# Patient Record
Sex: Male | Born: 1945 | Race: White | Hispanic: No | Marital: Married | Smoking: Never smoker
Health system: Southern US, Community
[De-identification: ages and names within clinical notes are randomized; demographics above are authoritative.]

## PROBLEM LIST (undated history)

## (undated) DIAGNOSIS — G43909 Migraine, unspecified, not intractable, without status migrainosus: Secondary | ICD-10-CM

## (undated) DIAGNOSIS — M7752 Other enthesopathy of left foot: Secondary | ICD-10-CM

## (undated) DIAGNOSIS — C801 Malignant (primary) neoplasm, unspecified: Secondary | ICD-10-CM

## (undated) DIAGNOSIS — Z87442 Personal history of urinary calculi: Secondary | ICD-10-CM

## (undated) DIAGNOSIS — T753XXA Motion sickness, initial encounter: Secondary | ICD-10-CM

## (undated) DIAGNOSIS — B009 Herpesviral infection, unspecified: Secondary | ICD-10-CM

## (undated) DIAGNOSIS — I10 Essential (primary) hypertension: Secondary | ICD-10-CM

## (undated) DIAGNOSIS — N400 Enlarged prostate without lower urinary tract symptoms: Secondary | ICD-10-CM

## (undated) DIAGNOSIS — Z8614 Personal history of Methicillin resistant Staphylococcus aureus infection: Secondary | ICD-10-CM

## (undated) DIAGNOSIS — N200 Calculus of kidney: Secondary | ICD-10-CM

## (undated) DIAGNOSIS — M1712 Unilateral primary osteoarthritis, left knee: Secondary | ICD-10-CM

## (undated) DIAGNOSIS — T4145XA Adverse effect of unspecified anesthetic, initial encounter: Secondary | ICD-10-CM

## (undated) DIAGNOSIS — F32A Depression, unspecified: Secondary | ICD-10-CM

## (undated) DIAGNOSIS — L570 Actinic keratosis: Secondary | ICD-10-CM

## (undated) DIAGNOSIS — M1990 Unspecified osteoarthritis, unspecified site: Secondary | ICD-10-CM

## (undated) DIAGNOSIS — T8859XA Other complications of anesthesia, initial encounter: Secondary | ICD-10-CM

## (undated) DIAGNOSIS — Z9889 Other specified postprocedural states: Secondary | ICD-10-CM

## (undated) DIAGNOSIS — M48061 Spinal stenosis, lumbar region without neurogenic claudication: Secondary | ICD-10-CM

## (undated) DIAGNOSIS — R112 Nausea with vomiting, unspecified: Secondary | ICD-10-CM

## (undated) DIAGNOSIS — F329 Major depressive disorder, single episode, unspecified: Secondary | ICD-10-CM

## (undated) HISTORY — DX: Malignant (primary) neoplasm, unspecified: C80.1

## (undated) HISTORY — DX: Other specified postprocedural states: Z98.890

## (undated) HISTORY — PX: ELBOW SURGERY: SHX618

## (undated) HISTORY — DX: Herpesviral infection, unspecified: B00.9

## (undated) HISTORY — PX: TONSILLECTOMY: SUR1361

## (undated) HISTORY — DX: Actinic keratosis: L57.0

## (undated) HISTORY — PX: BACK SURGERY: SHX140

## (undated) HISTORY — DX: Essential (primary) hypertension: I10

## (undated) HISTORY — DX: Personal history of Methicillin resistant Staphylococcus aureus infection: Z86.14

## (undated) HISTORY — DX: Calculus of kidney: N20.0

## (undated) HISTORY — DX: Other enthesopathy of left foot and ankle: M77.52

## (undated) HISTORY — PX: NECK SURGERY: SHX720

## (undated) HISTORY — PX: APPENDECTOMY: SHX54

---

## 1999-11-09 DIAGNOSIS — I10 Essential (primary) hypertension: Secondary | ICD-10-CM | POA: Insufficient documentation

## 2002-11-08 DIAGNOSIS — Z8614 Personal history of Methicillin resistant Staphylococcus aureus infection: Secondary | ICD-10-CM

## 2002-11-08 HISTORY — DX: Personal history of Methicillin resistant Staphylococcus aureus infection: Z86.14

## 2007-09-15 ENCOUNTER — Ambulatory Visit: Payer: Self-pay | Admitting: Urology

## 2008-01-12 DIAGNOSIS — L0202 Furuncle of face: Secondary | ICD-10-CM | POA: Insufficient documentation

## 2008-11-08 HISTORY — PX: OTHER SURGICAL HISTORY: SHX169

## 2008-12-03 DIAGNOSIS — M545 Low back pain, unspecified: Secondary | ICD-10-CM | POA: Insufficient documentation

## 2008-12-03 DIAGNOSIS — M5417 Radiculopathy, lumbosacral region: Secondary | ICD-10-CM | POA: Insufficient documentation

## 2008-12-03 DIAGNOSIS — M5416 Radiculopathy, lumbar region: Secondary | ICD-10-CM | POA: Insufficient documentation

## 2009-01-01 ENCOUNTER — Ambulatory Visit: Payer: Self-pay | Admitting: Specialist

## 2009-02-17 ENCOUNTER — Ambulatory Visit: Payer: Self-pay | Admitting: Anesthesiology

## 2009-02-28 ENCOUNTER — Ambulatory Visit: Payer: Self-pay | Admitting: Anesthesiology

## 2009-02-28 ENCOUNTER — Ambulatory Visit: Payer: Self-pay | Admitting: Family Medicine

## 2009-03-28 ENCOUNTER — Ambulatory Visit: Payer: Self-pay | Admitting: Anesthesiology

## 2009-05-08 DIAGNOSIS — Z8619 Personal history of other infectious and parasitic diseases: Secondary | ICD-10-CM | POA: Insufficient documentation

## 2009-05-08 DIAGNOSIS — B009 Herpesviral infection, unspecified: Secondary | ICD-10-CM | POA: Insufficient documentation

## 2009-06-09 ENCOUNTER — Ambulatory Visit: Payer: Self-pay | Admitting: Anesthesiology

## 2009-08-11 ENCOUNTER — Ambulatory Visit: Payer: Self-pay | Admitting: Anesthesiology

## 2009-09-12 ENCOUNTER — Ambulatory Visit: Payer: Self-pay | Admitting: Anesthesiology

## 2009-11-10 ENCOUNTER — Ambulatory Visit: Payer: Self-pay | Admitting: Anesthesiology

## 2009-12-09 ENCOUNTER — Ambulatory Visit: Payer: Self-pay | Admitting: Anesthesiology

## 2010-01-12 ENCOUNTER — Ambulatory Visit: Payer: Self-pay | Admitting: Anesthesiology

## 2010-02-06 ENCOUNTER — Ambulatory Visit: Payer: Self-pay | Admitting: Anesthesiology

## 2010-03-19 ENCOUNTER — Ambulatory Visit: Payer: Self-pay | Admitting: Anesthesiology

## 2010-05-05 ENCOUNTER — Inpatient Hospital Stay (HOSPITAL_COMMUNITY): Admission: RE | Admit: 2010-05-05 | Discharge: 2010-05-08 | Payer: Self-pay | Admitting: Neurosurgery

## 2011-01-24 LAB — TYPE AND SCREEN: ABO/RH(D): O POS

## 2011-01-24 LAB — URINALYSIS, ROUTINE W REFLEX MICROSCOPIC
Hgb urine dipstick: NEGATIVE
Nitrite: NEGATIVE
Specific Gravity, Urine: 1.023 (ref 1.005–1.030)

## 2011-01-24 LAB — CBC
MCV: 96.7 fL (ref 78.0–100.0)
WBC: 6.6 10*3/uL (ref 4.0–10.5)

## 2011-01-24 LAB — COMPREHENSIVE METABOLIC PANEL
AST: 20 U/L (ref 0–37)
Alkaline Phosphatase: 65 U/L (ref 39–117)
Chloride: 102 mEq/L (ref 96–112)
Glucose, Bld: 106 mg/dL — ABNORMAL HIGH (ref 70–99)
Sodium: 139 mEq/L (ref 135–145)
Total Bilirubin: 0.8 mg/dL (ref 0.3–1.2)

## 2011-01-24 LAB — SURGICAL PCR SCREEN
MRSA, PCR: NEGATIVE
Staphylococcus aureus: NEGATIVE

## 2011-01-24 LAB — APTT: aPTT: 29 seconds (ref 24–37)

## 2011-01-24 LAB — ABO/RH: ABO/RH(D): O POS

## 2011-01-24 LAB — DIFFERENTIAL
Basophils Relative: 1 % (ref 0–1)
Lymphs Abs: 2.2 10*3/uL (ref 0.7–4.0)
Monocytes Absolute: 0.6 10*3/uL (ref 0.1–1.0)

## 2011-01-24 LAB — PROTIME-INR: Prothrombin Time: 12.4 seconds (ref 11.6–15.2)

## 2011-06-03 ENCOUNTER — Ambulatory Visit: Payer: Self-pay | Admitting: Specialist

## 2011-09-11 DIAGNOSIS — R519 Headache, unspecified: Secondary | ICD-10-CM | POA: Insufficient documentation

## 2011-11-03 ENCOUNTER — Ambulatory Visit: Payer: Self-pay | Admitting: Specialist

## 2011-11-10 ENCOUNTER — Ambulatory Visit: Payer: Self-pay | Admitting: Specialist

## 2011-11-10 DIAGNOSIS — N400 Enlarged prostate without lower urinary tract symptoms: Secondary | ICD-10-CM | POA: Diagnosis not present

## 2011-11-10 DIAGNOSIS — M25819 Other specified joint disorders, unspecified shoulder: Secondary | ICD-10-CM | POA: Diagnosis not present

## 2011-11-10 DIAGNOSIS — Z5331 Laparoscopic surgical procedure converted to open procedure: Secondary | ICD-10-CM | POA: Diagnosis not present

## 2011-11-10 DIAGNOSIS — Z8614 Personal history of Methicillin resistant Staphylococcus aureus infection: Secondary | ICD-10-CM | POA: Diagnosis not present

## 2011-11-10 DIAGNOSIS — R42 Dizziness and giddiness: Secondary | ICD-10-CM | POA: Diagnosis not present

## 2011-11-10 DIAGNOSIS — M19019 Primary osteoarthritis, unspecified shoulder: Secondary | ICD-10-CM | POA: Diagnosis not present

## 2011-11-10 DIAGNOSIS — I10 Essential (primary) hypertension: Secondary | ICD-10-CM | POA: Diagnosis not present

## 2011-11-10 DIAGNOSIS — M75100 Unspecified rotator cuff tear or rupture of unspecified shoulder, not specified as traumatic: Secondary | ICD-10-CM | POA: Diagnosis not present

## 2011-11-10 DIAGNOSIS — Z79899 Other long term (current) drug therapy: Secondary | ICD-10-CM | POA: Diagnosis not present

## 2011-11-10 DIAGNOSIS — Z7982 Long term (current) use of aspirin: Secondary | ICD-10-CM | POA: Diagnosis not present

## 2011-11-10 HISTORY — PX: SHOULDER SURGERY: SHX246

## 2011-11-17 DIAGNOSIS — L570 Actinic keratosis: Secondary | ICD-10-CM | POA: Diagnosis not present

## 2011-11-23 DIAGNOSIS — M75100 Unspecified rotator cuff tear or rupture of unspecified shoulder, not specified as traumatic: Secondary | ICD-10-CM | POA: Diagnosis not present

## 2011-11-23 DIAGNOSIS — M19019 Primary osteoarthritis, unspecified shoulder: Secondary | ICD-10-CM | POA: Diagnosis not present

## 2011-11-26 DIAGNOSIS — M19019 Primary osteoarthritis, unspecified shoulder: Secondary | ICD-10-CM | POA: Diagnosis not present

## 2011-11-26 DIAGNOSIS — M75100 Unspecified rotator cuff tear or rupture of unspecified shoulder, not specified as traumatic: Secondary | ICD-10-CM | POA: Diagnosis not present

## 2011-11-30 DIAGNOSIS — M75100 Unspecified rotator cuff tear or rupture of unspecified shoulder, not specified as traumatic: Secondary | ICD-10-CM | POA: Diagnosis not present

## 2011-11-30 DIAGNOSIS — M19019 Primary osteoarthritis, unspecified shoulder: Secondary | ICD-10-CM | POA: Diagnosis not present

## 2011-12-03 DIAGNOSIS — M75100 Unspecified rotator cuff tear or rupture of unspecified shoulder, not specified as traumatic: Secondary | ICD-10-CM | POA: Diagnosis not present

## 2011-12-03 DIAGNOSIS — M19019 Primary osteoarthritis, unspecified shoulder: Secondary | ICD-10-CM | POA: Diagnosis not present

## 2011-12-06 DIAGNOSIS — M75100 Unspecified rotator cuff tear or rupture of unspecified shoulder, not specified as traumatic: Secondary | ICD-10-CM | POA: Diagnosis not present

## 2011-12-06 DIAGNOSIS — M19019 Primary osteoarthritis, unspecified shoulder: Secondary | ICD-10-CM | POA: Diagnosis not present

## 2011-12-10 DIAGNOSIS — M19019 Primary osteoarthritis, unspecified shoulder: Secondary | ICD-10-CM | POA: Diagnosis not present

## 2011-12-10 DIAGNOSIS — M75100 Unspecified rotator cuff tear or rupture of unspecified shoulder, not specified as traumatic: Secondary | ICD-10-CM | POA: Diagnosis not present

## 2011-12-13 DIAGNOSIS — M75100 Unspecified rotator cuff tear or rupture of unspecified shoulder, not specified as traumatic: Secondary | ICD-10-CM | POA: Diagnosis not present

## 2011-12-13 DIAGNOSIS — M19019 Primary osteoarthritis, unspecified shoulder: Secondary | ICD-10-CM | POA: Diagnosis not present

## 2011-12-17 DIAGNOSIS — M75100 Unspecified rotator cuff tear or rupture of unspecified shoulder, not specified as traumatic: Secondary | ICD-10-CM | POA: Diagnosis not present

## 2011-12-17 DIAGNOSIS — M19019 Primary osteoarthritis, unspecified shoulder: Secondary | ICD-10-CM | POA: Diagnosis not present

## 2011-12-20 DIAGNOSIS — L570 Actinic keratosis: Secondary | ICD-10-CM | POA: Diagnosis not present

## 2011-12-21 DIAGNOSIS — M19019 Primary osteoarthritis, unspecified shoulder: Secondary | ICD-10-CM | POA: Diagnosis not present

## 2011-12-21 DIAGNOSIS — M75100 Unspecified rotator cuff tear or rupture of unspecified shoulder, not specified as traumatic: Secondary | ICD-10-CM | POA: Diagnosis not present

## 2011-12-22 DIAGNOSIS — S32009A Unspecified fracture of unspecified lumbar vertebra, initial encounter for closed fracture: Secondary | ICD-10-CM | POA: Diagnosis not present

## 2011-12-22 DIAGNOSIS — M545 Low back pain, unspecified: Secondary | ICD-10-CM | POA: Diagnosis not present

## 2011-12-22 DIAGNOSIS — I10 Essential (primary) hypertension: Secondary | ICD-10-CM | POA: Diagnosis not present

## 2011-12-22 DIAGNOSIS — Z23 Encounter for immunization: Secondary | ICD-10-CM | POA: Diagnosis not present

## 2011-12-22 DIAGNOSIS — G4700 Insomnia, unspecified: Secondary | ICD-10-CM | POA: Diagnosis not present

## 2011-12-24 DIAGNOSIS — M19019 Primary osteoarthritis, unspecified shoulder: Secondary | ICD-10-CM | POA: Diagnosis not present

## 2011-12-24 DIAGNOSIS — M75100 Unspecified rotator cuff tear or rupture of unspecified shoulder, not specified as traumatic: Secondary | ICD-10-CM | POA: Diagnosis not present

## 2011-12-27 DIAGNOSIS — M19019 Primary osteoarthritis, unspecified shoulder: Secondary | ICD-10-CM | POA: Diagnosis not present

## 2011-12-27 DIAGNOSIS — M75100 Unspecified rotator cuff tear or rupture of unspecified shoulder, not specified as traumatic: Secondary | ICD-10-CM | POA: Diagnosis not present

## 2011-12-30 DIAGNOSIS — M19019 Primary osteoarthritis, unspecified shoulder: Secondary | ICD-10-CM | POA: Diagnosis not present

## 2011-12-30 DIAGNOSIS — M75100 Unspecified rotator cuff tear or rupture of unspecified shoulder, not specified as traumatic: Secondary | ICD-10-CM | POA: Diagnosis not present

## 2012-01-12 DIAGNOSIS — M19019 Primary osteoarthritis, unspecified shoulder: Secondary | ICD-10-CM | POA: Diagnosis not present

## 2012-01-12 DIAGNOSIS — M75100 Unspecified rotator cuff tear or rupture of unspecified shoulder, not specified as traumatic: Secondary | ICD-10-CM | POA: Diagnosis not present

## 2012-01-31 DIAGNOSIS — L820 Inflamed seborrheic keratosis: Secondary | ICD-10-CM | POA: Diagnosis not present

## 2012-01-31 DIAGNOSIS — L570 Actinic keratosis: Secondary | ICD-10-CM | POA: Diagnosis not present

## 2012-02-14 DIAGNOSIS — N390 Urinary tract infection, site not specified: Secondary | ICD-10-CM | POA: Diagnosis not present

## 2012-02-14 DIAGNOSIS — R300 Dysuria: Secondary | ICD-10-CM | POA: Diagnosis not present

## 2012-02-14 DIAGNOSIS — R110 Nausea: Secondary | ICD-10-CM | POA: Diagnosis not present

## 2012-02-14 DIAGNOSIS — Z23 Encounter for immunization: Secondary | ICD-10-CM | POA: Diagnosis not present

## 2012-02-23 DIAGNOSIS — F329 Major depressive disorder, single episode, unspecified: Secondary | ICD-10-CM | POA: Diagnosis not present

## 2012-02-23 DIAGNOSIS — L570 Actinic keratosis: Secondary | ICD-10-CM | POA: Diagnosis not present

## 2012-02-23 DIAGNOSIS — Z23 Encounter for immunization: Secondary | ICD-10-CM | POA: Diagnosis not present

## 2012-02-23 DIAGNOSIS — G4700 Insomnia, unspecified: Secondary | ICD-10-CM | POA: Diagnosis not present

## 2012-02-27 DIAGNOSIS — F3289 Other specified depressive episodes: Secondary | ICD-10-CM | POA: Diagnosis not present

## 2012-02-27 DIAGNOSIS — F489 Nonpsychotic mental disorder, unspecified: Secondary | ICD-10-CM | POA: Diagnosis not present

## 2012-02-27 LAB — URINALYSIS, COMPLETE
Bacteria: NONE SEEN
Bilirubin,UR: NEGATIVE
Glucose,UR: NEGATIVE mg/dL (ref 0–75)
Ketone: NEGATIVE
RBC,UR: NONE SEEN /HPF (ref 0–5)
Specific Gravity: 1.008 (ref 1.003–1.030)
Squamous Epithelial: NONE SEEN
WBC UR: <1 /HPF (ref 0–5)

## 2012-02-27 LAB — CBC
HCT: 44.9 % (ref 40.0–52.0)
MCH: 31.7 pg (ref 26.0–34.0)
MCHC: 33.6 g/dL (ref 32.0–36.0)
MCV: 94 fL (ref 80–100)
Platelet: 274 10*3/uL (ref 150–440)
RDW: 13 % (ref 11.5–14.5)
WBC: 7.4 10*3/uL (ref 3.8–10.6)

## 2012-02-27 LAB — ETHANOL
Ethanol %: <0.003 % (ref 0.000–0.080)
Ethanol: <3 mg/dL

## 2012-02-27 LAB — COMPREHENSIVE METABOLIC PANEL
Alkaline Phosphatase: 83 U/L (ref 50–136)
Anion Gap: 7 (ref 7–16)
Calcium, Total: 9.1 mg/dL (ref 8.5–10.1)
Co2: 28 mmol/L (ref 21–32)
EGFR (African American): >60
EGFR (Non-African Amer.): >60
Glucose: 100 mg/dL — ABNORMAL HIGH (ref 65–99)
Osmolality: 275 (ref 275–301)
Sodium: 137 mmol/L (ref 136–145)
Total Protein: 7.1 g/dL (ref 6.4–8.2)

## 2012-02-27 LAB — SALICYLATE LEVEL: Salicylates, Serum: <1.7 mg/dL

## 2012-02-27 LAB — DRUG SCREEN, URINE
Barbiturates, Ur Screen: NEGATIVE (ref ?–200)
Cannabinoid 50 Ng, Ur ~~LOC~~: NEGATIVE (ref ?–50)
Cocaine Metabolite,Ur ~~LOC~~: NEGATIVE (ref ?–300)
Methadone, Ur Screen: NEGATIVE (ref ?–300)

## 2012-02-27 LAB — TSH: Thyroid Stimulating Horm: 1.02 u[IU]/mL

## 2012-02-28 ENCOUNTER — Inpatient Hospital Stay: Payer: Self-pay | Admitting: Psychiatry

## 2012-02-28 DIAGNOSIS — F411 Generalized anxiety disorder: Secondary | ICD-10-CM | POA: Diagnosis not present

## 2012-02-28 DIAGNOSIS — F489 Nonpsychotic mental disorder, unspecified: Secondary | ICD-10-CM | POA: Diagnosis not present

## 2012-02-28 DIAGNOSIS — R45851 Suicidal ideations: Secondary | ICD-10-CM | POA: Diagnosis not present

## 2012-02-28 DIAGNOSIS — R339 Retention of urine, unspecified: Secondary | ICD-10-CM | POA: Diagnosis present

## 2012-02-28 DIAGNOSIS — G8929 Other chronic pain: Secondary | ICD-10-CM | POA: Diagnosis present

## 2012-02-28 DIAGNOSIS — Z880 Allergy status to penicillin: Secondary | ICD-10-CM | POA: Diagnosis not present

## 2012-02-28 DIAGNOSIS — F329 Major depressive disorder, single episode, unspecified: Secondary | ICD-10-CM | POA: Diagnosis not present

## 2012-02-28 DIAGNOSIS — I10 Essential (primary) hypertension: Secondary | ICD-10-CM | POA: Diagnosis not present

## 2012-02-28 DIAGNOSIS — Z9889 Other specified postprocedural states: Secondary | ICD-10-CM | POA: Diagnosis not present

## 2012-02-28 DIAGNOSIS — G4700 Insomnia, unspecified: Secondary | ICD-10-CM | POA: Diagnosis present

## 2012-02-28 DIAGNOSIS — F064 Anxiety disorder due to known physiological condition: Secondary | ICD-10-CM | POA: Diagnosis not present

## 2012-02-28 DIAGNOSIS — Z79899 Other long term (current) drug therapy: Secondary | ICD-10-CM | POA: Diagnosis not present

## 2012-02-28 DIAGNOSIS — B009 Herpesviral infection, unspecified: Secondary | ICD-10-CM | POA: Diagnosis not present

## 2012-02-28 DIAGNOSIS — N400 Enlarged prostate without lower urinary tract symptoms: Secondary | ICD-10-CM | POA: Diagnosis not present

## 2012-02-28 DIAGNOSIS — Z8781 Personal history of (healed) traumatic fracture: Secondary | ICD-10-CM | POA: Diagnosis not present

## 2012-02-28 DIAGNOSIS — F3289 Other specified depressive episodes: Secondary | ICD-10-CM | POA: Diagnosis not present

## 2012-03-07 DIAGNOSIS — N3010 Interstitial cystitis (chronic) without hematuria: Secondary | ICD-10-CM | POA: Diagnosis not present

## 2012-03-07 DIAGNOSIS — E291 Testicular hypofunction: Secondary | ICD-10-CM | POA: Diagnosis not present

## 2012-03-07 DIAGNOSIS — N410 Acute prostatitis: Secondary | ICD-10-CM | POA: Diagnosis not present

## 2012-03-07 DIAGNOSIS — L821 Other seborrheic keratosis: Secondary | ICD-10-CM | POA: Diagnosis not present

## 2012-03-07 DIAGNOSIS — L570 Actinic keratosis: Secondary | ICD-10-CM | POA: Diagnosis not present

## 2012-03-07 DIAGNOSIS — R338 Other retention of urine: Secondary | ICD-10-CM | POA: Diagnosis not present

## 2012-03-07 DIAGNOSIS — Z79899 Other long term (current) drug therapy: Secondary | ICD-10-CM | POA: Diagnosis not present

## 2012-03-07 DIAGNOSIS — N411 Chronic prostatitis: Secondary | ICD-10-CM | POA: Diagnosis not present

## 2012-03-07 DIAGNOSIS — Z125 Encounter for screening for malignant neoplasm of prostate: Secondary | ICD-10-CM | POA: Diagnosis not present

## 2012-03-07 DIAGNOSIS — Z85828 Personal history of other malignant neoplasm of skin: Secondary | ICD-10-CM | POA: Diagnosis not present

## 2012-03-08 DIAGNOSIS — F329 Major depressive disorder, single episode, unspecified: Secondary | ICD-10-CM | POA: Diagnosis not present

## 2012-03-23 DIAGNOSIS — N321 Vesicointestinal fistula: Secondary | ICD-10-CM | POA: Diagnosis not present

## 2012-03-23 DIAGNOSIS — N400 Enlarged prostate without lower urinary tract symptoms: Secondary | ICD-10-CM | POA: Diagnosis not present

## 2012-03-27 DIAGNOSIS — F329 Major depressive disorder, single episode, unspecified: Secondary | ICD-10-CM | POA: Diagnosis not present

## 2012-04-25 DIAGNOSIS — F329 Major depressive disorder, single episode, unspecified: Secondary | ICD-10-CM | POA: Diagnosis not present

## 2012-05-19 DIAGNOSIS — N411 Chronic prostatitis: Secondary | ICD-10-CM | POA: Diagnosis not present

## 2012-05-19 DIAGNOSIS — R351 Nocturia: Secondary | ICD-10-CM | POA: Diagnosis not present

## 2012-05-19 DIAGNOSIS — N401 Enlarged prostate with lower urinary tract symptoms: Secondary | ICD-10-CM | POA: Diagnosis not present

## 2012-05-19 DIAGNOSIS — R350 Frequency of micturition: Secondary | ICD-10-CM | POA: Diagnosis not present

## 2012-06-20 DIAGNOSIS — F329 Major depressive disorder, single episode, unspecified: Secondary | ICD-10-CM | POA: Diagnosis not present

## 2012-06-26 DIAGNOSIS — L820 Inflamed seborrheic keratosis: Secondary | ICD-10-CM | POA: Diagnosis not present

## 2012-06-26 DIAGNOSIS — Z85828 Personal history of other malignant neoplasm of skin: Secondary | ICD-10-CM | POA: Diagnosis not present

## 2012-06-26 DIAGNOSIS — L570 Actinic keratosis: Secondary | ICD-10-CM | POA: Diagnosis not present

## 2012-06-26 DIAGNOSIS — L821 Other seborrheic keratosis: Secondary | ICD-10-CM | POA: Diagnosis not present

## 2012-06-26 DIAGNOSIS — L578 Other skin changes due to chronic exposure to nonionizing radiation: Secondary | ICD-10-CM | POA: Diagnosis not present

## 2012-08-09 DIAGNOSIS — M549 Dorsalgia, unspecified: Secondary | ICD-10-CM | POA: Diagnosis not present

## 2012-08-09 DIAGNOSIS — M542 Cervicalgia: Secondary | ICD-10-CM | POA: Diagnosis not present

## 2012-08-09 DIAGNOSIS — I10 Essential (primary) hypertension: Secondary | ICD-10-CM | POA: Diagnosis not present

## 2012-08-09 DIAGNOSIS — Z23 Encounter for immunization: Secondary | ICD-10-CM | POA: Diagnosis not present

## 2012-09-26 DIAGNOSIS — L578 Other skin changes due to chronic exposure to nonionizing radiation: Secondary | ICD-10-CM | POA: Diagnosis not present

## 2012-09-26 DIAGNOSIS — L819 Disorder of pigmentation, unspecified: Secondary | ICD-10-CM | POA: Diagnosis not present

## 2012-09-26 DIAGNOSIS — L821 Other seborrheic keratosis: Secondary | ICD-10-CM | POA: Diagnosis not present

## 2012-09-26 DIAGNOSIS — Z85828 Personal history of other malignant neoplasm of skin: Secondary | ICD-10-CM | POA: Diagnosis not present

## 2012-09-26 DIAGNOSIS — L570 Actinic keratosis: Secondary | ICD-10-CM | POA: Diagnosis not present

## 2012-09-26 DIAGNOSIS — L820 Inflamed seborrheic keratosis: Secondary | ICD-10-CM | POA: Diagnosis not present

## 2012-10-16 DIAGNOSIS — L570 Actinic keratosis: Secondary | ICD-10-CM | POA: Diagnosis not present

## 2012-11-13 ENCOUNTER — Ambulatory Visit: Payer: Self-pay | Admitting: Gastroenterology

## 2012-11-13 DIAGNOSIS — Z1211 Encounter for screening for malignant neoplasm of colon: Secondary | ICD-10-CM | POA: Diagnosis not present

## 2012-11-13 DIAGNOSIS — N400 Enlarged prostate without lower urinary tract symptoms: Secondary | ICD-10-CM | POA: Diagnosis not present

## 2012-11-13 DIAGNOSIS — M1990 Unspecified osteoarthritis, unspecified site: Secondary | ICD-10-CM | POA: Diagnosis not present

## 2012-11-13 DIAGNOSIS — I10 Essential (primary) hypertension: Secondary | ICD-10-CM | POA: Diagnosis not present

## 2012-11-13 DIAGNOSIS — Z79899 Other long term (current) drug therapy: Secondary | ICD-10-CM | POA: Diagnosis not present

## 2012-11-13 LAB — HM COLONOSCOPY: HM COLON: NORMAL

## 2012-11-15 DIAGNOSIS — Z23 Encounter for immunization: Secondary | ICD-10-CM | POA: Diagnosis not present

## 2012-11-15 DIAGNOSIS — Z0000 Encounter for general adult medical examination without abnormal findings: Secondary | ICD-10-CM | POA: Diagnosis not present

## 2012-11-15 DIAGNOSIS — M549 Dorsalgia, unspecified: Secondary | ICD-10-CM | POA: Diagnosis not present

## 2012-11-16 DIAGNOSIS — E785 Hyperlipidemia, unspecified: Secondary | ICD-10-CM | POA: Diagnosis not present

## 2012-11-16 DIAGNOSIS — Z125 Encounter for screening for malignant neoplasm of prostate: Secondary | ICD-10-CM | POA: Diagnosis not present

## 2012-11-16 DIAGNOSIS — I10 Essential (primary) hypertension: Secondary | ICD-10-CM | POA: Diagnosis not present

## 2012-11-17 DIAGNOSIS — L570 Actinic keratosis: Secondary | ICD-10-CM | POA: Diagnosis not present

## 2012-11-23 DIAGNOSIS — M75100 Unspecified rotator cuff tear or rupture of unspecified shoulder, not specified as traumatic: Secondary | ICD-10-CM | POA: Diagnosis not present

## 2012-12-14 DIAGNOSIS — N411 Chronic prostatitis: Secondary | ICD-10-CM | POA: Diagnosis not present

## 2012-12-14 DIAGNOSIS — R351 Nocturia: Secondary | ICD-10-CM | POA: Diagnosis not present

## 2012-12-14 DIAGNOSIS — N400 Enlarged prostate without lower urinary tract symptoms: Secondary | ICD-10-CM | POA: Insufficient documentation

## 2012-12-14 DIAGNOSIS — R3915 Urgency of urination: Secondary | ICD-10-CM | POA: Diagnosis not present

## 2012-12-14 DIAGNOSIS — N401 Enlarged prostate with lower urinary tract symptoms: Secondary | ICD-10-CM | POA: Diagnosis not present

## 2013-01-29 DIAGNOSIS — L820 Inflamed seborrheic keratosis: Secondary | ICD-10-CM | POA: Diagnosis not present

## 2013-01-29 DIAGNOSIS — L909 Atrophic disorder of skin, unspecified: Secondary | ICD-10-CM | POA: Diagnosis not present

## 2013-01-29 DIAGNOSIS — L919 Hypertrophic disorder of the skin, unspecified: Secondary | ICD-10-CM | POA: Diagnosis not present

## 2013-01-29 DIAGNOSIS — Z85828 Personal history of other malignant neoplasm of skin: Secondary | ICD-10-CM | POA: Diagnosis not present

## 2013-01-29 DIAGNOSIS — L821 Other seborrheic keratosis: Secondary | ICD-10-CM | POA: Diagnosis not present

## 2013-01-29 DIAGNOSIS — L570 Actinic keratosis: Secondary | ICD-10-CM | POA: Diagnosis not present

## 2013-01-29 DIAGNOSIS — L578 Other skin changes due to chronic exposure to nonionizing radiation: Secondary | ICD-10-CM | POA: Diagnosis not present

## 2013-05-18 DIAGNOSIS — M719 Bursopathy, unspecified: Secondary | ICD-10-CM | POA: Diagnosis not present

## 2013-05-18 DIAGNOSIS — M75100 Unspecified rotator cuff tear or rupture of unspecified shoulder, not specified as traumatic: Secondary | ICD-10-CM | POA: Diagnosis not present

## 2013-05-18 DIAGNOSIS — M67919 Unspecified disorder of synovium and tendon, unspecified shoulder: Secondary | ICD-10-CM | POA: Diagnosis not present

## 2013-07-17 DIAGNOSIS — L821 Other seborrheic keratosis: Secondary | ICD-10-CM | POA: Diagnosis not present

## 2013-07-17 DIAGNOSIS — L578 Other skin changes due to chronic exposure to nonionizing radiation: Secondary | ICD-10-CM | POA: Diagnosis not present

## 2013-07-17 DIAGNOSIS — Z85828 Personal history of other malignant neoplasm of skin: Secondary | ICD-10-CM | POA: Diagnosis not present

## 2013-07-17 DIAGNOSIS — L819 Disorder of pigmentation, unspecified: Secondary | ICD-10-CM | POA: Diagnosis not present

## 2013-07-17 DIAGNOSIS — L570 Actinic keratosis: Secondary | ICD-10-CM | POA: Diagnosis not present

## 2013-07-17 DIAGNOSIS — L280 Lichen simplex chronicus: Secondary | ICD-10-CM | POA: Diagnosis not present

## 2013-07-26 DIAGNOSIS — H2510 Age-related nuclear cataract, unspecified eye: Secondary | ICD-10-CM | POA: Diagnosis not present

## 2013-07-26 DIAGNOSIS — H35039 Hypertensive retinopathy, unspecified eye: Secondary | ICD-10-CM | POA: Diagnosis not present

## 2013-07-31 DIAGNOSIS — I10 Essential (primary) hypertension: Secondary | ICD-10-CM | POA: Diagnosis not present

## 2013-07-31 DIAGNOSIS — Z23 Encounter for immunization: Secondary | ICD-10-CM | POA: Diagnosis not present

## 2013-07-31 DIAGNOSIS — M549 Dorsalgia, unspecified: Secondary | ICD-10-CM | POA: Diagnosis not present

## 2013-08-28 DIAGNOSIS — L570 Actinic keratosis: Secondary | ICD-10-CM | POA: Diagnosis not present

## 2013-09-25 DIAGNOSIS — L570 Actinic keratosis: Secondary | ICD-10-CM | POA: Diagnosis not present

## 2013-10-22 LAB — HM HEPATITIS C SCREENING LAB: HM HEPATITIS C SCREENING: NEGATIVE

## 2013-11-09 DIAGNOSIS — Z85828 Personal history of other malignant neoplasm of skin: Secondary | ICD-10-CM | POA: Diagnosis not present

## 2013-11-09 DIAGNOSIS — L578 Other skin changes due to chronic exposure to nonionizing radiation: Secondary | ICD-10-CM | POA: Diagnosis not present

## 2013-11-09 DIAGNOSIS — L819 Disorder of pigmentation, unspecified: Secondary | ICD-10-CM | POA: Diagnosis not present

## 2013-11-09 DIAGNOSIS — C4491 Basal cell carcinoma of skin, unspecified: Secondary | ICD-10-CM

## 2013-11-09 DIAGNOSIS — D485 Neoplasm of uncertain behavior of skin: Secondary | ICD-10-CM | POA: Diagnosis not present

## 2013-11-09 DIAGNOSIS — L570 Actinic keratosis: Secondary | ICD-10-CM | POA: Diagnosis not present

## 2013-11-09 DIAGNOSIS — C44611 Basal cell carcinoma of skin of unspecified upper limb, including shoulder: Secondary | ICD-10-CM | POA: Diagnosis not present

## 2013-11-09 HISTORY — DX: Basal cell carcinoma of skin, unspecified: C44.91

## 2013-11-15 DIAGNOSIS — L578 Other skin changes due to chronic exposure to nonionizing radiation: Secondary | ICD-10-CM | POA: Diagnosis not present

## 2013-11-15 DIAGNOSIS — L570 Actinic keratosis: Secondary | ICD-10-CM | POA: Diagnosis not present

## 2013-11-21 DIAGNOSIS — Z0000 Encounter for general adult medical examination without abnormal findings: Secondary | ICD-10-CM | POA: Diagnosis not present

## 2013-11-21 DIAGNOSIS — I10 Essential (primary) hypertension: Secondary | ICD-10-CM | POA: Diagnosis not present

## 2013-11-21 DIAGNOSIS — R51 Headache: Secondary | ICD-10-CM | POA: Diagnosis not present

## 2013-11-21 DIAGNOSIS — G4700 Insomnia, unspecified: Secondary | ICD-10-CM | POA: Diagnosis not present

## 2013-11-22 DIAGNOSIS — I10 Essential (primary) hypertension: Secondary | ICD-10-CM | POA: Diagnosis not present

## 2013-11-22 DIAGNOSIS — Z125 Encounter for screening for malignant neoplasm of prostate: Secondary | ICD-10-CM | POA: Diagnosis not present

## 2013-12-14 DIAGNOSIS — L570 Actinic keratosis: Secondary | ICD-10-CM | POA: Diagnosis not present

## 2013-12-14 DIAGNOSIS — C44519 Basal cell carcinoma of skin of other part of trunk: Secondary | ICD-10-CM | POA: Diagnosis not present

## 2013-12-17 DIAGNOSIS — R351 Nocturia: Secondary | ICD-10-CM | POA: Diagnosis not present

## 2013-12-17 DIAGNOSIS — N138 Other obstructive and reflux uropathy: Secondary | ICD-10-CM | POA: Diagnosis not present

## 2013-12-17 DIAGNOSIS — R350 Frequency of micturition: Secondary | ICD-10-CM | POA: Diagnosis not present

## 2013-12-17 DIAGNOSIS — N401 Enlarged prostate with lower urinary tract symptoms: Secondary | ICD-10-CM | POA: Diagnosis not present

## 2014-01-18 DIAGNOSIS — Z85828 Personal history of other malignant neoplasm of skin: Secondary | ICD-10-CM | POA: Diagnosis not present

## 2014-01-18 DIAGNOSIS — L819 Disorder of pigmentation, unspecified: Secondary | ICD-10-CM | POA: Diagnosis not present

## 2014-01-18 DIAGNOSIS — L570 Actinic keratosis: Secondary | ICD-10-CM | POA: Diagnosis not present

## 2014-01-25 DIAGNOSIS — L570 Actinic keratosis: Secondary | ICD-10-CM | POA: Diagnosis not present

## 2014-03-01 DIAGNOSIS — L570 Actinic keratosis: Secondary | ICD-10-CM | POA: Diagnosis not present

## 2014-05-17 DIAGNOSIS — L819 Disorder of pigmentation, unspecified: Secondary | ICD-10-CM | POA: Diagnosis not present

## 2014-05-17 DIAGNOSIS — L570 Actinic keratosis: Secondary | ICD-10-CM | POA: Diagnosis not present

## 2014-05-17 DIAGNOSIS — L578 Other skin changes due to chronic exposure to nonionizing radiation: Secondary | ICD-10-CM | POA: Diagnosis not present

## 2014-05-17 DIAGNOSIS — Z85828 Personal history of other malignant neoplasm of skin: Secondary | ICD-10-CM | POA: Diagnosis not present

## 2014-07-18 DIAGNOSIS — L578 Other skin changes due to chronic exposure to nonionizing radiation: Secondary | ICD-10-CM | POA: Diagnosis not present

## 2014-07-18 DIAGNOSIS — L570 Actinic keratosis: Secondary | ICD-10-CM | POA: Diagnosis not present

## 2014-07-23 DIAGNOSIS — H43819 Vitreous degeneration, unspecified eye: Secondary | ICD-10-CM | POA: Diagnosis not present

## 2014-08-10 DIAGNOSIS — Z23 Encounter for immunization: Secondary | ICD-10-CM | POA: Diagnosis not present

## 2014-10-23 DIAGNOSIS — L578 Other skin changes due to chronic exposure to nonionizing radiation: Secondary | ICD-10-CM | POA: Diagnosis not present

## 2014-10-23 DIAGNOSIS — L570 Actinic keratosis: Secondary | ICD-10-CM | POA: Diagnosis not present

## 2014-11-12 DIAGNOSIS — L570 Actinic keratosis: Secondary | ICD-10-CM | POA: Diagnosis not present

## 2014-11-27 DIAGNOSIS — I10 Essential (primary) hypertension: Secondary | ICD-10-CM | POA: Diagnosis not present

## 2014-11-27 DIAGNOSIS — Z23 Encounter for immunization: Secondary | ICD-10-CM | POA: Diagnosis not present

## 2014-11-27 DIAGNOSIS — G4700 Insomnia, unspecified: Secondary | ICD-10-CM | POA: Diagnosis not present

## 2014-11-27 DIAGNOSIS — Z0000 Encounter for general adult medical examination without abnormal findings: Secondary | ICD-10-CM | POA: Diagnosis not present

## 2014-11-27 DIAGNOSIS — R51 Headache: Secondary | ICD-10-CM | POA: Diagnosis not present

## 2014-11-28 DIAGNOSIS — Z125 Encounter for screening for malignant neoplasm of prostate: Secondary | ICD-10-CM | POA: Diagnosis not present

## 2014-11-28 DIAGNOSIS — I10 Essential (primary) hypertension: Secondary | ICD-10-CM | POA: Diagnosis not present

## 2014-11-28 LAB — PSA: PSA: 0.1

## 2014-11-28 LAB — BASIC METABOLIC PANEL
BUN: 19 mg/dL (ref 4–21)
CREATININE: 1.2 mg/dL (ref 0.6–1.3)
GLUCOSE: 99 mg/dL
Potassium: 4.8 mmol/L (ref 3.4–5.3)
Sodium: 138 mmol/L (ref 137–147)

## 2014-11-28 LAB — LIPID PANEL
Cholesterol: 195 mg/dL (ref 0–200)
HDL: 47 mg/dL (ref 35–70)
LDL CALC: 115 mg/dL
TRIGLYCERIDES: 166 mg/dL — AB (ref 40–160)

## 2014-12-17 DIAGNOSIS — L570 Actinic keratosis: Secondary | ICD-10-CM | POA: Diagnosis not present

## 2015-01-14 DIAGNOSIS — L570 Actinic keratosis: Secondary | ICD-10-CM | POA: Diagnosis not present

## 2015-01-16 DIAGNOSIS — N401 Enlarged prostate with lower urinary tract symptoms: Secondary | ICD-10-CM | POA: Diagnosis not present

## 2015-03-02 NOTE — H&P (Signed)
PATIENT NAME:  Brett Bender, Brett Bender MR#:  [id number] DATE OF BIRTH:  12/30/1945  DATE OF ADMISSION:  02/28/2012  REFERRING PHYSICIAN:   Lenise Arena, MD   ATTENDING PHYSICIAN: Jolanta B. Bary Leriche, MD   IDENTIFYING DATA: [name] is a 69 year old male with no past psychiatric history.   CHIEF COMPLAINT: "I am out of my mind."   HISTORY OF PRESENT ILLNESS: [name] reports that for the past two weeks he has been increasingly depressed and anxious with frequent panic attacks. He also developed suicidal ideation especially in the past 2 to 3 days. He has been preoccupied with thoughts of cutting his wrist or carbon monoxide poisoning in the garage. He feels that he is losing his mind, panicky all the time, unable to sleep, rest, angry at his family, completely unlike himself. He has not been sleeping for an extended period of time in part due to prostate hypertrophy and the necessity to get up 6 or 7 times a night. He believes that Zoloft that was started by his primary physician a few days ago made suicidal ideation as well as anxiety much worse. He, therefore, came to the hospital feeling unsafe.   PAST PSYCHIATRIC HISTORY: He has never been treated for depression or anxiety. No suicidal thoughts, suicidal attempts, no hospitalizations. 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   FAMILY PSYCHIATRIC HISTORY: None reported.   PAST MEDICAL HISTORY:  1. Status post neck fracture.  2. Hypertension.  3. Benign prostatic hypertrophy.  4. Skin condition on his face and chest, possibly herpes.   ALLERGIES: Penicillin.   MEDICATIONS ON ADMISSION:  1. Atenolol/chlorthalidone 50/25, 1/2 tablet daily.  2. Finasteride 5 mg daily.  3. Elmiron 100 mg t.i.d.  4. Rapaflo 8 mg daily. 5. Hydrocodone 5 mg every 4 hours as needed for pain.  6. Etodolac 500 mg b.i.d.   7. Valacyclovir 500 mg every 12  hours as needed.  8. Zyclara cream 3.75% topically to chest. 9. Zoloft 500 mg daily. 10. Doxycycline 100 mg b.i.d.     SOCIAL HISTORY: He is retired. He lives with his wife. 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   LEGAL HISTORY: There is no legal history.    REVIEW OF SYSTEMS: CONSTITUTIONAL: No fevers or chills. No weight changes. EYES: No double or blurred vision. ENT: No hearing loss. RESPIRATORY: No shortness of breath or cough. CARDIOVASCULAR: No chest pain or orthopnea. GASTROINTESTINAL: No abdominal pain, nausea, vomiting, or diarrhea. GU: Positive for prostate hypertrophy with urgency and frequency. ENDOCRINE: No heat or cold intolerance. LYMPHATIC: No anemia or easy bruising. INTEGUMENTARY: Positive for history of rash on the forehead and chest now healed with multiple scars and discolorations. MUSCULOSKELETAL: Positive for neck pain from old injury. NEUROLOGICAL: No tingling or weakness. PSYCHIATRIC: See history of present illness for details.   PHYSICAL EXAMINATION:  VITAL SIGNS: Blood pressure 167/98, pulse 58, respirations 16, temperature 98.1.   GENERAL: This is a well-developed male, anxious-appearing.   HEENT: The pupils are equal, round, and reactive to light. Sclerae are anicteric.   NECK: Supple. No thyromegaly.   LUNGS: Clear to auscultation. No dullness to percussion.   HEART: Regular rhythm and rate. No murmurs, rubs, or gallops.  ABDOMEN: Soft, nontender, nondistended. Positive bowel sounds.   MUSCULOSKELETAL: Normal muscle strength in all extremities.   SKIN: As above, positive for  scars and discoloration.   LYMPHATIC: No cervical adenopathy.   NEUROLOGICAL: Cranial nerves II through XII are intact. Normal gait.   GENITOURINARY: Of note, the patient had a Foley catheter inserted in the Emergency Room and will not give it up.   LABORATORY, DIAGNOSTIC AND RADIOLOGICAL DATA:  Chemistries within normal limits.  Blood  alcohol level zero.  LFTs within normal limits. TSH 1.02.  Urine tox screen negative for substances.  CBC within normal limits.  Urinalysis is not suggestive of urinary tract infection.  Serum acetaminophen and salicylates are low.   MENTAL STATUS EXAMINATION ON ADMISSION: The patient is examined in the Emergency Room. He is alert and oriented to person, place, time, and situation. He is very anxious, restless, panicky but pleasant and cooperative. He is wearing hospital scrubs. He maintains good eye contact. His speech is loud at times. Mood is depressed with anxious affect. Thought processing is logical with its own logic. Thought content: He denies suicidal or homicidal ideation and is able to contract for safety in the hospital but has been preoccupied with thoughts of suicide for the past several days. He is slightly paranoid. There are no auditory or visual hallucinations. His cognition is grossly intact. He registers three out of three and recalls two out of three objects after five minutes. He can spell world forwards and backwards. He can name the current president. His insight and judgment are fair.   SUICIDE RISK ASSESSMENT ON ADMISSION: This is a patient with new onset depression, anxiety and suicidal thoughts.   ASSESSMENT:  AXIS I: Mood disorder, not otherwise specified.   AXIS II: Deferred.   AXIS III:  1. Prostate hypertrophy.  2. Hypertension.  3. Chronic pain.   AXIS V: Mental illness, physical illness.   AXIS V: Global Assessment of Functioning score on admission 25.   PLAN: The patient was admitted to [facility] for safety, stabilization and medication management. He was initially placed on suicide precautions and was closely monitored for any unsafe behaviors. He received pharmacotherapy, individual and group psychotherapy, substance abuse counseling, and support from therapeutic milieu.   1. Suicidal ideation: This has  resolved. The patient is able to contract for safety.  2. Mood: We discontinued Zoloft. We will start Remeron for mood and anxiety with low dose Risperdal and will prescribe Ambien for sleep.  3. Frequent urination: The patient does not want to give up his Foley. We will contact the Urology Service to help [place] make decisions.  4. Disposition: He will be discharged to home.  5. Follow up at the [facility].    ____________________________ Wardell Honour. Bary Leriche, MD jbp:cbb D: 02/29/2012 15:31:46 ET T: 02/29/2012 16:10:22 ET JOB#: [id number]  cc: Jolanta B. Bary Leriche, MD, <Dictator> Clovis Fredrickson MD ELECTRONICALLY SIGNED 03/05/2012 18:48

## 2015-03-02 NOTE — Op Note (Signed)
PATIENT NAMEESTUARDO, Brett Bender MR#:  [id number] DATE OF BIRTH:  10-22-1946  DATE OF PROCEDURE:  11/10/2011  PREOPERATIVE DIAGNOSES:  1. Impingement syndrome, right shoulder.  2. Severe degenerative arthritis acromioclavicular joint, right shoulder.   PROCEDURES PERFORMED:  1. Arthroscopic acromioplasty, right shoulder.  2. Open excision distal right clavicle.   SURGEON: Lucas Mallow, MD   ANESTHESIA: General.   COMPLICATIONS: None.   ESTIMATED BLOOD LOSS: 100 mL.   PROCEDURE: One gram of vancomycin was given intravenously prior to the procedure. General anesthesia was induced. The patient was placed in the left lateral decubitus position in the usual manner for right shoulder surgery. The right shoulder and arm were thoroughly prepped with alcohol and ChloraPrep and draped in standard sterile fashion. First, the area of the distal clavicle is infiltrated with 0.5% Marcaine with epinephrine. Longitudinal incision is made over the distal clavicle and the dissection carried down and the fascia is split exposing the distal 1.5 cm of the clavicle. This was excised piecemeal using the rongeur. Careful palpation at the end of this demonstrates no residual bone. Wound is thoroughly irrigated multiple times. Fascia is closed with 2-0 Vicryl. Skin is closed with 4-0 nylon. Twelve pounds of longitudinal traction is then applied to the right shoulder. Diagnostic arthroscopy is then performed through a posterior portal. The rotator cuff is seen to be intact. The area of the distal clavicle resection is seen to be clear and without complication. There is seen to be a moderate sized anterior acromial spur. This is debrided with the full radial resector and the ArthroWand and then anterior acromionectomy is performed using the 5.5 acromionizer. Saunders Revel is thoroughly irrigated multiple times. Portals are closed with 4-0 nylon. The joint is infiltrated with 20 mL of Marcaine with epinephrine with 4 mg of  morphine. Soft bulky dressing is applied along with a sling.   The patient is returned to the recovery room in satisfactory condition having tolerated the procedure quite well.   ____________________________ Lucas Mallow, MD ces:drc D: 11/10/2011 15:59:50 ET T: 11/10/2011 16:31:48 ET JOB#: [id number]  cc: Lucas Mallow, MD, <Dictator> Lucas Mallow MD ELECTRONICALLY SIGNED 11/12/2011 9:32

## 2015-03-05 DIAGNOSIS — L570 Actinic keratosis: Secondary | ICD-10-CM | POA: Diagnosis not present

## 2015-03-05 DIAGNOSIS — L578 Other skin changes due to chronic exposure to nonionizing radiation: Secondary | ICD-10-CM | POA: Diagnosis not present

## 2015-03-05 DIAGNOSIS — L820 Inflamed seborrheic keratosis: Secondary | ICD-10-CM | POA: Diagnosis not present

## 2015-03-05 DIAGNOSIS — L720 Epidermal cyst: Secondary | ICD-10-CM | POA: Diagnosis not present

## 2015-03-05 DIAGNOSIS — L821 Other seborrheic keratosis: Secondary | ICD-10-CM | POA: Diagnosis not present

## 2015-05-01 ENCOUNTER — Other Ambulatory Visit: Payer: Self-pay | Admitting: Family Medicine

## 2015-05-01 DIAGNOSIS — G4700 Insomnia, unspecified: Secondary | ICD-10-CM | POA: Insufficient documentation

## 2015-05-20 ENCOUNTER — Other Ambulatory Visit: Payer: Self-pay | Admitting: Family Medicine

## 2015-06-17 ENCOUNTER — Other Ambulatory Visit: Payer: Self-pay | Admitting: Family Medicine

## 2015-07-02 DIAGNOSIS — L570 Actinic keratosis: Secondary | ICD-10-CM | POA: Diagnosis not present

## 2015-07-02 DIAGNOSIS — L814 Other melanin hyperpigmentation: Secondary | ICD-10-CM | POA: Diagnosis not present

## 2015-07-02 DIAGNOSIS — L578 Other skin changes due to chronic exposure to nonionizing radiation: Secondary | ICD-10-CM | POA: Diagnosis not present

## 2015-07-02 DIAGNOSIS — L821 Other seborrheic keratosis: Secondary | ICD-10-CM | POA: Diagnosis not present

## 2015-07-02 DIAGNOSIS — Z85828 Personal history of other malignant neoplasm of skin: Secondary | ICD-10-CM | POA: Diagnosis not present

## 2015-07-02 DIAGNOSIS — Z1283 Encounter for screening for malignant neoplasm of skin: Secondary | ICD-10-CM | POA: Diagnosis not present

## 2015-07-02 DIAGNOSIS — L820 Inflamed seborrheic keratosis: Secondary | ICD-10-CM | POA: Diagnosis not present

## 2015-07-24 DIAGNOSIS — H524 Presbyopia: Secondary | ICD-10-CM | POA: Diagnosis not present

## 2015-07-24 DIAGNOSIS — H35033 Hypertensive retinopathy, bilateral: Secondary | ICD-10-CM | POA: Diagnosis not present

## 2015-07-31 ENCOUNTER — Other Ambulatory Visit: Payer: Self-pay | Admitting: Family Medicine

## 2015-07-31 NOTE — Telephone Encounter (Signed)
Please call in butalbital.  

## 2015-08-01 NOTE — Telephone Encounter (Signed)
Rx called in to pharmacy. 

## 2015-08-16 ENCOUNTER — Ambulatory Visit (INDEPENDENT_AMBULATORY_CARE_PROVIDER_SITE_OTHER): Payer: Medicare Other

## 2015-08-16 DIAGNOSIS — Z23 Encounter for immunization: Secondary | ICD-10-CM

## 2015-08-18 DIAGNOSIS — Z23 Encounter for immunization: Secondary | ICD-10-CM

## 2015-09-18 DIAGNOSIS — M7552 Bursitis of left shoulder: Secondary | ICD-10-CM | POA: Diagnosis not present

## 2015-09-22 ENCOUNTER — Other Ambulatory Visit: Payer: Self-pay | Admitting: Family Medicine

## 2015-09-22 DIAGNOSIS — G4700 Insomnia, unspecified: Secondary | ICD-10-CM

## 2015-09-22 MED ORDER — ZOLPIDEM TARTRATE 5 MG PO TABS: 5.0000 mg | ORAL_TABLET | Freq: Every evening | ORAL | Status: DC | PRN

## 2015-10-29 ENCOUNTER — Other Ambulatory Visit: Payer: Self-pay | Admitting: Family Medicine

## 2015-10-29 NOTE — Telephone Encounter (Signed)
OK to call in zolpidem, but patient is due for yearly physical in January which he needs to schedule.

## 2015-10-30 NOTE — Telephone Encounter (Signed)
Rx called into pharmacy. Left message informing pt he is due for cpe appt in January.

## 2015-11-11 ENCOUNTER — Other Ambulatory Visit: Payer: Self-pay | Admitting: Family Medicine

## 2015-11-11 MED ORDER — NORTRIPTYLINE HCL 25 MG PO CAPS: 25.0000 mg | ORAL_CAPSULE | Freq: Two times a day (BID) | ORAL | Status: DC

## 2015-11-11 NOTE — Telephone Encounter (Signed)
Pt contacted office for refill request on the following medications:  Nortriptyline HCL 25mg .  [place]

## 2015-11-11 NOTE — Telephone Encounter (Signed)
Patient requesting refill. Patient last OV was 11/27/2014. Patient has appt scheduled on 11/19/2015.

## 2015-11-12 ENCOUNTER — Other Ambulatory Visit: Payer: Self-pay | Admitting: *Deleted

## 2015-11-12 DIAGNOSIS — L570 Actinic keratosis: Secondary | ICD-10-CM | POA: Diagnosis not present

## 2015-11-12 MED ORDER — NORTRIPTYLINE HCL 25 MG PO CAPS: 25.0000 mg | ORAL_CAPSULE | Freq: Two times a day (BID) | ORAL | Status: DC

## 2015-11-12 NOTE — Telephone Encounter (Signed)
Resent rx for nortriptyline to [facility] on [address] rx that was sent to [address]

## 2015-11-18 DIAGNOSIS — M542 Cervicalgia: Secondary | ICD-10-CM | POA: Insufficient documentation

## 2015-11-18 DIAGNOSIS — L0390 Cellulitis, unspecified: Secondary | ICD-10-CM | POA: Insufficient documentation

## 2015-11-18 DIAGNOSIS — B351 Tinea unguium: Secondary | ICD-10-CM | POA: Insufficient documentation

## 2015-11-18 DIAGNOSIS — B9562 Methicillin resistant Staphylococcus aureus infection as the cause of diseases classified elsewhere: Secondary | ICD-10-CM | POA: Insufficient documentation

## 2015-11-18 DIAGNOSIS — C4491 Basal cell carcinoma of skin, unspecified: Secondary | ICD-10-CM | POA: Insufficient documentation

## 2015-11-19 ENCOUNTER — Encounter: Payer: Self-pay | Admitting: Family Medicine

## 2015-11-19 ENCOUNTER — Ambulatory Visit (INDEPENDENT_AMBULATORY_CARE_PROVIDER_SITE_OTHER): Payer: Medicare Other | Admitting: Family Medicine

## 2015-11-19 VITALS — BP 148/82 | HR 56 | Temp 98.1°F | Resp 18

## 2015-11-19 DIAGNOSIS — G4700 Insomnia, unspecified: Secondary | ICD-10-CM | POA: Diagnosis not present

## 2015-11-19 DIAGNOSIS — Z0000 Encounter for general adult medical examination without abnormal findings: Secondary | ICD-10-CM

## 2015-11-19 DIAGNOSIS — N400 Enlarged prostate without lower urinary tract symptoms: Secondary | ICD-10-CM | POA: Diagnosis not present

## 2015-11-19 DIAGNOSIS — I10 Essential (primary) hypertension: Secondary | ICD-10-CM | POA: Diagnosis not present

## 2015-11-19 MED ORDER — ZOLPIDEM TARTRATE 5 MG PO TABS: 5.0000 mg | ORAL_TABLET | Freq: Every evening | ORAL | Status: DC | PRN

## 2015-11-19 MED ORDER — NORTRIPTYLINE HCL 25 MG PO CAPS: 25.0000 mg | ORAL_CAPSULE | Freq: Every day | ORAL | Status: DC

## 2015-11-19 NOTE — Progress Notes (Signed)
Patient: Brett Bender, Male    DOB: [birth year], 70 y.o.   MRN: [id number] Visit Date: 11/19/2015  Today's Provider: Lelon Huh, MD   Chief Complaint  Patient presents with  . Medicare Wellness  . Hypertension    follow up  . Insomnia    follow up   Subjective:    Annual wellness visit Brett Bender is a 70 y.o. male. He feels well. He reports exercising daily. He reports he is sleeping fairly well.  -----------------------------------------------------------  Hypertension, follow-up:  BP Readings from Last 3 Encounters:  11/27/14 126/84    He was last seen for hypertension 1 years ago.  BP at that visit was 126/84. Management since that visit includes no changes. He reports good compliance with treatment. He is not having side effects.  He is exercising. He is not adherent to low salt diet.   Outside blood pressures are not being checked. He is experiencing none.  Patient denies chest pain, chest pressure/discomfort, claudication, dyspnea, exertional chest pressure/discomfort, fatigue, irregular heart beat, lower extremity edema, near-syncope, orthopnea, palpitations, paroxysmal nocturnal dyspnea, syncope and tachypnea.   Cardiovascular risk factors include advanced age (older than 36 for men, 61 for women), hypertension and male gender.  Use of agents associated with hypertension: none.     Weight trend: stable Wt Readings from Last 3 Encounters:  11/27/14 229 lb (103.874 kg)    Current diet: in general, an "unhealthy" diet  ------------------------------------------------------------------------   Insomnia  He presents today for evaluation of insomnia. Last visit was 1 years ago and no changes were made. Current treatment includes Ambien 5mg . Patient reports good compliance with treatment, good tolerance and good symptom control. Patient sleeps an average of 7 hours per night. Insomnia is getting  unchanged.   --------------------------------------------------------------------    Review of Systems  Constitutional: Negative for fever, chills, appetite change and fatigue.  HENT: Negative for congestion, ear pain, hearing loss, nosebleeds and trouble swallowing.   Eyes: Negative for pain and visual disturbance.  Respiratory: Negative for cough, chest tightness and shortness of breath.   Cardiovascular: Negative for chest pain, palpitations and leg swelling.  Gastrointestinal: Negative for nausea, vomiting, abdominal pain, diarrhea, constipation and blood in stool.  Endocrine: Negative for polydipsia, polyphagia and polyuria.  Genitourinary: Negative for dysuria and flank pain.  Musculoskeletal: Positive for neck pain. Negative for myalgias, back pain, joint swelling, arthralgias and neck stiffness.  Skin: Positive for color change (big toe on left foot). Negative for rash and wound.       Itching of big toe on left foot  Neurological: Negative for dizziness, tremors, seizures, speech difficulty, weakness, light-headedness and headaches.  Psychiatric/Behavioral: Negative for behavioral problems, confusion, sleep disturbance, dysphoric mood and decreased concentration. The patient is not nervous/anxious.   All other systems reviewed and are negative.   Social History   Social History  . Marital Status: Married    Spouse Name: N/A  . Number of Children: N/A  . Years of Education: N/A   Occupational History  . Not on file.   Social History Main Topics  . Smoking status: Never Smoker   . Smokeless tobacco: Not on file  . Alcohol Use: No  . Drug Use: No  . Sexual Activity: Not on file   Other Topics Concern  . Not on file   Social History Narrative  . No narrative on file    Past Medical History  Diagnosis Date  . Cancer ([place])  Basal cell carcinoma  . History of MRSA infection   . Herpes simplex   . Hypertension   . Back pain   . Headache      Patient  Active Problem List   Diagnosis Date Noted  . Basal cell carcinoma of skin 11/18/2015  . Onychomycosis of toenail 11/18/2015  . Neck pain 11/18/2015  . Cellulitis due to MRSA 11/18/2015  . Insomnia 05/01/2015  . Headache 09/11/2011  . Uncomplicated herpes simplex 05/08/2009  . LBP (low back pain) 12/03/2008  . Lumbosacral neuritis 12/03/2008  . Carbuncle and furuncle of face 01/12/2008  . Essential (primary) hypertension 11/09/1999    Past Surgical History  Procedure Laterality Date  . Shoulder surgery Right 11/10/2011    arthroscopic surgery . Dr. Tamala Julian, [facility]  . Tonsillectomy    . Lower back surgery    . Neck surgery      Upper neck surgery    His family history is negative for Diabetes, Cancer, and Heart attack.    Previous Medications   ATENOLOL-CHLORTHALIDONE (TENORETIC) 50-25 MG TABLET    Take 0.5 tablets by mouth daily.   BUTALBITAL-ACETAMINOPHEN-CAFFEINE (FIORICET, ESGIC) 50-325-40 MG PER TABLET    TAKE 1-2 TABLET BY MOUTH EVERY SIX HOURS AS NEEDED FOR HEADACHE   CYCLOBENZAPRINE (FLEXERIL) 5 MG TABLET    Take 1 tablet by mouth 2 (two) times daily as needed.   DICLOFENAC (CATAFLAM) 50 MG TABLET    TAKE 1 TABLET BY MOUTH TWICE A DAY   FINASTERIDE (PROSCAR) 5 MG TABLET    Take 1 tablet by mouth daily.   NORTRIPTYLINE (PAMELOR) 25 MG CAPSULE    Take 1 capsule (25 mg total) by mouth 2 (two) times daily.   TERBINAFINE (LAMISIL) 250 MG TABLET    Take 2 tablets by mouth daily. for 7 days of each month   VALACYCLOVIR (VALTREX) 500 MG TABLET    TAKE 1 TABLET EVERY 12 HOURS AS NEEDED   ZOLPIDEM (AMBIEN) 5 MG TABLET    TAKE 1 TO 2 TABLETS BY MOUTH AT BEDTIME AS NEEDED    Patient Care Team: Birdie Sons, MD as PCP - General (Family Medicine) Christophe Louis, MD as Referring Physician (Physical Therapy) Abbie Sons, MD (Urology)     Objective:   Vitals: BP 148/82 mmHg  Pulse 56  Temp(Src) 98.1 F (36.7 C) (Oral)  Resp 18  SpO2 96%  Physical Exam  Activities  of Daily Living In your present state of health, do you have any difficulty performing the following activities: 11/19/2015  Hearing? N  Vision? N  Difficulty concentrating or making decisions? N  Walking or climbing stairs? N  Dressing or bathing? N  Doing errands, shopping? N    Fall Risk Assessment Fall Risk  11/19/2015  Falls in the past year? No     Depression Screen PHQ 2/9 Scores 11/19/2015  PHQ - 2 Score 0     Cognitive Testing - 6-CIT  Correct? Score   What year is it? yes 0 0 or 4  What month is it? yes 0 0 or 3  Memorize:    Pia Mau,  42,  [place],      What time is it? (within 1 hour) yes 0 0 or 3  Count backwards from 20 yes 0 0, 2, or 4  Name the months of the year yes 0 0, 2, or 4  Repeat name & address above no 8 0, 2, 4, 6, 8, or 10  TOTAL SCORE  8/28   Interpretation:  Mild Cognitive Impairment  Normal (0-7) Abnormal (8-28)   Audit-C Alcohol Use Screening  Question Answer Points  How often do you have alcoholic drink? never 0  On days you do drink alcohol, how many drinks do you typically consume? 0 0  How oftey will you drink 6 or more in a total? never 0  Total Score:  0   A score of 3 or more in women, and 4 or more in men indicates increased risk for alcohol abuse, EXCEPT if all of the points are from question 1.     Assessment & Plan:     Annual Wellness Visit  Reviewed patient's Family Medical History Reviewed and updated list of patient's medical providers Assessment of cognitive impairment was done Assessed patient's functional ability Established a written schedule for health screening [place] Completed and Reviewed  Exercise Activities and Dietary recommendations Goals    None      Immunization History  Administered Date(s) Administered  . Influenza, High Dose Seasonal PF 08/18/2015  . Pneumococcal Conjugate-13 11/27/2014  . Pneumococcal Polysaccharide-23 12/22/2011  . Tdap  12/22/2011    Health Maintenance  Topic Date Due  . Hepatitis C Screening  1946-03-05  . ZOSTAVAX  10/31/2006  . INFLUENZA VACCINE  06/08/2016  . TETANUS/TDAP  12/21/2021  . COLONOSCOPY  11/13/2022  . PNA vac Low Risk Adult  Completed      Discussed health benefits of physical activity, and encouraged him to engage in regular exercise appropriate for his age and condition.    ------------------------------------------------------------------------------------------------------------

## 2015-11-20 DIAGNOSIS — I10 Essential (primary) hypertension: Secondary | ICD-10-CM | POA: Diagnosis not present

## 2015-11-21 ENCOUNTER — Telehealth: Payer: Self-pay

## 2015-11-21 LAB — RENAL FUNCTION PANEL
ALBUMIN: 4.4 g/dL (ref 3.6–4.8)
BUN/Creatinine Ratio: 17 (ref 10–22)
BUN: 21 mg/dL (ref 8–27)
CALCIUM: 9.7 mg/dL (ref 8.6–10.2)
CHLORIDE: 98 mmol/L (ref 96–106)
CO2: 27 mmol/L (ref 18–29)
Creatinine, Ser: 1.22 mg/dL (ref 0.76–1.27)
GFR calc Af Amer: 69 mL/min/{1.73_m2} (ref >59)
GFR calc non Af Amer: 60 mL/min/{1.73_m2} (ref >59)
Glucose: 91 mg/dL (ref 65–99)
PHOSPHORUS: 3.1 mg/dL (ref 2.5–4.5)
Potassium: 4.6 mmol/L (ref 3.5–5.2)
Sodium: 138 mmol/L (ref 134–144)

## 2015-11-21 LAB — LIPID PANEL
CHOLESTEROL TOTAL: 202 mg/dL — AB (ref 100–199)
Chol/HDL Ratio: 4 ratio units (ref 0.0–5.0)
HDL: 50 mg/dL (ref >39)
LDL Calculated: 127 mg/dL — ABNORMAL HIGH (ref 0–99)
Triglycerides: 126 mg/dL (ref 0–149)
VLDL Cholesterol Cal: 25 mg/dL (ref 5–40)

## 2015-11-21 NOTE — Telephone Encounter (Signed)
Advised patient as below.  

## 2015-11-21 NOTE — Telephone Encounter (Signed)
[id number] 

## 2015-11-21 NOTE — Telephone Encounter (Signed)
-----   Message from Birdie Sons, MD sent at 11/21/2015  7:43 AM EST ----- Blood sugar, kidney functions, electrolytes and cholesterol are all normal. Check labs yearly.

## 2015-11-24 ENCOUNTER — Ambulatory Visit (INDEPENDENT_AMBULATORY_CARE_PROVIDER_SITE_OTHER): Payer: Medicare Other | Admitting: Urology

## 2015-11-24 ENCOUNTER — Encounter: Payer: Self-pay | Admitting: Urology

## 2015-11-24 VITALS — BP 166/95 | HR 57 | Ht 75.5 in | Wt 227.2 lb

## 2015-11-24 DIAGNOSIS — N401 Enlarged prostate with lower urinary tract symptoms: Secondary | ICD-10-CM

## 2015-11-24 DIAGNOSIS — Z87442 Personal history of urinary calculi: Secondary | ICD-10-CM | POA: Insufficient documentation

## 2015-11-24 DIAGNOSIS — Z87438 Personal history of other diseases of male genital organs: Secondary | ICD-10-CM

## 2015-11-24 DIAGNOSIS — N138 Other obstructive and reflux uropathy: Secondary | ICD-10-CM

## 2015-11-24 DIAGNOSIS — N400 Enlarged prostate without lower urinary tract symptoms: Secondary | ICD-10-CM | POA: Diagnosis not present

## 2015-11-24 LAB — URINALYSIS, COMPLETE
Bilirubin, UA: NEGATIVE
GLUCOSE, UA: NEGATIVE
Ketones, UA: NEGATIVE
LEUKOCYTES UA: NEGATIVE
Nitrite, UA: NEGATIVE
PROTEIN UA: NEGATIVE
SPEC GRAV UA: 1.01 (ref 1.005–1.030)
Urobilinogen, Ur: 0.2 mg/dL (ref 0.2–1.0)
pH, UA: 7 (ref 5.0–7.5)

## 2015-11-24 LAB — MICROSCOPIC EXAMINATION: Bacteria, UA: NONE SEEN

## 2015-11-24 LAB — BLADDER SCAN AMB NON-IMAGING: Scan Result: 0

## 2015-11-24 MED ORDER — TAMSULOSIN HCL 0.4 MG PO CAPS: 0.4000 mg | ORAL_CAPSULE | Freq: Every day | ORAL | Status: DC

## 2015-11-24 MED ORDER — FINASTERIDE 5 MG PO TABS: 5.0000 mg | ORAL_TABLET | Freq: Every day | ORAL | Status: DC

## 2015-11-24 NOTE — Progress Notes (Signed)
11/24/2015 11:28 AM   Brett Bender 1946/04/12 [id number]  Referring provider: Birdie Sons, MD [address] [place], [place] [address]  Chief Complaint  Patient presents with  . Benign Prostatic Hypertrophy    referred by Dr. Caryn Section    HPI: Patient is a 70 year old Caucasian male with a history of nephrolithiasis, chronic prostatitis and BPH with LUTS who presents today as a referral from his PCP, Dr. Caryn Section, to get established with a local urologist.    History of nephrolithiases Patient stated that he had passed a stone about 8 years ago.  He has not had hematuria or flank pain since that time.  His stone composition is unknown.  His UA today is unremarkable.  History of chronic prostatitis Patient is a former patient of [facility] and was being treated for his chronic prostatitis with Elmiron, finasteride and tamsulosin.  He is no longer taking the Elmiron, but he has not had an episode of prostatitis.  He feels that the tamsulosin and finasteride are helping his symptoms.  BPH WITH LUTS His IPSS score today is 16, which is moderate lower urinary tract symptomatology. He is mostly dissatisfied with his quality life due to his urinary symptoms. His PVR is 0 mL.   His major complaint today frequency, but he realizes this is due to the fact that he drinks 8 cups of coffee in the morning and sweet tea with dinner.  He has had these symptoms for several years.  He denies any dysuria, hematuria or suprapubic pain.  He currently taking tamsulosin 0.4 mg daily and finasteride 5 mg daily.   His has had TUMT several years ago.  He also denies any recent fevers, chills, nausea or vomiting.  He does not have a family history of PCa.      IPSS      11/24/15 1000       International Prostate Symptom Score   How often have you had the sensation of not emptying your bladder? More than half the time     How often have you had to urinate less than every two hours?  More than half the time     How often have you found you stopped and started again several times when you urinated? About half the time     How often have you found it difficult to postpone urination? Less than 1 in 5 times     How often have you had a weak urinary stream? Less than half the time     How often have you had to strain to start urination? Less than 1 in 5 times     How many times did you typically get up at night to urinate? 1 Time     Total IPSS Score 16     Quality of Life due to urinary symptoms   If you were to spend the rest of your life with your urinary condition just the way it is now how would you feel about that? Mostly Disatisfied        Score:  1-7 Mild 8-19 Moderate 20-35 Severe     PMH: Past Medical History  Diagnosis Date  . Cancer (HCC)     Basal cell carcinoma  . History of MRSA infection   . Herpes simplex     Surgical History: Past Surgical History  Procedure Laterality Date  . Shoulder surgery Right 11/10/2011    arthroscopic surgery . Dr. Tamala Julian, [facility]  .  Tonsillectomy    . Lower back surgery    . Neck surgery      Upper neck surgery    Home Medications:    Medication List       This list is accurate as of: 11/24/15 11:28 AM.  Always use your most recent med list.               atenolol-chlorthalidone 50-25 MG tablet  Commonly known as:  TENORETIC  Take 0.5 tablets by mouth daily.     butalbital-acetaminophen-caffeine 50-325-40 MG tablet  Commonly known as:  FIORICET, ESGIC  TAKE 1-2 TABLET BY MOUTH EVERY SIX HOURS AS NEEDED FOR HEADACHE     cyclobenzaprine 5 MG tablet  Commonly known as:  FLEXERIL  Take 1 tablet by mouth 2 (two) times daily as needed.     diclofenac 50 MG tablet  Commonly known as:  CATAFLAM  TAKE 1 TABLET BY MOUTH TWICE A DAY     finasteride 5 MG tablet  Commonly known as:  PROSCAR  Take 1 tablet (5 mg total) by mouth daily.     nortriptyline 25 MG capsule  Commonly known as:  PAMELOR  Take 1  capsule (25 mg total) by mouth at bedtime.     tamsulosin 0.4 MG Caps capsule  Commonly known as:  FLOMAX  Take 1 capsule (0.4 mg total) by mouth daily.     terbinafine 250 MG tablet  Commonly known as:  LAMISIL  Take 2 tablets by mouth daily. Reported on 11/24/2015     valACYclovir 500 MG tablet  Commonly known as:  VALTREX  TAKE 1 TABLET EVERY 12 HOURS AS NEEDED     zolpidem 5 MG tablet  Commonly known as:  AMBIEN  Take 1 tablet (5 mg total) by mouth at bedtime as needed.        Allergies:  Allergies  Allergen Reactions  . Penicillins     Other reaction(s): Stomach Ache    Family History: Family History  Problem Relation Age of Onset  . Diabetes Neg Hx   . Cancer Neg Hx   . Heart attack Neg Hx   . Kidney disease Neg Hx   . Prostate cancer Neg Hx     Social History:  reports that he has never smoked. He does not have any smokeless tobacco history on file. He reports that he does not drink alcohol or use illicit drugs.  ROS: UROLOGY Frequent Urination?: Yes Hard to postpone urination?: No Burning/pain with urination?: No Get up at night to urinate?: Yes Leakage of urine?: No Urine stream starts and stops?: No Trouble starting stream?: No Do you have to strain to urinate?: No Blood in urine?: No Urinary tract infection?: No Sexually transmitted disease?: No Injury to kidneys or bladder?: No Painful intercourse?: No Weak stream?: No Erection problems?: No Penile pain?: No  Gastrointestinal Nausea?: No Vomiting?: No Indigestion/heartburn?: No Diarrhea?: No Constipation?: No  Constitutional Fever: No Night sweats?: No Weight loss?: No Fatigue?: No  Skin Skin rash/lesions?: No Itching?: No  Eyes Blurred vision?: No Double vision?: No  Ears/Nose/Throat Sore throat?: No Sinus problems?: No  Hematologic/Lymphatic Swollen glands?: No Easy bruising?: No  Cardiovascular Leg swelling?: No Chest pain?: No  Respiratory Cough?:  No Shortness of breath?: No  Endocrine Excessive thirst?: No  Musculoskeletal Back pain?: No Joint pain?: No  Neurological Headaches?: Yes Dizziness?: No  Psychologic Depression?: No Anxiety?: No  Physical Exam: BP 166/95 mmHg  Pulse 57  Ht 6' 3.5" (  1.918 m)  Wt 227 lb 3.2 oz (103.057 kg)  BMI 28.01 kg/m2  Constitutional: Well nourished. Alert and oriented, No acute distress. HEENT: [place] AT, moist mucus membranes. Trachea midline, no masses. Cardiovascular: No clubbing, cyanosis, or edema. Respiratory: Normal respiratory effort, no increased work of breathing. GI: Abdomen is soft, non tender, non distended, no abdominal masses. Liver and spleen not palpable.  No hernias appreciated.  Stool sample for occult testing is not indicated.   GU: No CVA tenderness.  No bladder fullness or masses.  Patient with circumcised phallus.  Foreskin easily retracted.  Urethral meatus is patent.  No penile discharge. No penile lesions or rashes. Scrotum without lesions, cysts, rashes and/or edema.  Testicles are located scrotally bilaterally. No masses are appreciated in the testicles. Left and right epididymis are normal. Rectal: Patient with  normal sphincter tone. Anus and perineum without scarring or rashes. No rectal masses are appreciated. Prostate is approximately 45 grams, no nodules are appreciated. Seminal vesicles are normal. Skin: No rashes, bruises or suspicious lesions. Lymph: No cervical or inguinal adenopathy. Neurologic: Grossly intact, no focal deficits, moving all 4 extremities. Psychiatric: Normal mood and affect.  Laboratory Data: Lab Results  Component Value Date   WBC 7.4 02/27/2012   HGB 15.1 02/27/2012   HCT 44.9 02/27/2012   MCV 94 02/27/2012   PLT 274 02/27/2012    Lab Results  Component Value Date   CREATININE 1.22 11/20/2015    Lab Results  Component Value Date   PSA 0.1 11/28/2014    Lab Results  Component Value Date   TSH 1.02 02/27/2012     Lab Results  Component Value Date   AST 24 02/27/2012   Lab Results  Component Value Date   ALT 31 02/27/2012   Urinalysis Results for orders placed or performed in visit on 11/24/15  Microscopic Examination  Result Value Ref Range   WBC, UA 0-5 0 -  5 /hpf   RBC, UA 0-2 0 -  2 /hpf   Epithelial Cells (non renal) 0-10 0 - 10 /hpf   Mucus, UA Present (A) Not Estab.   Bacteria, UA None seen None seen/Few  Urinalysis, Complete  Result Value Ref Range   Specific Gravity, UA 1.010 1.005 - 1.030   pH, UA 7.0 5.0 - 7.5   Color, UA Yellow Yellow   Appearance Ur Clear Clear   Leukocytes, UA Negative Negative   Protein, UA Negative Negative/Trace   Glucose, UA Negative Negative   Ketones, UA Negative Negative   RBC, UA 2+ (A) Negative   Bilirubin, UA Negative Negative   Urobilinogen, Ur 0.2 0.2 - 1.0 mg/dL   Nitrite, UA Negative Negative   Microscopic Examination See below:   BLADDER SCAN AMB NON-IMAGING  Result Value Ref Range   Scan Result 0     Pertinent Imaging: Results for Brett Bender, Brett Bender (MRN [id number]) as of 11/24/2015 12:48  Ref. Range 11/24/2015 10:53  Scan Result Unknown 0    Assessment & Plan:    1. History of nephrolithiasis:   Patient passed a stone spontaneously approximately 8 years ago.  He has had no signs of renal colic or hematuria.  His UA today is unremarkable.  He will report any signs of renal colic or hematuria to our office.  We will continue to monitor.  He will RTC in one year for an UA.   - Urinalysis, Complete  2. History of chronic prostatitis:   Patient's symptoms are well controlled with tamsulosin  and finasteride.  He will continue these medications.  Refills are sent to his pharmacy.  We will continue to monitor.  He will RTC in one year for symptom recheck and exam.  3. BPH (benign prostatic hyperplasia) with LUTS:   Patient's IPSS score is 16/4.  He is consuming large amounts of caffeine and he recognizes that this contributes to his  LUTS.  He will continue the tamsulosin and finasteride.  He will RTC in one year for an IPSS score, exam and PSA.    - PSA - BLADDER SCAN AMB NON-IMAGING   Return in about 1 year (around 11/23/2016) for IPSS score and exam.  These notes generated with voice recognition software. I apologize for typographical errors.  Brett Bender, [facility] [address] [place], [place] [address] [phone]

## 2015-11-25 ENCOUNTER — Ambulatory Visit: Payer: Medicare Other | Admitting: Urology

## 2015-11-25 ENCOUNTER — Ambulatory Visit (INDEPENDENT_AMBULATORY_CARE_PROVIDER_SITE_OTHER): Payer: Medicare Other

## 2015-11-25 DIAGNOSIS — N400 Enlarged prostate without lower urinary tract symptoms: Secondary | ICD-10-CM | POA: Diagnosis not present

## 2015-11-25 LAB — URINALYSIS, COMPLETE
Bilirubin, UA: NEGATIVE
GLUCOSE, UA: NEGATIVE
NITRITE UA: NEGATIVE
Specific Gravity, UA: 1.02 (ref 1.005–1.030)
UUROB: 0.2 mg/dL (ref 0.2–1.0)
pH, UA: 7.5 (ref 5.0–7.5)

## 2015-11-25 LAB — PSA: Prostate Specific Ag, Serum: <0.1 ng/mL (ref 0.0–4.0)

## 2015-11-25 LAB — MICROSCOPIC EXAMINATION
Epithelial Cells (non renal): NONE SEEN /hpf (ref 0–10)
RBC, UA: >30 /hpf — ABNORMAL HIGH (ref 0–?)

## 2015-11-25 LAB — BLADDER SCAN AMB NON-IMAGING: SCAN RESULT: 26

## 2015-11-25 NOTE — Progress Notes (Signed)
Bladder Scan Patient can void:  Performed By: Toniann Fail, LPN   Pt called this morning c/o urinary frequency and burning. Pt gave a urine sample for u/a and cx.

## 2015-11-26 ENCOUNTER — Telehealth: Payer: Self-pay

## 2015-11-26 NOTE — Telephone Encounter (Signed)
-----   Message from Nori Riis, PA-C sent at 11/25/2015  8:13 AM EST ----- PSA is stable. We will see him in one year.

## 2015-11-26 NOTE — Telephone Encounter (Signed)
-----   Message from Nori Riis, PA-C sent at 11/25/2015  2:06 PM EST ----- You sent this for culture right?

## 2015-11-26 NOTE — Telephone Encounter (Signed)
Spoke with pt and made aware of lab results. Pt voiced understanding.  

## 2015-11-26 NOTE — Telephone Encounter (Signed)
Culture sent 

## 2015-11-27 ENCOUNTER — Other Ambulatory Visit: Payer: Self-pay | Admitting: Urology

## 2015-11-27 DIAGNOSIS — R3129 Other microscopic hematuria: Secondary | ICD-10-CM

## 2015-11-27 LAB — CULTURE, URINE COMPREHENSIVE

## 2015-11-28 ENCOUNTER — Telehealth: Payer: Self-pay

## 2015-11-28 NOTE — Telephone Encounter (Signed)
I would have him come in for a PVR.  Also, see my other note about the CT.

## 2015-11-28 NOTE — Telephone Encounter (Signed)
Spoke with pt and made aware of negative urine cx. Pt c/o having increased frequency and only dribbling a little bit at a time. Please advise.

## 2015-11-28 NOTE — Telephone Encounter (Signed)
-----   Message from Nori Riis, PA-C sent at 11/27/2015  3:45 PM EST ----- Patient's urine culture is negative.  I would have the patient obtain have a CT Urogram and cystoscopy.

## 2015-11-28 NOTE — Telephone Encounter (Signed)
Spoke with pt in reference to dysuria and CT. Pt denied a PVR due to having one previously this week. Pt voiced understanding of CT.

## 2015-12-03 ENCOUNTER — Ambulatory Visit
Admission: RE | Admit: 2015-12-03 | Discharge: 2015-12-03 | Disposition: A | Discharge status: Home / Self Care | Payer: Medicare Other | Source: Ambulatory Visit | Attending: Urology | Admitting: Urology

## 2015-12-03 DIAGNOSIS — N200 Calculus of kidney: Secondary | ICD-10-CM | POA: Diagnosis not present

## 2015-12-03 DIAGNOSIS — N201 Calculus of ureter: Secondary | ICD-10-CM | POA: Insufficient documentation

## 2015-12-03 DIAGNOSIS — R3129 Other microscopic hematuria: Secondary | ICD-10-CM | POA: Insufficient documentation

## 2015-12-03 DIAGNOSIS — N132 Hydronephrosis with renal and ureteral calculous obstruction: Secondary | ICD-10-CM | POA: Diagnosis not present

## 2015-12-03 MED ORDER — IOHEXOL 300 MG/ML  SOLN
150.0000 mL | Freq: Once | INTRAMUSCULAR | Status: AC | PRN
  Administered 2015-12-03: 125 mL via INTRAVENOUS

## 2015-12-04 ENCOUNTER — Telehealth: Payer: Self-pay

## 2015-12-04 DIAGNOSIS — N200 Calculus of kidney: Secondary | ICD-10-CM

## 2015-12-04 NOTE — Telephone Encounter (Signed)
LMOM

## 2015-12-04 NOTE — Telephone Encounter (Signed)
Spoke with pt and made aware of stone. Pt stated he could come in Monday. Pt was transferred to the front to make f/u appt. KUB orders placed.

## 2015-12-04 NOTE — Telephone Encounter (Signed)
-----   Message from Nori Riis, PA-C sent at 12/04/2015  8:17 AM EST ----- Patient has a stone.  I would like him to come in Monday or Tuesday with a KUB before hand to discuss treatment for the stone.

## 2015-12-08 ENCOUNTER — Ambulatory Visit
Admission: RE | Admit: 2015-12-08 | Discharge: 2015-12-08 | Disposition: A | Discharge status: Home / Self Care | Payer: Medicare Other | Source: Ambulatory Visit | Attending: Urology | Admitting: Urology

## 2015-12-08 ENCOUNTER — Telehealth: Payer: Self-pay | Admitting: Radiology

## 2015-12-08 ENCOUNTER — Encounter: Payer: Self-pay | Admitting: Urology

## 2015-12-08 ENCOUNTER — Ambulatory Visit: Payer: Self-pay

## 2015-12-08 ENCOUNTER — Ambulatory Visit (INDEPENDENT_AMBULATORY_CARE_PROVIDER_SITE_OTHER): Payer: Medicare Other | Admitting: Urology

## 2015-12-08 VITALS — BP 168/102 | HR 57 | Ht 75.5 in | Wt 225.9 lb

## 2015-12-08 DIAGNOSIS — R3129 Other microscopic hematuria: Secondary | ICD-10-CM | POA: Insufficient documentation

## 2015-12-08 DIAGNOSIS — N201 Calculus of ureter: Secondary | ICD-10-CM | POA: Insufficient documentation

## 2015-12-08 DIAGNOSIS — N200 Calculus of kidney: Secondary | ICD-10-CM

## 2015-12-08 DIAGNOSIS — N1330 Unspecified hydronephrosis: Secondary | ICD-10-CM | POA: Diagnosis not present

## 2015-12-08 LAB — URINALYSIS, COMPLETE
Bilirubin, UA: NEGATIVE
Glucose, UA: NEGATIVE
Ketones, UA: NEGATIVE
LEUKOCYTES UA: NEGATIVE
Nitrite, UA: NEGATIVE
PH UA: 7 (ref 5.0–7.5)
PROTEIN UA: NEGATIVE
Specific Gravity, UA: 1.01 (ref 1.005–1.030)
Urobilinogen, Ur: 0.2 mg/dL (ref 0.2–1.0)

## 2015-12-08 LAB — MICROSCOPIC EXAMINATION
Epithelial Cells (non renal): NONE SEEN /hpf (ref 0–10)
RBC, UA: NONE SEEN /hpf (ref 0–?)

## 2015-12-08 NOTE — Telephone Encounter (Signed)
Notified pt of surgery scheduled 01/05/16, pre-admit testing appt on 2/16 @9 :45 and to call Friday prior to surgery for arrival time to SDS. Pt voices understanding.

## 2015-12-08 NOTE — Progress Notes (Signed)
12/08/2015 4:50 PM   [name] 1946-09-05 [id number]  Referring provider: Birdie Sons, MD [address] [place], [place] [address]  Chief Complaint  Patient presents with  . Nephrolithiasis    KUB prior    HPI: Patient is 70 year old Caucasian male who was recently seen for a routine visit on 11/24/2015 and the next day he contacted the office complaining of urinary frequency and burning.  He was instructed to present to the office for urinalysis and urine culture.  His UA had greater than 30 RBC's per high-power field and the urine culture was negative.   He then underwent a CT urogram for further evaluation for the hematuria.  CT urogram demonstrated a left-sided hydronephrosis and hydroureter secondary to a 7 mm distal left ureteral calculi. He also was found to have bilateral nephrolithiasis.  He presents now to discuss definitive treatment for the ureteral stone.  Today, he is still having the frequency and urgency of urination.  His UA is unremarkable.  KUB taken today demonstrates the left UVJ stone.  He is not experiencing dysuria, hematuria or suprapubic pain. He is not experiencing flank pain at this time.  He is currently on tamsulosin 0.4 mg for his BPH with LUTS.  He has not had fevers, chills, nausea or vomiting.  He would like that procedure that would give him the highest probability of ridding himself of the stone.  I have reviewed the CT urogram and KUB with the patient.    PMH: Past Medical History  Diagnosis Date  . History of MRSA infection   . Herpes simplex   . Hypertension   . Cancer (HCC)     Basal cell carcinoma  . Kidney stone     Surgical History: Past Surgical History  Procedure Laterality Date  . Shoulder surgery Right 11/10/2011    arthroscopic surgery . Dr. Tamala Julian, [facility]  . Tonsillectomy    . Lower back surgery    . Neck surgery      Upper neck surgery    Home Medications:    Medication List       This list is  accurate as of: 12/08/15  4:50 PM.  Always use your most recent med list.               atenolol-chlorthalidone 50-25 MG tablet  Commonly known as:  TENORETIC  Take 0.5 tablets by mouth daily.     butalbital-acetaminophen-caffeine 50-325-40 MG tablet  Commonly known as:  FIORICET, ESGIC  TAKE 1-2 TABLET BY MOUTH EVERY SIX HOURS AS NEEDED FOR HEADACHE     cyclobenzaprine 5 MG tablet  Commonly known as:  FLEXERIL  Take 1 tablet by mouth 2 (two) times daily as needed.     diclofenac 50 MG tablet  Commonly known as:  CATAFLAM  TAKE 1 TABLET BY MOUTH TWICE A DAY     finasteride 5 MG tablet  Commonly known as:  PROSCAR  Take 1 tablet (5 mg total) by mouth daily.     nortriptyline 25 MG capsule  Commonly known as:  PAMELOR  Take 1 capsule (25 mg total) by mouth at bedtime.     tamsulosin 0.4 MG Caps capsule  Commonly known as:  FLOMAX  Take 1 capsule (0.4 mg total) by mouth daily.     terbinafine 250 MG tablet  Commonly known as:  LAMISIL  Take 2 tablets by mouth daily. Reported on 12/08/2015     valACYclovir 500 MG tablet  Commonly known as:  VALTREX  TAKE 1 TABLET EVERY 12 HOURS AS NEEDED     zolpidem 5 MG tablet  Commonly known as:  AMBIEN  Take 1 tablet (5 mg total) by mouth at bedtime as needed.        Allergies:  Allergies  Allergen Reactions  . Penicillins     Other reaction(s): Stomach Ache    Family History: Family History  Problem Relation Age of Onset  . Diabetes Neg Hx   . Cancer Neg Hx   . Heart attack Neg Hx   . Kidney disease Neg Hx   . Prostate cancer Neg Hx     Social History:  reports that he has never smoked. He does not have any smokeless tobacco history on file. He reports that he does not drink alcohol or use illicit drugs.  ROS: UROLOGY Frequent Urination?: No Hard to postpone urination?: No Burning/pain with urination?: No Get up at night to urinate?: No Leakage of urine?: No Urine stream starts and stops?: No Trouble starting  stream?: No Do you have to strain to urinate?: No Blood in urine?: No Urinary tract infection?: No Sexually transmitted disease?: No Injury to kidneys or bladder?: No Painful intercourse?: No Weak stream?: No Erection problems?: No Penile pain?: No  Gastrointestinal Nausea?: No Vomiting?: No Indigestion/heartburn?: No Diarrhea?: No Constipation?: No  Constitutional Fever: No Night sweats?: No Weight loss?: No Fatigue?: No  Skin Skin rash/lesions?: No Itching?: No  Eyes Blurred vision?: No Double vision?: No  Ears/Nose/Throat Sore throat?: No Sinus problems?: No  Hematologic/Lymphatic Swollen glands?: No Easy bruising?: No  Cardiovascular Leg swelling?: No Chest pain?: No  Respiratory Cough?: No Shortness of breath?: No  Endocrine Excessive thirst?: No  Musculoskeletal Back pain?: No Joint pain?: No  Neurological Headaches?: Yes Dizziness?: No  Psychologic Depression?: No Anxiety?: No  Physical Exam: BP 168/102 mmHg  Pulse 57  Ht 6' 3.5" (1.918 m)  Wt 225 lb 14.4 oz (102.468 kg)  BMI 27.85 kg/m2  Constitutional: Well nourished. Alert and oriented, No acute distress. HEENT: [place] AT, moist mucus membranes. Trachea midline, no masses. Cardiovascular: No clubbing, cyanosis, or edema. Respiratory: Normal respiratory effort, no increased work of breathing. GI: Abdomen is soft, non tender, non distended, no abdominal masses. Liver and spleen not palpable.  No hernias appreciated.  Stool sample for occult testing is not indicated.   GU: No CVA tenderness.  No bladder fullness or masses.   Skin: No rashes, bruises or suspicious lesions. Lymph: No cervical or inguinal adenopathy. Neurologic: Grossly intact, no focal deficits, moving all 4 extremities. Psychiatric: Normal mood and affect.  Laboratory Data: Lab Results  Component Value Date   WBC 7.4 02/27/2012   HGB 15.1 02/27/2012   HCT 44.9 02/27/2012   MCV 94 02/27/2012   PLT 274  02/27/2012    Lab Results  Component Value Date   CREATININE 1.22 11/20/2015    Lab Results  Component Value Date   PSA 0.1 11/28/2014    No results found for: TESTOSTERONE  No results found for: HGBA1C  Lab Results  Component Value Date   TSH 1.02 02/27/2012    Lab Results  Component Value Date   AST 24 02/27/2012   Lab Results  Component Value Date   ALT 31 02/27/2012    Urinalysis Results for orders placed or performed in visit on 12/08/15  Microscopic Examination  Result Value Ref Range   WBC, UA 0-5 0 -  5 /hpf   RBC,  UA None seen 0 -  2 /hpf   Epithelial Cells (non renal) None seen 0 - 10 /hpf   Mucus, UA Present (A) Not Estab.   Bacteria, UA Few (A) None seen/Few  Urinalysis, Complete  Result Value Ref Range   Specific Gravity, UA 1.010 1.005 - 1.030   pH, UA 7.0 5.0 - 7.5   Color, UA Yellow Yellow   Appearance Ur Clear Clear   Leukocytes, UA Negative Negative   Protein, UA Negative Negative/Trace   Glucose, UA Negative Negative   Ketones, UA Negative Negative   RBC, UA 2+ (A) Negative   Bilirubin, UA Negative Negative   Urobilinogen, Ur 0.2 0.2 - 1.0 mg/dL   Nitrite, UA Negative Negative   Microscopic Examination See below:    Pertinent Imaging: CLINICAL DATA: History of kidney stones. Frequent urination.  EXAM: CT ABDOMEN AND PELVIS WITHOUT AND WITH CONTRAST  TECHNIQUE: Multidetector CT imaging of the abdomen and pelvis was performed following the standard protocol before and following the bolus administration of intravenous contrast.  CONTRAST: 149mL OMNIPAQUE IOHEXOL 300 MG/ML SOLN  COMPARISON: 05/15/2010  FINDINGS: Lower chest: No pleural fluid identified. The lung bases appear clear.  Hepatobiliary: Diffuse hepatic steatosis noted. The gallbladder appears normal. No biliary dilatation.  Pancreas: Normal appearance of the pancreas.  Spleen: The spleen appears normal.  Adrenals/Urinary Tract: Stable left  adrenal nodule measuring 1.6 cm. Bilateral renal calculi are identified. The largest right renal calculus is in the inferior pole of measures 5 mm. The largest left renal calculi is in the midpole measuring 4 mm. There is asymmetric left-sided hydronephrosis and hydroureter. Stone within the distal left ureter measures 7 mm, image 87 of series 2.  Stomach/Bowel: The stomach is within normal limits. The small bowel loops have a normal course and caliber. No obstruction. Normal appearance of the colon.  Vascular/Lymphatic: Calcified atherosclerotic disease involves the abdominal aorta. No aneurysm. No enlarged retroperitoneal or mesenteric adenopathy. No enlarged pelvic or inguinal lymph nodes.  Reproductive: Prostate gland and seminal vesicles are unremarkable.  Other: There is no ascites or focal fluid collections within the abdomen or pelvis. Periumbilical hernia contains fat only.  Musculoskeletal: Spondylosis noted within the lumbar spine. There is an interbody spacer device within the L4-5 disc space. Degenerative disc disease noted at L5-S1.  IMPRESSION: 1. Bilateral nephrolithiasis. 2. Left-sided hydronephrosis and hydroureter secondary to 7 mm distal left ureteral calculus.   Electronically Signed  By: Kerby Moors M.D.  On: 12/03/2015 10:16  CLINICAL DATA: Kidney stones  EXAM: ABDOMEN - 1 VIEW  COMPARISON: CT abdomen pelvis 12/03/2015  FINDINGS: Small bilateral renal calculi are partially obscured by overlying stool. 7 mm stone distal left ureter unchanged from the CT.  Constipation with stool throughout the colon. Negative for bowel obstruction.  IMPRESSION: Small bilateral renal calculi. 7 mm stone distal left ureter unchanged from the CT.   Electronically Signed  By: Franchot Gallo M.D.  On: 12/08/2015 11:56  Assessment & Plan:    1. Left ureteral stone:   Patient has a left 7 mm distal ureteral stone and a 4 mm left midpole  stone he would like addressed.  Patient has already been on medical expulsive therapy inadvertently with his tamsulosin for his BPH.  The stones have not passed.   I discussed ESWL versus URS/LL/stent placement with the patient.   He would like to undergo the URS/LL/stent placement as it has the higher probability of ridding him of his stone burden.    I explained  to the patient how the procedure is performed and the risks involved.   I informed patient that he may have a stent placed during the procedure and will remain in place after the procedure for a short time.  It will be removed in the office with a cystoscope, unless a string in left in place.  I explained to the patient that the stent  can  "rub" on the inside of the bladder, causing a feeling of needing to urinate/overactive bladder, but also the stent allows urine to pass up from the bladder to the kidney during urination - causing symptoms from a warm, tingling sensation to intense pain in the affected flank.  There may be residual stones within the ureter after the procedure that will need to be addressed at a later time.  Injury to the ureter is the most common intra-operative complication during ureteroscopy. The reported risk of perforation ranges greatly, depending on whether it is defined as a complete perforation (0.1-0.7% - think of this as a hole through the entire ureter), a partial perforation (1.6% - a hole nearly through the entire ureter), or mucosal tear/scrape (5% - these are similar to a sore on the inside of the mouth). Almost 100% of these will heal with prolonged stenting (anywhere between 2 - 4 weeks). Should a large perforation occur, your urologist may chose to stop the procedure and return on another day when the ureter has had time to heal.  I also explained the risks of general anesthesia, such as: MI, CVA, paralysis, coma and/or death.  Patient voiced his understanding and wishes to be scheduled for a cystoscopy with a left  ureteroscopy with laser lithotripsy with possible left ureteral stent placement for a left distal ureteral and left midpole stone.    - Urinalysis, Complete - CULTURE, URINE COMPREHENSIVE  2. Microscopic hematuria:    Patient was found to have microscopic hematuria on 11/25/2015. He has completed a CT urogram which demonstrated bilateral nephrolithiasis, left hydronephrosis and a 7 mm left distal ureteral stone.   He will have a cystoscopy during his stone removal procedure to complete the hematuria workup.   3. Left hydronephrosis:   Once patient has received definitive treatment for his 7 mm left UVJ stone he will undergo a renal ultrasound to confirm the hydronephrosis has resolved.    4. Bilateral renal stones:  Patient has one stone in each of his kidneys.  Left renal stone to be addressed at the time of URS.    Return for schedule Left URS/LL/left stent placement.  These notes generated with voice recognition software. I apologize for typographical errors.  Zara Council, [facility] [address] [place], [place] [address] [phone]

## 2015-12-10 LAB — CULTURE, URINE COMPREHENSIVE

## 2015-12-15 ENCOUNTER — Other Ambulatory Visit: Payer: Medicare Other

## 2015-12-17 ENCOUNTER — Other Ambulatory Visit: Payer: Self-pay | Admitting: Family Medicine

## 2015-12-17 ENCOUNTER — Encounter
Admission: RE | Admit: 2015-12-17 | Discharge: 2015-12-17 | Disposition: A | Discharge status: Home / Self Care | Payer: Medicare Other | Source: Ambulatory Visit | Attending: Urology | Admitting: Urology

## 2015-12-17 ENCOUNTER — Telehealth: Payer: Self-pay | Admitting: Radiology

## 2015-12-17 ENCOUNTER — Ambulatory Visit (INDEPENDENT_AMBULATORY_CARE_PROVIDER_SITE_OTHER): Payer: Medicare Other

## 2015-12-17 DIAGNOSIS — R3129 Other microscopic hematuria: Secondary | ICD-10-CM | POA: Diagnosis not present

## 2015-12-17 DIAGNOSIS — N201 Calculus of ureter: Secondary | ICD-10-CM | POA: Diagnosis present

## 2015-12-17 DIAGNOSIS — N200 Calculus of kidney: Secondary | ICD-10-CM | POA: Diagnosis not present

## 2015-12-17 DIAGNOSIS — N132 Hydronephrosis with renal and ureteral calculous obstruction: Secondary | ICD-10-CM | POA: Diagnosis not present

## 2015-12-17 DIAGNOSIS — N134 Hydroureter: Secondary | ICD-10-CM | POA: Diagnosis not present

## 2015-12-17 DIAGNOSIS — Z9889 Other specified postprocedural states: Secondary | ICD-10-CM | POA: Diagnosis not present

## 2015-12-17 DIAGNOSIS — Z79899 Other long term (current) drug therapy: Secondary | ICD-10-CM | POA: Diagnosis not present

## 2015-12-17 DIAGNOSIS — Z880 Allergy status to penicillin: Secondary | ICD-10-CM | POA: Diagnosis not present

## 2015-12-17 DIAGNOSIS — B009 Herpesviral infection, unspecified: Secondary | ICD-10-CM | POA: Diagnosis not present

## 2015-12-17 DIAGNOSIS — R3915 Urgency of urination: Secondary | ICD-10-CM | POA: Diagnosis not present

## 2015-12-17 DIAGNOSIS — Z791 Long term (current) use of non-steroidal anti-inflammatories (NSAID): Secondary | ICD-10-CM | POA: Diagnosis not present

## 2015-12-17 DIAGNOSIS — Z85828 Personal history of other malignant neoplasm of skin: Secondary | ICD-10-CM | POA: Diagnosis not present

## 2015-12-17 DIAGNOSIS — R350 Frequency of micturition: Secondary | ICD-10-CM | POA: Diagnosis not present

## 2015-12-17 DIAGNOSIS — R319 Hematuria, unspecified: Secondary | ICD-10-CM | POA: Diagnosis present

## 2015-12-17 DIAGNOSIS — Z87442 Personal history of urinary calculi: Secondary | ICD-10-CM | POA: Diagnosis not present

## 2015-12-17 DIAGNOSIS — I10 Essential (primary) hypertension: Secondary | ICD-10-CM | POA: Diagnosis not present

## 2015-12-17 DIAGNOSIS — N401 Enlarged prostate with lower urinary tract symptoms: Secondary | ICD-10-CM | POA: Diagnosis not present

## 2015-12-17 DIAGNOSIS — Z8614 Personal history of Methicillin resistant Staphylococcus aureus infection: Secondary | ICD-10-CM | POA: Diagnosis not present

## 2015-12-17 DIAGNOSIS — R001 Bradycardia, unspecified: Secondary | ICD-10-CM | POA: Diagnosis not present

## 2015-12-17 HISTORY — DX: Other complications of anesthesia, initial encounter: T88.59XA

## 2015-12-17 HISTORY — DX: Benign prostatic hyperplasia without lower urinary tract symptoms: N40.0

## 2015-12-17 HISTORY — DX: Unspecified osteoarthritis, unspecified site: M19.90

## 2015-12-17 HISTORY — DX: Adverse effect of unspecified anesthetic, initial encounter: T41.45XA

## 2015-12-17 LAB — CBC
HCT: 45.1 % (ref 40.0–52.0)
Hemoglobin: 15 g/dL (ref 13.0–18.0)
MCH: 31.6 pg (ref 26.0–34.0)
MCHC: 33.4 g/dL (ref 32.0–36.0)
MCV: 94.6 fL (ref 80.0–100.0)
PLATELETS: 213 10*3/uL (ref 150–440)
RBC: 4.76 MIL/uL (ref 4.40–5.90)
RDW: 13.7 % (ref 11.5–14.5)
WBC: 8.1 10*3/uL (ref 3.8–10.6)

## 2015-12-17 LAB — BASIC METABOLIC PANEL
Anion gap: 8 (ref 5–15)
BUN: 20 mg/dL (ref 6–20)
CALCIUM: 9.4 mg/dL (ref 8.9–10.3)
CO2: 29 mmol/L (ref 22–32)
CREATININE: 1.03 mg/dL (ref 0.61–1.24)
Chloride: 101 mmol/L (ref 101–111)
GFR calc Af Amer: >60 mL/min (ref >60)
Glucose, Bld: 105 mg/dL — ABNORMAL HIGH (ref 65–99)
Potassium: 3.8 mmol/L (ref 3.5–5.1)
SODIUM: 138 mmol/L (ref 135–145)

## 2015-12-17 LAB — SURGICAL PCR SCREEN
MRSA, PCR: NEGATIVE
STAPHYLOCOCCUS AUREUS: POSITIVE — AB

## 2015-12-17 LAB — BLADDER SCAN AMB NON-IMAGING: SCAN RESULT: 116

## 2015-12-17 NOTE — Telephone Encounter (Signed)
Pt called stating he feels that the stone is blocking his bladder. He is unable to empty his bladder completely and is it is causing him to have to go to the bathroom often. He is scheduled for URS, LL & stent placement on 2/27. Please advise.

## 2015-12-17 NOTE — Progress Notes (Signed)
Bladder Scan-116 Patient can void: Performed By: Toniann Fail, LPN   Per Dr. Erlene Quan a u/a and cx were done.

## 2015-12-17 NOTE — Patient Instructions (Signed)
  Your procedure is scheduled on: 12/18/15 7:45 am Report to Day Surgery.2nd floor medical mall .  Remember: Instructions that are not followed completely may result in serious medical risk, up to and including death, or upon the discretion of your surgeon and anesthesiologist your surgery may need to be rescheduled.    _x___ 1. Do not eat food or drink liquids after midnight. No gum chewing or hard candies.     ____ 2. No Alcohol for 24 hours before or after surgery.   ____ 3. Bring all medications with you on the day of surgery if instructed.    _x___ 4. Notify your doctor if there is any change in your medical condition     (cold, fever, infections).     Do not wear jewelry, make-up, hairpins, clips or nail polish.  Do not wear lotions, powders, or perfumes. You may wear deodorant.  Do not shave 48 hours prior to surgery. Men may shave face and neck.  Do not bring valuables to the hospital.    [facility] is not responsible for any belongings or valuables.               Contacts, dentures or bridgework may not be worn into surgery.  Leave your suitcase in the car. After surgery it may be brought to your room.  For patients admitted to the hospital, discharge time is determined by your                treatment team.   Patients discharged the day of surgery will not be allowed to drive home.   Please read over the following fact sheets that you were given:   MRSA Information   _x___ Take these medicines the morning of surgery with A SIP OF WATER:    1. atenolol-chlorthalidone (TENORETIC) 50-25 MG tablet  2.   3.   4.  5.  6.  ____ Fleet Enema (as directed)   _x___ Use CHG Soap as directed  ____ Use inhalers on the day of surgery  ____ Stop metformin 2 days prior to surgery    ____ Take 1/2 of usual insulin dose the night before surgery and none on the morning of surgery.   ____ Stop Coumadin/Plavix/aspirin on   _x___ Stop Anti-inflammatories on stop diclofenac  today   ____ Stop supplements until after surgery.    ____ Bring C-Pap to the hospital.

## 2015-12-18 ENCOUNTER — Ambulatory Visit
Admission: RE | Admit: 2015-12-18 | Discharge: 2015-12-18 | Disposition: A | Discharge status: Home / Self Care | Payer: Medicare Other | Source: Ambulatory Visit | Attending: Urology | Admitting: Urology

## 2015-12-18 ENCOUNTER — Ambulatory Visit: Payer: Medicare Other | Admitting: Anesthesiology

## 2015-12-18 ENCOUNTER — Encounter: Payer: Self-pay | Admitting: *Deleted

## 2015-12-18 ENCOUNTER — Encounter
Admission: RE | Disposition: A | Discharge status: Home / Self Care | Payer: Self-pay | Source: Ambulatory Visit | Attending: Urology

## 2015-12-18 DIAGNOSIS — N132 Hydronephrosis with renal and ureteral calculous obstruction: Secondary | ICD-10-CM | POA: Insufficient documentation

## 2015-12-18 DIAGNOSIS — Z8614 Personal history of Methicillin resistant Staphylococcus aureus infection: Secondary | ICD-10-CM | POA: Insufficient documentation

## 2015-12-18 DIAGNOSIS — N134 Hydroureter: Secondary | ICD-10-CM | POA: Insufficient documentation

## 2015-12-18 DIAGNOSIS — R3129 Other microscopic hematuria: Secondary | ICD-10-CM | POA: Insufficient documentation

## 2015-12-18 DIAGNOSIS — R001 Bradycardia, unspecified: Secondary | ICD-10-CM | POA: Insufficient documentation

## 2015-12-18 DIAGNOSIS — I10 Essential (primary) hypertension: Secondary | ICD-10-CM | POA: Insufficient documentation

## 2015-12-18 DIAGNOSIS — R350 Frequency of micturition: Secondary | ICD-10-CM | POA: Diagnosis not present

## 2015-12-18 DIAGNOSIS — N401 Enlarged prostate with lower urinary tract symptoms: Secondary | ICD-10-CM | POA: Insufficient documentation

## 2015-12-18 DIAGNOSIS — Z85828 Personal history of other malignant neoplasm of skin: Secondary | ICD-10-CM | POA: Insufficient documentation

## 2015-12-18 DIAGNOSIS — B009 Herpesviral infection, unspecified: Secondary | ICD-10-CM | POA: Insufficient documentation

## 2015-12-18 DIAGNOSIS — R3915 Urgency of urination: Secondary | ICD-10-CM | POA: Insufficient documentation

## 2015-12-18 DIAGNOSIS — N200 Calculus of kidney: Secondary | ICD-10-CM

## 2015-12-18 DIAGNOSIS — N202 Calculus of kidney with calculus of ureter: Secondary | ICD-10-CM | POA: Diagnosis not present

## 2015-12-18 DIAGNOSIS — R319 Hematuria, unspecified: Secondary | ICD-10-CM | POA: Diagnosis not present

## 2015-12-18 DIAGNOSIS — Z880 Allergy status to penicillin: Secondary | ICD-10-CM | POA: Insufficient documentation

## 2015-12-18 DIAGNOSIS — Z791 Long term (current) use of non-steroidal anti-inflammatories (NSAID): Secondary | ICD-10-CM | POA: Insufficient documentation

## 2015-12-18 DIAGNOSIS — Z9889 Other specified postprocedural states: Secondary | ICD-10-CM | POA: Insufficient documentation

## 2015-12-18 DIAGNOSIS — Z87442 Personal history of urinary calculi: Secondary | ICD-10-CM | POA: Insufficient documentation

## 2015-12-18 DIAGNOSIS — Z79899 Other long term (current) drug therapy: Secondary | ICD-10-CM | POA: Insufficient documentation

## 2015-12-18 HISTORY — PX: CYSTOSCOPY/URETEROSCOPY/HOLMIUM LASER/STENT PLACEMENT: SHX6546

## 2015-12-18 LAB — URINALYSIS, COMPLETE
BILIRUBIN UA: NEGATIVE
Glucose, UA: NEGATIVE
KETONES UA: NEGATIVE
Leukocytes, UA: NEGATIVE
Nitrite, UA: NEGATIVE
PH UA: 7 (ref 5.0–7.5)
RBC UA: NEGATIVE
Specific Gravity, UA: 1.02 (ref 1.005–1.030)
UUROB: 0.2 mg/dL (ref 0.2–1.0)

## 2015-12-18 LAB — MICROSCOPIC EXAMINATION: RBC MICROSCOPIC, UA: NONE SEEN /HPF (ref 0–?)

## 2015-12-18 SURGERY — CYSTOSCOPY/URETEROSCOPY/HOLMIUM LASER/STENT PLACEMENT
Anesthesia: General | Laterality: Left | Wound class: Clean Contaminated

## 2015-12-18 MED ORDER — LACTATED RINGERS IV SOLN
INTRAVENOUS | Status: DC
  Administered 2015-12-18: 08:00:00 via INTRAVENOUS

## 2015-12-18 MED ORDER — LIDOCAINE HCL (CARDIAC) 20 MG/ML IV SOLN
INTRAVENOUS | Status: DC | PRN
  Administered 2015-12-18: 100 mg via INTRAVENOUS

## 2015-12-18 MED ORDER — FENTANYL CITRATE (PF) 100 MCG/2ML IJ SOLN
INTRAMUSCULAR | Status: DC | PRN
  Administered 2015-12-18: 50 ug via INTRAVENOUS

## 2015-12-18 MED ORDER — FAMOTIDINE 20 MG PO TABS
20.0000 mg | ORAL_TABLET | Freq: Once | ORAL | Status: AC
  Administered 2015-12-18: 20 mg via ORAL

## 2015-12-18 MED ORDER — SUCCINYLCHOLINE CHLORIDE 20 MG/ML IJ SOLN
INTRAMUSCULAR | Status: DC | PRN
  Administered 2015-12-18: 100 mg via INTRAVENOUS

## 2015-12-18 MED ORDER — ONDANSETRON HCL 4 MG/2ML IJ SOLN
INTRAMUSCULAR | Status: DC | PRN
  Administered 2015-12-18: 4 mg via INTRAVENOUS

## 2015-12-18 MED ORDER — EPHEDRINE SULFATE 50 MG/ML IJ SOLN
INTRAMUSCULAR | Status: DC | PRN
  Administered 2015-12-18 (×2): 10 mg via INTRAVENOUS

## 2015-12-18 MED ORDER — ROCURONIUM BROMIDE 100 MG/10ML IV SOLN
INTRAVENOUS | Status: DC | PRN
  Administered 2015-12-18: 10 mg via INTRAVENOUS
  Administered 2015-12-18: 30 mg via INTRAVENOUS

## 2015-12-18 MED ORDER — FAMOTIDINE 20 MG PO TABS
ORAL_TABLET | ORAL | Status: AC
  Administered 2015-12-18: 20 mg via ORAL
  Filled 2015-12-18: qty 1

## 2015-12-18 MED ORDER — CIPROFLOXACIN IN D5W 400 MG/200ML IV SOLN
INTRAVENOUS | Status: AC
  Administered 2015-12-18: 400 mg via INTRAVENOUS
  Filled 2015-12-18: qty 200

## 2015-12-18 MED ORDER — OXYCODONE HCL 5 MG PO TABS: 5.0000 mg | ORAL_TABLET | Freq: Once | ORAL | Status: DC | PRN

## 2015-12-18 MED ORDER — CIPROFLOXACIN IN D5W 400 MG/200ML IV SOLN
400.0000 mg | Freq: Two times a day (BID) | INTRAVENOUS | Status: DC
  Administered 2015-12-18: 400 mg via INTRAVENOUS

## 2015-12-18 MED ORDER — FENTANYL CITRATE (PF) 100 MCG/2ML IJ SOLN
25.0000 ug | INTRAMUSCULAR | Status: DC | PRN
  Administered 2015-12-18 (×3): 25 ug via INTRAVENOUS

## 2015-12-18 MED ORDER — FENTANYL CITRATE (PF) 100 MCG/2ML IJ SOLN
INTRAMUSCULAR | Status: AC
  Administered 2015-12-18: 25 ug via INTRAVENOUS
  Filled 2015-12-18: qty 2

## 2015-12-18 MED ORDER — BACITRACIN ZINC 500 UNIT/GM EX OINT
TOPICAL_OINTMENT | CUTANEOUS | Status: AC
  Filled 2015-12-18: qty 28.35

## 2015-12-18 MED ORDER — MIDAZOLAM HCL 5 MG/5ML IJ SOLN
INTRAMUSCULAR | Status: DC | PRN
  Administered 2015-12-18: 2 mg via INTRAVENOUS

## 2015-12-18 MED ORDER — SUGAMMADEX SODIUM 200 MG/2ML IV SOLN
INTRAVENOUS | Status: DC | PRN
  Administered 2015-12-18: 199.6 mg via INTRAVENOUS

## 2015-12-18 MED ORDER — OXYCODONE HCL 5 MG/5ML PO SOLN: 5.0000 mg | Freq: Once | ORAL | Status: DC | PRN

## 2015-12-18 MED ORDER — VASOPRESSIN 20 UNIT/ML IV SOLN
INTRAVENOUS | Status: DC | PRN
  Administered 2015-12-18: 2 [IU] via INTRAVENOUS
  Administered 2015-12-18: 1 [IU] via INTRAVENOUS
  Administered 2015-12-18: 2 [IU] via INTRAVENOUS

## 2015-12-18 MED ORDER — PROPOFOL 10 MG/ML IV BOLUS
INTRAVENOUS | Status: DC | PRN
  Administered 2015-12-18: 150 mg via INTRAVENOUS

## 2015-12-18 MED ORDER — HYDROCODONE-ACETAMINOPHEN 5-325 MG PO TABS: 1.0000 | ORAL_TABLET | Freq: Four times a day (QID) | ORAL | Status: DC | PRN

## 2015-12-18 SURGICAL SUPPLY — 28 items
ADAPTER SCOPE UROLOK II (MISCELLANEOUS) IMPLANT
BAG DRAIN CYSTO-URO LG1000N (MISCELLANEOUS) ×2 IMPLANT
BASKET ZERO TIP 1.9FR (BASKET) IMPLANT
CATH URETL 5X70 OPEN END (CATHETERS) ×2 IMPLANT
CNTNR SPEC 2.5X3XGRAD LEK (MISCELLANEOUS) ×1
CONRAY 43 FOR UROLOGY 50M (MISCELLANEOUS) ×2 IMPLANT
CONT SPEC 4OZ STER OR WHT (MISCELLANEOUS) ×1
CONTAINER SPEC 2.5X3XGRAD LEK (MISCELLANEOUS) ×1 IMPLANT
GLOVE BIO SURGEON STRL SZ 6.5 (GLOVE) ×2 IMPLANT
GLOVE BIO SURGEON STRL SZ7 (GLOVE) ×4 IMPLANT
GOWN STRL REUS W/ TWL LRG LVL3 (GOWN DISPOSABLE) ×2 IMPLANT
GOWN STRL REUS W/TWL LRG LVL3 (GOWN DISPOSABLE) ×2
INTRODUCER DILATOR DOUBLE (INTRODUCER) IMPLANT
KIT RM TURNOVER CYSTO AR (KITS) ×2 IMPLANT
LASER FIBER 200M SMARTSCOPE (Laser) ×2 IMPLANT
PACK CYSTO AR (MISCELLANEOUS) ×2 IMPLANT
PREP PVP WINGED SPONGE (MISCELLANEOUS) ×2 IMPLANT
PUMP SINGLE ACTION SAP (PUMP) IMPLANT
SENSORWIRE 0.038 NOT ANGLED (WIRE) ×2
SET CYSTO W/LG BORE CLAMP LF (SET/KITS/TRAYS/PACK) ×2 IMPLANT
SHEATH URETERAL 12FRX35CM (MISCELLANEOUS) ×2 IMPLANT
SOL .9 NS 3000ML IRR  AL (IV SOLUTION) ×1
SOL .9 NS 3000ML IRR UROMATIC (IV SOLUTION) ×1 IMPLANT
STENT URET 6FRX24 CONTOUR (STENTS) IMPLANT
STENT URET 6FRX26 CONTOUR (STENTS) IMPLANT
SURGILUBE 2OZ TUBE FLIPTOP (MISCELLANEOUS) ×2 IMPLANT
WATER STERILE IRR 1000ML POUR (IV SOLUTION) ×2 IMPLANT
WIRE SENSOR 0.038 NOT ANGLED (WIRE) ×1 IMPLANT

## 2015-12-18 NOTE — Transfer of Care (Signed)
Immediate Anesthesia Transfer of Care Note  Patient: Brett Bender  Procedure(s) Performed: Procedure(s): CYSTOSCOPY/URETEROSCOPY/HOLMIUM LASER LITHOTRIPSY (Left)  Patient Location: PACU  Anesthesia Type:General  Level of Consciousness: awake, alert  and oriented  Airway & Oxygen Therapy: Patient Spontanous Breathing and Patient connected to face mask oxygen  Post-op Assessment: Report given to RN and Post -op Vital signs reviewed and stable  Post vital signs: Reviewed and stable  Last Vitals: 09:49 - 55 hr 97% 16R 129/60 bp Filed Vitals:   12/18/15 0724 12/18/15 0946  BP: 161/108 129/60  Pulse: 56 46  Temp: 35.5 C 36.4 C  Resp: 16 14    Complications: No apparent anesthesia complications

## 2015-12-18 NOTE — Op Note (Signed)
Date of procedure: 12/18/2015  Preoperative diagnosis:  1. Left UVJ stone 2. Left nonobstructing nephrolithiasis   Postoperative diagnosis:  1. Urethral stone 2. Left nonobstructing nephrolithiasis   Procedure: 1. Cystoscopy 2. Extraction of ureteral stone 3. Left ureteroscopy 4. Left retrograde pyelogram 5. Left laser lithotripsy  Surgeon: Hollice Espy, MD  Anesthesia: General  Complications: None  Intraoperative findings: A 7 mm previous left UVJ stone now larger than prostatic urethra, extracted easily. Left upper mid and lower pole stones treated.  EBL: Minimal  Specimens: Stone fragment  Drains: None  Indication: Brett Bender is a 70 y.o. patient with 7 mm left distal UVJ stone along with nonobstructing bilateral nephrolithiasis. He presented to our office yesterday with increasing urinary symptoms and difficulty emptying his bladder. His surgery was expedited any presents for ureteroscopy today.  After reviewing the management options for treatment, he elected to proceed with the above surgical procedure(s). We have discussed the potential benefits and risks of the procedure, side effects of the proposed treatment, the likelihood of the patient achieving the goals of the procedure, and any potential problems that might occur during the procedure or recuperation. Informed consent has been obtained.  Description of procedure:  The patient was taken to the operating room and general anesthesia was induced.  The patient was placed in the dorsal lithotomy position, prepped and draped in the usual sterile fashion, and preoperative antibiotics were administered. A preoperative time-out was performed.   At this point time, a rigid 21 French cystoscope was advanced per urethra towards the bladder. Within the prostatic urethra, the 7 mm stone was identified and somewhat enlarged in place. This was easily pushed back up into the bladder and stent graspers were used to extract the  stone in one piece. This was passed off the table for stone analysis.  The scope was returned back to the bladder and attention was turned to the left ureteral orifice. He was cannulated using a 5 [place] open-ended ureteral catheter. A gentle retrograde pyelogram was performed which revealed a delicate appearing ureter without hydronephrosis or filling defects. A sensor wire was then placed up to level of the kidney without difficulty. A second Super Stiff wire was also advanced up to level of the kidney. A flexible dual lumen ureteroscope was then advanced quite easily over the Super Stiff wire up to level of the kidney leaving the sensor wire in place as a safety wire. There was no resistance and the scope advanced without difficulty. Formal pyeloscopy was performed at each and every calyx was strictly visualized using the previous retropyelogram is a roadmap. 2 stones were encountered one in the midpole calyx and one in the lower pole calyx. A 200  laser fiber was then brought in and using settings of 0.2 J and 53 Hz, the stones were fragmented into sand like particles no larger than the tip of the laser fiber. Once adequately obliterated, the renal pelvic distention was reduced by aspirating the irrigant fluid out of the collecting system thereby decompressing it. The scope was then backed down the length of the ureter directly visualizing along the way. There were no ureteral injuries noted, no obstructing fragments, and no significant edema therefore no stent was deemed necessary. The safety wire was then removed. The bladder was then drained. The scope was removed and the patient was cleaned and dried, repositioned the supine position, reversed anesthesia, taken to the PACU in supine condition.  Plan: Patient will follow-up in 4 weeks with a renal  ultrasound prior.  Hollice Espy, M.D.

## 2015-12-18 NOTE — Anesthesia Procedure Notes (Signed)
Procedure Name: Intubation Date/Time: 12/18/2015 9:53 AM Performed by: Delaney Meigs Pre-anesthesia Checklist: Patient identified, Emergency Drugs available, Suction available, Patient being monitored and Timeout performed Patient Re-evaluated:Patient Re-evaluated prior to inductionOxygen Delivery Method: Circle system utilized Preoxygenation: Pre-oxygenation with 100% oxygen Intubation Type: IV induction Ventilation: Mask ventilation without difficulty Laryngoscope Size: Mac and 3 Grade View: Grade I Tube type: Oral Tube size: 7.5 mm Number of attempts: 1 Airway Equipment and Method: Stylet Placement Confirmation: ETT inserted through vocal cords under direct vision,  positive ETCO2 and breath sounds checked- equal and bilateral Secured at: 22 cm Tube secured with: Tape Dental Injury: Teeth and Oropharynx as per pre-operative assessment

## 2015-12-18 NOTE — Interval H&P Note (Signed)
History and Physical Interval Note:  12/18/2015 8:32 AM  Brett Bender  has presented today for surgery, with the diagnosis of LEFT STONE,HEMATURIA  The various methods of treatment have been discussed with the patient and family. After consideration of risks, benefits and other options for treatment, the patient has consented to  Procedure(s): CYSTOSCOPY/URETEROSCOPY/HOLMIUM LASER/STENT PLACEMENT (Left) as a surgical intervention .  The patient's history has been reviewed, patient examined, no change in status, stable for surgery.  I have reviewed the patient's chart and labs.  Questions were answered to the patient's satisfaction.    RRR CTAB   Hollice Espy

## 2015-12-18 NOTE — H&P (View-Only) (Signed)
12/08/2015 4:50 PM   Brett Bender May 13, 1946 [id number]  Referring provider: Birdie Sons, MD [address] [place],  [address]  Chief Complaint  Patient presents with  . Nephrolithiasis    KUB prior    HPI: Patient is 70 year old Caucasian male who was recently seen for a routine visit on 11/24/2015 and the next day he contacted the office complaining of urinary frequency and burning.  He was instructed to present to the office for urinalysis and urine culture.  His UA had greater than 30 RBC's per high-power field and the urine culture was negative.   He then underwent a CT urogram for further evaluation for the hematuria.  CT urogram demonstrated a left-sided hydronephrosis and hydroureter secondary to a 7 mm distal left ureteral calculi. He also was found to have bilateral nephrolithiasis.  He presents now to discuss definitive treatment for the ureteral stone.  Today, he is still having the frequency and urgency of urination.  His UA is unremarkable.  KUB taken today demonstrates the left UVJ stone.  He is not experiencing dysuria, hematuria or suprapubic pain. He is not experiencing flank pain at this time.  He is currently on tamsulosin 0.4 mg for his BPH with LUTS.  He has not had fevers, chills, nausea or vomiting.  He would like that procedure that would give him the highest probability of ridding himself of the stone.  I have reviewed the CT urogram and KUB with the patient.    PMH: Past Medical History  Diagnosis Date  . History of MRSA infection   . Herpes simplex   . Hypertension   . Cancer (HCC)     Basal cell carcinoma  . Kidney stone     Surgical History: Past Surgical History  Procedure Laterality Date  . Shoulder surgery Right 11/10/2011    arthroscopic surgery . Dr. Tamala Julian, [facility]  . Tonsillectomy    . Lower back surgery    . Neck surgery      Upper neck surgery    Home Medications:    Medication List       This list is  accurate as of: 12/08/15  4:50 PM.  Always use your most recent med list.               atenolol-chlorthalidone 50-25 MG tablet  Commonly known as:  TENORETIC  Take 0.5 tablets by mouth daily.     butalbital-acetaminophen-caffeine 50-325-40 MG tablet  Commonly known as:  FIORICET, ESGIC  TAKE 1-2 TABLET BY MOUTH EVERY SIX HOURS AS NEEDED FOR HEADACHE     cyclobenzaprine 5 MG tablet  Commonly known as:  FLEXERIL  Take 1 tablet by mouth 2 (two) times daily as needed.     diclofenac 50 MG tablet  Commonly known as:  CATAFLAM  TAKE 1 TABLET BY MOUTH TWICE A DAY     finasteride 5 MG tablet  Commonly known as:  PROSCAR  Take 1 tablet (5 mg total) by mouth daily.     nortriptyline 25 MG capsule  Commonly known as:  PAMELOR  Take 1 capsule (25 mg total) by mouth at bedtime.     tamsulosin 0.4 MG Caps capsule  Commonly known as:  FLOMAX  Take 1 capsule (0.4 mg total) by mouth daily.     terbinafine 250 MG tablet  Commonly known as:  LAMISIL  Take 2 tablets by mouth daily. Reported on 12/08/2015     valACYclovir 500 MG tablet  Commonly known as:  VALTREX  TAKE 1 TABLET EVERY 12 HOURS AS NEEDED     zolpidem 5 MG tablet  Commonly known as:  AMBIEN  Take 1 tablet (5 mg total) by mouth at bedtime as needed.        Allergies:  Allergies  Allergen Reactions  . Penicillins     Other reaction(s): Stomach Ache    Family History: Family History  Problem Relation Age of Onset  . Diabetes Neg Hx   . Cancer Neg Hx   . Heart attack Neg Hx   . Kidney disease Neg Hx   . Prostate cancer Neg Hx     Social History:  reports that he has never smoked. He does not have any smokeless tobacco history on file. He reports that he does not drink alcohol or use illicit drugs.  ROS: UROLOGY Frequent Urination?: No Hard to postpone urination?: No Burning/pain with urination?: No Get up at night to urinate?: No Leakage of urine?: No Urine stream starts and stops?: No Trouble starting  stream?: No Do you have to strain to urinate?: No Blood in urine?: No Urinary tract infection?: No Sexually transmitted disease?: No Injury to kidneys or bladder?: No Painful intercourse?: No Weak stream?: No Erection problems?: No Penile pain?: No  Gastrointestinal Nausea?: No Vomiting?: No Indigestion/heartburn?: No Diarrhea?: No Constipation?: No  Constitutional Fever: No Night sweats?: No Weight loss?: No Fatigue?: No  Skin Skin rash/lesions?: No Itching?: No  Eyes Blurred vision?: No Double vision?: No  Ears/Nose/Throat Sore throat?: No Sinus problems?: No  Hematologic/Lymphatic Swollen glands?: No Easy bruising?: No  Cardiovascular Leg swelling?: No Chest pain?: No  Respiratory Cough?: No Shortness of breath?: No  Endocrine Excessive thirst?: No  Musculoskeletal Back pain?: No Joint pain?: No  Neurological Headaches?: Yes Dizziness?: No  Psychologic Depression?: No Anxiety?: No  Physical Exam: BP 168/102 mmHg  Pulse 57  Ht 6' 3.5" (1.918 m)  Wt 225 lb 14.4 oz (102.468 kg)  BMI 27.85 kg/m2  Constitutional: Well nourished. Alert and oriented, No acute distress. HEENT: [place] AT, moist mucus membranes. Trachea midline, no masses. Cardiovascular: No clubbing, cyanosis, or edema. Respiratory: Normal respiratory effort, no increased work of breathing. GI: Abdomen is soft, non tender, non distended, no abdominal masses. Liver and spleen not palpable.  No hernias appreciated.  Stool sample for occult testing is not indicated.   GU: No CVA tenderness.  No bladder fullness or masses.   Skin: No rashes, bruises or suspicious lesions. Lymph: No cervical or inguinal adenopathy. Neurologic: Grossly intact, no focal deficits, moving all 4 extremities. Psychiatric: Normal mood and affect.  Laboratory Data: Lab Results  Component Value Date   WBC 7.4 02/27/2012   HGB 15.1 02/27/2012   HCT 44.9 02/27/2012   MCV 94 02/27/2012   PLT 274  02/27/2012    Lab Results  Component Value Date   CREATININE 1.22 11/20/2015    Lab Results  Component Value Date   PSA 0.1 11/28/2014    No results found for: TESTOSTERONE  No results found for: HGBA1C  Lab Results  Component Value Date   TSH 1.02 02/27/2012    Lab Results  Component Value Date   AST 24 02/27/2012   Lab Results  Component Value Date   ALT 31 02/27/2012    Urinalysis Results for orders placed or performed in visit on 12/08/15  Microscopic Examination  Result Value Ref Range   WBC, UA 0-5 0 -  5 /hpf   RBC,  UA None seen 0 -  2 /hpf   Epithelial Cells (non renal) None seen 0 - 10 /hpf   Mucus, UA Present (A) Not Estab.   Bacteria, UA Few (A) None seen/Few  Urinalysis, Complete  Result Value Ref Range   Specific Gravity, UA 1.010 1.005 - 1.030   pH, UA 7.0 5.0 - 7.5   Color, UA Yellow Yellow   Appearance Ur Clear Clear   Leukocytes, UA Negative Negative   Protein, UA Negative Negative/Trace   Glucose, UA Negative Negative   Ketones, UA Negative Negative   RBC, UA 2+ (A) Negative   Bilirubin, UA Negative Negative   Urobilinogen, Ur 0.2 0.2 - 1.0 mg/dL   Nitrite, UA Negative Negative   Microscopic Examination See below:    Pertinent Imaging: CLINICAL DATA: History of kidney stones. Frequent urination.  EXAM: CT ABDOMEN AND PELVIS WITHOUT AND WITH CONTRAST  TECHNIQUE: Multidetector CT imaging of the abdomen and pelvis was performed following the standard protocol before and following the bolus administration of intravenous contrast.  CONTRAST: 153mL OMNIPAQUE IOHEXOL 300 MG/ML SOLN  COMPARISON: 05/15/2010  FINDINGS: Lower chest: No pleural fluid identified. The lung bases appear clear.  Hepatobiliary: Diffuse hepatic steatosis noted. The gallbladder appears normal. No biliary dilatation.  Pancreas: Normal appearance of the pancreas.  Spleen: The spleen appears normal.  Adrenals/Urinary Tract: Stable left  adrenal nodule measuring 1.6 cm. Bilateral renal calculi are identified. The largest right renal calculus is in the inferior pole of measures 5 mm. The largest left renal calculi is in the midpole measuring 4 mm. There is asymmetric left-sided hydronephrosis and hydroureter. Stone within the distal left ureter measures 7 mm, image 87 of series 2.  Stomach/Bowel: The stomach is within normal limits. The small bowel loops have a normal course and caliber. No obstruction. Normal appearance of the colon.  Vascular/Lymphatic: Calcified atherosclerotic disease involves the abdominal aorta. No aneurysm. No enlarged retroperitoneal or mesenteric adenopathy. No enlarged pelvic or inguinal lymph nodes.  Reproductive: Prostate gland and seminal vesicles are unremarkable.  Other: There is no ascites or focal fluid collections within the abdomen or pelvis. Periumbilical hernia contains fat only.  Musculoskeletal: Spondylosis noted within the lumbar spine. There is an interbody spacer device within the L4-5 disc space. Degenerative disc disease noted at L5-S1.  IMPRESSION: 1. Bilateral nephrolithiasis. 2. Left-sided hydronephrosis and hydroureter secondary to 7 mm distal left ureteral calculus.   Electronically Signed  By: Kerby Moors M.D.  On: 12/03/2015 10:16  CLINICAL DATA: Kidney stones  EXAM: ABDOMEN - 1 VIEW  COMPARISON: CT abdomen pelvis 12/03/2015  FINDINGS: Small bilateral renal calculi are partially obscured by overlying stool. 7 mm stone distal left ureter unchanged from the CT.  Constipation with stool throughout the colon. Negative for bowel obstruction.  IMPRESSION: Small bilateral renal calculi. 7 mm stone distal left ureter unchanged from the CT.   Electronically Signed  By: Franchot Gallo M.D.  On: 12/08/2015 11:56  Assessment & Plan:    1. Left ureteral stone:   Patient has a left 7 mm distal ureteral stone and a 4 mm left midpole  stone he would like addressed.  Patient has already been on medical expulsive therapy inadvertently with his tamsulosin for his BPH.  The stones have not passed.   I discussed ESWL versus URS/LL/stent placement with the patient.   He would like to undergo the URS/LL/stent placement as it has the higher probability of ridding him of his stone burden.    I explained  to the patient how the procedure is performed and the risks involved.   I informed patient that he may have a stent placed during the procedure and will remain in place after the procedure for a short time.  It will be removed in the office with a cystoscope, unless a string in left in place.  I explained to the patient that the stent  can  "rub" on the inside of the bladder, causing a feeling of needing to urinate/overactive bladder, but also the stent allows urine to pass up from the bladder to the kidney during urination - causing symptoms from a warm, tingling sensation to intense pain in the affected flank.  There may be residual stones within the ureter after the procedure that will need to be addressed at a later time.  Injury to the ureter is the most common intra-operative complication during ureteroscopy. The reported risk of perforation ranges greatly, depending on whether it is defined as a complete perforation (0.1-0.7% - think of this as a hole through the entire ureter), a partial perforation (1.6% - a hole nearly through the entire ureter), or mucosal tear/scrape (5% - these are similar to a sore on the inside of the mouth). Almost 100% of these will heal with prolonged stenting (anywhere between 2 - 4 weeks). Should a large perforation occur, your urologist may chose to stop the procedure and return on another day when the ureter has had time to heal.  I also explained the risks of general anesthesia, such as: MI, CVA, paralysis, coma and/or death.  Patient voiced his understanding and wishes to be scheduled for a cystoscopy with a left  ureteroscopy with laser lithotripsy with possible left ureteral stent placement for a left distal ureteral and left midpole stone.    - Urinalysis, Complete - CULTURE, URINE COMPREHENSIVE  2. Microscopic hematuria:    Patient was found to have microscopic hematuria on 11/25/2015. He has completed a CT urogram which demonstrated bilateral nephrolithiasis, left hydronephrosis and a 7 mm left distal ureteral stone.   He will have a cystoscopy during his stone removal procedure to complete the hematuria workup.   3. Left hydronephrosis:   Once patient has received definitive treatment for his 7 mm left UVJ stone he will undergo a renal ultrasound to confirm the hydronephrosis has resolved.    4. Bilateral renal stones:  Patient has one stone in each of his kidneys.  Left renal stone to be addressed at the time of URS.    Return for schedule Left URS/LL/left stent placement.  These notes generated with voice recognition software. I apologize for typographical errors.  Zara Council, [facility] [address] [place], [place] [address] [phone]

## 2015-12-18 NOTE — Discharge Instructions (Signed)
° °                                                    Ureteroscopy ° °A Ureteroscopy is an examination of the upper urinary tract, performed with a ureteroscope that is passed through the urethra, the bladder, and then directly into the ureter. It is performed to find the cause of urine blockage in a ureter, perform a biopsy or identify and evaluate other abnormalities inside the ureters or kidneys. It is useful in the diagnosis and treatment of disorders such as kidney stones, ureteral strictures or other abnormalities of the ureter. Smaller stones in the bladder or lower ureter can be removed in one piece, while bigger ones are usually broken before removal during ureteroscopy. ° °*After your ureteroscopy, your doctor may need to place a stent in a ureter to drain urine from the kidney to the bladder while swelling in the ureter goes away. The stent may cause some discomfort to your kidney and ureter. The discomfort is generally mild. The stent may be left in for a week or more. You will be instructed to either remove the stent yourself at home by pulling a string protruding from your urethra or to return to our office for removal by your doctor. ° °After a ureteroscopy you may  °have a mild burning feeling when urinating °see small amounts of blood in the urine °have mild discomfort in the bladder area or kidney area when urinating °need to urinate more frequently or urgently °*These problems should not last more than 24 hours unless a ureteral stent was placed. ° °If a stent was placed symptoms symptoms may continue until it is removed. You should notify your doctor right away if bleeding or pain is severe. ° °To prevent or relieve discomfort it can be helpful to °drink 16 ounces of water each hour for 2 hours after the procedure °take a warm bath to relieve the burning feeling °hold a warm, damp washcloth over the urethral opening to relieve discomfort °take an over-the-counter pain reliever ° °Please notify  our office if you experience any of the following symptoms °burning with urination lasting more than 24hrs °Fever greater than 100F or present directly to emergency department °abdominal or flank pain °very bloody, cloudy or foul smelling urine ° °*If you have any additional questions or concerns please call our office at [phone] ° °[facility] °[address], [address] °[place], [place] [address] °[phone] ° ° ° ° °AMBULATORY SURGERY  °DISCHARGE INSTRUCTIONS ° ° °1) The drugs that you were given will stay in your system until tomorrow so for the next 24 hours you should not: ° °A) Drive an automobile °B) Make any legal decisions °C) Drink any alcoholic beverage ° ° °2) You may resume regular meals tomorrow.  Today it is better to start with liquids and gradually work up to solid foods. ° °You may eat anything you prefer, but it is better to start with liquids, then soup and crackers, and gradually work up to solid foods. ° ° °3) Please notify your doctor immediately if you have any unusual bleeding, trouble breathing, redness and pain at the surgery site, drainage, fever, or pain not relieved by medication. ° ° ° °4) Additional Instructions: ° ° ° ° ° ° ° °Please contact your physician with any   problems or Same Day Surgery at [phone], Monday through Friday 6 am to 4 pm, or [place] [place] Main number at [phone]. °

## 2015-12-18 NOTE — Anesthesia Preprocedure Evaluation (Signed)
Anesthesia Evaluation  Patient identified by MRN, date of birth, ID band Patient awake    Reviewed: Allergy & Precautions, H&P , NPO status , Patient's Chart, lab work & pertinent test results  History of Anesthesia Complications (+) PROLONGED EMERGENCE and history of anesthetic complications  Airway Mallampati: III  TM Distance: >3 FB Neck ROM: limited    Dental  (+) Poor Dentition, Chipped, Caps   Pulmonary neg pulmonary ROS, neg shortness of breath,    Pulmonary exam normal breath sounds clear to auscultation       Cardiovascular Exercise Tolerance: Good hypertension, (-) angina(-) DOE Normal cardiovascular exam Rhythm:regular Rate:Normal     Neuro/Psych  Headaches, negative psych ROS   GI/Hepatic negative GI ROS, Neg liver ROS,   Endo/Other  negative endocrine ROS  Renal/GU Renal disease  negative genitourinary   Musculoskeletal negative musculoskeletal ROS (+) Arthritis ,   Abdominal   Peds negative pediatric ROS (+)  Hematology negative hematology ROS (+)   Anesthesia Other Findings Past Medical History:   History of MRSA infection                                    Herpes simplex                                               Hypertension                                                 Cancer (HCC)                                                   Comment:Basal cell carcinoma   Kidney stone                                                 Arthritis                                                    Enlarged prostate                                            Complication of anesthesia                                     Comment:slow to wake up  Past Surgical History:   SHOULDER SURGERY                                Right  11/10/2011     Comment:arthroscopic surgery . Dr. Tamala Julian, [facility]                                                    Comment:Upper neck surgery   APPENDECTOMY                                                    Reproductive/Obstetrics negative OB ROS                             Anesthesia Physical Anesthesia Plan  ASA: III  Anesthesia Plan: General LMA   Post-op Pain Management:    Induction:   Airway Management Planned:   Additional Equipment:   Intra-op Plan:   Post-operative Plan:   Informed Consent: I have reviewed the patients History and Physical, chart, labs and discussed the procedure including the risks, benefits and alternatives for the proposed anesthesia with the patient or authorized representative who has indicated his/her understanding and acceptance.   Dental Advisory Given  Plan Discussed with: Anesthesiologist, CRNA and Surgeon  Anesthesia Plan Comments:         Anesthesia Quick Evaluation

## 2015-12-18 NOTE — Anesthesia Postprocedure Evaluation (Signed)
Anesthesia Post Note  Patient: Brett Bender  Procedure(s) Performed: Procedure(s) (LRB): CYSTOSCOPY/URETEROSCOPY/HOLMIUM LASER LITHOTRIPSY (Left)  Patient location during evaluation: PACU Anesthesia Type: General Level of consciousness: awake and alert Pain management: pain level controlled Vital Signs Assessment: post-procedure vital signs reviewed and stable Respiratory status: spontaneous breathing, nonlabored ventilation, respiratory function stable and patient connected to nasal cannula oxygen Cardiovascular status: blood pressure returned to baseline and stable Postop Assessment: no signs of nausea or vomiting Anesthetic complications: no    Last Vitals:  Filed Vitals:   12/18/15 1028 12/18/15 1047  BP: 129/82 143/90  Pulse: 49 61  Temp: 36.2 C 35.8 C  Resp: 14 16    Last Pain:  Filed Vitals:   12/18/15 1050  PainSc: 8                  [name]

## 2015-12-19 LAB — CULTURE, URINE COMPREHENSIVE

## 2015-12-24 LAB — STONE ANALYSIS
Ca Oxalate,Dihydrate: 30 %
Ca Oxalate,Monohydr.: 60 %
Ca phos cry stone ql IR: 10 %
Stone Weight KSTONE: 190 mg

## 2015-12-25 ENCOUNTER — Other Ambulatory Visit: Payer: Medicare Other

## 2016-01-13 ENCOUNTER — Ambulatory Visit
Admission: RE | Admit: 2016-01-13 | Discharge: 2016-01-13 | Disposition: A | Discharge status: Home / Self Care | Payer: Medicare Other | Source: Ambulatory Visit | Attending: Urology | Admitting: Urology

## 2016-01-13 DIAGNOSIS — N200 Calculus of kidney: Secondary | ICD-10-CM | POA: Diagnosis not present

## 2016-01-14 DIAGNOSIS — D1800 Hemangioma unspecified site: Secondary | ICD-10-CM | POA: Diagnosis not present

## 2016-01-14 DIAGNOSIS — Z85828 Personal history of other malignant neoplasm of skin: Secondary | ICD-10-CM | POA: Diagnosis not present

## 2016-01-14 DIAGNOSIS — L578 Other skin changes due to chronic exposure to nonionizing radiation: Secondary | ICD-10-CM | POA: Diagnosis not present

## 2016-01-14 DIAGNOSIS — L570 Actinic keratosis: Secondary | ICD-10-CM | POA: Diagnosis not present

## 2016-01-14 DIAGNOSIS — C4441 Basal cell carcinoma of skin of scalp and neck: Secondary | ICD-10-CM | POA: Diagnosis not present

## 2016-01-14 DIAGNOSIS — D229 Melanocytic nevi, unspecified: Secondary | ICD-10-CM | POA: Diagnosis not present

## 2016-01-14 DIAGNOSIS — L820 Inflamed seborrheic keratosis: Secondary | ICD-10-CM | POA: Diagnosis not present

## 2016-01-14 DIAGNOSIS — D485 Neoplasm of uncertain behavior of skin: Secondary | ICD-10-CM | POA: Diagnosis not present

## 2016-01-14 DIAGNOSIS — L821 Other seborrheic keratosis: Secondary | ICD-10-CM | POA: Diagnosis not present

## 2016-01-19 ENCOUNTER — Encounter: Payer: Self-pay | Admitting: Urology

## 2016-01-19 ENCOUNTER — Ambulatory Visit (INDEPENDENT_AMBULATORY_CARE_PROVIDER_SITE_OTHER): Payer: Medicare Other | Admitting: Urology

## 2016-01-19 ENCOUNTER — Encounter: Payer: Self-pay | Admitting: Family Medicine

## 2016-01-19 ENCOUNTER — Ambulatory Visit (INDEPENDENT_AMBULATORY_CARE_PROVIDER_SITE_OTHER): Payer: Medicare Other | Admitting: Family Medicine

## 2016-01-19 ENCOUNTER — Telehealth: Payer: Self-pay | Admitting: Urology

## 2016-01-19 VITALS — BP 143/78 | HR 69 | Ht 75.0 in | Wt 226.0 lb

## 2016-01-19 VITALS — BP 138/80 | HR 68 | Temp 98.2°F | Resp 18 | Wt 227.1 lb

## 2016-01-19 DIAGNOSIS — N201 Calculus of ureter: Secondary | ICD-10-CM

## 2016-01-19 DIAGNOSIS — R05 Cough: Secondary | ICD-10-CM | POA: Diagnosis not present

## 2016-01-19 DIAGNOSIS — N200 Calculus of kidney: Secondary | ICD-10-CM | POA: Diagnosis not present

## 2016-01-19 DIAGNOSIS — J069 Acute upper respiratory infection, unspecified: Secondary | ICD-10-CM

## 2016-01-19 DIAGNOSIS — N1330 Unspecified hydronephrosis: Secondary | ICD-10-CM

## 2016-01-19 DIAGNOSIS — R3129 Other microscopic hematuria: Secondary | ICD-10-CM

## 2016-01-19 DIAGNOSIS — R059 Cough, unspecified: Secondary | ICD-10-CM

## 2016-01-19 LAB — URINALYSIS, COMPLETE
BILIRUBIN UA: NEGATIVE
Glucose, UA: NEGATIVE
LEUKOCYTES UA: NEGATIVE
Nitrite, UA: NEGATIVE
PH UA: 5.5 (ref 5.0–7.5)
Specific Gravity, UA: 1.025 (ref 1.005–1.030)
Urobilinogen, Ur: 0.2 mg/dL (ref 0.2–1.0)

## 2016-01-19 LAB — MICROSCOPIC EXAMINATION
BACTERIA UA: NONE SEEN
EPITHELIAL CELLS (NON RENAL): NONE SEEN /HPF (ref 0–10)

## 2016-01-19 MED ORDER — FLUTICASONE PROPIONATE 50 MCG/ACT NA SUSP: 2.0000 | Freq: Every day | NASAL | Status: DC

## 2016-01-19 MED ORDER — BENZONATATE 100 MG PO CAPS: 100.0000 mg | ORAL_CAPSULE | Freq: Two times a day (BID) | ORAL | Status: DC | PRN

## 2016-01-19 MED ORDER — HYDROCODONE-HOMATROPINE 5-1.5 MG/5ML PO SYRP: 5.0000 mL | ORAL_SOLUTION | Freq: Three times a day (TID) | ORAL | Status: DC | PRN

## 2016-01-19 NOTE — Progress Notes (Signed)
Patient: Brett Bender Male    DOB: [birth year]   70 y.o.   MRN: [id number] Visit Date: 01/19/2016  Today's Provider: Lelon Huh, MD   Chief Complaint  Patient presents with  . Cough  . Sore Throat    pt has had symptoms for last 3 days and they are not getting better  . Facial Pain   Subjective:    HPI  Pt has had cough and sore throat since Friday along with some sinus pressure, which are not getting any better. Pt states that he has chest(rib) pains from coughing so much. Cough is non-productive. Has been a little short of breath when he exerts himself. Tried some of his wife nose spray which helped with cough and congestion.      Allergies  Allergen Reactions  . Penicillins     Other reaction(s): Stomach Ache   Previous Medications   ATENOLOL-CHLORTHALIDONE (TENORETIC) 50-25 MG TABLET    Take 0.5 tablets by mouth daily.   BUTALBITAL-ACETAMINOPHEN-CAFFEINE (FIORICET, ESGIC) 50-325-40 MG PER TABLET    TAKE 1-2 TABLET BY MOUTH EVERY SIX HOURS AS NEEDED FOR HEADACHE   CYCLOBENZAPRINE (FLEXERIL) 5 MG TABLET    Take 1 tablet by mouth 2 (two) times daily as needed.   DICLOFENAC (CATAFLAM) 50 MG TABLET    TAKE 1 TABLET BY MOUTH TWICE DAILY   FINASTERIDE (PROSCAR) 5 MG TABLET    Take 1 tablet (5 mg total) by mouth daily.   HYDROCODONE-ACETAMINOPHEN (NORCO/VICODIN) 5-325 MG TABLET    Take 1 tablet by mouth every 6 (six) hours as needed for moderate pain.   NORTRIPTYLINE (PAMELOR) 25 MG CAPSULE    Take 1 capsule (25 mg total) by mouth at bedtime.   TAMSULOSIN (FLOMAX) 0.4 MG CAPS CAPSULE    Take 1 capsule (0.4 mg total) by mouth daily.   VALACYCLOVIR (VALTREX) 500 MG TABLET    TAKE 1 TABLET EVERY 12 HOURS AS NEEDED   ZOLPIDEM (AMBIEN) 5 MG TABLET    Take 1 tablet (5 mg total) by mouth at bedtime as needed.    Review of Systems  Social History  Substance Use Topics  . Smoking status: Never Smoker   . Smokeless tobacco: Not on file  . Alcohol Use: No   Objective:   BP 138/80 mmHg  Pulse 68  Temp(Src) 98.2 F (36.8 C)  Resp 18  Wt 227 lb 2 oz (103.023 kg)  Physical Exam  General Appearance:    Alert, cooperative, no distress  HENT:   bilateral TM normal without fluid or infection, neck without nodes, pharynx erythematous without exudate, sinuses nontender, post nasal drip noted and nasal mucosa pale and congested  Eyes:    PERRL, conjunctiva/corneas clear, EOM's intact       Lungs:     Clear to auscultation bilaterally, respirations unlabored  Heart:    Regular rate and rhythm  Neurologic:   Awake, alert, oriented x 3. No apparent focal neurological           defect.            Assessment & Plan:     1. Cough  - HYDROcodone-homatropine (HYCODAN) 5-1.5 MG/5ML syrup; Take 5 mLs by mouth every 8 (eight) hours as needed for cough.  Dispense: 120 mL; Refill: 0 - benzonatate (TESSALON) 100 MG capsule; Take 1 capsule (100 mg total) by mouth 2 (two) times daily as needed for cough.  Dispense: 20 capsule; Refill: 0  2. Upper respiratory  infection  - fluticasone (FLONASE) 50 MCG/ACT nasal spray; Place 2 sprays into both nostrils daily.  Dispense: 16 g; Refill: 6  Call if symptoms change or if not rapidly improving.          Lelon Huh, MD  [facility]

## 2016-01-19 NOTE — Telephone Encounter (Signed)
Would you call [facility] and arrange a 24 hour urine for this patient?

## 2016-01-19 NOTE — Progress Notes (Signed)
9:47 AM   Brett Bender 09/10/1946 [id number]  Referring provider: Birdie Sons, MD [address] [place], [place] [address]  Chief Complaint  Patient presents with  . Routine Post Op    4wk with u/s results    HPI: Patient is 70 year old Caucasian male who presents today for his RUS results after undergoing a cystoscopy, extraction of ureteral stone, left ureteroscopy, left retrograde pyelogram and left laser lithotripsy on 12/18/2015.    Patient has had an uneventful postoperative period   He has not had any further flank pain or gross hematuria.   He has not had any recent fevers, chills, nausea or vomiting.   Previous history Patient was seen for a routine visit on 11/24/2015 and the next day he contacted the office complaining of urinary frequency and burning.  He was instructed to present to the office for urinalysis and urine culture.  His UA had greater than 30 RBC's per high-power field and the urine culture was negative. He then underwent a CT urogram for further evaluation for the hematuria.  CT urogram demonstrated a left-sided hydronephrosis and hydroureter secondary to a 7 mm distal left ureteral calculi. He also was found to have bilateral nephrolithiasis.  He then underwent cystoscopy, L URS/LL/left retrograde on 12/18/2015.    Today, he has no urological complaints. He is suffering with an upper respiratory infection for which she is going to see his primary care physician for later today.  He denies any flank pain or gross hematuria.  Not had any fevers, chills, nausea or vomiting.  His stone analysis noted a 60% calcium oxalate monohydrate, 30% calcium oxalate dihydrate and a 10% calcium phosphate crystals stone.    His RUS from 01/13/2016 noted no that the hydronephrosis has resolved and non obstructing bilateral renal stones.  I have reviewed the films with the patient.    His UA demonstrated 3-10 RBC's/hpf on today's exam.     PMH: Past  Medical History  Diagnosis Date  . History of MRSA infection   . Herpes simplex   . Hypertension   . Cancer (HCC)     Basal cell carcinoma  . Kidney stone   . Arthritis   . Enlarged prostate   . Complication of anesthesia     slow to wake up    Surgical History: Past Surgical History  Procedure Laterality Date  . Shoulder surgery Right 11/10/2011    arthroscopic surgery . Dr. Tamala Julian, [facility]  . Tonsillectomy    . Lower back surgery    . Neck surgery      Upper neck surgery  . Appendectomy    . Cystoscopy/ureteroscopy/holmium laser/stent placement Left 12/18/2015    Procedure: CYSTOSCOPY/URETEROSCOPY/HOLMIUM LASER LITHOTRIPSY;  Surgeon: Hollice Espy, MD;  Location: [facility] ORS;  Service: Urology;  Laterality: Left;    Home Medications:    Medication List       This list is accurate as of: 01/19/16  9:47 AM.  Always use your most recent med list.               atenolol-chlorthalidone 50-25 MG tablet  Commonly known as:  TENORETIC  Take 0.5 tablets by mouth daily.     butalbital-acetaminophen-caffeine 50-325-40 MG tablet  Commonly known as:  FIORICET, ESGIC  TAKE 1-2 TABLET BY MOUTH EVERY SIX HOURS AS NEEDED FOR HEADACHE     cyclobenzaprine 5 MG tablet  Commonly known as:  FLEXERIL  Take 1 tablet by mouth 2 (two) times  daily as needed.     diclofenac 50 MG tablet  Commonly known as:  CATAFLAM  TAKE 1 TABLET BY MOUTH TWICE DAILY     finasteride 5 MG tablet  Commonly known as:  PROSCAR  Take 1 tablet (5 mg total) by mouth daily.     HYDROcodone-acetaminophen 5-325 MG tablet  Commonly known as:  NORCO/VICODIN  Take 1 tablet by mouth every 6 (six) hours as needed for moderate pain.     nortriptyline 25 MG capsule  Commonly known as:  PAMELOR  Take 1 capsule (25 mg total) by mouth at bedtime.     tamsulosin 0.4 MG Caps capsule  Commonly known as:  FLOMAX  Take 1 capsule (0.4 mg total) by mouth daily.     valACYclovir 500 MG tablet  Commonly known as:  VALTREX    TAKE 1 TABLET EVERY 12 HOURS AS NEEDED     zolpidem 5 MG tablet  Commonly known as:  AMBIEN  Take 1 tablet (5 mg total) by mouth at bedtime as needed.        Allergies:  Allergies  Allergen Reactions  . Penicillins     Other reaction(s): Stomach Ache    Family History: Family History  Problem Relation Age of Onset  . Diabetes Neg Hx   . Cancer Neg Hx   . Heart attack Neg Hx   . Kidney disease Neg Hx   . Prostate cancer Neg Hx     Social History:  reports that he has never smoked. He does not have any smokeless tobacco history on file. He reports that he does not drink alcohol or use illicit drugs.  ROS: UROLOGY Frequent Urination?: No Hard to postpone urination?: No Burning/pain with urination?: No Get up at night to urinate?: No Leakage of urine?: No Urine stream starts and stops?: No Trouble starting stream?: No Do you have to strain to urinate?: No Blood in urine?: No Urinary tract infection?: No Sexually transmitted disease?: No Injury to kidneys or bladder?: No Painful intercourse?: No Weak stream?: No Erection problems?: No Penile pain?: No  Gastrointestinal Nausea?: No Vomiting?: No Indigestion/heartburn?: No Diarrhea?: No Constipation?: No  Constitutional Fever: No Night sweats?: No Weight loss?: No Fatigue?: No  Skin Skin rash/lesions?: No Itching?: No  Eyes Blurred vision?: No Double vision?: No  Ears/Nose/Throat Sore throat?: Yes Sinus problems?: Yes  Hematologic/Lymphatic Swollen glands?: No Easy bruising?: No  Cardiovascular Leg swelling?: No Chest pain?: No  Respiratory Cough?: Yes Shortness of breath?: No  Endocrine Excessive thirst?: No  Musculoskeletal Back pain?: No Joint pain?: No  Neurological Headaches?: No Dizziness?: No  Psychologic Depression?: No Anxiety?: No  Physical Exam: BP 143/78 mmHg  Pulse 69  Ht _0  (1.905 m)  Wt 226 lb (102.513 kg)  BMI 28.25 kg/m2  Constitutional: Well  nourished. Alert and oriented, No acute distress. HEENT: [place] AT, moist mucus membranes. Trachea midline, no masses. Cardiovascular: No clubbing, cyanosis, or edema. Respiratory: Normal respiratory effort, no increased work of breathing. GI: Abdomen is soft, non tender, non distended, no abdominal masses.  GU: No CVA tenderness.  No bladder fullness or masses.   Skin: No rashes, bruises or suspicious lesions. Lymph: No cervical or inguinal adenopathy. Neurologic: Grossly intact, no focal deficits, moving all 4 extremities. Psychiatric: Normal mood and affect.  Laboratory Data: Lab Results  Component Value Date   WBC 8.1 12/17/2015   HGB 15.0 12/17/2015   HCT 45.1 12/17/2015   MCV 94.6 12/17/2015   PLT 213  12/17/2015    Lab Results  Component Value Date   CREATININE 1.03 12/17/2015    Lab Results  Component Value Date   PSA 0.1 11/28/2014  PSA History  <0.1 ng/mL on 11/24/2015   Lab Results  Component Value Date   TSH 1.02 02/27/2012    Lab Results  Component Value Date   AST 24 02/27/2012   Lab Results  Component Value Date   ALT 31 02/27/2012    Urinalysis Results for orders placed or performed in visit on 01/19/16  Microscopic Examination  Result Value Ref Range   WBC, UA 6-10 (A) 0 -  5 /hpf   RBC, UA 3-10 (A) 0 -  2 /hpf   Epithelial Cells (non renal) None seen 0 - 10 /hpf   Bacteria, UA None seen None seen/Few  Urinalysis, Complete  Result Value Ref Range   Specific Gravity, UA 1.025 1.005 - 1.030   pH, UA 5.5 5.0 - 7.5   Color, UA Yellow Yellow   Appearance Ur Clear Clear   Leukocytes, UA Negative Negative   Protein, UA 2+ (A) Negative/Trace   Glucose, UA Negative Negative   Ketones, UA Trace (A) Negative   RBC, UA Trace (A) Negative   Bilirubin, UA Negative Negative   Urobilinogen, Ur 0.2 0.2 - 1.0 mg/dL   Nitrite, UA Negative Negative   Microscopic Examination See below:     Pertinent Imaging: CLINICAL DATA: Renal  stone.  EXAM: RENAL / URINARY TRACT ULTRASOUND COMPLETE  COMPARISON: None.  FINDINGS: Right Kidney:  Length: 11.0 cm. Echogenicity within normal limits. No solid mass or hydronephrosis visualized. 1.1 cm calyceal midportion right kidney.  Left Kidney:  Length: 11.3 cm. Echogenicity within normal limits. No solid mass or hydronephrosis visualized. 3 mm calyceal stone lower portion left kidney.  Bladder:  Appears normal for degree of bladder distention.  IMPRESSION: 1. Nonobstructing bilateral renal calyceal stones.  2. No acute abnormality .   Electronically Signed  By: Marcello Moores Register  On: 01/13/2016 10:47  Assessment & Plan:    1. Left ureteral stone:   Has been removed and the RUS demonstrates that the hydronephrosis has resolved.  He would like to undergo 24 hour urine testing at this time.  [facility] will be contacted and will send him the kit.  He will RTC when the testing is completed to discuss the results.    2. Microscopic hematuria:    Patient has completed a hematuria workup early this year (12/2015) with a CT Urogram and cystoscopy associated with an URS.  No GU malignancies were discovered.  We will continue to monitor with yearly UA's.  He will report any gross hematuria.    - Urinalysis, Complete  3. Left hydronephrosis:   Patient's RUS from 01/13/2016 demonstrates that his hydronephrosis has resolved.    4. Bilateral renal stones:  Patient has one stone in each of his kidneys.  Patient would like to manage his stones conservatively at this point.  He will RTC in one year with a KUB and office visit.  If he should develop any renal colic, he will contact our office.    Return for UA, IPSS score and exam.  These notes generated with voice recognition software. I apologize for typographical errors.  Zara Council, [facility] [address] [place], [place] [address] [phone]

## 2016-01-20 NOTE — Telephone Encounter (Signed)
[facility] has been ordered.

## 2016-01-23 ENCOUNTER — Telehealth: Payer: Self-pay | Admitting: Family Medicine

## 2016-01-23 MED ORDER — AZITHROMYCIN 250 MG PO TABS: ORAL_TABLET | ORAL | Status: AC

## 2016-01-23 NOTE — Telephone Encounter (Signed)
Have sent rx zpack to pharmacy

## 2016-01-23 NOTE — Telephone Encounter (Signed)
Patient was seen Monday for cough and URI. Please advise?

## 2016-01-23 NOTE — Telephone Encounter (Signed)
Left vm notifying pt that rx has been sent.

## 2016-01-23 NOTE — Telephone Encounter (Signed)
Pt states he was seen Monday and he is  feeling some better.  Pt states he has chest congestion and is coughing up clear/white mucus.  Pt is requesting a Rx to help with this.  [employer].  [phone]

## 2016-01-28 ENCOUNTER — Other Ambulatory Visit: Payer: Self-pay | Admitting: Family Medicine

## 2016-01-28 ENCOUNTER — Other Ambulatory Visit: Payer: Medicare Other

## 2016-01-28 DIAGNOSIS — N200 Calculus of kidney: Secondary | ICD-10-CM | POA: Diagnosis not present

## 2016-02-03 ENCOUNTER — Encounter: Payer: Self-pay | Admitting: Family Medicine

## 2016-02-03 ENCOUNTER — Ambulatory Visit (INDEPENDENT_AMBULATORY_CARE_PROVIDER_SITE_OTHER): Payer: Medicare Other | Admitting: Family Medicine

## 2016-02-03 VITALS — BP 130/80 | HR 69 | Temp 97.5°F | Resp 16 | Wt 217.0 lb

## 2016-02-03 DIAGNOSIS — G4700 Insomnia, unspecified: Secondary | ICD-10-CM | POA: Diagnosis not present

## 2016-02-03 DIAGNOSIS — C4441 Basal cell carcinoma of skin of scalp and neck: Secondary | ICD-10-CM | POA: Diagnosis not present

## 2016-02-03 DIAGNOSIS — R682 Dry mouth, unspecified: Secondary | ICD-10-CM | POA: Diagnosis not present

## 2016-02-03 DIAGNOSIS — N400 Enlarged prostate without lower urinary tract symptoms: Secondary | ICD-10-CM | POA: Diagnosis not present

## 2016-02-03 NOTE — Progress Notes (Signed)
Patient: Brett Bender Male    DOB: December 01, 1945   70 y.o.   MRN: [id number] Visit Date: 02/03/2016  Today's Provider: Lelon Huh, MD   Chief Complaint  Patient presents with  . Medication Problem   Subjective:    HPI  Having trouble staying asleep for the last two weeks. Has has sinus and chest congestion. He was seen in office for cough and URI 01/19/2016. He was given hydrocodone syrup, benzonatate and z-pack. He finished antiobiotic about a week ago and has not taken either of the cough medications since then. Since starting the medications he has been awaking-up at least 2 times each night to urinate and having trouble getting back to sleep.   Today patient started having exteremly dry month and a some congestion, but is able to breath through nose when lying down. He took 1 1/2 [place] a couple nights ago and slept through the night.     Allergies  Allergen Reactions  . Penicillins     Other reaction(s): Stomach Ache   Previous Medications   ATENOLOL-CHLORTHALIDONE (TENORETIC) 50-25 MG TABLET    TAKE 1/2 TABLET BY MOUTH EVERY DAY   BUTALBITAL-ACETAMINOPHEN-CAFFEINE (FIORICET, ESGIC) 50-325-40 MG PER TABLET    TAKE 1-2 TABLET BY MOUTH EVERY SIX HOURS AS NEEDED FOR HEADACHE   CYCLOBENZAPRINE (FLEXERIL) 5 MG TABLET    Take 1 tablet by mouth 2 (two) times daily as needed.   DICLOFENAC (CATAFLAM) 50 MG TABLET    TAKE 1 TABLET BY MOUTH TWICE DAILY   FINASTERIDE (PROSCAR) 5 MG TABLET    Take 1 tablet (5 mg total) by mouth daily.   FLUTICASONE (FLONASE) 50 MCG/ACT NASAL SPRAY    Place 2 sprays into both nostrils daily.   NORTRIPTYLINE (PAMELOR) 25 MG CAPSULE    Take 1 capsule (25 mg total) by mouth at bedtime.   TAMSULOSIN (FLOMAX) 0.4 MG CAPS CAPSULE    Take 1 capsule (0.4 mg total) by mouth daily.   VALACYCLOVIR (VALTREX) 500 MG TABLET    TAKE 1 TABLET EVERY 12 HOURS AS NEEDED   ZOLPIDEM (AMBIEN) 5 MG TABLET    Take 1 tablet (5 mg total) by mouth at bedtime as needed.     Review of Systems  Constitutional: Negative for fever, chills and appetite change.  Respiratory: Negative for chest tightness, shortness of breath and wheezing.   Cardiovascular: Negative for chest pain and palpitations.  Gastrointestinal: Negative for nausea, vomiting and abdominal pain.  Psychiatric/Behavioral: Positive for sleep disturbance.    Social History  Substance Use Topics  . Smoking status: Never Smoker   . Smokeless tobacco: Not on file  . Alcohol Use: No   Objective:   BP 130/80 mmHg  Pulse 69  Temp(Src) 97.5 F (36.4 C) (Oral)  Resp 16  Wt 217 lb (98.431 kg)  SpO2 96%  Physical Exam  General Appearance:    Alert, cooperative, no distress  HENT:   nasal mucosa congested  Eyes:    PERRL, conjunctiva/corneas clear, EOM's intact       Lungs:     Clear to auscultation bilaterally, respirations unlabored  Heart:    Regular rate and rhythm  Neurologic:   Awake, alert, oriented x 3. No apparent focal neurological           defect.           Assessment & Plan:     1. Dry mouth Probably combination of TCA and mouth breathing at night.  He is cut skip nortriptylline for a few nights. Consider changing to 10mg  tablets  2. Insomnia May temporarily go up to 7.5mg  Ambien. Call if this is not effective.   3. BPH (benign prostatic hyperplasia) May be aggravated by TCA as well.         Lelon Huh, MD  [facility]

## 2016-02-03 NOTE — Patient Instructions (Signed)
   Stop nortriptyline for a few days. If our neck starts to bother you we can change it to a 10mg  tablet.    You make take 1 1/2 tablet of Ambien for a few nights to improve sleep. If you continue to require this dose after 2 weeks we will write a new prescription for a higher quantity.

## 2016-02-06 ENCOUNTER — Encounter: Payer: Self-pay | Admitting: Family Medicine

## 2016-02-06 ENCOUNTER — Ambulatory Visit (INDEPENDENT_AMBULATORY_CARE_PROVIDER_SITE_OTHER): Payer: Medicare Other | Admitting: Family Medicine

## 2016-02-06 VITALS — BP 134/78 | HR 68 | Temp 98.0°F | Resp 16 | Wt 215.0 lb

## 2016-02-06 DIAGNOSIS — G4700 Insomnia, unspecified: Secondary | ICD-10-CM | POA: Diagnosis not present

## 2016-02-06 DIAGNOSIS — R351 Nocturia: Secondary | ICD-10-CM

## 2016-02-06 LAB — POCT URINALYSIS DIPSTICK
Bilirubin, UA: NEGATIVE
Blood, UA: NEGATIVE
Glucose, UA: NEGATIVE
Ketones, UA: NEGATIVE
Leukocytes, UA: NEGATIVE
Nitrite, UA: NEGATIVE
PH UA: 6.5
PROTEIN UA: NEGATIVE
SPEC GRAV UA: 1.01
UROBILINOGEN UA: 0.2

## 2016-02-06 MED ORDER — ALPRAZOLAM 0.5 MG PO TABS: ORAL_TABLET | ORAL | Status: DC

## 2016-02-06 NOTE — Progress Notes (Signed)
Patient: Brett Bender Male    DOB: 09/21/1946   70 y.o.   MRN: [id number] Visit Date: 02/06/2016  Today's Provider: Lelon Huh, MD   Chief Complaint  Patient presents with  . Insomnia   Subjective:    HPI Insomnia: Patient was last seen 3 days ago. Changes made during that visit includes advising patient to temporarily increase Ambien to 7.5mg . Today patient states he is still having trouble staying asleep, and is no better since increasing Ambien. He reports he is able to fall asleep quickly, but wakes up after 3-4 hours.  Patient has increased Ambien from 5mg  to 7.5mg  daily and has stopped taking Nortriptyline as directed at the last office visit. He was having very dry mouth before stopping nortriptyline, which has resolved. He has also been having increased urinary frequency especially at night, but this has not changed since stopping nortriptyline. He states once he wakes up he gets anxious about falling asleep and thinks it is all in his head. .       Allergies  Allergen Reactions  . Penicillins     Other reaction(s): Stomach Ache   Previous Medications   ATENOLOL-CHLORTHALIDONE (TENORETIC) 50-25 MG TABLET    TAKE 1/2 TABLET BY MOUTH EVERY DAY   BUTALBITAL-ACETAMINOPHEN-CAFFEINE (FIORICET, ESGIC) 50-325-40 MG PER TABLET    TAKE 1-2 TABLET BY MOUTH EVERY SIX HOURS AS NEEDED FOR HEADACHE   CYCLOBENZAPRINE (FLEXERIL) 5 MG TABLET    Take 1 tablet by mouth 2 (two) times daily as needed. Reported on 02/06/2016   DICLOFENAC (CATAFLAM) 50 MG TABLET    TAKE 1 TABLET BY MOUTH TWICE DAILY   FINASTERIDE (PROSCAR) 5 MG TABLET    Take 1 tablet (5 mg total) by mouth daily.   FLUTICASONE (FLONASE) 50 MCG/ACT NASAL SPRAY    Place 2 sprays into both nostrils daily.   TAMSULOSIN (FLOMAX) 0.4 MG CAPS CAPSULE    Take 1 capsule (0.4 mg total) by mouth daily.   VALACYCLOVIR (VALTREX) 500 MG TABLET    TAKE 1 TABLET EVERY 12 HOURS AS NEEDED   ZOLPIDEM (AMBIEN) 5 MG TABLET    Take 1 tablet  (5 mg total) by mouth at bedtime as needed.    Review of Systems  Constitutional: Positive for fatigue. Negative for fever, chills and appetite change.  Respiratory: Negative for chest tightness, shortness of breath and wheezing.   Cardiovascular: Negative for chest pain and palpitations.  Gastrointestinal: Negative for nausea, vomiting and abdominal pain.  Psychiatric/Behavioral: Positive for sleep disturbance.    Social History  Substance Use Topics  . Smoking status: Never Smoker   . Smokeless tobacco: Not on file  . Alcohol Use: No   Objective:   BP 134/78 mmHg  Pulse 68  Temp(Src) 98 F (36.7 C) (Oral)  Resp 16  Wt 215 lb (97.523 kg)  SpO2 96%  Physical Exam  General appearance: alert, well developed, well nourished, cooperative and in no distress Head: Normocephalic, without obvious abnormality, atraumatic Lungs: Respirations even and unlabored Extremities: No gross deformities Skin: Skin color, texture, turgor normal. No rashes seen  Psych: Appropriate mood and affect. Neurologic: Mental status: Alert, oriented to person, place, and time, thought content appropriate.  Results for orders placed or performed in visit on 02/06/16  POCT urinalysis dipstick  Result Value Ref Range   Color, UA yellow    Clarity, UA clear    Glucose, UA negative    Bilirubin, UA negative    Ketones,  UA negative    Spec Grav, UA 1.010    Blood, UA negative    pH, UA 6.5    Protein, UA negative    Urobilinogen, UA 0.2    Nitrite, UA negative    Leukocytes, UA Negative Negative       Assessment & Plan:     1. Nocturia Likely worsening BPH and is scheduled to see urologist next week - POCT urinalysis dipstick   2. Insomnia Aggravated by the anxiety of worrying about getting back to sleep after he wakes in the middle of the night. He is to Ambien back to 5mg  and may take xanax to help relax to get back to sleep after waking.  - ALPRAZolam (XANAX) 0.5 MG tablet; 1 tablet at  night at needed for insomnia  Dispense: 30 tablet; Refill: 0  He has taken sertraline in the past for anxiety and depression and will consider starting back on this if insomnia continues to be a problem.      Lelon Huh, MD  [facility]

## 2016-02-11 ENCOUNTER — Ambulatory Visit
Admission: RE | Admit: 2016-02-11 | Discharge: 2016-02-11 | Disposition: A | Discharge status: Home / Self Care | Payer: Medicare Other | Source: Ambulatory Visit | Attending: Urology | Admitting: Urology

## 2016-02-11 ENCOUNTER — Ambulatory Visit (INDEPENDENT_AMBULATORY_CARE_PROVIDER_SITE_OTHER): Payer: Medicare Other | Admitting: Urology

## 2016-02-11 ENCOUNTER — Other Ambulatory Visit: Payer: Self-pay | Admitting: Urology

## 2016-02-11 ENCOUNTER — Encounter: Payer: Self-pay | Admitting: Urology

## 2016-02-11 VITALS — BP 150/82 | HR 66 | Ht 75.5 in | Wt 211.6 lb

## 2016-02-11 DIAGNOSIS — Z87442 Personal history of urinary calculi: Secondary | ICD-10-CM | POA: Diagnosis not present

## 2016-02-11 DIAGNOSIS — N200 Calculus of kidney: Secondary | ICD-10-CM | POA: Diagnosis not present

## 2016-02-11 DIAGNOSIS — R351 Nocturia: Secondary | ICD-10-CM | POA: Diagnosis not present

## 2016-02-11 DIAGNOSIS — Z09 Encounter for follow-up examination after completed treatment for conditions other than malignant neoplasm: Secondary | ICD-10-CM | POA: Insufficient documentation

## 2016-02-11 DIAGNOSIS — F419 Anxiety disorder, unspecified: Secondary | ICD-10-CM | POA: Insufficient documentation

## 2016-02-11 DIAGNOSIS — R350 Frequency of micturition: Secondary | ICD-10-CM

## 2016-02-11 LAB — URINALYSIS, COMPLETE
Bilirubin, UA: NEGATIVE
GLUCOSE, UA: NEGATIVE
KETONES UA: NEGATIVE
Leukocytes, UA: NEGATIVE
NITRITE UA: NEGATIVE
Protein, UA: NEGATIVE
RBC UA: NEGATIVE
SPEC GRAV UA: 1.02 (ref 1.005–1.030)
UUROB: 0.2 mg/dL (ref 0.2–1.0)
pH, UA: 7 (ref 5.0–7.5)

## 2016-02-11 LAB — MICROSCOPIC EXAMINATION
Bacteria, UA: NONE SEEN
Epithelial Cells (non renal): NONE SEEN /hpf (ref 0–10)

## 2016-02-11 LAB — BLADDER SCAN AMB NON-IMAGING: SCAN RESULT: 0

## 2016-02-11 NOTE — Progress Notes (Addendum)
02/11/2016 10:37 AM   Brett Bender [birth year] [id number]  Referring provider: Birdie Sons, MD [address] [place], [place] [address]  Chief Complaint  Patient presents with  . Urinary Frequency    x one month     HPI: Patient is a 69 year old Caucasian male who presents today with a 1 month history of an increase in urinary frequency and nocturia.  Patient states that 1 month ago he was given a nasal spray, cough syrup and a Z-pak for an upper respiratory infection.   He was then he noticed increasing urinary frequency and nocturia.  He also had an increase in his anxiety level as well.  He currently takes Ambien for insomnia and has had the addition of alprazolam last week.  He does currently have a scheduled appointment with psychiatry in May.  He is currently taking tamsulosin and finasteride.  His IPSS score is 25/4.  His PVR is 0 mL.  His UA today is unremarkable.  He does admit to drinking copious amount of water due to the dry mouth side effects of some of his medications.  He states he's noticed increase from nocturia getting up one time a night to sometimes 3-4.  Not undergone a sleep study, but he states that a few years ago on the He tripped some of his friends they stated he had stopped breathing during the night.  He is a snorer.  He is not experiencing dysuria, gross hematuria or suprapubic pain.  He is not experiencing urgency or flank pain.  He does have bilateral nephrolithiasis.        IPSS      02/11/16 1000       International Prostate Symptom Score   How often have you had the sensation of not emptying your bladder? More than half the time     How often have you had to urinate less than every two hours? Almost always     How often have you found you stopped and started again several times when you urinated? More than half the time     How often have you found it difficult to postpone urination? More than half the time     How often have  you had a weak urinary stream? About half the time     How often have you had to strain to start urination? Less than half the time     How many times did you typically get up at night to urinate? 3 Times     Total IPSS Score 25     Quality of Life due to urinary symptoms   If you were to spend the rest of your life with your urinary condition just the way it is now how would you feel about that? Mostly Disatisfied        Score:  1-7 Mild 8-19 Moderate 20-35 Severe   PMH: Past Medical History  Diagnosis Date  . History of MRSA infection   . Herpes simplex   . Hypertension   . Cancer (HCC)     Basal cell carcinoma  . Kidney stone   . Arthritis   . Enlarged prostate   . Complication of anesthesia     slow to wake up    Surgical History: Past Surgical History  Procedure Laterality Date  . Shoulder surgery Right 11/10/2011    arthroscopic surgery . Dr. Tamala Julian, [facility]  . Tonsillectomy    . Lower back surgery    .  Neck surgery      Upper neck surgery  . Appendectomy    . Cystoscopy/ureteroscopy/holmium laser/stent placement Left 12/18/2015    Procedure: CYSTOSCOPY/URETEROSCOPY/HOLMIUM LASER LITHOTRIPSY;  Surgeon: Hollice Espy, MD;  Location: [facility] ORS;  Service: Urology;  Laterality: Left;    Home Medications:    Medication List       This list is accurate as of: 02/11/16 10:37 AM.  Always use your most recent med list.               ALPRAZolam 0.5 MG tablet  Commonly known as:  XANAX  1 tablet at night at needed for insomnia     atenolol-chlorthalidone 50-25 MG tablet  Commonly known as:  TENORETIC  TAKE 1/2 TABLET BY MOUTH EVERY DAY     butalbital-acetaminophen-caffeine 50-325-40 MG tablet  Commonly known as:  FIORICET, ESGIC  TAKE 1-2 TABLET BY MOUTH EVERY SIX HOURS AS NEEDED FOR HEADACHE     cyclobenzaprine 5 MG tablet  Commonly known as:  FLEXERIL  Take 1 tablet by mouth 2 (two) times daily as needed. Reported on 02/11/2016     diclofenac 50 MG tablet    Commonly known as:  CATAFLAM  TAKE 1 TABLET BY MOUTH TWICE DAILY     finasteride 5 MG tablet  Commonly known as:  PROSCAR  Take 1 tablet (5 mg total) by mouth daily.     fluticasone 50 MCG/ACT nasal spray  Commonly known as:  FLONASE  Place 2 sprays into both nostrils daily.     HYDROcodone-acetaminophen 5-325 MG tablet  Commonly known as:  NORCO/VICODIN  Reported on 02/11/2016     nortriptyline 25 MG capsule  Commonly known as:  PAMELOR  Reported on 02/11/2016     tamsulosin 0.4 MG Caps capsule  Commonly known as:  FLOMAX  Take 1 capsule (0.4 mg total) by mouth daily.     valACYclovir 500 MG tablet  Commonly known as:  VALTREX  TAKE 1 TABLET EVERY 12 HOURS AS NEEDED     zolpidem 5 MG tablet  Commonly known as:  AMBIEN  Take 1 tablet (5 mg total) by mouth at bedtime as needed.        Allergies:  Allergies  Allergen Reactions  . Penicillins     Other reaction(s): Stomach Ache    Family History: Family History  Problem Relation Age of Onset  . Diabetes Neg Hx   . Cancer Neg Hx   . Heart attack Neg Hx   . Kidney disease Neg Hx   . Prostate cancer Neg Hx     Social History:  reports that he has never smoked. He does not have any smokeless tobacco history on file. He reports that he does not drink alcohol or use illicit drugs.  ROS: UROLOGY Frequent Urination?: Yes Hard to postpone urination?: No Burning/pain with urination?: No Get up at night to urinate?: Yes Leakage of urine?: No Urine stream starts and stops?: No Trouble starting stream?: No Do you have to strain to urinate?: No Blood in urine?: No Urinary tract infection?: No Sexually transmitted disease?: No Injury to kidneys or bladder?: No Painful intercourse?: No Weak stream?: No Erection problems?: No Penile pain?: No  Gastrointestinal Nausea?: No Vomiting?: No Indigestion/heartburn?: No Diarrhea?: No Constipation?: No  Constitutional Fever: No Night sweats?: No Weight loss?:  No Fatigue?: No  Skin Skin rash/lesions?: No Itching?: No  Eyes Blurred vision?: No Double vision?: No  Ears/Nose/Throat Sore throat?: No Sinus problems?: No  Hematologic/Lymphatic Swollen  glands?: No Easy bruising?: No  Cardiovascular Leg swelling?: No Chest pain?: No  Respiratory Cough?: No Shortness of breath?: No  Endocrine Excessive thirst?: No  Musculoskeletal Back pain?: No Joint pain?: No  Neurological Headaches?: No Dizziness?: No  Psychologic Depression?: No Anxiety?: No  Physical Exam: BP 150/82 mmHg  Pulse 66  Ht 6' 3.5" (1.918 m)  Wt 211 lb 9.6 oz (95.981 kg)  BMI 26.09 kg/m2  Constitutional: Well nourished. Alert and oriented, No acute distress. HEENT: [place] AT, moist mucus membranes. Trachea midline, no masses. Cardiovascular: No clubbing, cyanosis, or edema. Respiratory: Normal respiratory effort, no increased work of breathing. Skin: No rashes, bruises or suspicious lesions. Lymph: No cervical or inguinal adenopathy. Neurologic: Grossly intact, no focal deficits, moving all 4 extremities. Psychiatric: Normal mood and affect.  Laboratory Data: Lab Results  Component Value Date   WBC 8.1 12/17/2015   HGB 15.0 12/17/2015   HCT 45.1 12/17/2015   MCV 94.6 12/17/2015   PLT 213 12/17/2015    Lab Results  Component Value Date   CREATININE 1.03 12/17/2015    Lab Results  Component Value Date   PSA 0.1 11/28/2014   PSA History  <0.1 ng/mL on 11/24/2015   Lab Results  Component Value Date   TSH 1.02 02/27/2012       Component Value Date/Time   CHOL 202* 11/20/2015 0802   CHOL 195 11/28/2014   HDL 50 11/20/2015 0802   HDL 47 11/28/2014   CHOLHDL 4.0 11/20/2015 0802   LDLCALC 127* 11/20/2015 0802   LDLCALC 115 11/28/2014    Lab Results  Component Value Date   AST 24 02/27/2012   Lab Results  Component Value Date   ALT 31 02/27/2012     Urinalysis Results for orders placed or performed in visit on 02/11/16   Microscopic Examination  Result Value Ref Range   WBC, UA 0-5 0 -  5 /hpf   RBC, UA 0-2 0 -  2 /hpf   Epithelial Cells (non renal) None seen 0 - 10 /hpf   Bacteria, UA None seen None seen/Few  Urinalysis, Complete  Result Value Ref Range   Specific Gravity, UA 1.020 1.005 - 1.030   pH, UA 7.0 5.0 - 7.5   Color, UA Yellow Yellow   Appearance Ur Clear Clear   Leukocytes, UA Negative Negative   Protein, UA Negative Negative/Trace   Glucose, UA Negative Negative   Ketones, UA Negative Negative   RBC, UA Negative Negative   Bilirubin, UA Negative Negative   Urobilinogen, Ur 0.2 0.2 - 1.0 mg/dL   Nitrite, UA Negative Negative   Microscopic Examination See below:   BLADDER SCAN AMB NON-IMAGING  Result Value Ref Range   Scan Result 0     Pertinent Imaging: Results for ELZA, ORN (MRN [id number]) as of 02/11/2016 10:46  Ref. Range 02/11/2016 10:10  Scan Result Unknown 0    Assessment & Plan:    1. Urinary frequency:   Patient is quite anxious during today's exam.  He would like a referral to another psychiatrist to see if he can be seen sooner that May.  I will make a referral to Dr. Nicolasa Ducking to see if she can see him sooner.  High anxiety levels can cause urinary frequency.  Patient given samples of Myrbetriq 25 mg daily.  I have advised the patient of the side effects of Myrbetriq, such as: elevation in BP, urinary retention and/or HA.  He will RTC in 2 weeks for PVR  and IPSS.    - Urinalysis, Complete - CULTURE, URINE COMPREHENSIVE - BLADDER SCAN AMB NON-IMAGING  2. Nocturia:   I explained to the patient that nocturia is often multi-factorial and difficult to treat.  Sleeping disorders, heart conditions and peripheral vascular disease, diabetes,  enlarged prostate or urethral stricture causing bladder outlet obstruction and/or certain medications.  The patient may also benefit from a discussion with his primary care physician to see if he has risk factors for sleep apnea or other  sleep disturbances and obtaining a sleep study.  I will also obtain a BMP to check sodium levels as he is drinking copious amounts of water.    3. Nephrolithiasis:    I'll have the patient obtain a KUB today to see if one of his stones may have migrated into the ureter that may have caused the acute of these symptoms.    4. Anxiety:   Made a referral to Dr. Nicolasa Ducking.    Return in about 2 weeks (around 02/25/2016) for PVR and IPSS.  These notes generated with voice recognition software. I apologize for typographical errors.  Zara Council, [facility] [address] [place], [place] [address] [phone]

## 2016-02-12 ENCOUNTER — Ambulatory Visit: Payer: Medicare Other | Admitting: Urology

## 2016-02-12 ENCOUNTER — Telehealth: Payer: Self-pay

## 2016-02-12 LAB — BASIC METABOLIC PANEL
BUN/Creatinine Ratio: 18 (ref 10–24)
BUN: 23 mg/dL (ref 8–27)
CALCIUM: 9.6 mg/dL (ref 8.6–10.2)
CO2: 24 mmol/L (ref 18–29)
Chloride: 98 mmol/L (ref 96–106)
Creatinine, Ser: 1.27 mg/dL (ref 0.76–1.27)
GFR, EST AFRICAN AMERICAN: 66 mL/min/{1.73_m2} (ref >59)
GFR, EST NON AFRICAN AMERICAN: 57 mL/min/{1.73_m2} — AB (ref >59)
Glucose: 131 mg/dL — ABNORMAL HIGH (ref 65–99)
POTASSIUM: 4.4 mmol/L (ref 3.5–5.2)
SODIUM: 137 mmol/L (ref 134–144)

## 2016-02-12 NOTE — Telephone Encounter (Signed)
-----   Message from Nori Riis, PA-C sent at 02/12/2016  8:29 AM EDT ----- Patient's electrolytes are normal.

## 2016-02-12 NOTE — Telephone Encounter (Signed)
LMOM- most recent labs normal.

## 2016-02-13 ENCOUNTER — Ambulatory Visit (INDEPENDENT_AMBULATORY_CARE_PROVIDER_SITE_OTHER): Payer: Medicare Other | Admitting: Family Medicine

## 2016-02-13 ENCOUNTER — Encounter: Payer: Self-pay | Admitting: Family Medicine

## 2016-02-13 VITALS — BP 144/90 | HR 60 | Temp 97.8°F | Resp 16 | Wt 215.0 lb

## 2016-02-13 DIAGNOSIS — F32A Depression, unspecified: Secondary | ICD-10-CM

## 2016-02-13 DIAGNOSIS — G4700 Insomnia, unspecified: Secondary | ICD-10-CM | POA: Diagnosis not present

## 2016-02-13 DIAGNOSIS — R351 Nocturia: Secondary | ICD-10-CM | POA: Diagnosis not present

## 2016-02-13 DIAGNOSIS — F329 Major depressive disorder, single episode, unspecified: Secondary | ICD-10-CM | POA: Diagnosis not present

## 2016-02-13 LAB — CULTURE, URINE COMPREHENSIVE

## 2016-02-13 MED ORDER — MIRTAZAPINE 15 MG PO TABS: 15.0000 mg | ORAL_TABLET | Freq: Every day | ORAL | Status: DC

## 2016-02-13 MED ORDER — MIRTAZAPINE 7.5 MG PO TABS: ORAL_TABLET | ORAL | Status: DC

## 2016-02-13 NOTE — Progress Notes (Signed)
Patient: Brett Bender Male    DOB: 08/22/1946   70 y.o.   MRN: [id number] Visit Date: 02/13/2016  Today's Provider: Lelon Huh, MD   Chief Complaint  Patient presents with  . Depression   Subjective:    HPI Depression: Patient states he has been more Depressed lately due to his health problems. He states he cant focus and have a social conversation with people. Patient comes in requesting a prescription for Mirtazapine 15mg  at bedtime. Patient states he took this medication 4  Years ago for Depression and it worked well to control symptoms. Patient has an appointment to see a psychiatrist in [place] on Mar 16, 2016. Patient has also been having trouble staying asleep due to nocturia. Patient has been seen by a Urologist for this and discussed possible sleep apnea.    Depression screen [facility] 2/9 02/13/2016 11/19/2015  Decreased Interest 1 0  Down, Depressed, Hopeless 3 0  PHQ - 2 Score 4 0  Altered sleeping 3 -  Tired, decreased energy 3 -  Change in appetite 2 -  Feeling bad or failure about yourself  2 -  Trouble concentrating 3 -  Moving slowly or fidgety/restless 2 -  Suicidal thoughts 1 -  PHQ-9 Score 20 -  Difficult doing work/chores Very difficult -       Allergies  Allergen Reactions  . Penicillins     Other reaction(s): Stomach Ache   Previous Medications   ALPRAZOLAM (XANAX) 0.5 MG TABLET    1 tablet at night at needed for insomnia   ATENOLOL-CHLORTHALIDONE (TENORETIC) 50-25 MG TABLET    TAKE 1/2 TABLET BY MOUTH EVERY DAY   BUTALBITAL-ACETAMINOPHEN-CAFFEINE (FIORICET, ESGIC) 50-325-40 MG PER TABLET    TAKE 1-2 TABLET BY MOUTH EVERY SIX HOURS AS NEEDED FOR HEADACHE   CYCLOBENZAPRINE (FLEXERIL) 5 MG TABLET    Take 1 tablet by mouth 2 (two) times daily as needed. Reported on 02/11/2016   DICLOFENAC (CATAFLAM) 50 MG TABLET    TAKE 1 TABLET BY MOUTH TWICE DAILY   FINASTERIDE (PROSCAR) 5 MG TABLET    Take 1 tablet (5 mg total) by mouth daily.   FLUTICASONE  (FLONASE) 50 MCG/ACT NASAL SPRAY    Place 2 sprays into both nostrils daily.   HYDROCODONE-ACETAMINOPHEN (NORCO/VICODIN) 5-325 MG TABLET    Reported on 02/11/2016   NORTRIPTYLINE (PAMELOR) 25 MG CAPSULE    Reported on 02/11/2016   TAMSULOSIN (FLOMAX) 0.4 MG CAPS CAPSULE    Take 1 capsule (0.4 mg total) by mouth daily.   VALACYCLOVIR (VALTREX) 500 MG TABLET    TAKE 1 TABLET EVERY 12 HOURS AS NEEDED   ZOLPIDEM (AMBIEN) 5 MG TABLET    Take 1 tablet (5 mg total) by mouth at bedtime as needed.    Review of Systems  Constitutional: Negative for fever, chills and appetite change.  Respiratory: Negative for chest tightness, shortness of breath and wheezing.   Cardiovascular: Negative for chest pain and palpitations.  Gastrointestinal: Negative for nausea, vomiting and abdominal pain.  Psychiatric/Behavioral: Positive for confusion, sleep disturbance, dysphoric mood and decreased concentration. Negative for suicidal ideas, hallucinations and self-injury. The patient is nervous/anxious.     Social History  Substance Use Topics  . Smoking status: Never Smoker   . Smokeless tobacco: Not on file  . Alcohol Use: No   Objective:   BP 144/90 mmHg  Pulse 60  Temp(Src) 97.8 F (36.6 C) (Oral)  Resp 16  Wt 215 lb (97.523 kg)  SpO2 98%  Physical Exam  General appearance: alert, well developed, well nourished, cooperative and in no distress Head: Normocephalic, without obvious abnormality, atraumatic Lungs: Respirations even and unlabored Extremities: No gross deformities Skin: Skin color, texture, turgor normal. No rashes seen  Psych: Appropriate mood and affect. Neurologic: Mental status: Alert, oriented to person, place, and time, thought content appropriate.     Assessment & Plan:     1. Depression Did well on mirtazapine in the past. Restart titration up to 15mg . Already has appointment set up with psychiatrist. Call - mirtazapine (REMERON) 7.5 MG tablet; 1 at bedtime for 7 days, then  increase to 2 tablets at bedtime  Dispense: 30 tablet; Refill: 0 - mirtazapine (REMERON) 15 MG tablet; Take 1 tablet (15 mg total) by mouth at bedtime.  Dispense: 30 tablet; Refill: 1  2. Nocturia Followed by urology  3. Insomnia Likely secondary to depression and nocturia. He scores under 10 on Epworth. Consider o/n oximetry if not improving on mirtazapine.        Lelon Huh, MD  [facility]

## 2016-02-14 ENCOUNTER — Other Ambulatory Visit: Payer: Self-pay | Admitting: Family Medicine

## 2016-02-16 ENCOUNTER — Other Ambulatory Visit: Payer: Self-pay | Admitting: Family Medicine

## 2016-02-16 ENCOUNTER — Telehealth: Payer: Self-pay

## 2016-02-16 NOTE — Telephone Encounter (Signed)
-----   Message from Nori Riis, PA-C sent at 02/11/2016 12:50 PM EDT ----- His stones have not changed position, so they are not causing his urinary problems.  We will see him in 2 weeks.

## 2016-02-16 NOTE — Telephone Encounter (Signed)
Spoke with pt in reference to kidney stones. Pt voiced understanding.

## 2016-02-24 ENCOUNTER — Other Ambulatory Visit: Payer: Self-pay | Admitting: Family Medicine

## 2016-02-25 ENCOUNTER — Ambulatory Visit: Payer: Medicare Other | Admitting: Urology

## 2016-03-02 ENCOUNTER — Ambulatory Visit (INDEPENDENT_AMBULATORY_CARE_PROVIDER_SITE_OTHER): Payer: Medicare Other | Admitting: Urology

## 2016-03-02 ENCOUNTER — Encounter: Payer: Self-pay | Admitting: Urology

## 2016-03-02 VITALS — BP 158/83 | HR 63 | Ht 75.0 in | Wt 219.0 lb

## 2016-03-02 DIAGNOSIS — F419 Anxiety disorder, unspecified: Secondary | ICD-10-CM | POA: Diagnosis not present

## 2016-03-02 DIAGNOSIS — N200 Calculus of kidney: Secondary | ICD-10-CM | POA: Diagnosis not present

## 2016-03-02 DIAGNOSIS — R350 Frequency of micturition: Secondary | ICD-10-CM

## 2016-03-02 DIAGNOSIS — R351 Nocturia: Secondary | ICD-10-CM

## 2016-03-02 LAB — BLADDER SCAN AMB NON-IMAGING

## 2016-03-02 NOTE — Progress Notes (Signed)
9:43 AM   [name] 03-Mar-1946 [id number]  Referring provider: Birdie Sons, MD [address] [place], [place] [address]  Chief Complaint  Patient presents with  . Nephrolithiasis    litholink results    HPI: Patient is a 70 year old Caucasian male who presents today for his Litholink results and a 2 week follow up on his frequency and nocturia.    Nephrolithiasis Patient underwent left URS/LL/left ureteral stent placement with subsequent stent removal for a left distal (51mm) stone earlier this year. Follow up RUS confirmed resolution of the left hydronephrosis.  Bilateral nephrolithiasis is still present.  Stone composition was calcium oxalate dihydrate 30%, calcium oxalate monohydrate 60% and calcium phosphate 10%.  Litholink results noted he was at increased risk for stone formation due to borderline hyperoxaluria, hypocitraturia, high urine pH and mild hyperurlcosuria.  I question the patient concerning his vitamin supplementation. He states he just takes a 1 multi0-multivitamin daily.  He does not supplement with vitamin seen.  I discussed a low oxalate diet with the patient and gave him a pamphlet on the oxalate content of common foods.  I also encouraged the patient to have an intake and dietary calcium [phone] mg a day.  I gave him a handout on the calcium content of common foods.  I offered to start the patient on potassium citrate, but he declined at this time.  I also advised him to reduce his protein intake to 0.8-1.3 g per kg  per day and gave him a handout on the protein content of common foods.  Frequency Patient  Presented 2 weeks ago with a 1 month history of an increase in urinary frequency and nocturia.  Patient stated that 1 month ago he was given a nasal spray, cough syrup and a Z-pak for an upper respiratory infection.   He then noticed increasing urinary frequency and nocturia.  He also had an increase in his anxiety level as well.  He  currently takes Ambien for insomnia and has had the addition of alprazolam.  He does currently have a scheduled appointment with psychiatry on May  9th.  He is currently taking tamsulosin and finasteride.  His previous IPSS score was 25/4 and previous PVR was 0 mL.  His UA today was unremarkable at that visit.  Urine culture was also negative.   He does admit to drinking copious amount of water due to the dry mouth side effects of some of his medications.  Myrbetriq 25 mg daily samples were given at his last appointment.  He did not experience any untoward side effects with the medication.  He did note some modest improvement.  His current IPSS score is 20/3.  His PVR is 25 mL.    Nocturia He also stated at his last visit that  he's noticed increase from nocturia getting up one time a night to sometimes 3-4.  Not undergone a sleep study, but he states that a few years ago on went on a camping trip with some of his friends and they stated he had stopped breathing during the night.  He is a snorer.  He has a reduction of 3 times a night to 2 times a night with the Myrbetriq.  He has had upcoming appointment with Dr. Caryn Bender on May 10th and they will be discussing his possible sleep apnea at that time.    He is not experiencing dysuria, gross hematuria or suprapubic pain.  He is not experiencing  urgency or flank pain.        IPSS      02/11/16 1000 03/02/16 0900     International Prostate Symptom Score   How often have you had the sensation of not emptying your bladder? More than half the time About half the time    How often have you had to urinate less than every two hours? Almost always More than half the time    How often have you found you stopped and started again several times when you urinated? More than half the time Less than half the time    How often have you found it difficult to postpone urination? More than half the time More than half the time    How often have you had a weak urinary  stream? About half the time About half the time    How often have you had to strain to start urination? Less than half the time Less than half the time    How many times did you typically get up at night to urinate? 3 Times 2 Times    Total IPSS Score 25 20    Quality of Life due to urinary symptoms   If you were to spend the rest of your life with your urinary condition just the way it is now how would you feel about that? Mostly Disatisfied Mixed       Score:  1-7 Mild 8-19 Moderate 20-35 Severe   PMH: Past Medical History  Diagnosis Date  . History of MRSA infection   . Herpes simplex   . Hypertension   . Cancer (HCC)     Basal cell carcinoma  . Kidney stone   . Arthritis   . Enlarged prostate   . Complication of anesthesia     slow to wake up    Surgical History: Past Surgical History  Procedure Laterality Date  . Shoulder surgery Right 11/10/2011    arthroscopic surgery . Dr. Tamala Bender, [facility]  . Tonsillectomy    . Lower back surgery    . Neck surgery      Upper neck surgery  . Appendectomy    . Cystoscopy/ureteroscopy/holmium laser/stent placement Left 12/18/2015    Procedure: CYSTOSCOPY/URETEROSCOPY/HOLMIUM LASER LITHOTRIPSY;  Surgeon: Brett Espy, MD;  Location: [facility] ORS;  Service: Urology;  Laterality: Left;    Home Medications:    Medication List       This list is accurate as of: 03/02/16  9:43 AM.  Always use your most recent med list.               ALPRAZolam 0.5 MG tablet  Commonly known as:  XANAX  1 tablet at night at needed for insomnia     atenolol-chlorthalidone 50-25 MG tablet  Commonly known as:  TENORETIC  TAKE 1/2 TABLET BY MOUTH EVERY DAY     butalbital-acetaminophen-caffeine 50-325-40 MG tablet  Commonly known as:  FIORICET, ESGIC  TAKE 1-2 TABLET BY MOUTH EVERY SIX HOURS AS NEEDED FOR HEADACHE     cyclobenzaprine 5 MG tablet  Commonly known as:  FLEXERIL  Take 1 tablet by mouth 2 (two) times daily as needed. Reported on 02/11/2016      diclofenac 50 MG tablet  Commonly known as:  CATAFLAM  TAKE 1 TABLET BY MOUTH TWICE DAILY     finasteride 5 MG tablet  Commonly known as:  PROSCAR  Take 1 tablet (5 mg total) by mouth daily.     fluticasone 50 MCG/ACT nasal  spray  Commonly known as:  FLONASE  Place 2 sprays into both nostrils daily.     HYDROcodone-acetaminophen 5-325 MG tablet  Commonly known as:  NORCO/VICODIN  Reported on 02/11/2016     mirtazapine 15 MG tablet  Commonly known as:  REMERON  TAKE 1 TABLET BY MOUTH EVERY NIGHT AT BEDTIME     nortriptyline 25 MG capsule  Commonly known as:  PAMELOR  Reported on 02/11/2016     tamsulosin 0.4 MG Caps capsule  Commonly known as:  FLOMAX  Take 1 capsule (0.4 mg total) by mouth daily.     valACYclovir 500 MG tablet  Commonly known as:  VALTREX  TAKE 1 TABLET EVERY 12 HOURS AS NEEDED     zolpidem 5 MG tablet  Commonly known as:  AMBIEN  Take 1 tablet (5 mg total) by mouth at bedtime as needed.        Allergies:  Allergies  Allergen Reactions  . Penicillins     Other reaction(s): Stomach Ache    Family History: Family History  Problem Relation Age of Onset  . Diabetes Neg Hx   . Cancer Neg Hx   . Heart attack Neg Hx   . Kidney disease Neg Hx   . Prostate cancer Neg Hx     Social History:  reports that he has never smoked. He does not have any smokeless tobacco history on file. He reports that he does not drink alcohol or use illicit drugs.  ROS: UROLOGY Frequent Urination?: No Hard to postpone urination?: No Burning/pain with urination?: No Get up at night to urinate?: Yes Leakage of urine?: No Urine stream starts and stops?: No Trouble starting stream?: No Do you have to strain to urinate?: No Blood in urine?: No Urinary tract infection?: No Sexually transmitted disease?: No Injury to kidneys or bladder?: No Painful intercourse?: No Weak stream?: No Erection problems?: No Penile pain?: No  Gastrointestinal Nausea?:  No Vomiting?: No Indigestion/heartburn?: No Diarrhea?: No Constipation?: No  Constitutional Fever: No Night sweats?: No Weight loss?: No Fatigue?: No  Skin Skin rash/lesions?: No Itching?: No  Eyes Blurred vision?: No Double vision?: No  Ears/Nose/Throat Sore throat?: No Sinus problems?: No  Hematologic/Lymphatic Swollen glands?: No Easy bruising?: No  Cardiovascular Leg swelling?: No Chest pain?: No  Respiratory Cough?: No Shortness of breath?: No  Endocrine Excessive thirst?: No  Musculoskeletal Back pain?: No Joint pain?: No  Neurological Headaches?: Yes Dizziness?: No  Psychologic Depression?: Yes Anxiety?: No  Physical Exam: BP 158/83 mmHg  Pulse 63  Ht 6\' 3"  (1.905 m)  Wt 219 lb (99.338 kg)  BMI 27.37 kg/m2  Constitutional: Well nourished. Alert and oriented, No acute distress. HEENT: [place] AT, moist mucus membranes. Trachea midline, no masses. Cardiovascular: No clubbing, cyanosis, or edema. Respiratory: Normal respiratory effort, no increased work of breathing. Skin: No rashes, bruises or suspicious lesions. Lymph: No cervical or inguinal adenopathy. Neurologic: Grossly intact, no focal deficits, moving all 4 extremities. Psychiatric: Normal mood and affect.  Laboratory Data: Lab Results  Component Value Date   WBC 8.1 12/17/2015   HGB 15.0 12/17/2015   HCT 45.1 12/17/2015   MCV 94.6 12/17/2015   PLT 213 12/17/2015    Lab Results  Component Value Date   CREATININE 1.27 02/11/2016    Lab Results  Component Value Date   PSA 0.1 11/28/2014   PSA History  <0.1 ng/mL on 11/24/2015   Lab Results  Component Value Date   TSH 1.02 02/27/2012  Component Value Date/Time   CHOL 202* 11/20/2015 0802   CHOL 195 11/28/2014   HDL 50 11/20/2015 0802   HDL 47 11/28/2014   CHOLHDL 4.0 11/20/2015 0802   LDLCALC 127* 11/20/2015 0802   LDLCALC 115 11/28/2014    Lab Results  Component Value Date   AST 24 02/27/2012    Lab Results  Component Value Date   ALT 31 02/27/2012    Pertinent Imaging: Results for Brett Bender, Brett Bender (MRN [id number]) as of 03/03/2016 10:00  Ref. Range 03/02/2016 09:14  Scan Result Unknown 80ml     Assessment & Plan:    1. Urinary frequency:   Patient is quite anxious during today's exam.  He could not get in sooner with Dr. Nicolasa Ducking, so he is going to keep his May 9th appointment with Psychiatry.  He found the Myrbetriq somewhat effective in improving his symptoms.  He did not feel it was effective enough to continue the medication.  He will continue the tamsulosin 0.4 mg daily and the finasteride 5 mg daily.  He will RTC in one year for IPSS score, exam, PSA, KUB and UA.    2. Nocturia:   Patient's BMP was normal from last visit.  He has an upcoming appointment with his PCP to discuss possible sleep apnea.    3. Nephrolithiasis:    KUB from last visit demonstrated the stability of the bilateral stones.  He is not interested in starting potassium citrate at this time.  He would like to follow the dietary suggestions.  He will RTC in one year for KUB and office visit.  He will contact us in the interim if he should experience hematuria or flank pain.    4. Anxiety:   He has an upcoming appointment with psychiatry.    Return in about 1 year (around 03/02/2017) for KUB, UA, IPSS score and exam.  These notes generated with voice recognition software. I apologize for typographical errors.  Brett Bender, [facility] [address] [place], [place] [address] [phone]

## 2016-03-16 ENCOUNTER — Ambulatory Visit (INDEPENDENT_AMBULATORY_CARE_PROVIDER_SITE_OTHER): Payer: Medicare Other | Admitting: Family Medicine

## 2016-03-16 ENCOUNTER — Telehealth: Payer: Self-pay

## 2016-03-16 ENCOUNTER — Encounter: Payer: Self-pay | Admitting: Family Medicine

## 2016-03-16 VITALS — BP 158/90 | HR 62 | Temp 98.1°F | Resp 16 | Wt 217.0 lb

## 2016-03-16 DIAGNOSIS — F329 Major depressive disorder, single episode, unspecified: Secondary | ICD-10-CM | POA: Diagnosis not present

## 2016-03-16 DIAGNOSIS — I10 Essential (primary) hypertension: Secondary | ICD-10-CM | POA: Diagnosis not present

## 2016-03-16 DIAGNOSIS — F32A Depression, unspecified: Secondary | ICD-10-CM

## 2016-03-16 DIAGNOSIS — G4700 Insomnia, unspecified: Secondary | ICD-10-CM | POA: Diagnosis not present

## 2016-03-16 MED ORDER — AMLODIPINE BESYLATE 2.5 MG PO TABS: ORAL_TABLET | ORAL | Status: DC

## 2016-03-16 NOTE — Progress Notes (Signed)
Patient: Brett Bender Male    DOB: 1946/09/09   70 y.o.   MRN: [id number] Visit Date: 03/16/2016  Today's Provider: Lelon Huh, MD   Chief Complaint  Patient presents with  . Follow-up  . Hypertension  . Depression  . Insomnia   Subjective:    HPI   Follow-up for depression from 02/13/2016; restarted mirtazapine (REMERON) 15 MG qd. Patient advised to follow-up with psychiatrist who he saw this morning and BP was noted to be 180/99. They recommended that patient called and advise his PCP. He states that he drink 4-5  Cups of regular coffee every morning after working out. Psychiatrist is working on getting him to wean caffeine and Ambien. He states he feels fine otherwise, has not had any chest pains or headaches.     Hypertension, follow-up:  BP Readings from Last 3 Encounters:  03/16/16 150/90  03/02/16 158/83  02/13/16 144/90    He was last seen for hypertension 5 months ago.  BP at that visit was 148/82. Management since that visit includes; no changes.He reports  ----------------------------------------------------------------------   Allergies  Allergen Reactions  . Penicillins     Other reaction(s): Stomach Ache   Previous Medications   ALPRAZOLAM (XANAX) 0.5 MG TABLET    1 tablet at night at needed for insomnia   ATENOLOL-CHLORTHALIDONE (TENORETIC) 50-25 MG TABLET    TAKE 1/2 TABLET BY MOUTH EVERY DAY   BUTALBITAL-ACETAMINOPHEN-CAFFEINE (FIORICET, ESGIC) 50-325-40 MG PER TABLET    TAKE 1-2 TABLET BY MOUTH EVERY SIX HOURS AS NEEDED FOR HEADACHE   CYCLOBENZAPRINE (FLEXERIL) 5 MG TABLET    Take 1 tablet by mouth 2 (two) times daily as needed. Reported on 02/11/2016   DICLOFENAC (CATAFLAM) 50 MG TABLET    TAKE 1 TABLET BY MOUTH TWICE DAILY   FINASTERIDE (PROSCAR) 5 MG TABLET    Take 1 tablet (5 mg total) by mouth daily.   FLUTICASONE (FLONASE) 50 MCG/ACT NASAL SPRAY    Place 2 sprays into both nostrils daily.   HYDROCODONE-ACETAMINOPHEN (NORCO/VICODIN)  5-325 MG TABLET    Reported on 03/16/2016   MIRTAZAPINE (REMERON) 15 MG TABLET    TAKE 1 TABLET BY MOUTH EVERY NIGHT AT BEDTIME   NORTRIPTYLINE (PAMELOR) 25 MG CAPSULE    Reported on 03/16/2016   TAMSULOSIN (FLOMAX) 0.4 MG CAPS CAPSULE    Take 1 capsule (0.4 mg total) by mouth daily.   VALACYCLOVIR (VALTREX) 500 MG TABLET    TAKE 1 TABLET EVERY 12 HOURS AS NEEDED   ZOLPIDEM (AMBIEN) 5 MG TABLET    Take 1 tablet (5 mg total) by mouth at bedtime as needed.    Review of Systems  Constitutional: Negative for fever, chills and appetite change.  Respiratory: Negative for chest tightness, shortness of breath and wheezing.   Cardiovascular: Negative for chest pain and palpitations.  Gastrointestinal: Negative for nausea, vomiting and abdominal pain.    Social History  Substance Use Topics  . Smoking status: Never Smoker   . Smokeless tobacco: Not on file  . Alcohol Use: No   Objective:   BP 150/90 mmHg  Pulse 62  Temp(Src) 98.1 F (36.7 C) (Oral)  Resp 16  Wt 217 lb (98.431 kg)  SpO2 95%  Physical Exam   General Appearance:    Alert, cooperative, no distress  Eyes:    PERRL, conjunctiva/corneas clear, EOM's intact       Lungs:     Clear to auscultation bilaterally, respirations unlabored  Heart:  Regular rate and rhythm  Neurologic:   Awake, alert, oriented x 3. No apparent focal neurological           defect.           Assessment & Plan:     1. Essential (primary) hypertension Likely very elevated this morning due to excessive caffeine consumption. He is going to wean caffeine and start amlodipine for now.  - amLODipine (NORVASC) 2.5 MG tablet; One every evening  Dispense: 30 tablet; Refill: 1  Follow up BP check in a month.  2. Insomnia Doing well with mirtazapam and will be working with psychiatrist to wean Ambien. He is considering getting a sleep study done if tiredness does not improve.   3. Depression Doing well with mirtazapine Continue regular follow up with  psychiatrist.         Lelon Huh, MD  [place]

## 2016-03-16 NOTE — Telephone Encounter (Signed)
LMOVM cell and home for pt to return call.

## 2016-03-16 NOTE — Telephone Encounter (Signed)
Pt returned call. I advised pt and scheduled appt for 345 today. Thanks TNP

## 2016-03-16 NOTE — Telephone Encounter (Signed)
That's pretty high... He should really come in this afternoon so we can recheck it and see if it is staying high.

## 2016-03-16 NOTE — Telephone Encounter (Signed)
Patient called saying that he just left his appt with his psychiatrist, and his BP was 180/99. They recommended that patient called and advise his PCP. Patient reports that he is asymptomatic. He denies any chest pain, headache, dizziness, blurred vision, shortness of breath or swelling. Patient reports that he does have hypertension and he took his medication this morning. Patient has an appt scheduled with you tomorrow at 11am. Does he need to come in sooner? Please advise. Thanks!

## 2016-03-17 ENCOUNTER — Ambulatory Visit: Payer: Medicare Other | Admitting: Family Medicine

## 2016-04-13 ENCOUNTER — Other Ambulatory Visit: Payer: Self-pay | Admitting: Family Medicine

## 2016-04-14 ENCOUNTER — Ambulatory Visit (INDEPENDENT_AMBULATORY_CARE_PROVIDER_SITE_OTHER): Payer: Medicare Other | Admitting: Family Medicine

## 2016-04-14 ENCOUNTER — Encounter: Payer: Self-pay | Admitting: Family Medicine

## 2016-04-14 VITALS — BP 118/78 | HR 57 | Temp 97.5°F | Resp 16 | Ht 75.0 in | Wt 225.0 lb

## 2016-04-14 DIAGNOSIS — I10 Essential (primary) hypertension: Secondary | ICD-10-CM | POA: Diagnosis not present

## 2016-04-14 DIAGNOSIS — F32A Depression, unspecified: Secondary | ICD-10-CM

## 2016-04-14 DIAGNOSIS — F329 Major depressive disorder, single episode, unspecified: Secondary | ICD-10-CM

## 2016-04-14 MED ORDER — ESTROGENS CONJUGATED 0.3 MG PO TABS: 0.3000 mg | ORAL_TABLET | Freq: Every day | ORAL | Status: DC

## 2016-04-14 MED ORDER — AMLODIPINE BESYLATE 2.5 MG PO TABS: ORAL_TABLET | ORAL | Status: DC

## 2016-04-14 NOTE — Progress Notes (Signed)
Patient: Brett Bender Male    DOB: 03-15-1946   70 y.o.   MRN: [id number] Visit Date: 04/14/2016  Today's Provider: Lelon Huh, MD   Chief Complaint  Patient presents with  . Follow-up  . Hypertension   Subjective:    HPI     Hypertension, follow-up:  BP Readings from Last 3 Encounters:  04/14/16 118/78  03/16/16 158/90  03/02/16 158/83    He was last seen for hypertension 1 months ago.  BP at that visit was 150/90. Management since that visit includes; started amlodipine 2.5 mg qd. Patient advised to wean off caffeine .He reports good compliance with treatment. He is not having side effects. none  He is exercising. He is adherent to low salt diet.   Outside blood pressures are none. He is experiencing none.  Patient denies none.   Cardiovascular risk factors include none.  Use of agents associated with hypertension: none.   ----------------------------------------------------------------------     Allergies  Allergen Reactions  . Penicillins     Other reaction(s): Stomach Ache   Previous Medications   ALPRAZOLAM (XANAX) 0.5 MG TABLET    1 tablet at night at needed for insomnia   ATENOLOL-CHLORTHALIDONE (TENORETIC) 50-25 MG TABLET    TAKE 1/2 TABLET BY MOUTH EVERY DAY   BUTALBITAL-ACETAMINOPHEN-CAFFEINE (FIORICET, ESGIC) 50-325-40 MG PER TABLET    TAKE 1-2 TABLET BY MOUTH EVERY SIX HOURS AS NEEDED FOR HEADACHE   CYCLOBENZAPRINE (FLEXERIL) 5 MG TABLET    Take 1 tablet by mouth 2 (two) times daily as needed. Reported on 02/11/2016   DICLOFENAC (CATAFLAM) 50 MG TABLET    TAKE 1 TABLET BY MOUTH TWICE DAILY   FINASTERIDE (PROSCAR) 5 MG TABLET    Take 1 tablet (5 mg total) by mouth daily.   FLUTICASONE (FLONASE) 50 MCG/ACT NASAL SPRAY    Place 2 sprays into both nostrils daily.   HYDROCODONE-ACETAMINOPHEN (NORCO/VICODIN) 5-325 MG TABLET    Reported on 03/16/2016   MIRTAZAPINE (REMERON) 15 MG TABLET    TAKE 2 TABLET BY MOUTH EVERY NIGHT AT BEDTIME   NORTRIPTYLINE (PAMELOR) 25 MG CAPSULE    Reported on 03/16/2016   TAMSULOSIN (FLOMAX) 0.4 MG CAPS CAPSULE    Take 1 capsule (0.4 mg total) by mouth daily.   VALACYCLOVIR (VALTREX) 500 MG TABLET    TAKE 1 TABLET EVERY 12 HOURS AS NEEDED    Review of Systems  Constitutional: Negative for fever, chills and appetite change.  Respiratory: Negative for chest tightness, shortness of breath and wheezing.   Cardiovascular: Negative for chest pain and palpitations.  Gastrointestinal: Negative for nausea, vomiting and abdominal pain.    Social History  Substance Use Topics  . Smoking status: Never Smoker   . Smokeless tobacco: Not on file  . Alcohol Use: No   Objective:   BP 118/78 mmHg  Pulse 57  Temp(Src) 97.5 F (36.4 C) (Oral)  Resp 16  Ht 6\' 3"  (1.905 m)  Wt 225 lb (102.059 kg)  BMI 28.12 kg/m2  SpO2 96%  Physical Exam   General Appearance:    Alert, cooperative, no distress  Eyes:    PERRL, conjunctiva/corneas clear, EOM's intact       Lungs:     Clear to auscultation bilaterally, respirations unlabored  Heart:    Regular rate and rhythm  Neurologic:   Awake, alert, oriented x 3. No apparent focal neurological           defect.  Assessment & Plan:     1. Essential (primary) hypertension Well controlled.  Continue current medications.   - amLODipine (NORVASC) 2.5 MG tablet; One every evening  Dispense: 30 tablet; Refill: 6  Return in 4 months for BP check.   2. Depression Doing much better, continue routine follow up with psychiatry          Lelon Huh, MD  [place]

## 2016-04-27 DIAGNOSIS — F329 Major depressive disorder, single episode, unspecified: Secondary | ICD-10-CM | POA: Diagnosis not present

## 2016-05-13 ENCOUNTER — Other Ambulatory Visit: Payer: Self-pay | Admitting: Family Medicine

## 2016-05-19 DIAGNOSIS — L578 Other skin changes due to chronic exposure to nonionizing radiation: Secondary | ICD-10-CM | POA: Diagnosis not present

## 2016-05-19 DIAGNOSIS — L812 Freckles: Secondary | ICD-10-CM | POA: Diagnosis not present

## 2016-05-19 DIAGNOSIS — L820 Inflamed seborrheic keratosis: Secondary | ICD-10-CM | POA: Diagnosis not present

## 2016-05-19 DIAGNOSIS — L821 Other seborrheic keratosis: Secondary | ICD-10-CM | POA: Diagnosis not present

## 2016-05-19 DIAGNOSIS — L570 Actinic keratosis: Secondary | ICD-10-CM | POA: Diagnosis not present

## 2016-05-19 DIAGNOSIS — Z85828 Personal history of other malignant neoplasm of skin: Secondary | ICD-10-CM | POA: Diagnosis not present

## 2016-07-14 DIAGNOSIS — F329 Major depressive disorder, single episode, unspecified: Secondary | ICD-10-CM | POA: Diagnosis not present

## 2016-07-28 DIAGNOSIS — L570 Actinic keratosis: Secondary | ICD-10-CM | POA: Diagnosis not present

## 2016-07-28 DIAGNOSIS — L578 Other skin changes due to chronic exposure to nonionizing radiation: Secondary | ICD-10-CM | POA: Diagnosis not present

## 2016-07-28 DIAGNOSIS — Z85828 Personal history of other malignant neoplasm of skin: Secondary | ICD-10-CM | POA: Diagnosis not present

## 2016-07-28 DIAGNOSIS — L820 Inflamed seborrheic keratosis: Secondary | ICD-10-CM | POA: Diagnosis not present

## 2016-08-10 ENCOUNTER — Ambulatory Visit (INDEPENDENT_AMBULATORY_CARE_PROVIDER_SITE_OTHER): Payer: Medicare Other | Admitting: Family Medicine

## 2016-08-10 ENCOUNTER — Encounter: Payer: Self-pay | Admitting: Family Medicine

## 2016-08-10 VITALS — BP 134/80 | HR 54 | Temp 97.8°F | Resp 16 | Wt 234.0 lb

## 2016-08-10 DIAGNOSIS — G4700 Insomnia, unspecified: Secondary | ICD-10-CM

## 2016-08-10 DIAGNOSIS — I10 Essential (primary) hypertension: Secondary | ICD-10-CM

## 2016-08-10 DIAGNOSIS — Z23 Encounter for immunization: Secondary | ICD-10-CM | POA: Diagnosis not present

## 2016-08-10 NOTE — Progress Notes (Signed)
Patient: Brett Bender Male    DOB: 09-Sep-1946   70 y.o.   MRN: [id number] Visit Date: 08/10/2016  Today's Provider: Lelon Huh, MD   Chief Complaint  Patient presents with  . Hypertension    follow up  . Depression    follow up  . Insomnia    follow up   Subjective:    HPI  Hypertension, follow-up:  BP Readings from Last 3 Encounters:  04/14/16 118/78  03/16/16 (!) 158/90  03/02/16 (!) 158/83    He was last seen for hypertension 3 months ago.  BP at that visit was 118/78. Management since that visit includes no changes. He reports good compliance with treatment. He is not having side effects.  He is exercising. He is not adherent to low salt diet.   Outside blood pressures are not being checked. He is experiencing none.  Patient denies chest pain, chest pressure/discomfort, claudication, dyspnea, exertional chest pressure/discomfort, fatigue, irregular heart beat, lower extremity edema, near-syncope, orthopnea, palpitations, paroxysmal nocturnal dyspnea, syncope and tachypnea.   Cardiovascular risk factors include advanced age (older than 73 for men, 43 for women), hypertension and male gender.  Use of agents associated with hypertension: none.     Weight trend: increasing steadily Wt Readings from Last 3 Encounters:  04/14/16 225 lb (102.1 kg)  03/16/16 217 lb (98.4 kg)  03/02/16 219 lb (99.3 kg)    Current diet: well balanced   Follow up Depression:  Patient was last seen for this problem 3 months ago. No changes were made. Patient was advised to continue follow up with Psychiatry. Patient repots god compliance with treatment.  Follow up Insomnia:  Patient was last seen 4 months ago and no changes were made.  Patient reports good compliance with treatment.     Allergies  Allergen Reactions  . Penicillins     Other reaction(s): Stomach Ache     Current Outpatient Prescriptions:  .  ALPRAZolam (XANAX) 0.5 MG tablet, 1 tablet at night at  needed for insomnia, Disp: 30 tablet, Rfl: 0 .  amLODipine (NORVASC) 2.5 MG tablet, One every evening, Disp: 30 tablet, Rfl: 6 .  atenolol-chlorthalidone (TENORETIC) 50-25 MG tablet, TAKE 1/2 TABLET BY MOUTH EVERY DAY, Disp: 45 tablet, Rfl: 3 .  butalbital-acetaminophen-caffeine (FIORICET, ESGIC) 50-325-40 MG per tablet, TAKE 1-2 TABLET BY MOUTH EVERY SIX HOURS AS NEEDED FOR HEADACHE, Disp: 20 tablet, Rfl: 3 .  cyclobenzaprine (FLEXERIL) 5 MG tablet, Take 1 tablet by mouth 2 (two) times daily as needed. Reported on 02/11/2016, Disp: , Rfl:  .  diclofenac (CATAFLAM) 50 MG tablet, TAKE 1 TABLET BY MOUTH TWICE DAILY, Disp: 60 tablet, Rfl: 3 .  estrogens, conjugated, (PREMARIN) 0.3 MG tablet, Take 1 tablet (0.3 mg total) by mouth daily. Take daily for 21 days then do not take for 7 days., Disp: 1 tablet, Rfl: 1 .  finasteride (PROSCAR) 5 MG tablet, Take 1 tablet (5 mg total) by mouth daily., Disp: 90 tablet, Rfl: 3 .  fluticasone (FLONASE) 50 MCG/ACT nasal spray, Place 2 sprays into both nostrils daily., Disp: 16 g, Rfl: 6 .  HYDROcodone-acetaminophen (NORCO/VICODIN) 5-325 MG tablet, Reported on 03/16/2016, Disp: , Rfl: 0 .  nortriptyline (PAMELOR) 25 MG capsule, Reported on 03/16/2016, Disp: , Rfl: 3 .  tamsulosin (FLOMAX) 0.4 MG CAPS capsule, Take 1 capsule (0.4 mg total) by mouth daily., Disp: 90 capsule, Rfl: 3 .  valACYclovir (VALTREX) 500 MG tablet, TAKE 1 TABLET EVERY 12 HOURS AS  NEEDED, Disp: 20 tablet, Rfl: 3 .  mirtazapine (REMERON) 30 MG tablet, Take 1 tablet by mouth daily., Disp: , Rfl:   Review of Systems  Constitutional: Negative for appetite change, chills and fever.  Respiratory: Negative for chest tightness, shortness of breath and wheezing.   Cardiovascular: Negative for chest pain and palpitations.  Gastrointestinal: Negative for abdominal pain, nausea and vomiting.    Social History  Substance Use Topics  . Smoking status: Never Smoker  . Smokeless tobacco: Never Used  . Alcohol  use No   Objective:   BP 134/80 (BP Location: Left Arm, Patient Position: Sitting, Cuff Size: Large)   Pulse (!) 54   Temp 97.8 F (36.6 C) (Oral)   Resp 16   Wt 234 lb (106.1 kg)   BMI 29.25 kg/m   Physical Exam   General Appearance:    Alert, cooperative, no distress  Eyes:    PERRL, conjunctiva/corneas clear, EOM's intact       Lungs:     Clear to auscultation bilaterally, respirations unlabored  Heart:    Regular rate and rhythm  Neurologic:   Awake, alert, oriented x 3. No apparent focal neurological           defect.          Assessment & Plan:     1. Essential (primary) hypertension Well controlled.  Continue current medications.    2. Need for influenza vaccination  - Flu vaccine HIGH DOSE PF (Fluzone High dose)  3. Insomnia, unspecified type Doing well with prn alprazolam hs.      The entirety of the information documented in the History of Present Illness, Review of Systems and Physical Exam were personally obtained by me. Portions of this information were initially documented by Meyer Cory, CMA and reviewed by me for thoroughness and accuracy.    Lelon Huh, MD  [facility]

## 2016-08-11 DIAGNOSIS — H40003 Preglaucoma, unspecified, bilateral: Secondary | ICD-10-CM | POA: Diagnosis not present

## 2016-08-11 DIAGNOSIS — H35033 Hypertensive retinopathy, bilateral: Secondary | ICD-10-CM | POA: Diagnosis not present

## 2016-08-11 DIAGNOSIS — H43813 Vitreous degeneration, bilateral: Secondary | ICD-10-CM | POA: Diagnosis not present

## 2016-10-12 DIAGNOSIS — F329 Major depressive disorder, single episode, unspecified: Secondary | ICD-10-CM | POA: Diagnosis not present

## 2016-10-19 ENCOUNTER — Other Ambulatory Visit: Payer: Self-pay | Admitting: *Deleted

## 2016-10-19 MED ORDER — BUTALBITAL-APAP-CAFFEINE 50-325-40 MG PO TABS: 1.0000 | ORAL_TABLET | Freq: Four times a day (QID) | ORAL | 3 refills | Status: DC | PRN

## 2016-10-19 NOTE — Telephone Encounter (Signed)
Please call in Fioricet.  

## 2016-10-19 NOTE — Telephone Encounter (Signed)
Rx called in to pharmacy. 

## 2016-11-09 DIAGNOSIS — L570 Actinic keratosis: Secondary | ICD-10-CM | POA: Diagnosis not present

## 2016-11-18 ENCOUNTER — Other Ambulatory Visit: Payer: Medicare Other

## 2016-11-24 ENCOUNTER — Ambulatory Visit: Payer: Medicare Other | Admitting: Family Medicine

## 2016-11-24 ENCOUNTER — Ambulatory Visit: Payer: Medicare Other

## 2016-11-25 ENCOUNTER — Ambulatory Visit: Payer: Medicare Other | Admitting: Urology

## 2016-11-29 ENCOUNTER — Other Ambulatory Visit: Payer: Medicare Other

## 2016-12-06 DIAGNOSIS — M79672 Pain in left foot: Secondary | ICD-10-CM | POA: Diagnosis not present

## 2016-12-06 DIAGNOSIS — M76822 Posterior tibial tendinitis, left leg: Secondary | ICD-10-CM | POA: Diagnosis not present

## 2016-12-06 DIAGNOSIS — B351 Tinea unguium: Secondary | ICD-10-CM | POA: Diagnosis not present

## 2016-12-06 DIAGNOSIS — M79675 Pain in left toe(s): Secondary | ICD-10-CM | POA: Diagnosis not present

## 2016-12-11 ENCOUNTER — Other Ambulatory Visit: Payer: Self-pay | Admitting: Family Medicine

## 2016-12-14 ENCOUNTER — Other Ambulatory Visit: Payer: Self-pay | Admitting: Urology

## 2016-12-14 DIAGNOSIS — N138 Other obstructive and reflux uropathy: Secondary | ICD-10-CM

## 2016-12-14 DIAGNOSIS — N401 Enlarged prostate with lower urinary tract symptoms: Principal | ICD-10-CM

## 2016-12-17 ENCOUNTER — Ambulatory Visit (INDEPENDENT_AMBULATORY_CARE_PROVIDER_SITE_OTHER): Payer: Medicare Other | Admitting: Family Medicine

## 2016-12-17 ENCOUNTER — Encounter: Payer: Self-pay | Admitting: Family Medicine

## 2016-12-17 ENCOUNTER — Ambulatory Visit (INDEPENDENT_AMBULATORY_CARE_PROVIDER_SITE_OTHER): Payer: Medicare Other

## 2016-12-17 VITALS — BP 130/83 | HR 68 | Temp 98.6°F | Ht 75.0 in | Wt 237.2 lb

## 2016-12-17 DIAGNOSIS — Z1159 Encounter for screening for other viral diseases: Secondary | ICD-10-CM | POA: Diagnosis not present

## 2016-12-17 DIAGNOSIS — I10 Essential (primary) hypertension: Secondary | ICD-10-CM | POA: Diagnosis not present

## 2016-12-17 DIAGNOSIS — G4700 Insomnia, unspecified: Secondary | ICD-10-CM | POA: Diagnosis not present

## 2016-12-17 DIAGNOSIS — Z0000 Encounter for general adult medical examination without abnormal findings: Secondary | ICD-10-CM

## 2016-12-17 DIAGNOSIS — Z125 Encounter for screening for malignant neoplasm of prostate: Secondary | ICD-10-CM | POA: Diagnosis not present

## 2016-12-17 NOTE — Progress Notes (Signed)
Subjective:   Brett Bender is a 71 y.o. male who presents for Medicare Annual/Subsequent preventive examination.  Review of Systems:  N/A  Cardiac Risk Factors include: advanced age (>38men, >32 women);hypertension;male gender     Objective:    Vitals: BP 130/83 (BP Location: Right Arm)   Pulse 68   Temp 98.6 F (37 C) (Oral)   Ht 6\' 3"  (1.905 m)   Wt 237 lb 3.2 oz (107.6 kg)   BMI 29.65 kg/m   Body mass index is 29.65 kg/m.  Tobacco History  Smoking Status  . Never Smoker  Smokeless Tobacco  . Never Used     Counseling given: Not Answered   Past Medical History:  Diagnosis Date  . Arthritis   . Cancer (HCC)    Basal cell carcinoma  . Complication of anesthesia    slow to wake up  . Enlarged prostate   . Herpes simplex   . History of MRSA infection   . Hypertension   . Kidney stone   . Tendonitis of ankle, left    Past Surgical History:  Procedure Laterality Date  . APPENDECTOMY    . CYSTOSCOPY/URETEROSCOPY/HOLMIUM LASER/STENT PLACEMENT Left 12/18/2015   Procedure: CYSTOSCOPY/URETEROSCOPY/HOLMIUM LASER LITHOTRIPSY;  Surgeon: Hollice Espy, MD;  Location: [facility] ORS;  Service: Urology;  Laterality: Left;  . LOWER BACK SURGERY    . NECK SURGERY     Upper neck surgery  . SHOULDER SURGERY Right 11/10/2011   arthroscopic surgery . Dr. Tamala Julian, [facility]  . TONSILLECTOMY     Family History  Problem Relation Age of Onset  . Diabetes Neg Hx   . Cancer Neg Hx   . Heart attack Neg Hx   . Kidney disease Neg Hx   . Prostate cancer Neg Hx    History  Sexual Activity  . Sexual activity: Not on file    Outpatient Encounter Prescriptions as of 12/17/2016  Medication Sig  . amLODipine (NORVASC) 2.5 MG tablet One every evening  . atenolol-chlorthalidone (TENORETIC) 50-25 MG tablet TAKE 1/2 TABLET BY MOUTH EVERY DAY  . butalbital-acetaminophen-caffeine (FIORICET, ESGIC) 50-325-40 MG tablet Take 1-2 tablets by mouth every 6 (six) hours as needed for headache.  .  cyclobenzaprine (FLEXERIL) 5 MG tablet Take 1 tablet by mouth 2 (two) times daily as needed. Reported on 02/11/2016  . diclofenac (CATAFLAM) 50 MG tablet TAKE 1 TABLET BY MOUTH TWICE A DAY  . finasteride (PROSCAR) 5 MG tablet TAKE 1 TABLET BY MOUTH ONCE A DAY  . fluticasone (FLONASE) 50 MCG/ACT nasal spray Place 2 sprays into both nostrils daily. (Patient taking differently: Place 2 sprays into both nostrils daily. )  . mirtazapine (REMERON) 30 MG tablet Take 1 tablet by mouth daily.  . tamsulosin (FLOMAX) 0.4 MG CAPS capsule Take 1 capsule (0.4 mg total) by mouth daily.  . valACYclovir (VALTREX) 500 MG tablet TAKE 1 TABLET EVERY 12 HOURS AS NEEDED  . ALPRAZolam (XANAX) 0.5 MG tablet 1 tablet at night at needed for insomnia (Patient not taking: Reported on 12/17/2016)  . estrogens, conjugated, (PREMARIN) 0.3 MG tablet Take 1 tablet (0.3 mg total) by mouth daily. Take daily for 21 days then do not take for 7 days. (Patient not taking: Reported on 12/17/2016)  . HYDROcodone-acetaminophen (NORCO/VICODIN) 5-325 MG tablet Reported on 03/16/2016  . nortriptyline (PAMELOR) 25 MG capsule Reported on 03/16/2016   No facility-administered encounter medications on file as of 12/17/2016.     Activities of Daily Living In your present state of health,  do you have any difficulty performing the following activities: 12/17/2016  Hearing? N  Vision? N  Difficulty concentrating or making decisions? N  Walking or climbing stairs? N  Dressing or bathing? N  Doing errands, shopping? N  Preparing Food and eating ? N  Using the Toilet? N  In the past six months, have you accidently leaked urine? Y  Do you have problems with loss of bowel control? N  Managing your Medications? N  Managing your Finances? N  Housekeeping or managing your Housekeeping? N  Some recent data might be hidden    Patient Care Team: Birdie Sons, MD as PCP - General (Family Medicine) Sharlotte Alamo, DPM as Consulting Physician (Podiatry) Nori Riis, PA-C as Physician Assistant (Urology)   Assessment:   Exercise Activities and Dietary recommendations Current Exercise Habits: Structured exercise class, Type of exercise: strength training/weights;walking;treadmill;stretching, Time (Minutes): > 60 (2 hours), Frequency (Times/Week): 6, Weekly Exercise (Minutes/Week): 0, Intensity: Mild  Goals    . Exercise           Starting 2018, I will continue to exercise 6 days a week for 2 hours.       Fall Risk Fall Risk  12/17/2016 11/19/2015  Falls in the past year? No No   Depression Screen PHQ 2/9 Scores 12/17/2016 02/13/2016 11/19/2015  PHQ - 2 Score 1 4 0  PHQ- 9 Score - 20 -    Cognitive Function     6CIT Screen 12/17/2016  What Year? 0 points  What month? 0 points  What time? 0 points  Count back from 20 0 points  Months in reverse 0 points  Repeat phrase 0 points  Total Score 0    Immunization History  Administered Date(s) Administered  . Influenza, High Dose Seasonal PF 08/18/2015, 08/10/2016  . Pneumococcal Conjugate-13 11/27/2014  . Pneumococcal Polysaccharide-23 12/22/2011  . Tdap 12/22/2011  . Zoster 11/26/2011   Screening Tests Health Maintenance  Topic Date Due  . TETANUS/TDAP  12/21/2021  . COLONOSCOPY  11/13/2022  . INFLUENZA VACCINE  Completed  . ZOSTAVAX  Completed  . Hepatitis C Screening  Completed  . PNA vac Low Risk Adult  Completed      Plan:  I have personally reviewed and addressed the Medicare Annual Wellness questionnaire and have noted the following in the patient's chart:  A. Medical and social history B. Use of alcohol, tobacco or illicit drugs  C. Current medications and supplements D. Functional ability and status E.  Nutritional status F.  Physical activity G. Advance directives H. List of other physicians I.  Hospitalizations, surgeries, and ER visits in previous 12 months J.  [place] such as hearing and vision if needed, cognitive and depression L. Referrals  and appointments - none  In addition, I have reviewed and discussed with patient certain preventive protocols, quality metrics, and best practice recommendations. A written personalized care plan for preventive services as well as general preventive health recommendations were provided to patient.  See attached scanned questionnaire for additional information.   Signed,  Fabio Neighbors, LPN Nurse Health Advisor   MD Recommendations:None  I have reviewed the health advisor's note, was available for consultation, and agree with documentation and plan  Lelon Huh, MD

## 2016-12-17 NOTE — Patient Instructions (Signed)
   Recommend taking 81mg enteric coated aspirin to reduce risk of vascular events such as heart attacks and strokes.    

## 2016-12-17 NOTE — Patient Instructions (Signed)

## 2016-12-17 NOTE — Progress Notes (Addendum)
Patient: Brett Bender Male    DOB: 1946/05/12   71 y.o.   MRN: [id number] Visit Date: 12/17/2016  Today's Provider: Lelon Huh, MD   Chief Complaint  Patient presents with  . Hypertension    follow up  . Depression    follow up  . Insomnia    follow up   Subjective:    HPI  Hypertension, follow-up:  BP Readings from Last 3 Encounters:  12/17/16 130/83  08/10/16 134/80  04/14/16 118/78    He was last seen for hypertension 8 months ago.  BP at that visit was 118/78. Management since that visit includes no changes. He reports good compliance with treatment. He is not having side effects.  He is exercising. He is adherent to low salt diet.   Outside blood pressures are not being checked. He is experiencing none.  Patient denies chest pain, chest pressure/discomfort, claudication, dyspnea, exertional chest pressure/discomfort, fatigue, irregular heart beat, lower extremity edema, near-syncope, orthopnea, palpitations, paroxysmal nocturnal dyspnea, syncope and tachypnea.   Cardiovascular risk factors include advanced age (older than 47 for men, 15 for women) and male gender.  Use of agents associated with hypertension: none.     Weight trend: fluctuating a bit Wt Readings from Last 3 Encounters:  12/17/16 237 lb 3.2 oz (107.6 kg)  08/10/16 234 lb (106.1 kg)  04/14/16 225 lb (102.1 kg)    Current diet: in general, a "healthy" diet    ------------------------------------------------------------------------ Follow up Depression:  Patient was last seen for this problem 8 months ago and no changes were made. Patient was advised to continue routine follow up with Psychiatry. Patient reports good compliance with treatment, good tolerance and good symptom control.    Follow up Insomnia:  Patient was last seen for this problem 9 months ago and no changes were made.Patient reports good compliance with treatment, good tolerance and good symptom control.        Allergies  Allergen Reactions  . Penicillins     Other reaction(s): Stomach Ache     Current Outpatient Prescriptions:  .  ALPRAZolam (XANAX) 0.5 MG tablet, 1 tablet at night at needed for insomnia, Disp: 30 tablet, Rfl: 0 .  amLODipine (NORVASC) 2.5 MG tablet, One every evening, Disp: 30 tablet, Rfl: 6 .  atenolol-chlorthalidone (TENORETIC) 50-25 MG tablet, TAKE 1/2 TABLET BY MOUTH EVERY DAY, Disp: 45 tablet, Rfl: 3 .  butalbital-acetaminophen-caffeine (FIORICET, ESGIC) 50-325-40 MG tablet, Take 1-2 tablets by mouth every 6 (six) hours as needed for headache., Disp: 20 tablet, Rfl: 3 .  cyclobenzaprine (FLEXERIL) 5 MG tablet, Take 1 tablet by mouth 2 (two) times daily as needed. Reported on 02/11/2016, Disp: , Rfl:  .  diclofenac (CATAFLAM) 50 MG tablet, TAKE 1 TABLET BY MOUTH TWICE A DAY, Disp: 180 tablet, Rfl: 4 .  estrogens, conjugated, (PREMARIN) 0.3 MG tablet, Take 1 tablet (0.3 mg total) by mouth daily. Take daily for 21 days then do not take for 7 days., Disp: 1 tablet, Rfl: 1 .  finasteride (PROSCAR) 5 MG tablet, TAKE 1 TABLET BY MOUTH ONCE A DAY, Disp: 90 tablet, Rfl: 0 .  fluticasone (FLONASE) 50 MCG/ACT nasal spray, Place 2 sprays into both nostrils daily. (Patient taking differently: Place 2 sprays into both nostrils daily. ), Disp: 16 g, Rfl: 6 .  HYDROcodone-acetaminophen (NORCO/VICODIN) 5-325 MG tablet, Reported on 03/16/2016, Disp: , Rfl: 0 .  mirtazapine (REMERON) 30 MG tablet, Take 1 tablet by mouth daily., Disp: , Rfl:  .  nortriptyline (PAMELOR) 25 MG capsule, Reported on 03/16/2016, Disp: , Rfl: 3 .  tamsulosin (FLOMAX) 0.4 MG CAPS capsule, Take 1 capsule (0.4 mg total) by mouth daily., Disp: 90 capsule, Rfl: 3 .  valACYclovir (VALTREX) 500 MG tablet, TAKE 1 TABLET EVERY 12 HOURS AS NEEDED, Disp: 20 tablet, Rfl: 3  Review of Systems  Constitutional: Negative for appetite change, chills and fever.  Respiratory: Negative for chest tightness, shortness of breath and  wheezing.   Cardiovascular: Negative for chest pain and palpitations.  Gastrointestinal: Negative for abdominal pain, nausea and vomiting.  Musculoskeletal: Positive for neck pain.    Social History  Substance Use Topics  . Smoking status: Never Smoker  . Smokeless tobacco: Never Used  . Alcohol use No   Objective:    Vital Signs - Last Recorded  Most recent update: 12/17/2016 2:03 PM by Fabio Neighbors, LPN  BP  [id number] (BP Location: Right Arm)     Pulse  68     Temp  98.6 F (37 C) (Oral)     Ht  6\' 3"  (1.905 m)     Wt  237 lb 3.2 oz (107.6 kg)      BMI  29.65 kg/m       Physical Exam   General Appearance:    Alert, cooperative, no distress  Eyes:    PERRL, conjunctiva/corneas clear, EOM's intact       Lungs:     Clear to auscultation bilaterally, respirations unlabored  Heart:    Regular rate and rhythm  Neurologic:   Awake, alert, oriented x 3. No apparent focal neurological           defect.           Assessment & Plan:     1. Essential (primary) hypertension Well controlled.  Continue current medications.   - Renal function panel - Lipid panel - EKG 12-Lead  2. Prostate cancer screening  - PSA  3. Insomina. Doing well with prn alprazolam        Lelon Huh, MD  [place]

## 2016-12-20 DIAGNOSIS — I10 Essential (primary) hypertension: Secondary | ICD-10-CM | POA: Diagnosis not present

## 2016-12-20 DIAGNOSIS — Z125 Encounter for screening for malignant neoplasm of prostate: Secondary | ICD-10-CM | POA: Diagnosis not present

## 2016-12-21 LAB — RENAL FUNCTION PANEL
ALBUMIN: 4.5 g/dL (ref 3.5–4.8)
BUN/Creatinine Ratio: 18 (ref 10–24)
BUN: 22 mg/dL (ref 8–27)
CALCIUM: 9.7 mg/dL (ref 8.6–10.2)
CHLORIDE: 99 mmol/L (ref 96–106)
CO2: 26 mmol/L (ref 18–29)
Creatinine, Ser: 1.21 mg/dL (ref 0.76–1.27)
GFR calc Af Amer: 70 mL/min/{1.73_m2} (ref >59)
GFR calc non Af Amer: 60 mL/min/{1.73_m2} (ref >59)
GLUCOSE: 99 mg/dL (ref 65–99)
PHOSPHORUS: 3 mg/dL (ref 2.5–4.5)
POTASSIUM: 4.5 mmol/L (ref 3.5–5.2)
Sodium: 139 mmol/L (ref 134–144)

## 2016-12-21 LAB — LIPID PANEL
CHOLESTEROL TOTAL: 176 mg/dL (ref 100–199)
Chol/HDL Ratio: 3.6 ratio units (ref 0.0–5.0)
HDL: 49 mg/dL (ref >39)
LDL Calculated: 99 mg/dL (ref 0–99)
TRIGLYCERIDES: 141 mg/dL (ref 0–149)
VLDL CHOLESTEROL CAL: 28 mg/dL (ref 5–40)

## 2016-12-21 LAB — PSA

## 2016-12-22 ENCOUNTER — Telehealth: Payer: Self-pay

## 2016-12-22 NOTE — Telephone Encounter (Signed)
-----   Message from Birdie Sons, MD sent at 12/21/2016  1:10 PM EST ----- Blood sugar, kidney functions, electrolytes and cholesterol are all normal. Check labs yearly.

## 2016-12-22 NOTE — Telephone Encounter (Signed)
Advised patient of results.  

## 2016-12-22 NOTE — Telephone Encounter (Signed)
Left message to call back  

## 2017-01-13 ENCOUNTER — Other Ambulatory Visit: Payer: Self-pay | Admitting: Family Medicine

## 2017-01-13 DIAGNOSIS — F32A Depression, unspecified: Secondary | ICD-10-CM

## 2017-01-13 DIAGNOSIS — I10 Essential (primary) hypertension: Secondary | ICD-10-CM

## 2017-01-13 DIAGNOSIS — F329 Major depressive disorder, single episode, unspecified: Secondary | ICD-10-CM

## 2017-01-17 DIAGNOSIS — M76822 Posterior tibial tendinitis, left leg: Secondary | ICD-10-CM | POA: Diagnosis not present

## 2017-01-17 DIAGNOSIS — B351 Tinea unguium: Secondary | ICD-10-CM | POA: Diagnosis not present

## 2017-01-26 ENCOUNTER — Other Ambulatory Visit: Payer: Self-pay | Admitting: Family Medicine

## 2017-01-26 DIAGNOSIS — D229 Melanocytic nevi, unspecified: Secondary | ICD-10-CM | POA: Diagnosis not present

## 2017-01-26 DIAGNOSIS — L820 Inflamed seborrheic keratosis: Secondary | ICD-10-CM | POA: Diagnosis not present

## 2017-01-26 DIAGNOSIS — L570 Actinic keratosis: Secondary | ICD-10-CM | POA: Diagnosis not present

## 2017-01-26 DIAGNOSIS — L812 Freckles: Secondary | ICD-10-CM | POA: Diagnosis not present

## 2017-01-26 DIAGNOSIS — Z85828 Personal history of other malignant neoplasm of skin: Secondary | ICD-10-CM | POA: Diagnosis not present

## 2017-01-26 DIAGNOSIS — L578 Other skin changes due to chronic exposure to nonionizing radiation: Secondary | ICD-10-CM | POA: Diagnosis not present

## 2017-01-26 DIAGNOSIS — L821 Other seborrheic keratosis: Secondary | ICD-10-CM | POA: Diagnosis not present

## 2017-01-26 DIAGNOSIS — Z1283 Encounter for screening for malignant neoplasm of skin: Secondary | ICD-10-CM | POA: Diagnosis not present

## 2017-02-02 ENCOUNTER — Other Ambulatory Visit: Payer: Self-pay | Admitting: Urology

## 2017-02-02 DIAGNOSIS — N401 Enlarged prostate with lower urinary tract symptoms: Principal | ICD-10-CM

## 2017-02-02 DIAGNOSIS — N138 Other obstructive and reflux uropathy: Secondary | ICD-10-CM

## 2017-02-22 DIAGNOSIS — F329 Major depressive disorder, single episode, unspecified: Secondary | ICD-10-CM | POA: Diagnosis not present

## 2017-02-22 DIAGNOSIS — M4316 Spondylolisthesis, lumbar region: Secondary | ICD-10-CM | POA: Diagnosis not present

## 2017-02-22 DIAGNOSIS — M47816 Spondylosis without myelopathy or radiculopathy, lumbar region: Secondary | ICD-10-CM | POA: Diagnosis not present

## 2017-02-23 ENCOUNTER — Ambulatory Visit
Admission: RE | Admit: 2017-02-23 | Discharge: 2017-02-23 | Disposition: A | Discharge status: Home / Self Care | Payer: Medicare Other | Source: Ambulatory Visit | Attending: Urology | Admitting: Urology

## 2017-02-23 ENCOUNTER — Other Ambulatory Visit: Payer: Self-pay | Admitting: *Deleted

## 2017-02-23 DIAGNOSIS — N200 Calculus of kidney: Secondary | ICD-10-CM

## 2017-02-23 DIAGNOSIS — R109 Unspecified abdominal pain: Secondary | ICD-10-CM | POA: Insufficient documentation

## 2017-02-23 DIAGNOSIS — R195 Other fecal abnormalities: Secondary | ICD-10-CM | POA: Insufficient documentation

## 2017-02-25 ENCOUNTER — Other Ambulatory Visit: Payer: Self-pay | Admitting: *Deleted

## 2017-02-25 ENCOUNTER — Other Ambulatory Visit: Payer: Self-pay

## 2017-02-25 ENCOUNTER — Other Ambulatory Visit: Payer: Medicare Other

## 2017-02-25 DIAGNOSIS — M5416 Radiculopathy, lumbar region: Secondary | ICD-10-CM | POA: Diagnosis not present

## 2017-02-25 DIAGNOSIS — R350 Frequency of micturition: Secondary | ICD-10-CM

## 2017-02-25 DIAGNOSIS — N200 Calculus of kidney: Secondary | ICD-10-CM

## 2017-02-25 DIAGNOSIS — M545 Low back pain: Secondary | ICD-10-CM | POA: Diagnosis not present

## 2017-02-25 MED ORDER — VALACYCLOVIR HCL 500 MG PO TABS: 500.0000 mg | ORAL_TABLET | Freq: Two times a day (BID) | ORAL | 3 refills | Status: DC | PRN

## 2017-02-28 NOTE — Progress Notes (Signed)
10:09 AM   OSIE AMPARO October 07, 1946 [id number]  Referring provider: Birdie Sons, MD [address] [place], [place] [address]  Chief Complaint  Patient presents with  . Urinary Frequency    1 year follow up   . Nephrolithiasis    HPI: Patient is a 71 year old WM who presents today for his one year follow up for nephrolithiasis, frequency and nocturia.    Nephrolithiasis Patient underwent left URS/LL/left ureteral stent placement with subsequent stent removal for a left distal (64mm) stone in 12/2015.  Follow up RUS confirmed resolution of the left hydronephrosis.  Bilateral nephrolithiasis is still present.  Stone composition was calcium oxalate dihydrate 30%, calcium oxalate monohydrate 60% and calcium phosphate 10%.  Litholink results noted he was at increased risk for stone formation due to borderline hyperoxaluria, hypocitraturia, high urine pH and mild hyperurlcosuria.  He was given instruction on dietary changes and encouraged to drink 2.5 L of water daily.  KUB taken on 02/15/2017 noted no nephrolithiasis.   I have independently reviewed the films.  Today, he has been experiencing left back pain.  He states that he reached to give someone a hug and felt a tugging sensation.  He has seen chiropractor and orthopedics.  He states the pain is not relenting.  He is also experiencing numbness down the top of his left thigh.    Frequency He is currently taking tamsulosin and finasteride.  His previous IPSS score was 20/3 and previous PVR was 25 mL.   He does admit to not drinking enough water.  Myrbetriq 25 mg daily was not effective in controlling his symptoms.  His current IPSS score is 22/4.  His PVR is 0 mL.  Recent cystoscopy in 12/2015 did not reveal any worrisome findings.    Nocturia He also stated at his last visit that  he's noticed increase from nocturia getting up one time a night to sometimes 3-4.  Not undergone a sleep study, but he states that a few years  ago on went on a camping trip with some of his friends and they stated he had stopped breathing during the night.  He is a snorer.  He has had upcoming appointment with his psychiatrist and they want to do a sleep study. He is not experiencing dysuria, gross hematuria or suprapubic pain.  He is not experiencing urgency or flank pain.       IPSS    Row Name 03/01/17 0900         International Prostate Symptom Score   How often have you had the sensation of not emptying your bladder? More than half the time     How often have you had to urinate less than every two hours? More than half the time     How often have you found you stopped and started again several times when you urinated? More than half the time     How often have you found it difficult to postpone urination? Less than 1 in 5 times     How often have you had a weak urinary stream? More than half the time     How often have you had to strain to start urination? About half the time     How many times did you typically get up at night to urinate? 2 Times     Total IPSS Score 22       Quality of Life due to urinary symptoms   If  you were to spend the rest of your life with your urinary condition just the way it is now how would you feel about that? Mostly Disatisfied        Score:  1-7 Mild 8-19 Moderate 20-35 Severe   PMH: Past Medical History:  Diagnosis Date  . Arthritis   . Cancer (HCC)    Basal cell carcinoma  . Complication of anesthesia    slow to wake up  . Enlarged prostate   . Herpes simplex   . History of MRSA infection   . Hypertension   . Kidney stone   . Tendonitis of ankle, left     Surgical History: Past Surgical History:  Procedure Laterality Date  . APPENDECTOMY    . CYSTOSCOPY/URETEROSCOPY/HOLMIUM LASER/STENT PLACEMENT Left 12/18/2015   Procedure: CYSTOSCOPY/URETEROSCOPY/HOLMIUM LASER LITHOTRIPSY;  Surgeon: Hollice Espy, MD;  Location: [facility] ORS;  Service: Urology;  Laterality: Left;  .  LOWER BACK SURGERY    . NECK SURGERY     Upper neck surgery  . SHOULDER SURGERY Right 11/10/2011   arthroscopic surgery . Dr. Tamala Julian, [facility]  . TONSILLECTOMY      Home Medications:  Allergies as of 03/01/2017      Reactions   Penicillins    Other reaction(s): Stomach Ache      Medication List       Accurate as of 03/01/17 10:09 AM. Always use your most recent med list.          ALPRAZolam 0.5 MG tablet Commonly known as:  XANAX 1 tablet at night at needed for insomnia   amLODipine 2.5 MG tablet Commonly known as:  NORVASC TAKE 1 TABLET BY MOUTH ONCE EVERY EVENING   atenolol-chlorthalidone 50-25 MG tablet Commonly known as:  TENORETIC TAKE 1/2 TABLET BY MOUTH DAILY   butalbital-acetaminophen-caffeine 50-325-40 MG tablet Commonly known as:  FIORICET, ESGIC Take 1-2 tablets by mouth every 6 (six) hours as needed for headache.   cyclobenzaprine 5 MG tablet Commonly known as:  FLEXERIL Take 1 tablet by mouth 2 (two) times daily as needed. Reported on 02/11/2016   diclofenac 50 MG tablet Commonly known as:  CATAFLAM TAKE 1 TABLET BY MOUTH TWICE A DAY   estrogens (conjugated) 0.3 MG tablet Commonly known as:  PREMARIN Take 1 tablet (0.3 mg total) by mouth daily. Take daily for 21 days then do not take for 7 days.   finasteride 5 MG tablet Commonly known as:  PROSCAR TAKE 1 TABLET BY MOUTH ONCE A DAY   fluticasone 50 MCG/ACT nasal spray Commonly known as:  FLONASE Place 2 sprays into both nostrils daily.   HYDROcodone-acetaminophen 5-325 MG tablet Commonly known as:  NORCO/VICODIN Reported on 03/16/2016   mirtazapine 30 MG tablet Commonly known as:  REMERON Take 1 tablet by mouth daily.   nortriptyline 25 MG capsule Commonly known as:  PAMELOR Reported on 03/16/2016   tamsulosin 0.4 MG Caps capsule Commonly known as:  FLOMAX TAKE 1 CAPSULE BY MOUTH ONCE DAILY   terbinafine 250 MG tablet Commonly known as:  LAMISIL Take 1 tablet by mouth daily.     valACYclovir 500 MG tablet Commonly known as:  VALTREX Take 1 tablet (500 mg total) by mouth 2 (two) times daily as needed.   zolpidem 5 MG tablet Commonly known as:  AMBIEN       Allergies:  Allergies  Allergen Reactions  . Penicillins     Other reaction(s): Stomach Ache    Family History: Family History  Problem Relation Age  of Onset  . Diabetes Neg Hx   . Cancer Neg Hx   . Heart attack Neg Hx   . Kidney disease Neg Hx   . Prostate cancer Neg Hx   . Kidney cancer Neg Hx   . Bladder Cancer Neg Hx     Social History:  reports that he has never smoked. He has never used smokeless tobacco. He reports that he does not drink alcohol or use drugs.  ROS: UROLOGY Frequent Urination?: Yes Hard to postpone urination?: No Burning/pain with urination?: Yes Get up at night to urinate?: Yes Leakage of urine?: No Urine stream starts and stops?: Yes Trouble starting stream?: Yes Do you have to strain to urinate?: Yes Blood in urine?: No Urinary tract infection?: No Sexually transmitted disease?: No Injury to kidneys or bladder?: No Painful intercourse?: No Weak stream?: Yes Erection problems?: No Penile pain?: No  Gastrointestinal Nausea?: No Vomiting?: No Indigestion/heartburn?: No Diarrhea?: No Constipation?: No  Constitutional Fever: No Night sweats?: No Weight loss?: No Fatigue?: No  Skin Skin rash/lesions?: No Itching?: No  Eyes Blurred vision?: No Double vision?: No  Ears/Nose/Throat Sore throat?: No Sinus problems?: No  Hematologic/Lymphatic Swollen glands?: No Easy bruising?: No  Cardiovascular Leg swelling?: No Chest pain?: No  Respiratory Cough?: No Shortness of breath?: No  Endocrine Excessive thirst?: No  Musculoskeletal Back pain?: Yes Joint pain?: No  Neurological Headaches?: No Dizziness?: No  Psychologic Depression?: No Anxiety?: No  Physical Exam: BP 125/84   Pulse (!) 56   Ht 6\' 4"  (1.93 m)   Wt 226 lb 8 oz  (102.7 kg)   BMI 27.57 kg/m   Constitutional: Well nourished. Alert and oriented, No acute distress. HEENT: [place] AT, moist mucus membranes. Trachea midline, no masses. Cardiovascular: No clubbing, cyanosis, or edema. Respiratory: Normal respiratory effort, no increased work of breathing. GI: Abdomen is soft, non tender, non distended, no abdominal masses. Liver and spleen not palpable.  No hernias appreciated.  Stool sample for occult testing is not indicated.   GU: No CVA tenderness.  No bladder fullness or masses.  Patient with circumcised phallus.  Urethral meatus is patent.  No penile discharge. No penile lesions or rashes. Scrotum without lesions, cysts, rashes and/or edema.  Testicles are located scrotally bilaterally. No masses are appreciated in the testicles. Left and right epididymis are normal. Rectal: Patient with  normal sphincter tone. Anus and perineum without scarring or rashes. No rectal masses are appreciated. Prostate is approximately 45 grams, no nodules are appreciated. Seminal vesicles are normal. Skin: No rashes, bruises or suspicious lesions. Lymph: No cervical or inguinal adenopathy. Neurologic: Grossly intact, no focal deficits, moving all 4 extremities. Psychiatric: Normal mood and affect.  Laboratory Data: Lab Results  Component Value Date   WBC 8.1 12/17/2015   HGB 15.0 12/17/2015   HCT 45.1 12/17/2015   MCV 94.6 12/17/2015   PLT 213 12/17/2015    Lab Results  Component Value Date   CREATININE 1.21 12/20/2016    Lab Results  Component Value Date   PSA 0.1 11/28/2014   PSA History  <0.1 ng/mL on 11/24/2015   Lab Results  Component Value Date   TSH 1.02 02/27/2012       Component Value Date/Time   CHOL 176 12/20/2016 0000   HDL 49 12/20/2016 0000   CHOLHDL 3.6 12/20/2016 0000   LDLCALC 99 12/20/2016 0000    Lab Results  Component Value Date   AST 24 02/27/2012   Lab Results  Component Value  Date   ALT 31 02/27/2012    Pertinent  Imaging: Results for CHARAN, PRIETO (MRN [id number]) as of 03/01/2017 09:54  Ref. Range 03/01/2017 09:48  Scan Result Unknown 0    CLINICAL DATA:  Yearly follow-up of kidney stones. No current symptoms. Last lithotripsy was February 27 teen  EXAM: ABDOMEN - 1 VIEW  COMPARISON:  KUB of February 11, 2016  FINDINGS: No abnormal calcifications project over either kidney nor along the course of the ureters nor over the urinary bladder. The colonic stool burden is moderately increased. The bony structures exhibit no acute abnormalities.  IMPRESSION: No calcified urinary tract stones are observed. Increased colonic stool burden may reflect constipation in the appropriate clinical setting.   Electronically Signed   By: David  Martinique M.D.   On: 02/23/2017 09:08  Assessment & Plan:    1. Urinary frequency  - I PSS score is 22/4, it is worsening  - PVR is 0 mL  - cystoscopy in 12/2015 was negative  - continue the tamsulosin 0.4 mg daily and the finasteride 5 mg daily  - increase water intake  - RTC in one year for IPSS score, exam, PSA, KUB and UA.    2. Nocturia  - patient still has not undergone a sleep study as of this visit  - encouraged patient to undergo a sleep study    3. History of nephrolithiasis  - KUB from 02/23/2017 noted no stones  - current back pain most likely MSK in nature, encouraged patient to follow up with orthopedics  - RTC in one year for KUB and office visit  - will contact us in the interim if he should experience hematuria or flank pain.      Return in about 1 year (around 03/01/2018) for IPSS, PSA, KUB and exam.  These notes generated with voice recognition software. I apologize for typographical errors.  Zara Council, [facility] [address] [place], [place] [address] [phone]

## 2017-03-01 ENCOUNTER — Ambulatory Visit (INDEPENDENT_AMBULATORY_CARE_PROVIDER_SITE_OTHER): Payer: Medicare Other | Admitting: Urology

## 2017-03-01 ENCOUNTER — Encounter: Payer: Self-pay | Admitting: Urology

## 2017-03-01 VITALS — BP 125/84 | HR 56 | Ht 76.0 in | Wt 226.5 lb

## 2017-03-01 DIAGNOSIS — Z87442 Personal history of urinary calculi: Secondary | ICD-10-CM

## 2017-03-01 DIAGNOSIS — R350 Frequency of micturition: Secondary | ICD-10-CM | POA: Diagnosis not present

## 2017-03-01 DIAGNOSIS — R351 Nocturia: Secondary | ICD-10-CM | POA: Diagnosis not present

## 2017-03-01 LAB — BLADDER SCAN AMB NON-IMAGING: Scan Result: 0

## 2017-03-02 ENCOUNTER — Telehealth: Payer: Self-pay

## 2017-03-02 LAB — PSA: Prostate Specific Ag, Serum: <0.1 ng/mL (ref 0.0–4.0)

## 2017-03-02 NOTE — Telephone Encounter (Signed)
Spoke with pt in reference to PSA results and f/u in 1 year. Pt voiced understanding.

## 2017-03-02 NOTE — Telephone Encounter (Signed)
-----   Message from Nori Riis, PA-C sent at 03/02/2017  8:01 AM EDT ----- Patient's PSA is stable at <0.1.   We will see him in 12 months.  PSA to be drawn before his next appointment.

## 2017-03-03 ENCOUNTER — Other Ambulatory Visit: Payer: Self-pay | Admitting: Orthopedic Surgery

## 2017-03-03 DIAGNOSIS — M545 Low back pain: Secondary | ICD-10-CM

## 2017-03-14 ENCOUNTER — Other Ambulatory Visit: Payer: Self-pay | Admitting: Urology

## 2017-03-14 DIAGNOSIS — N401 Enlarged prostate with lower urinary tract symptoms: Principal | ICD-10-CM

## 2017-03-14 DIAGNOSIS — N138 Other obstructive and reflux uropathy: Secondary | ICD-10-CM

## 2017-03-15 ENCOUNTER — Ambulatory Visit
Admission: RE | Admit: 2017-03-15 | Discharge: 2017-03-15 | Disposition: A | Discharge status: Home / Self Care | Payer: Medicare Other | Source: Ambulatory Visit | Attending: Orthopedic Surgery | Admitting: Orthopedic Surgery

## 2017-03-15 DIAGNOSIS — M545 Low back pain: Secondary | ICD-10-CM | POA: Diagnosis not present

## 2017-03-15 DIAGNOSIS — M5126 Other intervertebral disc displacement, lumbar region: Secondary | ICD-10-CM | POA: Insufficient documentation

## 2017-03-15 DIAGNOSIS — M48061 Spinal stenosis, lumbar region without neurogenic claudication: Secondary | ICD-10-CM | POA: Insufficient documentation

## 2017-03-16 ENCOUNTER — Other Ambulatory Visit: Payer: Self-pay | Admitting: Orthopedic Surgery

## 2017-03-16 ENCOUNTER — Ambulatory Visit
Admission: RE | Admit: 2017-03-16 | Discharge: 2017-03-16 | Disposition: A | Discharge status: Home / Self Care | Payer: Medicare Other | Source: Ambulatory Visit | Attending: Orthopedic Surgery | Admitting: Orthopedic Surgery

## 2017-03-16 DIAGNOSIS — M5126 Other intervertebral disc displacement, lumbar region: Secondary | ICD-10-CM

## 2017-03-16 DIAGNOSIS — M545 Low back pain: Secondary | ICD-10-CM | POA: Diagnosis not present

## 2017-03-16 LAB — POCT I-STAT CREATININE: Creatinine, Ser: 1.2 mg/dL (ref 0.61–1.24)

## 2017-03-16 MED ORDER — GADOBENATE DIMEGLUMINE 529 MG/ML IV SOLN
20.0000 mL | Freq: Once | INTRAVENOUS | Status: AC | PRN
  Administered 2017-03-16: 20 mL via INTRAVENOUS

## 2017-03-18 DIAGNOSIS — M5416 Radiculopathy, lumbar region: Secondary | ICD-10-CM | POA: Diagnosis not present

## 2017-03-21 ENCOUNTER — Other Ambulatory Visit: Payer: Self-pay | Admitting: Orthopedic Surgery

## 2017-03-21 ENCOUNTER — Ambulatory Visit
Admission: RE | Admit: 2017-03-21 | Discharge: 2017-03-21 | Disposition: A | Discharge status: Home / Self Care | Payer: Medicare Other | Source: Ambulatory Visit | Attending: Orthopedic Surgery | Admitting: Orthopedic Surgery

## 2017-03-21 DIAGNOSIS — M5416 Radiculopathy, lumbar region: Secondary | ICD-10-CM

## 2017-03-21 DIAGNOSIS — N200 Calculus of kidney: Secondary | ICD-10-CM | POA: Diagnosis not present

## 2017-03-21 DIAGNOSIS — I700 Atherosclerosis of aorta: Secondary | ICD-10-CM | POA: Insufficient documentation

## 2017-03-21 DIAGNOSIS — D3502 Benign neoplasm of left adrenal gland: Secondary | ICD-10-CM | POA: Diagnosis not present

## 2017-03-21 DIAGNOSIS — M5116 Intervertebral disc disorders with radiculopathy, lumbar region: Secondary | ICD-10-CM | POA: Insufficient documentation

## 2017-03-21 DIAGNOSIS — M2578 Osteophyte, vertebrae: Secondary | ICD-10-CM | POA: Diagnosis not present

## 2017-03-21 DIAGNOSIS — M48061 Spinal stenosis, lumbar region without neurogenic claudication: Secondary | ICD-10-CM | POA: Diagnosis not present

## 2017-03-31 DIAGNOSIS — M5416 Radiculopathy, lumbar region: Secondary | ICD-10-CM | POA: Diagnosis not present

## 2017-04-05 DIAGNOSIS — M48062 Spinal stenosis, lumbar region with neurogenic claudication: Secondary | ICD-10-CM | POA: Diagnosis not present

## 2017-04-05 DIAGNOSIS — M545 Low back pain: Secondary | ICD-10-CM | POA: Diagnosis not present

## 2017-04-18 DIAGNOSIS — S86219A Strain of muscle(s) and tendon(s) of anterior muscle group at lower leg level, unspecified leg, initial encounter: Secondary | ICD-10-CM | POA: Diagnosis not present

## 2017-04-21 DIAGNOSIS — M545 Low back pain: Secondary | ICD-10-CM | POA: Diagnosis not present

## 2017-05-16 DIAGNOSIS — L578 Other skin changes due to chronic exposure to nonionizing radiation: Secondary | ICD-10-CM | POA: Diagnosis not present

## 2017-05-16 DIAGNOSIS — L570 Actinic keratosis: Secondary | ICD-10-CM | POA: Diagnosis not present

## 2017-05-17 DIAGNOSIS — F329 Major depressive disorder, single episode, unspecified: Secondary | ICD-10-CM | POA: Diagnosis not present

## 2017-05-24 ENCOUNTER — Telehealth: Payer: Self-pay | Admitting: Family Medicine

## 2017-05-24 NOTE — Telephone Encounter (Signed)
Pt called about the new shingles vaccine.  He says his insurance says it will be covered if it was filed as medical.  He states he can get it from the pharmacy but wants to know if we can give it to him otherwise he will have to pay a 60.00 copay  His call back is [phone]   Thanks Con Memos

## 2017-05-25 ENCOUNTER — Ambulatory Visit (INDEPENDENT_AMBULATORY_CARE_PROVIDER_SITE_OTHER): Payer: Medicare Other | Admitting: Family Medicine

## 2017-05-25 DIAGNOSIS — Z23 Encounter for immunization: Secondary | ICD-10-CM | POA: Diagnosis not present

## 2017-05-25 NOTE — Telephone Encounter (Addendum)
Noted. Patient is scheduled for nurse visit today.

## 2017-05-25 NOTE — Telephone Encounter (Signed)
Pt called back asking about new shingles shot. I advised pt that I wasn't aware of how it was filled and he would need to contact his insurance. Pt stated he had spoke with insurance and he wanted to go ahead and get shot at our office. I verified we had the vaccine in the office and pt is scheduled for 10:45 this morning. Thanks TNP

## 2017-05-27 NOTE — Progress Notes (Signed)
Vaccine only, no MD visit.  

## 2017-07-28 ENCOUNTER — Ambulatory Visit (INDEPENDENT_AMBULATORY_CARE_PROVIDER_SITE_OTHER): Payer: Medicare Other | Admitting: Family Medicine

## 2017-07-28 DIAGNOSIS — Z23 Encounter for immunization: Secondary | ICD-10-CM

## 2017-07-29 NOTE — Progress Notes (Signed)
Vaccine only, no md visit.

## 2017-08-16 DIAGNOSIS — H2513 Age-related nuclear cataract, bilateral: Secondary | ICD-10-CM | POA: Diagnosis not present

## 2017-08-16 DIAGNOSIS — H40003 Preglaucoma, unspecified, bilateral: Secondary | ICD-10-CM | POA: Diagnosis not present

## 2017-08-16 DIAGNOSIS — H35033 Hypertensive retinopathy, bilateral: Secondary | ICD-10-CM | POA: Diagnosis not present

## 2017-09-20 DIAGNOSIS — F329 Major depressive disorder, single episode, unspecified: Secondary | ICD-10-CM | POA: Diagnosis not present

## 2017-11-03 ENCOUNTER — Other Ambulatory Visit: Payer: Self-pay | Admitting: Urology

## 2017-11-03 DIAGNOSIS — N138 Other obstructive and reflux uropathy: Secondary | ICD-10-CM

## 2017-11-03 DIAGNOSIS — N401 Enlarged prostate with lower urinary tract symptoms: Principal | ICD-10-CM

## 2017-11-17 DIAGNOSIS — L821 Other seborrheic keratosis: Secondary | ICD-10-CM | POA: Diagnosis not present

## 2017-11-17 DIAGNOSIS — L578 Other skin changes due to chronic exposure to nonionizing radiation: Secondary | ICD-10-CM | POA: Diagnosis not present

## 2017-11-17 DIAGNOSIS — L820 Inflamed seborrheic keratosis: Secondary | ICD-10-CM | POA: Diagnosis not present

## 2017-11-17 DIAGNOSIS — L570 Actinic keratosis: Secondary | ICD-10-CM | POA: Diagnosis not present

## 2017-11-17 DIAGNOSIS — Z85828 Personal history of other malignant neoplasm of skin: Secondary | ICD-10-CM | POA: Diagnosis not present

## 2017-11-17 DIAGNOSIS — Z1283 Encounter for screening for malignant neoplasm of skin: Secondary | ICD-10-CM | POA: Diagnosis not present

## 2017-11-17 DIAGNOSIS — L812 Freckles: Secondary | ICD-10-CM | POA: Diagnosis not present

## 2017-12-13 ENCOUNTER — Other Ambulatory Visit: Payer: Self-pay | Admitting: Family Medicine

## 2017-12-13 NOTE — Telephone Encounter (Signed)
Pharmacy requesting refills. Thanks!  

## 2017-12-15 DIAGNOSIS — L570 Actinic keratosis: Secondary | ICD-10-CM | POA: Diagnosis not present

## 2017-12-16 ENCOUNTER — Other Ambulatory Visit: Payer: Self-pay | Admitting: Urology

## 2017-12-16 DIAGNOSIS — N401 Enlarged prostate with lower urinary tract symptoms: Principal | ICD-10-CM

## 2017-12-16 DIAGNOSIS — N138 Other obstructive and reflux uropathy: Secondary | ICD-10-CM

## 2017-12-20 ENCOUNTER — Ambulatory Visit (INDEPENDENT_AMBULATORY_CARE_PROVIDER_SITE_OTHER): Payer: Medicare Other

## 2017-12-20 ENCOUNTER — Ambulatory Visit (INDEPENDENT_AMBULATORY_CARE_PROVIDER_SITE_OTHER): Payer: Medicare Other | Admitting: Family Medicine

## 2017-12-20 VITALS — BP 126/78 | HR 60 | Temp 98.0°F | Ht 76.0 in | Wt 229.0 lb

## 2017-12-20 DIAGNOSIS — G4700 Insomnia, unspecified: Secondary | ICD-10-CM

## 2017-12-20 DIAGNOSIS — R350 Frequency of micturition: Secondary | ICD-10-CM

## 2017-12-20 DIAGNOSIS — Z0000 Encounter for general adult medical examination without abnormal findings: Secondary | ICD-10-CM

## 2017-12-20 DIAGNOSIS — M25551 Pain in right hip: Secondary | ICD-10-CM | POA: Diagnosis not present

## 2017-12-20 DIAGNOSIS — F32A Depression, unspecified: Secondary | ICD-10-CM

## 2017-12-20 DIAGNOSIS — I10 Essential (primary) hypertension: Secondary | ICD-10-CM | POA: Diagnosis not present

## 2017-12-20 DIAGNOSIS — F329 Major depressive disorder, single episode, unspecified: Secondary | ICD-10-CM

## 2017-12-20 DIAGNOSIS — N401 Enlarged prostate with lower urinary tract symptoms: Secondary | ICD-10-CM | POA: Diagnosis not present

## 2017-12-20 MED ORDER — METHYLPREDNISOLONE ACETATE 80 MG/ML IJ SUSP: 80.0000 mg | Freq: Once | INTRAMUSCULAR | Status: DC

## 2017-12-20 NOTE — Patient Instructions (Signed)
Brett Bender , Thank you for taking time to come for your Medicare Wellness Visit. I appreciate your ongoing commitment to your health goals. Please review the following plan we discussed and let me know if I can assist you in the future.   Screening recommendations/referrals: Colonoscopy: Up to date Recommended yearly ophthalmology/optometry visit for glaucoma screening and checkup Recommended yearly dental visit for hygiene and checkup  Vaccinations: Influenza vaccine: Up to date Pneumococcal vaccine: Up to date Tdap vaccine: Up to date Shingles vaccine: Up to date    Advanced directives: Already on file.  Conditions/risks identified: Recommend to cut out extra servings after dinner. Advised if going to have a snack to make it healthy, no sweets.   Next appointment: 10:00 AM today  Preventive Care 72 Years and Older, Male Preventive care refers to lifestyle choices and visits with your health care provider that can promote health and wellness. What does preventive care include?  A yearly physical exam. This is also called an annual well check.  Dental exams once or twice a year.  Routine eye exams. Ask your health care provider how often you should have your eyes checked.  Personal lifestyle choices, including:  Daily care of your teeth and gums.  Regular physical activity.  Eating a healthy diet.  Avoiding tobacco and drug use.  Limiting alcohol use.  Practicing safe sex.  Taking low doses of aspirin every day.  Taking vitamin and mineral supplements as recommended by your health care provider. What happens during an annual well check? The services and screenings done by your health care provider during your annual well check will depend on your age, overall health, lifestyle risk factors, and family history of disease. Counseling  Your health care provider may ask you questions about your:  Alcohol use.  Tobacco use.  Drug use.  Emotional well-being.  Home  and relationship well-being.  Sexual activity.  Eating habits.  History of falls.  Memory and ability to understand (cognition).  Work and work Statistician. Screening  You may have the following tests or measurements:  Height, weight, and BMI.  Blood pressure.  Lipid and cholesterol levels. These may be checked every 5 years, or more frequently if you are over 72 years old.  Skin check.  Lung cancer screening. You may have this screening every year starting at age 72 if you have a 30-pack-year history of smoking and currently smoke or have quit within the past 15 years.  Fecal occult blood test (FOBT) of the stool. You may have this test every year starting at age 72.  Flexible sigmoidoscopy or colonoscopy. You may have a sigmoidoscopy every 5 years or a colonoscopy every 10 years starting at age 72.  Prostate cancer screening. Recommendations will vary depending on your family history and other risks.  Hepatitis C blood test.  Hepatitis B blood test.  Sexually transmitted disease (STD) testing.  Diabetes screening. This is done by checking your blood sugar (glucose) after you have not eaten for a while (fasting). You may have this done every 1-3 years.  Abdominal aortic aneurysm (AAA) screening. You may need this if you are a current or former smoker.  Osteoporosis. You may be screened starting at age 72 if you are at high risk. Talk with your health care provider about your test results, treatment options, and if necessary, the need for more tests. Vaccines  Your health care provider may recommend certain vaccines, such as:  Influenza vaccine. This is recommended every year.  Tetanus, diphtheria, and acellular pertussis (Tdap, Td) vaccine. You may need a Td booster every 10 years.  Zoster vaccine. You may need this after age 72.  Pneumococcal 13-valent conjugate (PCV13) vaccine. One dose is recommended after age 72.  Pneumococcal polysaccharide (PPSV23) vaccine.  One dose is recommended after age 72. Talk to your health care provider about which screenings and vaccines you need and how often you need them. This information is not intended to replace advice given to you by your health care provider. Make sure you discuss any questions you have with your health care provider. Document Released: 11/21/2015 Document Revised: 07/14/2016 Document Reviewed: 08/26/2015 Elsevier Interactive Patient Education  2017 [place] Prevention in the Home Falls can cause injuries. They can happen to people of all ages. There are many things you can do to make your home safe and to help prevent falls. What can I do on the outside of my home?  Regularly fix the edges of walkways and driveways and fix any cracks.  Remove anything that might make you trip as you walk through a door, such as a raised step or threshold.  Trim any bushes or trees on the path to your home.  Use bright outdoor lighting.  Clear any walking paths of anything that might make someone trip, such as rocks or tools.  Regularly check to see if handrails are loose or broken. Make sure that both sides of any steps have handrails.  Any raised decks and porches should have guardrails on the edges.  Have any leaves, snow, or ice cleared regularly.  Use sand or salt on walking paths during winter.  Clean up any spills in your garage right away. This includes oil or grease spills. What can I do in the bathroom?  Use night lights.  Install grab bars by the toilet and in the tub and shower. Do not use towel bars as grab bars.  Use non-skid mats or decals in the tub or shower.  If you need to sit down in the shower, use a plastic, non-slip stool.  Keep the floor dry. Clean up any water that spills on the floor as soon as it happens.  Remove soap buildup in the tub or shower regularly.  Attach bath mats securely with double-sided non-slip rug tape.  Do not have throw rugs and other  things on the floor that can make you trip. What can I do in the bedroom?  Use night lights.  Make sure that you have a light by your bed that is easy to reach.  Do not use any sheets or blankets that are too big for your bed. They should not hang down onto the floor.  Have a firm chair that has side arms. You can use this for support while you get dressed.  Do not have throw rugs and other things on the floor that can make you trip. What can I do in the kitchen?  Clean up any spills right away.  Avoid walking on wet floors.  Keep items that you use a lot in easy-to-reach places.  If you need to reach something above you, use a strong step stool that has a grab bar.  Keep electrical cords out of the way.  Do not use floor polish or wax that makes floors slippery. If you must use wax, use non-skid floor wax.  Do not have throw rugs and other things on the floor that can make you trip. What can I do  with my stairs?  Do not leave any items on the stairs.  Make sure that there are handrails on both sides of the stairs and use them. Fix handrails that are broken or loose. Make sure that handrails are as long as the stairways.  Check any carpeting to make sure that it is firmly attached to the stairs. Fix any carpet that is loose or worn.  Avoid having throw rugs at the top or bottom of the stairs. If you do have throw rugs, attach them to the floor with carpet tape.  Make sure that you have a light switch at the top of the stairs and the bottom of the stairs. If you do not have them, ask someone to add them for you. What else can I do to help prevent falls?  Wear shoes that:  Do not have high heels.  Have rubber bottoms.  Are comfortable and fit you well.  Are closed at the toe. Do not wear sandals.  If you use a stepladder:  Make sure that it is fully opened. Do not climb a closed stepladder.  Make sure that both sides of the stepladder are locked into place.  Ask  someone to hold it for you, if possible.  Clearly mark and make sure that you can see:  Any grab bars or handrails.  First and last steps.  Where the edge of each step is.  Use tools that help you move around (mobility aids) if they are needed. These include:  Canes.  Walkers.  Scooters.  Crutches.  Turn on the lights when you go into a dark area. Replace any light bulbs as soon as they burn out.  Set up your furniture so you have a clear path. Avoid moving your furniture around.  If any of your floors are uneven, fix them.  If there are any pets around you, be aware of where they are.  Review your medicines with your doctor. Some medicines can make you feel dizzy. This can increase your chance of falling. Ask your doctor what other things that you can do to help prevent falls. This information is not intended to replace advice given to you by your health care provider. Make sure you discuss any questions you have with your health care provider. Document Released: 08/21/2009 Document Revised: 04/01/2016 Document Reviewed: 11/29/2014 Elsevier Interactive Patient Education  2017 [employer].

## 2017-12-20 NOTE — Progress Notes (Signed)
Subjective:   Brett Bender is a 72 y.o. male who presents for Medicare Annual/Subsequent preventive examination.  Review of Systems:  N/A  Cardiac Risk Factors include: advanced age (>50men, >25 women);hypertension;male gender     Objective:    Vitals: BP 126/78 (BP Location: Left Arm)   Pulse 60   Temp 98 F (36.7 C) (Oral)   Ht 6\' 4"  (1.93 m)   Wt 229 lb (103.9 kg)   BMI 27.87 kg/m   Body mass index is 27.87 kg/m.  Advanced Directives 12/20/2017 12/17/2016 12/17/2015 11/19/2015  Does Patient Have a Medical Advance Directive? Yes Yes Yes No  Type of Advance Directive Living will Living will Living will -  Would patient like information on creating a medical advance directive? - - - No - patient declined information    Tobacco Social History   Tobacco Use  Smoking Status Never Smoker  Smokeless Tobacco Never Used     Counseling given: Not Answered   Clinical Intake:  Pre-visit preparation completed: Yes  Pain : No/denies pain Pain Score: 0-No pain     Nutritional Status: BMI 25 -29 Overweight Nutritional Risks: None Diabetes: No  How often do you need to have someone help you when you read instructions, pamphlets, or other written materials from your doctor or pharmacy?: 1 - Never  Interpreter Needed?: No  Information entered by :: [facility], LPN  Past Medical History:  Diagnosis Date  . Arthritis   . Cancer (HCC)    Basal cell carcinoma  . Complication of anesthesia    slow to wake up  . Enlarged prostate   . Herpes simplex   . History of MRSA infection   . Hypertension   . Kidney stone   . Tendonitis of ankle, left    Past Surgical History:  Procedure Laterality Date  . APPENDECTOMY    . back injection    . CYSTOSCOPY/URETEROSCOPY/HOLMIUM LASER/STENT PLACEMENT Left 12/18/2015   Procedure: CYSTOSCOPY/URETEROSCOPY/HOLMIUM LASER LITHOTRIPSY;  Surgeon: Brett Espy, MD;  Location: [facility] ORS;  Service: Urology;  Laterality: Left;  . LOWER BACK  SURGERY    . NECK SURGERY     Upper neck surgery  . SHOULDER SURGERY Right 11/10/2011   arthroscopic surgery . Dr. Tamala Julian, [facility]  . TONSILLECTOMY     Family History  Problem Relation Age of Onset  . Depression Son   . Diabetes Neg Hx   . Cancer Neg Hx   . Heart attack Neg Hx   . Kidney disease Neg Hx   . Prostate cancer Neg Hx   . Kidney cancer Neg Hx   . Bladder Cancer Neg Hx    Social History   Socioeconomic History  . Marital status: Married    Spouse name: None  . Number of children: 2  . Years of education: None  . Highest education level: Associate degree: occupational, Hotel manager, or vocational program  Social Needs  . Financial resource strain: Not hard at all  . Food insecurity - worry: Never true  . Food insecurity - inability: Never true  . Transportation needs - medical: No  . Transportation needs - non-medical: No  Occupational History  . Occupation: Retired    Comment: former Investment banker, corporate  Tobacco Use  . Smoking status: Never Smoker  . Smokeless tobacco: Never Used  Substance and Sexual Activity  . Alcohol use: No    Alcohol/week: 0.0 oz  . Drug use: No  . Sexual activity: None  Other Topics Concern  .  None  Social History Narrative  . None    Outpatient Encounter Medications as of 12/20/2017  Medication Sig  . amLODipine (NORVASC) 2.5 MG tablet TAKE 1 TABLET BY MOUTH ONCE EVERY EVENING  . atenolol-chlorthalidone (TENORETIC) 50-25 MG tablet TAKE 1/2 TABLET BY MOUTH DAILY  . butalbital-acetaminophen-caffeine (FIORICET, ESGIC) 50-325-40 MG tablet Take 1-2 tablets by mouth every 6 (six) hours as needed for headache.  . diclofenac (VOLTAREN) 50 MG EC tablet TAKE 1 TABLET BY MOUTH TWICE A DAY  . finasteride (PROSCAR) 5 MG tablet TAKE 1 TABLET BY MOUTH ONCE A DAY  . fluticasone (FLONASE) 50 MCG/ACT nasal spray Place 2 sprays into both nostrils daily. (Patient taking differently: Place 2 sprays into both nostrils daily. )  . mirtazapine (REMERON)  30 MG tablet Take 1 tablet by mouth daily.  . tamsulosin (FLOMAX) 0.4 MG CAPS capsule TAKE 1 CAPSULE BY MOUTH ONCE DAILY  . valACYclovir (VALTREX) 500 MG tablet Take 1 tablet (500 mg total) by mouth 2 (two) times daily as needed.  . [DISCONTINUED] ALPRAZolam (XANAX) 0.5 MG tablet 1 tablet at night at needed for insomnia (Patient not taking: Reported on 03/01/2017)  . [DISCONTINUED] cyclobenzaprine (FLEXERIL) 5 MG tablet Take 1 tablet by mouth 2 (two) times daily as needed. Reported on 02/11/2016  . [DISCONTINUED] estrogens, conjugated, (PREMARIN) 0.3 MG tablet Take 1 tablet (0.3 mg total) by mouth daily. Take daily for 21 days then do not take for 7 days. (Patient not taking: Reported on 03/01/2017)  . [DISCONTINUED] HYDROcodone-acetaminophen (NORCO/VICODIN) 5-325 MG tablet Reported on 03/16/2016  . [DISCONTINUED] nortriptyline (PAMELOR) 25 MG capsule Reported on 03/16/2016  . [DISCONTINUED] terbinafine (LAMISIL) 250 MG tablet Take 1 tablet by mouth daily.  . [DISCONTINUED] zolpidem (AMBIEN) 5 MG tablet    No facility-administered encounter medications on file as of 12/20/2017.     Activities of Daily Living In your present state of health, do you have any difficulty performing the following activities: 12/20/2017  Hearing? N  Vision? N  Difficulty concentrating or making decisions? N  Walking or climbing stairs? N  Dressing or bathing? N  Doing errands, shopping? N  Preparing Food and eating ? N  Using the Toilet? N  In the past six months, have you accidently leaked urine? Y  Comment Due to enlarged prostate, post sx 15 years ago.  Do you have problems with loss of bowel control? N  Managing your Medications? N  Managing your Finances? N  Housekeeping or managing your Housekeeping? N  Some recent data might be hidden    Patient Care Team: Brett Sons, MD as PCP - General (Family Medicine) Brett Bender as Physician Assistant (Urology) Brett Bender as Consulting  Physician (Optometry)   Assessment:   This is a routine wellness examination for Brett Bender.  Exercise Activities and Dietary recommendations Current Exercise Habits: Structured exercise class, Type of exercise: strength training/weights;stretching;treadmill;walking, Time (Minutes): 60, Frequency (Times/Week): 6, Weekly Exercise (Minutes/Week): 360, Intensity: Moderate, Exercise limited by: None identified  Goals    None      Fall Risk Fall Risk  12/20/2017 12/17/2016 11/19/2015  Falls in the past year? No No No   Is the patient's home free of loose throw rugs in walkways, pet beds, electrical cords, etc?   yes      Grab bars in the bathroom? yes      Handrails on the stairs?   yes      Adequate lighting?   yes  Timed Get Up  and Go Performed: N/A  Depression Screen PHQ 2/9 Scores 12/20/2017 12/17/2016 02/13/2016 11/19/2015  PHQ - 2 Score 0 1 4 0  PHQ- 9 Score - - 20 -    Cognitive Function: Pt declined screening today.     6CIT Screen 12/17/2016  What Year? 0 points  What month? 0 points  What time? 0 points  Count back from 20 0 points  Months in reverse 0 points  Repeat phrase 0 points  Total Score 0    Immunization History  Administered Date(s) Administered  . Influenza Split 09/03/2006, 08/12/2007, 07/26/2008, 08/09/2012  . Influenza, High Dose Seasonal PF 08/10/2014, 08/18/2015, 08/10/2016, 07/28/2017  . Influenza,inj,Quad PF,6+ Mos 07/31/2013  . Pneumococcal Conjugate-13 11/27/2014  . Pneumococcal Polysaccharide-23 12/22/2011  . Tdap 12/22/2011  . Zoster 11/26/2011  . Zoster Recombinat (Shingrix) 05/25/2017, 07/28/2017    Qualifies for Shingles Vaccine? Up to date  Screening Tests Health Maintenance  Topic Date Due  . TETANUS/TDAP  12/21/2021  . COLONOSCOPY  11/13/2022  . INFLUENZA VACCINE  Completed  . Hepatitis C Screening  Completed  . PNA vac Low Risk Adult  Completed   Cancer Screenings: Lung: Low Dose CT Chest recommended if Age 10-80 years, 30 pack-year  currently smoking OR have quit w/in 15years. Patient does not qualify. Colorectal: Up to date  Additional Screenings:  Hepatitis B/HIV/Syphillis: Pt declines today.  Hepatitis C Screening: Up to date    Plan:  I have personally reviewed and addressed the Medicare Annual Wellness questionnaire and have noted the following in the patient's chart:  A. Medical and social history B. Use of alcohol, tobacco or illicit drugs  C. Current medications and supplements D. Functional ability and status E.  Nutritional status F.  Physical activity G. Advance directives H. List of other physicians I.  Hospitalizations, surgeries, and ER visits in previous 12 months J.  [place] such as hearing and vision if needed, cognitive and depression L. Referrals and appointments - none  In addition, I have reviewed and discussed with patient certain preventive protocols, quality metrics, and best practice recommendations. A written personalized care plan for preventive services as well as general preventive health recommendations were provided to patient.  See attached scanned questionnaire for additional information.   Signed,  Fabio Neighbors, LPN Nurse Health Advisor   Nurse Recommendations: None.

## 2017-12-20 NOTE — Progress Notes (Signed)
Patient: Brett Bender, Male    DOB: 04-18-1946, 72 y.o.   MRN: [id number] Visit Date: 12/20/2017  Today's Provider: Lelon Huh, MD   Chief Complaint  Patient presents with  . Hip Pain  . Hypertension  . Insomnia   Subjective:   He is here today for follow up chronic medications and also complains or persistent right hip pain for several months. He had similar pain in the pas and had orthopedic evaluation inding L2-L3 DDD and states he got relief from injection of steroids into hip. Had been taking tylenol without relief.    Hypertension, follow-up:  BP Readings from Last 3 Encounters:  12/20/17 126/78  03/01/17 125/84  12/17/16 130/83    He was last seen for hypertension 1 years ago.  BP at that visit was 130/82. Management since that visit includes no changes. He reports good compliance with treatment. He is not having side effects.  He is exercising. He is adherent to low salt diet.   Outside blood pressures are checked occasionally. He is experiencing none.  Patient denies exertional chest pressure/discomfort, lower extremity edema and palpitations.   Cardiovascular risk factors include none.  Use of agents associated with hypertension: none.     Weight trend: stable Wt Readings from Last 3 Encounters:  12/20/17 229 lb (103.9 kg)  03/01/17 226 lb 8 oz (102.7 kg)  12/17/16 237 lb 3.2 oz (107.6 kg)    Current diet: well balanced   Insomnia, follow up: Patient was last seen 1 year ago. No changes were made in his medications. He is currently taking Alprazolam as needed to help him sleep and reports it remains effective and well tolerated.   Review of Systems  Constitutional: Negative.   HENT: Negative.   Eyes: Negative.   Respiratory: Negative.   Cardiovascular: Negative.   Gastrointestinal: Negative.   Endocrine: Negative.   Genitourinary: Negative.   Musculoskeletal: Positive for arthralgias, back pain and myalgias.  Skin: Negative.     Allergic/Immunologic: Negative.   Neurological: Negative.   Hematological: Negative.   Psychiatric/Behavioral: Negative.     Social History   Socioeconomic History  . Marital status: Married    Spouse name: Not on file  . Number of children: 2  . Years of education: Not on file  . Highest education level: Associate degree: occupational, Hotel manager, or vocational program  Social Needs  . Financial resource strain: Not hard at all  . Food insecurity - worry: Never true  . Food insecurity - inability: Never true  . Transportation needs - medical: No  . Transportation needs - non-medical: No  Occupational History  . Occupation: Retired    Comment: former Investment banker, corporate  Tobacco Use  . Smoking status: Never Smoker  . Smokeless tobacco: Never Used  Substance and Sexual Activity  . Alcohol use: No    Alcohol/week: 0.0 oz  . Drug use: No  . Sexual activity: Not on file  Other Topics Concern  . Not on file  Social History Narrative  . Not on file    Past Medical History:  Diagnosis Date  . Arthritis   . Cancer (HCC)    Basal cell carcinoma  . Complication of anesthesia    slow to wake up  . Enlarged prostate   . Herpes simplex   . History of MRSA infection   . Hypertension   . Kidney stone   . Tendonitis of ankle, left      Patient Active  Problem List   Diagnosis Date Noted  . Depression 03/16/2016  . Urinary frequency 02/11/2016  . Anxiety 02/11/2016  . Nocturia 02/11/2016  . Kidney stones 01/19/2016  . Left ureteral stone 12/08/2015  . Microscopic hematuria 12/08/2015  . Hydronephrosis of left kidney 12/08/2015  . Renal stone 12/08/2015  . History of nephrolithiasis 11/24/2015  . 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 Basal cell carcinoma of skin 11/18/2015  . Onychomycosis of toenail 11/18/2015  . Neck pain 11/18/2015  . Cellulitis due to MRSA 11/18/2015  . Insomnia 05/01/2015  . BPH (benign prostatic hyperplasia) 12/14/2012  . Headache  09/11/2011  . Uncomplicated herpes simplex 05/08/2009  . LBP (low back pain) 12/03/2008  . Lumbosacral neuritis 12/03/2008  . Essential (primary) hypertension 11/09/1999    Past Surgical History:  Procedure Laterality Date  . APPENDECTOMY    . back injection    . CYSTOSCOPY/URETEROSCOPY/HOLMIUM LASER/STENT PLACEMENT Left 12/18/2015   Procedure: CYSTOSCOPY/URETEROSCOPY/HOLMIUM LASER LITHOTRIPSY;  Surgeon: Hollice Espy, MD;  Location: [facility] ORS;  Service: Urology;  Laterality: Left;  . LOWER BACK SURGERY    . NECK SURGERY     Upper neck surgery  . SHOULDER SURGERY Right 11/10/2011   arthroscopic surgery . Dr. Tamala Julian, [facility]  . TONSILLECTOMY      His family history includes Depression in his son. There is no history of Diabetes, Cancer, Heart attack, Kidney disease, Prostate cancer, Kidney cancer, or Bladder Cancer.      Current Outpatient Medications:  .  amLODipine (NORVASC) 2.5 MG tablet, TAKE 1 TABLET BY MOUTH ONCE EVERY EVENING, Disp: 90 tablet, Rfl: 4 .  atenolol-chlorthalidone (TENORETIC) 50-25 MG tablet, TAKE 1/2 TABLET BY MOUTH DAILY, Disp: 45 tablet, Rfl: 5 .  butalbital-acetaminophen-caffeine (FIORICET, ESGIC) 50-325-40 MG tablet, Take 1-2 tablets by mouth every 6 (six) hours as needed for headache., Disp: 20 tablet, Rfl: 3 .  diclofenac (VOLTAREN) 50 MG EC tablet, TAKE 1 TABLET BY MOUTH TWICE A DAY, Disp: 180 tablet, Rfl: 4 .  finasteride (PROSCAR) 5 MG tablet, TAKE 1 TABLET BY MOUTH ONCE A DAY, Disp: 90 tablet, Rfl: 2 .  fluticasone (FLONASE) 50 MCG/ACT nasal spray, Place 2 sprays into both nostrils daily. (Patient taking differently: Place 2 sprays into both nostrils daily. ), Disp: 16 g, Rfl: 6 .  mirtazapine (REMERON) 30 MG tablet, Take 1 tablet by mouth daily., Disp: , Rfl:  .  tamsulosin (FLOMAX) 0.4 MG CAPS capsule, TAKE 1 CAPSULE BY MOUTH ONCE DAILY, Disp: 90 capsule, Rfl: 3 .  valACYclovir (VALTREX) 500 MG tablet, Take 1 tablet (500 mg total) by mouth 2 (two) times  daily as needed., Disp: 20 tablet, Rfl: 3  Patient Care Team: Birdie Sons, MD as PCP - General (Family Medicine) Nori Riis, PA-C as Physician Assistant (Urology) Odette Fraction as Consulting Physician (Optometry)     Objective:   Vitals:  BP  126/78 (BP Location: Left Arm)     Pulse  60     Temp  98 F (36.7 C) (Oral)     Ht  6\' 4"  (1.93 m)     Wt  229 lb (103.9 kg)      BMI  27.87 kg/m       General Appearance:    Alert, cooperative, no distress  Eyes:    PERRL, conjunctiva/corneas clear, EOM's intact       Lungs:     Clear to auscultation bilaterally, respirations unlabored  Heart:    Regular rate and rhythm  Neurologic:  Awake, alert, oriented x 3. No apparent focal neurological           defect.   MS:   Tender over right SI joint and right latera hip. FROM       Assessment & Plan:     1. Essential (primary) hypertension Well controlled.  Continue current medications.   - EKG 12-Lead - Comprehensive metabolic panel - Lipid panel  2. Benign prostatic hyperplasia with urinary frequency  - PSA  3. Insomnia, unspecified type Stable. Continue current medications.    4. Depression, unspecified depression type Doing well with mirtazepine  5. Right hip pain  - methylPREDNISolone acetate (DEPO-MEDROL) injection 80 mg  Call for orthopedic referral if not improvine with IM depot-medrol above.   Lelon Huh, MD  [facility]

## 2017-12-21 DIAGNOSIS — R350 Frequency of micturition: Secondary | ICD-10-CM | POA: Diagnosis not present

## 2017-12-21 DIAGNOSIS — N401 Enlarged prostate with lower urinary tract symptoms: Secondary | ICD-10-CM | POA: Diagnosis not present

## 2017-12-21 DIAGNOSIS — I10 Essential (primary) hypertension: Secondary | ICD-10-CM | POA: Diagnosis not present

## 2017-12-22 ENCOUNTER — Telehealth: Payer: Self-pay | Admitting: *Deleted

## 2017-12-22 LAB — COMPREHENSIVE METABOLIC PANEL
A/G RATIO: 1.7 (ref 1.2–2.2)
ALBUMIN: 4.3 g/dL (ref 3.5–4.8)
ALK PHOS: 68 IU/L (ref 39–117)
ALT: 22 IU/L (ref 0–44)
AST: 20 IU/L (ref 0–40)
BILIRUBIN TOTAL: 0.6 mg/dL (ref 0.0–1.2)
BUN / CREAT RATIO: 14 (ref 10–24)
BUN: 20 mg/dL (ref 8–27)
CHLORIDE: 99 mmol/L (ref 96–106)
CO2: 26 mmol/L (ref 20–29)
Calcium: 9.8 mg/dL (ref 8.6–10.2)
Creatinine, Ser: 1.39 mg/dL — ABNORMAL HIGH (ref 0.76–1.27)
GFR calc non Af Amer: 51 mL/min/{1.73_m2} — ABNORMAL LOW (ref >59)
GFR, EST AFRICAN AMERICAN: 59 mL/min/{1.73_m2} — AB (ref >59)
GLUCOSE: 92 mg/dL (ref 65–99)
Globulin, Total: 2.6 g/dL (ref 1.5–4.5)
POTASSIUM: 4.4 mmol/L (ref 3.5–5.2)
SODIUM: 139 mmol/L (ref 134–144)
Total Protein: 6.9 g/dL (ref 6.0–8.5)

## 2017-12-22 LAB — LIPID PANEL
CHOLESTEROL TOTAL: 154 mg/dL (ref 100–199)
Chol/HDL Ratio: 2.9 ratio (ref 0.0–5.0)
HDL: 53 mg/dL (ref >39)
LDL Calculated: 81 mg/dL (ref 0–99)
Triglycerides: 102 mg/dL (ref 0–149)
VLDL CHOLESTEROL CAL: 20 mg/dL (ref 5–40)

## 2017-12-22 LAB — PSA

## 2017-12-22 NOTE — Telephone Encounter (Signed)
-----   Message from Birdie Sons, MD sent at 12/22/2017  8:14 AM EST ----- Kidney functions have declined a bit. Need to drink more water. Cholesterol is good. psa is normal. Continue current medications.  Check labs yearly.

## 2017-12-22 NOTE — Telephone Encounter (Signed)
Returning call.

## 2017-12-22 NOTE — Telephone Encounter (Signed)
LMOVM for pt to return call 

## 2017-12-22 NOTE — Telephone Encounter (Signed)
Pt advised and agrees with plan. 

## 2017-12-28 ENCOUNTER — Other Ambulatory Visit: Payer: Self-pay | Admitting: Radiology

## 2017-12-28 DIAGNOSIS — N200 Calculus of kidney: Secondary | ICD-10-CM

## 2017-12-28 NOTE — Progress Notes (Unsigned)
kub

## 2018-01-10 DIAGNOSIS — F329 Major depressive disorder, single episode, unspecified: Secondary | ICD-10-CM | POA: Diagnosis not present

## 2018-02-06 ENCOUNTER — Ambulatory Visit
Admission: RE | Admit: 2018-02-06 | Discharge: 2018-02-06 | Disposition: A | Discharge status: Home / Self Care | Payer: Medicare Other | Source: Ambulatory Visit | Attending: Urology | Admitting: Urology

## 2018-02-06 DIAGNOSIS — N200 Calculus of kidney: Secondary | ICD-10-CM

## 2018-02-06 DIAGNOSIS — Z87442 Personal history of urinary calculi: Secondary | ICD-10-CM | POA: Diagnosis not present

## 2018-02-22 ENCOUNTER — Other Ambulatory Visit: Payer: Medicare Other

## 2018-03-01 ENCOUNTER — Ambulatory Visit: Payer: Medicare Other | Admitting: Urology

## 2018-03-10 ENCOUNTER — Telehealth: Payer: Self-pay | Admitting: Urology

## 2018-03-10 NOTE — Telephone Encounter (Signed)
Patient is coming back for a 1 year follow up and needs a KUB prior but there is no order in. Can you put one in please and thank you?  Sharyn Lull

## 2018-03-10 NOTE — Telephone Encounter (Signed)
Pt informed, will f/u as scheduled.

## 2018-03-10 NOTE — Telephone Encounter (Signed)
Brett Bender had an X-ray on 02/06/2018.  I do not need another one prior to his appointment.

## 2018-03-15 ENCOUNTER — Ambulatory Visit: Payer: Medicare Other | Admitting: Urology

## 2018-04-17 ENCOUNTER — Other Ambulatory Visit: Payer: Self-pay | Admitting: Family Medicine

## 2018-04-17 DIAGNOSIS — I10 Essential (primary) hypertension: Secondary | ICD-10-CM

## 2018-04-17 DIAGNOSIS — F329 Major depressive disorder, single episode, unspecified: Secondary | ICD-10-CM

## 2018-04-17 DIAGNOSIS — F32A Depression, unspecified: Secondary | ICD-10-CM

## 2018-04-17 NOTE — Progress Notes (Signed)
11:48 AM   Brett Bender 03/25/1946 [id number]  Referring provider: Birdie Sons, MD [address] [place], [place] [address]  Chief Complaint  Patient presents with  . Urinary Frequency    HPI: Patient is a 72 year old WM who presents today for his one year follow up for nephrolithiasis, frequency and nocturia.    Nephrolithiasis Patient underwent left URS/LL/left ureteral stent placement with subsequent stent removal for a left distal (84mm) stone in 12/2015.  Follow up RUS confirmed resolution of the left hydronephrosis.  Bilateral nephrolithiasis is still present.  Stone composition was calcium oxalate dihydrate 30%, calcium oxalate monohydrate 60% and calcium phosphate 10%.  L[facility]olink results noted he was at increased risk for stone formation due to borderline hyperoxaluria, hypocitraturia, high urine pH and mild hyperurlcosuria.  He was given instruction on dietary changes and encouraged to drink 2.5 L of water daily.  KUB taken on 02/15/2017 noted no nephrolithiasis.   KUB taken on 02/06/2018 noted no evident renal or ureteral calculi by radiography. No bowel obstruction or free air evident.  He has not had any flank pain, gross hematuria or passage of fragments.    Frequency He is currently taking tamsulosin and finasteride.  His previous IPSS score was 20/3 and previous PVR was 25 mL.   He does admit to not drinking enough water.  Myrbetriq 25 mg daily was not effective in controlling his symptoms.  His current IPSS score is 16/3.  Cystoscopy in 12/2015 did not reveal any worrisome findings.    Nocturia He is still having nocturia x 2.  Not undergone a sleep study.  He states his psychiatrist is also pressuring him to have a sleep study and will most likely have one in the fall.  Patient denies any gross hematuria, dysuria or suprapubic/flank pain.  Patient denies any fevers, chills, nausea or vomiting. .   IPSS    Row Name 04/18/18 1100         International Prostate Symptom Score   How often have you had the sensation of not emptying your bladder?  About half the time     How often have you had to urinate less than every two hours?  About half the time     How often have you found you stopped and started again several times when you urinated?  Less than half the time     How often have you found it difficult to postpone urination?  Less than 1 in 5 times     How often have you had a weak urinary stream?  About half the time     How often have you had to strain to start urination?  Less than half the time     How many times did you typically get up at night to urinate?  2 Times     Total IPSS Score  16       Quality of Life due to urinary symptoms   If you were to spend the rest of your life with your urinary condition just the way it is now how would you feel about that?  Mixed        Score:  1-7 Mild 8-19 Moderate 20-35 Severe   PMH: Past Medical History:  Diagnosis Date  . Arthritis   . Cancer (HCC)    Basal cell carcinoma  . Complication of anesthesia    slow to wake up  . Enlarged prostate   . Herpes  simplex   . History of MRSA infection   . Hypertension   . Kidney stone   . Tendonitis of ankle, left     Surgical History: Past Surgical History:  Procedure Laterality Date  . APPENDECTOMY    . back injection    . CYSTOSCOPY/URETEROSCOPY/HOLMIUM LASER/STENT PLACEMENT Left 12/18/2015   Procedure: CYSTOSCOPY/URETEROSCOPY/HOLMIUM LASER LITHOTRIPSY;  Surgeon: Hollice Espy, MD;  Location: [facility] ORS;  Service: Urology;  Laterality: Left;  . LOWER BACK SURGERY    . NECK SURGERY     Upper neck surgery  . SHOULDER SURGERY Right 11/10/2011   arthroscopic surgery . Dr. Tamala Julian, [facility]  . TONSILLECTOMY      Home Medications:  Allergies as of 04/18/2018      Reactions   Penicillins    Other reaction(s): Stomach Ache      Medication List        Accurate as of 04/18/18 11:48 AM. Always use your most recent med  list.          amLODipine 2.5 MG tablet Commonly known as:  NORVASC TAKE 1 TABLET BY MOUTH ONCE EVERY EVENING   atenolol-chlorthalidone 50-25 MG tablet Commonly known as:  TENORETIC TAKE 1/2 TABLET BY MOUTH DAILY   butalbital-acetaminophen-caffeine 50-325-40 MG tablet Commonly known as:  FIORICET, ESGIC Take 1-2 tablets by mouth every 6 (six) hours as needed for headache.   diclofenac 50 MG EC tablet Commonly known as:  VOLTAREN TAKE 1 TABLET BY MOUTH TWICE A DAY   finasteride 5 MG tablet Commonly known as:  PROSCAR Take 1 tablet (5 mg total) by mouth daily.   fluticasone 50 MCG/ACT nasal spray Commonly known as:  FLONASE Place 2 sprays into both nostrils daily.   mirtazapine 30 MG tablet Commonly known as:  REMERON Take 1 tablet by mouth daily.   tamsulosin 0.4 MG Caps capsule Commonly known as:  FLOMAX Take 1 capsule (0.4 mg total) by mouth daily.   valACYclovir 500 MG tablet Commonly known as:  VALTREX Take 1 tablet (500 mg total) by mouth 2 (two) times daily as needed.       Allergies:  Allergies  Allergen Reactions  . Penicillins     Other reaction(s): Stomach Ache    Family History: Family History  Problem Relation Age of Onset  . Depression Son   . Diabetes Neg Hx   . Cancer Neg Hx   . Heart attack Neg Hx   . Kidney disease Neg Hx   . Prostate cancer Neg Hx   . Kidney cancer Neg Hx   . Bladder Cancer Neg Hx     Social History:  reports that he has never smoked. He has never used smokeless tobacco. He reports that he does not drink alcohol or use drugs.  ROS: UROLOGY Frequent Urination?: No Hard to postpone urination?: No Burning/pain with urination?: No Get up at night to urinate?: Yes Leakage of urine?: No Urine stream starts and stops?: No Trouble starting stream?: No Do you have to strain to urinate?: No Blood in urine?: No Urinary tract infection?: No Sexually transmitted disease?: No Injury to kidneys or bladder?: No Painful  intercourse?: No Weak stream?: No Erection problems?: No Penile pain?: No  Gastrointestinal Nausea?: No Vomiting?: No Indigestion/heartburn?: No Diarrhea?: No Constipation?: No  Constitutional Fever: No Night sweats?: No Weight loss?: No Fatigue?: No  Skin Skin rash/lesions?: No Itching?: No  Eyes Blurred vision?: No Double vision?: No  Ears/Nose/Throat Sore throat?: No Sinus problems?: No  Hematologic/Lymphatic Swollen glands?:  No Easy bruising?: No  Cardiovascular Leg swelling?: No Chest pain?: No  Respiratory Cough?: No Shortness of breath?: No  Endocrine Excessive thirst?: No  Musculoskeletal Back pain?: No Joint pain?: No  Neurological Headaches?: No Dizziness?: No  Psychologic Depression?: No Anxiety?: No  Physical Exam: BP (!) 171/96 (BP Location: Right Arm, Patient Position: Sitting, Cuff Size: Large)   Pulse (!) 53   Ht 6' 3.5" (1.918 m)   Wt 219 lb 11.2 oz (99.7 kg)   SpO2 99%   BMI 27.10 kg/m   Constitutional: Well nourished. Alert and oriented, No acute distress. HEENT:  AT, moist mucus membranes. Trachea midline, no masses. Cardiovascular: No clubbing, cyanosis, or edema. Respiratory: Normal respiratory effort, no increased work of breathing. GI: Abdomen is soft, non tender, non distended, no abdominal masses. Liver and spleen not palpable.  No hernias appreciated.  Stool sample for occult testing is not indicated.   GU: No CVA tenderness.  No bladder fullness or masses.  Patient with circumcised phallus.  Urethral meatus is patent.  No penile discharge. No penile lesions or rashes. Scrotum without lesions, cysts, rashes and/or edema.  Testicles are located scrotally bilaterally. No masses are appreciated in the testicles. Left and right epididymis are normal. Rectal: Patient with  normal sphincter tone. Anus and perineum without scarring or rashes. No rectal masses are appreciated. Prostate is approximately 45 grams, no nodules  are appreciated. Seminal vesicles are normal. Skin: No rashes, bruises or suspicious lesions. Lymph: No cervical or inguinal adenopathy. Neurologic: Grossly intact, no focal deficits, moving all 4 extremities. Psychiatric: Normal mood and affect.   Laboratory Data: Lab Results  Component Value Date   WBC 8.1 12/17/2015   HGB 15.0 12/17/2015   HCT 45.1 12/17/2015   MCV 94.6 12/17/2015   PLT 213 12/17/2015    Lab Results  Component Value Date   CREATININE 1.39 (H) 12/21/2017    Lab Results  Component Value Date   PSA 0.1 11/28/2014   PSA History  <0.1 ng/mL on 11/24/2015  <0.1 ng/mL on 12/21/2017   Lab Results  Component Value Date   TSH 1.02 02/27/2012       Component Value Date/Time   CHOL 154 12/21/2017 0809   HDL 53 12/21/2017 0809   CHOLHDL 2.9 12/21/2017 0809   LDLCALC 81 12/21/2017 0809    Lab Results  Component Value Date   AST 20 12/21/2017   Lab Results  Component Value Date   ALT 22 12/21/2017   I have reviewed the labs.  Pertinent Imaging: CLINICAL DATA:  History of nephrolithiasis  EXAM: ABDOMEN - 1 VIEW  COMPARISON:  February 23, 2017  FINDINGS: There are no abnormal calcifications suggesting renal or ureteral calculi. Probable phlebolith mid right pelvis, stable. There is moderate stool throughout the colon. No bowel obstruction or free air.  IMPRESSION: No evident renal or ureteral calculi by radiography. No bowel obstruction or free air evident.   Electronically Signed   By: Lowella Grip III M.D.   On: 02/06/2018 09:33  I have independently reviewed the films  Assessment & Plan:    1. Urinary frequency  - I PSS score is 16/3, it is improving  - cystoscopy in 12/2015 was negative  - continue the tamsulosin 0.4 mg daily and the finasteride 5 mg daily; refills given  - increase water intake  - RTC in one year for IPSS score, exam, PSA, KUB  2. Nocturia Patient still has not undergone a sleep study as of this  visit States psychiatrist is getting one in the fall   3. History of nephrolithiasis KUB taken on February 06, 2018 noted no nephrolithiasis RTC in one year for KUB and office visit Will contact us in the interim if he should experience hematuria or flank pain.      Return in about 1 year (around 04/19/2019) for IPSS, PSA and exam.  These notes generated with voice recognition software. I apologize for typographical errors.  Zara Council, PA-C  [facility] [address] [place], [place] [address] [phone]

## 2018-04-18 ENCOUNTER — Ambulatory Visit (INDEPENDENT_AMBULATORY_CARE_PROVIDER_SITE_OTHER): Payer: Medicare Other | Admitting: Urology

## 2018-04-18 ENCOUNTER — Encounter: Payer: Self-pay | Admitting: Urology

## 2018-04-18 VITALS — BP 171/96 | HR 53 | Ht 75.5 in | Wt 219.7 lb

## 2018-04-18 DIAGNOSIS — N401 Enlarged prostate with lower urinary tract symptoms: Secondary | ICD-10-CM | POA: Diagnosis not present

## 2018-04-18 DIAGNOSIS — R351 Nocturia: Secondary | ICD-10-CM | POA: Diagnosis not present

## 2018-04-18 DIAGNOSIS — Z87442 Personal history of urinary calculi: Secondary | ICD-10-CM | POA: Diagnosis not present

## 2018-04-18 DIAGNOSIS — N138 Other obstructive and reflux uropathy: Secondary | ICD-10-CM | POA: Diagnosis not present

## 2018-04-18 MED ORDER — TAMSULOSIN HCL 0.4 MG PO CAPS: 0.4000 mg | ORAL_CAPSULE | Freq: Every day | ORAL | 3 refills | Status: DC

## 2018-04-18 MED ORDER — FINASTERIDE 5 MG PO TABS: 5.0000 mg | ORAL_TABLET | Freq: Every day | ORAL | 3 refills | Status: DC

## 2018-04-27 ENCOUNTER — Other Ambulatory Visit: Payer: Self-pay | Admitting: Family Medicine

## 2018-05-25 DIAGNOSIS — L820 Inflamed seborrheic keratosis: Secondary | ICD-10-CM | POA: Diagnosis not present

## 2018-05-25 DIAGNOSIS — L570 Actinic keratosis: Secondary | ICD-10-CM | POA: Diagnosis not present

## 2018-05-25 DIAGNOSIS — L280 Lichen simplex chronicus: Secondary | ICD-10-CM | POA: Diagnosis not present

## 2018-05-25 DIAGNOSIS — L578 Other skin changes due to chronic exposure to nonionizing radiation: Secondary | ICD-10-CM | POA: Diagnosis not present

## 2018-05-25 DIAGNOSIS — Z85828 Personal history of other malignant neoplasm of skin: Secondary | ICD-10-CM | POA: Diagnosis not present

## 2018-05-25 DIAGNOSIS — L821 Other seborrheic keratosis: Secondary | ICD-10-CM | POA: Diagnosis not present

## 2018-06-16 ENCOUNTER — Encounter: Payer: Self-pay | Admitting: Family Medicine

## 2018-06-16 ENCOUNTER — Ambulatory Visit (INDEPENDENT_AMBULATORY_CARE_PROVIDER_SITE_OTHER): Payer: Medicare Other | Admitting: Family Medicine

## 2018-06-16 VITALS — BP 130/81 | HR 51 | Temp 97.5°F | Resp 16 | Ht 76.0 in | Wt 220.0 lb

## 2018-06-16 DIAGNOSIS — M5417 Radiculopathy, lumbosacral region: Secondary | ICD-10-CM

## 2018-06-16 DIAGNOSIS — M545 Low back pain: Secondary | ICD-10-CM | POA: Diagnosis not present

## 2018-06-16 MED ORDER — PREDNISONE 10 MG PO TABS: ORAL_TABLET | ORAL | 0 refills | Status: AC

## 2018-06-16 MED ORDER — HYDROCODONE-ACETAMINOPHEN 7.5-325 MG PO TABS: 1.0000 | ORAL_TABLET | Freq: Four times a day (QID) | ORAL | 0 refills | Status: DC | PRN

## 2018-06-16 MED ORDER — CYCLOBENZAPRINE HCL 5 MG PO TABS: 5.0000 mg | ORAL_TABLET | Freq: Every evening | ORAL | 1 refills | Status: DC | PRN

## 2018-06-16 NOTE — Progress Notes (Signed)
Patient: Brett Bender Male    DOB: [birth year]   72 y.o.   MRN: [id number] Visit Date: 06/16/2018  Today's Provider: Lelon Huh, MD   Chief Complaint  Patient presents with  . Back Pain   Subjective:    HPI  Patient is here concerning recurrent back pain. Patient states he has bulging disks at L2 and L3. Patient states he has had a flare up in pain in that area for the last 2 weeks. Has had similar episodes in past which responded well to steroid injections. He called [facility] and has an appointment with Dr. Ellene Route in September, but would like something for pain to take until then . He states pain is worse when he gets up in the morning and improves when he exercises. Is taking diclofenac twice every day with minimal improvement. Pain radiates into right hip.    Allergies  Allergen Reactions  . Penicillins     Other reaction(s): Stomach Ache     Current Outpatient Medications:  .  amLODipine (NORVASC) 2.5 MG tablet, TAKE 1 TABLET BY MOUTH ONCE EVERY EVENING, Disp: 90 tablet, Rfl: 4 .  atenolol-chlorthalidone (TENORETIC) 50-25 MG tablet, TAKE 1/2 TABLET BY MOUTH ONCE A DAY, Disp: 45 tablet, Rfl: 11 .  butalbital-acetaminophen-caffeine (FIORICET, ESGIC) 50-325-40 MG tablet, Take 1-2 tablets by mouth every 6 (six) hours as needed for headache., Disp: 20 tablet, Rfl: 3 .  diclofenac (VOLTAREN) 50 MG EC tablet, TAKE 1 TABLET BY MOUTH TWICE A DAY, Disp: 180 tablet, Rfl: 4 .  finasteride (PROSCAR) 5 MG tablet, Take 1 tablet (5 mg total) by mouth daily., Disp: 90 tablet, Rfl: 3 .  fluticasone (FLONASE) 50 MCG/ACT nasal spray, Place 2 sprays into both nostrils daily. (Patient taking differently: Place 2 sprays into both nostrils daily. ), Disp: 16 g, Rfl: 6 .  mirtazapine (REMERON) 30 MG tablet, Take 1 tablet by mouth daily., Disp: , Rfl:  .  tamsulosin (FLOMAX) 0.4 MG CAPS capsule, Take 1 capsule (0.4 mg total) by mouth daily., Disp: 90 capsule,  Rfl: 3 .  valACYclovir (VALTREX) 500 MG tablet, Take 1 tablet (500 mg total) by mouth 2 (two) times daily as needed., Disp: 20 tablet, Rfl: 3  Review of Systems  Constitutional: Negative for appetite change, chills and fever.  Respiratory: Negative for chest tightness, shortness of breath and wheezing.   Cardiovascular: Negative for chest pain and palpitations.  Gastrointestinal: Negative for abdominal pain, nausea and vomiting.  Genitourinary: Negative for hematuria.  Musculoskeletal: Positive for back pain.    Social History   Tobacco Use  . Smoking status: Never Smoker  . Smokeless tobacco: Never Used  Substance Use Topics  . Alcohol use: No    Alcohol/week: 0.0 standard drinks   Objective:   BP 130/81 (BP Location: Right Arm, Cuff Size: Large)   Pulse (!) 51   Temp (!) 97.5 F (36.4 C) (Oral)   Resp 16   Ht 6\' 4"  (1.93 m)   Wt 220 lb (99.8 kg)   SpO2 96%   BMI 26.78 kg/m  Vitals:   06/16/18 1040 06/16/18 1042  BP: (!) 155/87 130/81  Pulse: (!) 51   Resp: 16   Temp: (!) 97.5 F (36.4 C)   TempSrc: Oral   SpO2: 96%   Weight: 220 lb (99.8 kg)   Height: 6\' 4"  (1.93 m)      Physical Exam  General appearance: alert, well developed, well nourished, cooperative  and in no distress Head: Normocephalic, without obvious abnormality, atraumatic Respiratory: Respirations even and unlabored, normal respiratory rate Extremities: No gross deformities Skin: Skin color, texture, turgor normal. No rashes seen  MS: Mild tenderness lumbar spine and right para lumbar muscles.      Assessment & Plan:     1. Lumbosacral neuritis  - cyclobenzaprine (FLEXERIL) 5 MG tablet; Take 1-2 tablets (5-10 mg total) by mouth at bedtime as needed for muscle spasms.  Dispense: 30 tablet; Refill: 1 - predniSONE (DELTASONE) 10 MG tablet; 6 tablets for 2 days, then 5 for 2 days, then 4 for 2 days, then 3 for 2 days, then 2 for 2 days, then 1 for 2 days.  Dispense: 42 tablet; Refill: 0  2.  Acute right-sided low back pain, with sciatica presence unspecified  - HYDROcodone-acetaminophen (NORCO) 7.5-325 MG tablet; Take 1 tablet by mouth every 6 (six) hours as needed for up to 5 days for moderate pain.  Dispense: 20 tablet; Refill: 0  Follow up Dr. Ellene Route in September as scheduled.       Lelon Huh, MD  [facility]

## 2018-06-23 ENCOUNTER — Other Ambulatory Visit: Payer: Self-pay | Admitting: Family Medicine

## 2018-06-27 DIAGNOSIS — F329 Major depressive disorder, single episode, unspecified: Secondary | ICD-10-CM | POA: Diagnosis not present

## 2018-07-04 ENCOUNTER — Telehealth: Payer: Self-pay | Admitting: Family Medicine

## 2018-07-04 DIAGNOSIS — M545 Low back pain: Secondary | ICD-10-CM

## 2018-07-04 MED ORDER — HYDROCODONE-ACETAMINOPHEN 7.5-325 MG PO TABS
1.0000 | ORAL_TABLET | Freq: Four times a day (QID) | ORAL | 0 refills | Status: DC | PRN
Start: 2018-07-04 — End: 2018-07-14

## 2018-07-04 NOTE — Telephone Encounter (Signed)
Please advise refill? 

## 2018-07-04 NOTE — Telephone Encounter (Signed)
Pt contacted office for refill request on the following medications:  HYDROcodone-acetaminophen ([place]) 7.5-325 MG tablet   [place]  Last Rx: 06/16/18 LOV: 06/16/18 Pt stated that he isn't able to see the back doctor until 07/19/18 and is requesting a refill. Please advise. Thanks TNP

## 2018-07-14 ENCOUNTER — Ambulatory Visit
Admission: RE | Admit: 2018-07-14 | Discharge: 2018-07-14 | Disposition: A | Discharge status: Home / Self Care | Payer: Medicare Other | Source: Ambulatory Visit | Attending: Student | Admitting: Student

## 2018-07-14 ENCOUNTER — Other Ambulatory Visit: Payer: Self-pay | Admitting: Student

## 2018-07-14 ENCOUNTER — Other Ambulatory Visit: Payer: Self-pay | Admitting: Family Medicine

## 2018-07-14 DIAGNOSIS — M545 Low back pain: Secondary | ICD-10-CM | POA: Diagnosis not present

## 2018-07-14 DIAGNOSIS — M5416 Radiculopathy, lumbar region: Secondary | ICD-10-CM

## 2018-07-14 DIAGNOSIS — G8929 Other chronic pain: Secondary | ICD-10-CM | POA: Diagnosis not present

## 2018-07-14 DIAGNOSIS — M5126 Other intervertebral disc displacement, lumbar region: Secondary | ICD-10-CM | POA: Diagnosis not present

## 2018-07-14 MED ORDER — METHYLPREDNISOLONE ACETATE 40 MG/ML INJ SUSP (RADIOLOG
120.0000 mg | Freq: Once | INTRAMUSCULAR | Status: AC
  Administered 2018-07-14: 120 mg via EPIDURAL

## 2018-07-14 MED ORDER — IOPAMIDOL (ISOVUE-M 200) INJECTION 41%
1.0000 mL | Freq: Once | INTRAMUSCULAR | Status: AC
  Administered 2018-07-14: 1 mL via EPIDURAL

## 2018-07-14 NOTE — Discharge Instructions (Signed)

## 2018-07-19 ENCOUNTER — Other Ambulatory Visit: Payer: Self-pay | Admitting: Student

## 2018-07-19 DIAGNOSIS — M5416 Radiculopathy, lumbar region: Secondary | ICD-10-CM

## 2018-07-20 ENCOUNTER — Other Ambulatory Visit: Payer: Self-pay | Admitting: Family Medicine

## 2018-07-20 DIAGNOSIS — M545 Low back pain: Secondary | ICD-10-CM

## 2018-07-21 NOTE — Telephone Encounter (Signed)
Pt called this morning asking if the medication and if it has been filled.  He is waiting to get an MRI done so he can see what to do next with the surgeon  CB#  [phone]  Thanks  Con Memos

## 2018-07-21 NOTE — Telephone Encounter (Signed)
Patient called again stating he needs the Norco because of severe back pain.

## 2018-07-24 ENCOUNTER — Ambulatory Visit
Admission: RE | Admit: 2018-07-24 | Discharge: 2018-07-24 | Disposition: A | Discharge status: Home / Self Care | Payer: Medicare Other | Source: Ambulatory Visit | Attending: Student | Admitting: Student

## 2018-07-24 DIAGNOSIS — S3992XA Unspecified injury of lower back, initial encounter: Secondary | ICD-10-CM | POA: Diagnosis not present

## 2018-07-24 DIAGNOSIS — M8938 Hypertrophy of bone, other site: Secondary | ICD-10-CM | POA: Diagnosis not present

## 2018-07-24 DIAGNOSIS — M5416 Radiculopathy, lumbar region: Secondary | ICD-10-CM

## 2018-07-24 DIAGNOSIS — M48061 Spinal stenosis, lumbar region without neurogenic claudication: Secondary | ICD-10-CM | POA: Insufficient documentation

## 2018-07-24 DIAGNOSIS — M545 Low back pain: Secondary | ICD-10-CM | POA: Diagnosis not present

## 2018-07-26 ENCOUNTER — Other Ambulatory Visit: Payer: Self-pay | Admitting: Family Medicine

## 2018-07-26 DIAGNOSIS — M5417 Radiculopathy, lumbosacral region: Secondary | ICD-10-CM

## 2018-08-01 DIAGNOSIS — M48061 Spinal stenosis, lumbar region without neurogenic claudication: Secondary | ICD-10-CM | POA: Diagnosis not present

## 2018-08-09 DIAGNOSIS — M5416 Radiculopathy, lumbar region: Secondary | ICD-10-CM | POA: Diagnosis not present

## 2018-08-09 DIAGNOSIS — M5136 Other intervertebral disc degeneration, lumbar region: Secondary | ICD-10-CM | POA: Diagnosis not present

## 2018-08-09 DIAGNOSIS — M48062 Spinal stenosis, lumbar region with neurogenic claudication: Secondary | ICD-10-CM | POA: Diagnosis not present

## 2018-08-14 ENCOUNTER — Other Ambulatory Visit: Payer: Self-pay

## 2018-08-14 ENCOUNTER — Encounter
Admission: RE | Admit: 2018-08-14 | Discharge: 2018-08-14 | Disposition: A | Discharge status: Home / Self Care | Payer: Medicare Other | Source: Ambulatory Visit | Attending: Neurosurgery | Admitting: Neurosurgery

## 2018-08-14 ENCOUNTER — Ambulatory Visit
Admission: RE | Admit: 2018-08-14 | Discharge: 2018-08-14 | Disposition: A | Discharge status: Home / Self Care | Payer: Medicare Other | Source: Ambulatory Visit | Attending: Neurosurgery | Admitting: Neurosurgery

## 2018-08-14 ENCOUNTER — Other Ambulatory Visit: Payer: Self-pay | Admitting: Family Medicine

## 2018-08-14 DIAGNOSIS — M48062 Spinal stenosis, lumbar region with neurogenic claudication: Secondary | ICD-10-CM | POA: Insufficient documentation

## 2018-08-14 DIAGNOSIS — M5106 Intervertebral disc disorders with myelopathy, lumbar region: Secondary | ICD-10-CM | POA: Insufficient documentation

## 2018-08-14 DIAGNOSIS — Z01818 Encounter for other preprocedural examination: Secondary | ICD-10-CM | POA: Insufficient documentation

## 2018-08-14 DIAGNOSIS — Z01810 Encounter for preprocedural cardiovascular examination: Secondary | ICD-10-CM | POA: Diagnosis not present

## 2018-08-14 DIAGNOSIS — I10 Essential (primary) hypertension: Secondary | ICD-10-CM | POA: Insufficient documentation

## 2018-08-14 DIAGNOSIS — I493 Ventricular premature depolarization: Secondary | ICD-10-CM | POA: Diagnosis not present

## 2018-08-14 DIAGNOSIS — M5116 Intervertebral disc disorders with radiculopathy, lumbar region: Secondary | ICD-10-CM | POA: Insufficient documentation

## 2018-08-14 HISTORY — DX: Other specified postprocedural states: Z98.890

## 2018-08-14 HISTORY — DX: Depression, unspecified: F32.A

## 2018-08-14 HISTORY — DX: Migraine, unspecified, not intractable, without status migrainosus: G43.909

## 2018-08-14 HISTORY — DX: Nausea with vomiting, unspecified: R11.2

## 2018-08-14 HISTORY — DX: Major depressive disorder, single episode, unspecified: F32.9

## 2018-08-14 HISTORY — DX: Personal history of urinary calculi: Z87.442

## 2018-08-14 HISTORY — DX: Spinal stenosis, lumbar region without neurogenic claudication: M48.061

## 2018-08-14 LAB — URINALYSIS, ROUTINE W REFLEX MICROSCOPIC
Bilirubin Urine: NEGATIVE
Glucose, UA: NEGATIVE mg/dL
HGB URINE DIPSTICK: NEGATIVE
Ketones, ur: NEGATIVE mg/dL
Leukocytes, UA: NEGATIVE
Nitrite: NEGATIVE
PH: 7 (ref 5.0–8.0)
PROTEIN: NEGATIVE mg/dL
Specific Gravity, Urine: 1.016 (ref 1.005–1.030)

## 2018-08-14 LAB — TYPE AND SCREEN
ABO/RH(D): O POS
Antibody Screen: NEGATIVE

## 2018-08-14 LAB — CBC
HCT: 51.1 % (ref 40.0–52.0)
Hemoglobin: 17.2 g/dL (ref 13.0–18.0)
MCH: 32.8 pg (ref 26.0–34.0)
MCHC: 33.7 g/dL (ref 32.0–36.0)
MCV: 97.3 fL (ref 80.0–100.0)
Platelets: 225 10*3/uL (ref 150–440)
RBC: 5.26 MIL/uL (ref 4.40–5.90)
RDW: 14.1 % (ref 11.5–14.5)
WBC: 8.1 10*3/uL (ref 3.8–10.6)

## 2018-08-14 LAB — BASIC METABOLIC PANEL
ANION GAP: 8 (ref 5–15)
BUN: 22 mg/dL (ref 8–23)
CO2: 33 mmol/L — ABNORMAL HIGH (ref 22–32)
CREATININE: 0.86 mg/dL (ref 0.61–1.24)
Calcium: 9.6 mg/dL (ref 8.9–10.3)
Chloride: 98 mmol/L (ref 98–111)
GFR calc non Af Amer: >60 mL/min (ref >60)
GLUCOSE: 110 mg/dL — AB (ref 70–99)
Potassium: 3.7 mmol/L (ref 3.5–5.1)
Sodium: 139 mmol/L (ref 135–145)

## 2018-08-14 LAB — PROTIME-INR
INR: 0.87
Prothrombin Time: 11.8 seconds (ref 11.4–15.2)

## 2018-08-14 LAB — APTT: APTT: 29 s (ref 24–36)

## 2018-08-14 NOTE — Progress Notes (Signed)
EKG results of today relayed to Dr. Andree Elk (anesthesia); ok to proceed with surgery.

## 2018-08-14 NOTE — Patient Instructions (Signed)
Your procedure is scheduled on: Monday, August 21, 2018 Report to Day Surgery on the 2nd floor of the [facility]. To find out your arrival time, please call [phone] between 1PM - 3PM on: Friday, August 18, 2018  REMEMBER: Instructions that are not followed completely may result in serious medical risk, up to and including death; or upon the discretion of your surgeon and anesthesiologist your surgery may need to be rescheduled.  Do not eat food after midnight the night before surgery.  No gum chewing, lozengers or hard candies.  You may however, drink CLEAR liquids up to 2 hours before you are scheduled to arrive for your surgery. Do not drink anything within 2 hours of the start of your surgery.  Clear liquids include: - water  - apple juice without pulp - gatorade - black coffee or tea (Do NOT add milk or creamers to the coffee or tea) Do NOT drink anything that is not on this list.  No Alcohol for 24 hours before or after surgery.  No Smoking including e-cigarettes for 24 hours prior to surgery.  No chewable tobacco products for at least 6 hours prior to surgery.  No nicotine patches on the day of surgery.  On the morning of surgery brush your teeth with toothpaste and water, you may rinse your mouth with mouthwash if you wish. Do not swallow any toothpaste or mouthwash.  Notify your doctor if there is any change in your medical condition (cold, fever, infection).  Do not wear jewelry, make-up, hairpins, clips or nail polish.  Do not wear lotions, powders, or perfumes. You may wear deodorant.  Do not shave 48 hours prior to surgery. Men may shave face and neck.  Contacts and dentures may not be worn into surgery.  Do not bring valuables to the hospital, including drivers license, insurance or credit cards.   is not responsible for any belongings or valuables.   TAKE THESE MEDICATIONS THE MORNING OF SURGERY:  1.  Finasteride 2.  Hydrocodone (if  needed for pain) 3.  Tamsulosin  Use CHG Soap as directed on instruction sheet.  NOW!  Stop aspirin, diclofenac Anti-inflammatories (NSAIDS) such as Advil, Aleve, Ibuprofen, Motrin, Naproxen, Naprosyn and Aspirin based products such as Excedrin, [place]ys Powder, BC Powder. (May take Tylenol or Acetaminophen if needed.)  NOW!  Stop ANY OVER THE COUNTER supplements until after surgery.  Wear comfortable clothing (specific to your surgery type) to the hospital.  Plan for stool softeners for home use.  If you are being admitted to the hospital overnight, leave your suitcase in the car. After surgery it may be brought to your room.  If you are being discharged the day of surgery, you will not be allowed to drive home. You will need a responsible adult to drive you home and stay with you that night.   If you are taking public transportation, you will need to have a responsible adult with you. Please confirm with your physician that it is acceptable to use public transportation.   Please call [phone] if you have any questions about these instructions.

## 2018-08-16 ENCOUNTER — Ambulatory Visit (INDEPENDENT_AMBULATORY_CARE_PROVIDER_SITE_OTHER): Payer: Medicare Other | Admitting: Family Medicine

## 2018-08-16 ENCOUNTER — Encounter: Payer: Self-pay | Admitting: Family Medicine

## 2018-08-16 VITALS — BP 122/82 | HR 68 | Temp 97.7°F | Resp 16 | Wt 215.0 lb

## 2018-08-16 DIAGNOSIS — I10 Essential (primary) hypertension: Secondary | ICD-10-CM | POA: Diagnosis not present

## 2018-08-16 DIAGNOSIS — F32A Depression, unspecified: Secondary | ICD-10-CM

## 2018-08-16 DIAGNOSIS — F329 Major depressive disorder, single episode, unspecified: Secondary | ICD-10-CM

## 2018-08-16 DIAGNOSIS — Z23 Encounter for immunization: Secondary | ICD-10-CM | POA: Diagnosis not present

## 2018-08-16 DIAGNOSIS — G4700 Insomnia, unspecified: Secondary | ICD-10-CM | POA: Diagnosis not present

## 2018-08-16 DIAGNOSIS — M5417 Radiculopathy, lumbosacral region: Secondary | ICD-10-CM | POA: Diagnosis not present

## 2018-08-16 MED ORDER — MIRTAZAPINE 30 MG PO TABS: 30.0000 mg | ORAL_TABLET | Freq: Every day | ORAL | 5 refills | Status: DC

## 2018-08-16 NOTE — Progress Notes (Signed)
Patient: Brett Bender Male    DOB: [birth year]   72 y.o.   MRN: [id number] Visit Date: 08/16/2018  Today's Provider: Lelon Huh, MD   Chief Complaint  Patient presents with  . Hypertension  . Insomnia  . Depression  . Back Pain   Subjective:    HPI  Hypertension, follow-up:  BP Readings from Last 3 Encounters:  08/16/18 122/82  08/14/18 (!) 168/99  07/14/18 (!) 167/96    He was last seen for hypertension 8 months ago.  BP at that visit was 126/78. Management since that visit includes no changes. He reports good compliance with treatment. He is not having side effects.  He is not exercising. He is not adherent to low salt diet.   Outside blood pressures are not being checked. He is experiencing none.  Patient denies chest pain, chest pressure/discomfort, claudication, dyspnea, exertional chest pressure/discomfort, fatigue, irregular heart beat, lower extremity edema, near-syncope, orthopnea, palpitations, paroxysmal nocturnal dyspnea, syncope and tachypnea.   Cardiovascular risk factors include advanced age (older than 48 for men, 28 for women), hypertension and male gender.  Use of agents associated with hypertension: NSAIDS.     Weight trend: fluctuating a bit Wt Readings from Last 3 Encounters:  08/16/18 215 lb (97.5 kg)  08/14/18 217 lb (98.4 kg)  06/16/18 220 lb (99.8 kg)    Current diet: in general, a "healthy" diet    ------------------------------------------------------------------------ Insomnia: Patient was last seen for this problem 8 months ago and no changes were made. Patient reports this problem is stable. Is taking mirtazapine prescribed by his.   Depression: Patient was last seen for this problem 8 months ago and no changes were made.  Patient reports this problem is well controlled. He had been seeing Dr. Clovis Pu at [facility], but he has been doing well and states his psychiatrist advised him he could start having meds refilled by  PCP   Back pain: Patient was last seen for this problem 2 months ago. Patient was treated with Flexeril and Prednisone taper. He was also prescribed Hydrocodone and advised to follow up with Dr. Ellene Route as scheduled.  Patient reports this problem has worsened. He states he is scheduled to have back surgery on Monday 08/21/2018 by Dr. Lacinda Axon (with [facility]).    Allergies  Allergen Reactions  . Penicillins Other (See Comments)    Stomach Ache     Current Outpatient Medications:  .  amLODipine (NORVASC) 2.5 MG tablet, TAKE 1 TABLET BY MOUTH ONCE EVERY EVENING, Disp: 90 tablet, Rfl: 4 .  aspirin 81 MG tablet, Take 81 mg by mouth daily. , Disp: , Rfl:  .  atenolol-chlorthalidone (TENORETIC) 50-25 MG tablet, TAKE 1/2 TABLET BY MOUTH ONCE A DAY, Disp: 45 tablet, Rfl: 11 .  butalbital-acetaminophen-caffeine (FIORICET, ESGIC) 50-325-40 MG tablet, TAKE 1 OR 2 TABLETS  BY MOUTH EVERY 6 HOURS AS NEEDED FOR HEADACHE, Disp: 20 tablet, Rfl: 5 .  cyclobenzaprine (FLEXERIL) 5 MG tablet, TAKE 1 TO 2 TABLETS (5-10MG ) BY MOUTH ATBEDTIME AS NEEDED FOR MUSCLE SPASMS (Patient taking differently: Take 5 mg by mouth 3 (three) times daily. ), Disp: 30 tablet, Rfl: 3 .  diclofenac (VOLTAREN) 50 MG EC tablet, TAKE 1 TABLET BY MOUTH TWICE A DAY, Disp: 180 tablet, Rfl: 4 .  finasteride (PROSCAR) 5 MG tablet, Take 1 tablet (5 mg total) by mouth daily., Disp: 90 tablet, Rfl: 3 .  gabapentin (NEURONTIN) 300 MG capsule, Take by mouth., Disp: , Rfl:  .  HYDROcodone-acetaminophen (NORCO) 10-325 MG tablet, Take 1 tablet by mouth every 6 (six) hours as needed. , Disp: , Rfl:  .  mirtazapine (REMERON) 30 MG tablet, Take 30 mg by mouth at bedtime. , Disp: , Rfl:  .  tamsulosin (FLOMAX) 0.4 MG CAPS capsule, Take 1 capsule (0.4 mg total) by mouth daily., Disp: 90 capsule, Rfl: 3 .  valACYclovir (VALTREX) 500 MG tablet, TAKE 1 TABLET BY MOUTH 2 TIMES DAILY AS NEEDED (Patient taking differently: Take 500 mg by mouth 2 (two) times  daily as needed (shingles). ), Disp: 20 tablet, Rfl: 5  Review of Systems  Constitutional: Negative for appetite change, chills and fever.  Respiratory: Negative for chest tightness, shortness of breath and wheezing.   Cardiovascular: Negative for chest pain and palpitations.  Gastrointestinal: Negative for abdominal pain, nausea and vomiting.  Musculoskeletal: Positive for back pain.    Social History   Tobacco Use  . Smoking status: Never Smoker  . Smokeless tobacco: Never Used  Substance Use Topics  . Alcohol use: No    Alcohol/week: 0.0 standard drinks   Objective:   BP 122/82 (BP Location: Left Arm, Patient Position: Sitting, Cuff Size: Large)   Pulse 68   Temp 97.7 F (36.5 C) (Oral)   Resp 16   Wt 215 lb (97.5 kg)   SpO2 98% Comment: room air  BMI 26.17 kg/m     Physical Exam  General appearance: alert, well developed, well nourished, cooperative and in no distress Head: Normocephalic, without obvious abnormality, atraumatic Respiratory: Respirations even and unlabored, normal respiratory rate Extremities: No gross deformities Skin: Skin color, texture, turgor normal. No rashes seen  Psych: Appropriate mood and affect. Neurologic: Mental status: Alert, oriented to person, place, and time, thought content appropriate.     Assessment & Plan:     1. Essential (primary) hypertension Well controlled.  Continue current medications.    2. Lumbosacral neuritis Anticipating surgery by Dr. Lacinda Axon next week.   3. Insomnia, unspecified type Doing well with mirtazapine.   4. Depression, unspecified depression type Has been followed by psychiatry at [facility], but doing well on mirtazapine and agreed to refill so long as symptoms remain well controlled.   5. Need for influenza vaccination  - Flu vaccine HIGH DOSE PF (Fluzone High dose)       Lelon Huh, MD  [facility]

## 2018-08-21 ENCOUNTER — Observation Stay
Admission: RE | Admit: 2018-08-21 | Discharge: 2018-08-22 | Disposition: A | Discharge status: Home / Self Care | Payer: Medicare Other | Source: Ambulatory Visit | Attending: Neurosurgery | Admitting: Neurosurgery

## 2018-08-21 ENCOUNTER — Ambulatory Visit: Payer: Medicare Other | Admitting: Certified Registered"

## 2018-08-21 ENCOUNTER — Other Ambulatory Visit: Payer: Self-pay

## 2018-08-21 ENCOUNTER — Encounter
Admission: RE | Disposition: A | Discharge status: Home / Self Care | Payer: Self-pay | Source: Ambulatory Visit | Attending: Neurosurgery

## 2018-08-21 ENCOUNTER — Encounter: Payer: Self-pay | Admitting: *Deleted

## 2018-08-21 ENCOUNTER — Ambulatory Visit: Payer: Medicare Other

## 2018-08-21 DIAGNOSIS — Z85828 Personal history of other malignant neoplasm of skin: Secondary | ICD-10-CM | POA: Diagnosis not present

## 2018-08-21 DIAGNOSIS — Z79899 Other long term (current) drug therapy: Secondary | ICD-10-CM | POA: Diagnosis not present

## 2018-08-21 DIAGNOSIS — Z8614 Personal history of Methicillin resistant Staphylococcus aureus infection: Secondary | ICD-10-CM | POA: Insufficient documentation

## 2018-08-21 DIAGNOSIS — F418 Other specified anxiety disorders: Secondary | ICD-10-CM | POA: Diagnosis not present

## 2018-08-21 DIAGNOSIS — Z981 Arthrodesis status: Secondary | ICD-10-CM | POA: Diagnosis not present

## 2018-08-21 DIAGNOSIS — I10 Essential (primary) hypertension: Secondary | ICD-10-CM | POA: Insufficient documentation

## 2018-08-21 DIAGNOSIS — Z87442 Personal history of urinary calculi: Secondary | ICD-10-CM | POA: Diagnosis not present

## 2018-08-21 DIAGNOSIS — M48061 Spinal stenosis, lumbar region without neurogenic claudication: Secondary | ICD-10-CM | POA: Diagnosis not present

## 2018-08-21 DIAGNOSIS — N400 Enlarged prostate without lower urinary tract symptoms: Secondary | ICD-10-CM | POA: Insufficient documentation

## 2018-08-21 DIAGNOSIS — M1990 Unspecified osteoarthritis, unspecified site: Secondary | ICD-10-CM | POA: Diagnosis not present

## 2018-08-21 DIAGNOSIS — Z7982 Long term (current) use of aspirin: Secondary | ICD-10-CM | POA: Insufficient documentation

## 2018-08-21 DIAGNOSIS — F329 Major depressive disorder, single episode, unspecified: Secondary | ICD-10-CM | POA: Diagnosis not present

## 2018-08-21 DIAGNOSIS — G4700 Insomnia, unspecified: Secondary | ICD-10-CM | POA: Diagnosis not present

## 2018-08-21 DIAGNOSIS — G9519 Other vascular myelopathies: Secondary | ICD-10-CM | POA: Diagnosis present

## 2018-08-21 DIAGNOSIS — M48062 Spinal stenosis, lumbar region with neurogenic claudication: Secondary | ICD-10-CM | POA: Diagnosis not present

## 2018-08-21 DIAGNOSIS — Z419 Encounter for procedure for purposes other than remedying health state, unspecified: Secondary | ICD-10-CM

## 2018-08-21 DIAGNOSIS — R29818 Other symptoms and signs involving the nervous system: Secondary | ICD-10-CM | POA: Diagnosis present

## 2018-08-21 HISTORY — PX: LUMBAR LAMINECTOMY/DECOMPRESSION MICRODISCECTOMY: SHX5026

## 2018-08-21 LAB — ABO/RH: ABO/RH(D): O POS

## 2018-08-21 SURGERY — LUMBAR LAMINECTOMY/DECOMPRESSION MICRODISCECTOMY 1 LEVEL
Anesthesia: General

## 2018-08-21 MED ORDER — SENNOSIDES-DOCUSATE SODIUM 8.6-50 MG PO TABS: 1.0000 | ORAL_TABLET | Freq: Every evening | ORAL | Status: DC | PRN

## 2018-08-21 MED ORDER — MIDAZOLAM HCL 2 MG/2ML IJ SOLN: INTRAMUSCULAR | Status: DC | PRN

## 2018-08-21 MED ORDER — SUGAMMADEX SODIUM 200 MG/2ML IV SOLN
INTRAVENOUS | Status: DC | PRN
  Administered 2018-08-21: 200 mg via INTRAVENOUS

## 2018-08-21 MED ORDER — FAMOTIDINE 20 MG PO TABS
20.0000 mg | ORAL_TABLET | Freq: Once | ORAL | Status: AC
  Administered 2018-08-21: 20 mg via ORAL

## 2018-08-21 MED ORDER — SODIUM CHLORIDE 0.9 % IV SOLN
INTRAVENOUS | Status: DC
  Administered 2018-08-21 – 2018-08-22 (×2): via INTRAVENOUS

## 2018-08-21 MED ORDER — VASOPRESSIN 20 UNIT/ML IV SOLN
INTRAVENOUS | Status: DC | PRN
  Administered 2018-08-21: 2 [IU] via INTRAVENOUS

## 2018-08-21 MED ORDER — OXYCODONE HCL 5 MG PO TABS
10.0000 mg | ORAL_TABLET | ORAL | Status: DC | PRN
  Administered 2018-08-21: 10 mg via ORAL
  Filled 2018-08-21: qty 2

## 2018-08-21 MED ORDER — CLINDAMYCIN PHOSPHATE 900 MG/50ML IV SOLN
900.0000 mg | Freq: Once | INTRAVENOUS | Status: AC
  Administered 2018-08-21: 900 mg via INTRAVENOUS

## 2018-08-21 MED ORDER — ATENOLOL 25 MG PO TABS
50.0000 mg | ORAL_TABLET | Freq: Every day | ORAL | Status: DC
  Administered 2018-08-21: 50 mg via ORAL
  Filled 2018-08-21 (×2): qty 2

## 2018-08-21 MED ORDER — DEXAMETHASONE SODIUM PHOSPHATE 10 MG/ML IJ SOLN
INTRAMUSCULAR | Status: DC | PRN
  Administered 2018-08-21: 8 mg via INTRAVENOUS

## 2018-08-21 MED ORDER — LIDOCAINE-EPINEPHRINE (PF) 1 %-1:200000 IJ SOLN
INTRAMUSCULAR | Status: DC | PRN
  Administered 2018-08-21: 8 mL

## 2018-08-21 MED ORDER — GABAPENTIN 300 MG PO CAPS
300.0000 mg | ORAL_CAPSULE | Freq: Three times a day (TID) | ORAL | Status: DC
  Administered 2018-08-21 – 2018-08-22 (×3): 300 mg via ORAL
  Filled 2018-08-21 (×3): qty 1

## 2018-08-21 MED ORDER — SODIUM CHLORIDE 0.9 % IV SOLN: 250.0000 mL | INTRAVENOUS | Status: DC

## 2018-08-21 MED ORDER — SENNOSIDES-DOCUSATE SODIUM 8.6-50 MG PO TABS
1.0000 | ORAL_TABLET | Freq: Two times a day (BID) | ORAL | Status: DC
  Administered 2018-08-21 – 2018-08-22 (×2): 1 via ORAL
  Filled 2018-08-21 (×2): qty 1

## 2018-08-21 MED ORDER — MIRTAZAPINE 15 MG PO TABS
30.0000 mg | ORAL_TABLET | Freq: Every day | ORAL | Status: DC
  Administered 2018-08-21: 30 mg via ORAL
  Filled 2018-08-21: qty 2

## 2018-08-21 MED ORDER — THROMBIN 5000 UNITS EX SOLR
CUTANEOUS | Status: AC
  Filled 2018-08-21: qty 5000

## 2018-08-21 MED ORDER — ACETAMINOPHEN 325 MG PO TABS: 650.0000 mg | ORAL_TABLET | ORAL | Status: DC | PRN

## 2018-08-21 MED ORDER — EPHEDRINE SULFATE 50 MG/ML IJ SOLN
INTRAMUSCULAR | Status: DC | PRN
  Administered 2018-08-21: 10 mg via INTRAVENOUS
  Administered 2018-08-21: 5 mg via INTRAVENOUS

## 2018-08-21 MED ORDER — ROCURONIUM BROMIDE 100 MG/10ML IV SOLN
INTRAVENOUS | Status: DC | PRN
  Administered 2018-08-21: 30 mg via INTRAVENOUS
  Administered 2018-08-21: 5 mg via INTRAVENOUS

## 2018-08-21 MED ORDER — METHOCARBAMOL 1000 MG/10ML IJ SOLN
500.0000 mg | Freq: Four times a day (QID) | INTRAVENOUS | Status: DC | PRN
  Filled 2018-08-21: qty 5

## 2018-08-21 MED ORDER — SODIUM CHLORIDE 0.9 % IR SOLN
Status: DC | PRN
  Administered 2018-08-21: 08:00:00

## 2018-08-21 MED ORDER — ONDANSETRON HCL 4 MG PO TABS: 4.0000 mg | ORAL_TABLET | Freq: Four times a day (QID) | ORAL | Status: DC | PRN

## 2018-08-21 MED ORDER — FINASTERIDE 5 MG PO TABS
5.0000 mg | ORAL_TABLET | Freq: Every day | ORAL | Status: DC
  Administered 2018-08-22: 5 mg via ORAL
  Filled 2018-08-21: qty 1

## 2018-08-21 MED ORDER — ROCURONIUM BROMIDE 50 MG/5ML IV SOLN
INTRAVENOUS | Status: AC
  Filled 2018-08-21: qty 1

## 2018-08-21 MED ORDER — LIDOCAINE HCL (PF) 2 % IJ SOLN
INTRAMUSCULAR | Status: AC
  Filled 2018-08-21: qty 10

## 2018-08-21 MED ORDER — PROPOFOL 10 MG/ML IV BOLUS
INTRAVENOUS | Status: DC | PRN
  Administered 2018-08-21: 140 mg via INTRAVENOUS

## 2018-08-21 MED ORDER — BACITRACIN 50000 UNITS IM SOLR
INTRAMUSCULAR | Status: AC
  Filled 2018-08-21: qty 1

## 2018-08-21 MED ORDER — SUGAMMADEX SODIUM 200 MG/2ML IV SOLN
INTRAVENOUS | Status: AC
  Filled 2018-08-21: qty 2

## 2018-08-21 MED ORDER — AMLODIPINE BESYLATE 5 MG PO TABS
2.5000 mg | ORAL_TABLET | Freq: Every day | ORAL | Status: DC
  Administered 2018-08-21 – 2018-08-22 (×2): 2.5 mg via ORAL
  Filled 2018-08-21 (×2): qty 1

## 2018-08-21 MED ORDER — LIDOCAINE HCL 4 % MT SOLN
OROMUCOSAL | Status: DC | PRN
  Administered 2018-08-21: 4 mL via TOPICAL

## 2018-08-21 MED ORDER — MENTHOL 3 MG MT LOZG: 1.0000 | LOZENGE | OROMUCOSAL | Status: DC | PRN

## 2018-08-21 MED ORDER — PROPOFOL 10 MG/ML IV BOLUS
INTRAVENOUS | Status: AC
  Filled 2018-08-21: qty 20

## 2018-08-21 MED ORDER — LACTATED RINGERS IV SOLN
INTRAVENOUS | Status: DC
  Administered 2018-08-21: 07:00:00 via INTRAVENOUS

## 2018-08-21 MED ORDER — SODIUM CHLORIDE FLUSH 0.9 % IV SOLN
INTRAVENOUS | Status: AC
  Filled 2018-08-21: qty 30

## 2018-08-21 MED ORDER — ONDANSETRON HCL 4 MG/2ML IJ SOLN: 4.0000 mg | Freq: Four times a day (QID) | INTRAMUSCULAR | Status: DC | PRN

## 2018-08-21 MED ORDER — ONDANSETRON HCL 4 MG/2ML IJ SOLN
INTRAMUSCULAR | Status: DC | PRN
  Administered 2018-08-21: 4 mg via INTRAVENOUS

## 2018-08-21 MED ORDER — SODIUM CHLORIDE 0.9% FLUSH: 3.0000 mL | Freq: Two times a day (BID) | INTRAVENOUS | Status: DC

## 2018-08-21 MED ORDER — THROMBIN 5000 UNITS EX SOLR
CUTANEOUS | Status: DC | PRN
  Administered 2018-08-21: 5000 [IU] via TOPICAL

## 2018-08-21 MED ORDER — VALACYCLOVIR HCL 500 MG PO TABS
500.0000 mg | ORAL_TABLET | Freq: Two times a day (BID) | ORAL | Status: DC | PRN
  Filled 2018-08-21: qty 1

## 2018-08-21 MED ORDER — TAMSULOSIN HCL 0.4 MG PO CAPS
0.4000 mg | ORAL_CAPSULE | Freq: Every day | ORAL | Status: DC
  Administered 2018-08-22: 0.4 mg via ORAL
  Filled 2018-08-21: qty 1

## 2018-08-21 MED ORDER — SUCCINYLCHOLINE CHLORIDE 20 MG/ML IJ SOLN
INTRAMUSCULAR | Status: DC | PRN
  Administered 2018-08-21: 100 mg via INTRAVENOUS

## 2018-08-21 MED ORDER — FAMOTIDINE 20 MG PO TABS
ORAL_TABLET | ORAL | Status: AC
  Filled 2018-08-21: qty 1

## 2018-08-21 MED ORDER — OXYCODONE HCL 5 MG PO TABS
5.0000 mg | ORAL_TABLET | ORAL | Status: DC | PRN
  Administered 2018-08-21 – 2018-08-22 (×2): 5 mg via ORAL
  Filled 2018-08-21 (×2): qty 1

## 2018-08-21 MED ORDER — METHOCARBAMOL 500 MG PO TABS
500.0000 mg | ORAL_TABLET | Freq: Four times a day (QID) | ORAL | Status: DC | PRN
  Administered 2018-08-21 (×2): 500 mg via ORAL
  Filled 2018-08-21 (×2): qty 1

## 2018-08-21 MED ORDER — LIDOCAINE-EPINEPHRINE (PF) 1 %-1:200000 IJ SOLN
INTRAMUSCULAR | Status: AC
  Filled 2018-08-21: qty 30

## 2018-08-21 MED ORDER — FENTANYL CITRATE (PF) 250 MCG/5ML IJ SOLN
INTRAMUSCULAR | Status: AC
  Filled 2018-08-21: qty 5

## 2018-08-21 MED ORDER — FENTANYL CITRATE (PF) 100 MCG/2ML IJ SOLN
INTRAMUSCULAR | Status: DC | PRN
  Administered 2018-08-21: 150 ug via INTRAVENOUS

## 2018-08-21 MED ORDER — ATENOLOL-CHLORTHALIDONE 50-25 MG PO TABS: 1.0000 | ORAL_TABLET | Freq: Every day | ORAL | Status: DC

## 2018-08-21 MED ORDER — ACETAMINOPHEN 650 MG RE SUPP: 650.0000 mg | RECTAL | Status: DC | PRN

## 2018-08-21 MED ORDER — ACETAMINOPHEN 500 MG PO TABS
1000.0000 mg | ORAL_TABLET | Freq: Four times a day (QID) | ORAL | Status: AC
  Administered 2018-08-21 – 2018-08-22 (×4): 1000 mg via ORAL
  Filled 2018-08-21 (×4): qty 2

## 2018-08-21 MED ORDER — METHYLPREDNISOLONE ACETATE 40 MG/ML IJ SUSP
INTRAMUSCULAR | Status: AC
  Filled 2018-08-21: qty 1

## 2018-08-21 MED ORDER — LIDOCAINE HCL (CARDIAC) PF 100 MG/5ML IV SOSY
PREFILLED_SYRINGE | INTRAVENOUS | Status: DC | PRN
  Administered 2018-08-21: 50 mg via INTRAVENOUS

## 2018-08-21 MED ORDER — PHENOL 1.4 % MT LIQD: 1.0000 | OROMUCOSAL | Status: DC | PRN

## 2018-08-21 MED ORDER — HYDROCHLOROTHIAZIDE 25 MG PO TABS
25.0000 mg | ORAL_TABLET | Freq: Every day | ORAL | Status: DC
  Administered 2018-08-21 – 2018-08-22 (×2): 25 mg via ORAL
  Filled 2018-08-21 (×2): qty 1

## 2018-08-21 MED ORDER — PHENYLEPHRINE HCL 10 MG/ML IJ SOLN
INTRAMUSCULAR | Status: AC
  Filled 2018-08-21: qty 1

## 2018-08-21 MED ORDER — ONDANSETRON HCL 4 MG/2ML IJ SOLN
INTRAMUSCULAR | Status: AC
  Filled 2018-08-21: qty 2

## 2018-08-21 MED ORDER — BUTALBITAL-APAP-CAFFEINE 50-325-40 MG PO TABS
1.0000 | ORAL_TABLET | Freq: Four times a day (QID) | ORAL | Status: DC | PRN
  Administered 2018-08-21: 1 via ORAL
  Filled 2018-08-21 (×3): qty 1

## 2018-08-21 MED ORDER — ONDANSETRON HCL 4 MG/2ML IJ SOLN: 4.0000 mg | Freq: Once | INTRAMUSCULAR | Status: DC | PRN

## 2018-08-21 MED ORDER — SODIUM CHLORIDE 0.9% FLUSH: 3.0000 mL | INTRAVENOUS | Status: DC | PRN

## 2018-08-21 MED ORDER — CLINDAMYCIN PHOSPHATE 900 MG/50ML IV SOLN
INTRAVENOUS | Status: AC
  Filled 2018-08-21: qty 50

## 2018-08-21 MED ORDER — DEXAMETHASONE SODIUM PHOSPHATE 10 MG/ML IJ SOLN
INTRAMUSCULAR | Status: AC
  Filled 2018-08-21: qty 1

## 2018-08-21 MED ORDER — FENTANYL CITRATE (PF) 100 MCG/2ML IJ SOLN: 25.0000 ug | INTRAMUSCULAR | Status: DC | PRN

## 2018-08-21 MED ORDER — BUPIVACAINE HCL (PF) 0.5 % IJ SOLN
INTRAMUSCULAR | Status: AC
  Filled 2018-08-21: qty 30

## 2018-08-21 MED ORDER — PHENYLEPHRINE HCL 10 MG/ML IJ SOLN
INTRAMUSCULAR | Status: DC | PRN
  Administered 2018-08-21 (×5): 100 ug via INTRAVENOUS

## 2018-08-21 MED ORDER — SODIUM CHLORIDE 0.9 % IV SOLN
INTRAVENOUS | Status: DC | PRN
  Administered 2018-08-21: 20 ug/min via INTRAVENOUS

## 2018-08-21 MED ORDER — SUCCINYLCHOLINE CHLORIDE 20 MG/ML IJ SOLN
INTRAMUSCULAR | Status: AC
  Filled 2018-08-21: qty 1

## 2018-08-21 MED ORDER — HYDROMORPHONE HCL 1 MG/ML IJ SOLN: 0.5000 mg | INTRAMUSCULAR | Status: DC | PRN

## 2018-08-21 SURGICAL SUPPLY — 60 items
BLADE CLIPPER SURG (BLADE) ×2 IMPLANT
BUR NEURO DRILL SOFT 3.0X3.8M (BURR) ×2 IMPLANT
CANISTER SUCT 1200ML W/VALVE (MISCELLANEOUS) ×4 IMPLANT
CHLORAPREP W/TINT 26ML (MISCELLANEOUS) ×4 IMPLANT
COUNTER NEEDLE 20/40 LG (NEEDLE) ×2 IMPLANT
COVER LIGHT HANDLE STERIS (MISCELLANEOUS) ×4 IMPLANT
COVER WAND RF STERILE (DRAPES) ×2 IMPLANT
CUP MEDICINE 2OZ PLAST GRAD ST (MISCELLANEOUS) ×2 IMPLANT
DERMABOND ADVANCED (GAUZE/BANDAGES/DRESSINGS) ×1
DERMABOND ADVANCED .7 DNX12 (GAUZE/BANDAGES/DRESSINGS) ×1 IMPLANT
DRAPE C-ARM 42X72 X-RAY (DRAPES) ×4 IMPLANT
DRAPE LAPAROTOMY 100X77 ABD (DRAPES) ×2 IMPLANT
DRAPE MICROSCOPE SPINE 48X150 (DRAPES) IMPLANT
DRAPE SURG 17X11 SM STRL (DRAPES) ×2 IMPLANT
DRSG OPSITE POSTOP 3X4 (GAUZE/BANDAGES/DRESSINGS) ×2 IMPLANT
DRSG TELFA 4X3 1S NADH ST (GAUZE/BANDAGES/DRESSINGS) ×2 IMPLANT
DURASEAL APPLICATOR TIP (TIP) IMPLANT
DURASEAL SPINE SEALANT 3ML (MISCELLANEOUS) IMPLANT
ELECT CAUTERY BLADE TIP 2.5 (TIP) ×4
ELECT EZSTD 165MM 6.5IN (MISCELLANEOUS) ×2
ELECT REM PT RETURN 9FT ADLT (ELECTROSURGICAL) ×2
ELECTRODE CAUTERY BLDE TIP 2.5 (TIP) ×2 IMPLANT
ELECTRODE EZSTD 165MM 6.5IN (MISCELLANEOUS) ×1 IMPLANT
ELECTRODE REM PT RTRN 9FT ADLT (ELECTROSURGICAL) ×1 IMPLANT
GAUZE SPONGE 4X4 12PLY STRL (GAUZE/BANDAGES/DRESSINGS) ×4 IMPLANT
GLOVE BIOGEL PI IND STRL 7.0 (GLOVE) ×1 IMPLANT
GLOVE BIOGEL PI IND STRL 8 (GLOVE) ×1 IMPLANT
GLOVE BIOGEL PI INDICATOR 7.0 (GLOVE) ×1
GLOVE BIOGEL PI INDICATOR 8 (GLOVE) ×1
GLOVE SURG SYN 6.5 ES PF (GLOVE) ×4 IMPLANT
GLOVE SURG SYN 8.0 (GLOVE) ×6 IMPLANT
GOWN STRL REUS W/ TWL XL LVL3 (GOWN DISPOSABLE) ×1 IMPLANT
GOWN STRL REUS W/TWL MED LVL3 (GOWN DISPOSABLE) ×2 IMPLANT
GOWN STRL REUS W/TWL XL LVL3 (GOWN DISPOSABLE) ×1
GRADUATE 1200CC STRL 31836 (MISCELLANEOUS) ×2 IMPLANT
KIT TURNOVER KIT A (KITS) ×2 IMPLANT
KIT WILSON FRAME (KITS) ×2 IMPLANT
KNIFE BAYONET SHORT DISCETOMY (MISCELLANEOUS) IMPLANT
MARKER SKIN DUAL TIP RULER LAB (MISCELLANEOUS) ×4 IMPLANT
NDL SAFETY ECLIPSE 18X1.5 (NEEDLE) ×2 IMPLANT
NEEDLE HYPO 18GX1.5 SHARP (NEEDLE) ×2
NEEDLE HYPO 22GX1.5 SAFETY (NEEDLE) ×2 IMPLANT
NS IRRIG 1000ML POUR BTL (IV SOLUTION) ×2 IMPLANT
PACK LAMINECTOMY NEURO (CUSTOM PROCEDURE TRAY) ×2 IMPLANT
PAD ARMBOARD 7.5X6 YLW CONV (MISCELLANEOUS) ×2 IMPLANT
PENCIL ELECTRO HAND CTR (MISCELLANEOUS) ×2 IMPLANT
SPOGE SURGIFLO 8M (HEMOSTASIS) ×1
SPONGE SURGIFLO 8M (HEMOSTASIS) ×1 IMPLANT
STAPLER SKIN PROX 35W (STAPLE) IMPLANT
SUT NURALON 4 0 TR CR/8 (SUTURE) IMPLANT
SUT POLYSORB 2-0 5X18 GS-10 (SUTURE) ×4 IMPLANT
SUT VIC AB 0 CT1 18XCR BRD 8 (SUTURE) ×1 IMPLANT
SUT VIC AB 0 CT1 27 (SUTURE) ×2
SUT VIC AB 0 CT1 27XCR 8 STRN (SUTURE) ×2 IMPLANT
SUT VIC AB 0 CT1 8-18 (SUTURE) ×1
SYR 10ML LL (SYRINGE) ×4 IMPLANT
SYR 30ML LL (SYRINGE) ×2 IMPLANT
SYR 3ML LL SCALE MARK (SYRINGE) ×2 IMPLANT
TOWEL OR 17X26 4PK STRL BLUE (TOWEL DISPOSABLE) ×8 IMPLANT
TUBING CONNECTING 10 (TUBING) ×2 IMPLANT

## 2018-08-21 NOTE — Discharge Instructions (Signed)

## 2018-08-21 NOTE — Anesthesia Postprocedure Evaluation (Signed)
Anesthesia Post Note  Patient: Brett Bender  Procedure(s) Performed: LUMBAR LAMINECTOMY/DECOMPRESSION MICRODISCECTOMY 1 LEVEL-L3/4 (N/A )  Patient location during evaluation: PACU Anesthesia Type: General Level of consciousness: awake and alert Pain management: pain level controlled Vital Signs Assessment: post-procedure vital signs reviewed and stable Respiratory status: spontaneous breathing, nonlabored ventilation, respiratory function stable and patient connected to nasal cannula oxygen Cardiovascular status: blood pressure returned to baseline and stable Postop Assessment: no apparent nausea or vomiting Anesthetic complications: no     Last Vitals:  Vitals:   08/21/18 1100 08/21/18 1203  BP: (!) 155/84 (!) 154/96  Pulse: 72 71  Resp: 18 18  Temp: 36.5 C 36.5 C  SpO2: 94% 95%    Last Pain:  Vitals:   08/21/18 1203  TempSrc: Oral  PainSc:                  [name]

## 2018-08-21 NOTE — Anesthesia Procedure Notes (Addendum)
Procedure Name: Intubation Performed by: Lance Muss, CRNA Pre-anesthesia Checklist: Patient identified, Patient being monitored, Timeout performed, Emergency Drugs available and Suction available Patient Re-evaluated:Patient Re-evaluated prior to induction Oxygen Delivery Method: Circle system utilized Preoxygenation: Pre-oxygenation with 100% oxygen Induction Type: IV induction Ventilation: Mask ventilation without difficulty Laryngoscope Size: 4 and Mac Grade View: Grade I Tube type: Oral Tube size: 7.5 mm Number of attempts: 1 Airway Equipment and Method: Stylet and LTA kit utilized Placement Confirmation: ETT inserted through vocal cords under direct vision,  positive ETCO2 and breath sounds checked- equal and bilateral Secured at: 23 cm Tube secured with: Tape Dental Injury: Teeth and Oropharynx as per pre-operative assessment

## 2018-08-21 NOTE — Transfer of Care (Signed)
Immediate Anesthesia Transfer of Care Note  Patient: Brett Bender  Procedure(s) Performed: LUMBAR LAMINECTOMY/DECOMPRESSION MICRODISCECTOMY 1 LEVEL-L3/4 (N/A )  Patient Location: PACU  Anesthesia Type:General  Level of Consciousness: drowsy and responds to stimulation  Airway & Oxygen Therapy: Patient Spontanous Breathing and Patient connected to face mask oxygen  Post-op Assessment: Report given to RN and Post -op Vital signs reviewed and stable  Post vital signs: Reviewed and stable  Last Vitals:  Vitals Value Taken Time  BP 139/89 08/21/2018  9:40 AM  Temp    Pulse 80 08/21/2018  9:40 AM  Resp 18 08/21/2018  9:40 AM  SpO2 98 % 08/21/2018  9:40 AM  Vitals shown include unvalidated device data.  Last Pain:  Vitals:   08/21/18 0619  TempSrc: Temporal  PainSc: 8          Complications: No apparent anesthesia complications

## 2018-08-21 NOTE — H&P (Addendum)
Brett Bender is an 72 y.o. male.   Chief Complaint: Leg and back pain HPI: Brett Bender is here for ongoing severe back and leg pain. He states he did undergo an L4-5 fusion approximately 8 or 9 years ago. More recently, he is dealing with more severe pain that occurs in his back and will travel down into his legs. This is worsened with standing and moving and relieved by sitting. He additionally is having some pain in the anterior side of his legs, some numbness and cramping. He is a very active person and this is all interfering with his normal workout regimen. He is undergone a steroid injection and got no relief. He is attempted to use medications for relief but is found no significant benefit. He had an MRI of his lumbar spine which showed stenosis at L3/4 and he has elected to have decompression performed   Past Medical History:  Diagnosis Date  . Arthritis   . Cancer ([place])    Basal cell carcinoma bilateral arms and left shoulder  . Complication of anesthesia    slow to wake up with shoulder surgery  . Depression   . Enlarged prostate   . Herpes simplex   . History of kidney stones   . History of MRSA infection 2004   skin around eye  . Hypertension   . Kidney stone   . Lumbar stenosis   . Migraine headache   . PONV (postoperative nausea and vomiting)   . Tendonitis of ankle, left     Past Surgical History:  Procedure Laterality Date  . APPENDECTOMY    . back injection    . BACK SURGERY    . CYSTOSCOPY/URETEROSCOPY/HOLMIUM LASER/STENT PLACEMENT Left 12/18/2015   Procedure: CYSTOSCOPY/URETEROSCOPY/HOLMIUM LASER LITHOTRIPSY;  Surgeon: Hollice Espy, MD;  Location: [facility] ORS;  Service: Urology;  Laterality: Left;  . LOWER BACK SURGERY  2010   L4-5 fusion  . NECK SURGERY     Upper neck surgery; with wires placed  . SHOULDER SURGERY Right 11/10/2011   arthroscopic surgery . Dr. Tamala Julian, [facility]  . TONSILLECTOMY      Family History  Problem Relation Age of Onset  . Depression Son    . Diabetes Neg Hx   . Cancer Neg Hx   . Heart attack Neg Hx   . Kidney disease Neg Hx   . Prostate cancer Neg Hx   . Kidney cancer Neg Hx   . Bladder Cancer Neg Hx    Social History:  reports that he has never smoked. He has never used smokeless tobacco. He reports that he does not drink alcohol or use drugs.  Allergies:  Allergies  Allergen Reactions  . Penicillins Other (See Comments)    Stomach Ache    Medications Prior to Admission  Medication Sig Dispense Refill  . amLODipine (NORVASC) 2.5 MG tablet TAKE 1 TABLET BY MOUTH ONCE EVERY EVENING 90 tablet 4  . aspirin 81 MG tablet Take 81 mg by mouth daily.     Marland Kitchen atenolol-chlorthalidone (TENORETIC) 50-25 MG tablet TAKE 1/2 TABLET BY MOUTH ONCE A DAY 45 tablet 11  . butalbital-acetaminophen-caffeine (FIORICET, ESGIC) 50-325-40 MG tablet TAKE 1 OR 2 TABLETS  BY MOUTH EVERY 6 HOURS AS NEEDED FOR HEADACHE 20 tablet 5  . cyclobenzaprine (FLEXERIL) 5 MG tablet TAKE 1 TO 2 TABLETS (5-10MG ) BY MOUTH ATBEDTIME AS NEEDED FOR MUSCLE SPASMS (Patient taking differently: Take 5 mg by mouth 3 (three) times daily. ) 30 tablet 3  . diclofenac (VOLTAREN)  50 MG EC tablet TAKE 1 TABLET BY MOUTH TWICE A DAY 180 tablet 4  . finasteride (PROSCAR) 5 MG tablet Take 1 tablet (5 mg total) by mouth daily. 90 tablet 3  . gabapentin (NEURONTIN) 300 MG capsule Take by mouth.    Marland Kitchen HYDROcodone-acetaminophen (NORCO) 10-325 MG tablet Take 1 tablet by mouth every 6 (six) hours as needed.     . mirtazapine (REMERON) 30 MG tablet Take 1 tablet (30 mg total) by mouth at bedtime. 30 tablet 5  . tamsulosin (FLOMAX) 0.4 MG CAPS capsule Take 1 capsule (0.4 mg total) by mouth daily. 90 capsule 3  . valACYclovir (VALTREX) 500 MG tablet TAKE 1 TABLET BY MOUTH 2 TIMES DAILY AS NEEDED (Patient taking differently: Take 500 mg by mouth 2 (two) times daily as needed (shingles). ) 20 tablet 5    No results found for this or any previous visit (from the past 48 hour(s)). No results  found.  ROS General ROS: Negative Psychological ROS: Negative Ophthalmic ROS: Negative ENT ROS: Negative Hematological and Lymphatic ROS: Negative  Endocrine ROS: Negative Respiratory ROS: Negative Cardiovascular ROS: Negative Gastrointestinal ROS: Negative Genito-Urinary ROS: Negative Musculoskeletal ROS: Positive for back pain Neurological ROS: Positive for leg pain Dermatological ROS: Negative   Blood pressure (!) 147/106, pulse 71, temperature 98.1 F (36.7 C), temperature source Temporal, resp. rate 16, height 6\' 4"  (1.93 m), weight 97.5 kg, SpO2 97 %. Physical Exam  General appearance: Alert, cooperative, in no acute distress Head: Normocephalic, atraumatic Eyes: Normal, EOM intact Oropharynx: Moist without lesions Pulm: Clear to auscultation CV: Regular rate Back: No tenderness to palpation, no obvious incision Ext: No edema in LE bilaterally, warm extremities  Neurologic exam:  Mental status: alertness: alert, affect: normal Speech: fluent and clear  Motor:strength symmetric 5/5 bilateral hip flexion, knee extension. He is 5 out of 5 in the right dorsi and plantar flexion and knee flexion. He is 3 out of 5 in left dorsiflexion and 4 out of 5 in plantarflexion and knee flexion Sensory: Creased light touch and bilateral legs in all dermatomes Gait: Severe pain with ambulation, using wheelchair today   Assessment/Plan L3/4 Decompression for symptomatic lumbar stenosis  Deetta Perla, MD 08/21/2018, 6:35 AM   Patient seen day of surgery and ready to proceed

## 2018-08-21 NOTE — Discharge Summary (Signed)
Procedure: L3-4 lumbar decompression Procedure Date: 08/21/2018 Diagnosis: neurogenic claudication  History: Brett Bender is POD1 s/p L3-4 lumbar decompression. Tolerated procedure without complication and is recovering well. He has ambulated with walker and voided without issue. Leg symptoms prior to surgery have resolved. Continues to have back pain but feels that has become less severe and is described as "soreness". Pain currently rated 5/10. No new lower extremity pain/numbness/tingling.   Physical Exam: Vitals:   08/21/18 0619  BP: (!) 147/106  Pulse: 71  Resp: 16  Temp: 98.1 F (36.7 C)  SpO2: 97%    AA Ox3 Skin: Honeycomb dressing clean and dry. Strength:5/5 throughout lower extremities Sensation: intact and symmetric lower extremities  Data:  Recent Labs  Lab 08/14/18 0933  NA 139  K 3.7  CL 98  CO2 33*  BUN 22  CREATININE 0.86  GLUCOSE 110*  CALCIUM 9.6   No results for input(s): AST, ALT, ALKPHOS in the last 168 hours.  Invalid input(s): TBILI   Recent Labs  Lab 08/14/18 0933  WBC 8.1  HGB 17.2  HCT 51.1  PLT 225   Recent Labs  Lab 08/14/18 0933  APTT 29  INR 0.87         Other tests/results: No imaging reviewed  Assessment/Plan:  Brett Bender is POD1 s/p L3-4 lumbar decompression for neurogenic claudication. Symptoms prior to surgery seem resolved. Will continue pain control with oxycodone, robaxin, and tylenol as needed. [facility] already arranged for home PT services. Neuro exam and incision site intact. He will follow up in 2 weeks to monitor progress.    Marin Olp PA-C Department of Neurosurgery

## 2018-08-21 NOTE — Op Note (Signed)
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 presented to the clinic on 9/24 with ongoing bilateral leg numbness and back pain with ambulation. He had a previous fusion at L4/5 years ago. He had failed conservative management including PT, steroid injections, and prescription medications. MRI revealed stenosis at L3/4 above fusion.  Xrays showed no dynamic instability.Lumbar decompression was discussed to relieve symptoms.  Therisks of surgery were explained to include hematoma, infection, damage to nerve roots, CSF leak, weakness, numbness, pain, need for future surgery including fusion, heart attack, and stroke. He elected to proceed with surgery for symptom relief.   Procedure The patient was brought to the OR after informed consent was obtained. He was given general anesthesia and intubated by the anesthesia service. Vascular access lines were placed.The patient was then placed prone on a Wilson frameensuring all pressure points were padded. A time-out was performed per protocol.   The patient was sterilely prepped and draped.The planned incision was found using fluoroscopy and marked and instilled withlocal anesthetic with epinephrine. The skin was opened sharply and the dissection taken to the fascia. This was incised and cautery was used to dissect the subperiosteal plane to expose the spinous processes and lamina of L4- L5.  Retractors were inserted and hemostasis was achieved. X-ray was  used to confirm location.  Next, a matchstickdrill bit was used to remove the inferior L4 lamina and superior L5 lamina to expose the ligament. The ligament was seen to be hypertrophied at this level.  The underlying ligament was freed and removed with combination of rongeurs starting medially to lateral. The nerve roots were followed out the L3/4 and L4/5 foramina and was found to be decompressed. The decompression was carried across the midline and the ligament removed along the right lateral recess.  The dura was seen to expand as this was performed. The blunt tool was attempted to pass out the nerve roots on the right and found to be free as well. The bone edges were smooth and no obvious compression remained.   Once the dura appeared free from all the bone edges and all soft tissue compression was removed, hemostasis was obtained with Floseal and cautery. The wound was irrigated profusely.  The muscle was then closed using 0 vicryl followed by the fascia with 0 vicryl.  Next, multiple subcutaneous and dermal layers were closed with 2-0 vicryl until the epidermis was well approximated. The skin was closed with Dermabond. A dressing was applied.  The patient was returned to supine position and extubated by the anesthesia service. The patient was then taken to the PACU for post-operative care where he was moving extremities symmetrically.   ESTIMATED BLOOD LOSS: 50 cc  SPECIMENS None  IMPLANT None   I performed the case in its entirety with the assistance of Marin Olp, [place],   Deetta Perla, [place]

## 2018-08-21 NOTE — Anesthesia Post-op Follow-up Note (Signed)
Anesthesia QCDR form completed.        

## 2018-08-21 NOTE — Anesthesia Preprocedure Evaluation (Signed)
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:   Pulmonary neg pulmonary ROS, neg shortness of breath,           Cardiovascular Exercise Tolerance: Good hypertension, (-) angina(-) CAD, (-) Past MI, (-) Cardiac Stents, (-) CABG and (-) DOE (-) dysrhythmias (-) Valvular Problems/Murmurs     Neuro/Psych  Headaches, neg Seizures PSYCHIATRIC DISORDERS Anxiety Depression  Neuromuscular disease    GI/Hepatic negative GI ROS, Neg liver ROS,   Endo/Other  negative endocrine ROS  Renal/GU Renal disease (kidney stones)  negative genitourinary   Musculoskeletal negative musculoskeletal ROS (+) Arthritis ,   Abdominal   Peds negative pediatric ROS (+)  Hematology negative hematology ROS (+)   Anesthesia Other Findings Past Medical History:   History of MRSA infection                                    Herpes simplex                                               Hypertension                                                 Cancer ([facility])                                                   Comment:Basal cell carcinoma   Kidney stone                                                 Arthritis                                                    Enlarged prostate                                            Complication of anesthesia                                     Comment:slow to wake up  Past Surgical History:   SHOULDER SURGERY  Right 11/10/2011     Comment:arthroscopic surgery . Dr. Tamala Julian, [place]                                                    Comment:Upper neck surgery   APPENDECTOMY                                                    Reproductive/Obstetrics negative OB ROS                             Anesthesia Physical  Anesthesia Plan  ASA: III  Anesthesia Plan: General   Post-op Pain Management:    Induction: Intravenous  PONV Risk Score and Plan: 3 and Treatment may vary due to age or medical condition, Ondansetron and Dexamethasone  Airway Management Planned: Oral ETT  Additional Equipment:   Intra-op Plan:   Post-operative Plan: Extubation in OR  Informed Consent: I have reviewed the patients History and Physical, chart, labs and discussed the procedure including the risks, benefits and alternatives for the proposed anesthesia with the patient or authorized representative who has indicated his/her understanding and acceptance.   Dental Advisory Given  Plan Discussed with: Anesthesiologist, CRNA and Surgeon  Anesthesia Plan Comments:         Anesthesia Quick Evaluation

## 2018-08-22 ENCOUNTER — Encounter: Payer: Self-pay | Admitting: Neurosurgery

## 2018-08-22 DIAGNOSIS — I10 Essential (primary) hypertension: Secondary | ICD-10-CM | POA: Diagnosis not present

## 2018-08-22 DIAGNOSIS — M48062 Spinal stenosis, lumbar region with neurogenic claudication: Secondary | ICD-10-CM | POA: Diagnosis not present

## 2018-08-22 DIAGNOSIS — Z85828 Personal history of other malignant neoplasm of skin: Secondary | ICD-10-CM | POA: Diagnosis not present

## 2018-08-22 DIAGNOSIS — M1990 Unspecified osteoarthritis, unspecified site: Secondary | ICD-10-CM | POA: Diagnosis not present

## 2018-08-22 DIAGNOSIS — N400 Enlarged prostate without lower urinary tract symptoms: Secondary | ICD-10-CM | POA: Diagnosis not present

## 2018-08-22 DIAGNOSIS — F329 Major depressive disorder, single episode, unspecified: Secondary | ICD-10-CM | POA: Diagnosis not present

## 2018-08-22 MED ORDER — OXYCODONE HCL 5 MG PO TABS: 5.0000 mg | ORAL_TABLET | ORAL | 0 refills | Status: DC | PRN

## 2018-08-22 MED ORDER — METHOCARBAMOL 500 MG PO TABS: 500.0000 mg | ORAL_TABLET | Freq: Four times a day (QID) | ORAL | 0 refills | Status: DC | PRN

## 2018-08-22 NOTE — Progress Notes (Signed)
Physical Therapy Evaluation Patient Details Name: Brett Bender MRN: [id number] DOB: [birth year] Today's Date: 08/22/2018   History of Present Illness  72 yo male admitted for neurogenic claudication, s/p lumbar laminectomy/decompression microdiscectomy 1 level - L3/4. PMH including L4-5 spinal fusion, CA, depression, hx of MRSA, HTN, lumbar stenosis, back surgery, neck surgery, and R shoulder surgery.  Clinical Impression  Pt is a pleasant 72 year old male who was admitted for neurogenic claudication. Pt performs transfers and ambulation with CGA. Pt progressed to supervision with ambulation. Pt requires increased time to perform all tasks/activities. Pt educated on precautions and able to verbalize them following, VC on stairs to bring pt awareness to precautions. Pt demonstrates deficits with mobility. Pt is not at his baseline. Would benefit from skilled PT to address above deficits and promote optimal return to PLOF. This entire session was guided, instructed, and directly supervised by Greggory Stallion, DPT.       Follow Up Recommendations Home health PT    Equipment Recommendations  None recommended by PT    Recommendations for Other Services       Precautions / Restrictions Precautions Precautions: Back;Fall Precaution Booklet Issued: Yes (comment) Precaution Comments: no bending, arching, twisting, lifting Restrictions Weight Bearing Restrictions: No      Mobility  Bed Mobility General bed mobility comments: Received sitting at EOB, did not assess this date.  Transfers Overall transfer level: Needs assistance Equipment used: Rolling walker (2 wheeled) Transfers: Sit to/from Stand Sit to Stand: Min guard         General transfer comment: Pt demonstrates ability to come to stand maintaining precautions. Min VC for hand placement.  Ambulation/Gait Ambulation/Gait assistance: Min guard;Supervision Gait Distance (Feet): 200 Feet Assistive device: Rolling walker (2  wheeled) Gait Pattern/deviations: WFL(Within Functional Limits) Gait velocity: 1.72 ft/sec Gait velocity interpretation: 1.31 - 2.62 ft/sec, indicative of limited community ambulator General Gait Details: Pt amb with normal reciprocal gait pattern, mildly reduced trunk rotation.  Stairs Stairs: Yes Stairs assistance: Min guard Stair Management: One rail Left Number of Stairs: 4 General stair comments: Pt ascends stairs with step-to-pattern, descends with reciprocal pattern. T/o uses BUE on single railing. Maintained precautions.  Wheelchair Mobility    Modified Rankin (Stroke Patients Only)       Balance Overall balance assessment: Needs assistance Sitting-balance support: No upper extremity supported;Feet supported Sitting balance-Leahy Scale: Good     Standing balance support: No upper extremity supported;During functional activity Standing balance-Leahy Scale: Fair                               Pertinent Vitals/Pain Pain Assessment: Faces Pain Score: 6  Faces Pain Scale: Hurts little more Pain Location: surgical site Pain Descriptors / Indicators: Sore Pain Intervention(s): Limited activity within patient's tolerance;Monitored during session    [place] expects to be discharged to:: Private residence Living Arrangements: Spouse/significant other(wife) Available Help at Discharge: Family;Available 24 hours/day Type of Home: House Home Access: Stairs to enter Entrance Stairs-Rails: Left Entrance Stairs-Number of Steps: 6 Home Layout: One level Home Equipment: Walker - 2 wheels;Shower seat - built in;Adaptive equipment;Grab bars - tub/shower      Prior Function Level of Independence: Independent         Comments: Pt indep with mobility (PRN 2WW 2/2 back pain), ADL, IADL, going to the gym 6 days/week     Hand Dominance   Dominant Hand: Right    Extremity/Trunk Assessment  Upper Extremity Assessment Upper Extremity  Assessment: Overall WFL for tasks assessed(BUE 5/5)    Lower Extremity Assessment Lower Extremity Assessment: Overall WFL for tasks assessed(BLE 5/5)    Cervical / Trunk Assessment Cervical / Trunk Assessment: Normal  Communication   Communication: No difficulties  Cognition Arousal/Alertness: Awake/alert Behavior During Therapy: WFL for tasks assessed/performed Overall Cognitive Status: Within Functional Limits for tasks assessed                                        General Comments      Exercises Other Exercises Other Exercises: pt/spouse instructed in compression stocknig mgt, including wear schedule, positioning, and donning/doffing. Pt/spouse verbalized understanding Other Exercises: pt/spouse instructed in home/routines modifications to minimize falls risk at home while recovering Other Exercises: Pt educated on back precautions. Other Exercises: Pt performed standing voiding at toilet, no apparent LOB noted and no UE support required.   Assessment/Plan    PT Assessment Patient needs continued PT services  PT Problem List Decreased mobility;Decreased activity tolerance;Decreased balance;Decreased knowledge of precautions       PT Treatment Interventions DME instruction;Gait training;Stair training;Functional mobility training;Therapeutic activities;Therapeutic exercise;Balance training;Neuromuscular re-education;Patient/family education    PT Goals (Current goals can be found in the Care Plan section)  Acute Rehab PT Goals Patient Stated Goal: to return to PLOF with less pain PT Goal Formulation: With patient Time For Goal Achievement: 09/05/18 Potential to Achieve Goals: Good    Frequency BID   Barriers to discharge        Co-evaluation               AM-PAC PT "6 Clicks" Daily Activity  Outcome Measure Difficulty turning over in bed (including adjusting bedclothes, sheets and blankets)?: A Little Difficulty moving from lying on back  to sitting on the side of the bed? : A Little Difficulty sitting down on and standing up from a chair with arms (e.g., wheelchair, bedside commode, etc,.)?: A Little Help needed moving to and from a bed to chair (including a wheelchair)?: A Little Help needed walking in hospital room?: A Little Help needed climbing 3-5 steps with a railing? : A Little 6 Click Score: 18    End of Session Equipment Utilized During Treatment: Gait belt Activity Tolerance: Patient tolerated treatment well Patient left: in chair;with call bell/phone within reach;with chair alarm set;with family/visitor present Nurse Communication: Mobility status PT Visit Diagnosis: Other abnormalities of gait and mobility (R26.89);Pain Pain - part of body: (Back)    Time: 8270-7867 PT Time Calculation (min) (ACUTE ONLY): 29 min   Charges:              Algis Downs, SPT 08/22/2018, 12:56 PM

## 2018-08-22 NOTE — Evaluation (Signed)
Occupational Therapy Evaluation Patient Details Name: Brett Bender MRN: [id number] DOB: Nov 08, 1946 Today's Date: 08/22/2018    History of Present Illness 72yo male pt POD#1 lumbar laminectomy and decompression. PMHx includes HTN, headaches, anxiety, depression, kidney stones, enlarged prostate, L4-5 fusion (2010), and neck surgery.    Clinical Impression   Pt seen for OT evaluation this date, POD#1 from above surgery. Prior to hospital admission, pt was independent with mobility, ADL, and IADL. No falls in past 12 months. However, has been having increased difficulty with all aspects of mobility and ADL due to back pain, occasionally using a 2WW. Pt lives with spouse in a single family home with 6 steps to enter and L handrail with spouse able to provide 24/7 assist/support as needed for pt. Currently pt is at supervision level with all aspects of mobility using no AD and PRN min assist for LB ADL tasks (max for compression stockings - spouse able to do). Pt/spouse educated in back precautions with handout provided, self care skills, bed mobility and functional transfer training, AE/DME for bathing, dressing, and toileting needs, and home/routines modifications and falls prevention strategies to maximize safety and functional independence while minimizing falls risk and maintaining precautions. Pt demo'd understanding of bed mobility and dressing techniques. Pt/spouse verbalized understanding of all education/training provided. Handout provided to support recall and carry over of learned precautions/techniques for bed mobility, functional transfers, and self care skills. No additional skilled OT needs at this time. Will discharge in house.     Follow Up Recommendations  No OT follow up    Equipment Recommendations  None recommended by OT    Recommendations for Other Services       Precautions / Restrictions Precautions Precautions: Back;Fall Precaution Booklet Issued: Yes  (comment) Precaution Comments: no bending, arching, or twisting Restrictions Weight Bearing Restrictions: No      Mobility Bed Mobility Overal bed mobility: Needs Assistance Bed Mobility: Sidelying to Sit;Sit to Sidelying   Sidelying to sit: Supervision     Sit to sidelying: Supervision General bed mobility comments: pt/spouse instructed in log roll technique to maintain back precautions and pt able to demo understanding with several trials with no increase in pain reported  Transfers Overall transfer level: Needs assistance Equipment used: None Transfers: Sit to/from Stand Sit to Stand: Supervision         General transfer comment: pt/spouse instructed in strategies to improve transfer technique while maintaining back precautions, pt able to return demo    Balance Overall balance assessment: No apparent balance deficits (not formally assessed)                                         ADL either performed or assessed with clinical judgement   ADL Overall ADL's : Modified independent                                       General ADL Comments: Pt/spouse instructed in back precautions and strategies to maintain during bathing, dressing, and toileting tasks. Pt able to return demo proper technique. Handout provided.      Vision Baseline Vision/History: Wears glasses Wears Glasses: At all times Patient Visual Report: No change from baseline       Perception     Praxis      Pertinent Vitals/Pain Pain  Assessment: 0-10 Pain Score: 6  Pain Location: surgical site Pain Descriptors / Indicators: Sore Pain Intervention(s): Limited activity within patient's tolerance;Monitored during session;Repositioned;Premedicated before session     Hand Dominance     Extremity/Trunk Assessment Upper Extremity Assessment Upper Extremity Assessment: Overall WFL for tasks assessed   Lower Extremity Assessment Lower Extremity Assessment: Overall  WFL for tasks assessed   Cervical / Trunk Assessment Cervical / Trunk Assessment: Normal   Communication Communication Communication: No difficulties   Cognition Arousal/Alertness: Awake/alert Behavior During Therapy: WFL for tasks assessed/performed Overall Cognitive Status: Within Functional Limits for tasks assessed                                     General Comments       Exercises Other Exercises Other Exercises: pt/spouse instructed in compression stocknig mgt, including wear schedule, positioning, and donning/doffing. Pt/spouse verbalized understanding Other Exercises: pt/spouse instructed in home/routines modifications to minimize falls risk at home while recovering   Shoulder Instructions      Home Living Family/patient expects to be discharged to:: Private residence Living Arrangements: Spouse/significant other Available Help at Discharge: Family;Available 24 hours/day Type of Home: House Home Access: Stairs to enter [employer] of Steps: 6 Entrance Stairs-Rails: Left Home Layout: One level     Bathroom Shower/Tub: Occupational psychologist: Handicapped height     Home Equipment: Environmental consultant - 2 wheels;Shower seat - built in;Adaptive equipment;Grab bars - Production designer, theatre/television/film: [employer]        Prior Functioning/Environment Level of Independence: Independent        Comments: Pt indep with mobility (PRN 2WW 2/2 back pain), ADL, IADL, going to the gym 6 days/week        OT Problem List:        OT Treatment/Interventions:      OT Goals(Current goals can be found in the care plan section) Acute Rehab OT Goals Patient Stated Goal: to return to PLOF with less pain OT Goal Formulation: All assessment and education complete, DC therapy  OT Frequency:     Barriers to D/C:            Co-evaluation              AM-PAC PT "6 Clicks" Daily Activity     Outcome Measure Help from another person eating meals?:  None Help from another person taking care of personal grooming?: None Help from another person toileting, which includes using toliet, bedpan, or urinal?: None Help from another person bathing (including washing, rinsing, drying)?: A Little Help from another person to put on and taking off regular upper body clothing?: None Help from another person to put on and taking off regular lower body clothing?: None 6 Click Score: 23   End of Session Equipment Utilized During Treatment: Gait belt  Activity Tolerance: Patient tolerated treatment well Patient left: in bed;with call bell/phone within reach;with family/visitor present;Other (comment)(seated EOB with PT in room )  OT Visit Diagnosis: Other abnormalities of gait and mobility (R26.89)                Time: 1443-1540 OT Time Calculation (min): 37 min Charges:  OT General Charges $OT Visit: 1 Visit OT Evaluation $OT Eval Low Complexity: 1 Low OT Treatments $Self Care/Home Management : 23-37 mins  Jeni Salles, MPH, MS, OTR/L ascom [phone] 08/22/18, 10:48 AM

## 2018-08-22 NOTE — Progress Notes (Signed)
Pt ready for discharge home today per MD. Patient assessment unchanged from this morning. Pt met PT/OT goals, has walker at home. Reviewed discharge instructions and prescriptions with pt and his wife; all questions answered and pt verbalized understanding. PIV removed, VSS. Pt assisted to car via NT.  Brett Bender

## 2018-08-23 DIAGNOSIS — R2689 Other abnormalities of gait and mobility: Secondary | ICD-10-CM | POA: Diagnosis not present

## 2018-08-23 DIAGNOSIS — I10 Essential (primary) hypertension: Secondary | ICD-10-CM | POA: Diagnosis not present

## 2018-08-23 DIAGNOSIS — Z4789 Encounter for other orthopedic aftercare: Secondary | ICD-10-CM | POA: Diagnosis not present

## 2018-08-23 DIAGNOSIS — M6281 Muscle weakness (generalized): Secondary | ICD-10-CM | POA: Diagnosis not present

## 2018-08-24 DIAGNOSIS — Z4789 Encounter for other orthopedic aftercare: Secondary | ICD-10-CM | POA: Diagnosis not present

## 2018-08-24 DIAGNOSIS — M6281 Muscle weakness (generalized): Secondary | ICD-10-CM | POA: Diagnosis not present

## 2018-08-24 DIAGNOSIS — I10 Essential (primary) hypertension: Secondary | ICD-10-CM | POA: Diagnosis not present

## 2018-08-24 DIAGNOSIS — R2689 Other abnormalities of gait and mobility: Secondary | ICD-10-CM | POA: Diagnosis not present

## 2018-08-25 DIAGNOSIS — M6281 Muscle weakness (generalized): Secondary | ICD-10-CM | POA: Diagnosis not present

## 2018-08-25 DIAGNOSIS — Z4789 Encounter for other orthopedic aftercare: Secondary | ICD-10-CM | POA: Diagnosis not present

## 2018-08-25 DIAGNOSIS — I10 Essential (primary) hypertension: Secondary | ICD-10-CM | POA: Diagnosis not present

## 2018-08-25 DIAGNOSIS — R2689 Other abnormalities of gait and mobility: Secondary | ICD-10-CM | POA: Diagnosis not present

## 2018-08-28 DIAGNOSIS — M6281 Muscle weakness (generalized): Secondary | ICD-10-CM | POA: Diagnosis not present

## 2018-08-28 DIAGNOSIS — I10 Essential (primary) hypertension: Secondary | ICD-10-CM | POA: Diagnosis not present

## 2018-08-28 DIAGNOSIS — R2689 Other abnormalities of gait and mobility: Secondary | ICD-10-CM | POA: Diagnosis not present

## 2018-08-28 DIAGNOSIS — Z4789 Encounter for other orthopedic aftercare: Secondary | ICD-10-CM | POA: Diagnosis not present

## 2018-08-30 DIAGNOSIS — I10 Essential (primary) hypertension: Secondary | ICD-10-CM | POA: Diagnosis not present

## 2018-08-30 DIAGNOSIS — Z4789 Encounter for other orthopedic aftercare: Secondary | ICD-10-CM | POA: Diagnosis not present

## 2018-08-30 DIAGNOSIS — M6281 Muscle weakness (generalized): Secondary | ICD-10-CM | POA: Diagnosis not present

## 2018-08-30 DIAGNOSIS — R2689 Other abnormalities of gait and mobility: Secondary | ICD-10-CM | POA: Diagnosis not present

## 2018-09-01 DIAGNOSIS — I10 Essential (primary) hypertension: Secondary | ICD-10-CM | POA: Diagnosis not present

## 2018-09-01 DIAGNOSIS — M6281 Muscle weakness (generalized): Secondary | ICD-10-CM | POA: Diagnosis not present

## 2018-09-01 DIAGNOSIS — R2689 Other abnormalities of gait and mobility: Secondary | ICD-10-CM | POA: Diagnosis not present

## 2018-09-01 DIAGNOSIS — Z4789 Encounter for other orthopedic aftercare: Secondary | ICD-10-CM | POA: Diagnosis not present

## 2018-09-20 ENCOUNTER — Ambulatory Visit: Payer: Self-pay | Admitting: Psychiatry

## 2018-09-26 DIAGNOSIS — L570 Actinic keratosis: Secondary | ICD-10-CM | POA: Diagnosis not present

## 2018-10-03 DIAGNOSIS — H2513 Age-related nuclear cataract, bilateral: Secondary | ICD-10-CM | POA: Diagnosis not present

## 2018-10-03 DIAGNOSIS — H40003 Preglaucoma, unspecified, bilateral: Secondary | ICD-10-CM | POA: Diagnosis not present

## 2018-10-26 DIAGNOSIS — L570 Actinic keratosis: Secondary | ICD-10-CM | POA: Diagnosis not present

## 2018-11-29 DIAGNOSIS — L821 Other seborrheic keratosis: Secondary | ICD-10-CM | POA: Diagnosis not present

## 2018-11-29 DIAGNOSIS — C44519 Basal cell carcinoma of skin of other part of trunk: Secondary | ICD-10-CM | POA: Diagnosis not present

## 2018-11-29 DIAGNOSIS — L812 Freckles: Secondary | ICD-10-CM | POA: Diagnosis not present

## 2018-11-29 DIAGNOSIS — L578 Other skin changes due to chronic exposure to nonionizing radiation: Secondary | ICD-10-CM | POA: Diagnosis not present

## 2018-11-29 DIAGNOSIS — D485 Neoplasm of uncertain behavior of skin: Secondary | ICD-10-CM | POA: Diagnosis not present

## 2018-11-29 DIAGNOSIS — L820 Inflamed seborrheic keratosis: Secondary | ICD-10-CM | POA: Diagnosis not present

## 2018-11-29 DIAGNOSIS — D2230 Melanocytic nevi of unspecified part of face: Secondary | ICD-10-CM | POA: Diagnosis not present

## 2018-11-29 DIAGNOSIS — D225 Melanocytic nevi of trunk: Secondary | ICD-10-CM | POA: Diagnosis not present

## 2018-11-29 DIAGNOSIS — D229 Melanocytic nevi, unspecified: Secondary | ICD-10-CM | POA: Diagnosis not present

## 2018-11-29 DIAGNOSIS — L570 Actinic keratosis: Secondary | ICD-10-CM | POA: Diagnosis not present

## 2018-11-29 DIAGNOSIS — Z1283 Encounter for screening for malignant neoplasm of skin: Secondary | ICD-10-CM | POA: Diagnosis not present

## 2018-12-05 DIAGNOSIS — H401131 Primary open-angle glaucoma, bilateral, mild stage: Secondary | ICD-10-CM | POA: Diagnosis not present

## 2018-12-25 ENCOUNTER — Encounter: Payer: Self-pay | Admitting: Family Medicine

## 2018-12-25 ENCOUNTER — Ambulatory Visit (INDEPENDENT_AMBULATORY_CARE_PROVIDER_SITE_OTHER): Payer: Medicare Other | Admitting: Family Medicine

## 2018-12-25 ENCOUNTER — Ambulatory Visit (INDEPENDENT_AMBULATORY_CARE_PROVIDER_SITE_OTHER): Payer: Medicare Other

## 2018-12-25 VITALS — BP 138/76 | HR 55 | Temp 98.1°F | Ht 76.0 in | Wt 230.0 lb

## 2018-12-25 VITALS — BP 138/76 | HR 55 | Temp 98.1°F | Ht 76.0 in | Wt 230.4 lb

## 2018-12-25 DIAGNOSIS — Z0000 Encounter for general adult medical examination without abnormal findings: Secondary | ICD-10-CM

## 2018-12-25 DIAGNOSIS — I10 Essential (primary) hypertension: Secondary | ICD-10-CM | POA: Diagnosis not present

## 2018-12-25 DIAGNOSIS — N401 Enlarged prostate with lower urinary tract symptoms: Secondary | ICD-10-CM | POA: Diagnosis not present

## 2018-12-25 DIAGNOSIS — Z125 Encounter for screening for malignant neoplasm of prostate: Secondary | ICD-10-CM | POA: Diagnosis not present

## 2018-12-25 DIAGNOSIS — R350 Frequency of micturition: Secondary | ICD-10-CM | POA: Diagnosis not present

## 2018-12-25 NOTE — Patient Instructions (Addendum)
[name] , Thank you for taking time to come for your Medicare Wellness Visit. I appreciate your ongoing commitment to your health goals. Please review the following plan we discussed and let me know if I can assist you in the future.   Screening recommendations/referrals: Colonoscopy: Up to date, due 11/2022 Recommended yearly ophthalmology/optometry visit for glaucoma screening and checkup Recommended yearly dental visit for hygiene and checkup  Vaccinations: Influenza vaccine: Up to date Pneumococcal vaccine: Completed series Tdap vaccine: Up to date, due 12/2021 Shingles vaccine: Completed series    Advanced directives: On file.   Conditions/risks identified: Recommend to drink at least 6-8 8oz glasses of water per day.  Next appointment: 10:00 AM today with Dr Caryn Section.   Preventive Care 47 Years and Older, Male Preventive care refers to lifestyle choices and visits with your health care provider that can promote health and wellness. What does preventive care include?  A yearly physical exam. This is also called an annual well check.  Dental exams once or twice a year.  Routine eye exams. Ask your health care provider how often you should have your eyes checked.  Personal lifestyle choices, including:  Daily care of your teeth and gums.  Regular physical activity.  Eating a healthy diet.  Avoiding tobacco and drug use.  Limiting alcohol use.  Practicing safe sex.  Taking low doses of aspirin every day.  Taking vitamin and mineral supplements as recommended by your health care provider. What happens during an annual well check? The services and screenings done by your health care provider during your annual well check will depend on your age, overall health, lifestyle risk factors, and family history of disease. Counseling  Your health care provider may ask you questions about your:  Alcohol use.  Tobacco use.  Drug use.  Emotional well-being.  Home and  relationship well-being.  Sexual activity.  Eating habits.  History of falls.  Memory and ability to understand (cognition).  Work and work Statistician. Screening  You may have the following tests or measurements:  Height, weight, and BMI.  Blood pressure.  Lipid and cholesterol levels. These may be checked every 5 years, or more frequently if you are over 29 years old.  Skin check.  Lung cancer screening. You may have this screening every year starting at age 76 if you have a 30-pack-year history of smoking and currently smoke or have quit within the past 15 years.  Fecal occult blood test (FOBT) of the stool. You may have this test every year starting at age 51.  Flexible sigmoidoscopy or colonoscopy. You may have a sigmoidoscopy every 5 years or a colonoscopy every 10 years starting at age 51.  Prostate cancer screening. Recommendations will vary depending on your family history and other risks.  Hepatitis C blood test.  Hepatitis B blood test.  Sexually transmitted disease (STD) testing.  Diabetes screening. This is done by checking your blood sugar (glucose) after you have not eaten for a while (fasting). You may have this done every 1-3 years.  Abdominal aortic aneurysm (AAA) screening. You may need this if you are a current or former smoker.  Osteoporosis. You may be screened starting at age 4 if you are at high risk. Talk with your health care provider about your test results, treatment options, and if necessary, the need for more tests. Vaccines  Your health care provider may recommend certain vaccines, such as:  Influenza vaccine. This is recommended every year.  Tetanus, diphtheria, and acellular  pertussis (Tdap, Td) vaccine. You may need a Td booster every 10 years.  Zoster vaccine. You may need this after age 18.  Pneumococcal 13-valent conjugate (PCV13) vaccine. One dose is recommended after age 31.  Pneumococcal polysaccharide (PPSV23) vaccine. One  dose is recommended after age 58. Talk to your health care provider about which screenings and vaccines you need and how often you need them. This information is not intended to replace advice given to you by your health care provider. Make sure you discuss any questions you have with your health care provider. Document Released: 11/21/2015 Document Revised: 07/14/2016 Document Reviewed: 08/26/2015 Elsevier Interactive Patient Education  2017 [place] Prevention in the Home Falls can cause injuries. They can happen to people of all ages. There are many things you can do to make your home safe and to help prevent falls. What can I do on the outside of my home?  Regularly fix the edges of walkways and driveways and fix any cracks.  Remove anything that might make you trip as you walk through a door, such as a raised step or threshold.  Trim any bushes or trees on the path to your home.  Use bright outdoor lighting.  Clear any walking paths of anything that might make someone trip, such as rocks or tools.  Regularly check to see if handrails are loose or broken. Make sure that both sides of any steps have handrails.  Any raised decks and porches should have guardrails on the edges.  Have any leaves, snow, or ice cleared regularly.  Use sand or salt on walking paths during winter.  Clean up any spills in your garage right away. This includes oil or grease spills. What can I do in the bathroom?  Use night lights.  Install grab bars by the toilet and in the tub and shower. Do not use towel bars as grab bars.  Use non-skid mats or decals in the tub or shower.  If you need to sit down in the shower, use a plastic, non-slip stool.  Keep the floor dry. Clean up any water that spills on the floor as soon as it happens.  Remove soap buildup in the tub or shower regularly.  Attach bath mats securely with double-sided non-slip rug tape.  Do not have throw rugs and other  things on the floor that can make you trip. What can I do in the bedroom?  Use night lights.  Make sure that you have a light by your bed that is easy to reach.  Do not use any sheets or blankets that are too big for your bed. They should not hang down onto the floor.  Have a firm chair that has side arms. You can use this for support while you get dressed.  Do not have throw rugs and other things on the floor that can make you trip. What can I do in the kitchen?  Clean up any spills right away.  Avoid walking on wet floors.  Keep items that you use a lot in easy-to-reach places.  If you need to reach something above you, use a strong step stool that has a grab bar.  Keep electrical cords out of the way.  Do not use floor polish or wax that makes floors slippery. If you must use wax, use non-skid floor wax.  Do not have throw rugs and other things on the floor that can make you trip. What can I do with my stairs?  Do not leave any items on the stairs.  Make sure that there are handrails on both sides of the stairs and use them. Fix handrails that are broken or loose. Make sure that handrails are as long as the stairways.  Check any carpeting to make sure that it is firmly attached to the stairs. Fix any carpet that is loose or worn.  Avoid having throw rugs at the top or bottom of the stairs. If you do have throw rugs, attach them to the floor with carpet tape.  Make sure that you have a light switch at the top of the stairs and the bottom of the stairs. If you do not have them, ask someone to add them for you. What else can I do to help prevent falls?  Wear shoes that:  Do not have high heels.  Have rubber bottoms.  Are comfortable and fit you well.  Are closed at the toe. Do not wear sandals.  If you use a stepladder:  Make sure that it is fully opened. Do not climb a closed stepladder.  Make sure that both sides of the stepladder are locked into place.  Ask  someone to hold it for you, if possible.  Clearly mark and make sure that you can see:  Any grab bars or handrails.  First and last steps.  Where the edge of each step is.  Use tools that help you move around (mobility aids) if they are needed. These include:  Canes.  Walkers.  Scooters.  Crutches.  Turn on the lights when you go into a dark area. Replace any light bulbs as soon as they burn out.  Set up your furniture so you have a clear path. Avoid moving your furniture around.  If any of your floors are uneven, fix them.  If there are any pets around you, be aware of where they are.  Review your medicines with your doctor. Some medicines can make you feel dizzy. This can increase your chance of falling. Ask your doctor what other things that you can do to help prevent falls. This information is not intended to replace advice given to you by your health care provider. Make sure you discuss any questions you have with your health care provider. Document Released: 08/21/2009 Document Revised: 04/01/2016 Document Reviewed: 11/29/2014 Elsevier Interactive Patient Education  2017 [employer].

## 2018-12-25 NOTE — Patient Instructions (Signed)
.   Please review the attached list of medications and notify my office if there are any errors.   . Please bring all of your medications to every appointment so we can make sure that our medication list is the same as yours.   

## 2018-12-25 NOTE — Progress Notes (Signed)
Subjective:   Brett Bender is a 73 y.o. male who presents for Medicare Annual/Subsequent preventive examination.  Review of Systems:  N/A  Cardiac Risk Factors include: advanced age (>34men, >82 women);male gender;hypertension     Objective:    Vitals: BP 138/76 (BP Location: Right Arm)   Pulse (!) 55   Temp 98.1 F (36.7 C) (Oral)   Ht 6\' 4"  (1.93 m)   Wt 230 lb 6.4 oz (104.5 kg)   BMI 28.05 kg/m   Body mass index is 28.05 kg/m.  Advanced Directives 12/25/2018 08/21/2018 08/14/2018 12/20/2017 12/17/2016 12/17/2015 11/19/2015  Does Patient Have a Medical Advance Directive? Yes Yes Yes Yes Yes Yes No  Type of Paramedic of [place];Living will Living will Living will Living will Living will Living will -  Does patient want to make changes to medical advance directive? - No - Patient declined No - Patient declined - - - -  Copy of [place] in Chart? Yes - validated most recent copy scanned in chart (See row information) - - - - - -  Would patient like information on creating a medical advance directive? - - - - - - No - patient declined information    Tobacco Social History   Tobacco Use  Smoking Status Never Smoker  Smokeless Tobacco Never Used     Counseling given: Not Answered   Clinical Intake:  Pre-visit preparation completed: Yes  Pain : No/denies pain Pain Score: 0-No pain     Nutritional Status: BMI 25 -29 Overweight Nutritional Risks: None Diabetes: No  How often do you need to have someone help you when you read instructions, pamphlets, or other written materials from your doctor or pharmacy?: 1 - Never  Interpreter Needed?: No  Information entered by :: [facility], LPN  Past Medical History:  Diagnosis Date  . Arthritis   . Cancer ([place])    Basal cell carcinoma bilateral arms and left shoulder  . Complication of anesthesia    slow to wake up with shoulder surgery  . Depression   . Enlarged prostate   .  Herpes simplex   . History of kidney stones   . History of MRSA infection 2004   skin around eye  . Hypertension   . Kidney stone   . Lumbar stenosis   . Migraine headache   . PONV (postoperative nausea and vomiting)   . Tendonitis of ankle, left    Past Surgical History:  Procedure Laterality Date  . APPENDECTOMY    . back injection    . BACK SURGERY    . CYSTOSCOPY/URETEROSCOPY/HOLMIUM LASER/STENT PLACEMENT Left 12/18/2015   Procedure: CYSTOSCOPY/URETEROSCOPY/HOLMIUM LASER LITHOTRIPSY;  Surgeon: Hollice Espy, MD;  Location: [facility] ORS;  Service: Urology;  Laterality: Left;  . LOWER BACK SURGERY  2010   L4-5 fusion  . LUMBAR LAMINECTOMY/DECOMPRESSION MICRODISCECTOMY N/A 08/21/2018   Procedure: LUMBAR LAMINECTOMY/DECOMPRESSION MICRODISCECTOMY 1 LEVEL-L3/4;  Surgeon: Deetta Perla, MD;  Location: [facility] ORS;  Service: Neurosurgery;  Laterality: N/A;  . NECK SURGERY     Upper neck surgery; with wires placed  . SHOULDER SURGERY Right 11/10/2011   arthroscopic surgery . Dr. Tamala Julian, [facility]  . TONSILLECTOMY     Family History  Problem Relation Age of Onset  . Depression Son   . Diabetes Neg Hx   . Cancer Neg Hx   . Heart attack Neg Hx   . Kidney disease Neg Hx   . Prostate cancer Neg Hx   . Kidney  cancer Neg Hx   . Bladder Cancer Neg Hx    Social History   Socioeconomic History  . Marital status: Married    Spouse name: Not on file  . Number of children: 2  . Years of education: Not on file  . Highest education level: Associate degree: occupational, Hotel manager, or vocational program  Occupational History  . Occupation: Retired    Comment: former Arts administrator  . Financial resource strain: Not hard at all  . Food insecurity:    Worry: Never true    Inability: Never true  . Transportation needs:    Medical: No    Non-medical: No  Tobacco Use  . Smoking status: Never Smoker  . Smokeless tobacco: Never Used  Substance and Sexual Activity  . Alcohol  use: No    Alcohol/week: 0.0 standard drinks  . Drug use: No  . Sexual activity: Yes  Lifestyle  . Physical activity:    Days per week: 6 days    Minutes per session: 90 min  . Stress: Not at all  Relationships  . Social connections:    Talks on phone: Patient refused    Gets together: Patient refused    Attends religious service: Patient refused    Active member of club or organization: Patient refused    Attends meetings of clubs or organizations: Patient refused    Relationship status: Patient refused  Other Topics Concern  . Not on file  Social History Narrative  . Not on file    Outpatient Encounter Medications as of 12/25/2018  Medication Sig  . amLODipine (NORVASC) 2.5 MG tablet TAKE 1 TABLET BY MOUTH ONCE EVERY EVENING  . atenolol-chlorthalidone (TENORETIC) 50-25 MG tablet TAKE 1/2 TABLET BY MOUTH ONCE A DAY  . butalbital-acetaminophen-caffeine (FIORICET, ESGIC) 50-325-40 MG tablet TAKE 1 OR 2 TABLETS  BY MOUTH EVERY 6 HOURS AS NEEDED FOR HEADACHE  . diclofenac (VOLTAREN) 50 MG EC tablet TAKE 1 TABLET BY MOUTH TWICE A DAY  . finasteride (PROSCAR) 5 MG tablet Take 1 tablet (5 mg total) by mouth daily.  Marland Kitchen latanoprost (XALATAN) 0.005 % ophthalmic solution Place 1 drop into both eyes at bedtime.   . methocarbamol (ROBAXIN) 500 MG tablet Take 1 tablet (500 mg total) by mouth every 6 (six) hours as needed for muscle spasms.  . mirtazapine (REMERON) 30 MG tablet Take 1 tablet (30 mg total) by mouth at bedtime.  Marland Kitchen oxyCODONE (ROXICODONE) 5 MG immediate release tablet Take 1 tablet (5 mg total) by mouth every 4 (four) hours as needed for breakthrough pain.  . Probiotic Product (DIGESTIVE ADVANTAGE PO) Take 1 tablet by mouth daily.  . tamsulosin (FLOMAX) 0.4 MG CAPS capsule Take 1 capsule (0.4 mg total) by mouth daily.  Marland Kitchen gabapentin (NEURONTIN) 300 MG capsule Take by mouth.  . valACYclovir (VALTREX) 500 MG tablet TAKE 1 TABLET BY MOUTH 2 TIMES DAILY AS NEEDED (Patient not taking: No  sig reported)   No facility-administered encounter medications on file as of 12/25/2018.     Activities of Daily Living In your present state of health, do you have any difficulty performing the following activities: 12/25/2018 08/21/2018  Hearing? N N  Vision? N N  Difficulty concentrating or making decisions? N N  Walking or climbing stairs? N -  Comment - -  Dressing or bathing? N N  Doing errands, shopping? N -  Preparing Food and eating ? N -  Using the Toilet? N -  In the past  six months, have you accidently leaked urine? N -  Do you have problems with loss of bowel control? N -  Managing your Medications? N -  Managing your Finances? N -  Housekeeping or managing your Housekeeping? N -  Some recent data might be hidden    Patient Care Team: Birdie Sons, MD as PCP - General (Family Medicine) Nori Riis PA-C as Physician Assistant (Urology) Odette Fraction as Consulting Physician (Optometry) Deetta Perla, MD as Consulting Physician (Neurosurgery)   Assessment:   This is a routine wellness examination for Perlie.  Exercise Activities and Dietary recommendations Current Exercise Habits: Structured exercise class, Type of exercise: strength training/weights;walking;treadmill;stretching, Time (Minutes): > 60, Frequency (Times/Week): 6, Weekly Exercise (Minutes/Week): 0, Intensity: Moderate, Exercise limited by: None identified  Goals    . Cut out extra servings     Recommend to cut out extra servings after dinner. Advised if going to have a snack to make it healthy, no sweets.     Marland Kitchen DIET - INCREASE WATER INTAKE     Recommend to drink at least 6-8 8oz glasses of water per day.    . Exercise      Starting 2018, I will continue to exercise 6 days a week for 2 hours.        Fall Risk Fall Risk  12/25/2018 12/20/2017 12/17/2016 11/19/2015  Falls in the past year? 0 No No No   FALL RISK PREVENTION PERTAINING TO THE HOME: Any stairs in or around the home? No    If so, do they handrails? N/A  Home free of loose throw rugs in walkways, pet beds, electrical cords, etc? Yes  Adequate lighting in your home to reduce risk of falls? Yes   ASSISTIVE DEVICES UTILIZED TO PREVENT FALLS:  Life alert? No  Use of a cane, walker or w/c? No  Grab bars in the bathroom? Yes  Shower chair or bench in shower? Yes  Elevated toilet seat or a handicapped toilet? Yes    TIMED UP AND GO:  Was the test performed? No .     Depression Screen PHQ 2/9 Scores 12/25/2018 12/20/2017 12/17/2016 02/13/2016  PHQ - 2 Score 0 0 1 4  PHQ- 9 Score - - - 20    Cognitive Function: Declines today.      6CIT Screen 12/17/2016  What Year? 0 points  What month? 0 points  What time? 0 points  Count back from 20 0 points  Months in reverse 0 points  Repeat phrase 0 points  Total Score 0    Immunization History  Administered Date(s) Administered  . Influenza Split 09/03/2006, 08/12/2007, 07/26/2008, 08/09/2012  . Influenza, High Dose Seasonal PF 08/10/2014, 08/18/2015, 08/10/2016, 07/28/2017, 08/16/2018  . Influenza,inj,Quad PF,6+ Mos 07/31/2013  . Pneumococcal Conjugate-13 11/27/2014  . Pneumococcal Polysaccharide-23 12/22/2011  . Tdap 12/22/2011  . Zoster 11/26/2011  . Zoster Recombinat (Shingrix) 05/25/2017, 07/28/2017    Qualifies for Shingles Vaccine? Up to date  Tdap: Up to date  Flu Vaccine: Up to date  Pneumococcal Vaccine: Up to date   Screening Tests Health Maintenance  Topic Date Due  . TETANUS/TDAP  12/21/2021  . COLONOSCOPY  11/13/2022  . INFLUENZA VACCINE  Completed  . Hepatitis C Screening  Completed  . PNA vac Low Risk Adult  Completed   Cancer Screenings:  Colorectal Screening: Completed 11/13/12. Repeat every 10 years.  Lung Cancer Screening: (Low Dose CT Chest recommended if Age 75-80 years, 30 pack-year currently smoking OR have  quit w/in 15years.) does not qualify.    Additional Screening:  Hepatitis C Screening: Up to  date  Vision Screening: Recommended annual ophthalmology exams for early detection of glaucoma and other disorders of the eye.  Dental Screening: Recommended annual dental exams for proper oral hygiene  Community Resource Referral:  CRR required this visit?  No        Plan:  I have personally reviewed and addressed the Medicare Annual Wellness questionnaire and have noted the following in the patient's chart:  A. Medical and social history B. Use of alcohol, tobacco or illicit drugs  C. Current medications and supplements D. Functional ability and status E.  Nutritional status F.  Physical activity G. Advance directives H. List of other physicians I.  Hospitalizations, surgeries, and ER visits in previous 12 months J.  [place] such as hearing and vision if needed, cognitive and depression L. Referrals and appointments - none  In addition, I have reviewed and discussed with patient certain preventive protocols, quality metrics, and best practice recommendations. A written personalized care plan for preventive services as well as general preventive health recommendations were provided to patient.  See attached scanned questionnaire for additional information.   Signed,  Fabio Neighbors, LPN Nurse Health Advisor   Nurse Recommendations: None.

## 2018-12-25 NOTE — Progress Notes (Signed)
Patient: Brett Bender Male    DOB: 12-Mar-1946   73 y.o.   MRN: [id number] Visit Date: 12/25/2018  Today's Provider: Lelon Huh, MD   Chief Complaint  Patient presents with  . Hypertension   Subjective:     HPI    Hypertension, follow-up:  BP Readings from Last 3 Encounters:  12/25/18 138/76  12/25/18 138/76  08/22/18 136/81    He was last seen for hypertension 4 months ago.  BP at that visit was 136/81. Management since that visit includes No changes. He reports excellent compliance with treatment. He is not having side effects.  He is exercising. He is not adherent to low salt diet.   Outside blood pressures are not being checked. He is experiencing chest pain, chest pressure/discomfort, dyspnea, exertional chest pressure/discomfort, palpitations and syncope.  Patient denies chest pain, chest pressure/discomfort, dyspnea, exertional chest pressure/discomfort, fatigue, lower extremity edema, near-syncope, palpitations and paroxysmal nocturnal dyspnea.   Cardiovascular risk factors include advanced age (older than 17 for men, 25 for women), hypertension and male gender.  Use of agents associated with hypertension: none.     Weight trend: stable Wt Readings from Last 3 Encounters:  12/25/18 230 lb (104.3 kg)  12/25/18 230 lb 6.4 oz (104.5 kg)  08/21/18 214 lb 15.2 oz (97.5 kg)    Current diet: in general, a "healthy" diet    ------------------------------------------------------------------------    Allergies  Allergen Reactions  . Penicillins Other (See Comments)    Stomach Ache     Current Outpatient Medications:  .  amLODipine (NORVASC) 2.5 MG tablet, TAKE 1 TABLET BY MOUTH ONCE EVERY EVENING, Disp: 90 tablet, Rfl: 4 .  atenolol-chlorthalidone (TENORETIC) 50-25 MG tablet, TAKE 1/2 TABLET BY MOUTH ONCE A DAY, Disp: 45 tablet, Rfl: 11 .  butalbital-acetaminophen-caffeine (FIORICET, ESGIC) 50-325-40 MG tablet, TAKE 1 OR 2 TABLETS  BY MOUTH  EVERY 6 HOURS AS NEEDED FOR HEADACHE, Disp: 20 tablet, Rfl: 5 .  diclofenac (VOLTAREN) 50 MG EC tablet, TAKE 1 TABLET BY MOUTH TWICE A DAY, Disp: 180 tablet, Rfl: 4 .  finasteride (PROSCAR) 5 MG tablet, Take 1 tablet (5 mg total) by mouth daily., Disp: 90 tablet, Rfl: 3 .  latanoprost (XALATAN) 0.005 % ophthalmic solution, Place 1 drop into both eyes at bedtime. , Disp: , Rfl:  .  methocarbamol (ROBAXIN) 500 MG tablet, Take 1 tablet (500 mg total) by mouth every 6 (six) hours as needed for muscle spasms., Disp: 60 tablet, Rfl: 0 .  oxyCODONE (ROXICODONE) 5 MG immediate release tablet, Take 1 tablet (5 mg total) by mouth every 4 (four) hours as needed for breakthrough pain., Disp: 30 tablet, Rfl: 0 .  Probiotic Product (DIGESTIVE ADVANTAGE PO), Take 1 tablet by mouth daily., Disp: , Rfl:  .  tamsulosin (FLOMAX) 0.4 MG CAPS capsule, Take 1 capsule (0.4 mg total) by mouth daily., Disp: 90 capsule, Rfl: 3 .  valACYclovir (VALTREX) 500 MG tablet, TAKE 1 TABLET BY MOUTH 2 TIMES DAILY AS NEEDED, Disp: 20 tablet, Rfl: 5 .  mirtazapine (REMERON) 30 MG tablet, Take 1 tablet (30 mg total) by mouth at bedtime., Disp: 30 tablet, Rfl: 5  Review of Systems  HENT: Negative.   Eyes: Negative.   Respiratory: Negative.   Cardiovascular: Negative.   Gastrointestinal: Positive for abdominal distention. Negative for abdominal pain, anal bleeding, blood in stool, constipation, diarrhea, nausea, rectal pain and vomiting.  Endocrine: Positive for polyuria. Negative for cold intolerance, heat intolerance, polydipsia and polyphagia.  Genitourinary: Positive for urgency. Negative for decreased urine volume, difficulty urinating, discharge, dysuria, enuresis, flank pain, frequency, genital sores, hematuria, penile pain, penile swelling, scrotal swelling and testicular pain.  Musculoskeletal: Positive for neck pain and neck stiffness. Negative for arthralgias, back pain, gait problem, joint swelling and myalgias.  Skin:  Negative.   Allergic/Immunologic: Negative.   Neurological: Positive for light-headedness. Negative for dizziness, tremors, seizures, syncope, facial asymmetry, speech difficulty, weakness, numbness and headaches.  Hematological: Negative.   Psychiatric/Behavioral: Negative.     Social History   Tobacco Use  . Smoking status: Never Smoker  . Smokeless tobacco: Never Used  Substance Use Topics  . Alcohol use: No    Alcohol/week: 0.0 standard drinks      Objective:   BP 138/76   Pulse (!) 55   Temp 98.1 F (36.7 C) (Oral)   Ht 6\' 4"  (1.93 m)   Wt 230 lb (104.3 kg)   BMI 28.00 kg/m     Physical Exam   General Appearance:    Alert, cooperative, no distress  Eyes:    PERRL, conjunctiva/corneas clear, EOM's intact       Lungs:     Clear to auscultation bilaterally, respirations unlabored  Heart:    Regular rate and rhythm  Neurologic:   Awake, alert, oriented x 3. No apparent focal neurological           defect.           Assessment & Plan    1. Essential (primary) hypertension Fairly well controlled.  - Comprehensive metabolic panel - Lipid panel  2. Benign prostatic hyperplasia with urinary frequency Stable on current medications. Follow up urology as scheduled.   3. Prostate cancer screening  - PSA     Lelon Huh, MD  [facility]

## 2018-12-26 DIAGNOSIS — Z125 Encounter for screening for malignant neoplasm of prostate: Secondary | ICD-10-CM | POA: Diagnosis not present

## 2018-12-26 DIAGNOSIS — I10 Essential (primary) hypertension: Secondary | ICD-10-CM | POA: Diagnosis not present

## 2018-12-27 LAB — COMPREHENSIVE METABOLIC PANEL
ALBUMIN: 4.5 g/dL (ref 3.7–4.7)
ALT: 30 IU/L (ref 0–44)
AST: 24 IU/L (ref 0–40)
Albumin/Globulin Ratio: 2 (ref 1.2–2.2)
Alkaline Phosphatase: 66 IU/L (ref 39–117)
BUN / CREAT RATIO: 14 (ref 10–24)
BUN: 15 mg/dL (ref 8–27)
Bilirubin Total: 0.8 mg/dL (ref 0.0–1.2)
CALCIUM: 9.7 mg/dL (ref 8.6–10.2)
CO2: 25 mmol/L (ref 20–29)
CREATININE: 1.09 mg/dL (ref 0.76–1.27)
Chloride: 98 mmol/L (ref 96–106)
GFR calc Af Amer: 78 mL/min/{1.73_m2} (ref >59)
GFR, EST NON AFRICAN AMERICAN: 67 mL/min/{1.73_m2} (ref >59)
GLOBULIN, TOTAL: 2.2 g/dL (ref 1.5–4.5)
Glucose: 125 mg/dL — ABNORMAL HIGH (ref 65–99)
Potassium: 4.2 mmol/L (ref 3.5–5.2)
SODIUM: 138 mmol/L (ref 134–144)
Total Protein: 6.7 g/dL (ref 6.0–8.5)

## 2018-12-27 LAB — LIPID PANEL
CHOL/HDL RATIO: 3.3 ratio (ref 0.0–5.0)
Cholesterol, Total: 170 mg/dL (ref 100–199)
HDL: 51 mg/dL (ref >39)
LDL CALC: 90 mg/dL (ref 0–99)
Triglycerides: 143 mg/dL (ref 0–149)
VLDL Cholesterol Cal: 29 mg/dL (ref 5–40)

## 2018-12-27 LAB — PSA: Prostate Specific Ag, Serum: <0.1 ng/mL (ref 0.0–4.0)

## 2018-12-28 ENCOUNTER — Telehealth: Payer: Self-pay | Admitting: Family Medicine

## 2018-12-28 NOTE — Telephone Encounter (Signed)
pt called wanting to know if we have received his lab results back  CB#  [phone]  Thanks Con Memos

## 2018-12-28 NOTE — Telephone Encounter (Signed)
Pt advised of lab results.   Thanks,   -Roselia Snipe  

## 2019-01-05 ENCOUNTER — Other Ambulatory Visit: Payer: Self-pay | Admitting: Family Medicine

## 2019-01-15 DIAGNOSIS — H401131 Primary open-angle glaucoma, bilateral, mild stage: Secondary | ICD-10-CM | POA: Diagnosis not present

## 2019-04-13 ENCOUNTER — Other Ambulatory Visit: Payer: Self-pay | Admitting: *Deleted

## 2019-04-13 DIAGNOSIS — N138 Other obstructive and reflux uropathy: Secondary | ICD-10-CM

## 2019-04-13 NOTE — Progress Notes (Signed)
SA

## 2019-04-16 ENCOUNTER — Ambulatory Visit
Admission: RE | Admit: 2019-04-16 | Discharge: 2019-04-16 | Disposition: A | Discharge status: Home / Self Care | Payer: Medicare Other | Attending: Urology | Admitting: Urology

## 2019-04-16 ENCOUNTER — Other Ambulatory Visit: Payer: Self-pay

## 2019-04-16 ENCOUNTER — Ambulatory Visit
Admission: RE | Admit: 2019-04-16 | Discharge: 2019-04-16 | Disposition: A | Discharge status: Home / Self Care | Payer: Medicare Other | Source: Ambulatory Visit | Attending: Urology | Admitting: Urology

## 2019-04-16 ENCOUNTER — Other Ambulatory Visit: Payer: Medicare Other

## 2019-04-16 DIAGNOSIS — N138 Other obstructive and reflux uropathy: Secondary | ICD-10-CM

## 2019-04-16 DIAGNOSIS — N200 Calculus of kidney: Secondary | ICD-10-CM | POA: Diagnosis not present

## 2019-04-16 DIAGNOSIS — N401 Enlarged prostate with lower urinary tract symptoms: Secondary | ICD-10-CM | POA: Diagnosis not present

## 2019-04-16 DIAGNOSIS — Z87442 Personal history of urinary calculi: Secondary | ICD-10-CM | POA: Insufficient documentation

## 2019-04-17 LAB — PSA: Prostate Specific Ag, Serum: <0.1 ng/mL (ref 0.0–4.0)

## 2019-04-17 NOTE — Progress Notes (Signed)
10:12 AM   Brett Bender [birth year] [id number]  Referring provider: Birdie Sons, MD [address] [place], [place] [address]  Chief Complaint  Patient presents with  . Benign Prostatic Hypertrophy    HPI: Patient is a 73 year old WM who presents today for his one year follow up for nephrolithiasis, frequency and nocturia.    Nephrolithiasis Patient underwent left URS/LL/left ureteral stent placement with subsequent stent removal for a left distal (8mm) stone in 12/2015.  Follow up RUS confirmed resolution of the left hydronephrosis.  Bilateral nephrolithiasis is still present.  Stone composition was calcium oxalate dihydrate 30%, calcium oxalate monohydrate 60% and calcium phosphate 10%.  [facility] results noted he was at increased risk for stone formation due to borderline hyperoxaluria, hypocitraturia, high urine pH and mild hyperurlcosuria.  He was given instruction on dietary changes and encouraged to drink 2.5 L of water daily.  KUB taken on 02/15/2017 noted no nephrolithiasis.   KUB taken on 02/06/2018 noted no evident renal or ureteral calculi by radiography. No bowel obstruction or free air evident.  KUB on 04/16/2019 noted several small calculi in the right kidney. These calculi may have been present previously but stool overlying the right colon on previous study obscures anatomic detail. There is much less stool currently overlying the right kidney.  No bowel obstruction.  No free air evident.  He had an episode of gross hematuria that was associated with dysuria that resolved spontaneously.  His UA today is negative.     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  Denies any dysuria, hematuria or suprapubic pain.   Currently taking: tamsulosin 0.4  mg daily and finasteride 5 mg daily.  He did not find Myrbetriq helpful in alleviating his LU TS.    His has had cystoscopy in 12/2015 was NED.     Denies any recent fevers, chills, nausea or vomiting.   IPSS    Row Name 04/18/19 0900         International Prostate Symptom Score   How often have you had the sensation of not emptying your bladder?  More than half the time     How often have you had to urinate less than every two hours?  More than half the time     How often have you found you stopped and started again several times when you urinated?  About half the time     How often have you found it difficult to postpone urination?  About half the time     How often have you had a weak urinary stream?  About half the time     How often have you had to strain to start urination?  Less than half the time     How many times did you typically get up at night to urinate?  2 Times     Total IPSS Score  21       Quality of Life due to urinary symptoms   If you were to spend the rest of your life with your urinary condition just the way it is now how would you feel about that?  Mixed  Score:  1-7 Mild 8-19 Moderate 20-35 Severe   PMH: Past Medical History:  Diagnosis Date  . Arthritis   . Cancer ([place])    Basal cell carcinoma bilateral arms and left shoulder  . Complication of anesthesia    slow to wake up with shoulder surgery  . Depression   . Enlarged prostate   . Herpes simplex   . History of kidney stones   . History of MRSA infection 2004   skin around eye  . Hypertension   . Kidney stone   . Lumbar stenosis   . Migraine headache   . PONV (postoperative nausea and vomiting)   . Tendonitis of ankle, left     Surgical History: Past Surgical History:  Procedure Laterality Date  . APPENDECTOMY    . back injection    . BACK SURGERY    . CYSTOSCOPY/URETEROSCOPY/HOLMIUM LASER/STENT PLACEMENT Left 12/18/2015   Procedure: CYSTOSCOPY/URETEROSCOPY/HOLMIUM LASER  LITHOTRIPSY;  Surgeon: Hollice Espy, MD;  Location: [facility] ORS;  Service: Urology;  Laterality: Left;  . LOWER BACK SURGERY  2010   L4-5 fusion  . LUMBAR LAMINECTOMY/DECOMPRESSION MICRODISCECTOMY N/A 08/21/2018   Procedure: LUMBAR LAMINECTOMY/DECOMPRESSION MICRODISCECTOMY 1 LEVEL-L3/4;  Surgeon: Deetta Perla, MD;  Location: [facility] ORS;  Service: Neurosurgery;  Laterality: N/A;  . NECK SURGERY     Upper neck surgery; with wires placed  . SHOULDER SURGERY Right 11/10/2011   arthroscopic surgery . Dr. Tamala Julian, [facility]  . TONSILLECTOMY      Home Medications:  Allergies as of 04/18/2019      Reactions   Penicillins Other (See Comments)   Stomach Ache      Medication List       Accurate as of April 18, 2019 10:12 AM. If you have any questions, ask your nurse or doctor.        amLODipine 2.5 MG tablet Commonly known as:  NORVASC TAKE 1 TABLET BY MOUTH ONCE EVERY EVENING   atenolol-chlorthalidone 50-25 MG tablet Commonly known as:  TENORETIC TAKE 1/2 TABLET BY MOUTH ONCE A DAY   butalbital-acetaminophen-caffeine 50-325-40 MG tablet Commonly known as:  FIORICET TAKE 1 OR 2 TABLETS  BY MOUTH EVERY 6 HOURS AS NEEDED FOR HEADACHE   diclofenac 50 MG EC tablet Commonly known as:  VOLTAREN TAKE 1 TABLET BY MOUTH TWICE A DAY   DIGESTIVE ADVANTAGE PO Take 1 tablet by mouth daily.   finasteride 5 MG tablet Commonly known as:  PROSCAR Take 1 tablet (5 mg total) by mouth daily.   latanoprost 0.005 % ophthalmic solution Commonly known as:  XALATAN Place 1 drop into both eyes at bedtime.   methocarbamol 500 MG tablet Commonly known as:  Robaxin Take 1 tablet (500 mg total) by mouth every 6 (six) hours as needed for muscle spasms.   mirtazapine 30 MG tablet Commonly known as:  REMERON Take 1 tablet (30 mg total) by mouth at bedtime.   oxyCODONE 5 MG immediate release tablet Commonly known as:  Roxicodone Take 1 tablet (5 mg total) by mouth every 4 (four) hours as needed for  breakthrough pain.   tamsulosin 0.4 MG Caps capsule Commonly known as:  FLOMAX Take 1 capsule (0.4 mg total) by mouth daily.   valACYclovir 500 MG tablet Commonly known as:  VALTREX TAKE 1 TABLET BY MOUTH 2 TIMES DAILY AS NEEDED       Allergies:  Allergies  Allergen Reactions  . Penicillins Other (See Comments)    Stomach Ache    Family History: Family History  Problem Relation Age of Onset  . Depression Son   . Diabetes Neg Hx   . Cancer Neg Hx   . Heart attack Neg Hx   . Kidney disease Neg Hx   . Prostate cancer Neg Hx   . Kidney cancer Neg Hx   . Bladder Cancer Neg Hx     Social History:  reports that he has never smoked. He has never used smokeless tobacco. He reports that he does not drink alcohol or use drugs.  ROS: UROLOGY Frequent Urination?: No Hard to postpone urination?: No Burning/pain with urination?: No Get up at night to urinate?: No Leakage of urine?: No Urine stream starts and stops?: No Trouble starting stream?: No Do you have to strain to urinate?: No Blood in urine?: No Urinary tract infection?: No Sexually transmitted disease?: No Injury to kidneys or bladder?: No Painful intercourse?: No Weak stream?: No Erection problems?: No Penile pain?: No  Gastrointestinal Nausea?: No Vomiting?: No Indigestion/heartburn?: No Diarrhea?: No Constipation?: No  Constitutional Fever: No Night sweats?: No Weight loss?: No Fatigue?: No  Skin Skin rash/lesions?: No Itching?: No  Eyes Blurred vision?: No Double vision?: No  Ears/Nose/Throat Sore throat?: No Sinus problems?: No  Hematologic/Lymphatic Swollen glands?: No Easy bruising?: No  Cardiovascular Leg swelling?: No Chest pain?: No  Respiratory Cough?: No Shortness of breath?: No  Endocrine Excessive thirst?: No  Musculoskeletal Back pain?: No Joint pain?: No  Neurological Headaches?: No Dizziness?: No  Psychologic Depression?: No Anxiety?: No  Physical  Exam: BP (!) 150/83 (BP Location: Left Arm, Patient Position: Sitting, Cuff Size: Large)   Pulse (!) 55   Ht 6' 3.5" (1.918 m)   Wt 216 lb 11.2 oz (98.3 kg)   BMI 26.73 kg/m   Constitutional:  Well nourished. Alert and oriented, No acute distress. HEENT: [place] AT, moist mucus membranes.  Trachea midline, no masses. Cardiovascular: No clubbing, cyanosis, or edema. Respiratory: Normal respiratory effort, no increased work of breathing. GI: Abdomen is soft, non tender, non distended, no abdominal masses. Liver and spleen not palpable.  No hernias appreciated.  Stool sample for occult testing is not indicated.   GU: No CVA tenderness.  No bladder fullness or masses.  Patient with circumcised phallus.  Urethral meatus is patent.  No penile discharge. No penile lesions or rashes. Scrotum without lesions, cysts, rashes and/or edema.  Testicles are located scrotally bilaterally. No masses are appreciated in the testicles. Left and right epididymis are normal. Rectal: Patient with  normal sphincter tone. Anus and perineum without scarring or rashes. No rectal masses are appreciated. Prostate is approximately 40 grams, no nodules are appreciated. Seminal vesicles are normal. Skin: No rashes, bruises or suspicious lesions. Lymph: No cervical or inguinal adenopathy. Neurologic: Grossly intact, no focal deficits, moving all 4 extremities. Psychiatric: Normal mood and affect.  Laboratory Data: Urinalysis Negative.  See Epic.  Lab Results  Component Value Date   WBC 8.1 08/14/2018   HGB 17.2 08/14/2018   HCT 51.1 08/14/2018   MCV 97.3 08/14/2018   PLT 225 08/14/2018    Lab Results  Component Value Date   CREATININE 1.09 12/26/2018    Lab Results  Component Value Date   PSA 0.1 11/28/2014   PSA History  <0.1 ng/mL on 11/24/2015  <0.1 ng/mL on 12/21/2017  <0.1 ng/mL on 04/16/2019   Lab Results  Component Value Date   TSH 1.02 02/27/2012       Component Value Date/Time   CHOL 170  12/26/2018 0735  HDL 51 12/26/2018 0735   CHOLHDL 3.3 12/26/2018 0735   LDLCALC 90 12/26/2018 0735    Lab Results  Component Value Date   AST 24 12/26/2018   Lab Results  Component Value Date   ALT 30 12/26/2018   I have reviewed the labs.  Pertinent Imaging: Results for CHUONG, CASEBEER (MRN [id number]) as of 04/18/2019 09:53  Ref. Range 04/18/2019 09:51  Scan Result Unknown 68mL    CLINICAL DATA:  History of nephrolithiasis  EXAM: ABDOMEN - 1 VIEW  COMPARISON:  February 06, 2018.  FINDINGS: Stool overlies a portion of the colon. There is a 3 mm calculus in the upper pole of the right kidney. There is a nearby 3 mm calculus in the upper pole region. There is a 5 mm calculus in the mid right kidney with a nearby 2 mm calculus. No calculi are demonstrable on the left. No other abnormal calcifications are evident.  There is moderate stool throughout the colon. There is no bowel dilatation or air-fluid level to suggest bowel obstruction. No free air.  IMPRESSION: Several small calculi in the right kidney. These calculi may have been present previously but stool overlying the right colon on previous study obscures anatomic detail. There is much less stool currently overlying the right kidney.  No bowel obstruction.  No free air evident.   Electronically Signed   By: Lowella Grip III M.D.   On: 04/16/2019 08:45 I have independently reviewed the films  Assessment & Plan:    1. Gross hematuria I explained to the patient that there are a number of causes that can be associated with blood in the urine, such as stones, BPH, UTI's, damage to the urinary tract and/or cancer.  We do know that he has stones, but we cannot be sure that the blood was not from a malignant source.  We discussed the AUA guidelines for a hematuria workup which would include a CT urogram and a cystoscopy.  He will like to pursue a work up at this time.   I explained to the patient that a  contrast material will be injected into a vein and that in rare instances, an allergic reaction can result and may even life threatening (1:100,000)  The patient denies any allergies to contrast, iodine and/or seafood and is not taking metformin Following the imaging study,  I've recommended a cystoscopy. I described how this is performed, typically in an office setting with a flexible cystoscope. We described the risks, benefits, and possible side effects, the most common of which is a minor amount of blood in the urine and/or burning which usually resolves in 24 to 48 hours.   The patient had the opportunity to ask questions which were answered. Based upon this discussion, the patient is willing to proceed. Therefore, I've ordered: a CT Urogram and cystoscopy. The patient will return following all of the above for discussion of the results.  UA  Urine culture BUN + creatinine    2. LU TS I PSS is 21/3, it is worsening Continue conservative management Continue tamsulosin 0.4 mg and finasteride 5 mg daily; refills given Cystoscopy pending  3. History of nephrolithiasis Recent KUB demonstrates right renal stones CTU pending   Return for CT Urogram report and cystoscopy.  These notes generated with voice recognition software. I apologize for typographical errors.  Zara Council, PA-C  [facility] [address] [place], [place] [address] [phone]

## 2019-04-18 ENCOUNTER — Ambulatory Visit (INDEPENDENT_AMBULATORY_CARE_PROVIDER_SITE_OTHER): Payer: Medicare Other | Admitting: Urology

## 2019-04-18 ENCOUNTER — Encounter: Payer: Self-pay | Admitting: Urology

## 2019-04-18 ENCOUNTER — Other Ambulatory Visit: Payer: Self-pay

## 2019-04-18 VITALS — BP 150/83 | HR 55 | Ht 75.5 in | Wt 216.7 lb

## 2019-04-18 DIAGNOSIS — N200 Calculus of kidney: Secondary | ICD-10-CM | POA: Diagnosis not present

## 2019-04-18 DIAGNOSIS — N401 Enlarged prostate with lower urinary tract symptoms: Secondary | ICD-10-CM | POA: Diagnosis not present

## 2019-04-18 DIAGNOSIS — R350 Frequency of micturition: Secondary | ICD-10-CM | POA: Diagnosis not present

## 2019-04-18 DIAGNOSIS — R351 Nocturia: Secondary | ICD-10-CM | POA: Diagnosis not present

## 2019-04-18 DIAGNOSIS — N138 Other obstructive and reflux uropathy: Secondary | ICD-10-CM

## 2019-04-18 DIAGNOSIS — R310 Gross hematuria: Secondary | ICD-10-CM | POA: Diagnosis not present

## 2019-04-18 LAB — URINALYSIS, COMPLETE
Bilirubin, UA: NEGATIVE
Glucose, UA: NEGATIVE
Ketones, UA: NEGATIVE
Leukocytes,UA: NEGATIVE
Nitrite, UA: NEGATIVE
Protein,UA: NEGATIVE
RBC, UA: NEGATIVE
Specific Gravity, UA: 1.025 (ref 1.005–1.030)
Urobilinogen, Ur: 0.2 mg/dL (ref 0.2–1.0)
pH, UA: 7.5 (ref 5.0–7.5)

## 2019-04-18 LAB — MICROSCOPIC EXAMINATION
Epithelial Cells (non renal): NONE SEEN /hpf (ref 0–10)
RBC, Urine: NONE SEEN /hpf (ref 0–2)

## 2019-04-18 LAB — BLADDER SCAN AMB NON-IMAGING

## 2019-04-18 MED ORDER — FINASTERIDE 5 MG PO TABS: 5.0000 mg | ORAL_TABLET | Freq: Every day | ORAL | 3 refills | Status: DC

## 2019-04-18 MED ORDER — TAMSULOSIN HCL 0.4 MG PO CAPS: 0.4000 mg | ORAL_CAPSULE | Freq: Every day | ORAL | 3 refills | Status: DC

## 2019-04-18 NOTE — Patient Instructions (Signed)

## 2019-04-19 LAB — BUN+CREAT
BUN/Creatinine Ratio: 23 (ref 10–24)
BUN: 22 mg/dL (ref 8–27)
Creatinine, Ser: 0.95 mg/dL (ref 0.76–1.27)
GFR calc Af Amer: 92 mL/min/{1.73_m2} (ref >59)
GFR calc non Af Amer: 80 mL/min/{1.73_m2} (ref >59)

## 2019-04-21 LAB — CULTURE, URINE COMPREHENSIVE

## 2019-05-03 ENCOUNTER — Ambulatory Visit
Admission: RE | Admit: 2019-05-03 | Discharge: 2019-05-03 | Disposition: A | Discharge status: Home / Self Care | Payer: Medicare Other | Source: Ambulatory Visit | Attending: Urology | Admitting: Urology

## 2019-05-03 ENCOUNTER — Other Ambulatory Visit: Payer: Self-pay

## 2019-05-03 DIAGNOSIS — R310 Gross hematuria: Secondary | ICD-10-CM | POA: Diagnosis not present

## 2019-05-03 DIAGNOSIS — N200 Calculus of kidney: Secondary | ICD-10-CM | POA: Diagnosis not present

## 2019-05-03 MED ORDER — IOHEXOL 300 MG/ML  SOLN
125.0000 mL | Freq: Once | INTRAMUSCULAR | Status: AC | PRN
  Administered 2019-05-03: 125 mL via INTRAVENOUS

## 2019-05-07 ENCOUNTER — Other Ambulatory Visit: Payer: Self-pay

## 2019-05-07 DIAGNOSIS — G4700 Insomnia, unspecified: Secondary | ICD-10-CM

## 2019-05-07 DIAGNOSIS — F329 Major depressive disorder, single episode, unspecified: Secondary | ICD-10-CM

## 2019-05-07 DIAGNOSIS — F32A Depression, unspecified: Secondary | ICD-10-CM

## 2019-05-08 MED ORDER — MIRTAZAPINE 30 MG PO TABS: 30.0000 mg | ORAL_TABLET | Freq: Every day | ORAL | 12 refills | Status: DC

## 2019-05-09 ENCOUNTER — Other Ambulatory Visit: Payer: Self-pay

## 2019-05-09 ENCOUNTER — Encounter: Payer: Self-pay | Admitting: Urology

## 2019-05-09 ENCOUNTER — Ambulatory Visit (INDEPENDENT_AMBULATORY_CARE_PROVIDER_SITE_OTHER): Payer: Medicare Other | Admitting: Urology

## 2019-05-09 VITALS — BP 134/79 | HR 57 | Ht 75.5 in | Wt 214.0 lb

## 2019-05-09 DIAGNOSIS — R310 Gross hematuria: Secondary | ICD-10-CM

## 2019-05-09 DIAGNOSIS — R319 Hematuria, unspecified: Secondary | ICD-10-CM | POA: Diagnosis not present

## 2019-05-09 DIAGNOSIS — N3289 Other specified disorders of bladder: Secondary | ICD-10-CM

## 2019-05-09 DIAGNOSIS — N200 Calculus of kidney: Secondary | ICD-10-CM

## 2019-05-09 DIAGNOSIS — N401 Enlarged prostate with lower urinary tract symptoms: Secondary | ICD-10-CM

## 2019-05-09 DIAGNOSIS — N138 Other obstructive and reflux uropathy: Secondary | ICD-10-CM

## 2019-05-09 LAB — URINALYSIS, COMPLETE
Bilirubin, UA: NEGATIVE
Glucose, UA: NEGATIVE
Ketones, UA: NEGATIVE
Leukocytes,UA: NEGATIVE
Nitrite, UA: NEGATIVE
Protein,UA: NEGATIVE
Specific Gravity, UA: 1.02 (ref 1.005–1.030)
Urobilinogen, Ur: 0.2 mg/dL (ref 0.2–1.0)
pH, UA: 7.5 (ref 5.0–7.5)

## 2019-05-09 LAB — MICROSCOPIC EXAMINATION
Bacteria, UA: NONE SEEN
Epithelial Cells (non renal): NONE SEEN /hpf (ref 0–10)

## 2019-05-09 NOTE — Progress Notes (Signed)
   05/09/19  CC:  Chief Complaint  Patient presents with  . Cysto    HPI: 73 year old male with a history of gross hematuria and BPH with lower urinary tract symptoms who presents today for cystoscopic evaluation.  He underwent CT urogram on 05/03/2019 which was unremarkable other than for bilateral nonobstructing stones up to 5 mm.  This personally reviewed today.  Blood pressure 134/79, pulse (!) 57, height 6' 3.5" (1.918 m), weight 214 lb (97.1 kg). NED. A&Ox3.   No respiratory distress   Abd soft, NT, ND Normal phallus with bilateral descended testicles  Cystoscopy Procedure Note  Patient identification was confirmed, informed consent was obtained, and patient was prepped using Betadine solution.  Lidocaine jelly was administered per urethral meatus.     Pre-Procedure: - Inspection reveals a normal caliber ureteral meatus.  Procedure: The flexible cystoscope was introduced without difficulty - No urethral strictures/lesions are present. - Enlarged prostate bilobar coaptation, 4 cm prosthetic length - Normal bladder neck - Bilateral ureteral orifices identified - Bladder mucosa  reveals no ulcers, tumors, or lesions except for small area of erythema near the dome of the bladder measuring approximately 1 cm, no distinct borders, nonspecific in appearance - No bladder stones - Minimal trabeculation  Retroflexion unremarkable   Post-Procedure: - Patient tolerated the procedure well  Assessment/ Plan:  1. Gross hematuria Etiology unclear, unlikely related to nonspecific bladder erythema on cystoscopy May be be related to possible stone episode - Urinalysis, Complete  2. Erythematous bladder mucosa Nonspecific finding in the dome of the bladder, question scope trauma versus true pathology Plan to send urine cytology today We discussed options including proceed to the operating room for biopsy or close follow-up in about 2 to 3 weeks to assess her possible resolution  of this lesion He is most interested in surveillance, will return in a few weeks to reassess If this persists at this point in time, would recommend bladder biopsy He is agreeable this plan  3. Kidney stones Small nonobstructing kidney stones, will manage clinically this time  4. BPH with obstruction/lower urinary tract symptoms Poorly controlled symptoms, will discuss at follow-up visit, may be a good candidate for outlet procedure   Return in about 3 weeks (around 05/30/2019) for cysto.  Hollice Espy, MD

## 2019-05-15 ENCOUNTER — Other Ambulatory Visit: Payer: Self-pay | Admitting: Urology

## 2019-05-22 DIAGNOSIS — H401131 Primary open-angle glaucoma, bilateral, mild stage: Secondary | ICD-10-CM | POA: Diagnosis not present

## 2019-05-23 DIAGNOSIS — L578 Other skin changes due to chronic exposure to nonionizing radiation: Secondary | ICD-10-CM | POA: Diagnosis not present

## 2019-05-23 DIAGNOSIS — L820 Inflamed seborrheic keratosis: Secondary | ICD-10-CM | POA: Diagnosis not present

## 2019-05-23 DIAGNOSIS — Z1283 Encounter for screening for malignant neoplasm of skin: Secondary | ICD-10-CM | POA: Diagnosis not present

## 2019-05-23 DIAGNOSIS — Z85828 Personal history of other malignant neoplasm of skin: Secondary | ICD-10-CM | POA: Diagnosis not present

## 2019-05-23 DIAGNOSIS — L570 Actinic keratosis: Secondary | ICD-10-CM | POA: Diagnosis not present

## 2019-05-23 DIAGNOSIS — L821 Other seborrheic keratosis: Secondary | ICD-10-CM | POA: Diagnosis not present

## 2019-05-30 ENCOUNTER — Other Ambulatory Visit: Payer: Self-pay

## 2019-05-30 ENCOUNTER — Encounter: Payer: Self-pay | Admitting: Urology

## 2019-05-30 ENCOUNTER — Ambulatory Visit (INDEPENDENT_AMBULATORY_CARE_PROVIDER_SITE_OTHER): Payer: Medicare Other | Admitting: Urology

## 2019-05-30 VITALS — BP 173/86 | HR 82 | Ht 75.5 in | Wt 214.0 lb

## 2019-05-30 DIAGNOSIS — R310 Gross hematuria: Secondary | ICD-10-CM

## 2019-05-30 LAB — URINALYSIS, COMPLETE
Bilirubin, UA: NEGATIVE
Glucose, UA: NEGATIVE
Ketones, UA: NEGATIVE
Nitrite, UA: NEGATIVE
Protein,UA: NEGATIVE
Specific Gravity, UA: 1.02 (ref 1.005–1.030)
Urobilinogen, Ur: 0.2 mg/dL (ref 0.2–1.0)
pH, UA: 7 (ref 5.0–7.5)

## 2019-05-30 LAB — MICROSCOPIC EXAMINATION: Bacteria, UA: NONE SEEN

## 2019-05-30 NOTE — Progress Notes (Signed)
   05/30/19  CC:  Chief Complaint  Patient presents with  . Cysto    HPI: 73 year old male with a history of gross hematuria and BPH with lower urinary tract symptoms returns today for repeat cystoscopy after a small area of the dome of the bladder was appreciated on previous cystoscopy.  This felt to possibly related to scope trauma this he returns today for reevaluation   Blood pressure 134/79, pulse (!) 57, height 6' 3.5" (1.918 m), weight 214 lb (97.1 kg). NED. A&Ox3.   No respiratory distress   Abd soft, NT, ND Normal phallus with bilateral descended testicles  Cystoscopy Procedure Note  Patient identification was confirmed, informed consent was obtained, and patient was prepped using Betadine solution.  Lidocaine jelly was administered per urethral meatus.     Pre-Procedure: - Inspection reveals a normal caliber ureteral meatus.  Procedure: The flexible cystoscope was introduced without difficulty - No urethral strictures/lesions are present. - Enlarged prostate bilobar coaptation, 4 cm prosthetic length - Normal bladder neck - Bilateral ureteral orifices identified - Bladder mucosa  reveals no ulcers, tumors, or lesions including no erythematous patches the dome is previously appreciated - No bladder stones - Minimal trabeculation  Retroflexion unremarkable   Post-Procedure: - Patient tolerated the procedure well  Assessment/ Plan:  1. Gross hematuria Etiology unclear, cystoscopy today is unremarkable and CT urogram is also unremarkable May be related to stone episode - Urinalysis, Complete  2. Erythematous bladder mucosa Resolved, suspect previously appreciated area was related to scope trauma Cystoscopy today is completely unremarkable  3. Kidney stones Small nonobstructing kidney stones, will manage clinically this time  4. BPH with obstruction/lower urinary tract symptoms On maximal medical therapy Initiated conversation regarding possible outlet  procedure today, the patient is satisfied despite significant symptoms and is not interested in procedures at this time  Follow-up with Zara Council in 1 year for annual follow-up  Hollice Espy, MD

## 2019-06-07 ENCOUNTER — Other Ambulatory Visit: Payer: Self-pay

## 2019-06-07 ENCOUNTER — Encounter: Payer: Self-pay | Admitting: Family Medicine

## 2019-06-07 ENCOUNTER — Ambulatory Visit (INDEPENDENT_AMBULATORY_CARE_PROVIDER_SITE_OTHER): Payer: Medicare Other | Admitting: Family Medicine

## 2019-06-07 DIAGNOSIS — R509 Fever, unspecified: Secondary | ICD-10-CM

## 2019-06-07 DIAGNOSIS — R6889 Other general symptoms and signs: Secondary | ICD-10-CM | POA: Diagnosis not present

## 2019-06-07 DIAGNOSIS — R310 Gross hematuria: Secondary | ICD-10-CM

## 2019-06-07 DIAGNOSIS — Z20822 Contact with and (suspected) exposure to covid-19: Secondary | ICD-10-CM

## 2019-06-07 MED ORDER — CIPROFLOXACIN HCL 500 MG PO TABS: 500.0000 mg | ORAL_TABLET | Freq: Two times a day (BID) | ORAL | 0 refills | Status: DC

## 2019-06-07 NOTE — Progress Notes (Signed)
Brett Bender  MRN: [id number] DOB: February 10, 1946 Virtual Visit via Video Note  I connected with Brett Bender on 06/07/19 at  9:00 AM EDT by a video enabled telemedicine application and verified that I am speaking with the correct person using two identifiers.  Location: Patient: Home Provider: Office   I discussed the limitations of evaluation and management by telemedicine and the availability of in person appointments. The patient expressed understanding and agreed to proceed.   Subjective:  HPI   The patient is a 73 year old male who presents via electronic device.  He states he has been going to the gym daily and has been having his temperature check there whenever he goes.  He has not had any fever.  However, he complains that for the last 3 days in the afternoon evening he has been getting chills and develops body aches.  He said that this goes on through the night but by morning when he wakes he feels ok.    Patient Active Problem List   Diagnosis Date Noted  . 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 Depression 03/16/2016  . Anxiety 02/11/2016  . Nocturia 02/11/2016  . Kidney stones 01/19/2016  . Left ureteral stone 12/08/2015  . Microscopic hematuria 12/08/2015  . Hydronephrosis of left kidney 12/08/2015  . Renal stone 12/08/2015  . History of nephrolithiasis 11/24/2015  . 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 Basal cell carcinoma of skin 11/18/2015  . Onychomycosis of toenail 11/18/2015  . Neck pain 11/18/2015  . Cellulitis due to MRSA 11/18/2015  . Insomnia 05/01/2015  . BPH (benign prostatic hyperplasia) 12/14/2012  . Headache 09/11/2011  . Uncomplicated herpes simplex 05/08/2009  . LBP (low back pain) 12/03/2008  . Lumbosacral neuritis 12/03/2008  . Essential (primary) hypertension 11/09/1999    Past Medical History:  Diagnosis Date  . Arthritis   . Cancer ([place])    Basal cell carcinoma bilateral arms and left shoulder  . Complication of  anesthesia    slow to wake up with shoulder surgery  . Depression   . Enlarged prostate   . Herpes simplex   . History of kidney stones   . History of MRSA infection 2004   skin around eye  . Hypertension   . Kidney stone   . Lumbar stenosis   . Migraine headache   . PONV (postoperative nausea and vomiting)   . Tendonitis of ankle, left    Past Surgical History:  Procedure Laterality Date  . APPENDECTOMY    . back injection    . BACK SURGERY    . CYSTOSCOPY/URETEROSCOPY/HOLMIUM LASER/STENT PLACEMENT Left 12/18/2015   Procedure: CYSTOSCOPY/URETEROSCOPY/HOLMIUM LASER LITHOTRIPSY;  Surgeon: Hollice Espy, MD;  Location: [facility] ORS;  Service: Urology;  Laterality: Left;  . LOWER BACK SURGERY  2010   L4-5 fusion  . LUMBAR LAMINECTOMY/DECOMPRESSION MICRODISCECTOMY N/A 08/21/2018   Procedure: LUMBAR LAMINECTOMY/DECOMPRESSION MICRODISCECTOMY 1 LEVEL-L3/4;  Surgeon: Deetta Perla, MD;  Location: [facility] ORS;  Service: Neurosurgery;  Laterality: N/A;  . NECK SURGERY     Upper neck surgery; with wires placed  . SHOULDER SURGERY Right 11/10/2011   arthroscopic surgery . Dr. Tamala Julian, [facility]  . TONSILLECTOMY      Social History   Socioeconomic History  . Marital status: Married    Spouse name: Not on file  . Number of children: 2  . Years of education: Not on file  . Highest education level: Associate degree: occupational, Hotel manager, or vocational program  Occupational History  . Occupation: Retired  Comment: former worker Surveyor, quantity  . Financial resource strain: Not hard at all  . Food insecurity    Worry: Never true    Inability: Never true  . Transportation needs    Medical: No    Non-medical: No  Tobacco Use  . Smoking status: Never Smoker  . Smokeless tobacco: Never Used  Substance and Sexual Activity  . Alcohol use: No    Alcohol/week: 0.0 standard drinks  . Drug use: No  . Sexual activity: Yes  Lifestyle  . Physical activity    Days per week: 6 days     Minutes per session: 90 min  . Stress: Not at all  Relationships  . Social Herbalist on phone: Patient refused    Gets together: Patient refused    Attends religious service: Patient refused    Active member of club or organization: Patient refused    Attends meetings of clubs or organizations: Patient refused    Relationship status: Patient refused  . Intimate partner violence    Fear of current or ex partner: No    Emotionally abused: No    Physically abused: No    Forced sexual activity: No  Other Topics Concern  . Not on file  Social History Narrative  . Not on file    Outpatient Encounter Medications as of 06/07/2019  Medication Sig  . amLODipine (NORVASC) 2.5 MG tablet TAKE 1 TABLET BY MOUTH ONCE EVERY EVENING  . atenolol-chlorthalidone (TENORETIC) 50-25 MG tablet TAKE 1/2 TABLET BY MOUTH ONCE A DAY  . butalbital-acetaminophen-caffeine (FIORICET, ESGIC) 50-325-40 MG tablet TAKE 1 OR 2 TABLETS  BY MOUTH EVERY 6 HOURS AS NEEDED FOR HEADACHE  . diclofenac (VOLTAREN) 50 MG EC tablet TAKE 1 TABLET BY MOUTH TWICE A DAY  . finasteride (PROSCAR) 5 MG tablet Take 1 tablet (5 mg total) by mouth daily.  Marland Kitchen latanoprost (XALATAN) 0.005 % ophthalmic solution Place 1 drop into both eyes at bedtime.   . methocarbamol (ROBAXIN) 500 MG tablet Take 1 tablet (500 mg total) by mouth every 6 (six) hours as needed for muscle spasms.  . mirtazapine (REMERON) 30 MG tablet Take 1 tablet (30 mg total) by mouth at bedtime.  Marland Kitchen oxyCODONE (ROXICODONE) 5 MG immediate release tablet Take 1 tablet (5 mg total) by mouth every 4 (four) hours as needed for breakthrough pain.  . Probiotic Product (DIGESTIVE ADVANTAGE PO) Take 1 tablet by mouth daily.  . tamsulosin (FLOMAX) 0.4 MG CAPS capsule Take 1 capsule (0.4 mg total) by mouth daily.  . valACYclovir (VALTREX) 500 MG tablet TAKE 1 TABLET BY MOUTH 2 TIMES DAILY AS NEEDED   No facility-administered encounter medications on file as of 06/07/2019.      Allergies  Allergen Reactions  . Penicillins Other (See Comments)    Stomach Ache    Review of Systems  Constitutional: Positive for chills and malaise/fatigue. Negative for diaphoresis and fever.       No loss of smell or taste  HENT: Negative for congestion, ear discharge, ear pain, sinus pain, sore throat and tinnitus.   Respiratory: Negative for cough, shortness of breath and wheezing.   Cardiovascular: Negative for chest pain, palpitations, claudication and leg swelling.  Gastrointestinal: Negative for abdominal pain and diarrhea.  Musculoskeletal: Positive for myalgias.  Skin: Negative for rash.  Neurological: Negative for dizziness and headaches.    Objective:  There were no vitals taken for this visit.  WDWN male in no apparent distress.  Head: Normocephalic, atraumatic. Neck: Supple, NROM Respiratory: No apparent distress Psych: Normal mood and affect   Assessment and Plan :  1. Fever and chills Onset over the past 4 days. No respiratory symptoms but chills and body aches each evening around 6:00 pm. Temperature taken in the mornings at the gym, are always reported as normal. Schedule for COVID-19 test. - Novel Coronavirus, NAA ([facility]) - Temperature monitoring; Future  2. Suspected Covid-19 Virus Infection Has had fever with chills and body aches the past 4 days around 6:00pm. No cough, congestion, sore throat, GI upset or loss of taste/smell. No known exposure to anyone with COVID-19, but has been going to his gym daily (temperature always reported as normal the morning he goes to the gym). Has not taken his temperature at home. Will get COVID-19 test and advised to isolate at home ("don't go to the gym"). Set up home monitoring and advised to maintain pandemic restrictions. - Novel Coronavirus, NAA ([facility]) - MyChart COVID-19 home monitoring program - Temperature monitoring; Future  3. Gross hematuria Has a history of renal stones but no discomfort now. Has  cystoscopy on 05-09-19 and a repeat on 05-30-19 by Dr. Erlene Quan (urologist) to evaluate gross hematuria. May be having some post procedure infection and will treat with Cipro. Increase fluid intake and if pain or bladder pressure develops, he should contact us or Dr. Erlene Quan. - ciprofloxacin (CIPRO) 500 MG tablet; Take 1 tablet (500 mg total) by mouth 2 (two) times daily.  Dispense: 20 tablet; Refill: 0   Follow Up Instructions:    I discussed the assessment and treatment plan with the patient. The patient was provided an opportunity to ask questions and all were answered. The patient agreed with the plan and demonstrated an understanding of the instructions.   The patient was advised to call back or seek an in-person evaluation if the symptoms worsen or if the condition fails to improve as anticipated.  I provided 16 minutes of non-face-to-face time during this encounter.

## 2019-06-09 LAB — NOVEL CORONAVIRUS, NAA: SARS-CoV-2, NAA: NOT DETECTED

## 2019-06-15 ENCOUNTER — Telehealth: Payer: Self-pay

## 2019-06-15 ENCOUNTER — Ambulatory Visit (INDEPENDENT_AMBULATORY_CARE_PROVIDER_SITE_OTHER): Payer: Medicare Other | Admitting: Family Medicine

## 2019-06-15 ENCOUNTER — Other Ambulatory Visit: Payer: Self-pay

## 2019-06-15 VITALS — BP 120/76 | HR 63 | Temp 98.1°F | Wt 218.0 lb

## 2019-06-15 DIAGNOSIS — R310 Gross hematuria: Secondary | ICD-10-CM

## 2019-06-15 DIAGNOSIS — R52 Pain, unspecified: Secondary | ICD-10-CM

## 2019-06-15 DIAGNOSIS — N390 Urinary tract infection, site not specified: Secondary | ICD-10-CM | POA: Diagnosis not present

## 2019-06-15 MED ORDER — CIPROFLOXACIN HCL 500 MG PO TABS: 500.0000 mg | ORAL_TABLET | Freq: Two times a day (BID) | ORAL | 0 refills | Status: DC

## 2019-06-15 NOTE — Telephone Encounter (Signed)
Patient called and wanted to leave a message but then made appointment to come back in to see Chrismon.

## 2019-06-15 NOTE — Progress Notes (Signed)
Brett Bender  MRN: [id number] DOB: May 04, 1946  Subjective:  HPI   The patient is a 73 year old male who presents with complaint of body aches and chills.  The patient reports that he had a virtual visit on 06/07/19.  At that time he complained of symptoms "like the flu".  He had been seen by his urologist prior to that visit and had cystoscopy x 2.  It was thought on 7/30//20 that his symptoms may be the result of a possible UTI.  The patient was tested for COVID and it was negative.  He states he has 3 more pills of his antibiotic to take.   He complains that he is still feeling bad.  Generalized body aches and he gets chills and sweats in the evening.  He does not know if he has been running fever as he does not have a thermometer. He states that he did not really have any urinary symptoms and is not having any currently.  Patient Active Problem List   Diagnosis Date Noted  . 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 Depression 03/16/2016  . Anxiety 02/11/2016  . Nocturia 02/11/2016  . Kidney stones 01/19/2016  . Left ureteral stone 12/08/2015  . Microscopic hematuria 12/08/2015  . Hydronephrosis of left kidney 12/08/2015  . Renal stone 12/08/2015  . History of nephrolithiasis 11/24/2015  . 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 Basal cell carcinoma of skin 11/18/2015  . Onychomycosis of toenail 11/18/2015  . Neck pain 11/18/2015  . Cellulitis due to MRSA 11/18/2015  . Insomnia 05/01/2015  . BPH (benign prostatic hyperplasia) 12/14/2012  . Headache 09/11/2011  . Uncomplicated herpes simplex 05/08/2009  . LBP (low back pain) 12/03/2008  . Lumbosacral neuritis 12/03/2008  . Essential (primary) hypertension 11/09/1999    Past Medical History:  Diagnosis Date  . Arthritis   . Cancer ([place])    Basal cell carcinoma bilateral arms and left shoulder  . Complication of anesthesia    slow to wake up with shoulder surgery  . Depression   . Enlarged prostate   . Herpes  simplex   . History of kidney stones   . History of MRSA infection 2004   skin around eye  . Hypertension   . Kidney stone   . Lumbar stenosis   . Migraine headache   . PONV (postoperative nausea and vomiting)   . Tendonitis of ankle, left     Social History   Socioeconomic History  . Marital status: Married    Spouse name: Not on file  . Number of children: 2  . Years of education: Not on file  . Highest education level: Associate degree: occupational, Hotel manager, or vocational program  Occupational History  . Occupation: Retired    Comment: former Arts administrator  . Financial resource strain: Not hard at all  . Food insecurity    Worry: Never true    Inability: Never true  . Transportation needs    Medical: No    Non-medical: No  Tobacco Use  . Smoking status: Never Smoker  . Smokeless tobacco: Never Used  Substance and Sexual Activity  . Alcohol use: No    Alcohol/week: 0.0 standard drinks  . Drug use: No  . Sexual activity: Yes  Lifestyle  . Physical activity    Days per week: 6 days    Minutes per session: 90 min  . Stress: Not at all  Relationships  . Social connections    Talks  on phone: Patient refused    Gets together: Patient refused    Attends religious service: Patient refused    Active member of club or organization: Patient refused    Attends meetings of clubs or organizations: Patient refused    Relationship status: Patient refused  . Intimate partner violence    Fear of current or ex partner: No    Emotionally abused: No    Physically abused: No    Forced sexual activity: No  Other Topics Concern  . Not on file  Social History Narrative  . Not on file    Outpatient Encounter Medications as of 06/15/2019  Medication Sig  . amLODipine (NORVASC) 2.5 MG tablet TAKE 1 TABLET BY MOUTH ONCE EVERY EVENING  . atenolol-chlorthalidone (TENORETIC) 50-25 MG tablet TAKE 1/2 TABLET BY MOUTH ONCE A DAY  .  butalbital-acetaminophen-caffeine (FIORICET, ESGIC) 50-325-40 MG tablet TAKE 1 OR 2 TABLETS  BY MOUTH EVERY 6 HOURS AS NEEDED FOR HEADACHE  . ciprofloxacin (CIPRO) 500 MG tablet Take 1 tablet (500 mg total) by mouth 2 (two) times daily.  . diclofenac (VOLTAREN) 50 MG EC tablet TAKE 1 TABLET BY MOUTH TWICE A DAY  . finasteride (PROSCAR) 5 MG tablet Take 1 tablet (5 mg total) by mouth daily.  Marland Kitchen latanoprost (XALATAN) 0.005 % ophthalmic solution Place 1 drop into both eyes at bedtime.   . methocarbamol (ROBAXIN) 500 MG tablet Take 1 tablet (500 mg total) by mouth every 6 (six) hours as needed for muscle spasms.  . mirtazapine (REMERON) 30 MG tablet Take 1 tablet (30 mg total) by mouth at bedtime.  Marland Kitchen oxyCODONE (ROXICODONE) 5 MG immediate release tablet Take 1 tablet (5 mg total) by mouth every 4 (four) hours as needed for breakthrough pain.  . Probiotic Product (DIGESTIVE ADVANTAGE PO) Take 1 tablet by mouth daily.  . tamsulosin (FLOMAX) 0.4 MG CAPS capsule Take 1 capsule (0.4 mg total) by mouth daily.  . valACYclovir (VALTREX) 500 MG tablet TAKE 1 TABLET BY MOUTH 2 TIMES DAILY AS NEEDED   No facility-administered encounter medications on file as of 06/15/2019.     Allergies  Allergen Reactions  . Penicillins Other (See Comments)    Stomach Ache    Review of Systems  Constitutional: Positive for chills, diaphoresis and malaise/fatigue. Negative for fever.  HENT: Negative for congestion, ear pain, sinus pain and sore throat.   Respiratory: Negative for cough and shortness of breath.   Gastrointestinal: Negative for abdominal pain, nausea and vomiting.  Musculoskeletal: Positive for myalgias.    Objective:  BP 120/76 (BP Location: Right Arm, Patient Position: Sitting, Cuff Size: Normal)   Pulse 63   Temp 98.1 F (36.7 C) (Oral)   Wt 218 lb (98.9 kg)   SpO2 95%   BMI 26.89 kg/m   Physical Exam  Constitutional: He is oriented to person, place, and time and well-developed, well-nourished,  and in no distress.  HENT:  Head: Normocephalic.  Eyes: Conjunctivae are normal.  Neck: Neck supple.  Cardiovascular: Normal rate and regular rhythm.  Pulmonary/Chest: Effort normal and breath sounds normal.  Abdominal: Soft. Bowel sounds are normal. There is no abdominal tenderness.  No CVA tenderness to percussion posteriorly or abdominal discomfort.  Musculoskeletal: Normal range of motion.  Neurological: He is alert and oriented to person, place, and time.  Skin: No rash noted.  Psychiatric: Mood, affect and judgment normal.    Assessment and Plan :   1. Urinary tract infection without hematuria, site unspecified Treated for suspected  UTI post cystoscopy on 05-09-19 and 05-30-19 by Dr. Erlene Quan (urologist) with history of hematuria. KUB on 04-16-19 showed several small calculi in the right kidney and had hematuria and dysuria resolve spontaneously. Urinalysis today shows concentrated urine with many WBC's, 1-2+ bacteria and 0-1 granular casts on hpf. Suspect UTI secondary to cystoscopic procedures. Will give Cipro and get C&S with labs to check renal function and evidence of anemia. - CBC with Differential/Platelet - Comprehensive metabolic panel - CULTURE, URINE COMPREHENSIVE - ciprofloxacin (CIPRO) 500 MG tablet; Take 1 tablet (500 mg total) by mouth 2 (two) times daily.  Dispense: 10 tablet; Refill: 0  2. Generalized body aches Intermittent body aches and chills since last cystoscopy. Tylenol helps to control symptoms. Denies cough, URI, sore throat, dyspnea, loss of taste/smell and no GI upset. No fever today. Will get CBC, CMP and urine C&S. Increase fluid intake and recheck pending culture report. - CBC with Differential/Platelet - Comprehensive metabolic panel - CULTURE, URINE COMPREHENSIVE  3. Gross hematuria No gross hematuria today. Has had cystoscopic evaluations in June and July 2020 by Dr. Erlene Quan (urology) with history of BPH and nephrolithiasis (2017). Urinalysis only  shows a trace of RBC's by dipstick today. May need follow up with urologist if recurrent.

## 2019-06-16 LAB — CBC WITH DIFFERENTIAL/PLATELET
Basophils Absolute: 0.1 10*3/uL (ref 0.0–0.2)
Basos: 2 %
EOS (ABSOLUTE): 0.1 10*3/uL (ref 0.0–0.4)
Eos: 1 %
Hematocrit: 47.4 % (ref 37.5–51.0)
Hemoglobin: 16.6 g/dL (ref 13.0–17.7)
Immature Grans (Abs): 0 10*3/uL (ref 0.0–0.1)
Immature Granulocytes: 0 %
Lymphocytes Absolute: 3 10*3/uL (ref 0.7–3.1)
Lymphs: 42 %
MCH: 32.2 pg (ref 26.6–33.0)
MCHC: 35 g/dL (ref 31.5–35.7)
MCV: 92 fL (ref 79–97)
Monocytes Absolute: 0.8 10*3/uL (ref 0.1–0.9)
Monocytes: 11 %
Neutrophils Absolute: 3.1 10*3/uL (ref 1.4–7.0)
Neutrophils: 44 %
Platelets: 179 10*3/uL (ref 150–450)
RBC: 5.16 x10E6/uL (ref 4.14–5.80)
RDW: 12.9 % (ref 11.6–15.4)
WBC: 7 10*3/uL (ref 3.4–10.8)

## 2019-06-16 LAB — COMPREHENSIVE METABOLIC PANEL
ALT: 81 IU/L — ABNORMAL HIGH (ref 0–44)
AST: 61 IU/L — ABNORMAL HIGH (ref 0–40)
Albumin/Globulin Ratio: 1.4 (ref 1.2–2.2)
Albumin: 3.9 g/dL (ref 3.7–4.7)
Alkaline Phosphatase: 112 IU/L (ref 39–117)
BUN/Creatinine Ratio: 13 (ref 10–24)
BUN: 18 mg/dL (ref 8–27)
Bilirubin Total: 0.5 mg/dL (ref 0.0–1.2)
CO2: 24 mmol/L (ref 20–29)
Calcium: 9.3 mg/dL (ref 8.6–10.2)
Chloride: 98 mmol/L (ref 96–106)
Creatinine, Ser: 1.35 mg/dL — ABNORMAL HIGH (ref 0.76–1.27)
GFR calc Af Amer: 60 mL/min/{1.73_m2} (ref >59)
GFR calc non Af Amer: 52 mL/min/{1.73_m2} — ABNORMAL LOW (ref >59)
Globulin, Total: 2.7 g/dL (ref 1.5–4.5)
Glucose: 119 mg/dL — ABNORMAL HIGH (ref 65–99)
Potassium: 4.2 mmol/L (ref 3.5–5.2)
Sodium: 136 mmol/L (ref 134–144)
Total Protein: 6.6 g/dL (ref 6.0–8.5)

## 2019-06-18 LAB — CULTURE, URINE COMPREHENSIVE

## 2019-06-19 ENCOUNTER — Encounter: Payer: Self-pay | Admitting: Family Medicine

## 2019-06-19 ENCOUNTER — Telehealth: Payer: Self-pay | Admitting: Family Medicine

## 2019-06-19 LAB — POCT URINALYSIS DIPSTICK
Glucose, UA: NEGATIVE
Nitrite, UA: NEGATIVE
Protein, UA: POSITIVE — AB
Spec Grav, UA: >=1.03 — AB (ref 1.010–1.025)
Urobilinogen, UA: 0.2 E.U./dL
pH, UA: 5 (ref 5.0–8.0)

## 2019-06-19 NOTE — Telephone Encounter (Signed)
Pt needing lab results from urine and bloodwork.  Please call pt back to discuss asap.  Thanks, [employer]

## 2019-06-19 NOTE — Telephone Encounter (Signed)
Urine culture did not grow any bacteria. Liver enzymes shows some elevation and kidney function shows slight stress. Normal blood counts without sign of bacterial infection. Patient states he will finish all the antibiotics in 2 days and is feeling better now. Encouraged to drink plenty of water and recheck if any symptoms return or persist.

## 2019-06-26 DIAGNOSIS — H401131 Primary open-angle glaucoma, bilateral, mild stage: Secondary | ICD-10-CM | POA: Diagnosis not present

## 2019-06-27 DIAGNOSIS — Z85828 Personal history of other malignant neoplasm of skin: Secondary | ICD-10-CM | POA: Diagnosis not present

## 2019-06-27 DIAGNOSIS — D485 Neoplasm of uncertain behavior of skin: Secondary | ICD-10-CM | POA: Diagnosis not present

## 2019-06-27 DIAGNOSIS — L578 Other skin changes due to chronic exposure to nonionizing radiation: Secondary | ICD-10-CM | POA: Diagnosis not present

## 2019-06-27 DIAGNOSIS — L570 Actinic keratosis: Secondary | ICD-10-CM | POA: Diagnosis not present

## 2019-06-27 DIAGNOSIS — L98499 Non-pressure chronic ulcer of skin of other sites with unspecified severity: Secondary | ICD-10-CM | POA: Diagnosis not present

## 2019-06-28 ENCOUNTER — Encounter: Payer: Self-pay | Admitting: Family Medicine

## 2019-06-28 ENCOUNTER — Ambulatory Visit
Admission: RE | Admit: 2019-06-28 | Discharge: 2019-06-28 | Disposition: A | Discharge status: Home / Self Care | Payer: Medicare Other | Source: Ambulatory Visit | Attending: Family Medicine | Admitting: Family Medicine

## 2019-06-28 ENCOUNTER — Ambulatory Visit (INDEPENDENT_AMBULATORY_CARE_PROVIDER_SITE_OTHER): Payer: Medicare Other | Admitting: Family Medicine

## 2019-06-28 ENCOUNTER — Other Ambulatory Visit: Payer: Self-pay | Admitting: Family Medicine

## 2019-06-28 ENCOUNTER — Other Ambulatory Visit: Payer: Self-pay

## 2019-06-28 VITALS — BP 151/82 | HR 69 | Temp 96.7°F | Resp 18 | Wt 222.8 lb

## 2019-06-28 DIAGNOSIS — R05 Cough: Secondary | ICD-10-CM

## 2019-06-28 DIAGNOSIS — R0602 Shortness of breath: Secondary | ICD-10-CM | POA: Diagnosis not present

## 2019-06-28 DIAGNOSIS — R059 Cough, unspecified: Secondary | ICD-10-CM

## 2019-06-28 DIAGNOSIS — R531 Weakness: Secondary | ICD-10-CM

## 2019-06-28 DIAGNOSIS — N390 Urinary tract infection, site not specified: Secondary | ICD-10-CM

## 2019-06-28 DIAGNOSIS — M791 Myalgia, unspecified site: Secondary | ICD-10-CM

## 2019-06-28 DIAGNOSIS — R7989 Other specified abnormal findings of blood chemistry: Secondary | ICD-10-CM | POA: Diagnosis not present

## 2019-06-28 MED ORDER — PREDNISONE 20 MG PO TABS: 20.0000 mg | ORAL_TABLET | Freq: Every day | ORAL | 0 refills | Status: AC

## 2019-06-28 NOTE — Progress Notes (Signed)
Patient: Brett Bender Male    DOB: [birth year]   73 y.o.   MRN: [id number] Visit Date: 06/28/2019  Today's Provider: Wilhemena Durie, MD   Chief Complaint  Patient presents with  . Fatigue  . Shortness of Breath   Subjective:     HPI  Patient is experience shortness of breath, and fatigue for 5 days. Has to continuously clear his throat.  Patient has been going to the gym, and not using a mask there during work outs. Had negative Covid a few days ago. His complaint today is weakness,dyspnea,orthopnea .   Allergies  Allergen Reactions  . Penicillins Other (See Comments)    Stomach Ache     Current Outpatient Medications:  .  amLODipine (NORVASC) 2.5 MG tablet, TAKE 1 TABLET BY MOUTH ONCE EVERY EVENING, Disp: 90 tablet, Rfl: 4 .  atenolol-chlorthalidone (TENORETIC) 50-25 MG tablet, TAKE 1/2 TABLET BY MOUTH ONCE A DAY, Disp: 45 tablet, Rfl: 11 .  butalbital-acetaminophen-caffeine (FIORICET, ESGIC) 50-325-40 MG tablet, TAKE 1 OR 2 TABLETS  BY MOUTH EVERY 6 HOURS AS NEEDED FOR HEADACHE, Disp: 20 tablet, Rfl: 5 .  diclofenac (VOLTAREN) 50 MG EC tablet, TAKE 1 TABLET BY MOUTH TWICE A DAY, Disp: 180 tablet, Rfl: 4 .  finasteride (PROSCAR) 5 MG tablet, Take 1 tablet (5 mg total) by mouth daily., Disp: 90 tablet, Rfl: 3 .  latanoprost (XALATAN) 0.005 % ophthalmic solution, Place 1 drop into both eyes at bedtime. , Disp: , Rfl:  .  methocarbamol (ROBAXIN) 500 MG tablet, Take 1 tablet (500 mg total) by mouth every 6 (six) hours as needed for muscle spasms., Disp: 60 tablet, Rfl: 0 .  mirtazapine (REMERON) 30 MG tablet, Take 1 tablet (30 mg total) by mouth at bedtime., Disp: 30 tablet, Rfl: 12 .  oxyCODONE (ROXICODONE) 5 MG immediate release tablet, Take 1 tablet (5 mg total) by mouth every 4 (four) hours as needed for breakthrough pain., Disp: 30 tablet, Rfl: 0 .  Probiotic Product (DIGESTIVE ADVANTAGE PO), Take 1 tablet by mouth daily., Disp: , Rfl:  .  tamsulosin (FLOMAX) 0.4  MG CAPS capsule, Take 1 capsule (0.4 mg total) by mouth daily., Disp: 90 capsule, Rfl: 3 .  valACYclovir (VALTREX) 500 MG tablet, TAKE 1 TABLET BY MOUTH 2 TIMES DAILY AS NEEDED, Disp: 20 tablet, Rfl: 5 .  ciprofloxacin (CIPRO) 500 MG tablet, Take 1 tablet (500 mg total) by mouth 2 (two) times daily. (Patient not taking: Reported on 06/28/2019), Disp: 10 tablet, Rfl: 0  Review of Systems  Constitutional: Positive for fatigue.  HENT: Negative.   Eyes: Negative.   Respiratory: Positive for shortness of breath.   Gastrointestinal: Negative.   Endocrine: Negative.   Genitourinary: Negative.   Musculoskeletal: Positive for myalgias.  Skin: Negative.   Allergic/Immunologic: Negative.   Neurological: Negative.   Psychiatric/Behavioral: Negative.     Social History   Tobacco Use  . Smoking status: Never Smoker  . Smokeless tobacco: Never Used  Substance Use Topics  . Alcohol use: No    Alcohol/week: 0.0 standard drinks      Objective:   BP (!) 151/82 (BP Location: Right Arm, Patient Position: Sitting, Cuff Size: Large)   Pulse 69   Temp (!) 96.7 F (35.9 C) (Oral)   Resp 18   Wt 222 lb 12.8 oz (101.1 kg)   BMI 27.48 kg/m  Vitals:   06/28/19 1036  BP: (!) 151/82  Pulse: 69  Resp: 18  Temp: (!)  96.7 F (35.9 C)  TempSrc: Oral  Weight: 222 lb 12.8 oz (101.1 kg)     Physical Exam Vitals signs reviewed.  Constitutional:      Appearance: He is well-developed.  HENT:     Head: Normocephalic and atraumatic.  Neck:     Musculoskeletal: Neck supple.     Vascular: No hepatojugular reflux or JVD.  Cardiovascular:     Rate and Rhythm: Normal rate and regular rhythm.  Pulmonary:     Effort: Pulmonary effort is normal.     Breath sounds: Normal breath sounds.  Abdominal:     Palpations: Abdomen is soft.  Musculoskeletal:     Right lower leg: No edema.     Left lower leg: No edema.  Skin:    General: Skin is warm and dry.  Neurological:     General: No focal deficit  present.     Mental Status: He is alert.  Psychiatric:        Mood and Affect: Mood normal.        Behavior: Behavior normal.      No results found for any visits on 06/28/19.     Assessment & Plan    1. Shortness of breath No s/s of PE other than SOB--check Ddimer. More than 50% 25 minute visit spent in counseling or coordination of care  - CT Angio Chest W/Cm &/Or Wo Cm; Future  2. Myalgia  - D-Dimer, Quantitative - CK (Creatine Kinase) - Sed Rate (ESR) - Pro b natriuretic peptide (BNP)[facility]) - Troponin I - Comp. Metabolic Panel (12) - CBC w/Diff/Platelet - predniSONE (DELTASONE) 20 MG tablet; Take 1 tablet (20 mg total) by mouth daily with breakfast for 4 days.  Dispense: 4 tablet; Refill: 0  3. Urinary tract infection without hematuria, site unspecified  - Urine Culture  4. Cough  - DG Chest 2 View; Future  5. Elevated d-dimer  - CT Angio Chest W/Cm &/Or Wo Cm; Future  6. Weakness Apparent postviral syndrome. Prednisone 85m daily for 4 days. See Dr FCaryn Sectionnext week.     Danyell Awbrey GCranford Mon MD  [facility]

## 2019-06-29 ENCOUNTER — Telehealth: Payer: Self-pay | Admitting: Family Medicine

## 2019-06-29 ENCOUNTER — Ambulatory Visit
Admission: RE | Admit: 2019-06-29 | Discharge: 2019-06-29 | Disposition: A | Discharge status: Home / Self Care | Payer: Medicare Other | Source: Ambulatory Visit | Attending: Family Medicine | Admitting: Family Medicine

## 2019-06-29 DIAGNOSIS — R0602 Shortness of breath: Secondary | ICD-10-CM | POA: Diagnosis not present

## 2019-06-29 DIAGNOSIS — R7989 Other specified abnormal findings of blood chemistry: Secondary | ICD-10-CM | POA: Diagnosis not present

## 2019-06-29 LAB — COMP. METABOLIC PANEL (12)
AST: 34 IU/L (ref 0–40)
Albumin/Globulin Ratio: 1 — ABNORMAL LOW (ref 1.2–2.2)
Albumin: 3.2 g/dL — ABNORMAL LOW (ref 3.7–4.7)
Alkaline Phosphatase: 85 IU/L (ref 39–117)
BUN/Creatinine Ratio: 18 (ref 10–24)
BUN: 20 mg/dL (ref 8–27)
Bilirubin Total: 0.5 mg/dL (ref 0.0–1.2)
Calcium: 8.9 mg/dL (ref 8.6–10.2)
Chloride: 101 mmol/L (ref 96–106)
Creatinine, Ser: 1.09 mg/dL (ref 0.76–1.27)
GFR calc Af Amer: 78 mL/min/{1.73_m2} (ref >59)
GFR calc non Af Amer: 67 mL/min/{1.73_m2} (ref >59)
Globulin, Total: 3.2 g/dL (ref 1.5–4.5)
Glucose: 105 mg/dL — ABNORMAL HIGH (ref 65–99)
Potassium: 4.3 mmol/L (ref 3.5–5.2)
Sodium: 138 mmol/L (ref 134–144)
Total Protein: 6.4 g/dL (ref 6.0–8.5)

## 2019-06-29 LAB — CBC WITH DIFFERENTIAL/PLATELET
Basophils Absolute: 0.4 10*3/uL — ABNORMAL HIGH (ref 0.0–0.2)
Basos: 2 %
EOS (ABSOLUTE): 0.1 10*3/uL (ref 0.0–0.4)
Eos: 0 %
Hematocrit: 43.8 % (ref 37.5–51.0)
Hemoglobin: 14.9 g/dL (ref 13.0–17.7)
Immature Grans (Abs): 0 10*3/uL (ref 0.0–0.1)
Immature Granulocytes: 0 %
Lymphocytes Absolute: 11.4 10*3/uL — ABNORMAL HIGH (ref 0.7–3.1)
Lymphs: 74 %
MCH: 31 pg (ref 26.6–33.0)
MCHC: 34 g/dL (ref 31.5–35.7)
MCV: 91 fL (ref 79–97)
Monocytes Absolute: 1.4 10*3/uL — ABNORMAL HIGH (ref 0.1–0.9)
Monocytes: 9 %
Neutrophils Absolute: 2.3 10*3/uL (ref 1.4–7.0)
Neutrophils: 15 %
Platelets: 213 10*3/uL (ref 150–450)
RBC: 4.8 x10E6/uL (ref 4.14–5.80)
RDW: 13.4 % (ref 11.6–15.4)
WBC: 15.6 10*3/uL — ABNORMAL HIGH (ref 3.4–10.8)

## 2019-06-29 LAB — CK: Total CK: 51 U/L (ref 41–331)

## 2019-06-29 LAB — PRO B NATRIURETIC PEPTIDE: NT-Pro BNP: 178 pg/mL (ref 0–376)

## 2019-06-29 LAB — D-DIMER, QUANTITATIVE: D-DIMER: 12.03 mg/L FEU — ABNORMAL HIGH (ref 0.00–0.49)

## 2019-06-29 LAB — SEDIMENTATION RATE: Sed Rate: 5 mm/hr (ref 0–30)

## 2019-06-29 LAB — TROPONIN I: Troponin I: <0.01 ng/mL (ref 0.00–0.04)

## 2019-06-29 MED ORDER — IOHEXOL 350 MG/ML SOLN
75.0000 mL | Freq: Once | INTRAVENOUS | Status: AC | PRN
  Administered 2019-06-29: 75 mL via INTRAVENOUS

## 2019-06-29 NOTE — Telephone Encounter (Signed)
Pt advised.   Thanks,   -Laura  

## 2019-06-29 NOTE — Telephone Encounter (Signed)
PLEASE ADVISE PATIENT ELEVATED D-DIMER ON LABS COULD INDICATE A BLOOD CLOT IN HIS LUNGS AND HE NEEDS A CT OF LUNGS DONE TODAY.   Have entered order and should hear to have this schedule soon

## 2019-06-29 NOTE — Telephone Encounter (Signed)
Pt scheduled and advised.    Thanks,   -Mickel Baas

## 2019-06-29 NOTE — Telephone Encounter (Signed)
Pt called saying he was waiting for someone to call him about going over to Imaging to get a CT chest and no one has called.  He also would like a nurse call his wife back about his labs.  He didn't quite understand what he was told.  I spoke to Mickel Baas and she said she was going to call the referral dept and check on the CT.  Con Memos

## 2019-06-30 LAB — URINE CULTURE

## 2019-07-02 ENCOUNTER — Encounter: Payer: Self-pay | Admitting: Family Medicine

## 2019-07-02 ENCOUNTER — Other Ambulatory Visit: Payer: Self-pay | Admitting: Family Medicine

## 2019-07-02 ENCOUNTER — Telehealth: Payer: Self-pay

## 2019-07-02 ENCOUNTER — Other Ambulatory Visit: Payer: Self-pay

## 2019-07-02 DIAGNOSIS — M791 Myalgia, unspecified site: Secondary | ICD-10-CM

## 2019-07-02 DIAGNOSIS — R0602 Shortness of breath: Secondary | ICD-10-CM

## 2019-07-02 DIAGNOSIS — R161 Splenomegaly, not elsewhere classified: Secondary | ICD-10-CM | POA: Insufficient documentation

## 2019-07-02 DIAGNOSIS — R6889 Other general symptoms and signs: Secondary | ICD-10-CM | POA: Diagnosis not present

## 2019-07-02 DIAGNOSIS — Z20822 Contact with and (suspected) exposure to covid-19: Secondary | ICD-10-CM

## 2019-07-02 MED ORDER — DOXYCYCLINE HYCLATE 100 MG PO TABS: 100.0000 mg | ORAL_TABLET | Freq: Two times a day (BID) | ORAL | 0 refills | Status: DC

## 2019-07-02 NOTE — Telephone Encounter (Signed)
-----   Message from Birdie Sons, MD sent at 07/02/2019  1:23 PM EDT ----- Ct of lungs is negative, however he does have high lymphocyte count which is usually an indication of a viral infection. This is concerning for Covid. He needs to be tested for Covid ASAP. Please advise, have entered order.

## 2019-07-02 NOTE — Telephone Encounter (Signed)
-----   Message from Jerrol Banana., MD sent at 06/29/2019  8:49 AM EDT ----- Pneumonia--Rx doxycyline 100mg  BID for 1 week.

## 2019-07-02 NOTE — Telephone Encounter (Signed)
Mrs. Ohr advised.  They are going now.   Thanks,   -Mickel Baas

## 2019-07-02 NOTE — Telephone Encounter (Signed)
Patient advised as below. Patient verbalizes understanding and is in agreement with treatment plan.  

## 2019-07-03 ENCOUNTER — Telehealth: Payer: Self-pay

## 2019-07-03 LAB — NOVEL CORONAVIRUS, NAA: SARS-CoV-2, NAA: NOT DETECTED

## 2019-07-03 NOTE — Telephone Encounter (Signed)
Patient was scheduled to come into the office tomorrow morning at 10:40am for a 1 week follow up of SOB. Patient is still awaiting his COVID19 test results. Patient states his breathing is stable, and he is tolerating the medication that was prescribed well. I advised patient that he should not come into the office tomorrow, and we would call to follow up with him once the test results are in. Patient verbalized understanding. Dr. Caryn Section patient says his symptoms are stable and unchanged, did you want to do a Virtual or telephone follow up with him tomorrow at 10:40am?

## 2019-07-04 ENCOUNTER — Other Ambulatory Visit: Payer: Self-pay

## 2019-07-04 ENCOUNTER — Encounter: Payer: Self-pay | Admitting: Family Medicine

## 2019-07-04 ENCOUNTER — Ambulatory Visit (INDEPENDENT_AMBULATORY_CARE_PROVIDER_SITE_OTHER): Payer: Medicare Other | Admitting: Family Medicine

## 2019-07-04 ENCOUNTER — Telehealth: Payer: Self-pay

## 2019-07-04 VITALS — BP 138/86 | HR 59 | Temp 97.5°F | Resp 16 | Wt 213.4 lb

## 2019-07-04 DIAGNOSIS — J181 Lobar pneumonia, unspecified organism: Secondary | ICD-10-CM

## 2019-07-04 DIAGNOSIS — J189 Pneumonia, unspecified organism: Secondary | ICD-10-CM

## 2019-07-04 NOTE — Telephone Encounter (Signed)
-----   Message from Birdie Sons, MD sent at 07/04/2019  7:50 AM EDT ----- Covid test is negative. OK to come in today for follow up. visit

## 2019-07-04 NOTE — Patient Instructions (Signed)
.   Please review the attached list of medications and notify my office if there are any errors.   . Please bring all of your medications to every appointment so we can make sure that our medication list is the same as yours.   . It is especially important to get the annual flu vaccine this year. If you haven't had it already, please go to your pharmacy or call the office as soon as possible to schedule you flu shot.  

## 2019-07-04 NOTE — Progress Notes (Signed)
Patient: Brett Bender Male    DOB: 1945/12/04   73 y.o.   MRN: [id number] Visit Date: 07/04/2019  Today's Provider: Lelon Huh, MD   Chief Complaint  Patient presents with  . Follow-up   Subjective:     HPI  Follow up for pneumonia  The patient was last seen for this 6 days ago for shortness of breath for several weeks. Labs were remarkable for elevated wbc of 15.6 and elevated d-dimer. Normal BNP. Due to elevated d-dimer he had CTA chest which was negative for PE. Covid test was negative.  CXR did show LLL pneumonia and was  started doxycycline 100 mg BID which he started on 8/24 He states he does feel a little better, less short of breath.   He reports excellent compliance with treatment. He feels that condition is Improved. He is not having side effects.   ------------------------------------------------------------------------------------   Allergies  Allergen Reactions  . Penicillins Other (See Comments)    Stomach Ache     Current Outpatient Medications:  .  amLODipine (NORVASC) 2.5 MG tablet, TAKE 1 TABLET BY MOUTH ONCE EVERY EVENING, Disp: 90 tablet, Rfl: 4 .  atenolol-chlorthalidone (TENORETIC) 50-25 MG tablet, TAKE 1/2 TABLET BY MOUTH ONCE A DAY, Disp: 45 tablet, Rfl: 11 .  butalbital-acetaminophen-caffeine (FIORICET, ESGIC) 50-325-40 MG tablet, TAKE 1 OR 2 TABLETS  BY MOUTH EVERY 6 HOURS AS NEEDED FOR HEADACHE, Disp: 20 tablet, Rfl: 5 .  diclofenac (VOLTAREN) 50 MG EC tablet, TAKE 1 TABLET BY MOUTH TWICE A DAY, Disp: 180 tablet, Rfl: 4 .  doxycycline (VIBRA-TABS) 100 MG tablet, Take 1 tablet (100 mg total) by mouth 2 (two) times daily., Disp: 14 tablet, Rfl: 0 .  finasteride (PROSCAR) 5 MG tablet, Take 1 tablet (5 mg total) by mouth daily., Disp: 90 tablet, Rfl: 3 .  latanoprost (XALATAN) 0.005 % ophthalmic solution, Place 1 drop into both eyes at bedtime. , Disp: , Rfl:  .  methocarbamol (ROBAXIN) 500 MG tablet, Take 1 tablet (500 mg total) by  mouth every 6 (six) hours as needed for muscle spasms., Disp: 60 tablet, Rfl: 0 .  mirtazapine (REMERON) 30 MG tablet, Take 1 tablet (30 mg total) by mouth at bedtime., Disp: 30 tablet, Rfl: 12 .  oxyCODONE (ROXICODONE) 5 MG immediate release tablet, Take 1 tablet (5 mg total) by mouth every 4 (four) hours as needed for breakthrough pain., Disp: 30 tablet, Rfl: 0 .  Probiotic Product (DIGESTIVE ADVANTAGE PO), Take 1 tablet by mouth daily., Disp: , Rfl:  .  tamsulosin (FLOMAX) 0.4 MG CAPS capsule, Take 1 capsule (0.4 mg total) by mouth daily., Disp: 90 capsule, Rfl: 3 .  valACYclovir (VALTREX) 500 MG tablet, TAKE 1 TABLET BY MOUTH 2 TIMES DAILY AS NEEDED, Disp: 20 tablet, Rfl: 5  Review of Systems  Constitutional: Negative.   Respiratory: Negative.   Cardiovascular: Negative.     Social History   Tobacco Use  . Smoking status: Never Smoker  . Smokeless tobacco: Never Used  Substance Use Topics  . Alcohol use: No    Alcohol/week: 0.0 standard drinks      Objective:   BP 138/86 (BP Location: Left Arm, Patient Position: Sitting, Cuff Size: Normal)   Pulse (!) 59   Temp (!) 97.5 F (36.4 C) (Temporal)   Resp 16   Wt 213 lb 6.4 oz (96.8 kg)   SpO2 96%   BMI 26.32 kg/m  Vitals:   07/04/19 1047  BP: 138/86  Pulse: (!) 59  Resp: 16  Temp: (!) 97.5 F (36.4 C)  TempSrc: Temporal  SpO2: 96%  Weight: 213 lb 6.4 oz (96.8 kg)     Physical Exam   General Appearance:    Alert, cooperative, no distress  Eyes:    PERRL, conjunctiva/corneas clear, EOM's intact       Lungs:     Clear to auscultation bilaterally, respirations unlabored, faint LLL crackles. Good air movement.   Heart:    Normal heart rate. Normal rhythm. No murmurs, rubs, or gallops.   MS:   All extremities are intact.   Neurologic:   Awake, alert, oriented x 3. No apparent focal neurological           defect.          Assessment & Plan    1. Pneumonia of left lower lobe due to infectious organism [facility])  Improving on doxycycline. Has 5 more days left on prescription. Will call in 5 days and anticipate extending course if improving.      Lelon Huh, MD  [facility]

## 2019-07-04 NOTE — Telephone Encounter (Signed)
Pt advised.   Thanks,   -Oscar Forman  

## 2019-07-09 ENCOUNTER — Telehealth: Payer: Self-pay | Admitting: Family Medicine

## 2019-07-09 MED ORDER — DOXYCYCLINE HYCLATE 100 MG PO TABS: 100.0000 mg | ORAL_TABLET | Freq: Two times a day (BID) | ORAL | 0 refills | Status: AC

## 2019-07-09 NOTE — Telephone Encounter (Signed)
Pt advised.   Thanks,   -Jisell Majer  

## 2019-07-09 NOTE — Telephone Encounter (Signed)
LMTCB 07/09/2019  Thanks,   -Neeraj Housand  

## 2019-07-09 NOTE — Telephone Encounter (Signed)
Prescription sent for 4 additional days

## 2019-07-09 NOTE — Telephone Encounter (Signed)
Pt reports he is feeling a lot better today.  He is not back to baseline yet.  He would like to extend doxycycline some but maybe not for another full week.  Pt uses [employer].   Please advise.  Thanks,   -Mickel Baas

## 2019-07-09 NOTE — Telephone Encounter (Signed)
Patient was started on doxycycline for pneumonia last week. Please check and make sure he is feeling better. If improving but not completely better we will extend the course of doxy. If not improving then we will change antibiotic.

## 2019-07-11 ENCOUNTER — Other Ambulatory Visit: Payer: Self-pay | Admitting: Family Medicine

## 2019-07-11 DIAGNOSIS — F32A Depression, unspecified: Secondary | ICD-10-CM

## 2019-07-11 DIAGNOSIS — F329 Major depressive disorder, single episode, unspecified: Secondary | ICD-10-CM

## 2019-07-11 DIAGNOSIS — I10 Essential (primary) hypertension: Secondary | ICD-10-CM

## 2019-07-20 ENCOUNTER — Encounter: Payer: Self-pay | Admitting: Emergency Medicine

## 2019-07-20 ENCOUNTER — Emergency Department: Payer: Medicare Other

## 2019-07-20 ENCOUNTER — Emergency Department
Admission: EM | Admit: 2019-07-20 | Discharge: 2019-07-20 | Disposition: A | Discharge status: Home / Self Care | Payer: Medicare Other | Attending: Emergency Medicine | Admitting: Emergency Medicine

## 2019-07-20 ENCOUNTER — Telehealth: Payer: Self-pay

## 2019-07-20 ENCOUNTER — Other Ambulatory Visit: Payer: Self-pay

## 2019-07-20 DIAGNOSIS — R200 Anesthesia of skin: Secondary | ICD-10-CM | POA: Diagnosis not present

## 2019-07-20 DIAGNOSIS — I10 Essential (primary) hypertension: Secondary | ICD-10-CM | POA: Insufficient documentation

## 2019-07-20 DIAGNOSIS — G510 Bell's palsy: Secondary | ICD-10-CM | POA: Diagnosis not present

## 2019-07-20 DIAGNOSIS — Z85828 Personal history of other malignant neoplasm of skin: Secondary | ICD-10-CM | POA: Insufficient documentation

## 2019-07-20 DIAGNOSIS — Z79899 Other long term (current) drug therapy: Secondary | ICD-10-CM | POA: Insufficient documentation

## 2019-07-20 DIAGNOSIS — R29810 Facial weakness: Secondary | ICD-10-CM | POA: Diagnosis present

## 2019-07-20 LAB — COMPREHENSIVE METABOLIC PANEL
ALT: 29 U/L (ref 0–44)
AST: 30 U/L (ref 15–41)
Albumin: 3.9 g/dL (ref 3.5–5.0)
Alkaline Phosphatase: 66 U/L (ref 38–126)
Anion gap: 9 (ref 5–15)
BUN: 21 mg/dL (ref 8–23)
CO2: 27 mmol/L (ref 22–32)
Calcium: 9.6 mg/dL (ref 8.9–10.3)
Chloride: 101 mmol/L (ref 98–111)
Creatinine, Ser: 1.11 mg/dL (ref 0.61–1.24)
GFR calc Af Amer: >60 mL/min (ref >60)
GFR calc non Af Amer: >60 mL/min (ref >60)
Glucose, Bld: 132 mg/dL — ABNORMAL HIGH (ref 70–99)
Potassium: 3.9 mmol/L (ref 3.5–5.1)
Sodium: 137 mmol/L (ref 135–145)
Total Bilirubin: 1.2 mg/dL (ref 0.3–1.2)
Total Protein: 7.1 g/dL (ref 6.5–8.1)

## 2019-07-20 LAB — CBC
HCT: 45.3 % (ref 39.0–52.0)
Hemoglobin: 14.9 g/dL (ref 13.0–17.0)
MCH: 30.8 pg (ref 26.0–34.0)
MCHC: 32.9 g/dL (ref 30.0–36.0)
MCV: 93.8 fL (ref 80.0–100.0)
Platelets: 224 10*3/uL (ref 150–400)
RBC: 4.83 MIL/uL (ref 4.22–5.81)
RDW: 13.9 % (ref 11.5–15.5)
WBC: 7.5 10*3/uL (ref 4.0–10.5)
nRBC: 0 % (ref 0.0–0.2)

## 2019-07-20 LAB — PROTIME-INR
INR: 1 (ref 0.8–1.2)
Prothrombin Time: 13.3 seconds (ref 11.4–15.2)

## 2019-07-20 LAB — DIFFERENTIAL
Abs Immature Granulocytes: 0.01 10*3/uL (ref 0.00–0.07)
Basophils Absolute: 0.1 10*3/uL (ref 0.0–0.1)
Basophils Relative: 1 %
Eosinophils Absolute: 0.1 10*3/uL (ref 0.0–0.5)
Eosinophils Relative: 1 %
Immature Granulocytes: 0 %
Lymphocytes Relative: 59 %
Lymphs Abs: 4.4 10*3/uL — ABNORMAL HIGH (ref 0.7–4.0)
Monocytes Absolute: 0.7 10*3/uL (ref 0.1–1.0)
Monocytes Relative: 9 %
Neutro Abs: 2.2 10*3/uL (ref 1.7–7.7)
Neutrophils Relative %: 30 %

## 2019-07-20 LAB — GLUCOSE, CAPILLARY: Glucose-Capillary: 119 mg/dL — ABNORMAL HIGH (ref 70–99)

## 2019-07-20 LAB — APTT: aPTT: 34 seconds (ref 24–36)

## 2019-07-20 MED ORDER — PREDNISONE 50 MG PO TABS: ORAL_TABLET | ORAL | 0 refills | Status: DC

## 2019-07-20 MED ORDER — VALACYCLOVIR HCL 1 G PO TABS: 1000.0000 mg | ORAL_TABLET | Freq: Three times a day (TID) | ORAL | 0 refills | Status: DC

## 2019-07-20 NOTE — Telephone Encounter (Signed)
Patient states that he is having numbness in his right upper lip that started 5 days ago. He is also experiencing difficulty closing his right eyelid. He would like the nurse to call him back concerning this.

## 2019-07-20 NOTE — ED Triage Notes (Signed)
Pt here with c/o right upper lip numbness that started 5 days ago-resolving now, states his right eye lid yesterday "felt odd" like he wasn't able to fully close it, however, today he is able to close both eyes. Denies slurred speech, no other numbness elsewhere, NAD.

## 2019-07-20 NOTE — Telephone Encounter (Signed)
Patiet reports for the past 5 days he has had numbness on the right upperside of his lip, over the past 48hrs he reported difficulty closing his right eye and some visual disturbance in the right eye but states today it has cleared. Patient denies PMH of trauma to face, bells palsy or stroke. Patient denies, fever, chest pain, shortness of breath, fatigue, headache, muscle ache. I advised patient that there are no more openings today and it might be best to go to Ed for further evaluation but I informed patient that I would address with you first. Please advise. KW

## 2019-07-20 NOTE — Telephone Encounter (Signed)
Patient advised and agrees to go to the ER right now.

## 2019-07-20 NOTE — ED Notes (Signed)
Ambulatory to desk c/o right sided facial numbness and right eye slow to blink X4 days. Pt denies any new numbness in last 4 days. Pt does not show any signs of extremity weakness.

## 2019-07-20 NOTE — ED Notes (Signed)
Pt sitting on the side of the bed in NAD, A&Ox4. Reports numbness to right side of lip but eye drooping has subsided

## 2019-07-20 NOTE — Telephone Encounter (Signed)
He needs to go to ER

## 2019-07-20 NOTE — ED Provider Notes (Signed)
[facility] Emergency Department Provider Note   ____________________________________________   First MD Initiated Contact with Patient 07/20/19 1509     (approximate)  I have reviewed the triage vital signs and the nursing notes.   HISTORY  Chief Complaint Numbness    HPI ADNAN Bender is a 73 y.o. male reports a history of arthritis, hypertension, kidney stones, recent cystoscopy  Patient reports about 3 days ago he noticed that he felt just a little bit of a weakening feeling underneath his right eye, he also noticed he felt like he has a little bit of trouble closing the right eye.  Not associated with any headaches fevers rashes.  Never happened before.  No trouble speaking.  He has not been confused.  He has been able to go to the gym and workout in his arms and legs have been working just fine  No recent illness except she did take antibiotics and got over her pneumonia not long ago but he is much better now.  No loss of taste or smell.  No fevers or chills.  No known COVID exposure, reports he was tested negative once   Past Medical History:  Diagnosis Date   Arthritis    Cancer ([place])    Basal cell carcinoma bilateral arms and left shoulder   Complication of anesthesia    slow to wake up with shoulder surgery   Depression    Enlarged prostate    Herpes simplex    History of kidney stones    History of MRSA infection 2004   skin around eye   Hypertension    Kidney stone    Lumbar stenosis    Migraine headache    PONV (postoperative nausea and vomiting)    Tendonitis of ankle, left     Patient Active Problem List   Diagnosis Date Noted   Spleen enlarged 07/02/2019   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   Depression 03/16/2016   Anxiety 02/11/2016   Nocturia 02/11/2016   Kidney stones 01/19/2016   Left ureteral stone 12/08/2015   Microscopic hematuria 12/08/2015   Hydronephrosis of left kidney 12/08/2015    Renal stone 12/08/2015   History of nephrolithiasis 11/24/2015   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   Basal cell carcinoma of skin 11/18/2015   Onychomycosis of toenail 11/18/2015   Neck pain 11/18/2015   Cellulitis due to MRSA 11/18/2015   Insomnia 05/01/2015   BPH (benign prostatic hyperplasia) 12/14/2012   Headache [id number]   Uncomplicated herpes simplex 05/08/2009   LBP (low back pain) 12/03/2008   Lumbosacral neuritis 12/03/2008   Essential (primary) hypertension 11/09/1999    Past Surgical History:  Procedure Laterality Date   APPENDECTOMY     back injection     BACK SURGERY     CYSTOSCOPY/URETEROSCOPY/HOLMIUM LASER/STENT PLACEMENT Left 12/18/2015   Procedure: CYSTOSCOPY/URETEROSCOPY/HOLMIUM LASER LITHOTRIPSY;  Surgeon: Hollice Espy, MD;  Location: [facility] ORS;  Service: Urology;  Laterality: Left;   LOWER BACK SURGERY  2010   L4-5 fusion   LUMBAR LAMINECTOMY/DECOMPRESSION MICRODISCECTOMY N/A 08/21/2018   Procedure: LUMBAR LAMINECTOMY/DECOMPRESSION MICRODISCECTOMY 1 LEVEL-L3/4;  Surgeon: Deetta Perla, MD;  Location: [facility] ORS;  Service: Neurosurgery;  Laterality: N/A;   NECK SURGERY     Upper neck surgery; with wires placed   SHOULDER SURGERY Right 11/10/2011   arthroscopic surgery . Dr. Tamala Julian, [facility]   TONSILLECTOMY      Prior to Admission medications   Medication Sig Start Date End Date Taking? Authorizing Provider  amLODipine (NORVASC) 2.5  MG tablet TAKE 1 TABLET BY MOUTH IN THE EVENING 07/11/19   Birdie Sons, MD  atenolol-chlorthalidone (TENORETIC) 50-25 MG tablet TAKE 1/2 TABLET BY MOUTH ONCE A DAY 04/27/18   Birdie Sons, MD  butalbital-acetaminophen-caffeine (FIORICET, ESGIC) [phone] MG tablet TAKE 1 OR 2 TABLETS  BY MOUTH EVERY 6 HOURS AS NEEDED FOR HEADACHE 08/15/18   Birdie Sons, MD  diclofenac (VOLTAREN) 50 MG EC tablet TAKE 1 TABLET BY MOUTH TWICE A DAY 01/05/19   Birdie Sons, MD  finasteride (PROSCAR) 5 MG tablet Take  1 tablet (5 mg total) by mouth daily. 04/18/19   McGowan, Larene Beach A, PA-C  latanoprost (XALATAN) 0.005 % ophthalmic solution Place 1 drop into both eyes at bedtime.  12/05/18   [provider]  methocarbamol (ROBAXIN) 500 MG tablet Take 1 tablet (500 mg total) by mouth every 6 (six) hours as needed for muscle spasms. 08/22/18   Marin Olp, PA-C  mirtazapine (REMERON) 30 MG tablet Take 1 tablet (30 mg total) by mouth at bedtime. 05/08/19   Birdie Sons, MD  oxyCODONE (ROXICODONE) 5 MG immediate release tablet Take 1 tablet (5 mg total) by mouth every 4 (four) hours as needed for breakthrough pain. 08/22/18   Marin Olp, PA-C  predniSONE (DELTASONE) 50 MG tablet 1 tab by mouth daily 07/20/19   Delman Kitten, MD  Probiotic Product (DIGESTIVE ADVANTAGE PO) Take 1 tablet by mouth daily.    [provider]  tamsulosin (FLOMAX) 0.4 MG CAPS capsule Take 1 capsule (0.4 mg total) by mouth daily. 04/18/19   McGowan, Hunt Oris, PA-C  valACYclovir (VALTREX) 1000 MG tablet Take 1 tablet (1,000 mg total) by mouth 3 (three) times daily. 07/20/19   Delman Kitten, MD    Allergies Penicillins  Family History  Problem Relation Age of Onset   Depression Son    Diabetes Neg Hx    Cancer Neg Hx    Heart attack Neg Hx    Kidney disease Neg Hx    Prostate cancer Neg Hx    Kidney cancer Neg Hx    Bladder Cancer Neg Hx     Social History Social History   Tobacco Use   Smoking status: Never Smoker   Smokeless tobacco: Never Used  Substance Use Topics   Alcohol use: No    Alcohol/week: 0.0 standard drinks   Drug use: No    Review of Systems Constitutional: No fever/chills Eyes: No visual changes. ENT: No sore throat. Cardiovascular: Denies chest pain. Respiratory: Denies shortness of breath. Gastrointestinal: No abdominal pain.   Genitourinary: Negative for dysuria. Musculoskeletal: Negative for back pain. Skin: Negative for rash. Neurological: Negative for headaches,  areas of focal weakness or numbness except feels just a little weak with closing the right eye.    ____________________________________________   PHYSICAL EXAM:  VITAL SIGNS: ED Triage Vitals  Enc Vitals Group     BP 07/20/19 1252 129/72     Pulse Rate 07/20/19 1252 (!) 57     Resp --      Temp 07/20/19 1252 97.7 F (36.5 C)     Temp Source 07/20/19 1252 Oral     SpO2 07/20/19 1252 97 %     Weight 07/20/19 1252 207 lb (93.9 kg)     Height 07/20/19 1252 6' 3.5" (1.918 m)     Head Circumference --      Peak Flow --      Pain Score 07/20/19 1259 0     Pain  Loc --      Pain Edu? --      Excl. in GC? --     No rashes over the face.  No lesions in internal ear canal on the right.  Constitutional: Alert and oriented. Well appearing and in no acute distress. Eyes: Conjunctivae are normal. Head: Atraumatic. Nose: No congestion/rhinnorhea. Mouth/Throat: Mucous membranes are moist. Neck: No stridor.  Cardiovascular: Normal rate, regular rhythm. Grossly normal heart sounds.  Good peripheral circulation. Respiratory: Normal respiratory effort.  No retractions. Lungs CTAB. Gastrointestinal: Soft and nontender. No distention. Musculoskeletal: No lower extremity tenderness nor edema. Neurologic:  Normal speech and language. No gross focal neurologic deficits are appreciated except for slight weakening of the right facial muscles consistent with cranial nerve VII.  Extraocular movements are normal.  Sensation across the face bilateral is normal.  He has full level of alertness.  5-5 strength in all extremities able to walk without any difficulty.  No ataxia, no nystagmus.  Speech is clear.  Some slight weakness in closing the right eye fully, though he is able to close it but it is slightly weaker compared to the left.  Also he is not able to further over the right brow quite as well as he is on the left side.  He has just the slightest weakening of right-sided facial muscles, no dense  weakening he is able to close his eye and open and close his mouth without difficulty but just slightly weak on the right. Skin:  Skin is warm, dry and intact. No rash noted. Psychiatric: Mood and affect are normal. Speech and behavior are normal.  ____________________________________________   LABS (all labs ordered are listed, but only abnormal results are displayed)  Labs Reviewed  DIFFERENTIAL - Abnormal; Notable for the following components:      Result Value   Lymphs Abs 4.4 (*)    All other components within normal limits  COMPREHENSIVE METABOLIC PANEL - Abnormal; Notable for the following components:   Glucose, Bld 132 (*)    All other components within normal limits  GLUCOSE, CAPILLARY - Abnormal; Notable for the following components:   Glucose-Capillary 119 (*)    All other components within normal limits  PROTIME-INR  APTT  CBC  I-STAT CREATININE, ED  CBG MONITORING, ED   ____________________________________________  EKG  Reviewed entered by me at 1310 Heart rate 60 QRS 90 QTc 420 Normal sinus rhythm, no evidence ischemia or ectopy ____________________________________________  RADIOLOGY  Ct Head Wo Contrast  Result Date: 07/20/2019 CLINICAL DATA:  Facial numbness, right eye visual disturbance EXAM: CT HEAD WITHOUT CONTRAST TECHNIQUE: Contiguous axial images were obtained from the base of the skull through the vertex without intravenous contrast. COMPARISON:  02/27/2012 FINDINGS: Brain: No evidence of acute infarction, hemorrhage, hydrocephalus, extra-axial collection or mass lesion/mass effect. Vascular: No hyperdense vessel or unexpected calcification. Skull: Normal. Negative for fracture or focal lesion. Sinuses/Orbits: No acute finding. Other: None. IMPRESSION: No acute intracranial pathology. No non-contrast CT evidence of acute stroke or hemorrhage. Electronically Signed   By: Eddie Candle M.D.   On: 07/20/2019 13:52      ____________________________________________   PROCEDURES  Procedure(s) performed: None  Procedures  Critical Care performed: No  ____________________________________________   INITIAL IMPRESSION / ASSESSMENT AND PLAN / ED COURSE  Pertinent labs & imaging results that were available during my care of the patient were reviewed by me and considered in my medical decision making (see chart for details).   Patient presents for right-sided facial  weakening.  Examination and CT scan reviewed, appears consistent with Bell's palsy.  Do not see any signs of suggest that this is a central or strokelike process.  No signs of obvious infection, discussed with the patient and he is comfortable with treating with 1 week of Valtrex and brief course of prednisone and close follow-up with his primary.  Return precautions and treatment recommendations and follow-up discussed with the patient who is agreeable with the plan.       ____________________________________________   FINAL CLINICAL IMPRESSION(S) / ED DIAGNOSES  Final diagnoses:  Right-sided Bell's palsy        Note:  This document was prepared using Dragon voice recognition software and may include unintentional dictation errors       Delman Kitten, MD 07/20/19 1615

## 2019-07-25 ENCOUNTER — Other Ambulatory Visit: Payer: Self-pay | Admitting: Family Medicine

## 2019-07-30 DIAGNOSIS — L280 Lichen simplex chronicus: Secondary | ICD-10-CM | POA: Diagnosis not present

## 2019-07-30 DIAGNOSIS — R234 Changes in skin texture: Secondary | ICD-10-CM | POA: Diagnosis not present

## 2019-07-30 DIAGNOSIS — L578 Other skin changes due to chronic exposure to nonionizing radiation: Secondary | ICD-10-CM | POA: Diagnosis not present

## 2019-07-30 DIAGNOSIS — L570 Actinic keratosis: Secondary | ICD-10-CM | POA: Diagnosis not present

## 2019-08-03 ENCOUNTER — Encounter: Payer: Self-pay | Admitting: Family Medicine

## 2019-08-03 ENCOUNTER — Ambulatory Visit (INDEPENDENT_AMBULATORY_CARE_PROVIDER_SITE_OTHER): Payer: Medicare Other | Admitting: Family Medicine

## 2019-08-03 ENCOUNTER — Other Ambulatory Visit: Payer: Self-pay

## 2019-08-03 VITALS — BP 128/70 | HR 53 | Temp 97.3°F | Resp 16 | Wt 209.2 lb

## 2019-08-03 DIAGNOSIS — G510 Bell's palsy: Secondary | ICD-10-CM

## 2019-08-03 DIAGNOSIS — Z23 Encounter for immunization: Secondary | ICD-10-CM | POA: Diagnosis not present

## 2019-08-03 NOTE — Patient Instructions (Signed)
.   Please review the attached list of medications and notify my office if there are any errors.   . Please bring all of your medications to every appointment so we can make sure that our medication list is the same as yours.   . It is especially important to get the annual flu vaccine this year. If you haven't had it already, please go to your pharmacy or call the office as soon as possible to schedule you flu shot.  

## 2019-08-03 NOTE — Progress Notes (Signed)
Patient: Brett Bender Male    DOB: 20-Jan-1946   73 y.o.   MRN: [id number] Visit Date: 08/03/2019  Today's Provider: Lelon Huh, MD   Chief Complaint  Patient presents with  . Bell's Palsy   Subjective:     HPI  Follow up ER visit  Patient was seen in ER for weakening of the right eye on 07/20/19. He was treated for right sided bells palsy. Treatment for this included x1week of Valtrex and a brief course of Prednisone. He reports excellent compliance with treatment. He reports this condition is Improved, although not quite back to baseline. He has no other complaints. Has finished both medications with no adverse effects.   ------------------------------------------------------------------------------------   Allergies  Allergen Reactions  . Penicillins Other (See Comments)    Stomach Ache     Current Outpatient Medications:  .  amLODipine (NORVASC) 2.5 MG tablet, TAKE 1 TABLET BY MOUTH IN THE EVENING, Disp: 90 tablet, Rfl: 1 .  atenolol-chlorthalidone (TENORETIC) 50-25 MG tablet, TAKE 1/2 TABLET BY MOUTH ONCE DAILY, Disp: 45 tablet, Rfl: 11 .  butalbital-acetaminophen-caffeine (FIORICET, ESGIC) 50-325-40 MG tablet, TAKE 1 OR 2 TABLETS  BY MOUTH EVERY 6 HOURS AS NEEDED FOR HEADACHE, Disp: 20 tablet, Rfl: 5 .  diclofenac (VOLTAREN) 50 MG EC tablet, TAKE 1 TABLET BY MOUTH TWICE A DAY, Disp: 180 tablet, Rfl: 4 .  finasteride (PROSCAR) 5 MG tablet, Take 1 tablet (5 mg total) by mouth daily., Disp: 90 tablet, Rfl: 3 .  latanoprost (XALATAN) 0.005 % ophthalmic solution, Place 1 drop into both eyes at bedtime. , Disp: , Rfl:  .  methocarbamol (ROBAXIN) 500 MG tablet, Take 1 tablet (500 mg total) by mouth every 6 (six) hours as needed for muscle spasms., Disp: 60 tablet, Rfl: 0 .  mirtazapine (REMERON) 30 MG tablet, Take 1 tablet (30 mg total) by mouth at bedtime., Disp: 30 tablet, Rfl: 12 .  oxyCODONE (ROXICODONE) 5 MG immediate release tablet, Take 1 tablet (5 mg  total) by mouth every 4 (four) hours as needed for breakthrough pain., Disp: 30 tablet, Rfl: 0 .  predniSONE (DELTASONE) 50 MG tablet, 1 tab by mouth daily, Disp: 5 tablet, Rfl: 0 .  Probiotic Product (DIGESTIVE ADVANTAGE PO), Take 1 tablet by mouth daily., Disp: , Rfl:  .  tamsulosin (FLOMAX) 0.4 MG CAPS capsule, Take 1 capsule (0.4 mg total) by mouth daily., Disp: 90 capsule, Rfl: 3 .  valACYclovir (VALTREX) 1000 MG tablet, Take 1 tablet (1,000 mg total) by mouth 3 (three) times daily., Disp: 21 tablet, Rfl: 0  Review of Systems  Constitutional: Negative for appetite change, chills and fever.  Respiratory: Negative for chest tightness, shortness of breath and wheezing.   Cardiovascular: Negative for chest pain and palpitations.  Gastrointestinal: Negative for abdominal pain, nausea and vomiting.    Social History   Tobacco Use  . Smoking status: Never Smoker  . Smokeless tobacco: Never Used  Substance Use Topics  . Alcohol use: No    Alcohol/week: 0.0 standard drinks      Objective:    Vitals:   08/03/19 1508 08/03/19 1522  BP: (!) 143/71 128/70  Pulse: (!) 53   Resp: 16   Temp: (!) 97.3 F (36.3 C)   TempSrc: Oral   Weight: 209 lb 3.2 oz (94.9 kg)   There is no height or weight on file to calculate BMI.   Physical Exam   General Appearance:    Alert, cooperative, no  distress  Eyes:    PERRL, conjunctiva/corneas clear, EOM's intact       Lungs:     Clear to auscultation bilaterally, respirations unlabored  Heart:    Bradycardic. Normal rhythm. No murmurs, rubs, or gallops.   MS:   All extremities are intact.   Neurologic:   Awake, alert, oriented x 3. Fascial motor nerves +5, but slightly diminished right eyelid strength and very subtle right face droop.           Assessment & Plan    1. Bell's palsy Completely valacyclovir and prednisone and greatly improved.   2. Need for influenza vaccination  - Flu Vaccine QUAD High Dose(Fluad)  The entirety of the  information documented in the History of Present Illness, Review of Systems and Physical Exam were personally obtained by me. Portions of this information were initially documented by Minette Headland, CMA and reviewed by me for thoroughness and accuracy.      Lelon Huh, MD  [facility]

## 2019-08-07 DIAGNOSIS — H401131 Primary open-angle glaucoma, bilateral, mild stage: Secondary | ICD-10-CM | POA: Diagnosis not present

## 2019-08-14 ENCOUNTER — Ambulatory Visit: Payer: Medicare Other

## 2019-10-08 ENCOUNTER — Other Ambulatory Visit: Payer: Self-pay | Admitting: Family Medicine

## 2019-10-08 DIAGNOSIS — F329 Major depressive disorder, single episode, unspecified: Secondary | ICD-10-CM

## 2019-10-08 DIAGNOSIS — F32A Depression, unspecified: Secondary | ICD-10-CM

## 2019-10-08 DIAGNOSIS — I10 Essential (primary) hypertension: Secondary | ICD-10-CM

## 2019-10-15 DIAGNOSIS — H401131 Primary open-angle glaucoma, bilateral, mild stage: Secondary | ICD-10-CM | POA: Diagnosis not present

## 2019-10-15 DIAGNOSIS — H2513 Age-related nuclear cataract, bilateral: Secondary | ICD-10-CM | POA: Diagnosis not present

## 2019-11-19 ENCOUNTER — Telehealth: Payer: Self-pay

## 2019-11-19 NOTE — Telephone Encounter (Signed)
Patient would like to know if your office will be receiving the vaccine at some point. Please advise

## 2019-11-20 NOTE — Telephone Encounter (Signed)
Patient advised that we do not know when we may receive the vaccine, and to follow the health department for further information.

## 2019-11-21 ENCOUNTER — Other Ambulatory Visit: Payer: Self-pay | Admitting: Sports Medicine

## 2019-11-21 DIAGNOSIS — S59902A Unspecified injury of left elbow, initial encounter: Secondary | ICD-10-CM | POA: Diagnosis not present

## 2019-11-21 DIAGNOSIS — S46312A Strain of muscle, fascia and tendon of triceps, left arm, initial encounter: Secondary | ICD-10-CM | POA: Diagnosis not present

## 2019-11-21 DIAGNOSIS — M778 Other enthesopathies, not elsewhere classified: Secondary | ICD-10-CM | POA: Diagnosis not present

## 2019-11-21 DIAGNOSIS — M7022 Olecranon bursitis, left elbow: Secondary | ICD-10-CM

## 2019-11-21 DIAGNOSIS — M25522 Pain in left elbow: Secondary | ICD-10-CM | POA: Diagnosis not present

## 2019-11-23 ENCOUNTER — Ambulatory Visit
Admission: RE | Admit: 2019-11-23 | Discharge: 2019-11-23 | Disposition: A | Discharge status: Home / Self Care | Payer: Medicare Other | Source: Ambulatory Visit | Attending: Sports Medicine | Admitting: Sports Medicine

## 2019-11-23 ENCOUNTER — Other Ambulatory Visit: Payer: Self-pay

## 2019-11-23 DIAGNOSIS — S59902A Unspecified injury of left elbow, initial encounter: Secondary | ICD-10-CM | POA: Diagnosis not present

## 2019-11-23 DIAGNOSIS — S46312A Strain of muscle, fascia and tendon of triceps, left arm, initial encounter: Secondary | ICD-10-CM | POA: Diagnosis not present

## 2019-11-23 DIAGNOSIS — M7022 Olecranon bursitis, left elbow: Secondary | ICD-10-CM

## 2019-11-28 ENCOUNTER — Encounter: Payer: Self-pay | Admitting: Orthopedic Surgery

## 2019-11-28 ENCOUNTER — Other Ambulatory Visit: Payer: Self-pay

## 2019-11-28 DIAGNOSIS — S46312A Strain of muscle, fascia and tendon of triceps, left arm, initial encounter: Secondary | ICD-10-CM | POA: Diagnosis not present

## 2019-11-29 ENCOUNTER — Other Ambulatory Visit: Payer: Self-pay | Admitting: Orthopedic Surgery

## 2019-11-30 ENCOUNTER — Other Ambulatory Visit
Admission: RE | Admit: 2019-11-30 | Discharge: 2019-11-30 | Disposition: A | Discharge status: Home / Self Care | Payer: Medicare Other | Source: Ambulatory Visit | Attending: Orthopedic Surgery | Admitting: Orthopedic Surgery

## 2019-11-30 ENCOUNTER — Other Ambulatory Visit: Payer: Self-pay

## 2019-11-30 DIAGNOSIS — Z20822 Contact with and (suspected) exposure to covid-19: Secondary | ICD-10-CM | POA: Insufficient documentation

## 2019-11-30 DIAGNOSIS — Z01812 Encounter for preprocedural laboratory examination: Secondary | ICD-10-CM | POA: Diagnosis not present

## 2019-11-30 LAB — SARS CORONAVIRUS 2 (TAT 6-24 HRS): SARS Coronavirus 2: NEGATIVE

## 2019-12-03 NOTE — Anesthesia Preprocedure Evaluation (Addendum)
Anesthesia Evaluation  Patient identified by MRN, date of birth, ID band Patient awake    Reviewed: Allergy & Precautions, NPO status , Patient's Chart, lab work & pertinent test results  History of Anesthesia Complications (+) PONV and history of anesthetic complications  Airway Mallampati: II  TM Distance: >3 FB Neck ROM: Full    Dental   Pulmonary    breath sounds clear to auscultation       Cardiovascular hypertension, (-) angina(-) DOE  Rhythm:Regular Rate:Normal     Neuro/Psych  Headaches, PSYCHIATRIC DISORDERS Anxiety Depression  Neuromuscular disease (Lumbar stenosis)    GI/Hepatic neg GERD  ,  Endo/Other    Renal/GU Renal disease (Stones)     Musculoskeletal  (+) Arthritis ,   Abdominal   Peds  Hematology   Anesthesia Other Findings Skin cancer  Reproductive/Obstetrics                           Anesthesia Physical Anesthesia Plan  ASA: II  Anesthesia Plan: General   Post-op Pain Management:  Regional for Post-op pain   Induction: Intravenous  PONV Risk Score and Plan: 3 and Treatment may vary due to age or medical condition, Ondansetron, Dexamethasone and Midazolam  Airway Management Planned: Nasal Cannula  Additional Equipment:   Intra-op Plan:   Post-operative Plan: Extubation in OR  Informed Consent: I have reviewed the patients History and Physical, chart, labs and discussed the procedure including the risks, benefits and alternatives for the proposed anesthesia with the patient or authorized representative who has indicated his/her understanding and acceptance.       Plan Discussed with: CRNA and Anesthesiologist  Anesthesia Plan Comments:        Anesthesia Quick Evaluation

## 2019-12-04 ENCOUNTER — Ambulatory Visit
Admission: RE | Admit: 2019-12-04 | Discharge: 2019-12-04 | Disposition: A | Discharge status: Home / Self Care | Payer: Medicare Other | Attending: Orthopedic Surgery | Admitting: Orthopedic Surgery

## 2019-12-04 ENCOUNTER — Ambulatory Visit: Payer: Medicare Other | Admitting: Anesthesiology

## 2019-12-04 ENCOUNTER — Other Ambulatory Visit: Payer: Self-pay

## 2019-12-04 ENCOUNTER — Encounter: Payer: Self-pay | Admitting: Orthopedic Surgery

## 2019-12-04 ENCOUNTER — Encounter
Admission: RE | Disposition: A | Discharge status: Home / Self Care | Payer: Self-pay | Source: Home / Self Care | Attending: Orthopedic Surgery

## 2019-12-04 DIAGNOSIS — F329 Major depressive disorder, single episode, unspecified: Secondary | ICD-10-CM | POA: Diagnosis not present

## 2019-12-04 DIAGNOSIS — Z79899 Other long term (current) drug therapy: Secondary | ICD-10-CM | POA: Diagnosis not present

## 2019-12-04 DIAGNOSIS — Z791 Long term (current) use of non-steroidal anti-inflammatories (NSAID): Secondary | ICD-10-CM | POA: Insufficient documentation

## 2019-12-04 DIAGNOSIS — M7022 Olecranon bursitis, left elbow: Secondary | ICD-10-CM | POA: Diagnosis not present

## 2019-12-04 DIAGNOSIS — S46312A Strain of muscle, fascia and tendon of triceps, left arm, initial encounter: Secondary | ICD-10-CM | POA: Diagnosis not present

## 2019-12-04 DIAGNOSIS — N400 Enlarged prostate without lower urinary tract symptoms: Secondary | ICD-10-CM | POA: Diagnosis not present

## 2019-12-04 DIAGNOSIS — X500XXA Overexertion from strenuous movement or load, initial encounter: Secondary | ICD-10-CM | POA: Diagnosis not present

## 2019-12-04 DIAGNOSIS — I10 Essential (primary) hypertension: Secondary | ICD-10-CM | POA: Diagnosis not present

## 2019-12-04 DIAGNOSIS — M1990 Unspecified osteoarthritis, unspecified site: Secondary | ICD-10-CM | POA: Diagnosis not present

## 2019-12-04 DIAGNOSIS — Z880 Allergy status to penicillin: Secondary | ICD-10-CM | POA: Diagnosis not present

## 2019-12-04 DIAGNOSIS — Z7982 Long term (current) use of aspirin: Secondary | ICD-10-CM | POA: Diagnosis not present

## 2019-12-04 DIAGNOSIS — M79602 Pain in left arm: Secondary | ICD-10-CM | POA: Diagnosis not present

## 2019-12-04 DIAGNOSIS — Z85828 Personal history of other malignant neoplasm of skin: Secondary | ICD-10-CM | POA: Insufficient documentation

## 2019-12-04 DIAGNOSIS — G8918 Other acute postprocedural pain: Secondary | ICD-10-CM | POA: Diagnosis not present

## 2019-12-04 HISTORY — PX: TRICEPS TENDON REPAIR: SHX2577

## 2019-12-04 HISTORY — DX: Motion sickness, initial encounter: T75.3XXA

## 2019-12-04 SURGERY — REPAIR, TENDON, TRICEPS
Anesthesia: General | Site: Elbow | Laterality: Left

## 2019-12-04 MED ORDER — OXYCODONE HCL 5 MG PO TABS: 5.0000 mg | ORAL_TABLET | ORAL | 0 refills | Status: DC | PRN

## 2019-12-04 MED ORDER — CHLORHEXIDINE GLUCONATE 4 % EX LIQD: 60.0000 mL | Freq: Once | CUTANEOUS | Status: DC

## 2019-12-04 MED ORDER — FENTANYL CITRATE (PF) 100 MCG/2ML IJ SOLN
INTRAMUSCULAR | Status: DC | PRN
  Administered 2019-12-04: 50 ug via INTRAVENOUS

## 2019-12-04 MED ORDER — GLYCOPYRROLATE 0.2 MG/ML IJ SOLN
INTRAMUSCULAR | Status: DC | PRN
  Administered 2019-12-04: .2 mg via INTRAVENOUS

## 2019-12-04 MED ORDER — PHENYLEPHRINE HCL (PRESSORS) 10 MG/ML IV SOLN
INTRAVENOUS | Status: DC | PRN
  Administered 2019-12-04 (×3): 100 ug via INTRAVENOUS

## 2019-12-04 MED ORDER — ACETAMINOPHEN 500 MG PO TABS: 1000.0000 mg | ORAL_TABLET | Freq: Three times a day (TID) | ORAL | 2 refills | Status: AC

## 2019-12-04 MED ORDER — CLINDAMYCIN PHOSPHATE 900 MG/50ML IV SOLN
900.0000 mg | INTRAVENOUS | Status: AC
  Administered 2019-12-04: 900 mg via INTRAVENOUS

## 2019-12-04 MED ORDER — ONDANSETRON 4 MG PO TBDP: 4.0000 mg | ORAL_TABLET | Freq: Three times a day (TID) | ORAL | 0 refills | Status: DC | PRN

## 2019-12-04 MED ORDER — LACTATED RINGERS IV SOLN
100.0000 mL/h | INTRAVENOUS | Status: DC
  Administered 2019-12-04: 100 mL/h via INTRAVENOUS

## 2019-12-04 MED ORDER — MIDAZOLAM HCL 5 MG/5ML IJ SOLN
INTRAMUSCULAR | Status: DC | PRN
  Administered 2019-12-04: 2 mg via INTRAVENOUS
  Administered 2019-12-04: 1 mg via INTRAVENOUS

## 2019-12-04 MED ORDER — SODIUM CHLORIDE 0.9 % IR SOLN
Status: DC | PRN
  Administered 2019-12-04: 500 mL

## 2019-12-04 MED ORDER — ROPIVACAINE HCL 5 MG/ML IJ SOLN
INTRAMUSCULAR | Status: DC | PRN
  Administered 2019-12-04: 40 mL via EPIDURAL

## 2019-12-04 MED ORDER — PROPOFOL 500 MG/50ML IV EMUL
INTRAVENOUS | Status: DC | PRN
  Administered 2019-12-04: 100 ug/kg/min via INTRAVENOUS

## 2019-12-04 MED ORDER — ONDANSETRON HCL 4 MG/2ML IJ SOLN
INTRAMUSCULAR | Status: DC | PRN
  Administered 2019-12-04: 4 mg via INTRAVENOUS

## 2019-12-04 MED ORDER — EPHEDRINE SULFATE 50 MG/ML IJ SOLN
INTRAMUSCULAR | Status: DC | PRN
  Administered 2019-12-04 (×3): 10 mg via INTRAVENOUS

## 2019-12-04 SURGICAL SUPPLY — 51 items
BIT DRILL RIGD1.8MM FBRTK STRL (DRILL) IMPLANT
BLADE SURG 15 STRL LF DISP TIS (BLADE) IMPLANT
BLADE SURG 15 STRL SS (BLADE) ×1
BLADE SURG SZ10 CARB STEEL (BLADE) ×2 IMPLANT
BNDG COHESIVE 4X5 TAN STRL (GAUZE/BANDAGES/DRESSINGS) ×1 IMPLANT
BNDG ELASTIC 4X5.8 VLCR NS LF (GAUZE/BANDAGES/DRESSINGS) ×2 IMPLANT
BNDG ELASTIC 6X5.8 VLCR STR LF (GAUZE/BANDAGES/DRESSINGS) ×1 IMPLANT
BNDG ESMARK 4X12 TAN STRL LF (GAUZE/BANDAGES/DRESSINGS) ×2 IMPLANT
CHLORAPREP W/TINT 26 (MISCELLANEOUS) ×3 IMPLANT
COVER LIGHT HANDLE FLEXIBLE (MISCELLANEOUS) ×4 IMPLANT
COVER WAND RF STERILE (DRAPES) ×1 IMPLANT
CUFF TOURN SGL QUICK 18 (TOURNIQUET CUFF) ×1 IMPLANT
DRAPE SHEET LG 3/4 BI-LAMINATE (DRAPES) ×2 IMPLANT
DRILL RIGID 1.8MM FBRTK STRL (DRILL) ×2
ELECT REM PT RETURN 9FT ADLT (ELECTROSURGICAL) ×4
ELECTRODE REM PT RTRN 9FT ADLT (ELECTROSURGICAL) ×1 IMPLANT
GAUZE SPONGE 4X4 12PLY STRL (GAUZE/BANDAGES/DRESSINGS) ×2 IMPLANT
GAUZE XEROFORM 1X8 LF (GAUZE/BANDAGES/DRESSINGS) ×2 IMPLANT
GLOVE BIO SURGEON STRL SZ7.5 (GLOVE) ×2 IMPLANT
GLOVE BIOGEL PI IND STRL 8 (GLOVE) ×1 IMPLANT
GLOVE BIOGEL PI INDICATOR 8 (GLOVE) ×1
GOWN STRL REIN 2XL XLG LVL4 (GOWN DISPOSABLE) ×2 IMPLANT
GOWN STRL REUS W/ TWL LRG LVL3 (GOWN DISPOSABLE) ×1 IMPLANT
GOWN STRL REUS W/TWL LRG LVL3 (GOWN DISPOSABLE) ×1
KIT TURNOVER KIT A (KITS) ×3 IMPLANT
NDL MAYO CATGUT SZ5 (NEEDLE) ×1
NDL SUT 5 .5 CRC TPR PNT MAYO (NEEDLE) IMPLANT
NS IRRIG 500ML POUR BTL (IV SOLUTION) ×2 IMPLANT
PACK EXTREMITY ARMC (MISCELLANEOUS) ×2 IMPLANT
PAD ABD DERMACEA PRESS 5X9 (GAUZE/BANDAGES/DRESSINGS) ×1 IMPLANT
PAD CAST CTTN 4X4 STRL (SOFTGOODS) ×3 IMPLANT
PADDING CAST COTTON 4X4 STRL (SOFTGOODS) ×1
PASSER SUT SWANSON 36MM LOOP (INSTRUMENTS) ×1 IMPLANT
PENCIL SMOKE EVACUATOR (MISCELLANEOUS) ×2 IMPLANT
SLING ARM LRG DEEP (SOFTGOODS) ×1 IMPLANT
SPLINT CAST 1 STEP 5X30 WHT (MISCELLANEOUS) ×10 IMPLANT
SPLINT FAST PLASTER 5X30 (CAST SUPPLIES) ×1
SPLINT PLASTER CAST FAST 5X30 (CAST SUPPLIES) IMPLANT
SPONGE LAP 18X18 RF (DISPOSABLE) ×3 IMPLANT
STAPLER SKIN PROX 35W (STAPLE) ×2 IMPLANT
STOCKINETTE IMPERVIOUS 9X36 MD (GAUZE/BANDAGES/DRESSINGS) ×1 IMPLANT
SUT 0 TIGERLINK 1.5 FWIRE CLS (SUTURE) ×2
SUT VIC AB 1 CT1 36 (SUTURE) ×1 IMPLANT
SUT VIC AB 2-0 CT2 27 (SUTURE) ×2 IMPLANT
SUTURE 0 TIGRLNK 1.5 FWIR CLS (SUTURE) IMPLANT
SUTURE TAPE 1.3 40 TPR END (SUTURE) IMPLANT
SUTURE TAPE FIBERLINK 1.3 LOOP (SUTURE) IMPLANT
SUTURETAPE 1.3 40 TPR END (SUTURE) ×4
SUTURETAPE FIBERLINK 1.3 LOOP (SUTURE) ×2
SYSTEM IMPL ACL/PCL SWIVILLOCK (Anchor) ×1 IMPLANT
TOWEL OR 17X26 4PK STRL BLUE (TOWEL DISPOSABLE) ×1 IMPLANT

## 2019-12-04 NOTE — Anesthesia Postprocedure Evaluation (Signed)
Anesthesia Post Note  Patient: Brett Bender  Procedure(s) Performed: TRICEPS TENDON REPAIR, OLECRANON BURSA (Left Elbow)     Patient location during evaluation: PACU Anesthesia Type: General Level of consciousness: awake and alert Pain management: pain level controlled Vital Signs Assessment: post-procedure vital signs reviewed and stable Respiratory status: spontaneous breathing, nonlabored ventilation, respiratory function stable and patient connected to nasal cannula oxygen Cardiovascular status: blood pressure returned to baseline and stable Postop Assessment: no apparent nausea or vomiting Anesthetic complications: no    Burkley Dech A  [place]

## 2019-12-04 NOTE — Discharge Instructions (Signed)
Elbow Surgery Post-Op Instructions  1. Splint/Cast: You will have a splint (3/4 cast) on your arm after surgery. Ensure that this remains clean and dry until follow up appointment. If this becomes wet, you need to call our offices to get it changed or else you risk skin breakdown.    2. Driving:  Plan on not driving for at least four to six weeks. Please note that you are advised NOT to drive while taking narcotic pain medications as you may be impaired and unsafe to drive.  3. Activity: Weight bearing: Non-weight bearing. Use sling for comfort as needed.   4. Medications:  - You have been provided a prescription for narcotic pain medicine. After surgery, take 1-2 narcotic tablets every 4 hours if needed for severe pain. Please start this as soon as you begin to start having pain (if you received a nerve block, start taking as soon as this wears off).  - A prescription for anti-nausea medication will be provided in case the narcotic medicine causes nausea - take 1 tablet every 6 hours only if nauseated.  -Take tylenol 1000 mg every 8 hours for pain.  May stop tylenol 5 days after surgery if you are having minimal pain.  If you are taking prescription medication for anxiety, depression, insomnia, muscle spasm, chronic pain, or for attention deficit disorder you are advised that you are at a higher risk of adverse effects with use of narcotics post-op, including narcotic addiction/dependence, depressed breathing, death. If you use non-prescribed substances: alcohol, marijuana, cocaine, heroin, methamphetamines, etc., you are at a higher risk of adverse effects with use of narcotics post-op, including narcotic addiction/dependence, depressed breathing, death. You are advised that taking > 50 morphine milligram equivalents (MME) of narcotic pain medication per day results in twice the risk of overdose or death. For your prescription provided: oxycodone 5 mg - taking more than 6 tablets per day. Be  advised that we will prescribe narcotics short-term, for acute post-operative pain only.  6. Physical Therapy: Plan to start after follow up appointment at 2 weeks. 1-2 times per week for ~12-16 weeks.  The therapist will provide home exercises. Please contact our offices if this appointment has not been scheduled.   7. Work/School: May do light duty/desk job or return to school in approximately 1-2 weeks when off of narcotics, pain is well-controlled, and swelling has decreased. May not return to full work if this includes any lifting for at least 3 months.   8. Post-Op Appointments: Your first post-op appointment will be with Dr. Posey Pronto in approximately 2 weeks time. If given to you, please bring brace to your 1st post-operative appointment.   If you find that they have not been scheduled please call the [facility] front desk at [phone].         General Anesthesia, Adult, Care After This sheet gives you information about how to care for yourself after your procedure. Your health care provider may also give you more specific instructions. If you have problems or questions, contact your health care provider. What can I expect after the procedure? After the procedure, the following side effects are common:  Pain or discomfort at the IV site.  Nausea.  Vomiting.  Sore throat.  Trouble concentrating.  Feeling cold or chills.  Weak or tired.  Sleepiness and fatigue.  Soreness and body aches. These side effects can affect parts of the body that were not involved in surgery. Follow these instructions at home:  For at least 24  hours after the procedure:  Have a responsible adult stay with you. It is important to have someone help care for you until you are awake and alert.  Rest as needed.  Do not: ? Participate in activities in which you could fall or become injured. ? Drive. ? Use heavy machinery. ? Drink alcohol. ? Take sleeping pills or medicines  that cause drowsiness. ? Make important decisions or sign legal documents. ? Take care of children on your own. Eating and drinking  Follow any instructions from your health care provider about eating or drinking restrictions.  When you feel hungry, start by eating small amounts of foods that are soft and easy to digest (bland), such as toast. Gradually return to your regular diet.  Drink enough fluid to keep your urine pale yellow.  If you vomit, rehydrate by drinking water, juice, or clear broth. General instructions  If you have sleep apnea, surgery and certain medicines can increase your risk for breathing problems. Follow instructions from your health care provider about wearing your sleep device: ? Anytime you are sleeping, including during daytime naps. ? While taking prescription pain medicines, sleeping medicines, or medicines that make you drowsy.  Return to your normal activities as told by your health care provider. Ask your health care provider what activities are safe for you.  Take over-the-counter and prescription medicines only as told by your health care provider.  If you smoke, do not smoke without supervision.  Keep all follow-up visits as told by your health care provider. This is important. Contact a health care provider if:  You have nausea or vomiting that does not get better with medicine.  You cannot eat or drink without vomiting.  You have pain that does not get better with medicine.  You are unable to pass urine.  You develop a skin rash.  You have a fever.  You have redness around your IV site that gets worse. Get help right away if:  You have difficulty breathing.  You have chest pain.  You have blood in your urine or stool, or you vomit blood. Summary  After the procedure, it is common to have a sore throat or nausea. It is also common to feel tired.  Have a responsible adult stay with you for the first 24 hours after general  anesthesia. It is important to have someone help care for you until you are awake and alert.  When you feel hungry, start by eating small amounts of foods that are soft and easy to digest (bland), such as toast. Gradually return to your regular diet.  Drink enough fluid to keep your urine pale yellow.  Return to your normal activities as told by your health care provider. Ask your health care provider what activities are safe for you. This information is not intended to replace advice given to you by your health care provider. Make sure you discuss any questions you have with your health care provider. Document Revised: 10/28/2017 Document Reviewed: 06/10/2017 Elsevier Patient Education  [place].

## 2019-12-04 NOTE — Op Note (Signed)
DATE OF SURGERY: 12/04/2019  PRE-OP DIAGNOSIS:  1.  Left Triceps Rupture 2.  Left Olecranon Bursitis   POST-OP DIAGNOSIS:  1.  Left Triceps Rupture 2.  Left Olecranon Bursitis  PROCEDURES:  1. Left Triceps Repair 2.  Left olecranon bursectomy  SURGEON: Cato Mulligan, MD  ASSISTANT(S): none  ANESTHESIA: regional + Gen  TOTAL IV FLUIDS: see anesthesia record  ESTIMATED BLOOD LOSS: 25cc  TOURNIQUET TIME: 51 min  DRAINS:  none  SPECIMENS: None.  IMPLANTS:  Arthrex 4.34mm SwiveLock with 2 SutureTapes  COMPLICATIONS: None apparent.  INDICATIONS: Brett Bender is a 74 y.o. male who approximately 1 month ago was performing overhead triceps extension when he felt a sharp pain over his left posterior elbow. The patient was weak with elbow extension, and on exam, there was a gap in the triceps tendon at the level of the olecranon. MRI confirmed complete tear of the lateral and long heads of the triceps off of the olecranon. The deep medial head was intact.  Additionally, the patient had a significant olecranon bursitis.  Surgery was recommended for triceps repair and olecranon bursectomy to most predictably improve his pain and function long term. After discussion of risks, benefits, and alternatives, the patient agreed to proceed with surgery.  DETAILS OF PROCEDURE:  After a satisfactory left upper extremity regional block and sedation were achieved in the preoperative holding area, the patient was brought to the operating room and placed on the table. Anesthesia was administered. The patient was then transferred to the lateral position on a beanbag. Operative arm was placed in an arm holder so elbow rested at 90 degrees flexion. Arm was prescrubbed with Hibiclens and alcohol, prepped with ChloraPrep and draped in the usual sterile fashion. He was given preoperative IV antibiotics within 30 minutes of the start of the case, and a surgical time-out occurred. A sterile, well-padded  sterile tourniquet was placed.   Arm was elevated, exsanguinated with an Esmarch bandage and tourniquet inflated to 257mmHg. A midline incision was created about the elbow curving laterally to avoid the tip of the olecranon. We sharply dissected down to the level of the olecranon bursa.  There was gross olecranon bursitis and immediate return of clear reddish fluid upon incising the olecranon bursa.  It did not appear infected.  The olecranon bursa was removed in its entirety using combination of a scalpel, electrocautery, and rongeur.  Next, I dissected down to the triceps tendon.  There was a complete tear of the lateral and long heads of the triceps off of the olecranon.  I verified that the deep, medial head was intact.  The footprint of the tendon on the olecranon was cleared of soft tissue with a rongeur. Two #2 Fibertape sutures were passed in a [place] fashion exiting just proximal to the triceps insertion. Two FiberLink loops were passed through the triceps insertion as well to be used as passing sutures. Next, medial and lateral transosseous tunnels were drilled parallel to each other from the triceps footprint on the olecranon distally out of the posterior ulna. We then drilled and tapped for a 4.41mm SwiveLock in between and slightly distal to the two transosseous tunnels.  A suture passer was used to pull the medial [place] with medial FiberLink loop through the medial tunnel. The same thing was done for the lateral tunnel. Next, one medial and one lateral [place] was passed through the medial  Fiberlink loop and shuttled through the medial tunnel. The same was performed for the lateral  tunnel. This created a box configuration to allow for tendon compression at the footprint. The two [place]ow strands out of the medial tunnel were passed through the SwiveLock anchor, and the two [place] strands out of the lateral tunnel were passed through the SwiveLock anchor in the opposite direction. We tensioned the  sutures with the eyelet of the anchor at the previously drilled hole and advanced the anchor until it was flush with the posterior cortex of the ulna.  Anchor placement was done with the elbow in extension.  We then confirmed that excellent reapproximation and fixation were achieved. We were able to achieve 0-75 degrees elbow motion without significant tension on the triceps repair.  The elbow was kept in slight flexion for the rest of the surgery.  Tourniquet was let down and hemostasis was achieved. The subdermal layer was closed with 3-0 vicryl. The skin was closed with staples. The wound was dressed with Xeroform, fluffs, and cotton wrap. A splint was applied .   Instrument, sponge, and needle counts were correct prior to wound closure and at the conclusion of the case. The patient was then awakened from anesthesia without complication   POST-OPERATIVE PLAN: Patient will be discharged to home. The patient will be NWB in splint for 2 weeks and then transitioned to hinged elbow brace.  Follow up in 2 weeks as an outpatient. Start PT after 2 week appointment.Marland Kitchen

## 2019-12-04 NOTE — H&P (Signed)
Paper H&P to be scanned into permanent record. H&P reviewed. No significant changes noted.  

## 2019-12-04 NOTE — Anesthesia Procedure Notes (Signed)
Anesthesia Regional Block: Supraclavicular block   Pre-Anesthetic Checklist: ,, timeout performed, Correct Patient, Correct Site, Correct Laterality, Correct Procedure, Correct Position, site marked, Risks and benefits discussed,  Surgical consent,  Pre-op evaluation,  At surgeon's request and post-op pain management  Laterality: Left  Prep: chloraprep       Needles:  Injection technique: Single-shot  Needle Type: Echogenic Stimulator Needle      Needle Gauge: 21     Additional Needles:   Procedures:,,,, ultrasound used (permanent image in chart),,,,   Nerve Stimulator or Paresthesia:  Response: bicep contraction, 0.45 mA,   Additional Responses:   Narrative:  Injection made incrementally with aspirations every 5 mL.  Performed by: Personally  Anesthesiologist: Hydeia Mcatee, Fredric Dine, MD  Additional Notes: Functioning IV was confirmed and monitors applied.  Sterile prep and drape,hand hygiene and sterile gloves were used.Ultrasound guidance: relevant anatomy identified, needle position confirmed, local anesthetic spread visualized around nerve(s)., vascular puncture avoided.  Image printed for medical record.  Negative aspiration and negative test dose prior to incremental administration of local anesthetic. The patient tolerated the procedure well. Vitals signes recorded in RN notes.

## 2019-12-04 NOTE — Transfer of Care (Signed)
Immediate Anesthesia Transfer of Care Note  Patient: Brett Bender  Procedure(s) Performed: TRICEPS TENDON REPAIR, OLECRANON BURSA (Left Elbow)  Patient Location: PACU  Anesthesia Type: General  Level of Consciousness: awake, alert  and patient cooperative  Airway and Oxygen Therapy: Patient Spontanous Breathing and Patient connected to supplemental oxygen  Post-op Assessment: Post-op Vital signs reviewed, Patient's Cardiovascular Status Stable, Respiratory Function Stable, Patent Airway and No signs of Nausea or vomiting  Post-op Vital Signs: Reviewed and stable  Complications: No apparent anesthesia complications

## 2019-12-05 ENCOUNTER — Encounter: Payer: Self-pay | Admitting: *Deleted

## 2019-12-26 NOTE — Progress Notes (Signed)
Subjective:   Brett Bender is a 74 y.o. male who presents for Medicare Annual/Subsequent preventive examination.    This visit is being conducted through telemedicine due to the COVID-19 pandemic. This patient has given me verbal consent via doximity to conduct this visit, patient states they are participating from their home address. Some vital signs may be absent or patient reported.    Patient identification: identified by name, DOB, and current address  Review of Systems:  N/A  Cardiac Risk Factors include: advanced age (>50men, >35 women);hypertension;male gender     Objective:    Vitals: There were no vitals taken for this visit.  There is no height or weight on file to calculate BMI. Unable to obtain vitals due to visit being conducted via telephonically.   Advanced Directives 12/27/2019 12/04/2019 07/20/2019 12/25/2018 08/21/2018 08/14/2018 12/20/2017  Does Patient Have a Medical Advance Directive? Yes Yes No Yes Yes Yes Yes  Type of Paramedic of [place];Living will Living will - [place];Living will Living will Living will Living will  Does patient want to make changes to medical advance directive? - No - Patient declined - - No - Patient declined No - Patient declined -  Copy of [place] in Chart? Yes - validated most recent copy scanned in chart (See row information) - - Yes - validated most recent copy scanned in chart (See row information) - - -  Would patient like information on creating a medical advance directive? - - - - - - -    Tobacco Social History   Tobacco Use  Smoking Status Never Smoker  Smokeless Tobacco Never Used     Counseling given: Not Answered   Clinical Intake:  Pre-visit preparation completed: Yes  Pain : No/denies pain Pain Score: 0-No pain     Diabetes: No  How often do you need to have someone help you when you read instructions, pamphlets, or other written materials  from your doctor or pharmacy?: 1 - Never  Interpreter Needed?: No  Information entered by :: [facility], LPN  Past Medical History:  Diagnosis Date  . Arthritis   . Cancer ([place])    Basal cell carcinoma bilateral arms and left shoulder  . Complication of anesthesia    slow to wake up with shoulder surgery  . Depression   . Enlarged prostate   . Herpes simplex   . History of kidney stones   . History of MRSA infection 2004   skin around eye  . Hypertension   . Kidney stone   . Lumbar stenosis   . Migraine headache   . Motion sickness    ocean boats  . PONV (postoperative nausea and vomiting)   . Tendonitis of ankle, left    Past Surgical History:  Procedure Laterality Date  . APPENDECTOMY    . back injection    . BACK SURGERY    . CYSTOSCOPY/URETEROSCOPY/HOLMIUM LASER/STENT PLACEMENT Left 12/18/2015   Procedure: CYSTOSCOPY/URETEROSCOPY/HOLMIUM LASER LITHOTRIPSY;  Surgeon: Hollice Espy, MD;  Location: [facility];  Service: Urology;  Laterality: Left;  . ELBOW SURGERY    . LOWER BACK SURGERY  2010   L4-5 fusion  . LUMBAR LAMINECTOMY/DECOMPRESSION MICRODISCECTOMY N/A 08/21/2018   Procedure: LUMBAR LAMINECTOMY/DECOMPRESSION MICRODISCECTOMY 1 LEVEL-L3/4;  Surgeon: Deetta Perla, MD;  Location: [facility];  Service: Neurosurgery;  Laterality: N/A;  . NECK SURGERY     Upper neck surgery; with wires placed  . SHOULDER SURGERY Right 11/10/2011   arthroscopic  surgery . Dr. Tamala Julian, [facility]  . TONSILLECTOMY    . TRICEPS TENDON REPAIR Left 12/04/2019   Procedure: TRICEPS TENDON REPAIR, OLECRANON BURSA;  Surgeon: Leim Fabry, MD;  Location: [place];  Service: Orthopedics;  Laterality: Left;   Family History  Problem Relation Age of Onset  . Depression Son   . Diabetes Neg Hx   . Cancer Neg Hx   . Heart attack Neg Hx   . Kidney disease Neg Hx   . Prostate cancer Neg Hx   . Kidney cancer Neg Hx   . Bladder Cancer Neg Hx    Social History   Socioeconomic History  .  Marital status: Married    Spouse name: Not on file  . Number of children: 2  . Years of education: Not on file  . Highest education level: Associate degree: occupational, Hotel manager, or vocational program  Occupational History  . Occupation: Retired    Comment: former Investment banker, corporate  Tobacco Use  . Smoking status: Never Smoker  . Smokeless tobacco: Never Used  Substance and Sexual Activity  . Alcohol use: No    Alcohol/week: 0.0 standard drinks  . Drug use: No  . Sexual activity: Yes  Other Topics Concern  . Not on file  Social History Narrative  . Not on file   Social Determinants of Health   Financial Resource Strain: Low Risk   . Difficulty of Paying Living Expenses: Not hard at all  Food Insecurity: No Food Insecurity  . Worried About Charity fundraiser in the Last Year: Never true  . Ran Out of Food in the Last Year: Never true  Transportation Needs: No Transportation Needs  . Lack of Transportation (Medical): No  . Lack of Transportation (Non-Medical): No  Physical Activity: Sufficiently Active  . Days of Exercise per Week: 6 days  . Minutes of Exercise per Session: 60 min  Stress: No Stress Concern Present  . Feeling of Stress : Not at all  Social Connections: Slightly Isolated  . Frequency of Communication with Friends and Family: More than three times a week  . Frequency of Social Gatherings with Friends and Family: More than three times a week  . Attends Religious Services: More than 4 times per year  . Active Member of Clubs or Organizations: No  . Attends Archivist Meetings: Never  . Marital Status: Married    Outpatient Encounter Medications as of 12/27/2019  Medication Sig  . acetaminophen (TYLENOL) 500 MG tablet Take 2 tablets (1,000 mg total) by mouth every 8 (eight) hours.  . ALPRAZolam (XANAX) 0.5 MG tablet Take 0.5 mg by mouth at bedtime as needed for anxiety.  Marland Kitchen amLODipine (NORVASC) 2.5 MG tablet TAKE 1 TABLET BY MOUTH ONCE DAILY  IN THEEVENING  . aspirin 81 MG chewable tablet Chew by mouth daily.  Marland Kitchen atenolol-chlorthalidone (TENORETIC) 50-25 MG tablet TAKE 1/2 TABLET BY MOUTH ONCE DAILY  . butalbital-acetaminophen-caffeine (FIORICET, ESGIC) 50-325-40 MG tablet TAKE 1 OR 2 TABLETS  BY MOUTH EVERY 6 HOURS AS NEEDED FOR HEADACHE  . cyclobenzaprine (FLEXERIL) 5 MG tablet Take 5 mg by mouth at bedtime as needed for muscle spasms.  . diclofenac (VOLTAREN) 50 MG EC tablet TAKE 1 TABLET BY MOUTH TWICE A DAY  . diclofenac Sodium (VOLTAREN) 1 % GEL Apply topically 2 (two) times daily as needed.  . finasteride (PROSCAR) 5 MG tablet Take 1 tablet (5 mg total) by mouth daily.  Marland Kitchen latanoprost (XALATAN) 0.005 % ophthalmic solution Place  1 drop into both eyes at bedtime.   . mirtazapine (REMERON) 30 MG tablet Take 1 tablet (30 mg total) by mouth at bedtime.  . Multiple Vitamin (MULTIVITAMIN) tablet Take 1 tablet by mouth daily.  . ondansetron (ZOFRAN ODT) 4 MG disintegrating tablet Take 1 tablet (4 mg total) by mouth every 8 (eight) hours as needed for nausea or vomiting.  . Probiotic Product (DIGESTIVE ADVANTAGE PO) Take 1 tablet by mouth daily.  . psyllium (METAMUCIL) 58.6 % powder Take 1 packet by mouth daily.  . tamsulosin (FLOMAX) 0.4 MG CAPS capsule Take 1 capsule (0.4 mg total) by mouth daily.  . timolol (TIMOPTIC) 0.5 % ophthalmic solution Place 1 drop into both eyes daily.   Marland Kitchen GABAPENTIN PO Take by mouth.  Marland Kitchen HYDROcodone-acetaminophen (NORCO) 10-325 MG tablet Take 1 tablet by mouth every 6 (six) hours as needed.  . methocarbamol (ROBAXIN) 500 MG tablet Take 1 tablet (500 mg total) by mouth every 6 (six) hours as needed for muscle spasms. (Patient not taking: Reported on 11/28/2019)  . mupirocin ointment (BACTROBAN) 2 %   . oxyCODONE (ROXICODONE) 5 MG immediate release tablet Take 1-2 tablets (5-10 mg total) by mouth every 4 (four) hours as needed (pain). (Patient not taking: Reported on 12/27/2019)   No facility-administered  encounter medications on file as of 12/27/2019.    Activities of Daily Living In your present state of health, do you have any difficulty performing the following activities: 12/27/2019 12/04/2019  Hearing? N N  Vision? N N  Difficulty concentrating or making decisions? N N  Walking or climbing stairs? Y N  Comment Occasional weakness when going downstairs. -  Dressing or bathing? N N  Doing errands, shopping? N -  Preparing Food and eating ? N -  Using the Toilet? N -  In the past six months, have you accidently leaked urine? N -  Do you have problems with loss of bowel control? N -  Managing your Medications? N -  Managing your Finances? N -  Housekeeping or managing your Housekeeping? N -  Some recent data might be hidden    Patient Care Team: Birdie Sons, MD as PCP - General (Family Medicine) Laneta Simmers as Physician Assistant (Urology) Odette Fraction as Consulting Physician (Optometry)   Assessment:   This is a routine wellness examination for Brett Bender.  Exercise Activities and Dietary recommendations Current Exercise Habits: Structured exercise class, Type of exercise: stretching;treadmill;walking, Time (Minutes): 60, Frequency (Times/Week): 6, Weekly Exercise (Minutes/Week): 360, Intensity: Moderate, Exercise limited by: None identified  Goals    . Cut out extra servings     Recommend to cut out extra servings after dinner. Advised if going to have a snack to make it healthy, no sweets.        Fall Risk: Fall Risk  12/27/2019 12/25/2018 12/20/2017 12/17/2016 11/19/2015  Falls in the past year? 0 0 No No No  Number falls in past yr: 0 - - - -  Injury with Fall? 0 - - - -    FALL RISK PREVENTION PERTAINING TO THE HOME:  Any stairs in or around the home? Yes  If so, are there any without handrails? No   Home free of loose throw rugs in walkways, pet beds, electrical cords, etc? Yes  Adequate lighting in your home to reduce risk of falls? Yes    ASSISTIVE DEVICES UTILIZED TO PREVENT FALLS:  Life alert? No  Use of a cane, walker or w/c? No  Grab bars in the bathroom? Yes  Shower chair or bench in shower? Yes  Elevated toilet seat or a handicapped toilet? No   TIMED UP AND GO:  Was the test performed? No .    Depression Screen PHQ 2/9 Scores 12/27/2019 12/25/2018 12/20/2017 12/17/2016  PHQ - 2 Score 0 0 0 1  PHQ- 9 Score - - - -    Cognitive Function: Declined today.     6CIT Screen 12/17/2016  What Year? 0 points  What month? 0 points  What time? 0 points  Count back from 20 0 points  Months in reverse 0 points  Repeat phrase 0 points  Total Score 0    Immunization History  Administered Date(s) Administered  . Fluad Quad(high Dose 65+) 08/03/2019  . Influenza Split 09/03/2006, 08/12/2007, 07/26/2008, 08/09/2012  . Influenza, High Dose Seasonal PF 08/10/2014, 08/18/2015, 08/10/2016, 07/28/2017, 08/16/2018  . Influenza,inj,Quad PF,6+ Mos 07/31/2013  . Pneumococcal Conjugate-13 11/27/2014  . Pneumococcal Polysaccharide-23 12/22/2011  . Tdap 12/22/2011  . Zoster 11/26/2011  . Zoster Recombinat (Shingrix) 05/25/2017, 07/28/2017    Qualifies for Shingles Vaccine? Completed series  Tdap: Up to date  Flu Vaccine: Up to date  Pneumococcal Vaccine: Completed series  Screening Tests Health Maintenance  Topic Date Due  . TETANUS/TDAP  12/21/2021  . COLONOSCOPY  11/13/2022  . INFLUENZA VACCINE  Completed  . Hepatitis C Screening  Completed  . PNA vac Low Risk Adult  Completed   Cancer Screenings:  Colorectal Screening: Completed 11/13/12. Repeat every 10 years.  Lung Cancer Screening: (Low Dose CT Chest recommended if Age 31-80 years, 30 pack-year currently smoking OR have quit w/in 15years.) does not qualify.   Additional Screening:  Hepatitis C Screening: Up to date  Vision Screening: Recommended annual ophthalmology exams for early detection of glaucoma and other disorders of the eye.  Dental  Screening: Recommended annual dental exams for proper oral hygiene  Community Resource Referral:  CRR required this visit?  No        Plan:  I have personally reviewed and addressed the Medicare Annual Wellness questionnaire and have noted the following in the patient's chart:  A. Medical and social history B. Use of alcohol, tobacco or illicit drugs  C. Current medications and supplements D. Functional ability and status E.  Nutritional status F.  Physical activity G. Advance directives H. List of other physicians I.  Hospitalizations, surgeries, and ER visits in previous 12 months J.  [place] such as hearing and vision if needed, cognitive and depression L. Referrals and appointments   In addition, I have reviewed and discussed with patient certain preventive protocols, quality metrics, and best practice recommendations. A written personalized care plan for preventive services as well as general preventive health recommendations were provided to patient.   Glendora Score, [place]  [id number] Nurse Health Advisor   Nurse Notes: None.

## 2019-12-27 ENCOUNTER — Ambulatory Visit (INDEPENDENT_AMBULATORY_CARE_PROVIDER_SITE_OTHER): Payer: Medicare Other

## 2019-12-27 ENCOUNTER — Other Ambulatory Visit: Payer: Self-pay

## 2019-12-27 DIAGNOSIS — Z0000 Encounter for general adult medical examination without abnormal findings: Secondary | ICD-10-CM

## 2019-12-27 NOTE — Patient Instructions (Signed)
Brett Bender , Thank you for taking time to come for your Medicare Wellness Visit. I appreciate your ongoing commitment to your health goals. Please review the following plan we discussed and let me know if I can assist you in the future.   Screening recommendations/referrals: Colonoscopy: Up to date, due 11/2022 Recommended yearly ophthalmology/optometry visit for glaucoma screening and checkup Recommended yearly dental visit for hygiene and checkup  Vaccinations: Influenza vaccine: Up to date Pneumococcal vaccine: Completed series Tdap vaccine: Up to date, due 12/2021 Shingles vaccine: Completed series    Advanced directives: Currently due  Conditions/risks identified: Continue to cut out extra snacking after dinner and focus on healthier options.  Next appointment: 01/01/20 @ 10:00 AM with Dr Caryn Section . Deckined scheduling an AWV for 2022 at this time.  Preventive Care 66 Years and Older, Male Preventive care refers to lifestyle choices and visits with your health care provider that can promote health and wellness. What does preventive care include?  A yearly physical exam. This is also called an annual well check.  Dental exams once or twice a year.  Routine eye exams. Ask your health care provider how often you should have your eyes checked.  Personal lifestyle choices, including:  Daily care of your teeth and gums.  Regular physical activity.  Eating a healthy diet.  Avoiding tobacco and drug use.  Limiting alcohol use.  Practicing safe sex.  Taking low doses of aspirin every day.  Taking vitamin and mineral supplements as recommended by your health care provider. What happens during an annual well check? The services and screenings done by your health care provider during your annual well check will depend on your age, overall health, lifestyle risk factors, and family history of disease. Counseling  Your health care provider may ask you questions about  your:  Alcohol use.  Tobacco use.  Drug use.  Emotional well-being.  Home and relationship well-being.  Sexual activity.  Eating habits.  History of falls.  Memory and ability to understand (cognition).  Work and work Statistician. Screening  You may have the following tests or measurements:  Height, weight, and BMI.  Blood pressure.  Lipid and cholesterol levels. These may be checked every 5 years, or more frequently if you are over 76 years old.  Skin check.  Lung cancer screening. You may have this screening every year starting at age 61 if you have a 30-pack-year history of smoking and currently smoke or have quit within the past 15 years.  Fecal occult blood test (FOBT) of the stool. You may have this test every year starting at age 58.  Flexible sigmoidoscopy or colonoscopy. You may have a sigmoidoscopy every 5 years or a colonoscopy every 10 years starting at age 32.  Prostate cancer screening. Recommendations will vary depending on your family history and other risks.  Hepatitis C blood test.  Hepatitis B blood test.  Sexually transmitted disease (STD) testing.  Diabetes screening. This is done by checking your blood sugar (glucose) after you have not eaten for a while (fasting). You may have this done every 1-3 years.  Abdominal aortic aneurysm (AAA) screening. You may need this if you are a current or former smoker.  Osteoporosis. You may be screened starting at age 23 if you are at high risk. Talk with your health care provider about your test results, treatment options, and if necessary, the need for more tests. Vaccines  Your health care provider may recommend certain vaccines, such as:  Influenza vaccine.  This is recommended every year.  Tetanus, diphtheria, and acellular pertussis (Tdap, Td) vaccine. You may need a Td booster every 10 years.  Zoster vaccine. You may need this after age 71.  Pneumococcal 13-valent conjugate (PCV13) vaccine.  One dose is recommended after age 3.  Pneumococcal polysaccharide (PPSV23) vaccine. One dose is recommended after age 31. Talk to your health care provider about which screenings and vaccines you need and how often you need them. This information is not intended to replace advice given to you by your health care provider. Make sure you discuss any questions you have with your health care provider. Document Released: 11/21/2015 Document Revised: 07/14/2016 Document Reviewed: 08/26/2015 Elsevier Interactive Patient Education  2017 [place] Prevention in the Home Falls can cause injuries. They can happen to people of all ages. There are many things you can do to make your home safe and to help prevent falls. What can I do on the outside of my home?  Regularly fix the edges of walkways and driveways and fix any cracks.  Remove anything that might make you trip as you walk through a door, such as a raised step or threshold.  Trim any bushes or trees on the path to your home.  Use bright outdoor lighting.  Clear any walking paths of anything that might make someone trip, such as rocks or tools.  Regularly check to see if handrails are loose or broken. Make sure that both sides of any steps have handrails.  Any raised decks and porches should have guardrails on the edges.  Have any leaves, snow, or ice cleared regularly.  Use sand or salt on walking paths during winter.  Clean up any spills in your garage right away. This includes oil or grease spills. What can I do in the bathroom?  Use night lights.  Install grab bars by the toilet and in the tub and shower. Do not use towel bars as grab bars.  Use non-skid mats or decals in the tub or shower.  If you need to sit down in the shower, use a plastic, non-slip stool.  Keep the floor dry. Clean up any water that spills on the floor as soon as it happens.  Remove soap buildup in the tub or shower regularly.  Attach bath  mats securely with double-sided non-slip rug tape.  Do not have throw rugs and other things on the floor that can make you trip. What can I do in the bedroom?  Use night lights.  Make sure that you have a light by your bed that is easy to reach.  Do not use any sheets or blankets that are too big for your bed. They should not hang down onto the floor.  Have a firm chair that has side arms. You can use this for support while you get dressed.  Do not have throw rugs and other things on the floor that can make you trip. What can I do in the kitchen?  Clean up any spills right away.  Avoid walking on wet floors.  Keep items that you use a lot in easy-to-reach places.  If you need to reach something above you, use a strong step stool that has a grab bar.  Keep electrical cords out of the way.  Do not use floor polish or wax that makes floors slippery. If you must use wax, use non-skid floor wax.  Do not have throw rugs and other things on the floor that can make  you trip. What can I do with my stairs?  Do not leave any items on the stairs.  Make sure that there are handrails on both sides of the stairs and use them. Fix handrails that are broken or loose. Make sure that handrails are as long as the stairways.  Check any carpeting to make sure that it is firmly attached to the stairs. Fix any carpet that is loose or worn.  Avoid having throw rugs at the top or bottom of the stairs. If you do have throw rugs, attach them to the floor with carpet tape.  Make sure that you have a light switch at the top of the stairs and the bottom of the stairs. If you do not have them, ask someone to add them for you. What else can I do to help prevent falls?  Wear shoes that:  Do not have high heels.  Have rubber bottoms.  Are comfortable and fit you well.  Are closed at the toe. Do not wear sandals.  If you use a stepladder:  Make sure that it is fully opened. Do not climb a closed  stepladder.  Make sure that both sides of the stepladder are locked into place.  Ask someone to hold it for you, if possible.  Clearly mark and make sure that you can see:  Any grab bars or handrails.  First and last steps.  Where the edge of each step is.  Use tools that help you move around (mobility aids) if they are needed. These include:  Canes.  Walkers.  Scooters.  Crutches.  Turn on the lights when you go into a dark area. Replace any light bulbs as soon as they burn out.  Set up your furniture so you have a clear path. Avoid moving your furniture around.  If any of your floors are uneven, fix them.  If there are any pets around you, be aware of where they are.  Review your medicines with your doctor. Some medicines can make you feel dizzy. This can increase your chance of falling. Ask your doctor what other things that you can do to help prevent falls. This information is not intended to replace advice given to you by your health care provider. Make sure you discuss any questions you have with your health care provider. Document Released: 08/21/2009 Document Revised: 04/01/2016 Document Reviewed: 11/29/2014 Elsevier Interactive Patient Education  2017 [employer].

## 2020-01-01 ENCOUNTER — Ambulatory Visit (INDEPENDENT_AMBULATORY_CARE_PROVIDER_SITE_OTHER): Payer: Medicare Other | Admitting: Family Medicine

## 2020-01-01 ENCOUNTER — Other Ambulatory Visit: Payer: Self-pay

## 2020-01-01 ENCOUNTER — Ambulatory Visit: Payer: Medicare Other

## 2020-01-01 ENCOUNTER — Encounter: Payer: Self-pay | Admitting: Family Medicine

## 2020-01-01 VITALS — BP 122/75 | HR 71 | Temp 96.8°F | Ht 76.0 in | Wt 218.6 lb

## 2020-01-01 DIAGNOSIS — F32A Depression, unspecified: Secondary | ICD-10-CM

## 2020-01-01 DIAGNOSIS — I10 Essential (primary) hypertension: Secondary | ICD-10-CM | POA: Diagnosis not present

## 2020-01-01 DIAGNOSIS — F329 Major depressive disorder, single episode, unspecified: Secondary | ICD-10-CM | POA: Diagnosis not present

## 2020-01-01 DIAGNOSIS — R197 Diarrhea, unspecified: Secondary | ICD-10-CM

## 2020-01-01 NOTE — Progress Notes (Signed)
Patient: Brett Bender Male    DOB: November 21, 1945   74 y.o.   MRN: [id number] Visit Date: 01/01/2020  Today's Provider: Lelon Huh, MD   Chief Complaint  Patient presents with  . Hypertension  . Anxiety  . Depression   Subjective:    Patient had a AWE with [facility] on 12/27/2019  HPI  Hypertension, follow-up:     BP Readings from Last 3 Encounters:  12/25/18 138/76  12/25/18 138/76  08/22/18 136/81    He was last seen for hypertension 1 year ago.  BP at that visit was 138/76. Management since that visit includes no changes. He reports excellent compliance with treatment. He is not having side effects.  He is exercising. He is not adherent to low salt diet.   Outside blood pressures are not being checked. He is experiencing chest pain, chest pressure/discomfort, dyspnea, exertional chest pressure/discomfort, palpitations and syncope.  Patient denies chest pain, chest pressure/discomfort, dyspnea, exertional chest pressure/discomfort, fatigue, lower extremity edema, near-syncope, palpitations and paroxysmal nocturnal dyspnea.   Cardiovascular risk factors include advanced age (older than 15 for men, 70 for women), hypertension and male gender.  Use of agents associated with hypertension: none.                Weight trend: stable Wt Readings from Last 3 Encounters:  12/25/18 230 lb (104.3 kg)  12/25/18 230 lb 6.4 oz (104.5 kg)  08/21/18 214 lb 15.2 oz (97.5 kg)    Current diet: in general, a "healthy" diet    ----------------------------------------------------------------------  Follow up for Anxiety & Depression:  The patient was last seen for this 1 years ago. Changes made at last visit include no change.  He reports good compliance with treatment. He feels that condition is stable. He is not having side effects.   ------------------------------------------------------------------------------------    Allergies  Allergen Reactions    . Penicillins Other (See Comments)    Stomach Ache     Current Outpatient Medications:  .  acetaminophen (TYLENOL) 500 MG tablet, Take 2 tablets (1,000 mg total) by mouth every 8 (eight) hours., Disp: 90 tablet, Rfl: 2 .  ALPRAZolam (XANAX) 0.5 MG tablet, Take 0.5 mg by mouth at bedtime as needed for anxiety., Disp: , Rfl:  .  amLODipine (NORVASC) 2.5 MG tablet, TAKE 1 TABLET BY MOUTH ONCE DAILY IN THEEVENING, Disp: 90 tablet, Rfl: 1 .  aspirin 81 MG chewable tablet, Chew by mouth daily., Disp: , Rfl:  .  atenolol-chlorthalidone (TENORETIC) 50-25 MG tablet, TAKE 1/2 TABLET BY MOUTH ONCE DAILY, Disp: 45 tablet, Rfl: 11 .  butalbital-acetaminophen-caffeine (FIORICET, ESGIC) 50-325-40 MG tablet, TAKE 1 OR 2 TABLETS  BY MOUTH EVERY 6 HOURS AS NEEDED FOR HEADACHE, Disp: 20 tablet, Rfl: 5 .  cyclobenzaprine (FLEXERIL) 5 MG tablet, Take 5 mg by mouth at bedtime as needed for muscle spasms., Disp: , Rfl:  .  diclofenac (VOLTAREN) 50 MG EC tablet, TAKE 1 TABLET BY MOUTH TWICE A DAY, Disp: 180 tablet, Rfl: 4 .  diclofenac Sodium (VOLTAREN) 1 % GEL, Apply topically 2 (two) times daily as needed., Disp: , Rfl:  .  finasteride (PROSCAR) 5 MG tablet, Take 1 tablet (5 mg total) by mouth daily., Disp: 90 tablet, Rfl: 3 .  GABAPENTIN PO, Take by mouth., Disp: , Rfl:  .  HYDROcodone-acetaminophen (NORCO) 10-325 MG tablet, Take 1 tablet by mouth every 6 (six) hours as needed., Disp: , Rfl:  .  latanoprost (XALATAN) 0.005 % ophthalmic solution,  Place 1 drop into both eyes at bedtime. , Disp: , Rfl:  .  mirtazapine (REMERON) 30 MG tablet, Take 1 tablet (30 mg total) by mouth at bedtime., Disp: 30 tablet, Rfl: 12 .  Multiple Vitamin (MULTIVITAMIN) tablet, Take 1 tablet by mouth daily., Disp: , Rfl:  .  mupirocin ointment (BACTROBAN) 2 %, , Disp: , Rfl:  .  ondansetron (ZOFRAN ODT) 4 MG disintegrating tablet, Take 1 tablet (4 mg total) by mouth every 8 (eight) hours as needed for nausea or vomiting., Disp: 20  tablet, Rfl: 0 .  Probiotic Product (DIGESTIVE ADVANTAGE PO), Take 1 tablet by mouth daily., Disp: , Rfl:  .  psyllium (METAMUCIL) 58.6 % powder, Take 1 packet by mouth daily., Disp: , Rfl:  .  tamsulosin (FLOMAX) 0.4 MG CAPS capsule, Take 1 capsule (0.4 mg total) by mouth daily., Disp: 90 capsule, Rfl: 3 .  timolol (TIMOPTIC) 0.5 % ophthalmic solution, Place 1 drop into both eyes daily. , Disp: , Rfl:   Review of Systems  Constitutional: Negative.   Respiratory: Negative.   Cardiovascular: Negative.   Musculoskeletal: Negative.     Social History   Tobacco Use  . Smoking status: Never Smoker  . Smokeless tobacco: Never Used  Substance Use Topics  . Alcohol use: No    Alcohol/week: 0.0 standard drinks      Objective:   BP 122/75 (BP Location: Right Arm, Patient Position: Sitting, Cuff Size: Large)   Pulse 71   Temp (!) 96.8 F (36 C) (Temporal)   Ht 6\' 4"  (1.93 m)   Wt 218 lb 9.6 oz (99.2 kg)   BMI 26.61 kg/m  Vitals:   01/01/20 0957  BP: 122/75  Pulse: 71  Temp: (!) 96.8 F (36 C)  TempSrc: Temporal  Weight: 218 lb 9.6 oz (99.2 kg)  Height: 6\' 4"  (1.93 m)  Body mass index is 26.61 kg/m.   Physical Exam   General: Appearance:     Overweight male in no acute distress  Eyes:    PERRL, conjunctiva/corneas clear, EOM's intact       Lungs:     Clear to auscultation bilaterally, respirations unlabored  Heart:    Normal heart rate. Normal rhythm. No murmurs, rubs, or gallops.   MS:   All extremities are intact.   Neurologic:   Awake, alert, oriented x 3. No apparent focal neurological           defect.            Assessment & Plan    1. Essential (primary) hypertension Well controlled.  Continue current medications.    2. Depression, unspecified depression type Doing very well on mirtazapine. Continue current medications.    3. Diarrhea, unspecified type Started a few days after getting Covid vaccine and is slowly improving. No blood or mucous in stool  and no abdominal pain and cramp. He is already on pro-biotic which he is to continue, and call if not resolved within the next week.     The entirety of the information documented in the History of Present Illness, Review of Systems and Physical Exam were personally obtained by me. Portions of this information were initially documented by Idelle Jo, CMA and reviewed by me for thoroughness and accuracy.   Lelon Huh, MD  [facility]

## 2020-01-03 ENCOUNTER — Other Ambulatory Visit: Payer: Self-pay

## 2020-01-03 ENCOUNTER — Inpatient Hospital Stay
Admission: EM | Admit: 2020-01-03 | Discharge: 2020-01-09 | DRG: 372 | Disposition: A | Discharge status: Home / Self Care | Payer: Medicare Other | Source: Ambulatory Visit | Attending: Internal Medicine | Admitting: Internal Medicine

## 2020-01-03 ENCOUNTER — Telehealth: Payer: Self-pay | Admitting: Family Medicine

## 2020-01-03 ENCOUNTER — Ambulatory Visit: Payer: Self-pay

## 2020-01-03 ENCOUNTER — Encounter: Payer: Self-pay | Admitting: Emergency Medicine

## 2020-01-03 DIAGNOSIS — Z8614 Personal history of Methicillin resistant Staphylococcus aureus infection: Secondary | ICD-10-CM

## 2020-01-03 DIAGNOSIS — E876 Hypokalemia: Secondary | ICD-10-CM | POA: Diagnosis present

## 2020-01-03 DIAGNOSIS — W19XXXA Unspecified fall, initial encounter: Secondary | ICD-10-CM | POA: Diagnosis not present

## 2020-01-03 DIAGNOSIS — Z87442 Personal history of urinary calculi: Secondary | ICD-10-CM

## 2020-01-03 DIAGNOSIS — E785 Hyperlipidemia, unspecified: Secondary | ICD-10-CM | POA: Diagnosis present

## 2020-01-03 DIAGNOSIS — Z9889 Other specified postprocedural states: Secondary | ICD-10-CM

## 2020-01-03 DIAGNOSIS — Z20822 Contact with and (suspected) exposure to covid-19: Secondary | ICD-10-CM | POA: Diagnosis present

## 2020-01-03 DIAGNOSIS — R17 Unspecified jaundice: Secondary | ICD-10-CM | POA: Diagnosis present

## 2020-01-03 DIAGNOSIS — B370 Candidal stomatitis: Secondary | ICD-10-CM | POA: Diagnosis present

## 2020-01-03 DIAGNOSIS — Z85828 Personal history of other malignant neoplasm of skin: Secondary | ICD-10-CM | POA: Diagnosis not present

## 2020-01-03 DIAGNOSIS — Z981 Arthrodesis status: Secondary | ICD-10-CM | POA: Diagnosis not present

## 2020-01-03 DIAGNOSIS — R350 Frequency of micturition: Secondary | ICD-10-CM | POA: Diagnosis not present

## 2020-01-03 DIAGNOSIS — E860 Dehydration: Secondary | ICD-10-CM | POA: Diagnosis present

## 2020-01-03 DIAGNOSIS — N179 Acute kidney failure, unspecified: Secondary | ICD-10-CM

## 2020-01-03 DIAGNOSIS — R109 Unspecified abdominal pain: Secondary | ICD-10-CM

## 2020-01-03 DIAGNOSIS — Y92231 Patient bathroom in hospital as the place of occurrence of the external cause: Secondary | ICD-10-CM | POA: Diagnosis not present

## 2020-01-03 DIAGNOSIS — S0990XA Unspecified injury of head, initial encounter: Secondary | ICD-10-CM | POA: Diagnosis not present

## 2020-01-03 DIAGNOSIS — R197 Diarrhea, unspecified: Secondary | ICD-10-CM | POA: Diagnosis not present

## 2020-01-03 DIAGNOSIS — Z880 Allergy status to penicillin: Secondary | ICD-10-CM

## 2020-01-03 DIAGNOSIS — A0472 Enterocolitis due to Clostridium difficile, not specified as recurrent: Secondary | ICD-10-CM | POA: Diagnosis present

## 2020-01-03 DIAGNOSIS — F329 Major depressive disorder, single episode, unspecified: Secondary | ICD-10-CM | POA: Diagnosis present

## 2020-01-03 DIAGNOSIS — Z818 Family history of other mental and behavioral disorders: Secondary | ICD-10-CM | POA: Diagnosis not present

## 2020-01-03 DIAGNOSIS — N400 Enlarged prostate without lower urinary tract symptoms: Secondary | ICD-10-CM | POA: Diagnosis present

## 2020-01-03 DIAGNOSIS — N401 Enlarged prostate with lower urinary tract symptoms: Secondary | ICD-10-CM | POA: Diagnosis not present

## 2020-01-03 DIAGNOSIS — I10 Essential (primary) hypertension: Secondary | ICD-10-CM | POA: Diagnosis present

## 2020-01-03 DIAGNOSIS — M1990 Unspecified osteoarthritis, unspecified site: Secondary | ICD-10-CM | POA: Diagnosis present

## 2020-01-03 DIAGNOSIS — Z79899 Other long term (current) drug therapy: Secondary | ICD-10-CM | POA: Diagnosis not present

## 2020-01-03 DIAGNOSIS — Z7982 Long term (current) use of aspirin: Secondary | ICD-10-CM

## 2020-01-03 DIAGNOSIS — R270 Ataxia, unspecified: Secondary | ICD-10-CM | POA: Diagnosis not present

## 2020-01-03 DIAGNOSIS — D72829 Elevated white blood cell count, unspecified: Secondary | ICD-10-CM | POA: Diagnosis not present

## 2020-01-03 LAB — C DIFFICILE QUICK SCREEN W PCR REFLEX
C Diff antigen: POSITIVE — AB
C Diff interpretation: DETECTED
C Diff toxin: POSITIVE — AB

## 2020-01-03 LAB — COMPREHENSIVE METABOLIC PANEL
ALT: 20 U/L (ref 0–44)
AST: 20 U/L (ref 15–41)
Albumin: 3.2 g/dL — ABNORMAL LOW (ref 3.5–5.0)
Alkaline Phosphatase: 84 U/L (ref 38–126)
Anion gap: 9 (ref 5–15)
BUN: 57 mg/dL — ABNORMAL HIGH (ref 8–23)
CO2: 24 mmol/L (ref 22–32)
Calcium: 8.5 mg/dL — ABNORMAL LOW (ref 8.9–10.3)
Chloride: 106 mmol/L (ref 98–111)
Creatinine, Ser: 1.73 mg/dL — ABNORMAL HIGH (ref 0.61–1.24)
GFR calc Af Amer: 44 mL/min — ABNORMAL LOW (ref >60)
GFR calc non Af Amer: 38 mL/min — ABNORMAL LOW (ref >60)
Glucose, Bld: 158 mg/dL — ABNORMAL HIGH (ref 70–99)
Potassium: 3.7 mmol/L (ref 3.5–5.1)
Sodium: 139 mmol/L (ref 135–145)
Total Bilirubin: 2.3 mg/dL — ABNORMAL HIGH (ref 0.3–1.2)
Total Protein: 6.8 g/dL (ref 6.5–8.1)

## 2020-01-03 LAB — URINALYSIS, COMPLETE (UACMP) WITH MICROSCOPIC
Bacteria, UA: NONE SEEN
Bilirubin Urine: NEGATIVE
Glucose, UA: NEGATIVE mg/dL
Hgb urine dipstick: NEGATIVE
Ketones, ur: NEGATIVE mg/dL
Leukocytes,Ua: NEGATIVE
Nitrite: NEGATIVE
Protein, ur: 30 mg/dL — AB
Specific Gravity, Urine: 1.025 (ref 1.005–1.030)
Squamous Epithelial / HPF: NONE SEEN (ref 0–5)
WBC, UA: NONE SEEN WBC/hpf (ref 0–5)
pH: 5 (ref 5.0–8.0)

## 2020-01-03 LAB — CBC
HCT: 43 % (ref 39.0–52.0)
Hemoglobin: 14.5 g/dL (ref 13.0–17.0)
MCH: 31.7 pg (ref 26.0–34.0)
MCHC: 33.7 g/dL (ref 30.0–36.0)
MCV: 94.1 fL (ref 80.0–100.0)
Platelets: 207 10*3/uL (ref 150–400)
RBC: 4.57 MIL/uL (ref 4.22–5.81)
RDW: 13.9 % (ref 11.5–15.5)
WBC: 34.4 10*3/uL — ABNORMAL HIGH (ref 4.0–10.5)
nRBC: 0 % (ref 0.0–0.2)

## 2020-01-03 LAB — LACTIC ACID, PLASMA: Lactic Acid, Venous: 1.2 mmol/L (ref 0.5–1.9)

## 2020-01-03 LAB — LIPASE, BLOOD: Lipase: 12 U/L (ref 11–51)

## 2020-01-03 LAB — SARS CORONAVIRUS 2 (TAT 6-24 HRS): SARS Coronavirus 2: NEGATIVE

## 2020-01-03 MED ORDER — MIRTAZAPINE 15 MG PO TABS
30.0000 mg | ORAL_TABLET | Freq: Every day | ORAL | Status: DC
  Administered 2020-01-03 – 2020-01-08 (×6): 30 mg via ORAL
  Filled 2020-01-03 (×6): qty 2

## 2020-01-03 MED ORDER — ENOXAPARIN SODIUM 40 MG/0.4ML ~~LOC~~ SOLN
40.0000 mg | SUBCUTANEOUS | Status: DC
  Administered 2020-01-03 – 2020-01-08 (×6): 40 mg via SUBCUTANEOUS
  Filled 2020-01-03 (×6): qty 0.4

## 2020-01-03 MED ORDER — ATENOLOL 50 MG PO TABS
50.0000 mg | ORAL_TABLET | Freq: Every day | ORAL | Status: DC
  Administered 2020-01-04 – 2020-01-09 (×5): 50 mg via ORAL
  Filled 2020-01-03 (×6): qty 1

## 2020-01-03 MED ORDER — VANCOMYCIN 50 MG/ML ORAL SOLUTION: 125.0000 mg | Freq: Four times a day (QID) | ORAL | Status: DC

## 2020-01-03 MED ORDER — AMLODIPINE BESYLATE 5 MG PO TABS
2.5000 mg | ORAL_TABLET | Freq: Every evening | ORAL | Status: DC
  Administered 2020-01-03 – 2020-01-08 (×6): 2.5 mg via ORAL
  Filled 2020-01-03 (×6): qty 1

## 2020-01-03 MED ORDER — SODIUM CHLORIDE 0.9 % IV BOLUS
1000.0000 mL | Freq: Once | INTRAVENOUS | Status: AC
  Administered 2020-01-03: 1000 mL via INTRAVENOUS

## 2020-01-03 MED ORDER — FINASTERIDE 5 MG PO TABS
5.0000 mg | ORAL_TABLET | Freq: Every day | ORAL | Status: DC
  Administered 2020-01-04 – 2020-01-09 (×6): 5 mg via ORAL
  Filled 2020-01-03 (×6): qty 1

## 2020-01-03 MED ORDER — ALPRAZOLAM 0.5 MG PO TABS: 0.5000 mg | ORAL_TABLET | Freq: Every evening | ORAL | Status: DC | PRN

## 2020-01-03 MED ORDER — ASPIRIN 81 MG PO CHEW
81.0000 mg | CHEWABLE_TABLET | Freq: Every day | ORAL | Status: DC
  Administered 2020-01-03 – 2020-01-09 (×7): 81 mg via ORAL
  Filled 2020-01-03 (×7): qty 1

## 2020-01-03 MED ORDER — ATENOLOL-CHLORTHALIDONE 50-25 MG PO TABS: 0.5000 | ORAL_TABLET | Freq: Every day | ORAL | Status: DC

## 2020-01-03 MED ORDER — SODIUM CHLORIDE 0.9 % IV SOLN: INTRAVENOUS | Status: DC

## 2020-01-03 MED ORDER — VANCOMYCIN 50 MG/ML ORAL SOLUTION
125.0000 mg | Freq: Four times a day (QID) | ORAL | Status: DC
  Administered 2020-01-03 – 2020-01-09 (×24): 125 mg via ORAL
  Filled 2020-01-03 (×27): qty 2.5

## 2020-01-03 MED ORDER — DICLOFENAC SODIUM 1 % EX GEL
4.0000 g | Freq: Four times a day (QID) | CUTANEOUS | Status: DC
  Administered 2020-01-03 – 2020-01-09 (×19): 4 g via TOPICAL
  Filled 2020-01-03: qty 100

## 2020-01-03 MED ORDER — LACTATED RINGERS IV BOLUS: 1000.0000 mL | Freq: Once | INTRAVENOUS | Status: DC

## 2020-01-03 MED ORDER — TAMSULOSIN HCL 0.4 MG PO CAPS
0.4000 mg | ORAL_CAPSULE | Freq: Every day | ORAL | Status: DC
  Administered 2020-01-03 – 2020-01-08 (×6): 0.4 mg via ORAL
  Filled 2020-01-03 (×6): qty 1

## 2020-01-03 MED ORDER — CHLORTHALIDONE 25 MG PO TABS
25.0000 mg | ORAL_TABLET | Freq: Every day | ORAL | Status: DC
  Administered 2020-01-04 – 2020-01-09 (×5): 25 mg via ORAL
  Filled 2020-01-03 (×6): qty 1

## 2020-01-03 MED ORDER — NYSTATIN 100000 UNIT/ML MT SUSP
5.0000 mL | Freq: Four times a day (QID) | OROMUCOSAL | Status: DC
  Administered 2020-01-03 – 2020-01-09 (×23): 500000 [IU] via ORAL
  Filled 2020-01-03 (×23): qty 5

## 2020-01-03 NOTE — Telephone Encounter (Signed)
Patient went to be seen at ER.

## 2020-01-03 NOTE — H&P (Addendum)
History and Physical    Brett Bender [id number] DOB: 08/12/1946 DOA: 01/03/2020  PCP: Birdie Sons, MD  Patient coming from: Home, lives with wife  I have personally briefly reviewed patient's old medical records in [facility]  Chief Complaint: diarrhea  HPI: Brett Bender is a 74 y.o. male with medical history significant for Hx of hypertension, BPH, and recent tricep tendon repair who presents for concerns of diarrhea for at least the past week.   Diarrhea is non-bloody and could go up to 3 episodes per day.  For the past 2 days he has noticed some lower abdominal pain.Not worse with food.  He also noticed decreased urinary output and has not been able to stay hydrated.  He denies any fevers but has had bad chills not improved with Tylenol.  Denies any nausea vomiting.  He has had decreased appetite.  Denies any sick contact. No travel. Thinks he recently was given antibiotics several weeks ago for his left elbow surgery but I do not see any records of this.   He denies any tobacco, alcohol illicit drug use.  ED Course:   He was afebrile and normotensive on room air. WBC of 34.4K. Creatinine of 1.73 from baseline of 1.10. Elevated total bilirubin of 2.3.   He was started on oral vancomycin in the ED for suspicious of severe C. difficile infection and was given 1 L normal saline bolus for his AKI.  Review of Systems:  Constitutional: No Weight Change, No Fever ENT/Mouth: No sore throat, No Rhinorrhea Eyes: No Eye Pain, No Vision Changes Cardiovascular: No Chest Pain, no SOB Respiratory: No Cough, No Sputum,  Gastrointestinal: No Nausea, No Vomiting, + Diarrhea, No Constipation, + Pain Genitourinary: decrease urine Musculoskeletal: No Arthralgias, No Myalgias Skin: No Skin Lesions, No Pruritus, Neuro: no Weakness, No Numbness Psych: No Anxiety/Panic, No Depression,+ decrease appetite Heme/Lymph: No Bruising, No Bleeding Past Medical History:  Diagnosis Date  .  Arthritis   . Cancer ([place])    Basal cell carcinoma bilateral arms and left shoulder  . Complication of anesthesia    slow to wake up with shoulder surgery  . Depression   . Enlarged prostate   . H/O elbow surgery    left  . Herpes simplex   . History of kidney stones   . History of MRSA infection 2004   skin around eye  . Hypertension   . Kidney stone   . Lumbar stenosis   . Migraine headache   . Motion sickness    ocean boats  . PONV (postoperative nausea and vomiting)   . Tendonitis of ankle, left     Past Surgical History:  Procedure Laterality Date  . APPENDECTOMY    . back injection    . BACK SURGERY    . CYSTOSCOPY/URETEROSCOPY/HOLMIUM LASER/STENT PLACEMENT Left 12/18/2015   Procedure: CYSTOSCOPY/URETEROSCOPY/HOLMIUM LASER LITHOTRIPSY;  Surgeon: Hollice Espy, MD;  Location: [facility] ORS;  Service: Urology;  Laterality: Left;  . ELBOW SURGERY    . LOWER BACK SURGERY  2010   L4-5 fusion  . LUMBAR LAMINECTOMY/DECOMPRESSION MICRODISCECTOMY N/A 08/21/2018   Procedure: LUMBAR LAMINECTOMY/DECOMPRESSION MICRODISCECTOMY 1 LEVEL-L3/4;  Surgeon: Deetta Perla, MD;  Location: [facility] ORS;  Service: Neurosurgery;  Laterality: N/A;  . NECK SURGERY     Upper neck surgery; with wires placed  . SHOULDER SURGERY Right 11/10/2011   arthroscopic surgery . Dr. Tamala Julian, [facility]  . TONSILLECTOMY    . TRICEPS TENDON REPAIR Left 12/04/2019   Procedure: TRICEPS TENDON  REPAIR, OLECRANON BURSA;  Surgeon: Leim Fabry, MD;  Location: [place];  Service: Orthopedics;  Laterality: Left;     reports that he has never smoked. He has never used smokeless tobacco. He reports that he does not drink alcohol or use drugs.  Allergies  Allergen Reactions  . Penicillins Other (See Comments)    Stomach Ache    Family History  Problem Relation Age of Onset  . Depression Son   . Diabetes Neg Hx   . Cancer Neg Hx   . Heart attack Neg Hx   . Kidney disease Neg Hx   . Prostate cancer Neg Hx   .  Kidney cancer Neg Hx   . Bladder Cancer Neg Hx      Prior to Admission medications   Medication Sig Start Date End Date Taking? Authorizing Provider  amLODipine (NORVASC) 2.5 MG tablet TAKE 1 TABLET BY MOUTH ONCE DAILY IN THEEVENING Patient taking differently: Take 2.5 mg by mouth every evening.  10/08/19  Yes Birdie Sons, MD  atenolol-chlorthalidone (TENORETIC) 50-25 MG tablet TAKE 1/2 TABLET BY MOUTH ONCE DAILY Patient taking differently: Take 0.5 tablets by mouth daily.  07/25/19  Yes Birdie Sons, MD  diclofenac (VOLTAREN) 50 MG EC tablet TAKE 1 TABLET BY MOUTH TWICE A DAY Patient taking differently: Take 50 mg by mouth 2 (two) times daily.  01/05/19  Yes Birdie Sons, MD  finasteride (PROSCAR) 5 MG tablet Take 1 tablet (5 mg total) by mouth daily. 04/18/19  Yes McGowan, Larene Beach A, PA-C  latanoprost (XALATAN) 0.005 % ophthalmic solution Place 1 drop into both eyes at bedtime.  12/05/18  Yes [provider]  mirtazapine (REMERON) 30 MG tablet Take 1 tablet (30 mg total) by mouth at bedtime. 05/08/19  Yes Birdie Sons, MD  tamsulosin (FLOMAX) 0.4 MG CAPS capsule Take 1 capsule (0.4 mg total) by mouth daily. 04/18/19  Yes McGowan, Larene Beach A, PA-C  timolol (TIMOPTIC) 0.5 % ophthalmic solution Place 1 drop into both eyes daily.  07/04/19  Yes [provider]  acetaminophen (TYLENOL) 500 MG tablet Take 2 tablets (1,000 mg total) by mouth every 8 (eight) hours. 12/04/19 12/03/20  Leim Fabry, MD  ALPRAZolam Duanne Moron) 0.5 MG tablet Take 0.5 mg by mouth at bedtime as needed for anxiety.    [provider]  aspirin 81 MG chewable tablet Chew by mouth daily.    [provider]  Multiple Vitamin (MULTIVITAMIN) tablet Take 1 tablet by mouth daily.    [provider]  ondansetron (ZOFRAN ODT) 4 MG disintegrating tablet Take 1 tablet (4 mg total) by mouth every 8 (eight) hours as needed for nausea or vomiting. 12/04/19   Leim Fabry, MD  Probiotic  Product (DIGESTIVE ADVANTAGE PO) Take 1 tablet by mouth daily.    [provider]  psyllium (METAMUCIL) 58.6 % powder Take 1 packet by mouth daily.    [provider]    Physical Exam: Vitals:   01/03/20 1339 01/03/20 1340 01/03/20 1730  BP: 136/84  (!) 129/94  Pulse: 66  64  Resp: 18  19  Temp: 97.8 F (36.6 C)    TempSrc: Oral    SpO2: 95%  98%  Weight:  97.5 kg   Height:  6\' 3"  (1.905 m)     Constitutional: NAD, calm, comfortable, nontoxic appearing male sitting upright at 40 degrees incline in bed Vitals:   01/03/20 1339 01/03/20 1340 01/03/20 1730  BP: 136/84  (!) 129/94  Pulse:  66  64  Resp: 18  19  Temp: 97.8 F (36.6 C)    TempSrc: Oral    SpO2: 95%  98%  Weight:  97.5 kg   Height:  6\' 3"  (1.905 m)    Eyes: PERRL, lids and conjunctivae normal ENMT: Mucous membranes are moist. Posterior pharynx clear of any exudate or lesions.  There is a whitish plaque on the tongue.  Neck: normal, supple Respiratory: clear to auscultation bilaterally, no wheezing, no crackles. Normal respiratory effort.  Cardiovascular: Regular rate and rhythm, no murmurs / rubs / gallops. No extremity edema.   Abdomen: Bowel sounds throughout, tenderness worse at left upper quadrant and umbilical region with guarding, no rebound tenderness, no distention. Musculoskeletal: no clubbing / cyanosis. No joint deformity upper and lower extremities. Good ROM, no contractures. Normal muscle tone.  Skin: no rashes, lesions, ulcers. No induration Neurologic: CN 2-12 grossly intact. Sensation intact,  Strength 5/5 in all 4.  Psychiatric: Normal judgment and insight. Alert and oriented x 3. Normal mood.     Labs on Admission: I have personally reviewed following labs and imaging studies  CBC: Recent Labs  Lab 01/03/20 1347  WBC 34.4*  HGB 14.5  HCT 43.0  MCV 94.1  PLT [id number]   Basic Metabolic Panel: Recent Labs  Lab 01/03/20 1347  NA 139  K 3.7  CL 106  CO2 24  GLUCOSE 158*   BUN 57*  CREATININE 1.73*  CALCIUM 8.5*   GFR: Estimated Creatinine Clearance: 45.5 mL/min (A) (by C-G formula based on SCr of 1.73 mg/dL (H)). Liver Function Tests: Recent Labs  Lab 01/03/20 1347  AST 20  ALT 20  ALKPHOS 84  BILITOT 2.3*  PROT 6.8  ALBUMIN 3.2*   Recent Labs  Lab 01/03/20 1347  LIPASE 12   No results for input(s): AMMONIA in the last 168 hours. Coagulation Profile: No results for input(s): INR, PROTIME in the last 168 hours. Cardiac Enzymes: No results for input(s): CKTOTAL, CKMB, CKMBINDEX, TROPONINI in the last 168 hours. BNP (last 3 results) Recent Labs    06/28/19 1440  PROBNP 178   HbA1C: No results for input(s): HGBA1C in the last 72 hours. CBG: No results for input(s): GLUCAP in the last 168 hours. Lipid Profile: No results for input(s): CHOL, HDL, LDLCALC, TRIG, CHOLHDL, LDLDIRECT in the last 72 hours. Thyroid Function Tests: No results for input(s): TSH, T4TOTAL, FREET4, T3FREE, THYROIDAB in the last 72 hours. Anemia Panel: No results for input(s): VITAMINB12, FOLATE, FERRITIN, TIBC, IRON, RETICCTPCT in the last 72 hours. Urine analysis:    Component Value Date/Time   COLORURINE AMBER (A) 01/03/2020 1348   APPEARANCEUR HAZY (A) 01/03/2020 1348   APPEARANCEUR Clear 05/30/2019 1437   LABSPEC 1.025 01/03/2020 1348   LABSPEC 1.008 02/27/2012 1139   PHURINE 5.0 01/03/2020 1348   GLUCOSEU NEGATIVE 01/03/2020 1348   GLUCOSEU Negative 02/27/2012 1139   HGBUR NEGATIVE 01/03/2020 1348   BILIRUBINUR NEGATIVE 01/03/2020 1348   BILIRUBINUR moderate 06/19/2019 0825   BILIRUBINUR Negative 05/30/2019 1437   BILIRUBINUR Negative 02/27/2012 1139   KETONESUR NEGATIVE 01/03/2020 1348   PROTEINUR 30 (A) 01/03/2020 1348   UROBILINOGEN 0.2 06/19/2019 0825   UROBILINOGEN 1.0 04/28/2010 1200   NITRITE NEGATIVE 01/03/2020 [place] 01/03/2020 1348   LEUKOCYTESUR Negative 02/27/2012 1139    Radiological Exams on Admission: No  results found.    Assessment/Plan  Persistent diarrhea concerning for C. difficile infection Continue 4 times daily oral vancomycin given high suspicion  due to leukocytosis, chill, and AKI.  C. difficile testing is pending GI panel pending Continue on clear liquid diet for now  AKI Creatinine of 1.73 from baseline of 1.10 Monitor after fluids Avoid nephrotoxic agent-noted to have twice daily diclofenac on med list  Hyperbilirubinemia 2.3 Suspect due to hypovolemia.  Oral thrush Nystatin suspension   Hypertension Continue amlodipine Continue atenolol-chlorthalidone  BPH Continue Proscar and Flomax    DVT prophylaxis:.Lovenox Code Status: Full Family Communication: Plan discussed with patient at bedside  disposition Plan: Home with at least 2 midnight stays  Consults called:  Admission status: inpatient with at least 2 midnight stay given dehydration and persistent diarrhea concerning for C. difficile infection.  Orene Desanctis DO [facility]   If 7PM-7AM, please contact night-coverage [email or web address]   01/03/2020, 6:12 PM

## 2020-01-03 NOTE — Telephone Encounter (Signed)
Attempted to contact pt to discuss symptoms; left message on voicemail; also pt seen by provider on 01/01/20; will route to office for final disposition.

## 2020-01-03 NOTE — Telephone Encounter (Signed)
Pt called stating that he is still experiencing diarrhea from his appt on 01/01/20. Pt is requesting to have a medication sent in for him. Please advise.   [place] [place], [address]  [place] [address]  Phone: [phone] Fax: [phone]  Not a 24 hour pharmacy; exact hours not known.

## 2020-01-03 NOTE — ED Notes (Signed)
Pt gived a cup for urine sample

## 2020-01-03 NOTE — Telephone Encounter (Signed)
Patient called after being contacted for a request for diarrhea medication. He states that he has had watery loose stools for about a week. He has an average of 2-3 per day. He states that he has constant cramping pain in his lower abdomin and that it is tender to touch. He has had low grade fever. Last was yesterday 99.8. He now states that his mouth is real dry and he feels lightheaded when standing. He states it goes away after he is up. He is trying to drink water. No vomiting.  He states that he feels the diarrhea is effecting his urinary tract. He states he feels urge to go but only has a trickle.  Per protocol patient will go to ER for evaluation. Care advice read to patient. He verbalized understanding.  Reason for Disposition . [1] Drinking very little AND [2] dehydration suspected (e.g., no urine > 12 hours, very dry mouth, very lightheaded)  Answer Assessment - Initial Assessment Questions 1. DIARRHEA SEVERITY: "How bad is the diarrhea?" "How many extra stools have you had in the past 24 hours than normal?"    - NO DIARRHEA (SCALE 0)   - MILD (SCALE 1-3): Few loose or mushy BMs; increase of 1-3 stools over normal daily number of stools; mild increase in ostomy output.   -  MODERATE (SCALE 4-7): Increase of 4-6 stools daily over normal; moderate increase in ostomy output. * SEVERE (SCALE 8-10; OR 'WORST POSSIBLE'): Increase of 7 or more stools daily over normal; moderate increase in ostomy output; incontinence.    2-3 stools per day loose and watery 2. ONSET: "When did the diarrhea begin?"     1 week 3. BM CONSISTENCY: "How loose or watery is the diarrhea?"     loose watrery 4. VOMITING: "Are you also vomiting?" If so, ask: "How many times in the past 24 hours?"      no 5. ABDOMINAL PAIN: "Are you having any abdominal pain?" If yes: "What does it feel like?" (e.g., crampy, dull, intermittent, constant)      Lower crampy constant 6. ABDOMINAL PAIN SEVERITY: If present, ask: "How bad is  the pain?"  (e.g., Scale 1-10; mild, moderate, or severe)   - MILD (1-3): doesn't interfere with normal activities, abdomen soft and not tender to touch    - MODERATE (4-7): interferes with normal activities or awakens from sleep, tender to touch    - SEVERE (8-10): excruciating pain, doubled over, unable to do any normal activities       botton of abdomin tender to touch 7. ORAL INTAKE: If vomiting, "Have you been able to drink liquids?" "How much fluids have you had in the past 24 hours?"    Drinking water Has dry mouth 8. HYDRATION: "Any signs of dehydration?" (e.g., dry mouth [not just dry lips], too weak to stand, dizziness, new weight loss) "When did you last urinate?"    Dry mouth Sometimes when he stand 9. EXPOSURE: "Have you traveled to a foreign country recently?" "Have you been exposed to anyone with diarrhea?" "Could you have eaten any food that was spoiled?"     no 10. ANTIBIOTIC USE: "Are you taking antibiotics now or have you taken antibiotics in the past 2 months?"     no 11. OTHER SYMPTOMS: "Do you have any other symptoms?" (e.g., fever, blood in stool)      Fever 99.8 12. PREGNANCY: "Is there any chance you are pregnant?" "When was your last menstrual period?"  N/A  Protocols used: DIARRHEA-A-AH

## 2020-01-03 NOTE — ED Notes (Signed)
Pt may drink per EDP, pt given sprite, tolerating well. Pt with c/o dry mouth, pt's tongue with white coating.

## 2020-01-03 NOTE — ED Notes (Addendum)
PT states he has to urinate but is not being able to. Bladder scan reveals 24cc urine.  Informed TU, MD.

## 2020-01-03 NOTE — ED Triage Notes (Signed)
Pt sent in by pcp for possible dehydration. Having diarrhea x 7 days - had called into pcp for rx for the diarrhea and they sent him in.

## 2020-01-03 NOTE — ED Provider Notes (Signed)
[facility] Emergency Department Provider Note  ____________________________________________   First MD Initiated Contact with Patient 01/03/20 1646     (approximate)  I have reviewed the triage vital signs and the nursing notes.   HISTORY  Chief Complaint Dehydration    HPI Brett Bender is a 74 y.o. male with depression, kidney stones, hypertension who comes in for diarrhea.  Patient states that has had diarrhea since last Tuesday over 7 days.  Patient states that he has had at least three episodes daily.  He states that it is watery and loose in nature, intermittent, somewhat better with the Imodium he has been taking, nothing makes it worse.  Patient did have a his Covid vaccine a week ago.  Denies any known coronavirus contacts.  Patient states that he has been feeling dehydrated.  He is not urinating as much.  His lips feel dry.  Patient states that he was on antibiotics for 1 to 2 weeks after having a elbow surgery about a month ago.          Past Medical History:  Diagnosis Date   Arthritis    Cancer ([place])    Basal cell carcinoma bilateral arms and left shoulder   Complication of anesthesia    slow to wake up with shoulder surgery   Depression    Enlarged prostate    H/O elbow surgery    left   Herpes simplex    History of kidney stones    History of MRSA infection 2004   skin around eye   Hypertension    Kidney stone    Lumbar stenosis    Migraine headache    Motion sickness    ocean boats   PONV (postoperative nausea and vomiting)    Tendonitis of ankle, left     Patient Active Problem List   Diagnosis Date Noted   Spleen enlarged 07/02/2019   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   Depression 03/16/2016   Anxiety 02/11/2016   Nocturia 02/11/2016   Kidney stones 01/19/2016   Left ureteral stone 12/08/2015   Microscopic hematuria 12/08/2015   Hydronephrosis of left kidney 12/08/2015   Renal stone  12/08/2015   History of nephrolithiasis 11/24/2015   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   Basal cell carcinoma of skin 11/18/2015   Onychomycosis of toenail 11/18/2015   Neck pain 11/18/2015   Cellulitis due to MRSA 11/18/2015   Insomnia 05/01/2015   BPH (benign prostatic hyperplasia) 12/14/2012   Headache [id number]   Uncomplicated herpes simplex 05/08/2009   LBP (low back pain) 12/03/2008   Lumbosacral neuritis 12/03/2008   Essential (primary) hypertension 11/09/1999    Past Surgical History:  Procedure Laterality Date   APPENDECTOMY     back injection     BACK SURGERY     CYSTOSCOPY/URETEROSCOPY/HOLMIUM LASER/STENT PLACEMENT Left 12/18/2015   Procedure: CYSTOSCOPY/URETEROSCOPY/HOLMIUM LASER LITHOTRIPSY;  Surgeon: Hollice Espy, MD;  Location: [facility];  Service: Urology;  Laterality: Left;   ELBOW SURGERY     LOWER BACK SURGERY  2010   L4-5 fusion   LUMBAR LAMINECTOMY/DECOMPRESSION MICRODISCECTOMY N/A 08/21/2018   Procedure: LUMBAR LAMINECTOMY/DECOMPRESSION MICRODISCECTOMY 1 LEVEL-L3/4;  Surgeon: Deetta Perla, MD;  Location: [facility];  Service: Neurosurgery;  Laterality: N/A;   NECK SURGERY     Upper neck surgery; with wires placed   SHOULDER SURGERY Right 11/10/2011   arthroscopic surgery . Dr. Tamala Julian, [place] Left 12/04/2019   Procedure: TRICEPS TENDON REPAIR,  OLECRANON BURSA;  Surgeon: Leim Fabry, MD;  Location: [place];  Service: Orthopedics;  Laterality: Left;    Prior to Admission medications   Medication Sig Start Date End Date Taking? Authorizing Provider  acetaminophen (TYLENOL) 500 MG tablet Take 2 tablets (1,000 mg total) by mouth every 8 (eight) hours. 12/04/19 12/03/20  Leim Fabry, MD  ALPRAZolam Duanne Moron) 0.5 MG tablet Take 0.5 mg by mouth at bedtime as needed for anxiety.    [provider]  amLODipine (NORVASC) 2.5 MG tablet TAKE 1 TABLET BY MOUTH ONCE DAILY IN  THEEVENING 10/08/19   Birdie Sons, MD  aspirin 81 MG chewable tablet Chew by mouth daily.    [provider]  atenolol-chlorthalidone (TENORETIC) 50-25 MG tablet TAKE 1/2 TABLET BY MOUTH ONCE DAILY 07/25/19   Birdie Sons, MD  butalbital-acetaminophen-caffeine (FIORICET, ESGIC) [phone] MG tablet TAKE 1 OR 2 TABLETS  BY MOUTH EVERY 6 HOURS AS NEEDED FOR HEADACHE 08/15/18   Birdie Sons, MD  cyclobenzaprine (FLEXERIL) 5 MG tablet Take 5 mg by mouth at bedtime as needed for muscle spasms.    [provider]  diclofenac (VOLTAREN) 50 MG EC tablet TAKE 1 TABLET BY MOUTH TWICE A DAY 01/05/19   Birdie Sons, MD  diclofenac Sodium (VOLTAREN) 1 % GEL Apply topically 2 (two) times daily as needed.    [provider]  finasteride (PROSCAR) 5 MG tablet Take 1 tablet (5 mg total) by mouth daily. 04/18/19   Zara Council A, PA-C  GABAPENTIN PO Take by mouth.    [provider]  HYDROcodone-acetaminophen (NORCO) 10-325 MG tablet Take 1 tablet by mouth every 6 (six) hours as needed.    [provider]  latanoprost (XALATAN) 0.005 % ophthalmic solution Place 1 drop into both eyes at bedtime.  12/05/18   [provider]  mirtazapine (REMERON) 30 MG tablet Take 1 tablet (30 mg total) by mouth at bedtime. 05/08/19   Birdie Sons, MD  Multiple Vitamin (MULTIVITAMIN) tablet Take 1 tablet by mouth daily.    [provider]  mupirocin ointment (BACTROBAN) 2 %  06/27/19   [provider]  ondansetron (ZOFRAN ODT) 4 MG disintegrating tablet Take 1 tablet (4 mg total) by mouth every 8 (eight) hours as needed for nausea or vomiting. 12/04/19   Leim Fabry, MD  Probiotic Product (DIGESTIVE ADVANTAGE PO) Take 1 tablet by mouth daily.    [provider]  psyllium (METAMUCIL) 58.6 % powder Take 1 packet by mouth daily.    [provider]  tamsulosin (FLOMAX) 0.4 MG CAPS capsule Take 1 capsule (0.4 mg total) by mouth daily.  04/18/19   Zara Council A, PA-C  timolol (TIMOPTIC) 0.5 % ophthalmic solution Place 1 drop into both eyes daily.  07/04/19   [provider]    Allergies Penicillins  Family History  Problem Relation Age of Onset   Depression Son    Diabetes Neg Hx    Cancer Neg Hx    Heart attack Neg Hx    Kidney disease Neg Hx    Prostate cancer Neg Hx    Kidney cancer Neg Hx    Bladder Cancer Neg Hx     Social History Social History   Tobacco Use   Smoking status: Never Smoker   Smokeless tobacco: Never Used  Substance Use Topics   Alcohol use: No    Alcohol/week: 0.0 standard drinks   Drug use: No      Review of  Systems Constitutional: No fever/chills Eyes: No visual changes. ENT: No sore throat.  Dry lips Cardiovascular: Denies chest pain. Respiratory: Denies shortness of breath. Gastrointestinal: No abdominal pain.  No nausea, no vomiting.  Positive diarrhea no constipation. Genitourinary: Negative for dysuria.  Decreased urination Musculoskeletal: Negative for back pain. Skin: Negative for rash. Neurological: Negative for headaches, focal weakness or numbness. All other ROS negative ____________________________________________   PHYSICAL EXAM:  VITAL SIGNS: ED Triage Vitals  Enc Vitals Group     BP 01/03/20 1339 136/84     Pulse Rate 01/03/20 1339 66     Resp 01/03/20 1339 18     Temp 01/03/20 1339 97.8 F (36.6 C)     Temp Source 01/03/20 1339 Oral     SpO2 01/03/20 1339 95 %     Weight 01/03/20 1340 215 lb (97.5 kg)     Height 01/03/20 1340 6\' 3"  (1.905 m)     Head Circumference --      Peak Flow --      Pain Score 01/03/20 1340 0     Pain Loc --      Pain Edu? --      Excl. in [place]? --     Constitutional: Alert and oriented. Well appearing and in no acute distress. Eyes: Conjunctivae are normal. EOMI. Head: Atraumatic. Nose: No congestion/rhinnorhea. Mouth/Throat: Mucous membranes are moist.   Neck: No stridor. Trachea Midline.  FROM Cardiovascular: Normal rate, regular rhythm. Grossly normal heart sounds.  Good peripheral circulation. Respiratory: Normal respiratory effort.  No retractions. Lungs CTAB. Gastrointestinal: Soft and nontender. No distention. No abdominal bruits.  Musculoskeletal: No lower extremity tenderness nor edema.  No joint effusions. Neurologic:  Normal speech and language. No gross focal neurologic deficits are appreciated.  Skin:  Skin is warm, dry and intact. No rash noted. Psychiatric: Mood and affect are normal. Speech and behavior are normal. GU: Deferred   ____________________________________________   LABS (all labs ordered are listed, but only abnormal results are displayed)  Labs Reviewed  COMPREHENSIVE METABOLIC PANEL - Abnormal; Notable for the following components:      Result Value   Glucose, Bld 158 (*)    BUN 57 (*)    Creatinine, Ser 1.73 (*)    Calcium 8.5 (*)    Albumin 3.2 (*)    Total Bilirubin 2.3 (*)    GFR calc non Af Amer 38 (*)    GFR calc Af Amer 44 (*)    All other components within normal limits  CBC - Abnormal; Notable for the following components:   WBC 34.4 (*)    All other components within normal limits  URINALYSIS, COMPLETE (UACMP) WITH MICROSCOPIC - Abnormal; Notable for the following components:   Color, Urine AMBER (*)    APPearance HAZY (*)    Protein, ur 30 (*)    All other components within normal limits  LIPASE, BLOOD   ____________________________________________  INITIAL IMPRESSION / ASSESSMENT AND PLAN / ED COURSE  Brett Bender was evaluated in Emergency Department on 01/03/2020 for the symptoms described in the history of present illness. He was evaluated in the context of the global COVID-19 pandemic, which necessitated consideration that the patient might be at risk for infection with the SARS-CoV-2 virus that causes COVID-19. Institutional protocols and algorithms that pertain to the evaluation of patients at risk for COVID-19 are  in a state of rapid change based on information released by regulatory bodies including the CDC and federal and state organizations. These  policies and algorithms were followed during the patient's care in the ED.    Patient is a 74 year old gentleman who comes in with diarrhea.  Will get labs to evaluate for electrolyte abnormalities, AKI.  Will get urine evaluate for UTI.  I suspect this is most likely C. difficile given patient's recent antibiotic use and significantly elevated white count.  Patient is not septic at this time.  Patient's abdomen is soft and nontender I have low suspicion for toxic megacolon or perforation of his intestines.  At this time will hold off on CT imaging but if patient develops any worsening pain would have low threshold to do CT.  UA no evidence of UTI.  Creatinine is up to 1.73.  Is up from his baseline of 1.1.  White count is significantly elevated at 34.  Given patient meets criteria for severe C. difficile will discuss with the hospital team for admission for further monitoring.  ____________________________________________   FINAL CLINICAL IMPRESSION(S) / ED DIAGNOSES   Final diagnoses:  Diarrhea, unspecified type  AKI (acute kidney injury) ([place])      MEDICATIONS GIVEN DURING THIS VISIT:  Medications  vancomycin (VANCOCIN) 50 mg/mL oral solution 125 mg (has no administration in time range)  sodium chloride 0.9 % bolus 1,000 mL (1,000 mLs Intravenous New Bag/Given 01/03/20 1727)     ED Discharge Orders    None       Note:  This document was prepared using Dragon voice recognition software and may include unintentional dictation errors.   Vanessa , MD 01/03/20 [phone]

## 2020-01-04 ENCOUNTER — Inpatient Hospital Stay: Payer: Medicare Other

## 2020-01-04 DIAGNOSIS — A0472 Enterocolitis due to Clostridium difficile, not specified as recurrent: Secondary | ICD-10-CM | POA: Diagnosis present

## 2020-01-04 LAB — BASIC METABOLIC PANEL
Anion gap: 8 (ref 5–15)
BUN: 59 mg/dL — ABNORMAL HIGH (ref 8–23)
CO2: 22 mmol/L (ref 22–32)
Calcium: 8.1 mg/dL — ABNORMAL LOW (ref 8.9–10.3)
Chloride: 109 mmol/L (ref 98–111)
Creatinine, Ser: 1.58 mg/dL — ABNORMAL HIGH (ref 0.61–1.24)
GFR calc Af Amer: 50 mL/min — ABNORMAL LOW (ref >60)
GFR calc non Af Amer: 43 mL/min — ABNORMAL LOW (ref >60)
Glucose, Bld: 148 mg/dL — ABNORMAL HIGH (ref 70–99)
Potassium: 3.5 mmol/L (ref 3.5–5.1)
Sodium: 139 mmol/L (ref 135–145)

## 2020-01-04 LAB — CBC
HCT: 39.7 % (ref 39.0–52.0)
Hemoglobin: 13.6 g/dL (ref 13.0–17.0)
MCH: 31.6 pg (ref 26.0–34.0)
MCHC: 34.3 g/dL (ref 30.0–36.0)
MCV: 92.3 fL (ref 80.0–100.0)
Platelets: 197 10*3/uL (ref 150–400)
RBC: 4.3 MIL/uL (ref 4.22–5.81)
RDW: 13.7 % (ref 11.5–15.5)
WBC: 30.4 10*3/uL — ABNORMAL HIGH (ref 4.0–10.5)
nRBC: 0 % (ref 0.0–0.2)

## 2020-01-04 LAB — URINALYSIS, COMPLETE (UACMP) WITH MICROSCOPIC
Glucose, UA: NEGATIVE mg/dL
Hgb urine dipstick: NEGATIVE
Ketones, ur: NEGATIVE mg/dL
Leukocytes,Ua: NEGATIVE
Nitrite: NEGATIVE
Protein, ur: 30 mg/dL — AB
Specific Gravity, Urine: 1.021 (ref 1.005–1.030)
Squamous Epithelial / HPF: NONE SEEN (ref 0–5)
pH: 5 (ref 5.0–8.0)

## 2020-01-04 MED ORDER — DICYCLOMINE HCL 20 MG PO TABS
20.0000 mg | ORAL_TABLET | Freq: Three times a day (TID) | ORAL | Status: DC | PRN
  Filled 2020-01-04: qty 1

## 2020-01-04 NOTE — Progress Notes (Signed)
PROGRESS NOTE    Brett Bender  [id number] DOB: 30-Oct-1946 DOA: 01/03/2020 PCP: Birdie Sons, MD   Brief Narrative:  Brett Bender is a 74 y.o. male with medical history significant for Hx of hypertension, BPH, and recent tricep tendon repair who presents for concerns of diarrhea for at least the past week.  Patient currently took antibiotics during his tricep tendon repair surgery.  C. difficile was positive and he was started on p.o. vancomycin.  Subjective: Patient continued to experience some lower abdominal discomfort.  Stating his abdominal pain is improving.  He had 6 watery bowel movements overnight, since this morning they started becoming little formed, stating that it looks like more mushy now.  Remained afebrile.  Assessment & Plan:   Principal Problem:   Diarrhea Active Problems:   Essential (primary) hypertension   BPH (benign prostatic hyperplasia)   AKI (acute kidney injury) ([place])   Hyperbilirubinemia  C. difficile colitis.  Abdominal x-ray without any significant dilation.  Symptoms started improving with p.o. vancomycin.  Still under severe C. difficile infection according to her white cell counts and creatinine level. -Continue p.o. vancomycin-Will need minimum of 10 days. -Continue to monitor.  AKI.  Creatinine peaked at 1.73 with baseline of 1.1.  Started improving, at 1.58 today.  He was also taking twice daily diclofenac at home. -Continue to monitor. -Avoid nephrotoxins.  Hyperbilirubinemia.  T bili of 2.3, most likely secondary to dehydration. -Continue to monitor.  Oral thrush Nystatin suspension   Hypertension Continue amlodipine Continue atenolol-chlorthalidone  BPH Continue Proscar and Flomax  Objective: Vitals:   01/03/20 2017 01/04/20 0510 01/04/20 0934 01/04/20 1235  BP: 112/75 117/78 127/71 132/79  Pulse: 66 73 72 67  Resp: 18 18 18 18   Temp: 97.8 F (36.6 C) 98.1 F (36.7 C) 98.1 F (36.7 C) (!) 97.5 F (36.4 C)   TempSrc:   Oral Oral  SpO2: 96% 93% 96% 98%  Weight:      Height:        Intake/Output Summary (Last 24 hours) at 01/04/2020 1425 Last data filed at 01/04/2020 0534 Gross per 24 hour  Intake 737.02 ml  Output 0 ml  Net 737.02 ml   Filed Weights   01/03/20 1340  Weight: 97.5 kg    Examination:  General exam: Appears calm and comfortable  Respiratory system: Clear to auscultation. Respiratory effort normal. Cardiovascular system: S1 & S2 heard, RRR. No JVD, murmurs, rubs, gallops or clicks. Gastrointestinal system: Soft, mild lower abdominal tenderness, nondistended, bowel sounds positive. Central nervous system: Alert and oriented. No focal neurological deficits.Symmetric 5 x 5 power. Extremities: No edema, no cyanosis, pulses intact and symmetrical. Skin: No rashes, lesions or ulcers Psychiatry: Judgement and insight appear normal. Mood & affect appropriate.    DVT prophylaxis: Lovenox Code Status: Full Family Communication: Wife was updated at bedside. Disposition Plan: Ending improvement, will go back home.  Consultants:   None   Procedures:  Antimicrobials: P.o. vancomycin.  Data Reviewed: I have personally reviewed following labs and imaging studies  CBC: Recent Labs  Lab 01/03/20 1347 01/04/20 0457  WBC 34.4* 30.4*  HGB 14.5 13.6  HCT 43.0 39.7  MCV 94.1 92.3  PLT 207 [id number]   Basic Metabolic Panel: Recent Labs  Lab 01/03/20 1347 01/04/20 0457  NA 139 139  K 3.7 3.5  CL 106 109  CO2 24 22  GLUCOSE 158* 148*  BUN 57* 59*  CREATININE 1.73* 1.58*  CALCIUM 8.5* 8.1*   GFR: Estimated  Creatinine Clearance: 49.8 mL/min (A) (by C-G formula based on SCr of 1.58 mg/dL (H)). Liver Function Tests: Recent Labs  Lab 01/03/20 1347  AST 20  ALT 20  ALKPHOS 84  BILITOT 2.3*  PROT 6.8  ALBUMIN 3.2*   Recent Labs  Lab 01/03/20 1347  LIPASE 12   No results for input(s): AMMONIA in the last 168 hours. Coagulation Profile: No results for input(s):  INR, PROTIME in the last 168 hours. Cardiac Enzymes: No results for input(s): CKTOTAL, CKMB, CKMBINDEX, TROPONINI in the last 168 hours. BNP (last 3 results) Recent Labs    06/28/19 1440  PROBNP 178   HbA1C: No results for input(s): HGBA1C in the last 72 hours. CBG: No results for input(s): GLUCAP in the last 168 hours. Lipid Profile: No results for input(s): CHOL, HDL, LDLCALC, TRIG, CHOLHDL, LDLDIRECT in the last 72 hours. Thyroid Function Tests: No results for input(s): TSH, T4TOTAL, FREET4, T3FREE, THYROIDAB in the last 72 hours. Anemia Panel: No results for input(s): VITAMINB12, FOLATE, FERRITIN, TIBC, IRON, RETICCTPCT in the last 72 hours. Sepsis Labs: Recent Labs  Lab 01/03/20 1723  LATICACIDVEN 1.2    Recent Results (from the past 240 hour(s))  C Difficile Quick Screen w PCR reflex     Status: Abnormal   Collection Time: 01/03/20  5:23 PM   Specimen: Stool  Result Value Ref Range Status   C Diff antigen POSITIVE (A) NEGATIVE Final   C Diff toxin POSITIVE (A) NEGATIVE Final   C Diff interpretation Toxin producing C. difficile detected.  Final    Comment: CRITICAL RESULT CALLED TO, READ BACK BY AND VERIFIED WITH: Verneda Skill 01/03/20 @ 1825  [place] Performed at [facility], [place], [place] [address]   SARS CORONAVIRUS 2 (TAT 6-24 HRS) Nasopharyngeal Nasopharyngeal Swab     Status: None   Collection Time: 01/03/20  5:26 PM   Specimen: Nasopharyngeal Swab  Result Value Ref Range Status   SARS Coronavirus 2 NEGATIVE NEGATIVE Final    Comment: (NOTE) SARS-CoV-2 target nucleic acids are NOT DETECTED. The SARS-CoV-2 RNA is generally detectable in upper and lower respiratory specimens during the acute phase of infection. Negative results do not preclude SARS-CoV-2 infection, do not rule out co-infections with other pathogens, and should not be used as the sole basis for treatment or other patient management decisions. Negative results must be  combined with clinical observations, patient history, and epidemiological information. The expected result is Negative. Fact Sheet for Patients: SugarRoll.be Fact Sheet for Healthcare Providers: https://www.woods-mathews.com/ This test is not yet approved or cleared by the [place] FDA and  has been authorized for detection and/or diagnosis of SARS-CoV-2 by FDA under an Emergency Use Authorization (EUA). This EUA will remain  in effect (meaning this test can be used) for the duration of the COVID-19 declaration under Section 56 4(b)(1) of the Act, 21 U.S.C. section 360bbb-3(b)(1), unless the authorization is terminated or revoked sooner. Performed at [facility], [place] [address], [place], [place] [address]      Radiology Studies: DG Abd 1 View  Result Date: 01/04/2020 CLINICAL DATA:  C diff infection, diarrhea EXAM: ABDOMEN - 1 VIEW COMPARISON:  04/16/2019 FINDINGS: There is air and stool throughout the colon without significant distention. Bowel gas pattern is otherwise unremarkable. No acute osseous abnormality. IMPRESSION: Air and stool throughout the colon without significant distention. Electronically Signed   By: Macy Mis M.D.   On: 01/04/2020 12:01    Scheduled Meds: .  amLODipine  2.5 mg Oral QPM  . aspirin  81 mg Oral Daily  . atenolol  50 mg Oral Daily   And  . chlorthalidone  25 mg Oral Daily  . diclofenac Sodium  4 g Topical QID  . enoxaparin (LOVENOX) injection  40 mg Subcutaneous Q24H  . finasteride  5 mg Oral Daily  . mirtazapine  30 mg Oral QHS  . nystatin  5 mL Oral QID  . tamsulosin  0.4 mg Oral Daily  . vancomycin  125 mg Oral QID   Continuous Infusions: . sodium chloride 75 mL/hr at 01/04/20 1213     LOS: 1 day   Time spent: 40 minutes.  Lorella Nimrod, MD [facility]  If 7PM-7AM, please contact night-coverage [email or web address]  01/04/2020, 2:25 PM   This record has been created using  Systems analyst. Errors have been sought and corrected,but may not always be located. Such creation errors do not reflect on the standard of care.

## 2020-01-05 ENCOUNTER — Inpatient Hospital Stay: Payer: Medicare Other

## 2020-01-05 LAB — COMPREHENSIVE METABOLIC PANEL
ALT: 29 U/L (ref 0–44)
AST: 26 U/L (ref 15–41)
Albumin: 2.6 g/dL — ABNORMAL LOW (ref 3.5–5.0)
Alkaline Phosphatase: 121 U/L (ref 38–126)
Anion gap: 9 (ref 5–15)
BUN: 46 mg/dL — ABNORMAL HIGH (ref 8–23)
CO2: 19 mmol/L — ABNORMAL LOW (ref 22–32)
Calcium: 7.6 mg/dL — ABNORMAL LOW (ref 8.9–10.3)
Chloride: 107 mmol/L (ref 98–111)
Creatinine, Ser: 1.3 mg/dL — ABNORMAL HIGH (ref 0.61–1.24)
GFR calc Af Amer: >60 mL/min (ref >60)
GFR calc non Af Amer: 54 mL/min — ABNORMAL LOW (ref >60)
Glucose, Bld: 158 mg/dL — ABNORMAL HIGH (ref 70–99)
Potassium: 3.3 mmol/L — ABNORMAL LOW (ref 3.5–5.1)
Sodium: 135 mmol/L (ref 135–145)
Total Bilirubin: 4.1 mg/dL — ABNORMAL HIGH (ref 0.3–1.2)
Total Protein: 5.6 g/dL — ABNORMAL LOW (ref 6.5–8.1)

## 2020-01-05 LAB — CBC
HCT: 40.7 % (ref 39.0–52.0)
Hemoglobin: 14.1 g/dL (ref 13.0–17.0)
MCH: 31.6 pg (ref 26.0–34.0)
MCHC: 34.6 g/dL (ref 30.0–36.0)
MCV: 91.3 fL (ref 80.0–100.0)
Platelets: 216 10*3/uL (ref 150–400)
RBC: 4.46 MIL/uL (ref 4.22–5.81)
RDW: 14 % (ref 11.5–15.5)
WBC: 30.2 10*3/uL — ABNORMAL HIGH (ref 4.0–10.5)
nRBC: 0 % (ref 0.0–0.2)

## 2020-01-05 LAB — PROTIME-INR
INR: 1.1 (ref 0.8–1.2)
Prothrombin Time: 14.1 seconds (ref 11.4–15.2)

## 2020-01-05 LAB — MAGNESIUM: Magnesium: 2.6 mg/dL — ABNORMAL HIGH (ref 1.7–2.4)

## 2020-01-05 MED ORDER — PHENAZOPYRIDINE HCL 200 MG PO TABS
200.0000 mg | ORAL_TABLET | Freq: Once | ORAL | Status: AC
  Administered 2020-01-05: 200 mg via ORAL
  Filled 2020-01-05: qty 1

## 2020-01-05 MED ORDER — VANCOMYCIN HCL 500 MG IV SOLR
500.0000 mg | Freq: Four times a day (QID) | Status: DC
  Administered 2020-01-05 – 2020-01-09 (×13): 500 mg via RECTAL
  Filled 2020-01-05 (×19): qty 500

## 2020-01-05 MED ORDER — BIOTENE DRY MOUTH MT LIQD: 15.0000 mL | OROMUCOSAL | Status: DC | PRN

## 2020-01-05 MED ORDER — POTASSIUM CHLORIDE CRYS ER 20 MEQ PO TBCR
40.0000 meq | EXTENDED_RELEASE_TABLET | Freq: Once | ORAL | Status: AC
  Administered 2020-01-05: 40 meq via ORAL
  Filled 2020-01-05: qty 2

## 2020-01-05 MED ORDER — ONDANSETRON HCL 4 MG/2ML IJ SOLN: 4.0000 mg | Freq: Four times a day (QID) | INTRAMUSCULAR | Status: DC | PRN

## 2020-01-05 NOTE — Progress Notes (Signed)
PROGRESS NOTE    Brett Bender  [id number] DOB: 07/23/1946 DOA: 01/03/2020 PCP: Birdie Sons, MD   Brief Narrative:  Brett Bender is a 74 y.o. male with medical history significant for Hx of hypertension, BPH, and recent tricep tendon repair who presents for concerns of diarrhea for at least the past week.  Patient currently took antibiotics during his tricep tendon repair surgery.  C. difficile was positive and he was started on p.o. vancomycin.  Subjective: Patient had a fall overnight while using restroom.  He fell while wiping himself, standing next to commode, hit his face, CT head was negative for any acute injuries.  Neuro assessment was within normal limit. He was having nausea and vomiting since this morning.  His diarrhea got worse overnight, had 7-8 watery bowel movements overnight and 4 since this morning.  Assessment & Plan:   Principal Problem:   Clostridium difficile colitis Active Problems:   Essential (primary) hypertension   BPH (benign prostatic hyperplasia)   Diarrhea   AKI (acute kidney injury) ([place])   Hyperbilirubinemia  C. difficile colitis.  Abdominal x-ray done yesterday was without any significant dilation.  Worsening of symptoms today and development of new nausea and vomiting with mildly worsening abdominal distention.  Stable leukocytosis at 30, creatinine improving with worsening of T bili. Talked with Dr. Talbot Grumbling from ID-we will repeat abdominal x-ray to see any development of megacolon or ileus. -We will try vancomycin enema-500 mg every 6 hourly. -Continue p.o. vancomycin-Will need minimum of 10 days. -Continue to monitor.  AKI.  Creatinine peaked at 1.73 with baseline of 1.1.  Started improving, at 1.58 today.  He was also taking twice daily diclofenac at home. -Continue to monitor. -Avoid nephrotoxins.  Hyperbilirubinemia.  Worsening T bili at 4.1, most likely secondary to dehydration and C. difficile colitis.  Magnesium and INR  within normal range. -Continue to monitor. -Increase IV fluid to 100 mL/h.  Oral thrush Nystatin suspension   Hypertension Continue amlodipine Continue atenolol-chlorthalidone  BPH Continue Proscar and Flomax  Objective: Vitals:   01/05/20 0505 01/05/20 0734 01/05/20 0851 01/05/20 1105  BP: 114/67 130/78 126/71 (!) 130/92  Pulse: 64 65 64 63  Resp: (!) 24 18  20   Temp: 98.3 F (36.8 C) 98.9 F (37.2 C) 98.8 F (37.1 C) 98.3 F (36.8 C)  TempSrc: Oral Oral Oral Oral  SpO2: 94% 95% 96% 99%  Weight:      Height:        Intake/Output Summary (Last 24 hours) at 01/05/2020 1133 Last data filed at 01/04/2020 2201 Gross per 24 hour  Intake --  Output 120 ml  Net -120 ml   Filed Weights   01/03/20 1340  Weight: 97.5 kg    Examination:  General exam: Appears calm and comfortable, having dry heave. Respiratory system: Clear to auscultation. Respiratory effort normal. Cardiovascular system: S1 & S2 heard, RRR. No JVD, murmurs, rubs, gallops or clicks. Gastrointestinal system: Soft, mild lower abdominal tenderness, mildly distended, bowel sounds positive. Central nervous system: Alert and oriented. No focal neurological deficits.Symmetric 5 x 5 power. Extremities: No edema, no cyanosis, pulses intact and symmetrical. Skin: No rashes, lesions or ulcers Psychiatry: Judgement and insight appear normal. Mood & affect appropriate.    DVT prophylaxis: Lovenox Code Status: Full Family Communication: Wife was updated at bedside. Disposition Plan: Ending improvement, will go back home.  Consultants:   None   Procedures:  Antimicrobials: P.o. vancomycin. Vancomycin enema.  Data Reviewed: I have personally  reviewed following labs and imaging studies  CBC: Recent Labs  Lab 01/03/20 1347 01/04/20 0457 01/05/20 0528  WBC 34.4* 30.4* 30.2*  HGB 14.5 13.6 14.1  HCT 43.0 39.7 40.7  MCV 94.1 92.3 91.3  PLT 207 197 [id number]   Basic Metabolic Panel: Recent Labs  Lab  01/03/20 1347 01/04/20 0457 01/05/20 0528 01/05/20 0823  NA 139 139 135  --   K 3.7 3.5 3.3*  --   CL 106 109 107  --   CO2 24 22 19*  --   GLUCOSE 158* 148* 158*  --   BUN 57* 59* 46*  --   CREATININE 1.73* 1.58* 1.30*  --   CALCIUM 8.5* 8.1* 7.6*  --   MG  --   --   --  2.6*   GFR: Estimated Creatinine Clearance: 60.5 mL/min (A) (by C-G formula based on SCr of 1.3 mg/dL (H)). Liver Function Tests: Recent Labs  Lab 01/03/20 1347 01/05/20 0528  AST 20 26  ALT 20 29  ALKPHOS 84 121  BILITOT 2.3* 4.1*  PROT 6.8 5.6*  ALBUMIN 3.2* 2.6*   Recent Labs  Lab 01/03/20 1347  LIPASE 12   No results for input(s): AMMONIA in the last 168 hours. Coagulation Profile: Recent Labs  Lab 01/05/20 0823  INR 1.1   Cardiac Enzymes: No results for input(s): CKTOTAL, CKMB, CKMBINDEX, TROPONINI in the last 168 hours. BNP (last 3 results) Recent Labs    06/28/19 1440  PROBNP 178   HbA1C: No results for input(s): HGBA1C in the last 72 hours. CBG: No results for input(s): GLUCAP in the last 168 hours. Lipid Profile: No results for input(s): CHOL, HDL, LDLCALC, TRIG, CHOLHDL, LDLDIRECT in the last 72 hours. Thyroid Function Tests: No results for input(s): TSH, T4TOTAL, FREET4, T3FREE, THYROIDAB in the last 72 hours. Anemia Panel: No results for input(s): VITAMINB12, FOLATE, FERRITIN, TIBC, IRON, RETICCTPCT in the last 72 hours. Sepsis Labs: Recent Labs  Lab 01/03/20 1723  LATICACIDVEN 1.2    Recent Results (from the past 240 hour(s))  C Difficile Quick Screen w PCR reflex     Status: Abnormal   Collection Time: 01/03/20  5:23 PM   Specimen: Stool  Result Value Ref Range Status   C Diff antigen POSITIVE (A) NEGATIVE Final   C Diff toxin POSITIVE (A) NEGATIVE Final   C Diff interpretation Toxin producing C. difficile detected.  Final    Comment: CRITICAL RESULT CALLED TO, READ BACK BY AND VERIFIED WITH: Verneda Skill 01/03/20 @ 1825  [place] Performed at [facility], [place], [place] [address]   SARS CORONAVIRUS 2 (TAT 6-24 HRS) Nasopharyngeal Nasopharyngeal Swab     Status: None   Collection Time: 01/03/20  5:26 PM   Specimen: Nasopharyngeal Swab  Result Value Ref Range Status   SARS Coronavirus 2 NEGATIVE NEGATIVE Final    Comment: (NOTE) SARS-CoV-2 target nucleic acids are NOT DETECTED. The SARS-CoV-2 RNA is generally detectable in upper and lower respiratory specimens during the acute phase of infection. Negative results do not preclude SARS-CoV-2 infection, do not rule out co-infections with other pathogens, and should not be used as the sole basis for treatment or other patient management decisions. Negative results must be combined with clinical observations, patient history, and epidemiological information. The expected result is Negative. Fact Sheet for Patients: SugarRoll.be Fact Sheet for Healthcare Providers: https://www.woods-mathews.com/ This test is not yet approved or cleared by the [place] and  has been authorized  for detection and/or diagnosis of SARS-CoV-2 by FDA under an Emergency Use Authorization (EUA). This EUA will remain  in effect (meaning this test can be used) for the duration of the COVID-19 declaration under Section 56 4(b)(1) of the Act, 21 U.S.C. section 360bbb-3(b)(1), unless the authorization is terminated or revoked sooner. Performed at [facility], [place] [address], [place], [place] [address]      Radiology Studies: DG Abd 1 View  Result Date: 01/04/2020 CLINICAL DATA:  C diff infection, diarrhea EXAM: ABDOMEN - 1 VIEW COMPARISON:  04/16/2019 FINDINGS: There is air and stool throughout the colon without significant distention. Bowel gas pattern is otherwise unremarkable. No acute osseous abnormality. IMPRESSION: Air and stool throughout the colon without significant distention. Electronically Signed   By: Macy Mis M.D.   On:  01/04/2020 12:01   CT HEAD WO CONTRAST  Result Date: 01/05/2020 CLINICAL DATA:  Ataxia.  Fall with head trauma. EXAM: CT HEAD WITHOUT CONTRAST TECHNIQUE: Contiguous axial images were obtained from the base of the skull through the vertex without intravenous contrast. COMPARISON:  07/20/2019 FINDINGS: Brain: Mild age related volume loss. No evidence of old or acute focal infarction, mass lesion, hemorrhage, hydrocephalus or extra-axial collection. Vascular: There is atherosclerotic calcification of the major vessels at the base of the brain. Skull: Negative Sinuses/Orbits: Clear/normal Other: None IMPRESSION: No acute or traumatic finding. Age related volume loss. Atherosclerotic calcification of major vessels at the base of brain. Electronically Signed   By: Nelson Chimes M.D.   On: 01/05/2020 00:11    Scheduled Meds: . amLODipine  2.5 mg Oral QPM  . aspirin  81 mg Oral Daily  . atenolol  50 mg Oral Daily   And  . chlorthalidone  25 mg Oral Daily  . diclofenac Sodium  4 g Topical QID  . enoxaparin (LOVENOX) injection  40 mg Subcutaneous Q24H  . finasteride  5 mg Oral Daily  . mirtazapine  30 mg Oral QHS  . nystatin  5 mL Oral QID  . tamsulosin  0.4 mg Oral Daily  . vancomycin  125 mg Oral QID   Continuous Infusions: . sodium chloride 75 mL/hr at 01/05/20 0134     LOS: 2 days   Time spent: 45 minutes.  Lorella Nimrod, MD [facility]  If 7PM-7AM, please contact night-coverage [email or web address]  01/05/2020, 11:33 AM   This record has been created using Systems analyst. Errors have been sought and corrected,but may not always be located. Such creation errors do not reflect on the standard of care.

## 2020-01-05 NOTE — Progress Notes (Signed)
I walked into pt's room. RN Laqueta Linden) and NT ([facility]) in pt's room washing him up. Pt had stool on floor and all over his backside. I asked the pt what happened. He stated "i fell when i was using the bathroom. I fell face forward into the trash can and brushed my elbow (left) against the wall." I asked him did he hit his head and he responded, "no, i dont think so." Neuro check completed. Neuro assessment WDL. pt did appear a little drowsy and i remembered his bedtime med remeron was given about an hour prior to fall. NP notified. Orders for Orthostatic VS and Stat head CT. New IV placed (pt had ripped it out getting out of bed). Results negative. Pt reported minimal pain in left arm. Pt stated he does not want his wife notified of the fall until the morning. Will continue to follow post fall protocol vs and neuro checks.

## 2020-01-06 ENCOUNTER — Other Ambulatory Visit: Payer: Self-pay

## 2020-01-06 LAB — COMPREHENSIVE METABOLIC PANEL
ALT: 39 U/L (ref 0–44)
AST: 30 U/L (ref 15–41)
Albumin: 2.3 g/dL — ABNORMAL LOW (ref 3.5–5.0)
Alkaline Phosphatase: 82 U/L (ref 38–126)
Anion gap: 9 (ref 5–15)
BUN: 38 mg/dL — ABNORMAL HIGH (ref 8–23)
CO2: 20 mmol/L — ABNORMAL LOW (ref 22–32)
Calcium: 7.6 mg/dL — ABNORMAL LOW (ref 8.9–10.3)
Chloride: 106 mmol/L (ref 98–111)
Creatinine, Ser: 1.09 mg/dL (ref 0.61–1.24)
GFR calc Af Amer: >60 mL/min (ref >60)
GFR calc non Af Amer: >60 mL/min (ref >60)
Glucose, Bld: 140 mg/dL — ABNORMAL HIGH (ref 70–99)
Potassium: 3.6 mmol/L (ref 3.5–5.1)
Sodium: 135 mmol/L (ref 135–145)
Total Bilirubin: 3.7 mg/dL — ABNORMAL HIGH (ref 0.3–1.2)
Total Protein: 5.1 g/dL — ABNORMAL LOW (ref 6.5–8.1)

## 2020-01-06 LAB — CBC
HCT: 40.5 % (ref 39.0–52.0)
Hemoglobin: 14 g/dL (ref 13.0–17.0)
MCH: 31.4 pg (ref 26.0–34.0)
MCHC: 34.6 g/dL (ref 30.0–36.0)
MCV: 90.8 fL (ref 80.0–100.0)
Platelets: 217 10*3/uL (ref 150–400)
RBC: 4.46 MIL/uL (ref 4.22–5.81)
RDW: 14.4 % (ref 11.5–15.5)
WBC: 29.5 10*3/uL — ABNORMAL HIGH (ref 4.0–10.5)
nRBC: 0 % (ref 0.0–0.2)

## 2020-01-06 MED ORDER — ZINC OXIDE 12.8 % EX OINT: TOPICAL_OINTMENT | CUTANEOUS | Status: DC | PRN

## 2020-01-06 MED ORDER — ZINC OXIDE 40 % EX OINT
TOPICAL_OINTMENT | CUTANEOUS | Status: DC | PRN
  Filled 2020-01-06: qty 113

## 2020-01-06 MED ORDER — TIMOLOL MALEATE 0.5 % OP SOLN
1.0000 [drp] | Freq: Every day | OPHTHALMIC | Status: DC
  Administered 2020-01-06 – 2020-01-09 (×4): 1 [drp] via OPHTHALMIC
  Filled 2020-01-06: qty 5

## 2020-01-06 MED ORDER — LATANOPROST 0.005 % OP SOLN
1.0000 [drp] | Freq: Every day | OPHTHALMIC | Status: DC
  Administered 2020-01-06 – 2020-01-08 (×3): 1 [drp] via OPHTHALMIC
  Filled 2020-01-06: qty 2.5

## 2020-01-06 NOTE — Progress Notes (Signed)
PROGRESS NOTE    Brett MCCONAUGHY  [id number] DOB: July 29, 1946 DOA: 01/03/2020 PCP: Birdie Sons, MD   Brief Narrative:  Brett Bender is a 74 y.o. male with medical history significant for Hx of hypertension, BPH, and recent tricep tendon repair who presents for concerns of diarrhea for at least the past week.  Patient currently took antibiotics during his tricep tendon repair surgery.  C. difficile was positive and he was started on p.o. vancomycin.  Subjective: Patient continued to experience watery bowel movements.  Nausea and vomiting has been improved.  He failed rectal tube overnight.  Assessment & Plan:   Principal Problem:   Clostridium difficile colitis Active Problems:   Essential (primary) hypertension   BPH (benign prostatic hyperplasia)   Diarrhea   AKI (acute kidney injury) ([place])   Hyperbilirubinemia  C. difficile colitis.  Repeat abdominal x-ray yesterday shows worsening of distention at transverse colon. Slight improvement in leukocytosis and T bili.  AKI resolved. Talked with Dr. Talbot Grumbling from ID-we will repeat abdominal x-ray to see any development of megacolon or ileus. -Continue vancomycin enema-500 mg every 6 hourly. -Continue p.o. vancomycin-Will need minimum of 10 days. -Continue to monitor. -We will repeat abdominal x-ray tomorrow.  AKI.  Creatinine peaked at 1.73 with baseline of 1.1.  Resolved with creatinine at 1.09 today.  He was also taking twice daily diclofenac at home. -Continue to monitor. -Avoid nephrotoxins.  Hyperbilirubinemia.  Slight improvement in T bili at 3.7 today, most likely secondary to dehydration and C. difficile colitis.  Magnesium and INR within normal range. -Continue to monitor. -Continue IV fluid at 100 mL/h.  Due to extensive GI losses.  Oral thrush Nystatin suspension   Hypertension Continue amlodipine Continue atenolol-chlorthalidone  BPH Continue Proscar and Flomax  Objective: Vitals:   01/05/20 2122  01/06/20 0043 01/06/20 0408 01/06/20 0601  BP: 121/76 126/75 127/71 123/73  Pulse: 63 63 63 61  Resp: 19  20 20   Temp: 98 F (36.7 C) 98.3 F (36.8 C) 97.8 F (36.6 C) 98.2 F (36.8 C)  TempSrc: Oral Oral Oral Oral  SpO2:  94% 96% 95%  Weight:      Height:        Intake/Output Summary (Last 24 hours) at 01/06/2020 0812 Last data filed at 01/06/2020 0500 Gross per 24 hour  Intake 3666.18 ml  Output --  Net 3666.18 ml   Filed Weights   01/03/20 1340  Weight: 97.5 kg    Examination:  General exam: Appears calm and comfortable. Respiratory system: Clear to auscultation. Respiratory effort normal. Cardiovascular system: S1 & S2 heard, RRR. No JVD, murmurs, rubs, gallops or clicks. Gastrointestinal system: Soft, mild diffuse abdominal tenderness, mildly distended, bowel sounds positive. Central nervous system: Alert and oriented. No focal neurological deficits.Symmetric 5 x 5 power. Extremities: No edema, no cyanosis, pulses intact and symmetrical. Skin: No rashes, lesions or ulcers Psychiatry: Judgement and insight appear normal. Mood & affect appropriate.    DVT prophylaxis: Lovenox Code Status: Full Family Communication: Wife was updated at bedside. Disposition Plan: Pending improvement, will go back home.  Consultants:   Curbside ID.   Procedures:  Antimicrobials: P.o. vancomycin. Vancomycin enema.  Data Reviewed: I have personally reviewed following labs and imaging studies  CBC: Recent Labs  Lab 01/03/20 1347 01/04/20 0457 01/05/20 0528 01/06/20 0429  WBC 34.4* 30.4* 30.2* 29.5*  HGB 14.5 13.6 14.1 14.0  HCT 43.0 39.7 40.7 40.5  MCV 94.1 92.3 91.3 90.8  PLT 207 197 216  [id number]   Basic Metabolic Panel: Recent Labs  Lab 01/03/20 1347 01/04/20 0457 01/05/20 0528 01/05/20 0823 01/06/20 0429  NA 139 139 135  --  135  K 3.7 3.5 3.3*  --  3.6  CL 106 109 107  --  106  CO2 24 22 19*  --  20*  GLUCOSE 158* 148* 158*  --  140*  BUN 57* 59* 46*  --  38*   CREATININE 1.73* 1.58* 1.30*  --  1.09  CALCIUM 8.5* 8.1* 7.6*  --  7.6*  MG  --   --   --  2.6*  --    GFR: Estimated Creatinine Clearance: 72.1 mL/min (by C-G formula based on SCr of 1.09 mg/dL). Liver Function Tests: Recent Labs  Lab 01/03/20 1347 01/05/20 0528 01/06/20 0429  AST 20 26 30   ALT 20 29 39  ALKPHOS 84 121 82  BILITOT 2.3* 4.1* 3.7*  PROT 6.8 5.6* 5.1*  ALBUMIN 3.2* 2.6* 2.3*   Recent Labs  Lab 01/03/20 1347  LIPASE 12   No results for input(s): AMMONIA in the last 168 hours. Coagulation Profile: Recent Labs  Lab 01/05/20 0823  INR 1.1   Cardiac Enzymes: No results for input(s): CKTOTAL, CKMB, CKMBINDEX, TROPONINI in the last 168 hours. BNP (last 3 results) Recent Labs    06/28/19 1440  PROBNP 178   HbA1C: No results for input(s): HGBA1C in the last 72 hours. CBG: No results for input(s): GLUCAP in the last 168 hours. Lipid Profile: No results for input(s): CHOL, HDL, LDLCALC, TRIG, CHOLHDL, LDLDIRECT in the last 72 hours. Thyroid Function Tests: No results for input(s): TSH, T4TOTAL, FREET4, T3FREE, THYROIDAB in the last 72 hours. Anemia Panel: No results for input(s): VITAMINB12, FOLATE, FERRITIN, TIBC, IRON, RETICCTPCT in the last 72 hours. Sepsis Labs: Recent Labs  Lab 01/03/20 1723  LATICACIDVEN 1.2    Recent Results (from the past 240 hour(s))  C Difficile Quick Screen w PCR reflex     Status: Abnormal   Collection Time: 01/03/20  5:23 PM   Specimen: Stool  Result Value Ref Range Status   C Diff antigen POSITIVE (A) NEGATIVE Final   C Diff toxin POSITIVE (A) NEGATIVE Final   C Diff interpretation Toxin producing C. difficile detected.  Final    Comment: CRITICAL RESULT CALLED TO, READ BACK BY AND VERIFIED WITH: Verneda Skill 01/03/20 @ 1825  [place] Performed at [facility], [place], [place] [address]   SARS CORONAVIRUS 2 (TAT 6-24 HRS) Nasopharyngeal Nasopharyngeal Swab     Status: None   Collection  Time: 01/03/20  5:26 PM   Specimen: Nasopharyngeal Swab  Result Value Ref Range Status   SARS Coronavirus 2 NEGATIVE NEGATIVE Final    Comment: (NOTE) SARS-CoV-2 target nucleic acids are NOT DETECTED. The SARS-CoV-2 RNA is generally detectable in upper and lower respiratory specimens during the acute phase of infection. Negative results do not preclude SARS-CoV-2 infection, do not rule out co-infections with other pathogens, and should not be used as the sole basis for treatment or other patient management decisions. Negative results must be combined with clinical observations, patient history, and epidemiological information. The expected result is Negative. Fact Sheet for Patients: SugarRoll.be Fact Sheet for Healthcare Providers: https://www.woods-mathews.com/ This test is not yet approved or cleared by the [place] FDA and  has been authorized for detection and/or diagnosis of SARS-CoV-2 by FDA under an Emergency Use Authorization (EUA). This EUA will remain  in effect (meaning this  test can be used) for the duration of the COVID-19 declaration under Section 56 4(b)(1) of the Act, 21 U.S.C. section 360bbb-3(b)(1), unless the authorization is terminated or revoked sooner. Performed at [facility], [place] [address] [place], [place] [address]      Radiology Studies: DG Abd 1 View  Result Date: 01/05/2020 CLINICAL DATA:  74 year old male with C difficile colitis, diarrhea, vomiting and abdominal pain EXAM: ABDOMEN - 1 VIEW COMPARISON:  Prior abdominal radiographs 01/03/2010 FINDINGS: Progressive gaseous distension of the colon. The descending and sigmoid colon demonstrate diffuse submucosal thickening with areas of thumbprinting most consistent with submucosal edema. No evidence of small-bowel obstruction. No large free air on this supine series. IMPRESSION: 1. Diffuse submucosal edema along the descending and sigmoid colon  consistent with the clinical history of C difficile colitis. 2. Progressive gaseous distension of the transverse colon. Electronically Signed   By: Jacqulynn Cadet M.D.   On: 01/05/2020 12:51   DG Abd 1 View  Result Date: 01/04/2020 CLINICAL DATA:  C diff infection, diarrhea EXAM: ABDOMEN - 1 VIEW COMPARISON:  04/16/2019 FINDINGS: There is air and stool throughout the colon without significant distention. Bowel gas pattern is otherwise unremarkable. No acute osseous abnormality. IMPRESSION: Air and stool throughout the colon without significant distention. Electronically Signed   By: Macy Mis M.D.   On: 01/04/2020 12:01   CT HEAD WO CONTRAST  Result Date: 01/05/2020 CLINICAL DATA:  Ataxia.  Fall with head trauma. EXAM: CT HEAD WITHOUT CONTRAST TECHNIQUE: Contiguous axial images were obtained from the base of the skull through the vertex without intravenous contrast. COMPARISON:  07/20/2019 FINDINGS: Brain: Mild age related volume loss. No evidence of old or acute focal infarction, mass lesion, hemorrhage, hydrocephalus or extra-axial collection. Vascular: There is atherosclerotic calcification of the major vessels at the base of the brain. Skull: Negative Sinuses/Orbits: Clear/normal Other: None IMPRESSION: No acute or traumatic finding. Age related volume loss. Atherosclerotic calcification of major vessels at the base of brain. Electronically Signed   By: Nelson Chimes M.D.   On: 01/05/2020 00:11    Scheduled Meds: . amLODipine  2.5 mg Oral QPM  . aspirin  81 mg Oral Daily  . atenolol  50 mg Oral Daily   And  . chlorthalidone  25 mg Oral Daily  . diclofenac Sodium  4 g Topical QID  . enoxaparin (LOVENOX) injection  40 mg Subcutaneous Q24H  . finasteride  5 mg Oral Daily  . mirtazapine  30 mg Oral QHS  . nystatin  5 mL Oral QID  . tamsulosin  0.4 mg Oral Daily  . vancomycin  125 mg Oral QID  . vancomycin (VANCOCIN) rectal ENEMA  500 mg Rectal Q6H   Continuous Infusions: . sodium  chloride 100 mL/hr at 01/06/20 0500     LOS: 3 days   Time spent: 40 minutes.  Lorella Nimrod, MD [facility]  If 7PM-7AM, please contact night-coverage [email or web address]  01/06/2020, 8:12 AM   This record has been created using Systems analyst. Errors have been sought and corrected,but may not always be located. Such creation errors do not reflect on the standard of care.

## 2020-01-06 NOTE — Progress Notes (Addendum)
   01/06/20 1210  Clinical Encounter Type  Visited With Family  Visit Type Initial;Spiritual support;Social support  Referral From Chaplain  Consult/Referral To [place] encountered patient's wife, Brett Bender on the Media planner. As Chaplain was entering elevator she saw Brett Bender coming down the hall. Chaplain told her to take her time. While on the elevator Chaplain engaged in small talk and once off the elevator Brett Bender expressed what was on her heart. Brett Bender told Chaplain about her husband's condition, but mention that she was recently diagnosed with breast cancer. Chaplain asked if she could pray with her and Brett Bender said yes. Together in [employer] prayed with Brett Bender and told her that she would check in on patient later. Once on the floor, Chaplain realized patient was in isolation, so she ask his nurse if she could call him. Nurse said yes I am sure he will enjoy a call, but call him later.

## 2020-01-07 ENCOUNTER — Inpatient Hospital Stay: Payer: Medicare Other

## 2020-01-07 LAB — COMPREHENSIVE METABOLIC PANEL
ALT: 47 U/L — ABNORMAL HIGH (ref 0–44)
AST: 32 U/L (ref 15–41)
Albumin: 2.2 g/dL — ABNORMAL LOW (ref 3.5–5.0)
Alkaline Phosphatase: 86 U/L (ref 38–126)
Anion gap: 8 (ref 5–15)
BUN: 29 mg/dL — ABNORMAL HIGH (ref 8–23)
CO2: 20 mmol/L — ABNORMAL LOW (ref 22–32)
Calcium: 7.6 mg/dL — ABNORMAL LOW (ref 8.9–10.3)
Chloride: 107 mmol/L (ref 98–111)
Creatinine, Ser: 0.94 mg/dL (ref 0.61–1.24)
GFR calc Af Amer: >60 mL/min (ref >60)
GFR calc non Af Amer: >60 mL/min (ref >60)
Glucose, Bld: 131 mg/dL — ABNORMAL HIGH (ref 70–99)
Potassium: 3.7 mmol/L (ref 3.5–5.1)
Sodium: 135 mmol/L (ref 135–145)
Total Bilirubin: 3.2 mg/dL — ABNORMAL HIGH (ref 0.3–1.2)
Total Protein: 4.9 g/dL — ABNORMAL LOW (ref 6.5–8.1)

## 2020-01-07 LAB — CBC
HCT: 41.2 % (ref 39.0–52.0)
Hemoglobin: 14.3 g/dL (ref 13.0–17.0)
MCH: 31.7 pg (ref 26.0–34.0)
MCHC: 34.7 g/dL (ref 30.0–36.0)
MCV: 91.4 fL (ref 80.0–100.0)
Platelets: 233 10*3/uL (ref 150–400)
RBC: 4.51 MIL/uL (ref 4.22–5.81)
RDW: 14.1 % (ref 11.5–15.5)
WBC: 26.1 10*3/uL — ABNORMAL HIGH (ref 4.0–10.5)
nRBC: 0 % (ref 0.0–0.2)

## 2020-01-07 LAB — GI PATHOGEN PANEL BY PCR, STOOL

## 2020-01-07 NOTE — Consult Note (Signed)
Infectious Disease     Reason for Consult: C diff    Referring Physician: Dr Reesa Chew Date of Admission:  01/03/2020   Principal Problem:   Clostridium difficile colitis Active Problems:   Essential (primary) hypertension   BPH (benign prostatic hyperplasia)   Diarrhea   AKI (acute kidney injury) ([place])   Hyperbilirubinemia   HPI: Brett Bender is a 73 y.o. male  with medical history significant for Hx of hypertension, BPH, and recent tricep tendon repair admitted 2/25 with one week of diarrhea and found to have C diff. Had received abx at time of triceps repai. On admit wbc 34, cr elevaed above baseline, T bil 4, initial xray neg. Started on oral vanco and vanco enemas added 2.27.   WBC down some, some slowing of diarrhea and cr down to baseline. T bili own to 3.2.  Today he reports feeling 30-50% better.   Past Medical History:  Diagnosis Date  . Arthritis   . Cancer ([place])    Basal cell carcinoma bilateral arms and left shoulder  . Complication of anesthesia    slow to wake up with shoulder surgery  . Depression   . Enlarged prostate   . H/O elbow surgery    left  . Herpes simplex   . History of kidney stones   . History of MRSA infection 2004   skin around eye  . Hypertension   . Kidney stone   . Lumbar stenosis   . Migraine headache   . Motion sickness    ocean boats  . PONV (postoperative nausea and vomiting)   . Tendonitis of ankle, left    Past Surgical History:  Procedure Laterality Date  . APPENDECTOMY    . back injection    . BACK SURGERY    . CYSTOSCOPY/URETEROSCOPY/HOLMIUM LASER/STENT PLACEMENT Left 12/18/2015   Procedure: CYSTOSCOPY/URETEROSCOPY/HOLMIUM LASER LITHOTRIPSY;  Surgeon: Hollice Espy, MD;  Location: [facility];  Service: Urology;  Laterality: Left;  . ELBOW SURGERY    . LOWER BACK SURGERY  2010   L4-5 fusion  . LUMBAR LAMINECTOMY/DECOMPRESSION MICRODISCECTOMY N/A 08/21/2018   Procedure: LUMBAR LAMINECTOMY/DECOMPRESSION MICRODISCECTOMY 1  LEVEL-L3/4;  Surgeon: Deetta Perla, MD;  Location: [facility];  Service: Neurosurgery;  Laterality: N/A;  . NECK SURGERY     Upper neck surgery; with wires placed  . SHOULDER SURGERY Right 11/10/2011   arthroscopic surgery . Dr. Tamala Julian, [facility]  . TONSILLECTOMY    . TRICEPS TENDON REPAIR Left 12/04/2019   Procedure: TRICEPS TENDON REPAIR, OLECRANON BURSA;  Surgeon: Leim Fabry, MD;  Location: [facility];  Service: Orthopedics;  Laterality: Left;   Social History   Tobacco Use  . Smoking status: Never Smoker  . Smokeless tobacco: Never Used  Substance Use Topics  . Alcohol use: No    Alcohol/week: 0.0 standard drinks  . Drug use: No   Family History  Problem Relation Age of Onset  . Depression Son   . Diabetes Neg Hx   . Cancer Neg Hx   . Heart attack Neg Hx   . Kidney disease Neg Hx   . Prostate cancer Neg Hx   . Kidney cancer Neg Hx   . Bladder Cancer Neg Hx     Allergies:  Allergies  Allergen Reactions  . Penicillins Other (See Comments)    Stomach Ache    Current antibiotics: Antibiotics Given (last 72 hours)    Date/Time Action Medication Dose   01/04/20 1703 Given   vancomycin (VANCOCIN) 50 mg/mL oral solution  125 mg 125 mg   01/04/20 2148 Given   vancomycin (VANCOCIN) 50 mg/mL oral solution 125 mg 125 mg   01/05/20 0850 Given   vancomycin (VANCOCIN) 50 mg/mL oral solution 125 mg 125 mg   01/05/20 1308 Given   vancomycin (VANCOCIN) 50 mg/mL oral solution 125 mg 125 mg   01/05/20 1438 Given   vancomycin (VANCOCIN) 500 mg in sodium chloride irrigation 0.9 % 100 mL ENEMA 500 mg   01/05/20 1730 Given   vancomycin (VANCOCIN) 50 mg/mL oral solution 125 mg 125 mg   01/05/20 2308 Given   vancomycin (VANCOCIN) 50 mg/mL oral solution 125 mg 125 mg   01/06/20 0050 Given   vancomycin (VANCOCIN) 500 mg in sodium chloride irrigation 0.9 % 100 mL ENEMA 500 mg   01/06/20 0942 Given   vancomycin (VANCOCIN) 50 mg/mL oral solution 125 mg 125 mg   01/06/20 1146 Given    vancomycin (VANCOCIN) 500 mg in sodium chloride irrigation 0.9 % 100 mL ENEMA 500 mg   01/06/20 1359 Given   vancomycin (VANCOCIN) 50 mg/mL oral solution 125 mg 125 mg   01/06/20 1754 Given   vancomycin (VANCOCIN) 50 mg/mL oral solution 125 mg 125 mg   01/06/20 1838 Given   vancomycin (VANCOCIN) 500 mg in sodium chloride irrigation 0.9 % 100 mL ENEMA 500 mg   01/06/20 2013 Given   vancomycin (VANCOCIN) 50 mg/mL oral solution 125 mg 125 mg   01/07/20 0001 Given   vancomycin (VANCOCIN) 500 mg in sodium chloride irrigation 0.9 % 100 mL ENEMA 500 mg   01/07/20 0539 Given   vancomycin (VANCOCIN) 500 mg in sodium chloride irrigation 0.9 % 100 mL ENEMA 500 mg   01/07/20 0920 Given   vancomycin (VANCOCIN) 50 mg/mL oral solution 125 mg 125 mg   01/07/20 1500 Given   vancomycin (VANCOCIN) 50 mg/mL oral solution 125 mg 125 mg   01/07/20 1507 Given   vancomycin (VANCOCIN) 500 mg in sodium chloride irrigation 0.9 % 100 mL ENEMA 500 mg      MEDICATIONS: . amLODipine  2.5 mg Oral QPM  . aspirin  81 mg Oral Daily  . atenolol  50 mg Oral Daily   And  . chlorthalidone  25 mg Oral Daily  . diclofenac Sodium  4 g Topical QID  . enoxaparin (LOVENOX) injection  40 mg Subcutaneous Q24H  . finasteride  5 mg Oral Daily  . latanoprost  1 drop Both Eyes QHS  . mirtazapine  30 mg Oral QHS  . nystatin  5 mL Oral QID  . tamsulosin  0.4 mg Oral Daily  . timolol  1 drop Both Eyes Daily  . vancomycin  125 mg Oral QID  . vancomycin (VANCOCIN) rectal ENEMA  500 mg Rectal Q6H    Review of Systems - 11 systems reviewed and negative per HPI   OBJECTIVE: Temp:  [97.4 F (36.3 C)-98.2 F (36.8 C)] 97.6 F (36.4 C) (03/01 1153) Pulse Rate:  [53-61] 53 (03/01 1153) Resp:  [16-20] 16 (03/01 1153) BP: (124-134)/(68-80) 125/77 (03/01 1153) SpO2:  [96 %-98 %] 98 % (03/01 1153) Physical Exam  Constitutional: He is oriented to person, place, and time. He appears well-developed and well-nourished. No distress.   HENT: anicteric Mouth/Throat: Oropharynx is clear and dry. No oropharyngeal exudate.  Cardiovascular: Normal rate, regular rhythm and normal heart sounds. Exam reveals no gallop and no friction rub.  No murmur heard.  Pulmonary/Chest: Effort normal and breath sounds normal. No respiratory distress. He has  no wheezes.  Abdominal: Soft. Mild distention and tympany Bowel sounds are normal.  Lymphadenopathy: He has no cervical adenopathy.  Neurological: He is alert and oriented to person, place, and time.  Skin: Skin is warm and dry. No rash noted. No erythema.  Psychiatric: He has a normal mood and affect. His behavior is normal.     LABS: Results for orders placed or performed during the hospital encounter of 01/03/20 (from the past 48 hour(s))  CBC     Status: Abnormal   Collection Time: 01/06/20  4:29 AM  Result Value Ref Range   WBC 29.5 (H) 4.0 - 10.5 K/uL   RBC 4.46 4.22 - 5.81 MIL/uL   Hemoglobin 14.0 13.0 - 17.0 g/dL   HCT 40.5 39.0 - 52.0 %   MCV 90.8 80.0 - 100.0 fL   MCH 31.4 26.0 - 34.0 pg   MCHC 34.6 30.0 - 36.0 g/dL   RDW 14.4 11.5 - 15.5 %   Platelets 217 150 - 400 K/uL   nRBC 0.0 0.0 - 0.2 %    Comment: Performed at [facility], [address] [place], [place] [address]  Comprehensive metabolic panel     Status: Abnormal   Collection Time: 01/06/20  4:29 AM  Result Value Ref Range   Sodium 135 135 - 145 mmol/L   Potassium 3.6 3.5 - 5.1 mmol/L   Chloride 106 98 - 111 mmol/L   CO2 20 (L) 22 - 32 mmol/L   Glucose, Bld 140 (H) 70 - 99 mg/dL    Comment: Glucose reference range applies only to samples taken after fasting for at least 8 hours.   BUN 38 (H) 8 - 23 mg/dL   Creatinine, Ser 1.09 0.61 - 1.24 mg/dL   Calcium 7.6 (L) 8.9 - 10.3 mg/dL   Total Protein 5.1 (L) 6.5 - 8.1 g/dL   Albumin 2.3 (L) 3.5 - 5.0 g/dL   AST 30 15 - 41 U/L   ALT 39 0 - 44 U/L   Alkaline Phosphatase 82 38 - 126 U/L   Total Bilirubin 3.7 (H) 0.3 - 1.2 mg/dL   GFR calc non Af  Amer >60 >60 mL/min   GFR calc Af Amer >60 >60 mL/min   Anion gap 9 5 - 15    Comment: Performed at [facility], [address] [place], [place] [address]  CBC     Status: Abnormal   Collection Time: 01/07/20  4:25 AM  Result Value Ref Range   WBC 26.1 (H) 4.0 - 10.5 K/uL   RBC 4.51 4.22 - 5.81 MIL/uL   Hemoglobin 14.3 13.0 - 17.0 g/dL   HCT 41.2 39.0 - 52.0 %   MCV 91.4 80.0 - 100.0 fL   MCH 31.7 26.0 - 34.0 pg   MCHC 34.7 30.0 - 36.0 g/dL   RDW 14.1 11.5 - 15.5 %   Platelets 233 150 - 400 K/uL   nRBC 0.0 0.0 - 0.2 %    Comment: Performed at [facility], [address] [place], [place] [address]  Comprehensive metabolic panel     Status: Abnormal   Collection Time: 01/07/20  4:25 AM  Result Value Ref Range   Sodium 135 135 - 145 mmol/L   Potassium 3.7 3.5 - 5.1 mmol/L   Chloride 107 98 - 111 mmol/L   CO2 20 (L) 22 - 32 mmol/L   Glucose, Bld 131 (H) 70 - 99 mg/dL    Comment: Glucose reference range applies only  to samples taken after fasting for at least 8 hours.   BUN 29 (H) 8 - 23 mg/dL   Creatinine, Ser 0.94 0.61 - 1.24 mg/dL   Calcium 7.6 (L) 8.9 - 10.3 mg/dL   Total Protein 4.9 (L) 6.5 - 8.1 g/dL   Albumin 2.2 (L) 3.5 - 5.0 g/dL   AST 32 15 - 41 U/L   ALT 47 (H) 0 - 44 U/L   Alkaline Phosphatase 86 38 - 126 U/L   Total Bilirubin 3.2 (H) 0.3 - 1.2 mg/dL   GFR calc non Af Amer >60 >60 mL/min   GFR calc Af Amer >60 >60 mL/min   Anion gap 8 5 - 15    Comment: Performed at [facility], [address] [place], [place] [address]   No components found for: ESR, C REACTIVE PROTEIN MICRO: Recent Results (from the past 720 hour(s))  C Difficile Quick Screen w PCR reflex     Status: Abnormal   Collection Time: 01/03/20  5:23 PM   Specimen: Stool  Result Value Ref Range Status   C Diff antigen POSITIVE (A) NEGATIVE Final   C Diff toxin POSITIVE (A) NEGATIVE Final   C Diff interpretation Toxin producing C. difficile detected.  Final     Comment: CRITICAL RESULT CALLED TO, READ BACK BY AND VERIFIED WITH: Verneda Skill 01/03/20 @ 1825  [place] Performed at [facility], [place], [place] [address]   SARS CORONAVIRUS 2 (TAT 6-24 HRS) Nasopharyngeal Nasopharyngeal Swab     Status: None   Collection Time: 01/03/20  5:26 PM   Specimen: Nasopharyngeal Swab  Result Value Ref Range Status   SARS Coronavirus 2 NEGATIVE NEGATIVE Final    Comment: (NOTE) SARS-CoV-2 target nucleic acids are NOT DETECTED. The SARS-CoV-2 RNA is generally detectable in upper and lower respiratory specimens during the acute phase of infection. Negative results do not preclude SARS-CoV-2 infection, do not rule out co-infections with other pathogens, and should not be used as the sole basis for treatment or other patient management decisions. Negative results must be combined with clinical observations, patient history, and epidemiological information. The expected result is Negative. Fact Sheet for Patients: SugarRoll.be Fact Sheet for Healthcare Providers: https://www.woods-mathews.com/ This test is not yet approved or cleared by the [place] FDA and  has been authorized for detection and/or diagnosis of SARS-CoV-2 by FDA under an Emergency Use Authorization (EUA). This EUA will remain  in effect (meaning this test can be used) for the duration of the COVID-19 declaration under Section 56 4(b)(1) of the Act, 21 U.S.C. section 360bbb-3(b)(1), unless the authorization is terminated or revoked sooner. Performed at [facility], [place] [address] [place], [place] [address]     IMAGING: DG Abd 1 View  Result Date: 01/07/2020 CLINICAL DATA:  Lower abdominal pain and diarrhea for 5 days. C difficile colitis. EXAM: ABDOMEN - 1 VIEW COMPARISON:  01/05/2020 FINDINGS: There is persistent mucosal edema in the cecum and transverse portion of the colon. Overall diminished air in the bowel.  No dilated small bowel. Stomach is not distended. Multiple right renal calculi noted. No acute bone abnormality. IMPRESSION: Persistent mucosal edema in the cecum and transverse portion of the colon consistent with colitis. Electronically Signed   By: Lorriane Shire M.D.   On: 01/07/2020 09:06   DG Abd 1 View  Result Date: 01/05/2020 CLINICAL DATA:  74 year old male with C difficile colitis, diarrhea, vomiting and abdominal pain EXAM: ABDOMEN - 1 VIEW  COMPARISON:  Prior abdominal radiographs 01/03/2010 FINDINGS: Progressive gaseous distension of the colon. The descending and sigmoid colon demonstrate diffuse submucosal thickening with areas of thumbprinting most consistent with submucosal edema. No evidence of small-bowel obstruction. No large free air on this supine series. IMPRESSION: 1. Diffuse submucosal edema along the descending and sigmoid colon consistent with the clinical history of C difficile colitis. 2. Progressive gaseous distension of the transverse colon. Electronically Signed   By: Jacqulynn Cadet M.D.   On: 01/05/2020 12:51   DG Abd 1 View  Result Date: 01/04/2020 CLINICAL DATA:  C diff infection, diarrhea EXAM: ABDOMEN - 1 VIEW COMPARISON:  04/16/2019 FINDINGS: There is air and stool throughout the colon without significant distention. Bowel gas pattern is otherwise unremarkable. No acute osseous abnormality. IMPRESSION: Air and stool throughout the colon without significant distention. Electronically Signed   By: Macy Mis M.D.   On: 01/04/2020 12:01   CT HEAD WO CONTRAST  Result Date: 01/05/2020 CLINICAL DATA:  Ataxia.  Fall with head trauma. EXAM: CT HEAD WITHOUT CONTRAST TECHNIQUE: Contiguous axial images were obtained from the base of the skull through the vertex without intravenous contrast. COMPARISON:  07/20/2019 FINDINGS: Brain: Mild age related volume loss. No evidence of old or acute focal infarction, mass lesion, hemorrhage, hydrocephalus or extra-axial collection.  Vascular: There is atherosclerotic calcification of the major vessels at the base of the brain. Skull: Negative Sinuses/Orbits: Clear/normal Other: None IMPRESSION: No acute or traumatic finding. Age related volume loss. Atherosclerotic calcification of major vessels at the base of brain. Electronically Signed   By: Nelson Chimes M.D.   On: 01/05/2020 00:11    Assessment:   Brett Bender is a 74 y.o. male with severe C diff following oral abx after a triceps surgery. Clinically improving with oral and rectal vanco  Recommendations Cont oral and rectal vanco today. WBC down to 20. If continues to improve would change tomorrow to oral only and if tolerating diet could be dced home with 14 total day treatment course  Thank you very much for allowing me to participate in the care of this patient. Please call with questions.   Cheral Marker. Ola Spurr, MD

## 2020-01-07 NOTE — Progress Notes (Signed)
PROGRESS NOTE    Brett Bender  [id number] DOB: [birth year] DOA: 01/03/2020 PCP: Birdie Sons, MD   Brief Narrative:  Brett Bender is a 74 y.o. male with medical history significant for Hx of hypertension, BPH, and recent tricep tendon repair who presents for concerns of diarrhea for at least the past week.  Patient currently took antibiotics during his tricep tendon repair surgery.  C. difficile was positive and he was started on p.o. vancomycin.  Subjective: Patient's diarrhea shows some improvement, little less frequent, becoming more formed.  No nausea or vomiting.  Continue to have diffuse abdominal pain.  Assessment & Plan:   Principal Problem:   Clostridium difficile colitis Active Problems:   Essential (primary) hypertension   BPH (benign prostatic hyperplasia)   Diarrhea   AKI (acute kidney injury) ([place])   Hyperbilirubinemia  C. difficile colitis.  Repeat abdominal x-ray today shows persistent distention at transverse colon. Some improvement in leukocytosis and T bili.  AKI resolved. Clinically started improving. ID will see him today and put their recommendations. -Continue vancomycin enema-500 mg every 6 hourly. -Continue p.o. vancomycin -Continue to monitor.  AKI.  Creatinine peaked at 1.73 with baseline of 1.1.  Resolved with creatinine at 0.94 today.  He was also taking twice daily diclofenac at home. -Continue to monitor. -Avoid nephrotoxins.  Hyperbilirubinemia.  T bili at 3.2 today, continue to improve, most likely secondary to dehydration and C. difficile colitis.  Magnesium and INR within normal range. -Continue to monitor.  Oral thrush Nystatin suspension   Hypertension Continue amlodipine Continue atenolol-chlorthalidone  BPH Continue Proscar and Flomax  Objective: Vitals:   01/06/20 2116 01/07/20 0538 01/07/20 0920 01/07/20 1153  BP: 126/79 134/68 130/73 125/77  Pulse: (!) 59 60 61 (!) 53  Resp: 20 20  16   Temp: (!) 97.4 F  (36.3 C) 98.2 F (36.8 C)  97.6 F (36.4 C)  TempSrc: Oral Oral  Oral  SpO2: 98% 96%  98%  Weight:      Height:        Intake/Output Summary (Last 24 hours) at 01/07/2020 1319 Last data filed at 01/07/2020 [id number] Gross per 24 hour  Intake 1007.11 ml  Output --  Net 1007.11 ml   Filed Weights   01/03/20 1340  Weight: 97.5 kg    Examination:  General exam: Appears calm and comfortable. Respiratory system: Clear to auscultation. Respiratory effort normal. Cardiovascular system: S1 & S2 heard, RRR. No JVD, murmurs, rubs, gallops or clicks. Gastrointestinal system: Soft, mild diffuse abdominal tenderness, mildly distended, bowel sounds positive. Central nervous system: Alert and oriented. No focal neurological deficits.Symmetric 5 x 5 power. Extremities: No edema, no cyanosis, pulses intact and symmetrical. Skin: No rashes, lesions or ulcers Psychiatry: Judgement and insight appear normal. Mood & affect appropriate.    DVT prophylaxis: Lovenox Code Status: Full Family Communication: Wife was updated at bedside. Disposition Plan: Pending improvement, will go back home.  Consultants:   ID.   Procedures:  Antimicrobials: P.o. vancomycin. Vancomycin enema.  Data Reviewed: I have personally reviewed following labs and imaging studies  CBC: Recent Labs  Lab 01/03/20 1347 01/04/20 0457 01/05/20 0528 01/06/20 0429 01/07/20 0425  WBC 34.4* 30.4* 30.2* 29.5* 26.1*  HGB 14.5 13.6 14.1 14.0 14.3  HCT 43.0 39.7 40.7 40.5 41.2  MCV 94.1 92.3 91.3 90.8 91.4  PLT 207 197 216 217 0000000   Basic Metabolic Panel: Recent Labs  Lab 01/03/20 1347 01/04/20 0457 01/05/20 0528 01/05/20 [id number] 01/06/20 0429 01/07/20 0425  NA 139 139 135  --  135 135  K 3.7 3.5 3.3*  --  3.6 3.7  CL 106 109 107  --  106 107  CO2 24 22 19*  --  20* 20*  GLUCOSE 158* 148* 158*  --  140* 131*  BUN 57* 59* 46*  --  38* 29*  CREATININE 1.73* 1.58* 1.30*  --  1.09 0.94  CALCIUM 8.5* 8.1* 7.6*  --  7.6*  7.6*  MG  --   --   --  2.6*  --   --    GFR: Estimated Creatinine Clearance: 83.7 mL/min (by C-G formula based on SCr of 0.94 mg/dL). Liver Function Tests: Recent Labs  Lab 01/03/20 1347 01/05/20 0528 01/06/20 0429 01/07/20 0425  AST 20 26 30  32  ALT 20 29 39 47*  ALKPHOS 84 121 82 86  BILITOT 2.3* 4.1* 3.7* 3.2*  PROT 6.8 5.6* 5.1* 4.9*  ALBUMIN 3.2* 2.6* 2.3* 2.2*   Recent Labs  Lab 01/03/20 1347  LIPASE 12   No results for input(s): AMMONIA in the last 168 hours. Coagulation Profile: Recent Labs  Lab 01/05/20 0823  INR 1.1   Cardiac Enzymes: No results for input(s): CKTOTAL, CKMB, CKMBINDEX, TROPONINI in the last 168 hours. BNP (last 3 results) Recent Labs    06/28/19 1440  PROBNP 178   HbA1C: No results for input(s): HGBA1C in the last 72 hours. CBG: No results for input(s): GLUCAP in the last 168 hours. Lipid Profile: No results for input(s): CHOL, HDL, LDLCALC, TRIG, CHOLHDL, LDLDIRECT in the last 72 hours. Thyroid Function Tests: No results for input(s): TSH, T4TOTAL, FREET4, T3FREE, THYROIDAB in the last 72 hours. Anemia Panel: No results for input(s): VITAMINB12, FOLATE, FERRITIN, TIBC, IRON, RETICCTPCT in the last 72 hours. Sepsis Labs: Recent Labs  Lab 01/03/20 1723  LATICACIDVEN 1.2    Recent Results (from the past 240 hour(s))  C Difficile Quick Screen w PCR reflex     Status: Abnormal   Collection Time: 01/03/20  5:23 PM   Specimen: Stool  Result Value Ref Range Status   C Diff antigen POSITIVE (A) NEGATIVE Final   C Diff toxin POSITIVE (A) NEGATIVE Final   C Diff interpretation Toxin producing C. difficile detected.  Final    Comment: CRITICAL RESULT CALLED TO, READ BACK BY AND VERIFIED WITH: Verneda Skill 01/03/20 @ 1825  [place] Performed at [facility], [place], [place] [address]   SARS CORONAVIRUS 2 (TAT 6-24 HRS) Nasopharyngeal Nasopharyngeal Swab     Status: None   Collection Time: 01/03/20  5:26 PM    Specimen: Nasopharyngeal Swab  Result Value Ref Range Status   SARS Coronavirus 2 NEGATIVE NEGATIVE Final    Comment: (NOTE) SARS-CoV-2 target nucleic acids are NOT DETECTED. The SARS-CoV-2 RNA is generally detectable in upper and lower respiratory specimens during the acute phase of infection. Negative results do not preclude SARS-CoV-2 infection, do not rule out co-infections with other pathogens, and should not be used as the sole basis for treatment or other patient management decisions. Negative results must be combined with clinical observations, patient history, and epidemiological information. The expected result is Negative. Fact Sheet for Patients: SugarRoll.be Fact Sheet for Healthcare Providers: https://www.woods-mathews.com/ This test is not yet approved or cleared by the [place] FDA and  has been authorized for detection and/or diagnosis of SARS-CoV-2 by FDA under an Emergency Use Authorization (EUA). This EUA will remain  in effect (meaning this test can  be used) for the duration of the COVID-19 declaration under Section 56 4(b)(1) of the Act, 21 U.S.C. section 360bbb-3(b)(1), unless the authorization is terminated or revoked sooner. Performed at [facility], [place] [address], [place], [place] [address]      Radiology Studies: DG Abd 1 View  Result Date: 01/07/2020 CLINICAL DATA:  Lower abdominal pain and diarrhea for 5 days. C difficile colitis. EXAM: ABDOMEN - 1 VIEW COMPARISON:  01/05/2020 FINDINGS: There is persistent mucosal edema in the cecum and transverse portion of the colon. Overall diminished air in the bowel. No dilated small bowel. Stomach is not distended. Multiple right renal calculi noted. No acute bone abnormality. IMPRESSION: Persistent mucosal edema in the cecum and transverse portion of the colon consistent with colitis. Electronically Signed   By: Lorriane Shire M.D.   On: 01/07/2020 09:06     Scheduled Meds: . amLODipine  2.5 mg Oral QPM  . aspirin  81 mg Oral Daily  . atenolol  50 mg Oral Daily   And  . chlorthalidone  25 mg Oral Daily  . diclofenac Sodium  4 g Topical QID  . enoxaparin (LOVENOX) injection  40 mg Subcutaneous Q24H  . finasteride  5 mg Oral Daily  . latanoprost  1 drop Both Eyes QHS  . mirtazapine  30 mg Oral QHS  . nystatin  5 mL Oral QID  . tamsulosin  0.4 mg Oral Daily  . timolol  1 drop Both Eyes Daily  . vancomycin  125 mg Oral QID  . vancomycin (VANCOCIN) rectal ENEMA  500 mg Rectal Q6H   Continuous Infusions:    LOS: 4 days   Time spent: 40 minutes.  Lorella Nimrod, MD [facility]  If 7PM-7AM, please contact night-coverage [email or web address]  01/07/2020, 1:19 PM   This record has been created using Systems analyst. Errors have been sought and corrected,but may not always be located. Such creation errors do not reflect on the standard of care.

## 2020-01-08 LAB — COMPREHENSIVE METABOLIC PANEL
ALT: 38 U/L (ref 0–44)
AST: 22 U/L (ref 15–41)
Albumin: 2.1 g/dL — ABNORMAL LOW (ref 3.5–5.0)
Alkaline Phosphatase: 72 U/L (ref 38–126)
Anion gap: 9 (ref 5–15)
BUN: 23 mg/dL (ref 8–23)
CO2: 23 mmol/L (ref 22–32)
Calcium: 7.5 mg/dL — ABNORMAL LOW (ref 8.9–10.3)
Chloride: 103 mmol/L (ref 98–111)
Creatinine, Ser: 0.86 mg/dL (ref 0.61–1.24)
GFR calc Af Amer: >60 mL/min (ref >60)
GFR calc non Af Amer: >60 mL/min (ref >60)
Glucose, Bld: 104 mg/dL — ABNORMAL HIGH (ref 70–99)
Potassium: 3.4 mmol/L — ABNORMAL LOW (ref 3.5–5.1)
Sodium: 135 mmol/L (ref 135–145)
Total Bilirubin: 2.3 mg/dL — ABNORMAL HIGH (ref 0.3–1.2)
Total Protein: 4.8 g/dL — ABNORMAL LOW (ref 6.5–8.1)

## 2020-01-08 LAB — CBC
HCT: 40.2 % (ref 39.0–52.0)
Hemoglobin: 13.9 g/dL (ref 13.0–17.0)
MCH: 32 pg (ref 26.0–34.0)
MCHC: 34.6 g/dL (ref 30.0–36.0)
MCV: 92.6 fL (ref 80.0–100.0)
Platelets: 244 10*3/uL (ref 150–400)
RBC: 4.34 MIL/uL (ref 4.22–5.81)
RDW: 14.2 % (ref 11.5–15.5)
WBC: 20.4 10*3/uL — ABNORMAL HIGH (ref 4.0–10.5)
nRBC: 0 % (ref 0.0–0.2)

## 2020-01-08 NOTE — Progress Notes (Signed)
PROGRESS NOTE    Brett Bender  [id number] DOB: 1946/08/12 DOA: 01/03/2020 PCP: Brett Sons, MD   Brief Narrative:  Brett Bender is a 74 y.o. male with medical history significant for Hx of hypertension, BPH, and recent tricep tendon repair who presents for concerns of diarrhea for at least the past week.  Patient currently took antibiotics during his tricep tendon repair surgery.  C. difficile was positive and he was started on p.o. vancomycin.  Subjective: Patient was feeling little better when seen this morning.  Diarrhea seems to be improving.  He was able to tolerate some food.  Assessment & Plan:   Principal Problem:   Clostridium difficile colitis Active Problems:   Essential (primary) hypertension   BPH (benign prostatic hyperplasia)   Diarrhea   AKI (acute kidney injury) ([place])   Hyperbilirubinemia  C. difficile colitis.  Repeat abdominal x-ray yesterday shows persistent distention at transverse colon.  Today clinically seems improving. Some improvement in leukocytosis and T bili.  AKI resolved. ID will see him today and put their recommendations. -Continue vancomycin enema-500 mg every 6 hourly-for another day. -Continue p.o. vancomycin-will need a total of 14-day course. -Continue to monitor. -If continue to improve-can be discharged home on p.o. vancomycin tomorrow.  AKI.  Creatinine peaked at 1.73 with baseline of 1.1.  Resolved with creatinine at 0.94 today.  He was also taking twice daily diclofenac at home. -Continue to monitor. -Avoid nephrotoxins.  Hyperbilirubinemia.  T bili at 2.3 today, continue to improve, most likely secondary to dehydration and C. difficile colitis.  Magnesium and INR within normal range. -Continue to monitor.  Oral thrush Nystatin suspension   Hypertension Continue amlodipine Continue atenolol-chlorthalidone  BPH Continue Proscar and Flomax  Objective: Vitals:   01/07/20 1734 01/07/20 2043 01/08/20 0513 01/08/20  1405  BP: (!) 144/78 122/72 116/69 129/76  Pulse:  (!) 54 (!) 59 (!) 58  Resp:  20 20 14   Temp:  98.4 F (36.9 C) 98.4 F (36.9 C) 97.9 F (36.6 C)  TempSrc:  Oral Oral Oral  SpO2:  95% 96% 100%  Weight:      Height:        Intake/Output Summary (Last 24 hours) at 01/08/2020 1656 Last data filed at 01/08/2020 0500 Gross per 24 hour  Intake 0 ml  Output 0 ml  Net 0 ml   Filed Weights   01/03/20 1340  Weight: 97.5 kg    Examination:  General exam: Appears calm and comfortable. Respiratory system: Clear to auscultation. Respiratory effort normal. Cardiovascular system: S1 & S2 heard, RRR. No JVD, murmurs, rubs, gallops or clicks. Gastrointestinal system: Soft, mild diffuse abdominal tenderness, mildly distended, bowel sounds positive. Central nervous system: Alert and oriented. No focal neurological deficits.Symmetric 5 x 5 power. Extremities: No edema, no cyanosis, pulses intact and symmetrical. Skin: No rashes, lesions or ulcers Psychiatry: Judgement and insight appear normal. Mood & affect appropriate.    DVT prophylaxis: Lovenox Code Status: Full Family Communication: No family at bedside. Disposition Plan: Pending improvement, will go back home. If continue to improve can go back home tomorrow on p.o. vancomycin to complete a 14 days course.  Consultants:   ID.   Procedures:  Antimicrobials: P.o. vancomycin. Vancomycin enema.  Data Reviewed: I have personally reviewed following labs and imaging studies  CBC: Recent Labs  Lab 01/04/20 0457 01/05/20 0528 01/06/20 0429 01/07/20 0425 01/08/20 0544  WBC 30.4* 30.2* 29.5* 26.1* 20.4*  HGB 13.6 14.1 14.0 14.3 13.9  HCT  39.7 40.7 40.5 41.2 40.2  MCV 92.3 91.3 90.8 91.4 92.6  PLT 197 216 217 233 [id number]   Basic Metabolic Panel: Recent Labs  Lab 01/04/20 0457 01/05/20 0528 01/05/20 0823 01/06/20 0429 01/07/20 0425 01/08/20 0544  NA 139 135  --  135 135 135  K 3.5 3.3*  --  3.6 3.7 3.4*  CL 109 107  --   106 107 103  CO2 22 19*  --  20* 20* 23  GLUCOSE 148* 158*  --  140* 131* 104*  BUN 59* 46*  --  38* 29* 23  CREATININE 1.58* 1.30*  --  1.09 0.94 0.86  CALCIUM 8.1* 7.6*  --  7.6* 7.6* 7.5*  MG  --   --  2.6*  --   --   --    GFR: Estimated Creatinine Clearance: 91.4 mL/min (by C-G formula based on SCr of 0.86 mg/dL). Liver Function Tests: Recent Labs  Lab 01/03/20 1347 01/05/20 0528 01/06/20 0429 01/07/20 0425 01/08/20 0544  AST 20 26 30  32 22  ALT 20 29 39 47* 38  ALKPHOS 84 121 82 86 72  BILITOT 2.3* 4.1* 3.7* 3.2* 2.3*  PROT 6.8 5.6* 5.1* 4.9* 4.8*  ALBUMIN 3.2* 2.6* 2.3* 2.2* 2.1*   Recent Labs  Lab 01/03/20 1347  LIPASE 12   No results for input(s): AMMONIA in the last 168 hours. Coagulation Profile: Recent Labs  Lab 01/05/20 0823  INR 1.1   Cardiac Enzymes: No results for input(s): CKTOTAL, CKMB, CKMBINDEX, TROPONINI in the last 168 hours. BNP (last 3 results) Recent Labs    06/28/19 1440  PROBNP 178   HbA1C: No results for input(s): HGBA1C in the last 72 hours. CBG: No results for input(s): GLUCAP in the last 168 hours. Lipid Profile: No results for input(s): CHOL, HDL, LDLCALC, TRIG, CHOLHDL, LDLDIRECT in the last 72 hours. Thyroid Function Tests: No results for input(s): TSH, T4TOTAL, FREET4, T3FREE, THYROIDAB in the last 72 hours. Anemia Panel: No results for input(s): VITAMINB12, FOLATE, FERRITIN, TIBC, IRON, RETICCTPCT in the last 72 hours. Sepsis Labs: Recent Labs  Lab 01/03/20 1723  LATICACIDVEN 1.2    Recent Results (from the past 240 hour(s))  GI pathogen panel by PCR, stool     Status: None   Collection Time: 01/03/20  5:23 PM   Specimen: Stool  Result Value Ref Range Status   Plesiomonas shigelloides NOT DETECTED NOT DETECTED Final   Yersinia enterocolitica NOT DETECTED NOT DETECTED Final   Vibrio NOT DETECTED NOT DETECTED Final   Enteropathogenic E coli NOT DETECTED NOT DETECTED Final   E coli (ETEC) LT/ST NOT DETECTED NOT  DETECTED Final   E coli [id number] by PCR Not applicable NOT DETECTED Final   Cryptosporidium by PCR NOT DETECTED NOT DETECTED Final   Entamoeba histolytica NOT DETECTED NOT DETECTED Final   Adenovirus F 40/41 NOT DETECTED NOT DETECTED Final   Norovirus GI/GII NOT DETECTED NOT DETECTED Final   Sapovirus NOT DETECTED NOT DETECTED Final    Comment: (NOTE) Performed At: [facility] [place], [place] [id number] Rush Farmer MD [id number]    Vibrio cholerae NOT DETECTED NOT DETECTED Final   Campylobacter by PCR NOT DETECTED NOT DETECTED Final   Salmonella by PCR NOT DETECTED NOT DETECTED Final   E coli (STEC) NOT DETECTED NOT DETECTED Final   Enteroaggregative E coli NOT DETECTED NOT DETECTED Final   Shigella by PCR NOT DETECTED NOT DETECTED Final   Cyclospora cayetanensis NOT DETECTED NOT  DETECTED Final   Astrovirus NOT DETECTED NOT DETECTED Final   G lamblia by PCR NOT DETECTED NOT DETECTED Final   Rotavirus A by PCR NOT DETECTED NOT DETECTED Final  C Difficile Quick Screen w PCR reflex     Status: Abnormal   Collection Time: 01/03/20  5:23 PM   Specimen: Stool  Result Value Ref Range Status   C Diff antigen POSITIVE (A) NEGATIVE Final   C Diff toxin POSITIVE (A) NEGATIVE Final   C Diff interpretation Toxin producing C. difficile detected.  Final    Comment: CRITICAL RESULT CALLED TO, READ BACK BY AND VERIFIED WITH: Verneda Skill 01/03/20 @ 1825  [place] Performed at [facility], [place], [place] [address]   SARS CORONAVIRUS 2 (TAT 6-24 HRS) Nasopharyngeal Nasopharyngeal Swab     Status: None   Collection Time: 01/03/20  5:26 PM   Specimen: Nasopharyngeal Swab  Result Value Ref Range Status   SARS Coronavirus 2 NEGATIVE NEGATIVE Final    Comment: (NOTE) SARS-CoV-2 target nucleic acids are NOT DETECTED. The SARS-CoV-2 RNA is generally detectable in upper and lower respiratory specimens during the acute phase of infection.  Negative results do not preclude SARS-CoV-2 infection, do not rule out co-infections with other pathogens, and should not be used as the sole basis for treatment or other patient management decisions. Negative results must be combined with clinical observations, patient history, and epidemiological information. The expected result is Negative. Fact Sheet for Patients: SugarRoll.be Fact Sheet for Healthcare Providers: https://www.woods-mathews.com/ This test is not yet approved or cleared by the [place] FDA and  has been authorized for detection and/or diagnosis of SARS-CoV-2 by FDA under an Emergency Use Authorization (EUA). This EUA will remain  in effect (meaning this test can be used) for the duration of the COVID-19 declaration under Section 56 4(b)(1) of the Act, 21 U.S.C. section 360bbb-3(b)(1), unless the authorization is terminated or revoked sooner. Performed at [facility], [place] [address] [place], [place] [address]      Radiology Studies: DG Abd 1 View  Result Date: 01/07/2020 CLINICAL DATA:  Lower abdominal pain and diarrhea for 5 days. C difficile colitis. EXAM: ABDOMEN - 1 VIEW COMPARISON:  01/05/2020 FINDINGS: There is persistent mucosal edema in the cecum and transverse portion of the colon. Overall diminished air in the bowel. No dilated small bowel. Stomach is not distended. Multiple right renal calculi noted. No acute bone abnormality. IMPRESSION: Persistent mucosal edema in the cecum and transverse portion of the colon consistent with colitis. Electronically Signed   By: Lorriane Shire M.D.   On: 01/07/2020 09:06    Scheduled Meds: . amLODipine  2.5 mg Oral QPM  . aspirin  81 mg Oral Daily  . atenolol  50 mg Oral Daily   And  . chlorthalidone  25 mg Oral Daily  . diclofenac Sodium  4 g Topical QID  . enoxaparin (LOVENOX) injection  40 mg Subcutaneous Q24H  . finasteride  5 mg Oral Daily  . latanoprost  1  drop Both Eyes QHS  . mirtazapine  30 mg Oral QHS  . nystatin  5 mL Oral QID  . tamsulosin  0.4 mg Oral Daily  . timolol  1 drop Both Eyes Daily  . vancomycin  125 mg Oral QID  . vancomycin (VANCOCIN) rectal ENEMA  500 mg Rectal Q6H   Continuous Infusions:    LOS: 5 days   Time spent: 40 minutes.  Lorella Nimrod, MD [facility]  If 7PM-7AM, please contact night-coverage [email or web address]  01/08/2020, 4:56 PM   This record has been created using Systems analyst. Errors have been sought and corrected,but may not always be located. Such creation errors do not reflect on the standard of care.

## 2020-01-09 LAB — COMPREHENSIVE METABOLIC PANEL
ALT: 30 U/L (ref 0–44)
AST: 17 U/L (ref 15–41)
Albumin: 2.1 g/dL — ABNORMAL LOW (ref 3.5–5.0)
Alkaline Phosphatase: 70 U/L (ref 38–126)
Anion gap: 8 (ref 5–15)
BUN: 19 mg/dL (ref 8–23)
CO2: 25 mmol/L (ref 22–32)
Calcium: 7.6 mg/dL — ABNORMAL LOW (ref 8.9–10.3)
Chloride: 99 mmol/L (ref 98–111)
Creatinine, Ser: 0.83 mg/dL (ref 0.61–1.24)
GFR calc Af Amer: >60 mL/min (ref >60)
GFR calc non Af Amer: >60 mL/min (ref >60)
Glucose, Bld: 106 mg/dL — ABNORMAL HIGH (ref 70–99)
Potassium: 3.3 mmol/L — ABNORMAL LOW (ref 3.5–5.1)
Sodium: 132 mmol/L — ABNORMAL LOW (ref 135–145)
Total Bilirubin: 1.8 mg/dL — ABNORMAL HIGH (ref 0.3–1.2)
Total Protein: 4.8 g/dL — ABNORMAL LOW (ref 6.5–8.1)

## 2020-01-09 LAB — CBC
HCT: 38.9 % — ABNORMAL LOW (ref 39.0–52.0)
Hemoglobin: 13.7 g/dL (ref 13.0–17.0)
MCH: 31.9 pg (ref 26.0–34.0)
MCHC: 35.2 g/dL (ref 30.0–36.0)
MCV: 90.7 fL (ref 80.0–100.0)
Platelets: 289 10*3/uL (ref 150–400)
RBC: 4.29 MIL/uL (ref 4.22–5.81)
RDW: 14 % (ref 11.5–15.5)
WBC: 17.4 10*3/uL — ABNORMAL HIGH (ref 4.0–10.5)
nRBC: 0 % (ref 0.0–0.2)

## 2020-01-09 MED ORDER — VANCOMYCIN 50 MG/ML ORAL SOLUTION: 125.0000 mg | Freq: Four times a day (QID) | ORAL | 0 refills | Status: AC

## 2020-01-09 MED ORDER — POTASSIUM CHLORIDE 10 MEQ/100ML IV SOLN
10.0000 meq | INTRAVENOUS | Status: AC
  Administered 2020-01-09 (×4): 10 meq via INTRAVENOUS
  Filled 2020-01-09 (×4): qty 100

## 2020-01-09 MED ORDER — ACETAMINOPHEN 325 MG PO TABS
650.0000 mg | ORAL_TABLET | Freq: Four times a day (QID) | ORAL | Status: DC | PRN
  Administered 2020-01-09 (×2): 650 mg via ORAL
  Filled 2020-01-09 (×2): qty 2

## 2020-01-09 NOTE — Discharge Summary (Signed)
Physician Discharge Summary  Brett Bender [id number] DOB: 01/19/1946 DOA: 01/03/2020  PCP: Birdie Sons, MD  Admit date: 01/03/2020 Discharge date: 01/09/2020  Recommendations for Outpatient Follow-up:  Discharge to home. Wash hands frequently. Clean any surfaces soiled with stool with bleach. Follow up with PCP in 7-10 days. Have chemistry checked on that visit. Follow up with orthopedic surgery as directed.  Discharge Diagnoses: Principal diagnosis is #1 1. C Diff Colitis 2. Thrush 3. Hypertension 4. Hypokalemia 5. Hyperlipidemia 6. S/P Biceps tendone repair 7. Acute kidney injury  Discharge Condition: Fair  Disposition: Home  Diet recommendation: Heart healthy  Filed Weights   01/03/20 1340  Weight: 97.5 kg    History of present illness:  Brett Bender is a 74 y.o. male with medical history significant for Hx of hypertension, BPH, and recent tricep tendon repair who presents for concerns of diarrhea for at least the past week. The patient states that he had been placed on antibiotics related to his biceps tendon surgery.  Diarrhea is non-bloody and could go up to 3 episodes per day.  For the past 2 days he has noticed some lower abdominal pain.Not worse with food.  He also noticed decreased urinary output and has not been able to stay hydrated.  He denies any fevers but has had bad chills not improved with Tylenol.  Denies any nausea vomiting.  He has had decreased appetite. Denies any sick contact. No travel. Thinks he recently was given antibiotics several weeks ago for his left elbow surgery but I do not see any records of this.   Hospital Course:  The patient was admitted to a telemetry bed. Infectious disease was consulted. The patient was treated with Vancomycin enemas q 6 hours 4 days. He was also given oral vancomycin. His renal insufficiency, nausea, and diarrhea have subsided and vancomycin enemas have been discontinued.   Today's assessment: S: The  patient is feeling better. He had one loose BM this morning, but none since. He is tolerating his meals well. O: Vitals:  Vitals:   01/09/20 0929 01/09/20 1511  BP: 120/79 133/83  Pulse: 62 (!) 57  Resp:    Temp:    SpO2:  100%   Exam:  Constitutional:  . The patient is awake, alert, and oriented x 3. No acute distress. Respiratory:  . No increased work of breathing. . No wheezes, rales, or rhonchi . No tactile fremitus Cardiovascular:  . Regular rate and rhythm . No murmurs, ectopy, or gallups. . No lateral PMI. No thrills. Abdomen:  . Abdomen is soft, non-tender, non-distended . No hernias, masses, or organomegaly . Normoactive bowel sounds.  Musculoskeletal:  . No cyanosis, clubbing, or edema Skin:  . No rashes, lesions, ulcers . palpation of skin: no induration or nodules Neurologic:  . CN 2-12 intact . Sensation all 4 extremities intact Psychiatric:  . Mental status o Mood, affect appropriate o Orientation to person, place, time  . judgment and insight appear intact  Discharge Instructions  Discharge Instructions    Activity as tolerated - No restrictions   Complete by: As directed    Call MD for:  persistant dizziness or light-headedness   Complete by: As directed    Call MD for:  persistant nausea and vomiting   Complete by: As directed    Call MD for:  temperature >100.4   Complete by: As directed    Diet - low sodium heart healthy   Complete by: As directed    Discharge  instructions   Complete by: As directed    Discharge to home. Wash hands frequently. Clean any surfaces soiled with stool with bleach. Follow up with PCP in 7-10 days. Have chemistry checked on that visit. Follow up with orthopedic surgery as directed.   Increase activity slowly   Complete by: As directed      Allergies as of 01/09/2020      Reactions   Penicillins Other (See Comments)   Stomach Ache      Medication List    STOP taking these medications   diclofenac 50  MG EC tablet Commonly known as: VOLTAREN     TAKE these medications   acetaminophen 500 MG tablet Commonly known as: TYLENOL Take 2 tablets (1,000 mg total) by mouth every 8 (eight) hours.   ALPRAZolam 0.5 MG tablet Commonly known as: XANAX Take 0.5 mg by mouth at bedtime as needed for anxiety.   amLODipine 2.5 MG tablet Commonly known as: NORVASC TAKE 1 TABLET BY MOUTH ONCE DAILY IN THEEVENING What changed: See the new instructions.   aspirin 81 MG chewable tablet Chew by mouth daily.   atenolol-chlorthalidone 50-25 MG tablet Commonly known as: TENORETIC TAKE 1/2 TABLET BY MOUTH ONCE DAILY   DIGESTIVE ADVANTAGE PO Take 1 tablet by mouth daily.   finasteride 5 MG tablet Commonly known as: PROSCAR Take 1 tablet (5 mg total) by mouth daily.   latanoprost 0.005 % ophthalmic solution Commonly known as: XALATAN Place 1 drop into both eyes at bedtime.   mirtazapine 30 MG tablet Commonly known as: REMERON Take 1 tablet (30 mg total) by mouth at bedtime.   multivitamin tablet Take 1 tablet by mouth daily.   ondansetron 4 MG disintegrating tablet Commonly known as: Zofran ODT Take 1 tablet (4 mg total) by mouth every 8 (eight) hours as needed for nausea or vomiting.   psyllium 58.6 % powder Commonly known as: METAMUCIL Take 1 packet by mouth daily as needed (regularity).   tamsulosin 0.4 MG Caps capsule Commonly known as: FLOMAX Take 1 capsule (0.4 mg total) by mouth daily.   timolol 0.5 % ophthalmic solution Commonly known as: TIMOPTIC Place 1 drop into both eyes daily.   vancomycin 50 mg/mL  oral solution Commonly known as: VANCOCIN Take 2.5 mLs (125 mg total) by mouth 4 (four) times daily for 3 days.      Allergies  Allergen Reactions  . Penicillins Other (See Comments)    Stomach Ache    The results of significant diagnostics from this hospitalization (including imaging, microbiology, ancillary and laboratory) are listed below for reference.     Significant Diagnostic Studies: DG Abd 1 View  Result Date: 01/07/2020 CLINICAL DATA:  Lower abdominal pain and diarrhea for 5 days. C difficile colitis. EXAM: ABDOMEN - 1 VIEW COMPARISON:  01/05/2020 FINDINGS: There is persistent mucosal edema in the cecum and transverse portion of the colon. Overall diminished air in the bowel. No dilated small bowel. Stomach is not distended. Multiple right renal calculi noted. No acute bone abnormality. IMPRESSION: Persistent mucosal edema in the cecum and transverse portion of the colon consistent with colitis. Electronically Signed   By: Lorriane Shire M.D.   On: 01/07/2020 09:06   DG Abd 1 View  Result Date: 01/05/2020 CLINICAL DATA:  74 year old male with C difficile colitis, diarrhea, vomiting and abdominal pain EXAM: ABDOMEN - 1 VIEW COMPARISON:  Prior abdominal radiographs 01/03/2010 FINDINGS: Progressive gaseous distension of the colon. The descending and sigmoid colon demonstrate diffuse submucosal thickening  with areas of thumbprinting most consistent with submucosal edema. No evidence of small-bowel obstruction. No large free air on this supine series. IMPRESSION: 1. Diffuse submucosal edema along the descending and sigmoid colon consistent with the clinical history of C difficile colitis. 2. Progressive gaseous distension of the transverse colon. Electronically Signed   By: Jacqulynn Cadet M.D.   On: 01/05/2020 12:51   DG Abd 1 View  Result Date: 01/04/2020 CLINICAL DATA:  C diff infection, diarrhea EXAM: ABDOMEN - 1 VIEW COMPARISON:  04/16/2019 FINDINGS: There is air and stool throughout the colon without significant distention. Bowel gas pattern is otherwise unremarkable. No acute osseous abnormality. IMPRESSION: Air and stool throughout the colon without significant distention. Electronically Signed   By: Macy Mis M.D.   On: 01/04/2020 12:01   CT HEAD WO CONTRAST  Result Date: 01/05/2020 CLINICAL DATA:  Ataxia.  Fall with head trauma.  EXAM: CT HEAD WITHOUT CONTRAST TECHNIQUE: Contiguous axial images were obtained from the base of the skull through the vertex without intravenous contrast. COMPARISON:  07/20/2019 FINDINGS: Brain: Mild age related volume loss. No evidence of old or acute focal infarction, mass lesion, hemorrhage, hydrocephalus or extra-axial collection. Vascular: There is atherosclerotic calcification of the major vessels at the base of the brain. Skull: Negative Sinuses/Orbits: Clear/normal Other: None IMPRESSION: No acute or traumatic finding. Age related volume loss. Atherosclerotic calcification of major vessels at the base of brain. Electronically Signed   By: Nelson Chimes M.D.   On: 01/05/2020 00:11    Microbiology: Recent Results (from the past 240 hour(s))  GI pathogen panel by PCR, stool     Status: None   Collection Time: 01/03/20  5:23 PM   Specimen: Stool  Result Value Ref Range Status   Plesiomonas shigelloides NOT DETECTED NOT DETECTED Final   Yersinia enterocolitica NOT DETECTED NOT DETECTED Final   Vibrio NOT DETECTED NOT DETECTED Final   Enteropathogenic E coli NOT DETECTED NOT DETECTED Final   E coli (ETEC) LT/ST NOT DETECTED NOT DETECTED Final   E coli [id number] by PCR Not applicable NOT DETECTED Final   Cryptosporidium by PCR NOT DETECTED NOT DETECTED Final   Entamoeba histolytica NOT DETECTED NOT DETECTED Final   Adenovirus F 40/41 NOT DETECTED NOT DETECTED Final   Norovirus GI/GII NOT DETECTED NOT DETECTED Final   Sapovirus NOT DETECTED NOT DETECTED Final    Comment: (NOTE) Performed At: [facility] [place], [place] [id number] Rush Farmer MD [id number]    Vibrio cholerae NOT DETECTED NOT DETECTED Final   Campylobacter by PCR NOT DETECTED NOT DETECTED Final   Salmonella by PCR NOT DETECTED NOT DETECTED Final   E coli (STEC) NOT DETECTED NOT DETECTED Final   Enteroaggregative E coli NOT DETECTED NOT DETECTED Final   Shigella by PCR NOT DETECTED NOT DETECTED  Final   Cyclospora cayetanensis NOT DETECTED NOT DETECTED Final   Astrovirus NOT DETECTED NOT DETECTED Final   G lamblia by PCR NOT DETECTED NOT DETECTED Final   Rotavirus A by PCR NOT DETECTED NOT DETECTED Final  C Difficile Quick Screen w PCR reflex     Status: Abnormal   Collection Time: 01/03/20  5:23 PM   Specimen: Stool  Result Value Ref Range Status   C Diff antigen POSITIVE (A) NEGATIVE Final   C Diff toxin POSITIVE (A) NEGATIVE Final   C Diff interpretation Toxin producing C. difficile detected.  Final    Comment: CRITICAL RESULT CALLED TO, READ BACK BY AND VERIFIED  WITH: Verneda Skill 01/03/20 @ 1825  [place] Performed at [facility], [place], [place] [address]   SARS CORONAVIRUS 2 (TAT 6-24 HRS) Nasopharyngeal Nasopharyngeal Swab     Status: None   Collection Time: 01/03/20  5:26 PM   Specimen: Nasopharyngeal Swab  Result Value Ref Range Status   SARS Coronavirus 2 NEGATIVE NEGATIVE Final    Comment: (NOTE) SARS-CoV-2 target nucleic acids are NOT DETECTED. The SARS-CoV-2 RNA is generally detectable in upper and lower respiratory specimens during the acute phase of infection. Negative results do not preclude SARS-CoV-2 infection, do not rule out co-infections with other pathogens, and should not be used as the sole basis for treatment or other patient management decisions. Negative results must be combined with clinical observations, patient history, and epidemiological information. The expected result is Negative. Fact Sheet for Patients: SugarRoll.be Fact Sheet for Healthcare Providers: https://www.woods-mathews.com/ This test is not yet approved or cleared by the [place] FDA and  has been authorized for detection and/or diagnosis of SARS-CoV-2 by FDA under an Emergency Use Authorization (EUA). This EUA will remain  in effect (meaning this test can be used) for the duration of the COVID-19  declaration under Section 56 4(b)(1) of the Act, 21 U.S.C. section 360bbb-3(b)(1), unless the authorization is terminated or revoked sooner. Performed at [facility], [place] [address], [place], [place] [address]      Labs: Basic Metabolic Panel: Recent Labs  Lab 01/05/20 [phone] 01/05/20 [id number] 01/06/20 0429 01/07/20 0425 01/08/20 0544 01/09/20 0415  NA 135  --  135 135 135 132*  K 3.3*  --  3.6 3.7 3.4* 3.3*  CL 107  --  106 107 103 99  CO2 19*  --  20* 20* 23 25  GLUCOSE 158*  --  140* 131* 104* 106*  BUN 46*  --  38* 29* 23 19  CREATININE 1.30*  --  1.09 0.94 0.86 0.83  CALCIUM 7.6*  --  7.6* 7.6* 7.5* 7.6*  MG  --  2.6*  --   --   --   --    Liver Function Tests: Recent Labs  Lab 01/05/20 0528 01/06/20 0429 01/07/20 0425 01/08/20 0544 01/09/20 0415  AST 26 30 32 22 17  ALT 29 39 47* 38 30  ALKPHOS 121 82 86 72 70  BILITOT 4.1* 3.7* 3.2* 2.3* 1.8*  PROT 5.6* 5.1* 4.9* 4.8* 4.8*  ALBUMIN 2.6* 2.3* 2.2* 2.1* 2.1*   Recent Labs  Lab 01/03/20 1347  LIPASE 12   No results for input(s): AMMONIA in the last 168 hours. CBC: Recent Labs  Lab 01/05/20 0528 01/06/20 0429 01/07/20 0425 01/08/20 0544 01/09/20 0415  WBC 30.2* 29.5* 26.1* 20.4* 17.4*  HGB 14.1 14.0 14.3 13.9 13.7  HCT 40.7 40.5 41.2 40.2 38.9*  MCV 91.3 90.8 91.4 92.6 90.7  PLT 216 217 233 244 289   Cardiac Enzymes: No results for input(s): CKTOTAL, CKMB, CKMBINDEX, TROPONINI in the last 168 hours. BNP: BNP (last 3 results) No results for input(s): BNP in the last 8760 hours.  ProBNP (last 3 results) Recent Labs    06/28/19 1440  PROBNP 178    CBG: No results for input(s): GLUCAP in the last 168 hours.  Principal Problem:   Clostridium difficile colitis Active Problems:   Essential (primary) hypertension   BPH (benign prostatic hyperplasia)   Diarrhea   AKI (acute kidney injury) ([place])   Hyperbilirubinemia   Time coordinating discharge: 38 minutes  Signed:  Clif Serio, DO [facility]  01/09/2020, 3:50 PM

## 2020-01-09 NOTE — Progress Notes (Signed)
Brett Bender and O x4. VSS. Pt tolerating diet well. No complaints of nausea or vomiting. IV removed intact, prescriptions given. Pt voices understanding of discharge instructions with no further questions. Patient discharged via wheelchair with NT  Allergies as of 01/09/2020      Reactions   Penicillins Other (See Comments)   Stomach Ache      Medication List    STOP taking these medications   diclofenac 50 MG EC tablet Commonly known as: VOLTAREN     TAKE these medications   acetaminophen 500 MG tablet Commonly known as: TYLENOL Take 2 tablets (1,000 mg total) by mouth every 8 (eight) hours. Notes to patient: Before bed 01/09/20   ALPRAZolam 0.5 MG tablet Commonly known as: XANAX Take 0.5 mg by mouth at bedtime as needed for anxiety. Notes to patient: As needed   amLODipine 2.5 MG tablet Commonly known as: NORVASC TAKE 1 TABLET BY MOUTH ONCE DAILY IN THEEVENING What changed: See the new instructions. Notes to patient: Evening 01/09/20   aspirin 81 MG chewable tablet Chew by mouth daily. Notes to patient: Morning 01/10/20   atenolol-chlorthalidone 50-25 MG tablet Commonly known as: TENORETIC TAKE 1/2 TABLET BY MOUTH ONCE DAILY Notes to patient: Morning 01/10/20   DIGESTIVE ADVANTAGE PO Take 1 tablet by mouth daily. Notes to patient: Morning 01/10/20   finasteride 5 MG tablet Commonly known as: PROSCAR Take 1 tablet (5 mg total) by mouth daily. Notes to patient: Morning 01/10/20   latanoprost 0.005 % ophthalmic solution Commonly known as: XALATAN Place 1 drop into both eyes at bedtime. Notes to patient: Before bed 01/09/20   mirtazapine 30 MG tablet Commonly known as: REMERON Take 1 tablet (30 mg total) by mouth at bedtime. Notes to patient: Before bed 01/09/20   multivitamin tablet Take 1 tablet by mouth daily. Notes to patient: Morning 01/10/20   ondansetron 4 MG disintegrating tablet Commonly known as: Zofran ODT Take 1 tablet (4 mg total) by mouth every 8 (eight)  hours as needed for nausea or vomiting. Notes to patient: As needed   psyllium 58.6 % powder Commonly known as: METAMUCIL Take 1 packet by mouth daily as needed (regularity). Notes to patient: As needed   tamsulosin 0.4 MG Caps capsule Commonly known as: FLOMAX Take 1 capsule (0.4 mg total) by mouth daily. Notes to patient: Morning 01/10/20   timolol 0.5 % ophthalmic solution Commonly known as: TIMOPTIC Place 1 drop into both eyes daily. Notes to patient: Morning 01/10/20   vancomycin 50 mg/mL  oral solution Commonly known as: VANCOCIN Take 2.5 mLs (125 mg total) by mouth 4 (four) times daily for 3 days. Notes to patient: Evening 01/09/20       Vitals:   01/09/20 0929 01/09/20 1511  BP: 120/79 133/83  Pulse: 62 (!) 57  Resp:    Temp:    SpO2:  100%    Brett Bender

## 2020-01-10 ENCOUNTER — Telehealth: Payer: Self-pay

## 2020-01-10 NOTE — Telephone Encounter (Signed)
Transition Care Management Follow-up Telephone Call  Date of discharge and from where: [facility] on 01/09/20  How have you been since you were released from the hospital? Doing ok, still having diarrhea at times but not as often as previously. Pt is currently on a soft diet. Abdominal pain is better but still sore to the touch. Declines fever, dizziness or n/v. Pt is drinking plenty of water.   Any questions or concerns? No   Items Reviewed:  Did the pt receive and understand the discharge instructions provided? Yes   Medications obtained and verified? Yes   Any new allergies since your discharge? No   Dietary orders reviewed? Yes  Do you have support at home? Yes   Other (ie: DME, Home Health, etc) N/A  Functional Questionnaire: (I = Independent and D = Dependent)  Bathing/Dressing- I   Meal Prep- I  Eating- I  Maintaining continence- Has had some leakage with BMs due to diarrhea.  Transferring/Ambulation- I  Managing Meds- I   Follow up appointments reviewed:    PCP Hospital f/u appt confirmed? Yes  Scheduled to see Dr Caryn Section on 01/14/20 @ 3:00 PM.  [facility] f/u appt confirmed? N/A  Are transportation arrangements needed? No   If their condition worsens, is the pt aware to call  their PCP or go to the ED? Yes  Was the patient provided with contact information for the PCP's office or ED? Yes  Was the pt encouraged to call back with questions or concerns? Yes

## 2020-01-10 NOTE — Telephone Encounter (Signed)
No HFU scheduled at this time. 

## 2020-01-14 ENCOUNTER — Ambulatory Visit (INDEPENDENT_AMBULATORY_CARE_PROVIDER_SITE_OTHER): Payer: Medicare Other | Admitting: Family Medicine

## 2020-01-14 ENCOUNTER — Encounter: Payer: Self-pay | Admitting: Family Medicine

## 2020-01-14 ENCOUNTER — Other Ambulatory Visit: Payer: Self-pay

## 2020-01-14 VITALS — BP 100/60 | HR 65 | Temp 97.5°F | Resp 18 | Wt 208.0 lb

## 2020-01-14 DIAGNOSIS — I10 Essential (primary) hypertension: Secondary | ICD-10-CM | POA: Diagnosis not present

## 2020-01-14 DIAGNOSIS — E876 Hypokalemia: Secondary | ICD-10-CM

## 2020-01-14 DIAGNOSIS — A0472 Enterocolitis due to Clostridium difficile, not specified as recurrent: Secondary | ICD-10-CM | POA: Diagnosis not present

## 2020-01-14 NOTE — Patient Instructions (Addendum)
.   Please review the attached list of medications and notify my office if there are any errors.   . Please bring all of your medications to every appointment so we can make sure that our medication list is the same as yours.    Put the amlodipine on hold for the next 7 days due to low blood pressure and weakness   You did have a dose of clindamycin when you had your surgery. Although clindamycin usually only causes c.diff if taken over the course of several days, I would still recommend avoiding it or taking extra precautions if you need to take that antibiotic in the future.

## 2020-01-14 NOTE — Progress Notes (Signed)
Patient: Brett Bender Male    DOB: 29-May-1946   74 y.o.   MRN: [id number] Visit Date: 01/14/2020  Today's Provider: Lelon Huh, MD   Chief Complaint  Patient presents with  . Hospitalization Follow-up   Subjective:     HPI  Follow up Hospitalization  Patient was admitted to [facility] on 01/03/2020 and discharged on 01/09/2020.              He was treated for:     Essential (primary) hypertension BPH (benign prostatic hyperplasia) Diarrhea AKI (acute kidney injury) ([place]) Hyperbilirubinemia Clostridium difficile colitis       Treatment for this included START taking: vancomycin 50 mg/mL       oral solution (VANCOCIN). Telephone follow up was done on 01/10/2020 He reports good compliance with treatment. He reports this condition is Improved. s Is still very fatigued, but not had any loose BMs since hospital discharge and tolerating regular diet.  ------------------------------------------------------------------------------------    Allergies  Allergen Reactions  . Penicillins Other (See Comments)    Stomach Ache     Current Outpatient Medications:  .  acetaminophen (TYLENOL) 500 MG tablet, Take 2 tablets (1,000 mg total) by mouth every 8 (eight) hours., Disp: 90 tablet, Rfl: 2 .  ALPRAZolam (XANAX) 0.5 MG tablet, Take 0.5 mg by mouth at bedtime as needed for anxiety., Disp: , Rfl:  .  amLODipine (NORVASC) 2.5 MG tablet, TAKE 1 TABLET BY MOUTH ONCE DAILY IN THEEVENING (Patient taking differently: Take 2.5 mg by mouth every evening. ), Disp: 90 tablet, Rfl: 1 .  aspirin 81 MG chewable tablet, Chew by mouth daily., Disp: , Rfl:  .  atenolol-chlorthalidone (TENORETIC) 50-25 MG tablet, TAKE 1/2 TABLET BY MOUTH ONCE DAILY (Patient taking differently: Take 0.5 tablets by mouth daily. ), Disp: 45 tablet, Rfl: 11 .  finasteride (PROSCAR) 5 MG tablet, Take 1 tablet (5 mg total) by mouth daily., Disp: 90 tablet, Rfl: 3 .  latanoprost (XALATAN) 0.005 % ophthalmic solution, Place  1 drop into both eyes at bedtime. , Disp: , Rfl:  .  mirtazapine (REMERON) 30 MG tablet, Take 1 tablet (30 mg total) by mouth at bedtime., Disp: 30 tablet, Rfl: 12 .  Multiple Vitamin (MULTIVITAMIN) tablet, Take 1 tablet by mouth daily., Disp: , Rfl:  .  ondansetron (ZOFRAN ODT) 4 MG disintegrating tablet, Take 1 tablet (4 mg total) by mouth every 8 (eight) hours as needed for nausea or vomiting., Disp: 20 tablet, Rfl: 0 .  Probiotic Product (DIGESTIVE ADVANTAGE PO), Take 1 tablet by mouth daily., Disp: , Rfl:  .  psyllium (METAMUCIL) 58.6 % powder, Take 1 packet by mouth daily as needed (regularity). , Disp: , Rfl:  .  tamsulosin (FLOMAX) 0.4 MG CAPS capsule, Take 1 capsule (0.4 mg total) by mouth daily., Disp: 90 capsule, Rfl: 3 .  timolol (TIMOPTIC) 0.5 % ophthalmic solution, Place 1 drop into both eyes daily. , Disp: , Rfl:   Review of Systems  Constitutional: Positive for fatigue. Negative for appetite change, chills and fever.  Respiratory: Negative for chest tightness, shortness of breath and wheezing.   Cardiovascular: Negative for chest pain and palpitations.  Gastrointestinal: Negative for abdominal pain, nausea and vomiting.  Neurological: Positive for weakness.    Social History   Tobacco Use  . Smoking status: Never Smoker  . Smokeless tobacco: Never Used  Substance Use Topics  . Alcohol use: No    Alcohol/week: 0.0 standard drinks  Objective:   BP 100/60 (BP Location: Left Arm, Patient Position: Sitting, Cuff Size: Large)   Pulse 65   Temp (!) 97.5 F (36.4 C) (Temporal)   Resp 18   Wt 208 lb (94.3 kg)   SpO2 97% Comment: room air  BMI 26.00 kg/m  Vitals:   01/14/20 1457  BP: 100/60  Pulse: 65  Resp: 18  Temp: (!) 97.5 F (36.4 C)  TempSrc: Temporal  SpO2: 97%  Weight: 208 lb (94.3 kg)  Body mass index is 26 kg/m.  Wt Readings from Last 3 Encounters:  01/14/20 208 lb (94.3 kg)  01/03/20 215 lb (97.5 kg)  01/01/20 218 lb 9.6 oz (99.2 kg)      Physical Exam   General: Appearance:     Well developed, well nourished male in no acute distress  Eyes:    PERRL, conjunctiva/corneas clear, EOM's intact       Lungs:     Clear to auscultation bilaterally, respirations unlabored  Heart:    Normal heart rate. Normal rhythm. No murmurs, rubs, or gallops.   MS:   All extremities are intact.   Neurologic:   Awake, alert, oriented x 3. No apparent focal neurological           defect.           Assessment & Plan    1. Clostridium difficile colitis Diarrhea has resolved, counseled that there is occasional recurrences and to call if he has any change in BM that lasts more than a day.  - Comprehensive metabolic panel - CBC  2. Essential (primary) hypertension Is a little hypotensive since hospitalization. Is to put amlodipine on hold for 1 week, then resume.  - Comprehensive metabolic panel - CBC  3. Hypokalemia Likely secondary to GI losses. Recheck to make sure has resolved.  - Comprehensive metabolic panel - CBC     Lelon Huh, MD  [facility]

## 2020-01-15 ENCOUNTER — Telehealth: Payer: Self-pay

## 2020-01-15 LAB — COMPREHENSIVE METABOLIC PANEL
ALT: 23 IU/L (ref 0–44)
AST: 19 IU/L (ref 0–40)
Albumin/Globulin Ratio: 1 — ABNORMAL LOW (ref 1.2–2.2)
Albumin: 2.6 g/dL — ABNORMAL LOW (ref 3.7–4.7)
Alkaline Phosphatase: 93 IU/L (ref 39–117)
BUN/Creatinine Ratio: 15 (ref 10–24)
BUN: 14 mg/dL (ref 8–27)
Bilirubin Total: 0.5 mg/dL (ref 0.0–1.2)
CO2: 23 mmol/L (ref 20–29)
Calcium: 8.1 mg/dL — ABNORMAL LOW (ref 8.6–10.2)
Chloride: 98 mmol/L (ref 96–106)
Creatinine, Ser: 0.95 mg/dL (ref 0.76–1.27)
GFR calc Af Amer: 91 mL/min/{1.73_m2} (ref >59)
GFR calc non Af Amer: 79 mL/min/{1.73_m2} (ref >59)
Globulin, Total: 2.7 g/dL (ref 1.5–4.5)
Glucose: 101 mg/dL — ABNORMAL HIGH (ref 65–99)
Potassium: 4.4 mmol/L (ref 3.5–5.2)
Sodium: 132 mmol/L — ABNORMAL LOW (ref 134–144)
Total Protein: 5.3 g/dL — ABNORMAL LOW (ref 6.0–8.5)

## 2020-01-15 LAB — CBC
Hematocrit: 42.2 % (ref 37.5–51.0)
Hemoglobin: 14.2 g/dL (ref 13.0–17.7)
MCH: 32.1 pg (ref 26.6–33.0)
MCHC: 33.6 g/dL (ref 31.5–35.7)
MCV: 95 fL (ref 79–97)
Platelets: 507 10*3/uL — ABNORMAL HIGH (ref 150–450)
RBC: 4.43 x10E6/uL (ref 4.14–5.80)
RDW: 12.4 % (ref 11.6–15.4)
WBC: 16.2 10*3/uL — ABNORMAL HIGH (ref 3.4–10.8)

## 2020-01-15 NOTE — Telephone Encounter (Signed)
-----   Message from Birdie Sons, MD sent at 01/15/2020  7:47 AM EST ----- Is a little malnourished, but otherwise labs are good. Is ok to get covid vaccine tomorrow. Can take OTC tylenol if he has any aching or chills after getting the vaccine. Should feel much better over the next 7-10 days.

## 2020-01-15 NOTE — Telephone Encounter (Signed)
Patient's wife Jarrett Soho (on [place]) advised.

## 2020-01-21 ENCOUNTER — Other Ambulatory Visit: Payer: Self-pay | Admitting: Family Medicine

## 2020-01-21 NOTE — Telephone Encounter (Signed)
LOV 02/03/20 :RF

## 2020-01-21 NOTE — Telephone Encounter (Signed)
Requested medication (s) are due for refill today - yes if too continue  Requested medication (s) are on the active medication list -no  Future visit scheduled -yes  Last refill: 10/08/19  Notes to clinic: Patient is requesting refill of medication written over 1 year ago- sent for reveiw  Requested Prescriptions  Pending Prescriptions Disp Refills   diclofenac (VOLTAREN) 50 MG EC tablet [Pharmacy Med Name: DICLOFENAC SODIUM 50 MG TAB] 180 tablet 4    Sig: TAKE 1 TABLET BY MOUTH TWICE (2) DAILY      Analgesics:  NSAIDS Passed - 01/21/2020 11:42 AM      Passed - Cr in normal range and within 360 days    Creatinine  Date Value Ref Range Status  02/27/2012 0.93 0.60 - 1.30 mg/dL Final   Creatinine, Ser  Date Value Ref Range Status  01/14/2020 0.95 0.76 - 1.27 mg/dL Final          Passed - HGB in normal range and within 360 days    Hemoglobin  Date Value Ref Range Status  01/14/2020 14.2 13.0 - 17.7 g/dL Final          Passed - Patient is not pregnant      Passed - Valid encounter within last 12 months    Recent Outpatient Visits           1 week ago Clostridium difficile colitis   [facility] Birdie Sons, MD   2 weeks ago Essential (primary) hypertension   [facility], Kirstie Peri, MD   5 months ago Bell's palsy   [facility] Birdie Sons, MD   6 months ago Pneumonia of left lower lobe due to infectious organism [facility])   [facility] Birdie Sons, MD   6 months ago Shortness of breath   [facility] Jerrol Banana., MD       Future Appointments             In 1 week Ralene Bathe, MD [place]   In 4 months McGowan, Hunt Oris, PA-C [place]   In 5 months Fisher, Kirstie Peri, MD [facility], [facility]                Requested Prescriptions  Pending Prescriptions Disp Refills   diclofenac (VOLTAREN) 50 MG EC  tablet [Pharmacy Med Name: DICLOFENAC SODIUM 50 MG TAB] 180 tablet 4    Sig: TAKE 1 TABLET BY MOUTH TWICE (2) DAILY      Analgesics:  NSAIDS Passed - 01/21/2020 11:42 AM      Passed - Cr in normal range and within 360 days    Creatinine  Date Value Ref Range Status  02/27/2012 0.93 0.60 - 1.30 mg/dL Final   Creatinine, Ser  Date Value Ref Range Status  01/14/2020 0.95 0.76 - 1.27 mg/dL Final          Passed - HGB in normal range and within 360 days    Hemoglobin  Date Value Ref Range Status  01/14/2020 14.2 13.0 - 17.7 g/dL Final          Passed - Patient is not pregnant      Passed - Valid encounter within last 12 months    Recent Outpatient Visits           1 week ago Clostridium difficile colitis   [facility] Birdie Sons, MD   2 weeks ago Essential (  primary) hypertension   [facility] Birdie Sons, MD   5 months ago Bell's palsy   [facility] Birdie Sons, MD   6 months ago Pneumonia of left lower lobe due to infectious organism [facility])   [facility] Birdie Sons, MD   6 months ago Shortness of breath   [facility] Jerrol Banana., MD       Future Appointments             In 1 week Ralene Bathe, MD [place]   In 4 months McGowan, Gordan Payment [place]   In 5 months Fisher, Kirstie Peri, MD [facility], [place]

## 2020-01-21 NOTE — Telephone Encounter (Signed)
This medication had been d/c'ed.  PEC pulled from historical meds

## 2020-01-22 DIAGNOSIS — R29898 Other symptoms and signs involving the musculoskeletal system: Secondary | ICD-10-CM | POA: Diagnosis not present

## 2020-01-22 DIAGNOSIS — M25521 Pain in right elbow: Secondary | ICD-10-CM | POA: Diagnosis not present

## 2020-01-25 ENCOUNTER — Telehealth: Payer: Self-pay

## 2020-01-25 DIAGNOSIS — A09 Infectious gastroenteritis and colitis, unspecified: Secondary | ICD-10-CM

## 2020-01-25 MED ORDER — VANCOMYCIN HCL 125 MG PO CAPS: 125.0000 mg | ORAL_CAPSULE | Freq: Four times a day (QID) | ORAL | 0 refills | Status: AC

## 2020-01-25 NOTE — Addendum Note (Signed)
Addended by: Lelon Huh E on: 01/25/2020 10:01 AM   Modules accepted: Orders

## 2020-01-25 NOTE — Addendum Note (Signed)
Addended by: Birdie Sons on: 01/25/2020 02:26 PM   Modules accepted: Orders

## 2020-01-25 NOTE — Telephone Encounter (Signed)
He needs to come in and pick up stool collection kit. If the c.diff is positive he will have to stay on antibiotic for a month. We can send in prescription for 1 weeks worth of vancomycin as soon as picks up stool collection kit.

## 2020-01-25 NOTE — Telephone Encounter (Signed)
Patient has hx of C. Diff.  Began having diarrhea again 2 days ago.  Would like to have the antibiotic sent in again.   [place].  CB#  [phone]

## 2020-01-25 NOTE — Telephone Encounter (Signed)
LMTCB, okay for PEC to advise patient and route back to office so stool kit can be left at the front desk for pick up.

## 2020-01-25 NOTE — Telephone Encounter (Signed)
Have sent prescription for vancomycin. He needs to go to lab to get collection kit. He can collect stool sample over the weekend and bring it back on Monday. Needs to be here before denise closes the lab. She can answer any questions about the samples collection

## 2020-01-25 NOTE — Telephone Encounter (Signed)
Patient advised.

## 2020-01-25 NOTE — Telephone Encounter (Signed)
Pt call back and has several question about kit and would like to know if antibiotics  can be refilled  [phone]- best call back number

## 2020-01-25 NOTE — Addendum Note (Signed)
Addended by: Birdie Sons on: 01/25/2020 02:24 PM   Modules accepted: Orders

## 2020-01-29 ENCOUNTER — Other Ambulatory Visit: Payer: Self-pay | Admitting: Family Medicine

## 2020-01-29 DIAGNOSIS — A0472 Enterocolitis due to Clostridium difficile, not specified as recurrent: Secondary | ICD-10-CM

## 2020-01-29 DIAGNOSIS — M25521 Pain in right elbow: Secondary | ICD-10-CM | POA: Diagnosis not present

## 2020-01-29 MED ORDER — VANCOMYCIN HCL 125 MG PO CAPS: ORAL_CAPSULE | ORAL | 0 refills | Status: AC

## 2020-01-30 ENCOUNTER — Other Ambulatory Visit: Payer: Self-pay | Admitting: *Deleted

## 2020-01-30 ENCOUNTER — Other Ambulatory Visit: Payer: Self-pay | Admitting: Urology

## 2020-01-30 DIAGNOSIS — N138 Other obstructive and reflux uropathy: Secondary | ICD-10-CM

## 2020-01-30 DIAGNOSIS — N401 Enlarged prostate with lower urinary tract symptoms: Secondary | ICD-10-CM

## 2020-01-30 MED ORDER — TAMSULOSIN HCL 0.4 MG PO CAPS: 0.4000 mg | ORAL_CAPSULE | Freq: Every day | ORAL | 3 refills | Status: DC

## 2020-01-31 ENCOUNTER — Ambulatory Visit (INDEPENDENT_AMBULATORY_CARE_PROVIDER_SITE_OTHER): Payer: Medicare Other | Admitting: Dermatology

## 2020-01-31 ENCOUNTER — Other Ambulatory Visit: Payer: Self-pay

## 2020-01-31 DIAGNOSIS — L578 Other skin changes due to chronic exposure to nonionizing radiation: Secondary | ICD-10-CM | POA: Diagnosis not present

## 2020-01-31 DIAGNOSIS — D2261 Melanocytic nevi of right upper limb, including shoulder: Secondary | ICD-10-CM | POA: Diagnosis not present

## 2020-01-31 DIAGNOSIS — D225 Melanocytic nevi of trunk: Secondary | ICD-10-CM

## 2020-01-31 DIAGNOSIS — L570 Actinic keratosis: Secondary | ICD-10-CM

## 2020-01-31 DIAGNOSIS — L820 Inflamed seborrheic keratosis: Secondary | ICD-10-CM

## 2020-01-31 DIAGNOSIS — L821 Other seborrheic keratosis: Secondary | ICD-10-CM

## 2020-01-31 DIAGNOSIS — D229 Melanocytic nevi, unspecified: Secondary | ICD-10-CM

## 2020-01-31 DIAGNOSIS — D2262 Melanocytic nevi of left upper limb, including shoulder: Secondary | ICD-10-CM

## 2020-01-31 DIAGNOSIS — Z1283 Encounter for screening for malignant neoplasm of skin: Secondary | ICD-10-CM | POA: Diagnosis not present

## 2020-01-31 NOTE — Progress Notes (Signed)
   Follow-Up Visit   Subjective  Brett Bender is a 74 y.o. male who presents for the following: Actinic Keratosis (face, scalp 6 months f/u ) and other (Pt recently had surgery on his bicep, shortly after that he was hospitalized with C-diff)    The following portions of the chart were reviewed this encounter and updated as appropriate:     Review of Systems: No other skin or systemic complaints.  Objective  Well appearing patient in no apparent distress; mood and affect are within normal limits.  All skin waist up examined.  Objective  Head - Anterior (Face): Diffuse scaly erythematous macules with underlying dyspigmentation.   Objective  Right infraurcular, arms, hands (19): Erythematous keratotic or waxy stuck-on papule or plaque.   Objective  chest, back, arms: Tan-brown and/or pink-flesh-colored symmetric macules and papules.   Objective  chest, back, arms: Stuck-on, waxy, tan-brown papules and plaques -- Discussed benign etiology and prognosis.   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, ears  Destruction of lesion - face, ears Complexity: simple   Destruction method: cryotherapy   Informed consent: discussed and consent obtained   Timeout:  patient name, date of birth, surgical site, and procedure verified Lesion destroyed using liquid nitrogen: Yes   Region frozen until ice ball extended beyond lesion: Yes   Outcome: patient tolerated procedure well with no complications   Post-procedure details: wound care instructions given    Actinic skin damage Head - Anterior (Face)  Recommend daily broad spectrum sunscreen SPF 30+ to sun-exposed areas  Inflamed seborrheic keratosis (19) Right infraurcular, arms, hands  Destruction of lesion - Right infraurcular, arms, hands Complexity: simple   Destruction method: cryotherapy   Informed consent: discussed and consent obtained   Timeout:  patient name, date of birth, surgical site, and procedure  verified Lesion destroyed using liquid nitrogen: Yes   Region frozen until ice ball extended beyond lesion: Yes   Outcome: patient tolerated procedure well with no complications   Post-procedure details: wound care instructions given    Nevus chest, back, arms  Seborrheic keratosis chest, back, arms  The patient will observe these symptoms, and report promptly any worsening or unexpected persistence.  If well, may return prn.   Skin exam, screening for cancer  Return in about 2 months (around 04/01/2020) for ISK recheck R preauricular .   IMarye Round, CMA, am acting as scribe for Sarina Ser, MD .

## 2020-02-01 LAB — FECAL LACTOFERRIN, QUANT: Lactoferrin, Fecal, Quant.: 619.64 ug/mL(g) — ABNORMAL HIGH (ref 0.00–7.24)

## 2020-02-02 LAB — C DIFFICILE, CYTOTOXIN B

## 2020-02-02 LAB — CLOSTRIDIUM DIFFICILE BY PCR: Toxigenic C. Difficile by PCR: POSITIVE — AB

## 2020-02-02 LAB — C DIFFICILE TOXINS A+B W/RFLX: C difficile Toxins A+B, EIA: NEGATIVE

## 2020-02-05 DIAGNOSIS — M25521 Pain in right elbow: Secondary | ICD-10-CM | POA: Diagnosis not present

## 2020-02-12 DIAGNOSIS — M7022 Olecranon bursitis, left elbow: Secondary | ICD-10-CM | POA: Diagnosis not present

## 2020-03-12 DIAGNOSIS — H401131 Primary open-angle glaucoma, bilateral, mild stage: Secondary | ICD-10-CM | POA: Diagnosis not present

## 2020-04-02 ENCOUNTER — Other Ambulatory Visit: Payer: Self-pay

## 2020-04-02 ENCOUNTER — Ambulatory Visit (INDEPENDENT_AMBULATORY_CARE_PROVIDER_SITE_OTHER): Payer: Medicare Other | Admitting: Dermatology

## 2020-04-02 DIAGNOSIS — L578 Other skin changes due to chronic exposure to nonionizing radiation: Secondary | ICD-10-CM

## 2020-04-02 DIAGNOSIS — L820 Inflamed seborrheic keratosis: Secondary | ICD-10-CM

## 2020-04-02 DIAGNOSIS — L570 Actinic keratosis: Secondary | ICD-10-CM

## 2020-04-02 DIAGNOSIS — D692 Other nonthrombocytopenic purpura: Secondary | ICD-10-CM | POA: Diagnosis not present

## 2020-04-02 NOTE — Progress Notes (Signed)
   Follow-Up Visit   Subjective  Brett Bender is a 74 y.o. male who presents for the following: Follow-up (AKs of face and ears treated with LN2 x 17 01/31/2020. All seem to have healed well but may have a few new spots.).    The following portions of the chart were reviewed this encounter and updated as appropriate:  Tobacco  Allergies  Meds  Problems  Med Hx  Surg Hx  Fam Hx      Review of Systems:  No other skin or systemic complaints except as noted in HPI or Assessment and Plan.  Objective  Well appearing patient in no apparent distress; mood and affect are within normal limits.  A focused examination was performed including Face, arms. Relevant physical exam findings are noted in the Assessment and Plan.  Objective  Face/ears x 7, neck x 9 (16): Erythematous thin papules/macules with gritty scale.   Objective  Neck - Anterior: Erythematous keratotic or waxy stuck-on papule or plaque.    Assessment & Plan    Actinic Damage - Severe - diffuse scaly erythematous macules with underlying dyspigmentation - Recommend daily broad spectrum sunscreen SPF 30+ to sun-exposed areas, reapply every 2 hours as needed.  - Call for new or changing lesions.   AK (actinic keratosis) (16) Face/ears x 7, neck x 9  Destruction of lesion - Face/ears x 7, neck x 9 Complexity: simple   Destruction method: cryotherapy   Informed consent: discussed and consent obtained   Timeout:  patient name, date of birth, surgical site, and procedure verified Lesion destroyed using liquid nitrogen: Yes   Region frozen until ice ball extended beyond lesion: Yes   Outcome: patient tolerated procedure well with no complications   Post-procedure details: wound care instructions given    Inflamed seborrheic keratosis Neck - Anterior  Destruction of lesion - Neck - Anterior Complexity: simple   Destruction method: cryotherapy   Informed consent: discussed and consent obtained   Timeout:   patient name, date of birth, surgical site, and procedure verified Lesion destroyed using liquid nitrogen: Yes   Region frozen until ice ball extended beyond lesion: Yes   Outcome: patient tolerated procedure well with no complications   Post-procedure details: wound care instructions given    Purpura - Violaceous macules and patches - Benign - Related to age, sun damage and/or use of blood thinners - Observe - Can use OTC arnica containing moisturizer such as Dermend Bruise Formula if desired - Call for worsening or other concerns   Return in about 5 months (around 09/02/2020).  I, Ashok Cordia, CMA, am acting as scribe for Sarina Ser, MD . Documentation: I have reviewed the above documentation for accuracy and completeness, and I agree with the above.  Sarina Ser, MD

## 2020-04-02 NOTE — Patient Instructions (Signed)

## 2020-04-04 ENCOUNTER — Encounter: Payer: Self-pay | Admitting: Dermatology

## 2020-04-08 ENCOUNTER — Other Ambulatory Visit: Payer: Self-pay | Admitting: Family Medicine

## 2020-04-08 MED ORDER — BUTALBITAL-APAP-CAFFEINE 50-325-40 MG PO TABS: ORAL_TABLET | ORAL | 2 refills | Status: DC

## 2020-04-08 NOTE — Telephone Encounter (Signed)
Requested medication (s) are due for refill today: yes  Requested medication (s) are on the active medication list: no  Last refill:  08/15/18  Future visit scheduled: yes  Notes to clinic:  not delegated; d/c'd 2.25.21 per Francene Finders, CPhT    Requested Prescriptions  Pending Prescriptions Disp Refills   BAC 50-325-40 MG tablet [Pharmacy Med Name: BUTALBITAL-APAP-CAFFEINE 50-325-40] 20 tablet     Sig: TAKE 1 OR 2 TABLETS BY MOUTH EVERY 6 HOURS AS NEEDED FOR HEADACHE      Not Delegated - Analgesics:  Non-Opioid Analgesic Combinations Failed - 04/08/2020 10:57 AM      Failed - This refill cannot be delegated      Passed - Valid encounter within last 12 months    Recent Outpatient Visits           2 months ago Clostridium difficile colitis   [facility] Birdie Sons, MD   3 months ago Essential (primary) hypertension   [facility] Birdie Sons, MD   8 months ago Bell's palsy   [facility] Birdie Sons, MD   9 months ago Pneumonia of left lower lobe due to infectious organism [facility])   [facility] Birdie Sons, MD   9 months ago Shortness of breath   [facility] Jerrol Banana., MD       Future Appointments             In 1 month McGowan, Gordan Payment [facility]   In 2 months Fisher, Kirstie Peri, MD [facility], [place]   In 5 months Ralene Bathe, MD [place]

## 2020-05-29 ENCOUNTER — Other Ambulatory Visit: Payer: Self-pay | Admitting: Family Medicine

## 2020-05-29 DIAGNOSIS — N200 Calculus of kidney: Secondary | ICD-10-CM

## 2020-06-03 NOTE — Progress Notes (Signed)
10:25 AM   DONTAVIS TSCHANTZ [birth year] [id number]  Referring provider: Birdie Sons, MD [address] [place],  [place] [address]  Chief Complaint  Patient presents with  . Follow-up    HPI: Patient is a 74 year old male with hematuria, nephrolithiasis and BPH with LU TS who presents today for follow up.   High risk hematuria Non-smoker.  CTU 04/2019 Stable 1.7 cm left adrenal nodule most compatible with adenoma given stability over time. Normal right adrenal gland. Noncontrast images demonstrate bilateral nephrolithiasis. Punctate stones demonstrated within the interpolar region and inferior pole of the left kidney. Stones throughout the right kidney measuring up to 5 mm within the superior pole and 4 mm within the inferior pole. No ureterolithiasis. No hydronephrosis.  There is symmetric renal enhancement. No suspicious enhancing renal masses are identified. Bilateral subcentimeter too small to characterize low-attenuation renal lesions are demonstrated.  Excreted contrast material within the bilateral renal collecting systems and ureters on delayed images demonstrates no abnormal filling defects within the opacified portions. Urinary bladder is unremarkable.  Central dystrophic calcifications in the prostate.  Urine cytology was negative.  Cysto with Dr. Erlene Quan 05/2019 Enlarged prostate bilobar coaptation, 4 cm prosthetic length.  UA 12/2019 negative for micro heme.  He denies any gross hematuira.   Nephrolithiasis Patient underwent left URS/LL/left ureteral stent placement with subsequent stent removal for a left distal (16mm) stone in 12/2015.  Follow up RUS confirmed resolution of the left hydronephrosis.  Bilateral nephrolithiasis is still present.  Stone composition was calcium oxalate dihydrate 30%, calcium oxalate monohydrate 60% and calcium phosphate 10%.  [facility] results noted he was at increased risk for stone formation due to borderline hyperoxaluria,  hypocitraturia, high urine pH and mild hyperurlcosuria.  He was given instruction on dietary changes and encouraged to drink 2.5 L of water daily.  KUB taken on 02/15/2017 noted no nephrolithiasis.   KUB taken on 02/06/2018 noted no evident renal or ureteral calculi by radiography. No bowel obstruction or free air evident.  KUB on 04/16/2019 noted several small calculi in the right kidney. These calculi may have been present previously but stool overlying the right colon on previous study obscures anatomic detail. There is much less stool currently overlying the right kidney.  No bowel obstruction.  No free air evident.  CTU 04/2019 Punctate stones demonstrated within the interpolar region and inferior pole of the left kidney. Stones throughout the right kidney measuring up to 5 mm within the superior pole and 4 mm within the inferior pole. No ureterolithiasis. No hydronephrosis.  KUB 01/2020 6 stones visible in the right kidney with largest 5 mm in size.  He has not had any passage of fragments, gross hematuria or flank pain.     LUTS  (prostate and/or bladder) IPSS score: 13/2    Previous score: 21/3   Previous PVR: 59 mL  Major complaint(s):  Frequency.     Currently taking: tamsulosin 0.4 mg daily and finasteride 5 mg daily.    Cysto 05/2019 Enlarged prostate bilobar coaptation, 4 cm prosthetic length.   Denies any recent fevers, chills, nausea or vomiting.    IPSS    Row Name 06/04/20 1000         International Prostate Symptom Score   How often have you had the sensation of not emptying your bladder? About half the time     How often have you had to urinate less than every two hours? About half the time  How often have you found you stopped and started again several times when you urinated? Less than half the time     How often have you found it difficult to postpone urination? Less than 1 in 5 times     How often have you had a weak urinary stream? Less than half the time     How  often have you had to strain to start urination? Less than 1 in 5 times     How many times did you typically get up at night to urinate? 1 Time     Total IPSS Score 13       Quality of Life due to urinary symptoms   If you were to spend the rest of your life with your urinary condition just the way it is now how would you feel about that? Mostly Satisfied            Score:  1-7 Mild 8-19 Moderate 20-35 Severe   PMH: Past Medical History:  Diagnosis Date  . Actinic keratosis   . Arthritis   . Basal cell carcinoma 11/09/2013   R lateral edge of clavicle  . Basal cell carcinoma 02/03/2016   L lateral supraclavicular base of neck/excision  . Basal cell carcinoma 11/29/2018   L upper back  . Cancer ([place])    Basal cell carcinoma bilateral arms and left shoulder  . Complication of anesthesia    slow to wake up with shoulder surgery  . Depression   . Enlarged prostate   . H/O elbow surgery    left  . Herpes simplex   . History of kidney stones   . History of MRSA infection 2004   skin around eye  . Hypertension   . Kidney stone   . Lumbar stenosis   . Migraine headache   . Motion sickness    ocean boats  . PONV (postoperative nausea and vomiting)   . Tendonitis of ankle, left     Surgical History: Past Surgical History:  Procedure Laterality Date  . APPENDECTOMY    . back injection    . BACK SURGERY    . CYSTOSCOPY/URETEROSCOPY/HOLMIUM LASER/STENT PLACEMENT Left 12/18/2015   Procedure: CYSTOSCOPY/URETEROSCOPY/HOLMIUM LASER LITHOTRIPSY;  Surgeon: Hollice Espy, MD;  Location: [facility];  Service: Urology;  Laterality: Left;  . ELBOW SURGERY    . LOWER BACK SURGERY  2010   L4-5 fusion  . LUMBAR LAMINECTOMY/DECOMPRESSION MICRODISCECTOMY N/A 08/21/2018   Procedure: LUMBAR LAMINECTOMY/DECOMPRESSION MICRODISCECTOMY 1 LEVEL-L3/4;  Surgeon: Deetta Perla, MD;  Location: [facility];  Service: Neurosurgery;  Laterality: N/A;  . NECK SURGERY     Upper neck surgery; with wires  placed  . SHOULDER SURGERY Right 11/10/2011   arthroscopic surgery . Dr. Tamala Julian, [facility]  . TONSILLECTOMY    . TRICEPS TENDON REPAIR Left 12/04/2019   Procedure: TRICEPS TENDON REPAIR, OLECRANON BURSA;  Surgeon: Leim Fabry, MD;  Location: [place];  Service: Orthopedics;  Laterality: Left;    Home Medications:  Allergies as of 06/04/2020      Reactions   Penicillins Other (See Comments)   Stomach Ache      Medication List       Accurate as of June 04, 2020 10:25 AM. If you have any questions, ask your nurse or doctor.        acetaminophen 500 MG tablet Commonly known as: TYLENOL Take 2 tablets (1,000 mg total) by mouth every 8 (eight) hours.   ALPRAZolam 0.5 MG tablet Commonly known as:  XANAX Take 0.5 mg by mouth at bedtime as needed for anxiety.   amLODipine 2.5 MG tablet Commonly known as: NORVASC TAKE 1 TABLET BY MOUTH ONCE DAILY IN THEEVENING What changed: See the new instructions.   aspirin 81 MG chewable tablet Chew by mouth daily.   atenolol-chlorthalidone 50-25 MG tablet Commonly known as: TENORETIC TAKE 1/2 TABLET BY MOUTH ONCE DAILY   butalbital-acetaminophen-caffeine 50-325-40 MG tablet Commonly known as: Bac TAKE 1 OR 2 TABLETS BY MOUTH EVERY 6 HOURS AS NEEDED FOR HEADACHE   diclofenac 50 MG EC tablet Commonly known as: VOLTAREN TAKE 1 TABLET BY MOUTH TWICE (2) DAILY   DIGESTIVE ADVANTAGE PO Take 1 tablet by mouth daily.   finasteride 5 MG tablet Commonly known as: PROSCAR Take 1 tablet (5 mg total) by mouth daily.   latanoprost 0.005 % ophthalmic solution Commonly known as: XALATAN Place 1 drop into both eyes at bedtime.   mirtazapine 30 MG tablet Commonly known as: REMERON Take 1 tablet (30 mg total) by mouth at bedtime.   multivitamin tablet Take 1 tablet by mouth daily.   ondansetron 4 MG disintegrating tablet Commonly known as: Zofran ODT Take 1 tablet (4 mg total) by mouth every 8 (eight) hours as needed for nausea or  vomiting.   psyllium 58.6 % powder Commonly known as: METAMUCIL Take 1 packet by mouth daily as needed (regularity).   tamsulosin 0.4 MG Caps capsule Commonly known as: FLOMAX Take 1 capsule (0.4 mg total) by mouth daily.   timolol 0.5 % ophthalmic solution Commonly known as: TIMOPTIC Place 1 drop into both eyes daily.       Allergies:  Allergies  Allergen Reactions  . Penicillins Other (See Comments)    Stomach Ache    Family History: Family History  Problem Relation Age of Onset  . Depression Son   . Diabetes Neg Hx   . Cancer Neg Hx   . Heart attack Neg Hx   . Kidney disease Neg Hx   . Prostate cancer Neg Hx   . Kidney cancer Neg Hx   . Bladder Cancer Neg Hx     Social History:  reports that he has never smoked. He has never used smokeless tobacco. He reports that he does not drink alcohol and does not use drugs.  ROS: For pertinent review of systems please refer to history of present illness  Physical Exam: BP (!) 149/81 (BP Location: Left Arm, Patient Position: Sitting, Cuff Size: Normal)   Pulse 54   Ht 6\' 3"  (1.905 m)   Wt (!) 206 lb 9.6 oz (93.7 kg)   BMI 25.82 kg/m   Constitutional:  Well nourished. Alert and oriented, No acute distress. HEENT: [place] AT, moist mucus membranes.  Trachea midline Cardiovascular: No clubbing, cyanosis, or edema. Respiratory: Normal respiratory effort, no increased work of breathing. GI: Abdomen is soft, non tender, non distended, no abdominal masses. Liver and spleen not palpable.  No hernias appreciated.  Stool sample for occult testing is not indicated.   GU: No CVA tenderness.  No bladder fullness or masses.  Patient with circumcised phallus.  Urethral meatus is patent.  No penile discharge. No penile lesions or rashes. Scrotum without lesions, cysts, rashes and/or edema.  Testicles are located scrotally bilaterally. No masses are appreciated in the testicles. Left and right epididymis are normal. Rectal: Patient with   normal sphincter tone. Anus and perineum without scarring or rashes. No rectal masses are appreciated. Prostate is approximately 40 grams, no nodules are  appreciated. Seminal vesicles are normal. Skin: No rashes, bruises or suspicious lesions. Lymph: No inguinal adenopathy. Neurologic: Grossly intact, no focal deficits, moving all 4 extremities. Psychiatric: Normal mood and affect.  Laboratory Data: Results for orders placed or performed in visit on 01/25/20  C difficile Toxins A+B W/Rflx   Specimen: Stool   ST     CD- [id number] MZ  Result Value Ref Range   C difficile Toxins A+B, EIA Negative Negative  Clostridium Difficile by PCR   Specimen: Stool   ST     CD- [id number] MZ  Result Value Ref Range   Toxigenic C. Difficile by PCR Positive (A) Negative  C difficile, Cytotoxin B   ST     CD- [id number] MZ  Result Value Ref Range   C diff Toxin B Final report    Result 1 Comment   Stool, WBC/Lactoferrin  Result Value Ref Range   Lactoferrin, Fecal, Quant. 619.64 (H) 0.00 - 7.24 ug/mL(g)    Lab Results  Component Value Date   WBC 16.2 (H) 01/14/2020   HGB 14.2 01/14/2020   HCT 42.2 01/14/2020   MCV 95 01/14/2020   PLT 507 (H) 01/14/2020    Lab Results  Component Value Date   CREATININE 0.95 01/14/2020    Lab Results  Component Value Date   PSA 0.1 11/28/2014   Component     Latest Ref Rng & Units 11/24/2015 12/20/2016 03/01/2017 12/21/2017  Prostate Specific Ag, Serum     0.0 - 4.0 ng/mL <0.1 <0.1 <0.1 <0.1   Component     Latest Ref Rng & Units 12/26/2018 04/16/2019  Prostate Specific Ag, Serum     0.0 - 4.0 ng/mL <0.1 <0.1    Lab Results  Component Value Date   TSH 1.02 02/27/2012       Component Value Date/Time   CHOL 170 12/26/2018 0735   HDL 51 12/26/2018 0735   CHOLHDL 3.3 12/26/2018 0735   LDLCALC 90 12/26/2018 0735    Lab Results  Component Value Date   AST 19 01/14/2020   Lab Results  Component Value Date   ALT 23 01/14/2020   I have reviewed the  labs.  Pertinent Imaging: CLINICAL DATA:  Lower abdominal pain and diarrhea for 5 days. C difficile colitis.  EXAM: ABDOMEN - 1 VIEW  COMPARISON:  01/05/2020  FINDINGS: There is persistent mucosal edema in the cecum and transverse portion of the colon. Overall diminished air in the bowel. No dilated small bowel. Stomach is not distended. Multiple right renal calculi noted. No acute bone abnormality.  IMPRESSION: Persistent mucosal edema in the cecum and transverse portion of the colon consistent with colitis.   Electronically Signed   By: Lorriane Shire M.D.   On: 01/07/2020 09:06 I have independently reviewed the films.  See HPI.   Assessment & Plan:    1. Gross hematuria Hematuria work up completed in 05/2019 - findings positive for enlarge prostate No report of gross hematuria  UA 12/2019 negative for micro heme RTC in one year for UA - patient to report any gross hematuria in the interim    2. LU TS I PSS is 13/2, it is improved Continue conservative management Continue tamsulosin 0.4 mg and finasteride 5 mg daily; refills given RTC in one year for I PSS, PSA and exam  3. Nephrolithiasis Recent KUB demonstrates right renal stones He does not desire any intervention at this time as he has recently recovered from C. Diff  RTC in one year for KUB  Return in about 1 year (around 06/04/2021) for KUB, UA, I PSS and exam .  These notes generated with voice recognition software. I apologize for typographical errors.  Zara Council, PA-C  [facility] [address] [place], [place] [address] [phone]

## 2020-06-04 ENCOUNTER — Ambulatory Visit (INDEPENDENT_AMBULATORY_CARE_PROVIDER_SITE_OTHER): Payer: Medicare Other | Admitting: Urology

## 2020-06-04 ENCOUNTER — Encounter: Payer: Self-pay | Admitting: Urology

## 2020-06-04 ENCOUNTER — Other Ambulatory Visit: Payer: Self-pay

## 2020-06-04 VITALS — BP 149/81 | HR 54 | Ht 75.0 in | Wt 206.6 lb

## 2020-06-04 DIAGNOSIS — N200 Calculus of kidney: Secondary | ICD-10-CM

## 2020-06-04 DIAGNOSIS — R399 Unspecified symptoms and signs involving the genitourinary system: Secondary | ICD-10-CM

## 2020-06-04 DIAGNOSIS — R310 Gross hematuria: Secondary | ICD-10-CM | POA: Diagnosis not present

## 2020-06-04 DIAGNOSIS — N401 Enlarged prostate with lower urinary tract symptoms: Secondary | ICD-10-CM | POA: Diagnosis not present

## 2020-06-04 DIAGNOSIS — N138 Other obstructive and reflux uropathy: Secondary | ICD-10-CM | POA: Diagnosis not present

## 2020-06-04 MED ORDER — FINASTERIDE 5 MG PO TABS: 5.0000 mg | ORAL_TABLET | Freq: Every day | ORAL | 3 refills | Status: DC

## 2020-06-06 ENCOUNTER — Other Ambulatory Visit: Payer: Self-pay | Admitting: Family Medicine

## 2020-06-06 DIAGNOSIS — F32A Depression, unspecified: Secondary | ICD-10-CM

## 2020-06-06 DIAGNOSIS — G4700 Insomnia, unspecified: Secondary | ICD-10-CM

## 2020-06-30 ENCOUNTER — Other Ambulatory Visit: Payer: Self-pay

## 2020-06-30 ENCOUNTER — Encounter: Payer: Self-pay | Admitting: Family Medicine

## 2020-06-30 ENCOUNTER — Ambulatory Visit (INDEPENDENT_AMBULATORY_CARE_PROVIDER_SITE_OTHER): Payer: Medicare Other | Admitting: Family Medicine

## 2020-06-30 VITALS — BP 168/91 | HR 48 | Temp 98.4°F | Ht 75.0 in | Wt 212.4 lb

## 2020-06-30 DIAGNOSIS — F329 Major depressive disorder, single episode, unspecified: Secondary | ICD-10-CM | POA: Diagnosis not present

## 2020-06-30 DIAGNOSIS — M1990 Unspecified osteoarthritis, unspecified site: Secondary | ICD-10-CM | POA: Diagnosis not present

## 2020-06-30 DIAGNOSIS — I10 Essential (primary) hypertension: Secondary | ICD-10-CM

## 2020-06-30 DIAGNOSIS — F32A Depression, unspecified: Secondary | ICD-10-CM

## 2020-06-30 MED ORDER — AMLODIPINE BESYLATE 5 MG PO TABS: 5.0000 mg | ORAL_TABLET | Freq: Every day | ORAL | 3 refills | Status: DC

## 2020-06-30 MED ORDER — DICLOFENAC SODIUM 1 % EX GEL: 4.0000 g | Freq: Four times a day (QID) | CUTANEOUS | 4 refills | Status: DC

## 2020-06-30 NOTE — Progress Notes (Signed)
Established patient visit   Patient: Brett Bender   DOB: [birth year]   74 y.o. Male  MRN: [id number] Visit Date: 06/30/2020  Today's healthcare provider: Lelon Huh, MD   Chief Complaint  Patient presents with  . Hypertension   [employer] as a scribe for Lelon Huh, MD.,have documented all relevant documentation on the behalf of Lelon Huh, MD,as directed by  Lelon Huh, MD while in the presence of Lelon Huh, MD.  Subjective    HPI  Hypertension, follow-up  BP Readings from Last 3 Encounters:  06/30/20 (!) 168/91  06/04/20 (!) 149/81  01/14/20 100/60   Wt Readings from Last 3 Encounters:  06/30/20 212 lb 6.4 oz (96.3 kg)  06/04/20 (!) 206 lb 9.6 oz (93.7 kg)  01/14/20 208 lb (94.3 kg)     He was last seen for hypertension 6 months ago.  BP at that visit was 100/60. Management since that visit includes patient advised to hold Amlodipine for 1 week due to low BP after recovering from c. diff.  He reports good compliance with treatment. He is not having side effects.  He is following a Regular diet. He is exercising. He does not smoke.  Use of agents associated with hypertension: none.   Outside blood pressures are not being checked at home. Symptoms: No chest pain No chest pressure  No palpitations No syncope  No dyspnea No orthopnea  No paroxysmal nocturnal dyspnea No lower extremity edema   Pertinent labs: Lab Results  Component Value Date   CHOL 170 12/26/2018   HDL 51 12/26/2018   LDLCALC 90 12/26/2018   TRIG 143 12/26/2018   CHOLHDL 3.3 12/26/2018   Lab Results  Component Value Date   NA 132 (L) 01/14/2020   K 4.4 01/14/2020   CREATININE 0.95 01/14/2020   GFRNONAA 79 01/14/2020   GFRAA 91 01/14/2020   GLUCOSE 101 (H) 01/14/2020     The 10-year ASCVD risk score Mikey Bussing DC Jr., et al., 2013) is: 35.5%   ---------------------------------------------------------------------------------------------------       Medications: Outpatient Medications Prior to Visit  Medication Sig  . acetaminophen (TYLENOL) 500 MG tablet Take 2 tablets (1,000 mg total) by mouth every 8 (eight) hours.  . ALPRAZolam (XANAX) 0.5 MG tablet Take 0.5 mg by mouth at bedtime as needed for anxiety.  Marland Kitchen amLODipine (NORVASC) 2.5 MG tablet TAKE 1 TABLET BY MOUTH ONCE DAILY IN THEEVENING (Patient taking differently: Take 2.5 mg by mouth every evening. )  . aspirin 81 MG chewable tablet Chew by mouth daily.  Marland Kitchen atenolol-chlorthalidone (TENORETIC) 50-25 MG tablet TAKE 1/2 TABLET BY MOUTH ONCE DAILY (Patient taking differently: Take 0.5 tablets by mouth daily. )  . butalbital-acetaminophen-caffeine (BAC) 50-325-40 MG tablet TAKE 1 OR 2 TABLETS BY MOUTH EVERY 6 HOURS AS NEEDED FOR HEADACHE  . diclofenac (VOLTAREN) 50 MG EC tablet TAKE 1 TABLET BY MOUTH TWICE (2) DAILY  . finasteride (PROSCAR) 5 MG tablet Take 1 tablet (5 mg total) by mouth daily.  Marland Kitchen latanoprost (XALATAN) 0.005 % ophthalmic solution Place 1 drop into both eyes at bedtime.   . mirtazapine (REMERON) 30 MG tablet TAKE 1 TABLET BY MOUTH EVERY NIGHT AT BEDTIME  . Multiple Vitamin (MULTIVITAMIN) tablet Take 1 tablet by mouth daily.  . ondansetron (ZOFRAN ODT) 4 MG disintegrating tablet Take 1 tablet (4 mg total) by mouth every 8 (eight) hours as needed for nausea or vomiting.  . Probiotic Product (DIGESTIVE ADVANTAGE PO) Take 1 tablet by mouth daily.  Marland Kitchen  psyllium (METAMUCIL) 58.6 % powder Take 1 packet by mouth daily as needed (regularity).   . tamsulosin (FLOMAX) 0.4 MG CAPS capsule Take 1 capsule (0.4 mg total) by mouth daily.  . timolol (TIMOPTIC) 0.5 % ophthalmic solution Place 1 drop into both eyes daily.    No facility-administered medications prior to visit.    Review of Systems  Constitutional: Negative.   Respiratory: Negative.   Cardiovascular: Negative.   Musculoskeletal: Negative.       Objective    BP (!) 168/91 (BP Location: Right Arm, Patient Position:  Sitting, Cuff Size: Large)   Pulse (!) 48   Temp 98.4 F (36.9 C) (Oral)   Ht 6\' 3"  (1.905 m)   Wt 212 lb 6.4 oz (96.3 kg)   BMI 26.55 kg/m    Physical Exam   General: Appearance:     Overweight male in no acute distress  Eyes:    PERRL, conjunctiva/corneas clear, EOM's intact       Lungs:     Clear to auscultation bilaterally, respirations unlabored  Heart:    Bradycardic. Normal rhythm. No murmurs, rubs, or gallops.   MS:   All extremities are intact.   Neurologic:   Awake, alert, oriented x 3. No apparent focal neurological           defect.          No results found for any visits on 06/30/20.  Assessment & Plan     1. Essential (primary) hypertension Tolerating current medication well, but not controlled. Increase from 2.5 to - amLODipine (NORVASC) 5 MG tablet; Take 1 tablet (5 mg total) by mouth daily.  Dispense: 90 tablet; Refill: 3  2. Depression Doing well current management by psychiatry.   3. Osteoarthritis, unspecified osteoarthritis type, unspecified site Not good  Candidate for oral NSAIDs, but has done well with Voltaren gel which he requests prescription for.  - diclofenac Sodium (VOLTAREN) 1 % GEL; Apply 4 g topically 4 (four) times daily.  Dispense: 100 g; Refill: 4   Follow up 6-8 weeks for BP       The entirety of the information documented in the History of Present Illness, Review of Systems and Physical Exam were personally obtained by me. Portions of this information were initially documented by the CMA and reviewed by me for thoroughness and accuracy.      Lelon Huh, MD  [facility] [phone] (phone) [phone] (fax)  [place]

## 2020-07-25 ENCOUNTER — Other Ambulatory Visit: Payer: Self-pay | Admitting: Family Medicine

## 2020-08-14 DIAGNOSIS — Z23 Encounter for immunization: Secondary | ICD-10-CM | POA: Diagnosis not present

## 2020-08-22 ENCOUNTER — Ambulatory Visit: Payer: Self-pay | Admitting: Family Medicine

## 2020-08-25 NOTE — Progress Notes (Signed)
Established patient visit   Patient: Brett Bender   DOB: 1946/02/19   74 y.o. Male  MRN: [id number] Visit Date: 08/26/2020  Today's healthcare provider: Lelon Huh, MD   Chief Complaint  Patient presents with  . Hypertension   Subjective    HPI  Hypertension, follow-up  BP Readings from Last 3 Encounters:  08/26/20 (!) 162/92  06/30/20 (!) 168/91  06/04/20 (!) 149/81   Wt Readings from Last 3 Encounters:  08/26/20 212 lb (96.2 kg)  06/30/20 212 lb 6.4 oz (96.3 kg)  06/04/20 (!) 206 lb 9.6 oz (93.7 kg)     He was last seen for hypertension 2 months ago.  BP at that visit was 168/91. Management since that visit includes increased Amlodipine from 2.5 mg to 5 mg.  He reports good compliance with treatment. He is not having side effects.  He is following a Regular diet. He is exercising. He does not smoke.  Use of agents associated with hypertension: NSAIDS.   Outside blood pressures are not checked. Symptoms: No chest pain No chest pressure  No palpitations No syncope  No dyspnea No orthopnea  No paroxysmal nocturnal dyspnea No lower extremity edema   Pertinent labs: Lab Results  Component Value Date   CHOL 170 12/26/2018   HDL 51 12/26/2018   LDLCALC 90 12/26/2018   TRIG 143 12/26/2018   CHOLHDL 3.3 12/26/2018   Lab Results  Component Value Date   NA 132 (L) 01/14/2020   K 4.4 01/14/2020   CREATININE 0.95 01/14/2020   GFRNONAA 79 01/14/2020   GFRAA 91 01/14/2020   GLUCOSE 101 (H) 01/14/2020     The 10-year ASCVD risk score Mikey Bussing DC Jr., et al., 2013) is: 33.7%   ---------------------------------------------------------------------------------------------------     Medications: Outpatient Medications Prior to Visit  Medication Sig  . acetaminophen (TYLENOL) 500 MG tablet Take 2 tablets (1,000 mg total) by mouth every 8 (eight) hours.  . ALPRAZolam (XANAX) 0.5 MG tablet Take 0.5 mg by mouth at bedtime as needed for anxiety.  Marland Kitchen  amLODipine (NORVASC) 5 MG tablet Take 1 tablet (5 mg total) by mouth daily.  Marland Kitchen aspirin 81 MG chewable tablet Chew by mouth daily.  Marland Kitchen atenolol-chlorthalidone (TENORETIC) 50-25 MG tablet TAKE 1/2 TABLET BY MOUTH ONCE DAILY  . butalbital-acetaminophen-caffeine (BAC) 50-325-40 MG tablet TAKE 1 OR 2 TABLETS BY MOUTH EVERY 6 HOURS AS NEEDED FOR HEADACHE  . diclofenac (VOLTAREN) 50 MG EC tablet TAKE 1 TABLET BY MOUTH TWICE (2) DAILY  . diclofenac Sodium (VOLTAREN) 1 % GEL Apply 4 g topically 4 (four) times daily.  . finasteride (PROSCAR) 5 MG tablet Take 1 tablet (5 mg total) by mouth daily.  Marland Kitchen latanoprost (XALATAN) 0.005 % ophthalmic solution Place 1 drop into both eyes at bedtime.   . mirtazapine (REMERON) 30 MG tablet TAKE 1 TABLET BY MOUTH EVERY NIGHT AT BEDTIME  . Multiple Vitamin (MULTIVITAMIN) tablet Take 1 tablet by mouth daily.  . ondansetron (ZOFRAN ODT) 4 MG disintegrating tablet Take 1 tablet (4 mg total) by mouth every 8 (eight) hours as needed for nausea or vomiting.  . Probiotic Product (DIGESTIVE ADVANTAGE PO) Take 1 tablet by mouth daily.  . psyllium (METAMUCIL) 58.6 % powder Take 1 packet by mouth daily as needed (regularity).   . tamsulosin (FLOMAX) 0.4 MG CAPS capsule Take 1 capsule (0.4 mg total) by mouth daily.  . timolol (TIMOPTIC) 0.5 % ophthalmic solution Place 1 drop into both eyes daily.  No facility-administered medications prior to visit.    Review of Systems  Constitutional: Negative for appetite change, chills and fever.  Respiratory: Negative for chest tightness, shortness of breath and wheezing.   Cardiovascular: Negative for chest pain and palpitations.  Gastrointestinal: Negative for abdominal pain, nausea and vomiting.     Objective    BP 132/82   Pulse (!) 50   Temp 97.8 F (36.6 C) (Oral)   Resp 16   Wt 212 lb (96.2 kg)   BMI 26.50 kg/m   Physical Exam  General appearance:  Overweight male, cooperative and in no acute distress Head:  Normocephalic, without obvious abnormality, atraumatic Respiratory: Respirations even and unlabored, normal respiratory rate Extremities: All extremities are intact.  Skin: Skin color, texture, turgor normal. No rashes seen  Psych: Appropriate mood and affect. Neurologic: Mental status: Alert, oriented to person, place, and time, thought content appropriate.     Assessment & Plan     1. Essential (primary) hypertension Better with increased dose of amlodipine. Continue current medications.  Follow up about 4 months, labs at follow up   2. Need for influenza vaccination  - Flu Vaccine QUAD High Dose IM (Fluad)   No follow-ups on file.      The entirety of the information documented in the History of Present Illness, Review of Systems and Physical Exam were personally obtained by me. Portions of this information were initially documented by the CMA and reviewed by me for thoroughness and accuracy.      Lelon Huh, MD  [facility] [phone] (phone) [phone] (fax)  [place]

## 2020-08-26 ENCOUNTER — Ambulatory Visit (INDEPENDENT_AMBULATORY_CARE_PROVIDER_SITE_OTHER): Payer: Medicare Other | Admitting: Family Medicine

## 2020-08-26 ENCOUNTER — Other Ambulatory Visit: Payer: Self-pay

## 2020-08-26 ENCOUNTER — Encounter: Payer: Self-pay | Admitting: Family Medicine

## 2020-08-26 VITALS — BP 132/82 | HR 50 | Temp 97.8°F | Resp 16 | Wt 212.0 lb

## 2020-08-26 DIAGNOSIS — I10 Essential (primary) hypertension: Secondary | ICD-10-CM | POA: Diagnosis not present

## 2020-08-26 DIAGNOSIS — Z23 Encounter for immunization: Secondary | ICD-10-CM

## 2020-08-31 IMAGING — CT CT ANGIOGRAPHY CHEST
1 of 14 series · 11 of 46 positions shown · IV contrast (APPLIED)
Comparison: None.

CLINICAL DATA: Shortness of breath, rule out PE

EXAM:
CT ANGIOGRAPHY CHEST WITH CONTRAST
TECHNIQUE: Multidetector CT imaging of the chest was performed using the
standard protocol during bolus administration of intravenous
contrast. Multiplanar CT image reconstructions and MIPs were
obtained to evaluate the vascular anatomy. Bolus injection was
repeated due to unsatisfactory initial contrast opacification.
CONTRAST:  75mL OMNIPAQUE IOHEXOL 350 MG/ML SOLN, 75mL OMNIPAQUE
IOHEXOL 350 MG/ML SOLN

[Series 14: thins · axial · 0.69mm/px · z∈[-341,-39]mm · 11 of 364 slices shown]
[im 31/364  lung]
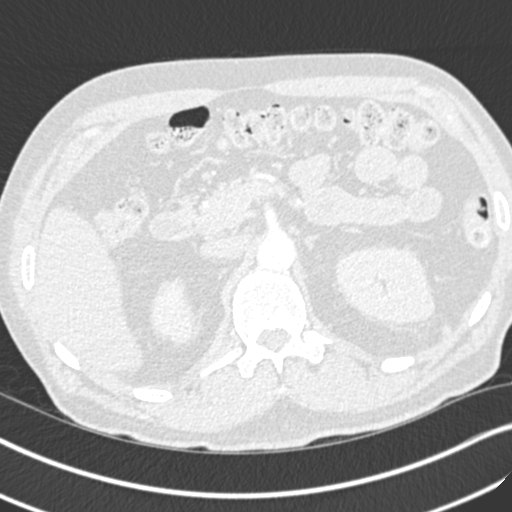
[im 61/364  soft-tissue]
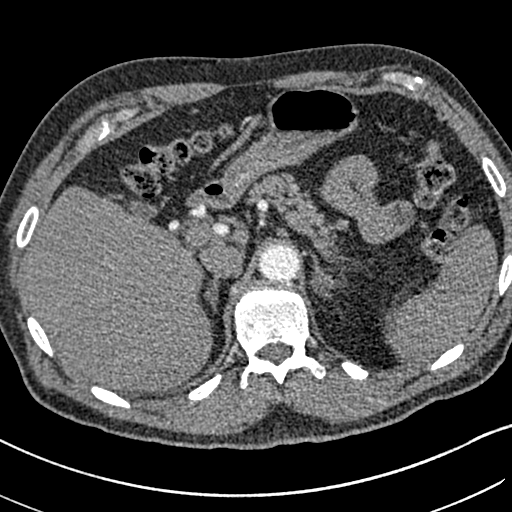
[im 91/364  lung]
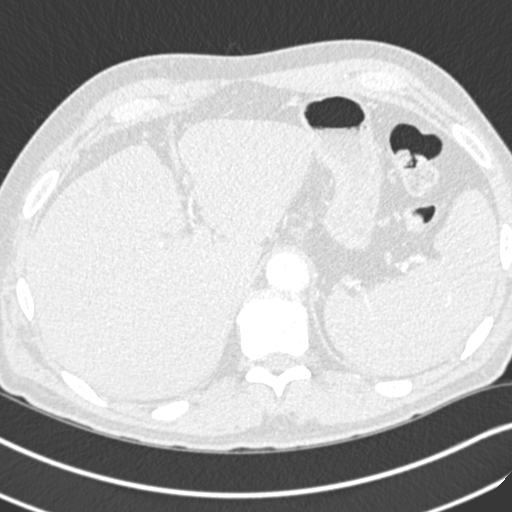
[im 122/364  soft-tissue]
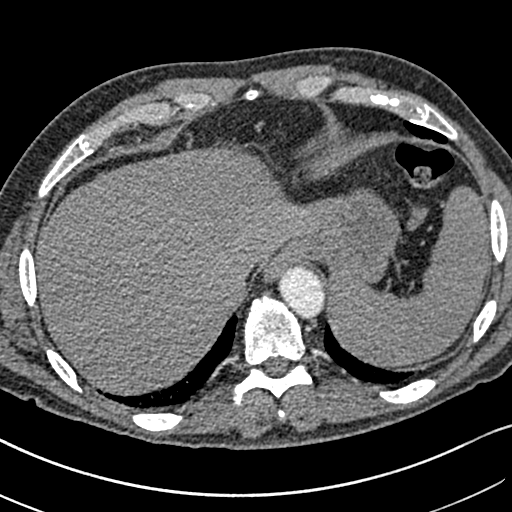
[im 152/364  lung]
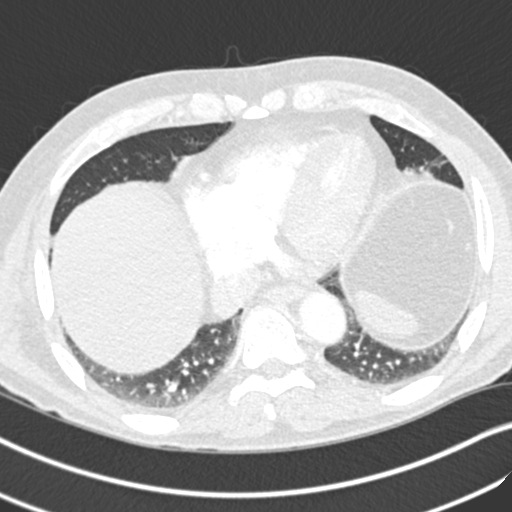
[im 182/364  soft-tissue]
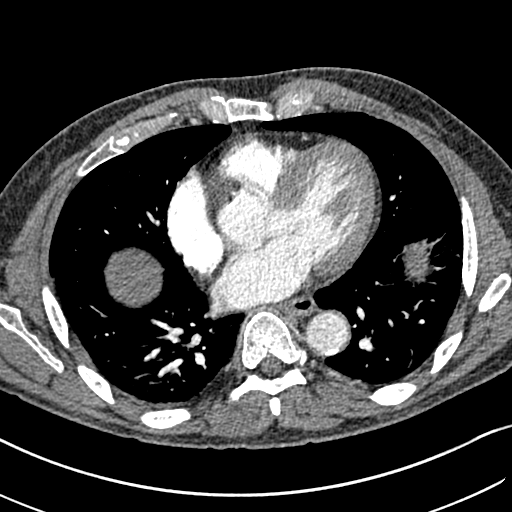
[im 212/364  lung]
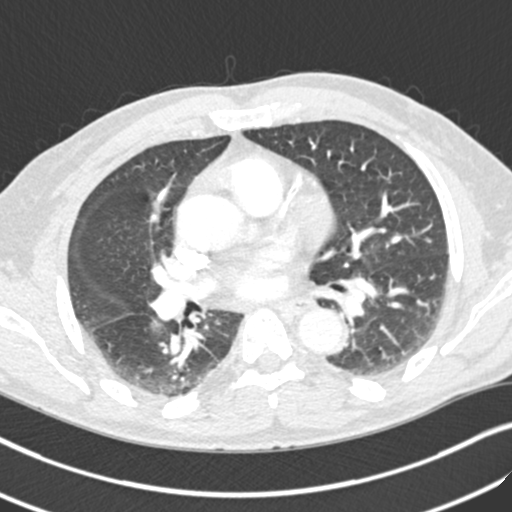
[im 243/364  soft-tissue]
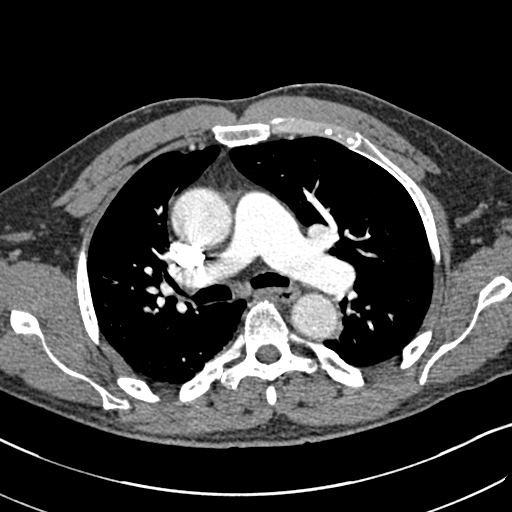
[im 273/364  lung]
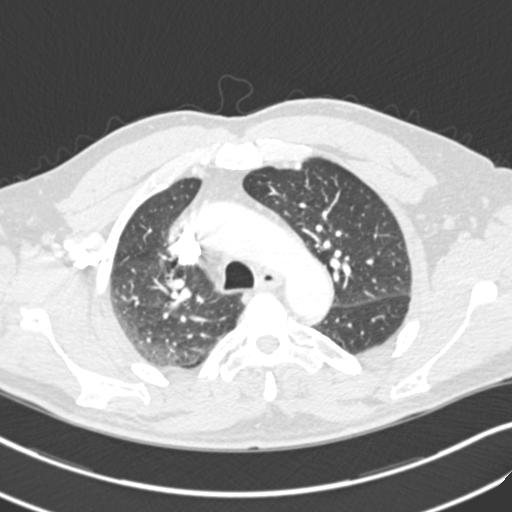
[im 303/364  soft-tissue]
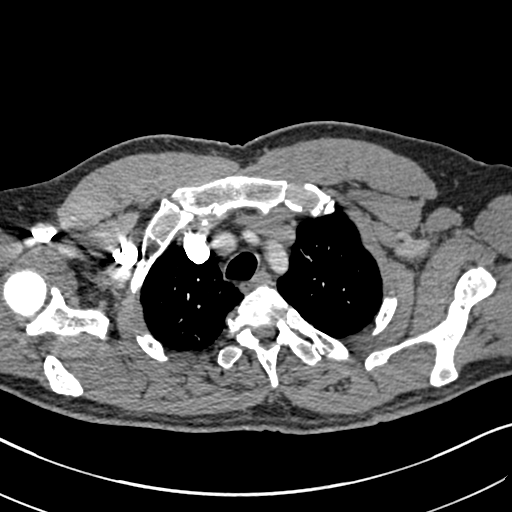
[im 333/364  lung]
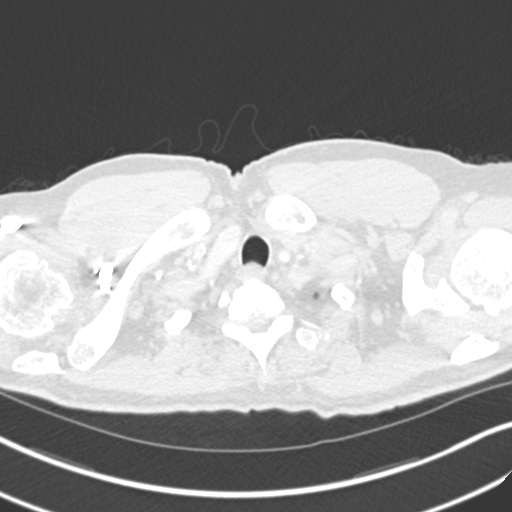

[11 of 46 positions shown; findings below may reference images not displayed]

FINDINGS: Cardiovascular: Satisfactory opacification of the pulmonary arteries
to the segmental level. No evidence of pulmonary embolism. Normal
heart size. Left coronary artery calcifications and/or stents. No
pericardial effusion. Minimal aortic atherosclerosis.

Mediastinum/Nodes: No enlarged mediastinal, hilar, or axillary lymph
nodes. Thyroid gland, trachea, and esophagus demonstrate no
significant findings.

Lungs/Pleura: Lungs are clear. No pleural effusion or pneumothorax.

Upper Abdomen: No acute abnormality. Splenomegaly, maximum span
cm.

Musculoskeletal: No chest wall abnormality. No acute or significant
osseous findings.

Review of the MIP images confirms the above findings.
IMPRESSION: 1.  Negative examination for pulmonary embolism.

2.  Coronary artery disease.

3.  Splenomegaly.

## 2020-09-10 ENCOUNTER — Other Ambulatory Visit: Payer: Self-pay

## 2020-09-10 ENCOUNTER — Encounter: Payer: Self-pay | Admitting: Dermatology

## 2020-09-10 ENCOUNTER — Ambulatory Visit (INDEPENDENT_AMBULATORY_CARE_PROVIDER_SITE_OTHER): Payer: Medicare Other | Admitting: Dermatology

## 2020-09-10 DIAGNOSIS — L821 Other seborrheic keratosis: Secondary | ICD-10-CM

## 2020-09-10 DIAGNOSIS — L570 Actinic keratosis: Secondary | ICD-10-CM

## 2020-09-10 DIAGNOSIS — L280 Lichen simplex chronicus: Secondary | ICD-10-CM | POA: Diagnosis not present

## 2020-09-10 DIAGNOSIS — C44519 Basal cell carcinoma of skin of other part of trunk: Secondary | ICD-10-CM

## 2020-09-10 DIAGNOSIS — L820 Inflamed seborrheic keratosis: Secondary | ICD-10-CM

## 2020-09-10 DIAGNOSIS — D485 Neoplasm of uncertain behavior of skin: Secondary | ICD-10-CM

## 2020-09-10 DIAGNOSIS — L578 Other skin changes due to chronic exposure to nonionizing radiation: Secondary | ICD-10-CM | POA: Diagnosis not present

## 2020-09-10 MED ORDER — MUPIROCIN 2 % EX OINT: 1.0000 "application " | TOPICAL_OINTMENT | Freq: Two times a day (BID) | CUTANEOUS | 3 refills | Status: DC

## 2020-09-10 NOTE — Patient Instructions (Signed)

## 2020-09-10 NOTE — Progress Notes (Signed)
Follow-Up Visit   Subjective  Brett Bender is a 74 y.o. male who presents for the following: Actinic Keratosis (follow up of face, ears and neck treated with LN2). 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 The patient presents for Upper Body Skin Exam (UBSE) for skin cancer screening and mole check.  The following portions of the chart were reviewed this encounter and updated as appropriate:  Tobacco  Allergies  Meds  Problems  Med Hx  Surg Hx  Fam Hx     Review of Systems:  No other skin or systemic complaints except as noted in HPI or Assessment and Plan.  Objective  Well appearing patient in no apparent distress; mood and affect are within normal limits.  All skin waist up examined.  Objective  Neck - Posterior: Ulcers/crusts/excoriations of left neck x 3 and left hand  Objective  Chest, face, ears (20): Erythematous thin papules/macules with gritty scale.   Objective  Trunk (5): Erythematous keratotic or waxy stuck-on papule or plaque.   Objective  Right mid back: 2.1 cm pink patch   Assessment & Plan    Actinic Damage - chronic, secondary to cumulative UV radiation exposure/sun exposure over time - diffuse scaly erythematous macules with underlying dyspigmentation - Recommend daily broad spectrum sunscreen SPF 30+ to sun-exposed areas, reapply every 2 hours as needed.  - Call for new or changing lesions.  Seborrheic Keratoses - Stuck-on, waxy, tan-brown papules and plaques  - Discussed benign etiology and prognosis. 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   Start mupirocin oint qd-bid Consider IL steroid injections in future.  mupirocin ointment (BACTROBAN) 2 % - Neck - Posterior  AK (actinic keratosis) (20) Chest, face, ears  Destruction of lesion - Chest, face,  ears Complexity: simple   Destruction method: cryotherapy   Informed consent: discussed and consent obtained   Timeout:  patient name, date of birth, surgical site, and procedure verified Lesion destroyed using liquid nitrogen: Yes   Region frozen until ice ball extended beyond lesion: Yes   Outcome: patient tolerated procedure well with no complications   Post-procedure details: wound care instructions given    Inflamed seborrheic keratosis (5) Trunk  Destruction of lesion - Trunk Complexity: simple   Destruction method: cryotherapy   Informed consent: discussed and consent obtained   Timeout:  patient name, date of birth, surgical site, and procedure verified Lesion destroyed using liquid nitrogen: Yes   Region frozen until ice ball extended beyond lesion: Yes   Outcome: patient tolerated procedure well with no complications   Post-procedure details: wound care instructions given    Neoplasm of uncertain behavior of skin Right mid back Epidermal / dermal shaving  Lesion diameter (cm):  2.1 Informed consent: discussed and consent obtained   Timeout: patient name, date of birth, surgical site, and procedure verified   Procedure prep:  Patient was prepped and draped in usual sterile fashion Prep type:  Isopropyl alcohol Anesthesia: the lesion was anesthetized in a standard fashion   Anesthetic:  1% lidocaine w/ epinephrine 1-100,000 buffered w/ 8.4% NaHCO3 Instrument used: flexible razor blade   Hemostasis achieved with: pressure, aluminum chloride and electrodesiccation   Outcome: patient tolerated procedure well   Post-procedure details: sterile dressing applied and wound care instructions given   Dressing type: bandage and petrolatum    Destruction  of lesion Complexity: extensive   Destruction method: electrodesiccation and curettage   Informed consent: discussed and consent obtained   Timeout:  patient name, date of birth, surgical site, and procedure  verified Procedure prep:  Patient was prepped and draped in usual sterile fashion Prep type:  Isopropyl alcohol Anesthesia: the lesion was anesthetized in a standard fashion   Anesthetic:  1% lidocaine w/ epinephrine 1-100,000 buffered w/ 8.4% NaHCO3 Curettage performed in three different directions: Yes   Electrodesiccation performed over the curetted area: Yes   Lesion length (cm):  2.1 Lesion width (cm):  2.1 Margin per side (cm):  0.2 Final wound size (cm):  2.5 Hemostasis achieved with:  pressure and aluminum chloride Outcome: patient tolerated procedure well with no complications   Post-procedure details: sterile dressing applied and wound care instructions given   Dressing type: bandage and petrolatum    Specimen 1 - Surgical pathology Differential Diagnosis: *BCC vs other  Check Margins: No 2.1 cm pink patch EDC today  Return in about 6 months (around 03/10/2021).  I, Ashok Cordia, CMA, am acting as scribe for Sarina Ser, MD .  Documentation: I have reviewed the above documentation for accuracy and completeness, and I agree with the above.  Sarina Ser, MD

## 2020-09-15 ENCOUNTER — Telehealth: Payer: Self-pay

## 2020-09-15 NOTE — Telephone Encounter (Signed)
Patient informed of pathology results 

## 2020-09-15 NOTE — Telephone Encounter (Signed)
-----   Message from Ralene Bathe, MD sent at 09/11/2020  5:46 PM EDT ----- Diagnosis Skin , right mid back BASAL CELL CARCINOMA, SUPERFICIAL AND NODULAR PATTERNS  Cancer - BCC Already treated Recheck next visit

## 2020-10-16 DIAGNOSIS — H2513 Age-related nuclear cataract, bilateral: Secondary | ICD-10-CM | POA: Diagnosis not present

## 2020-12-25 NOTE — Progress Notes (Signed)
Subjective:   Brett Bender is a 75 y.o. male who presents for Medicare Annual/Subsequent preventive examination.  I connected with Brett Bender today by telephone and verified that I am speaking with the correct person using two identifiers. Location patient: home Location provider: work Persons participating in the virtual visit: patient, provider.   I discussed the limitations, risks, security and privacy concerns of performing an evaluation and management service by telephone and the availability of in person appointments. I also discussed with the patient that there may be a patient responsible charge related to this service. The patient expressed understanding and verbally consented to this telephonic visit.    Interactive audio and video telecommunications were attempted between this provider and patient, however failed, due to patient having technical difficulties OR patient did not have access to video capability.  We continued and completed visit with audio only.   Review of Systems    N/A  Cardiac Risk Factors include: advanced age (>77men, >66 women);male gender;hypertension     Objective:    There were no vitals filed for this visit. There is no height or weight on file to calculate BMI.  Advanced Directives 12/29/2020 01/06/2020 01/03/2020 01/03/2020 12/27/2019 12/04/2019 07/20/2019  Does Patient Have a Medical Advance Directive? Yes Yes Yes Yes Yes Yes No  Type of Paramedic of [place];Living will Living will Living will Living will [place];Living will Living will -  Does patient want to make changes to medical advance directive? - No - Patient declined - - - No - Patient declined -  Copy of [place] in Chart? Yes - validated most recent copy scanned in chart (See row information) - Yes - validated most recent copy scanned in chart (See row information) - Yes - validated most recent copy scanned in chart (See  row information) - -  Would patient like information on creating a medical advance directive? - - No - Patient declined No - Patient declined - - -    Current Medications (verified) Outpatient Encounter Medications as of 12/29/2020  Medication Sig  . amLODipine (NORVASC) 5 MG tablet Take 1 tablet (5 mg total) by mouth daily.  Marland Kitchen aspirin 81 MG chewable tablet Chew by mouth daily.  Marland Kitchen atenolol-chlorthalidone (TENORETIC) 50-25 MG tablet TAKE 1/2 TABLET BY MOUTH ONCE DAILY  . butalbital-acetaminophen-caffeine (BAC) 50-325-40 MG tablet TAKE 1 OR 2 TABLETS BY MOUTH EVERY 6 HOURS AS NEEDED FOR HEADACHE  . diclofenac (VOLTAREN) 50 MG EC tablet TAKE 1 TABLET BY MOUTH TWICE (2) DAILY  . diclofenac Sodium (VOLTAREN) 1 % GEL Apply 4 g topically 4 (four) times daily.  . finasteride (PROSCAR) 5 MG tablet Take 1 tablet (5 mg total) by mouth daily.  Marland Kitchen latanoprost (XALATAN) 0.005 % ophthalmic solution Place 1 drop into both eyes at bedtime.   . mirtazapine (REMERON) 30 MG tablet TAKE 1 TABLET BY MOUTH EVERY NIGHT AT BEDTIME  . Multiple Vitamin (MULTIVITAMIN) tablet Take 1 tablet by mouth daily.  . mupirocin ointment (BACTROBAN) 2 % Apply 1 application topically 2 (two) times daily.  . ondansetron (ZOFRAN ODT) 4 MG disintegrating tablet Take 1 tablet (4 mg total) by mouth every 8 (eight) hours as needed for nausea or vomiting.  . Probiotic Product (DIGESTIVE ADVANTAGE PO) Take 1 tablet by mouth daily.  . psyllium (METAMUCIL) 58.6 % powder Take 1 packet by mouth daily as needed (regularity).   . tamsulosin (FLOMAX) 0.4 MG CAPS capsule Take 1 capsule (0.4  mg total) by mouth daily.  . timolol (TIMOPTIC) 0.5 % ophthalmic solution Place 1 drop into both eyes daily.   Marland Kitchen ALPRAZolam (XANAX) 0.5 MG tablet Take 0.5 mg by mouth at bedtime as needed for anxiety. (Patient not taking: Reported on 12/29/2020)   No facility-administered encounter medications on file as of 12/29/2020.    Allergies (verified) Penicillins    History: Past Medical History:  Diagnosis Date  . Actinic keratosis   . Arthritis   . Basal cell carcinoma 11/09/2013   R lateral edge of clavicle  . Basal cell carcinoma 02/03/2016   L lateral supraclavicular base of neck/excision  . Basal cell carcinoma 11/29/2018   L upper back  . Basal cell carcinoma 09/11/2020   R mid back   . Cancer ([place])    Basal cell carcinoma bilateral arms and left shoulder  . Complication of anesthesia    slow to wake up with shoulder surgery  . Depression   . Enlarged prostate   . H/O elbow surgery    left  . Herpes simplex   . History of kidney stones   . History of MRSA infection 2004   skin around eye  . Hypertension   . Kidney stone   . Lumbar stenosis   . Migraine headache   . Motion sickness    ocean boats  . PONV (postoperative nausea and vomiting)   . Tendonitis of ankle, left    Past Surgical History:  Procedure Laterality Date  . APPENDECTOMY    . back injection    . BACK SURGERY    . CYSTOSCOPY/URETEROSCOPY/HOLMIUM LASER/STENT PLACEMENT Left 12/18/2015   Procedure: CYSTOSCOPY/URETEROSCOPY/HOLMIUM LASER LITHOTRIPSY;  Surgeon: Hollice Espy, MD;  Location: [facility] ORS;  Service: Urology;  Laterality: Left;  . ELBOW SURGERY    . LOWER BACK SURGERY  2010   L4-5 fusion  . LUMBAR LAMINECTOMY/DECOMPRESSION MICRODISCECTOMY N/A 08/21/2018   Procedure: LUMBAR LAMINECTOMY/DECOMPRESSION MICRODISCECTOMY 1 LEVEL-L3/4;  Surgeon: Deetta Perla, MD;  Location: [facility] ORS;  Service: Neurosurgery;  Laterality: N/A;  . NECK SURGERY     Upper neck surgery; with wires placed  . SHOULDER SURGERY Right 11/10/2011   arthroscopic surgery . Dr. Tamala Julian, [facility]  . TONSILLECTOMY    . TRICEPS TENDON REPAIR Left 12/04/2019   Procedure: TRICEPS TENDON REPAIR, OLECRANON BURSA;  Surgeon: Leim Fabry, MD;  Location: [facility];  Service: Orthopedics;  Laterality: Left;   Family History  Problem Relation Age of Onset  . Depression Son   . Diabetes Neg Hx    . Cancer Neg Hx   . Heart attack Neg Hx   . Kidney disease Neg Hx   . Prostate cancer Neg Hx   . Kidney cancer Neg Hx   . Bladder Cancer Neg Hx    Social History   Socioeconomic History  . Marital status: Married    Spouse name: Not on file  . Number of children: 2  . Years of education: Not on file  . Highest education level: Associate degree: occupational, Hotel manager, or vocational program  Occupational History  . Occupation: Retired    Comment: former Investment banker, corporate  Tobacco Use  . Smoking status: Never Smoker  . Smokeless tobacco: Never Used  Vaping Use  . Vaping Use: Never used  Substance and Sexual Activity  . Alcohol use: No    Alcohol/week: 0.0 standard drinks  . Drug use: No  . Sexual activity: Yes  Other Topics Concern  . Not on file  Social History Narrative  .  Not on file   Social Determinants of Health   Financial Resource Strain: Low Risk   . Difficulty of Paying Living Expenses: Not hard at all  Food Insecurity: No Food Insecurity  . Worried About Charity fundraiser in the Last Year: Never true  . Ran Out of Food in the Last Year: Never true  Transportation Needs: No Transportation Needs  . Lack of Transportation (Medical): No  . Lack of Transportation (Non-Medical): No  Physical Activity: Sufficiently Active  . Days of Exercise per Week: 5 days  . Minutes of Exercise per Session: 30 min  Stress: No Stress Concern Present  . Feeling of Stress : Not at all  Social Connections: Moderately Integrated  . Frequency of Communication with Friends and Family: More than three times a week  . Frequency of Social Gatherings with Friends and Family: More than three times a week  . Attends Religious Services: More than 4 times per year  . Active Member of Clubs or Organizations: No  . Attends Archivist Meetings: Never  . Marital Status: Married    Tobacco Counseling Counseling given: Not Answered   Clinical Intake:  Pre-visit  preparation completed: Yes  Pain : No/denies pain     Nutritional Risks: None Diabetes: No  How often do you need to have someone help you when you read instructions, pamphlets, or other written materials from your doctor or pharmacy?: 1 - Never  Diabetic? No  Interpreter Needed?: No  Information entered by :: [facility], LPN   Activities of Daily Living In your present state of health, do you have any difficulty performing the following activities: 12/29/2020 01/06/2020  Hearing? N -  Vision? N -  Difficulty concentrating or making decisions? N -  Walking or climbing stairs? N -  Dressing or bathing? N -  Doing errands, shopping? N N  Preparing Food and eating ? N -  Using the Toilet? N -  In the past six months, have you accidently leaked urine? N -  Do you have problems with loss of bowel control? N -  Managing your Medications? N -  Managing your Finances? N -  Housekeeping or managing your Housekeeping? N -  Some recent data might be hidden    Patient Care Team: Birdie Sons, MD as PCP - General (Family Medicine) Laneta Simmers as Physician Assistant (Urology) Odette Fraction as Consulting Physician (Optometry) Ralene Bathe, MD (Dermatology)  Indicate any recent Medical Services you may have received from other than [facility] providers in the past year (date may be approximate).     Assessment:   This is a routine wellness examination for [place].  Hearing/Vision screen No exam data present  Dietary issues and exercise activities discussed: Current Exercise Habits: Structured exercise class;Home exercise routine, Type of exercise: walking;strength training/weights, Time (Minutes): 30, Frequency (Times/Week): 5, Weekly Exercise (Minutes/Week): 150, Intensity: Moderate, Exercise limited by: None identified  Goals    . Cut out extra servings     Recommend to cut out extra servings after dinner. Advised if going to have a snack to make it healthy,  no sweets.       Depression Screen PHQ 2/9 Scores 06/30/2020 01/01/2020 12/27/2019 12/25/2018 12/20/2017 12/17/2016 02/13/2016  PHQ - 2 Score 0 1 0 0 0 1 4  PHQ- 9 Score 0 1 - - - - 20    Fall Risk Fall Risk  12/29/2020 01/01/2020 12/27/2019 12/25/2018 12/20/2017  Falls in the past year?  0 0 0 0 No  Number falls in past yr: 0 0 0 - -  Injury with Fall? 0 0 0 - -    FALL RISK PREVENTION PERTAINING TO THE HOME:  Any stairs in or around the home? Yes  If so, are there any without handrails? No  Home free of loose throw rugs in walkways, pet beds, electrical cords, etc? Yes  Adequate lighting in your home to reduce risk of falls? Yes   ASSISTIVE DEVICES UTILIZED TO PREVENT FALLS:  Life alert? No  Use of a cane, walker or w/c? No  Grab bars in the bathroom? No  Shower chair or bench in shower? No  Elevated toilet seat or a handicapped toilet? Yes    Cognitive Function: Normal cognitive status assessed by observation by this Nurse Health Advisor. No abnormalities found.       6CIT Screen 12/17/2016  What Year? 0 points  What month? 0 points  What time? 0 points  Count back from 20 0 points  Months in reverse 0 points  Repeat phrase 0 points  Total Score 0    Immunizations Immunization History  Administered Date(s) Administered  . Fluad Quad(high Dose 65+) 08/03/2019, 08/26/2020  . Influenza Split 09/03/2006, 08/12/2007, 07/26/2008, 08/09/2012  . Influenza, High Dose Seasonal PF 08/10/2014, 08/18/2015, 08/10/2016, 07/28/2017, 08/16/2018  . Influenza,inj,Quad PF,6+ Mos 07/31/2013  . PFIZER(Purple Top)SARS-COV-2 Vaccination 12/26/2019, 01/16/2020, 08/14/2020  . Pneumococcal Conjugate-13 11/27/2014  . Pneumococcal Polysaccharide-23 12/22/2011  . Tdap 12/22/2011  . Zoster 11/26/2011  . Zoster Recombinat (Shingrix) 05/25/2017, 07/28/2017    TDAP status: Up to date  Flu Vaccine status: Up to date  Pneumococcal vaccine status: Up to date  Covid-19 vaccine status: Completed  vaccines  Qualifies for Shingles Vaccine? Yes   Zostavax completed Yes   Shingrix Completed?: Yes  Screening Tests Health Maintenance  Topic Date Due  . COVID-19 Vaccine (4 - Booster for Pfizer series) 02/12/2021  . TETANUS/TDAP  12/21/2021  . COLONOSCOPY (Pts 45-69yrs Insurance coverage will need to be confirmed)  11/13/2022  . INFLUENZA VACCINE  Completed  . Hepatitis C Screening  Completed  . PNA vac Low Risk Adult  Completed    Health Maintenance  There are no preventive care reminders to display for this patient.  Colorectal cancer screening: Type of screening: Colonoscopy. Completed 11/13/12. Repeat every 10 years  Lung Cancer Screening: (Low Dose CT Chest recommended if Age 62-80 years, 30 pack-year currently smoking OR have quit w/in 15years.) does not qualify.    Additional Screening:  Hepatitis C Screening: Up to date  Vision Screening: Recommended annual ophthalmology exams for early detection of glaucoma and other disorders of the eye. Is the patient up to date with their annual eye exam?  Yes  Who is the provider or what is the name of the office in which the patient attends annual eye exams? [facility]. If pt is not established with a provider, would they like to be referred to a provider to establish care? No .   Dental Screening: Recommended annual dental exams for proper oral hygiene  Community Resource Referral / Chronic Care Management: CRR required this visit?  No   CCM required this visit?  No      Plan:     I have personally reviewed and noted the following in the patient's chart:   . Medical and social history . Use of alcohol, tobacco or illicit drugs  . Current medications and supplements . Functional ability and status .  Nutritional status . Physical activity . Advanced directives . List of other physicians . Hospitalizations, surgeries, and ER visits in previous 12 months . Vitals . Screenings to include cognitive,  depression, and falls . Referrals and appointments  In addition, I have reviewed and discussed with patient certain preventive protocols, quality metrics, and best practice recommendations. A written personalized care plan for preventive services as well as general preventive health recommendations were provided to patient.     Montee Tallman [place], [place]   6/[birth year]/2552   Nurse Notes: None.

## 2020-12-29 ENCOUNTER — Ambulatory Visit (INDEPENDENT_AMBULATORY_CARE_PROVIDER_SITE_OTHER): Payer: Medicare Other

## 2020-12-29 ENCOUNTER — Other Ambulatory Visit: Payer: Self-pay

## 2020-12-29 DIAGNOSIS — Z0000 Encounter for general adult medical examination without abnormal findings: Secondary | ICD-10-CM | POA: Diagnosis not present

## 2020-12-29 NOTE — Patient Instructions (Signed)
[name] , Thank you for taking time to come for your Medicare Wellness Visit. I appreciate your ongoing commitment to your health goals. Please review the following plan we discussed and let me know if I can assist you in the future.   Screening recommendations/referrals: Colonoscopy: Up to date, due 11/2022 Recommended yearly ophthalmology/optometry visit for glaucoma screening and checkup Recommended yearly dental visit for hygiene and checkup  Vaccinations: Influenza vaccine: Done 08/26/20 Pneumococcal vaccine: Completed series Tdap vaccine: Up to date, due 12/2021 Shingles vaccine: Completed series    Advanced directives: Currently on file.  Conditions/risks identified: Recommend to cut out extra servings after dinner. Advised if going to have a snack to make it healthy, no sweets.   Next appointment: 12/30/20 @ 10:20 AM with Dr Caryn Section   Preventive Care 75 Years and Older, Male Preventive care refers to lifestyle choices and visits with your health care provider that can promote health and wellness. What does preventive care include?  A yearly physical exam. This is also called an annual well check.  Dental exams once or twice a year.  Routine eye exams. Ask your health care provider how often you should have your eyes checked.  Personal lifestyle choices, including:  Daily care of your teeth and gums.  Regular physical activity.  Eating a healthy diet.  Avoiding tobacco and drug use.  Limiting alcohol use.  Practicing safe sex.  Taking low doses of aspirin every day.  Taking vitamin and mineral supplements as recommended by your health care provider. What happens during an annual well check? The services and screenings done by your health care provider during your annual well check will depend on your age, overall health, lifestyle risk factors, and family history of disease. Counseling  Your health care provider may ask you questions about your:  Alcohol  use.  Tobacco use.  Drug use.  Emotional well-being.  Home and relationship well-being.  Sexual activity.  Eating habits.  History of falls.  Memory and ability to understand (cognition).  Work and work Statistician. Screening  You may have the following tests or measurements:  Height, weight, and BMI.  Blood pressure.  Lipid and cholesterol levels. These may be checked every 5 years, or more frequently if you are over 46 years old.  Skin check.  Lung cancer screening. You may have this screening every year starting at age 21 if you have a 30-pack-year history of smoking and currently smoke or have quit within the past 15 years.  Fecal occult blood test (FOBT) of the stool. You may have this test every year starting at age 64.  Flexible sigmoidoscopy or colonoscopy. You may have a sigmoidoscopy every 5 years or a colonoscopy every 10 years starting at age 24.  Prostate cancer screening. Recommendations will vary depending on your family history and other risks.  Hepatitis C blood test.  Hepatitis B blood test.  Sexually transmitted disease (STD) testing.  Diabetes screening. This is done by checking your blood sugar (glucose) after you have not eaten for a while (fasting). You may have this done every 1-3 years.  Abdominal aortic aneurysm (AAA) screening. You may need this if you are a current or former smoker.  Osteoporosis. You may be screened starting at age 68 if you are at high risk. Talk with your health care provider about your test results, treatment options, and if necessary, the need for more tests. Vaccines  Your health care provider may recommend certain vaccines, such as:  Influenza vaccine.  This is recommended every year.  Tetanus, diphtheria, and acellular pertussis (Tdap, Td) vaccine. You may need a Td booster every 10 years.  Zoster vaccine. You may need this after age 50.  Pneumococcal 13-valent conjugate (PCV13) vaccine. One dose is  recommended after age 79.  Pneumococcal polysaccharide (PPSV23) vaccine. One dose is recommended after age 75. Talk to your health care provider about which screenings and vaccines you need and how often you need them. This information is not intended to replace advice given to you by your health care provider. Make sure you discuss any questions you have with your health care provider. Document Released: 11/21/2015 Document Revised: 07/14/2016 Document Reviewed: 08/26/2015 Elsevier Interactive Patient Education  2017 [employer] Prevention in the Home Falls can cause injuries. They can happen to people of all ages. There are many things you can do to make your home safe and to help prevent falls. What can I do on the outside of my home?  Regularly fix the edges of walkways and driveways and fix any cracks.  Remove anything that might make you trip as you walk through a door, such as a raised step or threshold.  Trim any bushes or trees on the path to your home.  Use bright outdoor lighting.  Clear any walking paths of anything that might make someone trip, such as rocks or tools.  Regularly check to see if handrails are loose or broken. Make sure that both sides of any steps have handrails.  Any raised decks and porches should have guardrails on the edges.  Have any leaves, snow, or ice cleared regularly.  Use sand or salt on walking paths during winter.  Clean up any spills in your garage right away. This includes oil or grease spills. What can I do in the bathroom?  Use night lights.  Install grab bars by the toilet and in the tub and shower. Do not use towel bars as grab bars.  Use non-skid mats or decals in the tub or shower.  If you need to sit down in the shower, use a plastic, non-slip stool.  Keep the floor dry. Clean up any water that spills on the floor as soon as it happens.  Remove soap buildup in the tub or shower regularly.  Attach bath mats  securely with double-sided non-slip rug tape.  Do not have throw rugs and other things on the floor that can make you trip. What can I do in the bedroom?  Use night lights.  Make sure that you have a light by your bed that is easy to reach.  Do not use any sheets or blankets that are too big for your bed. They should not hang down onto the floor.  Have a firm chair that has side arms. You can use this for support while you get dressed.  Do not have throw rugs and other things on the floor that can make you trip. What can I do in the kitchen?  Clean up any spills right away.  Avoid walking on wet floors.  Keep items that you use a lot in easy-to-reach places.  If you need to reach something above you, use a strong step stool that has a grab bar.  Keep electrical cords out of the way.  Do not use floor polish or wax that makes floors slippery. If you must use wax, use non-skid floor wax.  Do not have throw rugs and other things on the floor that can make  you trip. What can I do with my stairs?  Do not leave any items on the stairs.  Make sure that there are handrails on both sides of the stairs and use them. Fix handrails that are broken or loose. Make sure that handrails are as long as the stairways.  Check any carpeting to make sure that it is firmly attached to the stairs. Fix any carpet that is loose or worn.  Avoid having throw rugs at the top or bottom of the stairs. If you do have throw rugs, attach them to the floor with carpet tape.  Make sure that you have a light switch at the top of the stairs and the bottom of the stairs. If you do not have them, ask someone to add them for you. What else can I do to help prevent falls?  Wear shoes that:  Do not have high heels.  Have rubber bottoms.  Are comfortable and fit you well.  Are closed at the toe. Do not wear sandals.  If you use a stepladder:  Make sure that it is fully opened. Do not climb a closed  stepladder.  Make sure that both sides of the stepladder are locked into place.  Ask someone to hold it for you, if possible.  Clearly mark and make sure that you can see:  Any grab bars or handrails.  First and last steps.  Where the edge of each step is.  Use tools that help you move around (mobility aids) if they are needed. These include:  Canes.  Walkers.  Scooters.  Crutches.  Turn on the lights when you go into a dark area. Replace any light bulbs as soon as they burn out.  Set up your furniture so you have a clear path. Avoid moving your furniture around.  If any of your floors are uneven, fix them.  If there are any pets around you, be aware of where they are.  Review your medicines with your doctor. Some medicines can make you feel dizzy. This can increase your chance of falling. Ask your doctor what other things that you can do to help prevent falls. This information is not intended to replace advice given to you by your health care provider. Make sure you discuss any questions you have with your health care provider. Document Released: 08/21/2009 Document Revised: 04/01/2016 Document Reviewed: 11/29/2014 Elsevier Interactive Patient Education  2017 [employer].

## 2020-12-30 ENCOUNTER — Encounter: Payer: Self-pay | Admitting: Family Medicine

## 2020-12-30 ENCOUNTER — Ambulatory Visit (INDEPENDENT_AMBULATORY_CARE_PROVIDER_SITE_OTHER): Payer: Medicare Other | Admitting: Family Medicine

## 2020-12-30 ENCOUNTER — Ambulatory Visit: Payer: Self-pay | Admitting: Family Medicine

## 2020-12-30 ENCOUNTER — Other Ambulatory Visit: Payer: Self-pay

## 2020-12-30 VITALS — BP 138/88 | HR 53 | Temp 98.6°F | Resp 16 | Ht 75.0 in | Wt 217.0 lb

## 2020-12-30 DIAGNOSIS — Z125 Encounter for screening for malignant neoplasm of prostate: Secondary | ICD-10-CM | POA: Diagnosis not present

## 2020-12-30 DIAGNOSIS — I10 Essential (primary) hypertension: Secondary | ICD-10-CM

## 2020-12-30 MED ORDER — AMLODIPINE BESYLATE 10 MG PO TABS: 10.0000 mg | ORAL_TABLET | Freq: Every day | ORAL | 1 refills | Status: DC

## 2020-12-30 NOTE — Progress Notes (Signed)
Established patient visit   Patient: Brett Bender   DOB: [birth year]   75 y.o. Male  MRN: [id number] Visit Date: 12/30/2020  Today's healthcare provider: Lelon Huh, MD   Chief Complaint  Patient presents with  . Hypertension   Patient had AWE on 12/29/2020.   Subjective    HPI  Hypertension, follow-up  BP Readings from Last 3 Encounters:  12/30/20 (!) 157/85  08/26/20 132/82  06/30/20 (!) 168/91   Wt Readings from Last 3 Encounters:  12/30/20 217 lb (98.4 kg)  08/26/20 212 lb (96.2 kg)  06/30/20 212 lb 6.4 oz (96.3 kg)     He was last seen for hypertension 4 months ago.  BP at that visit was 132/82. Management since that visit includes continue current medications.  He reports good compliance with treatment. He is not having side effects.  He is following a Regular diet. He is not exercising. He does not smoke.  Use of agents associated with hypertension: none.   Outside blood pressures are not being checked. Symptoms: No chest pain No chest pressure  No palpitations No syncope  No dyspnea No orthopnea  No paroxysmal nocturnal dyspnea No lower extremity edema   Pertinent labs: Lab Results  Component Value Date   CHOL 170 12/26/2018   HDL 51 12/26/2018   LDLCALC 90 12/26/2018   TRIG 143 12/26/2018   CHOLHDL 3.3 12/26/2018   Lab Results  Component Value Date   NA 132 (L) 01/14/2020   K 4.4 01/14/2020   CREATININE 0.95 01/14/2020   GFRNONAA 79 01/14/2020   GFRAA 91 01/14/2020   GLUCOSE 101 (H) 01/14/2020     The 10-year ASCVD risk score Mikey Bussing DC Jr., et al., 2013) is: 34.2%       Medications: Outpatient Medications Prior to Visit  Medication Sig  . amLODipine (NORVASC) 5 MG tablet Take 1 tablet (5 mg total) by mouth daily.  Marland Kitchen aspirin 81 MG chewable tablet Chew by mouth daily.  Marland Kitchen atenolol-chlorthalidone (TENORETIC) 50-25 MG tablet TAKE 1/2 TABLET BY MOUTH ONCE DAILY  . butalbital-acetaminophen-caffeine (BAC) 50-325-40 MG tablet TAKE  1 OR 2 TABLETS BY MOUTH EVERY 6 HOURS AS NEEDED FOR HEADACHE  . diclofenac (VOLTAREN) 50 MG EC tablet TAKE 1 TABLET BY MOUTH TWICE (2) DAILY  . diclofenac Sodium (VOLTAREN) 1 % GEL Apply 4 g topically 4 (four) times daily.  . finasteride (PROSCAR) 5 MG tablet Take 1 tablet (5 mg total) by mouth daily.  Marland Kitchen latanoprost (XALATAN) 0.005 % ophthalmic solution Place 1 drop into both eyes at bedtime.   . mirtazapine (REMERON) 30 MG tablet TAKE 1 TABLET BY MOUTH EVERY NIGHT AT BEDTIME  . Multiple Vitamin (MULTIVITAMIN) tablet Take 1 tablet by mouth daily.  . mupirocin ointment (BACTROBAN) 2 % Apply 1 application topically 2 (two) times daily.  . ondansetron (ZOFRAN ODT) 4 MG disintegrating tablet Take 1 tablet (4 mg total) by mouth every 8 (eight) hours as needed for nausea or vomiting.  . Probiotic Product (DIGESTIVE ADVANTAGE PO) Take 1 tablet by mouth daily.  . psyllium (METAMUCIL) 58.6 % powder Take 1 packet by mouth daily as needed (regularity).   . tamsulosin (FLOMAX) 0.4 MG CAPS capsule Take 1 capsule (0.4 mg total) by mouth daily.  . timolol (TIMOPTIC) 0.5 % ophthalmic solution Place 1 drop into both eyes daily.   Marland Kitchen ALPRAZolam (XANAX) 0.5 MG tablet Take 0.5 mg by mouth at bedtime as needed for anxiety. (Patient not taking: No sig reported)  No facility-administered medications prior to visit.    Review of Systems  Constitutional: Negative.   Respiratory: Negative.   Cardiovascular: Negative.   Musculoskeletal: Negative.   Neurological: Negative.        Objective    BP 138/88   Pulse (!) 53   Temp 98.6 F (37 C)   Resp 16   Ht 6\' 3"  (1.905 m)   Wt 217 lb (98.4 kg)   BMI 27.12 kg/m     Physical Exam   General: Appearance:     Overweight male in no acute distress  Eyes:    PERRL, conjunctiva/corneas clear, EOM's intact       Lungs:     Clear to auscultation bilaterally, respirations unlabored  Heart:    Bradycardic. Normal rhythm. No murmurs, rubs, or gallops.   MS:   All  extremities are intact.   Neurologic:   Awake, alert, oriented x 3. No apparent focal neurological           defect.           Assessment & Plan     1. Essential (primary) hypertension SBP have been consistently elevated or borderline high, will increase amlodipine to 10mg  and recheck in 3-4 months.  - CBC with Differential/Platelet - Comprehensive metabolic panel - amLODipine (NORVASC) 10 MG tablet; Take 1 tablet (10 mg total) by mouth daily.  Dispense: 90 tablet; Refill: 1 - Lipid panel  2. Prostate cancer screening  - PSA Total (Reflex To Free) ([facility] only)   No follow-ups on file.      The entirety of the information documented in the History of Present Illness, Review of Systems and Physical Exam were personally obtained by me. Portions of this information were initially documented by the CMA and reviewed by me for thoroughness and accuracy.      Lelon Huh, MD  [facility] [phone] (phone) [phone] (fax)  [place]

## 2020-12-30 NOTE — Patient Instructions (Addendum)
.   Please review the attached list of medications and notify my office if there are any errors.   . Start taking OTC metamucil powder every evening.

## 2020-12-31 DIAGNOSIS — I10 Essential (primary) hypertension: Secondary | ICD-10-CM | POA: Diagnosis not present

## 2020-12-31 DIAGNOSIS — Z125 Encounter for screening for malignant neoplasm of prostate: Secondary | ICD-10-CM | POA: Diagnosis not present

## 2021-01-01 LAB — CBC WITH DIFFERENTIAL/PLATELET
Basophils Absolute: 0.1 10*3/uL (ref 0.0–0.2)
Basos: 1 %
EOS (ABSOLUTE): 0.2 10*3/uL (ref 0.0–0.4)
Eos: 2 %
Hematocrit: 50.1 % (ref 37.5–51.0)
Hemoglobin: 17.1 g/dL (ref 13.0–17.7)
Immature Grans (Abs): 0 10*3/uL (ref 0.0–0.1)
Immature Granulocytes: 0 %
Lymphocytes Absolute: 4.3 10*3/uL — ABNORMAL HIGH (ref 0.7–3.1)
Lymphs: 49 %
MCH: 31.8 pg (ref 26.6–33.0)
MCHC: 34.1 g/dL (ref 31.5–35.7)
MCV: 93 fL (ref 79–97)
Monocytes Absolute: 0.7 10*3/uL (ref 0.1–0.9)
Monocytes: 8 %
Neutrophils Absolute: 3.6 10*3/uL (ref 1.4–7.0)
Neutrophils: 40 %
Platelets: 250 10*3/uL (ref 150–450)
RBC: 5.37 x10E6/uL (ref 4.14–5.80)
RDW: 12.8 % (ref 11.6–15.4)
WBC: 8.9 10*3/uL (ref 3.4–10.8)

## 2021-01-01 LAB — PSA TOTAL (REFLEX TO FREE): Prostate Specific Ag, Serum: 0.1 ng/mL (ref 0.0–4.0)

## 2021-01-01 LAB — COMPREHENSIVE METABOLIC PANEL
ALT: 20 IU/L (ref 0–44)
AST: 22 IU/L (ref 0–40)
Albumin/Globulin Ratio: 1.6 (ref 1.2–2.2)
Albumin: 4.5 g/dL (ref 3.7–4.7)
Alkaline Phosphatase: 74 IU/L (ref 44–121)
BUN/Creatinine Ratio: 17 (ref 10–24)
BUN: 21 mg/dL (ref 8–27)
Bilirubin Total: 0.7 mg/dL (ref 0.0–1.2)
CO2: 23 mmol/L (ref 20–29)
Calcium: 10 mg/dL (ref 8.6–10.2)
Chloride: 99 mmol/L (ref 96–106)
Creatinine, Ser: 1.22 mg/dL (ref 0.76–1.27)
GFR calc Af Amer: 67 mL/min/{1.73_m2} (ref >59)
GFR calc non Af Amer: 58 mL/min/{1.73_m2} — ABNORMAL LOW (ref >59)
Globulin, Total: 2.9 g/dL (ref 1.5–4.5)
Glucose: 107 mg/dL — ABNORMAL HIGH (ref 65–99)
Potassium: 4.9 mmol/L (ref 3.5–5.2)
Sodium: 137 mmol/L (ref 134–144)
Total Protein: 7.4 g/dL (ref 6.0–8.5)

## 2021-01-01 LAB — LIPID PANEL
Chol/HDL Ratio: 3.9 ratio (ref 0.0–5.0)
Cholesterol, Total: 173 mg/dL (ref 100–199)
HDL: 44 mg/dL (ref >39)
LDL Chol Calc (NIH): 107 mg/dL — ABNORMAL HIGH (ref 0–99)
Triglycerides: 123 mg/dL (ref 0–149)
VLDL Cholesterol Cal: 22 mg/dL (ref 5–40)

## 2021-01-02 ENCOUNTER — Ambulatory Visit: Payer: Self-pay | Admitting: *Deleted

## 2021-01-02 NOTE — Telephone Encounter (Signed)
  Reason for Disposition . [1] Caller requesting NON-URGENT health information AND [2] PCP's office is the best resource  Protocols used: INFORMATION ONLY CALL - NO TRIAGE-A-AH

## 2021-01-02 NOTE — Telephone Encounter (Signed)
Patient advised of lab results and verbalized understanding.  

## 2021-01-02 NOTE — Telephone Encounter (Addendum)
Per initial encounter, "Pt wanted to discuss his lab results with a nurse since he did not fully understand. PT stated he saw the results on MyChart and just wanted to further discuss"; attempted to contact pt on cell phone; left messages x 3; attempted to contact pt on home phone; message states number not in service; will route to office for discussion with pt.

## 2021-02-25 ENCOUNTER — Telehealth: Payer: Self-pay

## 2021-02-25 ENCOUNTER — Ambulatory Visit (INDEPENDENT_AMBULATORY_CARE_PROVIDER_SITE_OTHER): Payer: Medicare Other | Admitting: Dermatology

## 2021-02-25 ENCOUNTER — Other Ambulatory Visit: Payer: Self-pay

## 2021-02-25 DIAGNOSIS — L578 Other skin changes due to chronic exposure to nonionizing radiation: Secondary | ICD-10-CM | POA: Diagnosis not present

## 2021-02-25 DIAGNOSIS — L820 Inflamed seborrheic keratosis: Secondary | ICD-10-CM

## 2021-02-25 DIAGNOSIS — L2081 Atopic neurodermatitis: Secondary | ICD-10-CM

## 2021-02-25 DIAGNOSIS — Z85828 Personal history of other malignant neoplasm of skin: Secondary | ICD-10-CM | POA: Diagnosis not present

## 2021-02-25 DIAGNOSIS — D1800 Hemangioma unspecified site: Secondary | ICD-10-CM | POA: Diagnosis not present

## 2021-02-25 DIAGNOSIS — L814 Other melanin hyperpigmentation: Secondary | ICD-10-CM | POA: Diagnosis not present

## 2021-02-25 DIAGNOSIS — L570 Actinic keratosis: Secondary | ICD-10-CM | POA: Diagnosis not present

## 2021-02-25 DIAGNOSIS — L281 Prurigo nodularis: Secondary | ICD-10-CM

## 2021-02-25 DIAGNOSIS — Z1283 Encounter for screening for malignant neoplasm of skin: Secondary | ICD-10-CM

## 2021-02-25 DIAGNOSIS — D229 Melanocytic nevi, unspecified: Secondary | ICD-10-CM | POA: Diagnosis not present

## 2021-02-25 DIAGNOSIS — L821 Other seborrheic keratosis: Secondary | ICD-10-CM | POA: Diagnosis not present

## 2021-02-25 NOTE — Patient Instructions (Signed)

## 2021-02-25 NOTE — Telephone Encounter (Signed)
Pt states that he was to call the office with the name of the topical he has at home. He states that is it [facility] 0.1% cream.

## 2021-02-25 NOTE — Progress Notes (Signed)
Follow-Up Visit   Subjective  Brett Bender is a 75 y.o. male who presents for the following: UBSE (Hx AK's, BCC's). The patient presents for Upper Body Skin Exam (UBSE) for skin cancer screening and mole check.  The following portions of the chart were reviewed this encounter and updated as appropriate:   Tobacco  Allergies  Meds  Problems  Med Hx  Surg Hx  Fam Hx     Review of Systems:  No other skin or systemic complaints except as noted in HPI or Assessment and Plan.  Objective  Well appearing patient in no apparent distress; mood and affect are within normal limits.  All skin waist up examined.  Objective  Forehead/temples x 25 (25): Erythematous thin papules/macules with gritty scale.   Objective  Trunk, extremities: Crusted ulceration of the L neck/shoulder.  Objective  Forehead and temples x 5: Erythematous keratotic or waxy stuck-on papule or plaque.   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 May consider Dupixent in near future if pt interested and does not improve. Ulceration cleaned with Puracyn spray, Mupirocin 2% ointment applied to aa and covered with a band-aid.   Continue Mupirocin 2% ointment daily.   Inflamed seborrheic keratosis Forehead and temples x 5 Destruction of lesion - Forehead and temples x 5 Complexity: simple   Destruction  method: cryotherapy   Informed consent: discussed and consent obtained   Timeout:  patient name, date of birth, surgical site, and procedure verified Lesion destroyed using liquid nitrogen: Yes   Region frozen until ice ball extended beyond lesion: Yes   Outcome: patient tolerated procedure well with no complications   Post-procedure details: wound care instructions given    Skin cancer screening   Lentigines - Scattered tan macules - Due to sun exposure - Benign-appering, observe - Recommend daily broad spectrum sunscreen SPF 30+ to sun-exposed areas, reapply every 2 hours as needed. - Call for any changes  Seborrheic Keratoses - Stuck-on, waxy, tan-brown papules and/or plaques  - Benign-appearing - Discussed benign etiology and prognosis. - Observe - Call for any changes  Melanocytic Nevi - Tan-brown and/or pink-flesh-colored symmetric macules and papules - Benign appearing on exam today - Observation - Call clinic for new or changing moles - Recommend daily use of broad spectrum spf 30+ sunscreen to sun-exposed areas.   Hemangiomas - Red papules - Discussed benign nature - Observe - Call for any changes  Actinic Damage - Chronic condition, secondary to cumulative UV/sun exposure - diffuse scaly erythematous macules with underlying dyspigmentation - Recommend daily broad spectrum sunscreen SPF 30+ to sun-exposed areas, reapply every 2 hours as needed.  - Staying in the shade or wearing long sleeves, sun glasses (UVA+UVB protection) and wide brim hats (4-inch brim around the entire circumference of the hat) are also recommended for sun protection.  - Call for new or changing lesions.  Actinic Damage - Severe, confluent actinic changes with pre-cancerous actinic keratoses  -  Severe, chronic, not at goal, secondary to cumulative UV radiation exposure over time - diffuse scaly erythematous macules and papules with underlying dyspigmentation - Discussed Prescription "Field  Treatment" for Severe, Chronic Confluent Actinic Changes with Pre-Cancerous Actinic Keratoses Field treatment involves treatment of an entire area of skin that has confluent Actinic Changes (Sun/ Ultraviolet light damage) and PreCancerous Actinic Keratoses by method of PhotoDynamic Therapy (PDT) and/or prescription Topical Chemotherapy agents such as 5-fluorouracil, 5-fluorouracil/calcipotriene, and/or imiquimod.  The purpose is to decrease the number of clinically evident and subclinical PreCancerous lesions to prevent progression to development of skin cancer by chemically destroying early precancer changes that may or may not be visible.  It has been shown to reduce the risk of developing skin cancer in the treated area. As a result of treatment, redness, scaling, crusting, and open sores may occur during treatment course. One or more than one of these methods may be used and may have to be used several times to control, suppress and eliminate the PreCancerous changes. Discussed treatment course, expected reaction, and possible side effects. - Recommend daily broad spectrum sunscreen SPF 30+ to sun-exposed areas, reapply every 2 hours as needed.  - Staying in the shade or wearing long sleeves, sun glasses (UVA+UVB protection) and wide brim hats (4-inch brim around the entire circumference of the hat) are also recommended. - Call for new or changing lesions. - Pt declines PDT and topical at this time.  May consider at fu appt.  History of Basal Cell Carcinoma of the Skin - No evidence of recurrence today - Recommend regular full body skin exams - Recommend daily broad spectrum sunscreen SPF 30+ to sun-exposed areas, reapply every 2 hours as needed.  - Call if any new or changing lesions are noted between office visits  Return in about 6 months (around 08/27/2021) for TBSE.  Luther Redo, CMA, am acting as scribe for Sarina Ser, MD .  Documentation: I have reviewed the above documentation  for accuracy and completeness, and I agree with the above.  Sarina Ser, MD

## 2021-02-26 NOTE — Telephone Encounter (Signed)
OK.  Please let him know that the Triamcinolone is for rashes and irritated areas; poison ivy etc. If he needs something for this type of condition, we can send in a Rx for a similar cream:  Mometasone cream apply bid prn aa rashes disp 30 g 0rf

## 2021-02-27 ENCOUNTER — Encounter: Payer: Self-pay | Admitting: Dermatology

## 2021-03-04 ENCOUNTER — Other Ambulatory Visit: Payer: Self-pay

## 2021-03-04 MED ORDER — MOMETASONE FUROATE 0.1 % EX CREA: TOPICAL_CREAM | CUTANEOUS | 0 refills | Status: DC

## 2021-04-16 DIAGNOSIS — H40003 Preglaucoma, unspecified, bilateral: Secondary | ICD-10-CM | POA: Diagnosis not present

## 2021-04-17 ENCOUNTER — Other Ambulatory Visit: Payer: Self-pay | Admitting: Family Medicine

## 2021-04-28 ENCOUNTER — Ambulatory Visit (INDEPENDENT_AMBULATORY_CARE_PROVIDER_SITE_OTHER): Payer: Medicare Other | Admitting: Family Medicine

## 2021-04-28 ENCOUNTER — Encounter: Payer: Self-pay | Admitting: Family Medicine

## 2021-04-28 ENCOUNTER — Other Ambulatory Visit: Payer: Self-pay

## 2021-04-28 VITALS — BP 133/86 | HR 52 | Wt 216.0 lb

## 2021-04-28 DIAGNOSIS — I10 Essential (primary) hypertension: Secondary | ICD-10-CM

## 2021-04-28 NOTE — Progress Notes (Signed)
Established patient visit   Patient: Brett Bender   DOB: 1945/12/12   75 y.o. Male  MRN: [id number] Visit Date: 04/28/2021  Today's healthcare provider: Lelon Huh, MD   No chief complaint on file.  Subjective    HPI  Hypertension, follow-up  BP Readings from Last 3 Encounters:  04/28/21 133/86  12/30/20 138/88  08/26/20 132/82   Wt Readings from Last 3 Encounters:  04/28/21 216 lb (98 kg)  12/30/20 217 lb (98.4 kg)  08/26/20 212 lb (96.2 kg)     He was last seen for hypertension 4 months ago.  BP at that visit was 138/88. Management since that visit includes increasing amlodipine to 10mg  and advising patient to recheck in 3-4 months.   He reports excellent compliance with treatment. He is not having side effects.  He is following a Regular diet. He is exercising. He does not smoke.  Use of agents associated with hypertension: NSAIDS.   Outside blood pressures are not being checked at home. Symptoms: No chest pain No chest pressure  No palpitations No syncope  No dyspnea No orthopnea  No paroxysmal nocturnal dyspnea No lower extremity edema   Pertinent labs: Lab Results  Component Value Date   CHOL 173 12/31/2020   HDL 44 12/31/2020   LDLCALC 107 (H) 12/31/2020   TRIG 123 12/31/2020   CHOLHDL 3.9 12/31/2020   Lab Results  Component Value Date   NA 137 12/31/2020   K 4.9 12/31/2020   CREATININE 1.22 12/31/2020   GFRNONAA 58 (L) 12/31/2020   GFRAA 67 12/31/2020   GLUCOSE 107 (H) 12/31/2020     The 10-year ASCVD risk score Mikey Bussing DC Jr., et al., 2013) is: 28.2%   ---------------------------------------------------------------------------------------------------      Medications: Outpatient Medications Prior to Visit  Medication Sig   ALPRAZolam (XANAX) 0.5 MG tablet Take 0.5 mg by mouth at bedtime as needed for anxiety.   amLODipine (NORVASC) 10 MG tablet Take 1 tablet (10 mg total) by mouth daily.   aspirin 81 MG chewable tablet Chew  by mouth daily.   atenolol-chlorthalidone (TENORETIC) 50-25 MG tablet TAKE 1/2 TABLET BY MOUTH ONCE DAILY   butalbital-acetaminophen-caffeine (BAC) 50-325-40 MG tablet TAKE 1 OR 2 TABLETS BY MOUTH EVERY 6 HOURS AS NEEDED FOR HEADACHE   diclofenac (VOLTAREN) 50 MG EC tablet TAKE 1 TABLET BY MOUTH TWICE (2) DAILY   diclofenac Sodium (VOLTAREN) 1 % GEL Apply 4 g topically 4 (four) times daily.   finasteride (PROSCAR) 5 MG tablet Take 1 tablet (5 mg total) by mouth daily.   latanoprost (XALATAN) 0.005 % ophthalmic solution Place 1 drop into both eyes at bedtime.    mirtazapine (REMERON) 30 MG tablet TAKE 1 TABLET BY MOUTH EVERY NIGHT AT BEDTIME   mometasone (ELOCON) 0.1 % cream Apply twice a day as needed to affected areas with rash   Multiple Vitamin (MULTIVITAMIN) tablet Take 1 tablet by mouth daily.   mupirocin ointment (BACTROBAN) 2 % Apply 1 application topically 2 (two) times daily.   ondansetron (ZOFRAN ODT) 4 MG disintegrating tablet Take 1 tablet (4 mg total) by mouth every 8 (eight) hours as needed for nausea or vomiting.   Probiotic Product (DIGESTIVE ADVANTAGE PO) Take 1 tablet by mouth daily.   psyllium (METAMUCIL) 58.6 % powder Take 1 packet by mouth daily as needed (regularity).    tamsulosin (FLOMAX) 0.4 MG CAPS capsule Take 1 capsule (0.4 mg total) by mouth daily.   timolol (TIMOPTIC) 0.5 %  ophthalmic solution Place 1 drop into both eyes daily.    No facility-administered medications prior to visit.    Review of Systems  Constitutional: Negative.   Respiratory: Negative.    Cardiovascular: Negative.   Gastrointestinal:  Positive for abdominal distention and constipation. Negative for abdominal pain, anal bleeding, blood in stool, diarrhea, nausea, rectal pain and vomiting.  Neurological:  Negative for dizziness, light-headedness and headaches.      Objective    BP 133/86 (BP Location: Left Arm, Patient Position: Sitting, Cuff Size: Large)   Pulse (!) 52   Wt 216 lb (98  kg)   SpO2 100%   BMI 27.00 kg/m     Physical Exam  General appearance:  Overweight male, cooperative and in no acute distress Head: Normocephalic, without obvious abnormality, atraumatic Respiratory: Respirations even and unlabored, normal respiratory rate Extremities: All extremities are intact.  Skin: Skin color, texture, turgor normal. No rashes seen  Psych: Appropriate mood and affect. Neurologic: Mental status: Alert, oriented to person, place, and time, thought content appropriate.     Assessment & Plan     1. Essential (primary) hypertension Much better controlled with increase dose of amlodipine. Continue current medications.  Continue regular exercise. Follow up for CPE in February 2023      The entirety of the information documented in the History of Present Illness, Review of Systems and Physical Exam were personally obtained by me. Portions of this information were initially documented by the CMA and reviewed by me for thoroughness and accuracy.     Lelon Huh, MD  [facility] [phone] (phone) [phone] (fax)  [place]

## 2021-04-30 ENCOUNTER — Other Ambulatory Visit: Payer: Self-pay | Admitting: Urology

## 2021-04-30 ENCOUNTER — Other Ambulatory Visit: Payer: Self-pay | Admitting: Family Medicine

## 2021-04-30 DIAGNOSIS — M1990 Unspecified osteoarthritis, unspecified site: Secondary | ICD-10-CM

## 2021-04-30 DIAGNOSIS — N138 Other obstructive and reflux uropathy: Secondary | ICD-10-CM

## 2021-05-27 ENCOUNTER — Other Ambulatory Visit: Payer: Self-pay | Admitting: Family Medicine

## 2021-05-27 DIAGNOSIS — G4700 Insomnia, unspecified: Secondary | ICD-10-CM

## 2021-05-27 DIAGNOSIS — F32A Depression, unspecified: Secondary | ICD-10-CM

## 2021-05-27 DIAGNOSIS — Z03818 Encounter for observation for suspected exposure to other biological agents ruled out: Secondary | ICD-10-CM | POA: Diagnosis not present

## 2021-05-27 DIAGNOSIS — Z20822 Contact with and (suspected) exposure to covid-19: Secondary | ICD-10-CM | POA: Diagnosis not present

## 2021-05-28 ENCOUNTER — Other Ambulatory Visit: Payer: Self-pay

## 2021-05-28 ENCOUNTER — Ambulatory Visit (INDEPENDENT_AMBULATORY_CARE_PROVIDER_SITE_OTHER): Payer: Medicare Other | Admitting: Internal Medicine

## 2021-05-28 ENCOUNTER — Encounter: Payer: Self-pay | Admitting: Internal Medicine

## 2021-05-28 ENCOUNTER — Ambulatory Visit: Payer: Self-pay | Admitting: *Deleted

## 2021-05-28 VITALS — Temp 99.9°F

## 2021-05-28 DIAGNOSIS — U071 COVID-19: Secondary | ICD-10-CM

## 2021-05-28 MED ORDER — CARESTART COVID-19 HOME TEST VI KIT
PACK | 0 refills | Status: DC
  Filled 2021-05-28: qty 2, 4d supply, fill #0

## 2021-05-28 MED ORDER — BENZONATATE 100 MG PO CAPS: 100.0000 mg | ORAL_CAPSULE | Freq: Two times a day (BID) | ORAL | 0 refills | Status: DC | PRN

## 2021-05-28 MED ORDER — MOLNUPIRAVIR EUA 200MG CAPSULE
4.0000 | ORAL_CAPSULE | Freq: Two times a day (BID) | ORAL | 0 refills | Status: AC
  Filled 2021-05-28: qty 40, 5d supply, fill #0

## 2021-05-28 MED ORDER — FEXOFENADINE HCL 180 MG PO TABS: 180.0000 mg | ORAL_TABLET | Freq: Every day | ORAL | 1 refills | Status: DC

## 2021-05-28 NOTE — Telephone Encounter (Signed)
Reason for CRM: Patient has received a positive covid test from [facility] on 05/27/21   Patient shares that they are experiencing "slight" congestion, cough and muscle aches   Patient would like to discuss further when possible  Reason for Disposition  MILD difficulty breathing (e.g., minimal/no SOB at rest, SOB with walking, pulse <100)  Answer Assessment - Initial Assessment Questions 1. COVID-19 DIAGNOSIS: "Who made your COVID-19 diagnosis?" "Was it confirmed by a positive lab test or self-test?" If not diagnosed by a doctor (or NP/PA), ask "Are there lots of cases (community spread) where you live?" Note: See public health department website, if unsure.     PCR 2. COVID-19 EXPOSURE: "Was there any known exposure to COVID before the symptoms began?" CDC Definition of close contact: within 6 feet (2 meters) for a total of 15 minutes or more over a 24-hour period.      nO 3. ONSET: "When did the COVID-19 symptoms start?"      Tuesday 4. WORST SYMPTOM: "What is your worst symptom?" (e.g., cough, fever, shortness of breath, muscle aches)     Sore throat,cough,aches 5. COUGH: "Do you have a cough?" If Yes, ask: "How bad is the cough?"       Yes - moderate 6. FEVER: "Do you have a fever?" If Yes, ask: "What is your temperature, how was it measured, and when did it start?"     No 7. RESPIRATORY STATUS: "Describe your breathing?" (e.g., shortness of breath, wheezing, unable to speak)      No 8. BETTER-SAME-WORSE: "Are you getting better, staying the same or getting worse compared to yesterday?"  If getting worse, ask, "In what way?"     Same 9. HIGH RISK DISEASE: "Do you have any chronic medical problems?" (e.g., asthma, heart or lung disease, weak immune system, obesity, etc.)     HTN 10. VACCINE: "Have you had the COVID-19 vaccine?" If Yes, ask: "Which one, how many shots, when did you get it?"       yES 11. BOOSTER: "Have you received your COVID-19 booster?" If Yes, ask: "Which one  and when did you get it?"       Yes 12. PREGNANCY: "Is there any chance you are pregnant?" "When was your last menstrual period?"       N/a 13. OTHER SYMPTOMS: "Do you have any other symptoms?"  (e.g., chills, fatigue, headache, loss of smell or taste, muscle pain, sore throat)       No 14. O2 SATURATION MONITOR:  "Do you use an oxygen saturation monitor (pulse oximeter) at home?" If Yes, ask "What is your reading (oxygen level) today?" "What is your usual oxygen saturation reading?" (e.g., 95%)       no  Protocols used: Coronavirus (COVID-19) Diagnosed or Suspected-A-AH

## 2021-05-28 NOTE — Telephone Encounter (Signed)
Pt. Repots he tested positive for COVID 19 yesterday. Symptoms started Tuesday. Has cough, sore throat, body aches. Requesting a virtual visit to talk about anti-virals. No availability. States he cannot do "My Chart e-visit.I'm not good with the computer." Agreeable if he could have a virtual visit at [facility]. Please advise.

## 2021-05-28 NOTE — Progress Notes (Signed)
Temp 99.9 F (37.7 C) (Oral)    Subjective:    Patient ID: Brett Bender, male    DOB: [birth year], 75 y.o.   MRN: [id number]  Chief Complaint  Patient presents with  . Covid Positive    Tested positive on Wednesday, symptoms: scratchy throat, cough, body ache, chills and fever    HPI: Brett Bender is a 75 y.o. male   This visit was completed via telephone due to the restrictions of the COVID-19 pandemic. All issues as above were discussed and addressed but no physical exam was performed. If it was felt that the patient should be evaluated in the office, they were directed there. The patient verbally consented to this visit. Patient was unable to complete an audio/visual visit due to Technical difficulties. Due to the catastrophic nature of the COVID-19 pandemic, this visit was done through audio contact only. Location of the patient: home Location of the provider: work Those involved with this call:  Provider: Charlynne Cousins, MD CMA: Frazier Butt, [place] Desk/Registration: Levert Feinstein  Time spent on call: 10 minutes on the phone discussing health concerns. 10 minutes total spent in review of patient's record and preparation of their chart.  Patient presents with: Covid Positive: had a PCR +ve  Tested positive on Wednesday, symptoms: scratchy throat, cough, body ache, chills and fever.  Woke up yesterday morning  Has the chills today and nausea.     URI  This is a new (has had a fever) problem. The current episode started in the past 7 days.   Chief Complaint  Patient presents with  . Covid Positive    Tested positive on Wednesday, symptoms: scratchy throat, cough, body ache, chills and fever    Relevant past medical, surgical, family and social history reviewed and updated as indicated. Interim medical history since our last visit reviewed. Allergies and medications reviewed and updated.  Review of Systems  Per HPI unless specifically indicated above      Objective:    Temp 99.9 F (37.7 C) (Oral)   Wt Readings from Last 3 Encounters:  04/28/21 216 lb (98 kg)  12/30/20 217 lb (98.4 kg)  08/26/20 212 lb (96.2 kg)    Physical Exam Constitutional:      General: He is not in acute distress.    Appearance: He is ill-appearing. He is not toxic-appearing or diaphoretic.  HENT:     Head: Normocephalic and atraumatic.     Nose: No congestion or rhinorrhea.  Neurological:     Mental Status: He is alert.  Psychiatric:        Mood and Affect: Mood normal.        Behavior: Behavior normal.        Thought Content: Thought content normal.        Judgment: Judgment normal.   Results for orders placed or performed in visit on 12/30/20  CBC with Differential/Platelet  Result Value Ref Range   WBC 8.9 3.4 - 10.8 x10E3/uL   RBC 5.37 4.14 - 5.80 x10E6/uL   Hemoglobin 17.1 13.0 - 17.7 g/dL   Hematocrit 50.1 37.5 - 51.0 %   MCV 93 79 - 97 fL   MCH 31.8 26.6 - 33.0 pg   MCHC 34.1 31.5 - 35.7 g/dL   RDW 12.8 11.6 - 15.4 %   Platelets 250 150 - 450 x10E3/uL   Neutrophils 40 Not Estab. %   Lymphs 49 Not Estab. %   Monocytes 8 Not Estab. %  Eos 2 Not Estab. %   Basos 1 Not Estab. %   Neutrophils Absolute 3.6 1.4 - 7.0 x10E3/uL   Lymphocytes Absolute 4.3 (H) 0.7 - 3.1 x10E3/uL   Monocytes Absolute 0.7 0.1 - 0.9 x10E3/uL   EOS (ABSOLUTE) 0.2 0.0 - 0.4 x10E3/uL   Basophils Absolute 0.1 0.0 - 0.2 x10E3/uL   Immature Granulocytes 0 Not Estab. %   Immature Grans (Abs) 0.0 0.0 - 0.1 x10E3/uL  Comprehensive metabolic panel  Result Value Ref Range   Glucose 107 (H) 65 - 99 mg/dL   BUN 21 8 - 27 mg/dL   Creatinine, Ser 1.22 0.76 - 1.27 mg/dL   GFR calc non Af Amer 58 (L) >59 mL/min/1.73   GFR calc Af Amer 67 >59 mL/min/1.73   BUN/Creatinine Ratio 17 10 - 24   Sodium 137 134 - 144 mmol/L   Potassium 4.9 3.5 - 5.2 mmol/L   Chloride 99 96 - 106 mmol/L   CO2 23 20 - 29 mmol/L   Calcium 10.0 8.6 - 10.2 mg/dL   Total Protein 7.4 6.0 - 8.5 g/dL    Albumin 4.5 3.7 - 4.7 g/dL   Globulin, Total 2.9 1.5 - 4.5 g/dL   Albumin/Globulin Ratio 1.6 1.2 - 2.2   Bilirubin Total 0.7 0.0 - 1.2 mg/dL   Alkaline Phosphatase 74 44 - 121 IU/L   AST 22 0 - 40 IU/L   ALT 20 0 - 44 IU/L  PSA Total (Reflex To Free) ([facility] only)  Result Value Ref Range   Prostate Specific Ag, Serum 0.1 0.0 - 4.0 ng/mL   Reflex Criteria Comment   Lipid panel  Result Value Ref Range   Cholesterol, Total 173 100 - 199 mg/dL   Triglycerides 123 0 - 149 mg/dL   HDL 44 >39 mg/dL   VLDL Cholesterol Cal 22 5 - 40 mg/dL   LDL Chol Calc (NIH) 107 (H) 0 - 99 mg/dL   Chol/HDL Ratio 3.9 0.0 - 5.0 ratio        Current Outpatient Medications:  .  ALPRAZolam (XANAX) 0.5 MG tablet, Take 0.5 mg by mouth at bedtime as needed for anxiety., Disp: , Rfl:  .  amLODipine (NORVASC) 10 MG tablet, Take 1 tablet (10 mg total) by mouth daily., Disp: 90 tablet, Rfl: 1 .  aspirin 81 MG chewable tablet, Chew by mouth daily., Disp: , Rfl:  .  atenolol-chlorthalidone (TENORETIC) 50-25 MG tablet, TAKE 1/2 TABLET BY MOUTH ONCE DAILY, Disp: 45 tablet, Rfl: 3 .  benzonatate (TESSALON) 100 MG capsule, Take 1 capsule (100 mg total) by mouth 2 (two) times daily as needed for cough., Disp: 20 capsule, Rfl: 0 .  butalbital-acetaminophen-caffeine (BAC) 50-325-40 MG tablet, TAKE 1 OR 2 TABLETS BY MOUTH EVERY 6 HOURS AS NEEDED FOR HEADACHE, Disp: 20 tablet, Rfl: 2 .  diclofenac (VOLTAREN) 50 MG EC tablet, TAKE 1 TABLET BY MOUTH TWICE (2) DAILY, Disp: 180 tablet, Rfl: 0 .  diclofenac Sodium (VOLTAREN) 1 % GEL, APPLY 4G TOPICALLY FIVE TIMES DAILY, Disp: 100 g, Rfl: 4 .  fexofenadine (ALLEGRA ALLERGY) 180 MG tablet, Take 1 tablet (180 mg total) by mouth daily., Disp: 10 tablet, Rfl: 1 .  finasteride (PROSCAR) 5 MG tablet, Take 1 tablet (5 mg total) by mouth daily., Disp: 90 tablet, Rfl: 3 .  latanoprost (XALATAN) 0.005 % ophthalmic solution, Place 1 drop into both eyes at bedtime. , Disp: , Rfl:  .   mirtazapine (REMERON) 30 MG tablet, TAKE 1 TABLET BY MOUTH EVERY  NIGHT AT BEDTIME, Disp: 30 tablet, Rfl: 12 .  molnupiravir EUA 200 mg CAPS, Take 4 capsules (800 mg total) by mouth 2 (two) times daily for 5 days., Disp: 40 capsule, Rfl: 0 .  mometasone (ELOCON) 0.1 % cream, Apply twice a day as needed to affected areas with rash, Disp: 30 g, Rfl: 0 .  Multiple Vitamin (MULTIVITAMIN) tablet, Take 1 tablet by mouth daily., Disp: , Rfl:  .  mupirocin ointment (BACTROBAN) 2 %, Apply 1 application topically 2 (two) times daily., Disp: 22 g, Rfl: 3 .  ondansetron (ZOFRAN ODT) 4 MG disintegrating tablet, Take 1 tablet (4 mg total) by mouth every 8 (eight) hours as needed for nausea or vomiting., Disp: 20 tablet, Rfl: 0 .  Probiotic Product (DIGESTIVE ADVANTAGE PO), Take 1 tablet by mouth daily., Disp: , Rfl:  .  psyllium (METAMUCIL) 58.6 % powder, Take 1 packet by mouth daily as needed (regularity). , Disp: , Rfl:  .  tamsulosin (FLOMAX) 0.4 MG CAPS capsule, TAKE 1 CAPSULE BY MOUTH ONCE DAILY, Disp: 90 capsule, Rfl: 0 .  timolol (TIMOPTIC) 0.5 % ophthalmic solution, Place 1 drop into both eyes daily. , Disp: , Rfl:     Assessment & Plan:  COVID : positive :  Increase fluid intake.  Start pt on molnupiravir  Headahce - tyelnol every 4-6 hrs prn and alternate this with ibubrufen 800 mg q 8 hrly. Sinus pressure: use steam inhalation.  OTC -  Allegra / claritin.  5 days quarantine.  Ok to rtw in 5 days if tests -ve follow. Pt verbalized understanding of such, to get to the office at today and get a curb side test for the above.   Problem List Items Addressed This Visit   None   No orders of the defined types were placed in this encounter.    Meds ordered this encounter  Medications  . molnupiravir EUA 200 mg CAPS    Sig: Take 4 capsules (800 mg total) by mouth 2 (two) times daily for 5 days.    Dispense:  40 capsule    Refill:  0  . benzonatate (TESSALON) 100 MG capsule    Sig: Take 1  capsule (100 mg total) by mouth 2 (two) times daily as needed for cough.    Dispense:  20 capsule    Refill:  0  . fexofenadine (ALLEGRA ALLERGY) 180 MG tablet    Sig: Take 1 tablet (180 mg total) by mouth daily.    Dispense:  10 tablet    Refill:  1     Follow up plan: No follow-ups on file.

## 2021-05-28 NOTE — Telephone Encounter (Signed)
Telephone visit scheduled with [facility] today at 2:20pm. Patient notified.

## 2021-06-03 NOTE — Progress Notes (Signed)
2:53 PM   Brett Bender 1946/02/28 [id number]  Referring provider: Birdie Sons, MD [address] [place],  [place] [address]  Urological history: 1. High risk hematuria -non-smoker -CTU 2020 NED -cysto 2020 NED -urine cytology NED 2020 -no reports of gross heme -UA neg for micro heme  2. Nephrolithiasis -Stone composition was calcium oxalate dihydrate 30%, calcium oxalate monohydrate 60% and calcium phosphate 10% -[facility] results noted he was at increased risk for stone formation due to borderline hyperoxaluria, hypocitraturia, high urine pH and mild hyperurlcosuria.  He was given instruction on dietary changes and encouraged to drink 2.5 L of water daily -URS 2017 -CTU 2020 stones throughout the right kidney measuring up to 5 mm within the superior pole and 4 mm within the inferior pole. No ureterolithiasis. No hydronephrosis -KUB 2022 6 stones visible in the right kidney with largest 6 mm in size and 2 faint calcifications in the left   3. BPH with LU TS -PSA 0.1 in 12/2020 -I PSS 14/3 -cysto 2020 enlarged prostate bilobar coaptation, 4 cm prosthetic length -managed with tamsulosin 0.4 mg daily and finasteride 5 mg daily   Chief Complaint  Patient presents with   Follow-up    1 year follow-up    HPI: Brett Bender is a 75 y.o. male who presents today for yearly follow up.   He has the complaint of a weak urinary stream.  Patient denies any modifying or aggravating factors.  Patient denies any gross hematuria, dysuria or suprapubic/flank pain.  Patient denies any fevers, chills, nausea or vomiting.     IPSS     Row Name 06/04/21 0900         International Prostate Symptom Score   How often have you had the sensation of not emptying your bladder? About half the time     How often have you had to urinate less than every two hours? About half the time     How often have you found you stopped and started again several times when you urinated?  Less than half the time     How often have you found it difficult to postpone urination? Less than 1 in 5 times     How often have you had a weak urinary stream? About half the time     How often have you had to strain to start urination? Less than 1 in 5 times     How many times did you typically get up at night to urinate? 1 Time     Total IPSS Score 14           Quality of Life due to urinary symptoms     If you were to spend the rest of your life with your urinary condition just the way it is now how would you feel about that? Mixed              Score:  1-7 Mild 8-19 Moderate 20-35 Severe   PMH: Past Medical History:  Diagnosis Date   Actinic keratosis    Arthritis    Basal cell carcinoma 11/09/2013   R lateral edge of clavicle   Basal cell carcinoma 02/03/2016   L lateral supraclavicular base of neck/excision   Basal cell carcinoma 11/29/2018   L upper back   Basal cell carcinoma 09/11/2020   R mid back    Cancer ([facility])    Basal cell carcinoma bilateral arms and left shoulder   Complication of  anesthesia    slow to wake up with shoulder surgery   Depression    Enlarged prostate    H/O elbow surgery    left   Herpes simplex    History of kidney stones    History of MRSA infection 2004   skin around eye   Hypertension    Kidney stone    Lumbar stenosis    Migraine headache    Motion sickness    ocean boats   PONV (postoperative nausea and vomiting)    Tendonitis of ankle, left     Surgical History: Past Surgical History:  Procedure Laterality Date   APPENDECTOMY     back injection     BACK SURGERY     CYSTOSCOPY/URETEROSCOPY/HOLMIUM LASER/STENT PLACEMENT Left 12/18/2015   Procedure: CYSTOSCOPY/URETEROSCOPY/HOLMIUM LASER LITHOTRIPSY;  Surgeon: Hollice Espy, MD;  Location: [facility];  Service: Urology;  Laterality: Left;   ELBOW SURGERY     LOWER BACK SURGERY  2010   L4-5 fusion   LUMBAR LAMINECTOMY/DECOMPRESSION MICRODISCECTOMY N/A 08/21/2018    Procedure: LUMBAR LAMINECTOMY/DECOMPRESSION MICRODISCECTOMY 1 LEVEL-L3/4;  Surgeon: Deetta Perla, MD;  Location: [facility];  Service: Neurosurgery;  Laterality: N/A;   NECK SURGERY     Upper neck surgery; with wires placed   SHOULDER SURGERY Right 11/10/2011   arthroscopic surgery . Dr. Tamala Julian, [place] Left 12/04/2019   Procedure: TRICEPS TENDON REPAIR, OLECRANON BURSA;  Surgeon: Leim Fabry, MD;  Location: [place];  Service: Orthopedics;  Laterality: Left;    Home Medications:  Allergies as of 06/04/2021       Reactions   Penicillins Other (See Comments)   Stomach Ache        Medication List        Accurate as of June 04, 2021 11:59 PM. If you have any questions, ask your nurse or doctor.          ALPRAZolam 0.5 MG tablet Commonly known as: XANAX Take 0.5 mg by mouth at bedtime as needed for anxiety.   amLODipine 10 MG tablet Commonly known as: NORVASC Take 1 tablet (10 mg total) by mouth daily.   aspirin 81 MG chewable tablet Chew by mouth daily.   atenolol-chlorthalidone 50-25 MG tablet Commonly known as: TENORETIC TAKE 1/2 TABLET BY MOUTH ONCE DAILY   benzonatate 100 MG capsule Commonly known as: TESSALON Take 1 capsule (100 mg total) by mouth 2 (two) times daily as needed for cough.   butalbital-acetaminophen-caffeine 50-325-40 MG tablet Commonly known as: Bac TAKE 1 OR 2 TABLETS BY MOUTH EVERY 6 HOURS AS NEEDED FOR HEADACHE   Carestart COVID-19 Home Test Kit Generic drug: COVID-19 At Home Antigen Test Use as directed   diclofenac 50 MG EC tablet Commonly known as: VOLTAREN TAKE 1 TABLET BY MOUTH TWICE (2) DAILY   diclofenac Sodium 1 % Gel Commonly known as: VOLTAREN APPLY 4G TOPICALLY FIVE TIMES DAILY   DIGESTIVE ADVANTAGE PO Take 1 tablet by mouth daily.   fexofenadine 180 MG tablet Commonly known as: Allegra Allergy Take 1 tablet (180 mg total) by mouth daily.   finasteride 5 MG  tablet Commonly known as: PROSCAR Take 1 tablet (5 mg total) by mouth daily.   latanoprost 0.005 % ophthalmic solution Commonly known as: XALATAN Place 1 drop into both eyes at bedtime.   mirtazapine 30 MG tablet Commonly known as: REMERON TAKE 1 TABLET BY MOUTH EVERY NIGHT AT BEDTIME   mometasone 0.1 % cream Commonly  known as: ELOCON Apply twice a day as needed to affected areas with rash   multivitamin tablet Take 1 tablet by mouth daily.   mupirocin ointment 2 % Commonly known as: BACTROBAN Apply 1 application topically 2 (two) times daily.   ondansetron 4 MG disintegrating tablet Commonly known as: Zofran ODT Take 1 tablet (4 mg total) by mouth every 8 (eight) hours as needed for nausea or vomiting.   psyllium 58.6 % powder Commonly known as: METAMUCIL Take 1 packet by mouth daily as needed (regularity).   tamsulosin 0.4 MG Caps capsule Commonly known as: FLOMAX TAKE 1 CAPSULE BY MOUTH ONCE DAILY   timolol 0.5 % ophthalmic solution Commonly known as: TIMOPTIC Place 1 drop into both eyes daily.        Allergies:  Allergies  Allergen Reactions   Penicillins Other (See Comments)    Stomach Ache    Family History: Family History  Problem Relation Age of Onset   Depression Son    Diabetes Neg Hx    Cancer Neg Hx    Heart attack Neg Hx    Kidney disease Neg Hx    Prostate cancer Neg Hx    Kidney cancer Neg Hx    Bladder Cancer Neg Hx     Social History:  reports that he has never smoked. He has never used smokeless tobacco. He reports that he does not drink alcohol and does not use drugs.  ROS: For pertinent review of systems please refer to history of present illness  Physical Exam: BP (!) 156/89   Pulse (!) 55   Ht '6\' 3"'  (1.905 m)   Wt 212 lb (96.2 kg)   BMI 26.50 kg/m   Constitutional:  Well nourished. Alert and oriented, No acute distress. HEENT: [place] AT, mask in place.  Trachea midline Cardiovascular: No clubbing, cyanosis, or  edema. Respiratory: Normal respiratory effort, no increased work of breathing. GU: No CVA tenderness.  No bladder fullness or masses.  Patient with circumcised phallus.  Urethral meatus is patent.  No penile discharge. No penile lesions or rashes. Scrotum without lesions, cysts, rashes and/or edema.  Testicles are located scrotally bilaterally. No masses are appreciated in the testicles. Left and right epididymis are normal. Rectal: Patient with  normal sphincter tone. Anus and perineum without scarring or rashes. No rectal masses are appreciated. Prostate is approximately 40 grams, no nodules are appreciated. Seminal vesicles are normal. Neurologic: Grossly intact, no focal deficits, moving all 4 extremities. Psychiatric: Normal mood and affect.   Laboratory Data: Results for orders placed or performed in visit on 06/04/21  Microscopic Examination   Urine  Result Value Ref Range   WBC, UA 0-5 0 - 5 /hpf   RBC 0-2 0 - 2 /hpf   Epithelial Cells (non renal) None seen 0 - 10 /hpf   Bacteria, UA None seen None seen/Few  Urinalysis, Complete  Result Value Ref Range   Specific Gravity, UA 1.020 1.005 - 1.030   pH, UA 7.0 5.0 - 7.5   Color, UA Yellow Yellow   Appearance Ur Clear Clear   Leukocytes,UA Negative Negative   Protein,UA Negative Negative/Trace   Glucose, UA Negative Negative   Ketones, UA Negative Negative   RBC, UA Negative Negative   Bilirubin, UA Negative Negative   Urobilinogen, Ur 0.2 0.2 - 1.0 mg/dL   Nitrite, UA Negative Negative   Microscopic Examination See below:     Lab Results  Component Value Date   WBC 8.9 12/31/2020  HGB 17.1 12/31/2020   HCT 50.1 12/31/2020   MCV 93 12/31/2020   PLT 250 12/31/2020    Lab Results  Component Value Date   CREATININE 1.22 12/31/2020    Lab Results  Component Value Date   PSA 0.1 11/28/2014   Component     Latest Ref Rng & Units 12/20/2016 03/01/2017 12/21/2017 12/26/2018  Prostate Specific Ag, Serum     0.0 - 4.0  ng/mL <0.1 <0.1 <0.1 <0.1   Component     Latest Ref Rng & Units 04/16/2019 12/31/2020  Prostate Specific Ag, Serum     0.0 - 4.0 ng/mL <0.1 0.1       Component Value Date/Time   CHOL 173 12/31/2020 0711   HDL 44 12/31/2020 0711   CHOLHDL 3.9 12/31/2020 0711   LDLCALC 107 (H) 12/31/2020 0711    Lab Results  Component Value Date   AST 22 12/31/2020   Lab Results  Component Value Date   ALT 20 12/31/2020   I have reviewed the labs.  Pertinent Imaging: Narrative & Impression  CLINICAL DATA:  Nephrolithiasis   EXAM: ABDOMEN - 1 VIEW   COMPARISON:  01/06/2021   FINDINGS: There are at least 5 calculi overlying the right renal mid and lower pole and 2 calculi overlying the left renal midpole measuring up to 6-7 mm in greatest dimension, similar in appearance to prior examination. No definite ureteral calculi. Normal abdominal gas pattern. Degenerative changes noted within the lumbar spine.   IMPRESSION: Stable bilateral nephrolithiasis, right greater than left. No urolithiasis.     Electronically Signed   By: Fidela Salisbury MD   On: 06/07/2021 00:16     I have independently reviewed the films.  See HPI.   Assessment & Plan:    1. BPH with LU TS -continue tamsulosin 0.4 mg and finasteride 5 mg daily; refills given  2. Nephrolithiasis -bilateral nephrolithiasis - stable -no episodes of flank pain or passage of stone  3. Gross hematuria -Hematuria work up completed in 05/2019 - findings positive for enlarge prostate -No report of gross hematuria  -UA 05/2021 negative for micro heme  Return in about 1 year (around 06/04/2022) for IPSS, KUB and exam.  These notes generated with voice recognition software. I apologize for typographical errors.  Zara Council, PA-C  [facility] [address] [place], [place] [address] [phone]

## 2021-06-04 ENCOUNTER — Other Ambulatory Visit: Payer: Self-pay | Admitting: *Deleted

## 2021-06-04 ENCOUNTER — Ambulatory Visit
Admission: RE | Admit: 2021-06-04 | Discharge: 2021-06-04 | Disposition: A | Discharge status: Home / Self Care | Payer: Medicare Other | Attending: Urology | Admitting: Urology

## 2021-06-04 ENCOUNTER — Ambulatory Visit
Admission: RE | Admit: 2021-06-04 | Discharge: 2021-06-04 | Disposition: A | Discharge status: Home / Self Care | Payer: Medicare Other | Source: Ambulatory Visit | Attending: Urology | Admitting: Urology

## 2021-06-04 ENCOUNTER — Ambulatory Visit (INDEPENDENT_AMBULATORY_CARE_PROVIDER_SITE_OTHER): Payer: Medicare Other | Admitting: Urology

## 2021-06-04 ENCOUNTER — Encounter: Payer: Self-pay | Admitting: Urology

## 2021-06-04 ENCOUNTER — Other Ambulatory Visit: Payer: Self-pay

## 2021-06-04 VITALS — BP 156/89 | HR 55 | Ht 75.0 in | Wt 212.0 lb

## 2021-06-04 DIAGNOSIS — N401 Enlarged prostate with lower urinary tract symptoms: Secondary | ICD-10-CM | POA: Diagnosis not present

## 2021-06-04 DIAGNOSIS — R319 Hematuria, unspecified: Secondary | ICD-10-CM

## 2021-06-04 DIAGNOSIS — M47816 Spondylosis without myelopathy or radiculopathy, lumbar region: Secondary | ICD-10-CM | POA: Diagnosis not present

## 2021-06-04 DIAGNOSIS — N200 Calculus of kidney: Secondary | ICD-10-CM | POA: Insufficient documentation

## 2021-06-04 DIAGNOSIS — N138 Other obstructive and reflux uropathy: Secondary | ICD-10-CM | POA: Diagnosis not present

## 2021-06-04 DIAGNOSIS — N529 Male erectile dysfunction, unspecified: Secondary | ICD-10-CM

## 2021-06-05 LAB — URINALYSIS, COMPLETE
Bilirubin, UA: NEGATIVE
Glucose, UA: NEGATIVE
Ketones, UA: NEGATIVE
Leukocytes,UA: NEGATIVE
Nitrite, UA: NEGATIVE
Protein,UA: NEGATIVE
RBC, UA: NEGATIVE
Specific Gravity, UA: 1.02 (ref 1.005–1.030)
Urobilinogen, Ur: 0.2 mg/dL (ref 0.2–1.0)
pH, UA: 7 (ref 5.0–7.5)

## 2021-06-05 LAB — MICROSCOPIC EXAMINATION
Bacteria, UA: NONE SEEN
Epithelial Cells (non renal): NONE SEEN /hpf (ref 0–10)

## 2021-06-16 ENCOUNTER — Other Ambulatory Visit: Payer: Self-pay | Admitting: Family Medicine

## 2021-06-16 DIAGNOSIS — I10 Essential (primary) hypertension: Secondary | ICD-10-CM

## 2021-06-16 MED ORDER — AMLODIPINE BESYLATE 10 MG PO TABS: 10.0000 mg | ORAL_TABLET | Freq: Every day | ORAL | 4 refills | Status: DC

## 2021-06-30 ENCOUNTER — Other Ambulatory Visit: Payer: Self-pay | Admitting: Urology

## 2021-06-30 DIAGNOSIS — N401 Enlarged prostate with lower urinary tract symptoms: Secondary | ICD-10-CM

## 2021-07-16 ENCOUNTER — Other Ambulatory Visit: Payer: Self-pay | Admitting: Family Medicine

## 2021-07-29 ENCOUNTER — Other Ambulatory Visit: Payer: Self-pay | Admitting: Urology

## 2021-07-29 DIAGNOSIS — N138 Other obstructive and reflux uropathy: Secondary | ICD-10-CM

## 2021-08-07 ENCOUNTER — Other Ambulatory Visit: Payer: Self-pay

## 2021-08-07 ENCOUNTER — Ambulatory Visit (INDEPENDENT_AMBULATORY_CARE_PROVIDER_SITE_OTHER): Payer: Medicare Other | Admitting: Physician Assistant

## 2021-08-07 ENCOUNTER — Encounter: Payer: Self-pay | Admitting: Physician Assistant

## 2021-08-07 VITALS — BP 130/82 | HR 51 | Ht 76.0 in | Wt 210.0 lb

## 2021-08-07 DIAGNOSIS — R300 Dysuria: Secondary | ICD-10-CM

## 2021-08-07 DIAGNOSIS — N390 Urinary tract infection, site not specified: Secondary | ICD-10-CM | POA: Diagnosis not present

## 2021-08-07 LAB — URINALYSIS, COMPLETE
Bilirubin, UA: NEGATIVE
Glucose, UA: NEGATIVE
Ketones, UA: NEGATIVE
Leukocytes,UA: NEGATIVE
Nitrite, UA: NEGATIVE
Protein,UA: NEGATIVE
RBC, UA: NEGATIVE
Specific Gravity, UA: 1.02 (ref 1.005–1.030)
Urobilinogen, Ur: 0.2 mg/dL (ref 0.2–1.0)
pH, UA: 7 (ref 5.0–7.5)

## 2021-08-07 LAB — MICROSCOPIC EXAMINATION: Bacteria, UA: NONE SEEN

## 2021-08-07 LAB — BLADDER SCAN AMB NON-IMAGING

## 2021-08-07 NOTE — Progress Notes (Signed)
08/07/2021 11:37 AM   Brett Bender September 01, 1946 [id number]  CC: Chief Complaint  Patient presents with   Follow-up   Urinary Tract Infection   HPI: Brett Bender is a 75 y.o. male with PMH high risk hematuria with benign work-up in 2020, nephrolithiasis, and BPH with LUTS on Flomax and finasteride who presents today for evaluation of dysuria.   Today he reports 2 to 3-day history of dysuria, increased hesitancy, and increased sensation of incomplete bladder emptying.  He states he had a similar episode 1 year ago, which spontaneously resolved.  He denies pelvic, groin, perineal, or testicular pain.  He also denies fever, chills, nausea, or vomiting.  He continues to take Flomax and finasteride and denies orthostasis.  In-office UA and microscopy today pan negative.  PVR 47 mL.   PMH: Past Medical History:  Diagnosis Date   Actinic keratosis    Arthritis    Basal cell carcinoma 11/09/2013   R lateral edge of clavicle   Basal cell carcinoma 02/03/2016   L lateral supraclavicular base of neck/excision   Basal cell carcinoma 11/29/2018   L upper back   Basal cell carcinoma 09/11/2020   R mid back    Cancer ([facility])    Basal cell carcinoma bilateral arms and left shoulder   Complication of anesthesia    slow to wake up with shoulder surgery   Depression    Enlarged prostate    H/O elbow surgery    left   Herpes simplex    History of kidney stones    History of MRSA infection 2004   skin around eye   Hypertension    Kidney stone    Lumbar stenosis    Migraine headache    Motion sickness    ocean boats   PONV (postoperative nausea and vomiting)    Tendonitis of ankle, left     Surgical History: Past Surgical History:  Procedure Laterality Date   APPENDECTOMY     back injection     BACK SURGERY     CYSTOSCOPY/URETEROSCOPY/HOLMIUM LASER/STENT PLACEMENT Left 12/18/2015   Procedure: CYSTOSCOPY/URETEROSCOPY/HOLMIUM LASER LITHOTRIPSY;  Surgeon: Hollice Espy, MD;   Location: [facility];  Service: Urology;  Laterality: Left;   ELBOW SURGERY     LOWER BACK SURGERY  2010   L4-5 fusion   LUMBAR LAMINECTOMY/DECOMPRESSION MICRODISCECTOMY N/A 08/21/2018   Procedure: LUMBAR LAMINECTOMY/DECOMPRESSION MICRODISCECTOMY 1 LEVEL-L3/4;  Surgeon: Deetta Perla, MD;  Location: [facility];  Service: Neurosurgery;  Laterality: N/A;   NECK SURGERY     Upper neck surgery; with wires placed   SHOULDER SURGERY Right 11/10/2011   arthroscopic surgery . Dr. Tamala Julian, [place] Left 12/04/2019   Procedure: TRICEPS TENDON REPAIR, OLECRANON BURSA;  Surgeon: Leim Fabry, MD;  Location: [place];  Service: Orthopedics;  Laterality: Left;    Home Medications:  Allergies as of 08/07/2021       Reactions   Penicillins Other (See Comments)   Stomach Ache        Medication List        Accurate as of August 07, 2021 11:37 AM. If you have any questions, ask your nurse or doctor.          STOP taking these medications    benzonatate 100 MG capsule Commonly known as: TESSALON Stopped by: Debroah Loop, PA-C   Carestart COVID-19 Home Test Kit Generic drug: COVID-19 At Home Antigen Test Stopped by: Debroah Loop, PA-C  ondansetron 4 MG disintegrating tablet Commonly known as: Zofran ODT Stopped by: Debroah Loop, PA-C       TAKE these medications    ALPRAZolam 0.5 MG tablet Commonly known as: XANAX Take 0.5 mg by mouth at bedtime as needed for anxiety.   amLODipine 10 MG tablet Commonly known as: NORVASC Take 1 tablet (10 mg total) by mouth daily.   aspirin 81 MG chewable tablet Chew by mouth daily.   atenolol-chlorthalidone 50-25 MG tablet Commonly known as: TENORETIC TAKE 1/2 TABLET BY MOUTH ONCE DAILY   butalbital-acetaminophen-caffeine 50-325-40 MG tablet Commonly known as: Bac TAKE 1 OR 2 TABLETS BY MOUTH EVERY 6 HOURS AS NEEDED FOR HEADACHE   diclofenac 50 MG EC  tablet Commonly known as: VOLTAREN Take 1 tablet (50 mg total) by mouth 2 (two) times daily as needed.   diclofenac Sodium 1 % Gel Commonly known as: VOLTAREN APPLY 4G TOPICALLY FIVE TIMES DAILY   DIGESTIVE ADVANTAGE PO Take 1 tablet by mouth daily.   fexofenadine 180 MG tablet Commonly known as: Allegra Allergy Take 1 tablet (180 mg total) by mouth daily.   finasteride 5 MG tablet Commonly known as: PROSCAR TAKE 1 TABLET BY MOUTH ONCE DAILY   latanoprost 0.005 % ophthalmic solution Commonly known as: XALATAN Place 1 drop into both eyes at bedtime.   mirtazapine 30 MG tablet Commonly known as: REMERON TAKE 1 TABLET BY MOUTH EVERY NIGHT AT BEDTIME   mometasone 0.1 % cream Commonly known as: ELOCON Apply twice a day as needed to affected areas with rash   multivitamin tablet Take 1 tablet by mouth daily.   mupirocin ointment 2 % Commonly known as: BACTROBAN Apply 1 application topically 2 (two) times daily.   psyllium 58.6 % powder Commonly known as: METAMUCIL Take 1 packet by mouth daily as needed (regularity).   tamsulosin 0.4 MG Caps capsule Commonly known as: FLOMAX TAKE 1 CAPSULE BY MOUTH ONCE DAILY   timolol 0.5 % ophthalmic solution Commonly known as: TIMOPTIC Place 1 drop into both eyes daily.        Allergies:  Allergies  Allergen Reactions   Penicillins Other (See Comments)    Stomach Ache    Family History: Family History  Problem Relation Age of Onset   Depression Son    Diabetes Neg Hx    Cancer Neg Hx    Heart attack Neg Hx    Kidney disease Neg Hx    Prostate cancer Neg Hx    Kidney cancer Neg Hx    Bladder Cancer Neg Hx     Social History:   reports that he has never smoked. He has never used smokeless tobacco. He reports that he does not drink alcohol and does not use drugs.  Physical Exam: BP 130/82   Pulse (!) 51   Ht '6\' 4"'  (1.93 m)   Wt 210 lb (95.3 kg)   BMI 25.56 kg/m   Constitutional:  Alert and oriented, no  acute distress, nontoxic appearing HEENT: [place], AT Cardiovascular: No clubbing, cyanosis, or edema Respiratory: Normal respiratory effort, no increased work of breathing Skin: No rashes, bruises or suspicious lesions Neurologic: Grossly intact, no focal deficits, moving all 4 extremities Psychiatric: Normal mood and affect  Laboratory Data: Results for orders placed or performed in visit on 08/07/21  Microscopic Examination   Urine  Result Value Ref Range   WBC, UA 0-5 0 - 5 /hpf   RBC 0-2 0 - 2 /hpf   Epithelial Cells (non renal)  0-10 0 - 10 /hpf   Bacteria, UA None seen None seen/Few  Urinalysis, Complete  Result Value Ref Range   Specific Gravity, UA 1.020 1.005 - 1.030   pH, UA 7.0 5.0 - 7.5   Color, UA Yellow Yellow   Appearance Ur Clear Clear   Leukocytes,UA Negative Negative   Protein,UA Negative Negative/Trace   Glucose, UA Negative Negative   Ketones, UA Negative Negative   RBC, UA Negative Negative   Bilirubin, UA Negative Negative   Urobilinogen, Ur 0.2 0.2 - 1.0 mg/dL   Nitrite, UA Negative Negative   Microscopic Examination See below:   BLADDER SCAN AMB NON-IMAGING  Result Value Ref Range   Scan Result 7m    Assessment & Plan:   1. Dysuria UA benign today, PVR WNL. Differential includes BPH versus chronic prostatitis. Counseled patient to increase Flomax to twice daily x2 weeks. Will send urine for culture and treat with 4 weeks of tissue penetrating abx if growth. If symptoms persist despite therapy, recommend cystoscopy with Dr. BErlene Quan Patient is in agreement with this plan. - BLADDER SCAN AMB NON-IMAGING - Urinalysis, Complete - CULTURE, URINE COMPREHENSIVE   Return for Will call with results.  SDebroah Loop PA-C  [facility] [address] [place] [phone]

## 2021-08-07 NOTE — Patient Instructions (Signed)
Continue Proscar (finasteride) once daily. For the next two weeks, INCREASE Flomax (tamsulosin) to twice daily (0.8 mg/day). I'll call you with your urine culture results and treat you with antibiotics if needed.  If your symptoms persist despite medication, I would like you to get another cystoscopy with Dr. Erlene Quan. We'll schedule that in the future if needed.

## 2021-08-13 ENCOUNTER — Telehealth: Payer: Self-pay

## 2021-08-13 LAB — CULTURE, URINE COMPREHENSIVE

## 2021-08-13 NOTE — Telephone Encounter (Signed)
-----   Message from [place], [place] sent at 08/13/2021  9:15 AM EDT ----- Urine culture negative, continue double dose of Flomax and call us in 2 weeks if no better. ----- Message ----- From: Lavone Neri Lab Results In Sent: 08/07/2021   1:36 PM EDT To: Debroah Loop, PA-C

## 2021-08-13 NOTE — Telephone Encounter (Signed)
Notified patient as advised, patient verbalized understanding.  

## 2021-08-26 ENCOUNTER — Encounter: Payer: Medicare Other | Admitting: Dermatology

## 2021-09-09 ENCOUNTER — Other Ambulatory Visit: Payer: Self-pay

## 2021-09-09 ENCOUNTER — Ambulatory Visit (INDEPENDENT_AMBULATORY_CARE_PROVIDER_SITE_OTHER): Payer: Medicare Other

## 2021-09-09 DIAGNOSIS — Z23 Encounter for immunization: Secondary | ICD-10-CM | POA: Diagnosis not present

## 2021-10-05 ENCOUNTER — Other Ambulatory Visit: Payer: Self-pay | Admitting: Urology

## 2021-10-05 ENCOUNTER — Other Ambulatory Visit: Payer: Self-pay | Admitting: Family Medicine

## 2021-10-05 DIAGNOSIS — N138 Other obstructive and reflux uropathy: Secondary | ICD-10-CM

## 2021-10-19 DIAGNOSIS — H2513 Age-related nuclear cataract, bilateral: Secondary | ICD-10-CM | POA: Diagnosis not present

## 2021-10-19 DIAGNOSIS — H401131 Primary open-angle glaucoma, bilateral, mild stage: Secondary | ICD-10-CM | POA: Diagnosis not present

## 2021-11-18 ENCOUNTER — Other Ambulatory Visit: Payer: Self-pay

## 2021-11-18 ENCOUNTER — Ambulatory Visit (INDEPENDENT_AMBULATORY_CARE_PROVIDER_SITE_OTHER): Payer: Medicare Other | Admitting: Dermatology

## 2021-11-18 DIAGNOSIS — Z85828 Personal history of other malignant neoplasm of skin: Secondary | ICD-10-CM

## 2021-11-18 DIAGNOSIS — L814 Other melanin hyperpigmentation: Secondary | ICD-10-CM | POA: Diagnosis not present

## 2021-11-18 DIAGNOSIS — D229 Melanocytic nevi, unspecified: Secondary | ICD-10-CM

## 2021-11-18 DIAGNOSIS — L570 Actinic keratosis: Secondary | ICD-10-CM

## 2021-11-18 DIAGNOSIS — D1800 Hemangioma unspecified site: Secondary | ICD-10-CM | POA: Diagnosis not present

## 2021-11-18 DIAGNOSIS — L578 Other skin changes due to chronic exposure to nonionizing radiation: Secondary | ICD-10-CM | POA: Diagnosis not present

## 2021-11-18 DIAGNOSIS — L821 Other seborrheic keratosis: Secondary | ICD-10-CM | POA: Diagnosis not present

## 2021-11-18 DIAGNOSIS — Z1283 Encounter for screening for malignant neoplasm of skin: Secondary | ICD-10-CM

## 2021-11-18 MED ORDER — FLUOROURACIL 5 % EX CREA
TOPICAL_CREAM | CUTANEOUS | 0 refills | Status: DC
Start: 2021-11-18 — End: 2023-02-13

## 2021-11-18 NOTE — Progress Notes (Signed)
Follow-Up Visit   Subjective  Brett Bender is a 76 y.o. male who presents for the following: Follow-up (Pt here for an upper body exam today. Pt has hx of BCC and AKs. Few spots to check today. ). The patient presents for Upper Body Skin Exam (UBSE) for skin cancer screening and mole check.  The patient has spots, moles and lesions to be evaluated, some may be new or changing and the patient has concerns that these could be cancer.  The following portions of the chart were reviewed this encounter and updated as appropriate:  Tobacco   Allergies   Meds   Problems   Med Hx   Surg Hx   Fam Hx      Review of Systems: No other skin or systemic complaints except as noted in HPI or Assessment and Plan.  Objective  Well appearing patient in no apparent distress; mood and affect are within normal limits.  All skin waist up examined.  face, chest, arms x 40 (40) Erythematous thin papules/macules with gritty scale.    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 They are chronic with expected duration over 1 year. A portion of actinic keratoses will progress to squamous cell carcinoma of the skin. It is not possible to reliably predict which spots will progress to skin cancer and so treatment is recommended to prevent development of skin cancer.  Recommend daily broad spectrum sunscreen SPF 30+ to sun-exposed areas, reapply every 2 hours as needed.  Recommend staying in the shade or wearing long sleeves, sun glasses (UVA+UVB protection) and wide brim hats (4-inch brim around the entire circumference of the hat). Call for new or changing lesions.  Prior to procedure, discussed risks of blister formation, small wound, skin dyspigmentation, or rare scar following cryotherapy. Recommend Vaseline ointment to treated areas while  healing.   Destruction of lesion - face, chest, arms x 40 Complexity: simple   Destruction method: cryotherapy   Informed consent: discussed and consent obtained   Timeout:  patient name, date of birth, surgical site, and procedure verified Lesion destroyed using liquid nitrogen: Yes   Region frozen until ice ball extended beyond lesion: Yes   Outcome: patient tolerated procedure well with no complications   Post-procedure details: wound care instructions given    fluorouracil (EFUDEX) 5 % cream - face, chest, arms x 40 Start on 12/18/21. Apply twice a day to temples and cheeks x 4 days.  Skin cancer screening  Lentigines - Scattered tan macules - Due to sun exposure - Benign-appearing, observe - Recommend daily broad spectrum sunscreen SPF 30+ to sun-exposed areas, reapply every 2 hours as needed. - Call for any changes  Seborrheic Keratoses - Stuck-on, waxy, tan-brown papules and/or plaques  - Benign-appearing - Discussed benign etiology and prognosis. - Observe - Call for any changes  Melanocytic Nevi - Tan-brown and/or pink-flesh-colored symmetric macules and papules - Benign appearing on exam today - Observation - Call clinic for new or changing moles - Recommend daily use of broad spectrum spf 30+ sunscreen to sun-exposed areas.   Hemangiomas - Red papules - Discussed benign nature - Observe - Call for any changes  Actinic Damage - Severe, confluent actinic changes with pre-cancerous actinic keratoses  - Severe, chronic, not at goal, secondary to cumulative UV radiation exposure over time - diffuse scaly erythematous macules and papules  with underlying dyspigmentation - Discussed Prescription "Field Treatment" for Severe, Chronic Confluent Actinic Changes with Pre-Cancerous Actinic Keratoses Field treatment involves treatment of an entire area of skin that has confluent Actinic Changes (Sun/ Ultraviolet light damage) and PreCancerous Actinic Keratoses by method  of PhotoDynamic Therapy (PDT) and/or prescription Topical Chemotherapy agents such as 5-fluorouracil, 5-fluorouracil/calcipotriene, and/or imiquimod.  The purpose is to decrease the number of clinically evident and subclinical PreCancerous lesions to prevent progression to development of skin cancer by chemically destroying early precancer changes that may or may not be visible.  It has been shown to reduce the risk of developing skin cancer in the treated area. As a result of treatment, redness, scaling, crusting, and open sores may occur during treatment course. One or more than one of these methods may be used and may have to be used several times to control, suppress and eliminate the PreCancerous changes. Discussed treatment course, expected reaction, and possible side effects. - Recommend daily broad spectrum sunscreen SPF 30+ to sun-exposed areas, reapply every 2 hours as needed.  - Staying in the shade or wearing long sleeves, sun glasses (UVA+UVB protection) and wide brim hats (4-inch brim around the entire circumference of the hat) are also recommended. - Call for new or changing lesions. - Starting 12/18/21 - Apply 5Fu/Calcipotriene cream to temples and cheeks BID x 4 days.  Skin cancer screening performed today.  History of Basal Cell Carcinoma of the Skin - No evidence of recurrence today - Recommend regular full body skin exams - Recommend daily broad spectrum sunscreen SPF 30+ to sun-exposed areas, reapply every 2 hours as needed.  - Call if any new or changing lesions are noted between office visits  Return in about 2 months (around 01/16/2022) for AK f/u.  IHarriett Sine, CMA, am acting as scribe for Sarina Ser, MD. Documentation: I have reviewed the above documentation for accuracy and completeness, and I agree with the above.  Sarina Ser, MD

## 2021-11-18 NOTE — Patient Instructions (Addendum)
Cryotherapy Aftercare  Wash gently with soap and water everyday.   Apply Vaseline and Band-Aid daily until healed.     Starting 12/18/21 - Apply fluorouracil/calcipotriene cream to temples and cheeks twice a day x 4 days.      If You Need Anything After Your Visit  If you have any questions or concerns for your doctor, please call our main line at [phone] and press option 4 to reach your doctor's medical assistant. If no one answers, please leave a voicemail as directed and we will return your call as soon as possible. Messages left after 4 pm will be answered the following business day.   You may also send [place] a message via [place]. We typically respond to MyChart messages within 1-2 business days.  For prescription refills, please ask your pharmacy to contact our office. Our fax number is [phone].  If you have an urgent issue when the clinic is closed that cannot wait until the next business day, you can page your doctor at the number below.    Please note that while we do our best to be available for urgent issues outside of office hours, we are not available 24/7.   If you have an urgent issue and are unable to reach [place], you may choose to seek medical care at your doctor's office, retail clinic, urgent care center, or emergency room.  If you have a medical emergency, please immediately call 911 or go to the emergency department.  Pager Numbers  - Dr. Nehemiah Massed: [phone]  - Dr. Laurence Ferrari: [phone]  - Dr. Nicole Kindred: [phone]  In the event of inclement weather, please call our main line at [phone] for an update on the status of any delays or closures.  Dermatology Medication Tips: Please keep the boxes that topical medications come in in order to help keep track of the instructions about where and how to use these. Pharmacies typically print the medication instructions only on the boxes and not directly on the medication tubes.   If your medication is too  expensive, please contact our office at [phone] option 4 or send [place] a message through [place].   We are unable to tell what your co-pay for medications will be in advance as this is different depending on your insurance coverage. However, we may be able to find a substitute medication at lower cost or fill out paperwork to get insurance to cover a needed medication.   If a prior authorization is required to get your medication covered by your insurance company, please allow [place] 1-2 business days to complete this process.  Drug prices often vary depending on where the prescription is filled and some pharmacies may offer cheaper prices.  The website www.goodrx.com contains coupons for medications through different pharmacies. The prices here do not account for what the cost may be with help from insurance (it may be cheaper with your insurance), but the website can give you the price if you did not use any insurance.  - You can print the associated coupon and take it with your prescription to the pharmacy.  - You may also stop by our office during regular business hours and pick up a GoodRx coupon card.  - If you need your prescription sent electronically to a different pharmacy, notify our office through [facility] or by phone at [phone] option 4.     Si Usted Necesita Algo Despus de Su Visita  Tambin puede enviarnos un mensaje a travs de Pharmacist, community. Por lo  general respondemos a los mensajes de MyChart en el transcurso de 1 a 2 das hbiles.  Para renovar recetas, por favor pida a su farmacia que se ponga en contacto con nuestra oficina. Harland Dingwall de fax es [place] [phone].  Si tiene un asunto urgente cuando la clnica est cerrada y que no puede esperar hasta el siguiente da hbil, puede llamar/localizar a su doctor(a) al nmero que aparece a continuacin.   Por favor, tenga en cuenta que aunque hacemos todo lo posible para estar disponibles para asuntos urgentes fuera  del horario de [place], no estamos disponibles las 24 horas del da, los 7 das de la [place].   Si tiene un problema urgente y no puede comunicarse con nosotros, puede optar por buscar atencin mdica  en el consultorio de su doctor(a), en una clnica privada, en un centro de atencin urgente o en una sala de emergencias.  Si tiene Engineering geologist, por favor llame inmediatamente al 911 o vaya a la sala de emergencias.  Nmeros de bper  - Dr. Nehemiah Massed: [phone]  - Dra. Moye: [phone]  - Dra. Nicole Kindred: [phone]  En caso de inclemencias del [place], por favor llame a Johnsie Kindred principal al [phone] para una actualizacin sobre el [place] de cualquier retraso o cierre.  Consejos para la medicacin en dermatologa: Por favor, guarde las cajas en las que vienen los medicamentos de uso tpico para ayudarle a seguir las instrucciones sobre dnde y cmo usarlos. Las farmacias generalmente imprimen las instrucciones del medicamento slo en las cajas y no directamente en los tubos del [place].   Si su medicamento es muy caro, por favor, pngase en contacto con Zigmund Daniel llamando al [phone] y presione la opcin 4 o envenos un mensaje a travs de Pharmacist, community.   No podemos decirle cul ser su copago por los medicamentos por adelantado ya que esto es diferente dependiendo de la cobertura de su seguro. Sin embargo, es posible que podamos encontrar un medicamento sustituto a Electrical engineer un formulario para que el seguro cubra el medicamento que se considera necesario.   Si se requiere una autorizacin previa para que su compaa de seguros [place] su medicamento, por favor permtanos de 1 a 2 das hbiles para completar este proceso.  Los precios de los medicamentos varan con frecuencia dependiendo del Environmental consultant de dnde se surte la receta y alguna farmacias pueden ofrecer precios ms baratos.  El sitio web www.goodrx.com tiene cupones para medicamentos de Office manager. Los precios aqu no tienen en cuenta lo que podra costar con la ayuda del seguro (puede ser ms barato con su seguro), pero el sitio web puede darle el precio si no utiliz Research scientist (physical sciences).  - Puede imprimir el cupn correspondiente y llevarlo con su receta a la farmacia.  - Tambin puede pasar por nuestra oficina durante el horario de atencin regular y Charity fundraiser una tarjeta de cupones de GoodRx.  - Si necesita que su receta se enve electrnicamente a una farmacia diferente, informe a nuestra oficina a travs de MyChart de [place] o por telfono llamando al [phone] y presione la opcin 4.

## 2021-11-20 ENCOUNTER — Encounter: Payer: Self-pay | Admitting: Dermatology

## 2021-12-31 ENCOUNTER — Ambulatory Visit (INDEPENDENT_AMBULATORY_CARE_PROVIDER_SITE_OTHER): Payer: Medicare Other

## 2021-12-31 ENCOUNTER — Other Ambulatory Visit: Payer: Self-pay

## 2021-12-31 VITALS — BP 154/86 | HR 59 | Temp 97.2°F | Ht 75.5 in | Wt 213.0 lb

## 2021-12-31 DIAGNOSIS — Z0000 Encounter for general adult medical examination without abnormal findings: Secondary | ICD-10-CM

## 2021-12-31 NOTE — Patient Instructions (Signed)
Brett Bender , Thank you for taking time to come for your Medicare Wellness Visit. I appreciate your ongoing commitment to your health goals. Please review the following plan we discussed and let me know if I can assist you in the future.   Screening recommendations/referrals: Colonoscopy: 11/13/12 Recommended yearly ophthalmology/optometry visit for glaucoma screening and checkup Recommended yearly dental visit for hygiene and checkup  Vaccinations: Influenza vaccine: 09/09/21 Pneumococcal vaccine: 11/27/14 Tdap vaccine: 12/22/11, due Shingles vaccine: Shingrix 05/25/17, 07/28/17  Zostavax  11/26/11   Covid-19: 12/1719, 01/16/20, 08/14/20  Advanced directives: no  Conditions/risks identified: none  Next appointment: Follow up in one year for your annual wellness visit.-  01/04/23 @ 1pm in person  Preventive Care 65 Years and Older, Male Preventive care refers to lifestyle choices and visits with your health care provider that can promote health and wellness. What does preventive care include? A yearly physical exam. This is also called an annual well check. Dental exams once or twice a year. Routine eye exams. Ask your health care provider how often you should have your eyes checked. Personal lifestyle choices, including: Daily care of your teeth and gums. Regular physical activity. Eating a healthy diet. Avoiding tobacco and drug use. Limiting alcohol use. Practicing safe sex. Taking low doses of aspirin every day. Taking vitamin and mineral supplements as recommended by your health care provider. What happens during an annual well check? The services and screenings done by your health care provider during your annual well check will depend on your age, overall health, lifestyle risk factors, and family history of disease. Counseling  Your health care provider may ask you questions about your: Alcohol use. Tobacco use. Drug use. Emotional well-being. Home and relationship  well-being. Sexual activity. Eating habits. History of falls. Memory and ability to understand (cognition). Work and work Statistician. Screening  You may have the following tests or measurements: Height, weight, and BMI. Blood pressure. Lipid and cholesterol levels. These may be checked every 5 years, or more frequently if you are over 49 years old. Skin check. Lung cancer screening. You may have this screening every year starting at age 26 if you have a 30-pack-year history of smoking and currently smoke or have quit within the past 15 years. Fecal occult blood test (FOBT) of the stool. You may have this test every year starting at age 78. Flexible sigmoidoscopy or colonoscopy. You may have a sigmoidoscopy every 5 years or a colonoscopy every 10 years starting at age 40. Prostate cancer screening. Recommendations will vary depending on your family history and other risks. Hepatitis C blood test. Hepatitis B blood test. Sexually transmitted disease (STD) testing. Diabetes screening. This is done by checking your blood sugar (glucose) after you have not eaten for a while (fasting). You may have this done every 1-3 years. Abdominal aortic aneurysm (AAA) screening. You may need this if you are a current or former smoker. Osteoporosis. You may be screened starting at age 18 if you are at high risk. Talk with your health care provider about your test results, treatment options, and if necessary, the need for more tests. Vaccines  Your health care provider may recommend certain vaccines, such as: Influenza vaccine. This is recommended every year. Tetanus, diphtheria, and acellular pertussis (Tdap, Td) vaccine. You may need a Td booster every 10 years. Zoster vaccine. You may need this after age 6. Pneumococcal 13-valent conjugate (PCV13) vaccine. One dose is recommended after age 74. Pneumococcal polysaccharide (PPSV23) vaccine. One dose is recommended after  age 30. Talk to your health care  provider about which screenings and vaccines you need and how often you need them. This information is not intended to replace advice given to you by your health care provider. Make sure you discuss any questions you have with your health care provider. Document Released: 11/21/2015 Document Revised: 07/14/2016 Document Reviewed: 08/26/2015 Elsevier Interactive Patient Education  2017 [place] Prevention in the Home Falls can cause injuries. They can happen to people of all ages. There are many things you can do to make your home safe and to help prevent falls. What can I do on the outside of my home? Regularly fix the edges of walkways and driveways and fix any cracks. Remove anything that might make you trip as you walk through a door, such as a raised step or threshold. Trim any bushes or trees on the path to your home. Use bright outdoor lighting. Clear any walking paths of anything that might make someone trip, such as rocks or tools. Regularly check to see if handrails are loose or broken. Make sure that both sides of any steps have handrails. Any raised decks and porches should have guardrails on the edges. Have any leaves, snow, or ice cleared regularly. Use sand or salt on walking paths during winter. Clean up any spills in your garage right away. This includes oil or grease spills. What can I do in the bathroom? Use night lights. Install grab bars by the toilet and in the tub and shower. Do not use towel bars as grab bars. Use non-skid mats or decals in the tub or shower. If you need to sit down in the shower, use a plastic, non-slip stool. Keep the floor dry. Clean up any water that spills on the floor as soon as it happens. Remove soap buildup in the tub or shower regularly. Attach bath mats securely with double-sided non-slip rug tape. Do not have throw rugs and other things on the floor that can make you trip. What can I do in the bedroom? Use night lights. Make  sure that you have a light by your bed that is easy to reach. Do not use any sheets or blankets that are too big for your bed. They should not hang down onto the floor. Have a firm chair that has side arms. You can use this for support while you get dressed. Do not have throw rugs and other things on the floor that can make you trip. What can I do in the kitchen? Clean up any spills right away. Avoid walking on wet floors. Keep items that you use a lot in easy-to-reach places. If you need to reach something above you, use a strong step stool that has a grab bar. Keep electrical cords out of the way. Do not use floor polish or wax that makes floors slippery. If you must use wax, use non-skid floor wax. Do not have throw rugs and other things on the floor that can make you trip. What can I do with my stairs? Do not leave any items on the stairs. Make sure that there are handrails on both sides of the stairs and use them. Fix handrails that are broken or loose. Make sure that handrails are as long as the stairways. Check any carpeting to make sure that it is firmly attached to the stairs. Fix any carpet that is loose or worn. Avoid having throw rugs at the top or bottom of the stairs. If you do  have throw rugs, attach them to the floor with carpet tape. Make sure that you have a light switch at the top of the stairs and the bottom of the stairs. If you do not have them, ask someone to add them for you. What else can I do to help prevent falls? Wear shoes that: Do not have high heels. Have rubber bottoms. Are comfortable and fit you well. Are closed at the toe. Do not wear sandals. If you use a stepladder: Make sure that it is fully opened. Do not climb a closed stepladder. Make sure that both sides of the stepladder are locked into place. Ask someone to hold it for you, if possible. Clearly mark and make sure that you can see: Any grab bars or handrails. First and last steps. Where the  edge of each step is. Use tools that help you move around (mobility aids) if they are needed. These include: Canes. Walkers. Scooters. Crutches. Turn on the lights when you go into a dark area. Replace any light bulbs as soon as they burn out. Set up your furniture so you have a clear path. Avoid moving your furniture around. If any of your floors are uneven, fix them. If there are any pets around you, be aware of where they are. Review your medicines with your doctor. Some medicines can make you feel dizzy. This can increase your chance of falling. Ask your doctor what other things that you can do to help prevent falls. This information is not intended to replace advice given to you by your health care provider. Make sure you discuss any questions you have with your health care provider. Document Released: 08/21/2009 Document Revised: 04/01/2016 Document Reviewed: 11/29/2014 Elsevier Interactive Patient Education  2017 [employer].

## 2021-12-31 NOTE — Progress Notes (Signed)
Subjective:   Brett Bender is a 76 y.o. male who presents for Medicare Annual/Subsequent preventive examination.  Review of Systems           Objective:    Today's Vitals   12/31/21 1306  BP: (!) 154/86  Pulse: (!) 59  Temp: (!) 97.2 F (36.2 C)  TempSrc: Oral  SpO2: 95%  Weight: 213 lb (96.6 kg)  Height: 6' 3.5" (1.918 m)   Body mass index is 26.27 kg/m.  Advanced Directives 12/29/2020 01/06/2020 01/03/2020 01/03/2020 12/27/2019 12/04/2019 07/20/2019  Does Patient Have a Medical Advance Directive? Yes Yes Yes Yes Yes Yes No  Type of Paramedic of [place];Living will Living will Living will Living will [place];Living will Living will -  Does patient want to make changes to medical advance directive? - No - Patient declined - - - No - Patient declined -  Copy of [place] in Chart? Yes - validated most recent copy scanned in chart (See row information) - Yes - validated most recent copy scanned in chart (See row information) - Yes - validated most recent copy scanned in chart (See row information) - -  Would patient like information on creating a medical advance directive? - - No - Patient declined No - Patient declined - - -    Current Medications (verified) Outpatient Encounter Medications as of 12/31/2021  Medication Sig   ALPRAZolam (XANAX) 0.5 MG tablet Take 0.5 mg by mouth at bedtime as needed for anxiety.   amLODipine (NORVASC) 10 MG tablet Take 1 tablet (10 mg total) by mouth daily.   aspirin 81 MG chewable tablet Chew by mouth daily.   atenolol-chlorthalidone (TENORETIC) 50-25 MG tablet TAKE 1/2 TABLET BY MOUTH ONCE DAILY   butalbital-acetaminophen-caffeine (BAC) 50-325-40 MG tablet TAKE 1 OR 2 TABLETS BY MOUTH EVERY 6 HOURS AS NEEDED FOR HEADACHE   diclofenac (VOLTAREN) 50 MG EC tablet Take 1 tablet (50 mg total) by mouth 2 (two) times daily as needed.   diclofenac Sodium (VOLTAREN) 1 % GEL APPLY 4G  TOPICALLY FIVE TIMES DAILY   fexofenadine (ALLEGRA ALLERGY) 180 MG tablet Take 1 tablet (180 mg total) by mouth daily.   finasteride (PROSCAR) 5 MG tablet TAKE 1 TABLET BY MOUTH ONCE DAILY   fluorouracil (EFUDEX) 5 % cream Start on 12/18/21. Apply twice a day to temples and cheeks x 4 days.   latanoprost (XALATAN) 0.005 % ophthalmic solution Place 1 drop into both eyes at bedtime.    mirtazapine (REMERON) 30 MG tablet TAKE 1 TABLET BY MOUTH EVERY NIGHT AT BEDTIME   mometasone (ELOCON) 0.1 % cream Apply twice a day as needed to affected areas with rash   Multiple Vitamin (MULTIVITAMIN) tablet Take 1 tablet by mouth daily.   mupirocin ointment (BACTROBAN) 2 % Apply 1 application topically 2 (two) times daily.   Probiotic Product (DIGESTIVE ADVANTAGE PO) Take 1 tablet by mouth daily.   psyllium (METAMUCIL) 58.6 % powder Take 1 packet by mouth daily as needed (regularity).    tamsulosin (FLOMAX) 0.4 MG CAPS capsule TAKE 1 CAPSULE BY MOUTH ONCE DAILY   timolol (TIMOPTIC) 0.5 % ophthalmic solution Place 1 drop into both eyes daily.    No facility-administered encounter medications on file as of 12/31/2021.    Allergies (verified) Penicillins   History: Past Medical History:  Diagnosis Date   Actinic keratosis    Arthritis    Basal cell carcinoma 11/09/2013   R lateral edge of  clavicle   Basal cell carcinoma 02/03/2016   L lateral supraclavicular base of neck/excision   Basal cell carcinoma 11/29/2018   L upper back   Basal cell carcinoma 09/11/2020   R mid back    Cancer ([place])    Basal cell carcinoma bilateral arms and left shoulder   Complication of anesthesia    slow to wake up with shoulder surgery   Depression    Enlarged prostate    H/O elbow surgery    left   Herpes simplex    History of kidney stones    History of MRSA infection 2004   skin around eye   Hypertension    Kidney stone    Lumbar stenosis    Migraine headache    Motion sickness    ocean boats   PONV  (postoperative nausea and vomiting)    Tendonitis of ankle, left    Past Surgical History:  Procedure Laterality Date   APPENDECTOMY     back injection     BACK SURGERY     CYSTOSCOPY/URETEROSCOPY/HOLMIUM LASER/STENT PLACEMENT Left 12/18/2015   Procedure: CYSTOSCOPY/URETEROSCOPY/HOLMIUM LASER LITHOTRIPSY;  Surgeon: Hollice Espy, MD;  Location: [facility];  Service: Urology;  Laterality: Left;   ELBOW SURGERY     LOWER BACK SURGERY  2010   L4-5 fusion   LUMBAR LAMINECTOMY/DECOMPRESSION MICRODISCECTOMY N/A 08/21/2018   Procedure: LUMBAR LAMINECTOMY/DECOMPRESSION MICRODISCECTOMY 1 LEVEL-L3/4;  Surgeon: Deetta Perla, MD;  Location: [facility];  Service: Neurosurgery;  Laterality: N/A;   NECK SURGERY     Upper neck surgery; with wires placed   SHOULDER SURGERY Right 11/10/2011   arthroscopic surgery . Dr. Tamala Julian, [place] Left 12/04/2019   Procedure: TRICEPS TENDON REPAIR, OLECRANON BURSA;  Surgeon: Leim Fabry, MD;  Location: [facility];  Service: Orthopedics;  Laterality: Left;   Family History  Problem Relation Age of Onset   Depression Son    Diabetes Neg Hx    Cancer Neg Hx    Heart attack Neg Hx    Kidney disease Neg Hx    Prostate cancer Neg Hx    Kidney cancer Neg Hx    Bladder Cancer Neg Hx    Social History   Socioeconomic History   Marital status: Married    Spouse name: Not on file   Number of children: 2   Years of education: Not on file   Highest education level: Associate degree: occupational, Hotel manager, or vocational program  Occupational History   Occupation: Retired    Comment: former Investment banker, corporate  Tobacco Use   Smoking status: Never   Smokeless tobacco: Never  Scientific laboratory technician Use: Never used  Substance and Sexual Activity   Alcohol use: No    Alcohol/week: 0.0 standard drinks   Drug use: No   Sexual activity: Yes  Other Topics Concern   Not on file  Social History Narrative   Not on file    Social Determinants of Health   Financial Resource Strain: Not on file  Food Insecurity: Not on file  Transportation Needs: Not on file  Physical Activity: Not on file  Stress: Not on file  Social Connections: Not on file    Tobacco Counseling Counseling given: Not Answered   Clinical Intake:  Pre-visit preparation completed: Yes  Pain : No/denies pain     Nutritional Risks: None Diabetes: No  How often do you need to have someone help you when you read  instructions, pamphlets, or other written materials from your doctor or pharmacy?: 1 - Never  Diabetic?no  Interpreter Needed?: No  Information entered by :: Kirke Shaggy, LPN   Activities of Daily Living No flowsheet data found.  Patient Care Team: Birdie Sons, MD as PCP - General (Family Medicine) Laneta Simmers as Physician Assistant (Urology) Odette Fraction as Consulting Physician (Optometry) Ralene Bathe, MD (Dermatology)  Indicate any recent Medical Services you may have received from other than [facility] providers in the past year (date may be approximate).     Assessment:   This is a routine wellness examination for Tyler.  Hearing/Vision screen No results found.  Dietary issues and exercise activities discussed:     Goals Addressed   None    Depression Screen PHQ 2/9 Scores 05/28/2021 04/28/2021 06/30/2020 01/01/2020 12/27/2019 12/25/2018 12/20/2017  PHQ - 2 Score 0 0 0 1 0 0 0  PHQ- 9 Score - 0 0 1 - - -    Fall Risk Fall Risk  05/28/2021 04/28/2021 12/29/2020 01/01/2020 12/27/2019  Falls in the past year? 0 0 0 0 0  Number falls in past yr: 0 0 0 0 0  Injury with Fall? 0 0 0 0 0  Risk for fall due to : No Fall Risks No Fall Risks - - -  Follow up Falls evaluation completed Falls evaluation completed - - -    FALL RISK PREVENTION PERTAINING TO THE HOME:  Any stairs in or around the home? Yes  If so, are there any without handrails? No  Home free of loose throw rugs in  walkways, pet beds, electrical cords, etc? Yes  Adequate lighting in your home to reduce risk of falls? Yes   ASSISTIVE DEVICES UTILIZED TO PREVENT FALLS:  Life alert? No  Use of a cane, walker or w/c? No  Grab bars in the bathroom? Yes  Shower chair or bench in shower? Yes  Elevated toilet seat or a handicapped toilet? Yes   TIMED UP AND GO:  Was the test performed? Yes .  Length of time to ambulate 10 feet: 4 sec.   Gait steady and fast without use of assistive device  Cognitive Function:     6CIT Screen 12/17/2016  What Year? 0 points  What month? 0 points  What time? 0 points  Count back from 20 0 points  Months in reverse 0 points  Repeat phrase 0 points  Total Score 0    Immunizations Immunization History  Administered Date(s) Administered   Fluad Quad(high Dose 65+) 08/03/2019, 08/26/2020, 09/09/2021   Influenza Split 09/03/2006, 08/12/2007, 07/26/2008, 08/09/2012   Influenza, High Dose Seasonal PF 08/10/2014, 08/18/2015, 08/10/2016, 07/28/2017, 08/16/2018   Influenza,inj,Quad PF,6+ Mos 07/31/2013   PFIZER(Purple Top)SARS-COV-2 Vaccination 12/26/2019, 01/16/2020, 08/14/2020   Pneumococcal Conjugate-13 11/27/2014   Pneumococcal Polysaccharide-23 12/22/2011   Tdap 12/22/2011   Zoster Recombinat (Shingrix) 05/25/2017, 07/28/2017   Zoster, Live 11/26/2011    TDAP status: Due, Education has been provided regarding the importance of this vaccine. Advised may receive this vaccine at local pharmacy or Health Dept. Aware to provide a copy of the vaccination record if obtained from local pharmacy or Health Dept. Verbalized acceptance and understanding.  Flu Vaccine status: Up to date  Pneumococcal vaccine status: Up to date  Covid-19 vaccine status: Completed vaccines  Qualifies for Shingles Vaccine? Yes   Zostavax completed Yes   Shingrix Completed?: Yes  Screening Tests Health Maintenance  Topic Date Due   COVID-19 Vaccine (  4 - Booster for [employer] series)  10/09/2020   TETANUS/TDAP  12/21/2021   COLONOSCOPY (Pts 45-67yrs Insurance coverage will need to be confirmed)  11/13/2022   Pneumonia Vaccine 43+ Years old  Completed   INFLUENZA VACCINE  Completed   Hepatitis C Screening  Completed   Zoster Vaccines- Shingrix  Completed   HPV VACCINES  Aged Out    Health Maintenance  Health Maintenance Due  Topic Date Due   COVID-19 Vaccine (4 - Booster for Pf[employer] series) 10/09/2020   TETANUS/TDAP  12/21/2021    Colorectal cancer screening: Type of screening: Colonoscopy. Completed 11/13/12. Repeat every 10 years  Lung Cancer Screening: (Low Dose CT Chest recommended if Age 70-80 years, 30 pack-year currently smoking OR have quit w/in 15years.) does not qualify.    Additional Screening:  Hepatitis C Screening: does qualify; Completed 10/22/13  Vision Screening: Recommended annual ophthalmology exams for early detection of glaucoma and other disorders of the eye. Is the patient up to date with their annual eye exam?  Yes  Who is the provider or what is the name of the office in which the patient attends annual eye exams? Dr.Thurmond If pt is not established with a provider, would they like to be referred to a provider to establish care? No .   Dental Screening: Recommended annual dental exams for proper oral hygiene  Community Resource Referral / Chronic Care Management: CRR required this visit?  No   CCM required this visit?  No      Plan:     I have personally reviewed and noted the following in the patients chart:   Medical and social history Use of alcohol, tobacco or illicit drugs  Current medications and supplements including opioid prescriptions. Patient is not currently taking opioid prescriptions. Functional ability and status Nutritional status Physical activity Advanced directives List of other physicians Hospitalizations, surgeries, and ER visits in previous 12 months Vitals Screenings to include cognitive,  depression, and falls Referrals and appointments  In addition, I have reviewed and discussed with patient certain preventive protocols, quality metrics, and best practice recommendations. A written personalized care plan for preventive services as well as general preventive health recommendations were provided to patient.     Dionisio David, LPN   01/27/253   Nurse Notes: none

## 2022-01-01 ENCOUNTER — Encounter: Payer: Medicare Other | Admitting: Family Medicine

## 2022-01-06 ENCOUNTER — Other Ambulatory Visit: Payer: Self-pay | Admitting: Family Medicine

## 2022-01-11 ENCOUNTER — Ambulatory Visit (INDEPENDENT_AMBULATORY_CARE_PROVIDER_SITE_OTHER): Payer: Medicare Other | Admitting: Family Medicine

## 2022-01-11 ENCOUNTER — Other Ambulatory Visit: Payer: Self-pay

## 2022-01-11 ENCOUNTER — Encounter: Payer: Self-pay | Admitting: Family Medicine

## 2022-01-11 VITALS — BP 125/77 | HR 61 | Temp 97.7°F | Resp 16 | Ht 75.5 in | Wt 211.0 lb

## 2022-01-11 DIAGNOSIS — Z125 Encounter for screening for malignant neoplasm of prostate: Secondary | ICD-10-CM | POA: Diagnosis not present

## 2022-01-11 DIAGNOSIS — I10 Essential (primary) hypertension: Secondary | ICD-10-CM | POA: Diagnosis not present

## 2022-01-11 DIAGNOSIS — J301 Allergic rhinitis due to pollen: Secondary | ICD-10-CM

## 2022-01-11 NOTE — Progress Notes (Signed)
?  ? ? ?Established patient visit ? ? ?Patient: Brett Bender   DOB: May 29, 1946   76 y.o. Male  MRN: [id number] ?Visit Date: 01/11/2022 ? ?Today's healthcare provider: Lelon Huh, MD  ? ?Chief Complaint  ?Patient presents with  ? follow-up HTN  ? ?Subjective  ?  ?HPI  ?AWV done with [facility] 12/31/2021 ? ?Hypertension, follow-up ? ?BP Readings from Last 3 Encounters:  ?01/11/22 125/77  ?12/31/21 (!) 154/86  ?08/07/21 130/82  ? Wt Readings from Last 3 Encounters:  ?01/11/22 211 lb (95.7 kg)  ?12/31/21 213 lb (96.6 kg)  ?08/07/21 210 lb (95.3 kg)  ?  ? ?He was last seen for hypertension 6 months ago.  ?BP at that visit was 133/86. Management since that visit includes no changes, Continue current medications.  Continue regular exercise.. ? ?He reports excellent compliance with treatment. ?He is not having side effects.  ?He is following a Regular diet. ?He is exercising. ?He does not smoke. ? ? ?Outside blood pressures are not being checked. ?Symptoms: ?No chest pain No chest pressure  ?No palpitations No syncope  ?No dyspnea No orthopnea  ?No paroxysmal nocturnal dyspnea No lower extremity edema  ? ?Pertinent labs: ?Lab Results  ?Component Value Date  ? CHOL 173 12/31/2020  ? HDL 44 12/31/2020  ? LDLCALC 107 (H) 12/31/2020  ? TRIG 123 12/31/2020  ? CHOLHDL 3.9 12/31/2020  ? Lab Results  ?Component Value Date  ? NA 137 12/31/2020  ? K 4.9 12/31/2020  ? CREATININE 1.22 12/31/2020  ? GFRNONAA 58 (L) 12/31/2020  ? GLUCOSE 107 (H) 12/31/2020  ? TSH 1.02 02/27/2012  ?  ? ?The 10-year ASCVD risk score (Arnett DK, et al., 2019) is: 27.2%  ? ?---------------------------------------------------------------------------------------------------  ? ?Medications: ?Outpatient Medications Prior to Visit  ?Medication Sig  ? ALPRAZolam (XANAX) 0.5 MG tablet Take 0.5 mg by mouth at bedtime as needed for anxiety.  ? amLODipine (NORVASC) 10 MG tablet Take 1 tablet (10 mg total) by mouth daily.  ? aspirin 81 MG chewable tablet Chew by mouth  daily.  ? atenolol-chlorthalidone (TENORETIC) 50-25 MG tablet TAKE 1/2 TABLET BY MOUTH ONCE DAILY  ? butalbital-acetaminophen-caffeine (BAC) 50-325-40 MG tablet TAKE 1 OR 2 TABLETS BY MOUTH EVERY 6 HOURS AS NEEDED FOR HEADACHE  ? diclofenac (VOLTAREN) 50 MG EC tablet Take 1 tablet (50 mg total) by mouth 2 (two) times daily as needed.  ? diclofenac Sodium (VOLTAREN) 1 % GEL APPLY 4G TOPICALLY FIVE TIMES DAILY  ? fexofenadine (ALLEGRA ALLERGY) 180 MG tablet Take 1 tablet (180 mg total) by mouth daily.  ? finasteride (PROSCAR) 5 MG tablet TAKE 1 TABLET BY MOUTH ONCE DAILY  ? fluorouracil (EFUDEX) 5 % cream Start on 12/18/21. Apply twice a day to temples and cheeks x 4 days.  ? latanoprost (XALATAN) 0.005 % ophthalmic solution Place 1 drop into both eyes at bedtime.   ? mirtazapine (REMERON) 30 MG tablet TAKE 1 TABLET BY MOUTH EVERY NIGHT AT BEDTIME  ? mometasone (ELOCON) 0.1 % cream Apply twice a day as needed to affected areas with rash  ? Multiple Vitamin (MULTIVITAMIN) tablet Take 1 tablet by mouth daily.  ? mupirocin ointment (BACTROBAN) 2 % Apply 1 application topically 2 (two) times daily.  ? Probiotic Product (DIGESTIVE ADVANTAGE PO) Take 1 tablet by mouth daily.  ? psyllium (METAMUCIL) 58.6 % powder Take 1 packet by mouth daily as needed (regularity).   ? tamsulosin (FLOMAX) 0.4 MG CAPS capsule TAKE 1 CAPSULE BY MOUTH ONCE DAILY  ?  timolol (TIMOPTIC) 0.5 % ophthalmic solution Place 1 drop into both eyes daily.   ? ?No facility-administered medications prior to visit.  ? ? ?Review of Systems  ?Constitutional:  Negative for fatigue.  ?Respiratory:  Negative for chest tightness and shortness of breath.   ?Cardiovascular:  Negative for chest pain, palpitations and leg swelling.  ? ? ?  Objective  ?  ?BP 125/77 (BP Location: Left Arm, Patient Position: Sitting, Cuff Size: Large)   Pulse 61   Temp 97.7 ?F (36.5 ?C) (Oral)   Resp 16   Ht 6' 3.5" (1.918 m)   Wt 211 lb (95.7 kg)   BMI 26.03 kg/m?  ? ? ?Physical  Exam  ? ? ?General: Appearance:     ?Well developed, well nourished male in no acute distress  ?Eyes:    PERRL, conjunctiva/corneas clear, EOM's intact       ?Lungs:     Clear to auscultation bilaterally, respirations unlabored  ?Heart:    Normal heart rate. Normal rhythm. No murmurs, rubs, or gallops.    ?MS:   All extremities are intact.    ?Neurologic:   Awake, alert, oriented x 3. No apparent focal neurological defect.   ?   ?  ? Assessment & Plan  ?  ? ?1. Prostate cancer screening ? ?- PSA Total (Reflex To Free) ([facility] only) ? ?2. Essential (primary) hypertension ?Well controlled.  Continue current medications.   ?- CBC ?- Comprehensive metabolic panel ?- Lipid panel ?- EKG 12-Lead ? ?3. Allergic rhinitis due to pollen, unspecified seasonality ?Doing well on fexofenadine. Continue current medications.    ?   ? ?The entirety of the information documented in the History of Present Illness, Review of Systems and Physical Exam were personally obtained by me. Portions of this information were initially documented by the CMA and reviewed by me for thoroughness and accuracy.   ? ? ?Lelon Huh, MD  ?[facility] ?[phone] (phone) ?[phone] (fax) ? ?[facility]  ?

## 2022-01-12 DIAGNOSIS — Z125 Encounter for screening for malignant neoplasm of prostate: Secondary | ICD-10-CM | POA: Diagnosis not present

## 2022-01-12 DIAGNOSIS — I10 Essential (primary) hypertension: Secondary | ICD-10-CM | POA: Diagnosis not present

## 2022-01-12 LAB — LIPID PANEL

## 2022-01-13 LAB — PSA TOTAL (REFLEX TO FREE): Prostate Specific Ag, Serum: 0.1 ng/mL (ref 0.0–4.0)

## 2022-01-13 LAB — COMPREHENSIVE METABOLIC PANEL
ALT: 19 IU/L (ref 0–44)
AST: 17 IU/L (ref 0–40)
Albumin/Globulin Ratio: 1.5 (ref 1.2–2.2)
Albumin: 4.4 g/dL (ref 3.7–4.7)
Alkaline Phosphatase: 94 IU/L (ref 44–121)
BUN/Creatinine Ratio: 20 (ref 10–24)
BUN: 24 mg/dL (ref 8–27)
Bilirubin Total: 0.5 mg/dL (ref 0.0–1.2)
CO2: 26 mmol/L (ref 20–29)
Calcium: 10.1 mg/dL (ref 8.6–10.2)
Chloride: 101 mmol/L (ref 96–106)
Creatinine, Ser: 1.23 mg/dL (ref 0.76–1.27)
Globulin, Total: 2.9 g/dL (ref 1.5–4.5)
Glucose: 116 mg/dL — ABNORMAL HIGH (ref 70–99)
Potassium: 4.6 mmol/L (ref 3.5–5.2)
Sodium: 142 mmol/L (ref 134–144)
Total Protein: 7.3 g/dL (ref 6.0–8.5)
eGFR: 61 mL/min/{1.73_m2} (ref >59)

## 2022-01-13 LAB — CBC
Hematocrit: 48.7 % (ref 37.5–51.0)
Hemoglobin: 16.4 g/dL (ref 13.0–17.7)
MCH: 30.5 pg (ref 26.6–33.0)
MCHC: 33.7 g/dL (ref 31.5–35.7)
MCV: 91 fL (ref 79–97)
Platelets: 303 10*3/uL (ref 150–450)
RBC: 5.37 x10E6/uL (ref 4.14–5.80)
RDW: 12.5 % (ref 11.6–15.4)
WBC: 9 10*3/uL (ref 3.4–10.8)

## 2022-01-13 LAB — LIPID PANEL
Chol/HDL Ratio: 3.9 ratio (ref 0.0–5.0)
Cholesterol, Total: 166 mg/dL (ref 100–199)
HDL: 43 mg/dL (ref >39)
LDL Chol Calc (NIH): 101 mg/dL — ABNORMAL HIGH (ref 0–99)
Triglycerides: 124 mg/dL (ref 0–149)
VLDL Cholesterol Cal: 22 mg/dL (ref 5–40)

## 2022-01-20 ENCOUNTER — Ambulatory Visit (INDEPENDENT_AMBULATORY_CARE_PROVIDER_SITE_OTHER): Payer: Medicare Other | Admitting: Dermatology

## 2022-01-20 ENCOUNTER — Other Ambulatory Visit: Payer: Self-pay

## 2022-01-20 DIAGNOSIS — L280 Lichen simplex chronicus: Secondary | ICD-10-CM

## 2022-01-20 DIAGNOSIS — L578 Other skin changes due to chronic exposure to nonionizing radiation: Secondary | ICD-10-CM | POA: Diagnosis not present

## 2022-01-20 DIAGNOSIS — L570 Actinic keratosis: Secondary | ICD-10-CM

## 2022-01-20 DIAGNOSIS — L820 Inflamed seborrheic keratosis: Secondary | ICD-10-CM

## 2022-01-20 MED ORDER — MOMETASONE FUROATE 0.1 % EX CREA: 1.0000 "application " | TOPICAL_CREAM | Freq: Every day | CUTANEOUS | 1 refills | Status: DC | PRN

## 2022-01-20 MED ORDER — MUPIROCIN 2 % EX OINT
1.0000 | TOPICAL_OINTMENT | Freq: Every day | CUTANEOUS | 3 refills | Status: DC
Start: 2022-01-20 — End: 2023-03-08

## 2022-01-20 NOTE — Patient Instructions (Signed)

## 2022-01-20 NOTE — Progress Notes (Signed)
? ?Follow-Up Visit ?  ?Subjective  ?Brett Bender is a 76 y.o. male who presents for the following: Actinic Keratosis (Face, chest, arms, pt used 5FU/Calcipotriene bid x 4 days to temples and cheeks started 12/18/21). ?The patient has spots, moles and lesions to be evaluated, some may be new or changing and the patient has concerns that these could be cancer. ? ?The following portions of the chart were reviewed this encounter and updated as appropriate:  ? Tobacco  Allergies  Meds  Problems  Med Hx  Surg Hx  Fam Hx   ?  ?Review of Systems:  No other skin or systemic complaints except as noted in HPI or Assessment and Plan. ? ?Objective  ?Well appearing patient in no apparent distress; mood and affect are within normal limits. ? ?A focused examination was performed including face, chest,arms. Relevant physical exam findings are noted in the Assessment and Plan. ? ?ears/face x 6 (6) ?Pink scaly macules ? ?R chest x 1, L neck x1, L shoulder x 1 (3) ?Stuck on waxy paps with erythema ? ?R chest, L neck, L shoulder ?Crusted paps ? ? ?Assessment & Plan  ?AK (actinic keratosis) (6) ?ears/face x 6 ? ?Destruction of lesion - ears/face x 6 ?Complexity: simple   ?Destruction method: cryotherapy   ?Informed consent: discussed and consent obtained   ?Timeout:  patient name, date of birth, surgical site, and procedure verified ?Lesion destroyed using liquid nitrogen: Yes   ?Region frozen until ice ball extended beyond lesion: Yes   ?Outcome: patient tolerated procedure well with no complications   ?Post-procedure details: wound care instructions given   ? ?Related Medications ?fluorouracil (EFUDEX) 5 % cream ?Start on 12/18/21. Apply twice a day to temples and cheeks x 4 days. ? ?Inflamed seborrheic keratosis (3) ?R chest x 1, L neck x1, L shoulder x 1 ? ?Destruction of lesion - R chest x 1, L neck x1, L shoulder x 1 ?Complexity: simple   ?Destruction method: cryotherapy   ?Informed consent: discussed and consent obtained    ?Timeout:  patient name, date of birth, surgical site, and procedure verified ?Lesion destroyed using liquid nitrogen: Yes   ?Region frozen until ice ball extended beyond lesion: Yes   ?Outcome: patient tolerated procedure well with no complications   ?Post-procedure details: wound care instructions given   ? ?Lichen simplex chronicus /prurigo nodularis ?At sites of inflamed seborrheic keratosis where patient has been picking and scratching. ?R chest, L neck, L shoulder ? ?Area cleaned with Puracyn, followed by Mupirocin and bandaid. ? ?Cont Mupirocin oint qd ?Cont Mometasone cr qd ? ?mometasone (ELOCON) 0.1 % cream - R chest, L neck, L shoulder ?Apply 1 application. topically daily as needed (Rash). Qd up to 5 days a week to itchy bumps on body ?mupirocin ointment (BACTROBAN) 2 % - R chest, L neck, L shoulder ?Apply 1 application. topically daily. Qd to open sores ? ?Actinic Damage ?- chronic, secondary to cumulative UV radiation exposure/sun exposure over time ?- diffuse scaly erythematous macules with underlying dyspigmentation ?- Recommend daily broad spectrum sunscreen SPF 30+ to sun-exposed areas, reapply every 2 hours as needed.  ?- Recommend staying in the shade or wearing long sleeves, sun glasses (UVA+UVB protection) and wide brim hats (4-inch brim around the entire circumference of the hat). ?- Call for new or changing lesions.  ? ?Return in about 7 months (around 08/22/2022) for UBSE, Hx of BCC, Hx of AKs. ? ?I, Sonya Hupman, RMA, am acting as scribe for  Sarina Ser, MD . ?Documentation: I have reviewed the above documentation for accuracy and completeness, and I agree with the above. ? ?Sarina Ser, MD ? ?

## 2022-01-26 ENCOUNTER — Encounter: Payer: Self-pay | Admitting: Dermatology

## 2022-03-03 ENCOUNTER — Ambulatory Visit (INDEPENDENT_AMBULATORY_CARE_PROVIDER_SITE_OTHER): Payer: Medicare Other | Admitting: Dermatology

## 2022-03-03 DIAGNOSIS — L2489 Irritant contact dermatitis due to other agents: Secondary | ICD-10-CM

## 2022-03-03 DIAGNOSIS — L0100 Impetigo, unspecified: Secondary | ICD-10-CM | POA: Diagnosis not present

## 2022-03-03 MED ORDER — DOXYCYCLINE MONOHYDRATE 100 MG PO CAPS: 100.0000 mg | ORAL_CAPSULE | Freq: Two times a day (BID) | ORAL | 0 refills | Status: DC

## 2022-03-03 NOTE — Patient Instructions (Addendum)
Impetigo ?Start Mupirocin ointment 2 times daily ?Start Doxycycline '100mg'$  1 pill twice a day with food and drink for 1 week ? ?Doxycycline should be taken with food to prevent nausea. Do not lay down for 30 minutes after taking. Be cautious with sun exposure and use good sun protection while on this medication. Pregnant women should not take this medication.   ? ? ? ?If You Need Anything After Your Visit ? ?If you have any questions or concerns for your doctor, please call our main line at [phone] and press option 4 to reach your doctor's medical assistant. If no one answers, please leave a voicemail as directed and we will return your call as soon as possible. Messages left after 4 pm will be answered the following business day.  ? ?You may also send [place] a message via MyChart. We typically respond to MyChart messages within 1-2 business days. ? ?For prescription refills, please ask your pharmacy to contact our office. Our fax number is [phone]. ? ?If you have an urgent issue when the clinic is closed that cannot wait until the next business day, you can page your doctor at the number below.   ? ?Please note that while we do our best to be available for urgent issues outside of office hours, we are not available 24/7.  ? ?If you have an urgent issue and are unable to reach [place], you may choose to seek medical care at your doctor's office, retail clinic, urgent care center, or emergency room. ? ?If you have a medical emergency, please immediately call 911 or go to the emergency department. ? ?Pager Numbers ? ?- Dr. Nehemiah Massed: [phone] ? ?- Dr. Laurence Ferrari: [phone] ? ?- Dr. Nicole Kindred: [phone] ? ?In the event of inclement weather, please call our main line at [phone] for an update on the status of any delays or closures. ? ?Dermatology Medication Tips: ?Please keep the boxes that topical medications come in in order to help keep track of the instructions about where and how to use these. Pharmacies  typically print the medication instructions only on the boxes and not directly on the medication tubes.  ? ?If your medication is too expensive, please contact our office at [phone] option 4 or send [place] a message through [place].  ? ?We are unable to tell what your co-pay for medications will be in advance as this is different depending on your insurance coverage. However, we may be able to find a substitute medication at lower cost or fill out paperwork to get insurance to cover a needed medication.  ? ?If a prior authorization is required to get your medication covered by your insurance company, please allow [place] 1-2 business days to complete this process. ? ?Drug prices often vary depending on where the prescription is filled and some pharmacies may offer cheaper prices. ? ?The website www.goodrx.com contains coupons for medications through different pharmacies. The prices here do not account for what the cost may be with help from insurance (it may be cheaper with your insurance), but the website can give you the price if you did not use any insurance.  ?- You can print the associated coupon and take it with your prescription to the pharmacy.  ?- You may also stop by our office during regular business hours and pick up a GoodRx coupon card.  ?- If you need your prescription sent electronically to a different pharmacy, notify our office through [facility] or by phone at [phone] option 4. ? ? ? ? ?  Si Usted Necesita Algo Despu?s de Su Visita ? ?Tambi?n puede enviarnos un mensaje a trav?s de MyChart. Por lo general respondemos a los mensajes de MyChart en el transcurso de 1 a 2 d?as h?biles. ? ?Para renovar recetas, por favor pida a su farmacia que se ponga en contacto con nuestra oficina. Nuestro n?mero de fax es el [phone]. ? ?Si tiene un asunto urgente cuando la cl?nica est? cerrada y que no puede esperar hasta el siguiente d?a h?bil, puede llamar/localizar a su doctor(a) al n?mero que  aparece a continuaci?n.  ? ?Por favor, tenga en cuenta que aunque hacemos todo lo posible para estar disponibles para asuntos urgentes fuera del horario de oficina, no estamos disponibles las 24 horas del d?a, los 7 d?as de la semana.  ? ?Si tiene un problema urgente y no puede comunicarse con nosotros, puede optar por buscar atenci?n m?dica  en el consultorio de su doctor(a), en una cl?nica privada, en un centro de atenci?n urgente o en una sala de emergencias. ? ?Si tiene Engineer, maintenance (IT) m?dica, por favor llame inmediatamente al 911 o vaya a la sala de emergencias. ? ?N?meros de b?per ? ?- Dr. Nehemiah Massed: [phone] ? ?- Dra. Moye: [phone] ? ?- Dra. Nicole Kindred: [phone] ? ?En caso de inclemencias del tiempo, por favor llame a nuestra l?nea principal al [phone] para una actualizaci?n sobre el estado de cualquier retraso o cierre. ? ?Consejos para la medicaci?n en dermatolog?a: ?Por favor, guarde las cajas en las que vienen los medicamentos de uso t?pico para ayudarle a seguir las instrucciones sobre d?nde y c?mo usarlos. Las farmacias generalmente imprimen las instrucciones del medicamento s?lo en las cajas y no directamente en los tubos del [place].  ? ?Si su medicamento es muy caro, por favor, p?ngase en contacto con Zigmund Daniel llamando al [phone] y presione la opci?n 4 o env?enos un mensaje a trav?s de MyChart.  ? ?No podemos decirle cu?l ser? su copago por los medicamentos por adelantado ya que esto es diferente dependiendo de la cobertura de su seguro. Sin embargo, es posible que podamos encontrar un medicamento sustituto a Electrical engineer un formulario para que el seguro cubra el medicamento que se considera necesario.  ? ?Si se requiere Ardelia Mems autorizaci?n previa para que su compa??a de seguros [place] su medicamento, por favor perm?tanos de 1 a 2 d?as h?biles para completar este proceso. ? ?Los precios de los medicamentos var?an con frecuencia dependiendo del Environmental consultant de d?nde se surte  la receta y alguna farmacias pueden ofrecer precios m?s baratos. ? ?El sitio web www.goodrx.com tiene cupones para medicamentos de Airline pilot. Los precios aqu? no tienen en cuenta lo que podr?a costar con la ayuda del seguro (puede ser m?s barato con su seguro), pero el sitio web puede darle el precio si no utiliz? ning?n seguro.  ?- Puede imprimir el cup?n correspondiente y llevarlo con su receta a la farmacia.  ?- Tambi?n puede pasar por nuestra oficina durante el horario de atenci?n regular y recoger una tarjeta de cupones de GoodRx.  ?- Si necesita que su receta se env?e electr?nicamente a Chiropodist, informe a nuestra oficina a trav?s de MyChart de [place] o por tel?fono llamando al [phone] y presione la opci?n 4.  ?

## 2022-03-03 NOTE — Progress Notes (Signed)
? ?  Follow-Up Visit ?  ?Subjective  ?Brett Bender is a 76 y.o. male who presents for the following: check non healing sores (Perioral, pt used 5FU/Calcipotriene cream around mouth bid x 4 days, then got sores and used the Mometasone cream bid 1.5 weeks, ). ? ?The following portions of the chart were reviewed this encounter and updated as appropriate:  ? Tobacco  Allergies  Meds  Problems  Med Hx  Surg Hx  Fam Hx   ?  ?Review of Systems:  No other skin or systemic complaints except as noted in HPI or Assessment and Plan. ? ?Objective  ?Well appearing patient in no apparent distress; mood and affect are within normal limits. ? ?A focused examination was performed including face. Relevant physical exam findings are noted in the Assessment and Plan. ? ?upper lip, chin ?Crustings on the chin ? ? ?Assessment & Plan  ?Impetigo -after severe irritation from misuse of 5-fluorouracil topically ?upper lip, chin ? ?Start Mupirocin oint bid to aa face ?Start Doxycycline '100mg'$  1 po bid for 7 days with food and drink ? ?Doxycycline should be taken with food to prevent nausea. Do not lay down for 30 minutes after taking. Be cautious with sun exposure and use good sun protection while on this medication. Pregnant women should not take this medication.   ? ?doxycycline (MONODOX) 100 MG capsule - upper lip, chin ?Take 1 capsule (100 mg total) by mouth 2 (two) times daily. Take with food and drink ? ?Irritant contact dermatitis -severe - from inappropriate use of 5-FU for a rash in the perioral area ?Head - Anterior (Face) ? ?D/c fluorouracil  ? ?Return for as scheduled. ? ?I, Othelia Pulling, RMA, am acting as scribe for Sarina Ser, MD . ?Documentation: I have reviewed the above documentation for accuracy and completeness, and I agree with the above. ? ?Sarina Ser, MD ? ?

## 2022-03-14 ENCOUNTER — Encounter: Payer: Self-pay | Admitting: Dermatology

## 2022-03-23 ENCOUNTER — Other Ambulatory Visit: Payer: Self-pay | Admitting: Family Medicine

## 2022-03-23 DIAGNOSIS — M1990 Unspecified osteoarthritis, unspecified site: Secondary | ICD-10-CM

## 2022-04-19 DIAGNOSIS — H401131 Primary open-angle glaucoma, bilateral, mild stage: Secondary | ICD-10-CM | POA: Diagnosis not present

## 2022-05-27 ENCOUNTER — Other Ambulatory Visit: Payer: Self-pay | Admitting: Family Medicine

## 2022-05-27 DIAGNOSIS — G4700 Insomnia, unspecified: Secondary | ICD-10-CM

## 2022-05-27 DIAGNOSIS — F32A Depression, unspecified: Secondary | ICD-10-CM

## 2022-05-27 NOTE — Telephone Encounter (Signed)
Refill for 1 mo. Please, schedule a fu appt with your PCP within 1 mo

## 2022-06-08 NOTE — Progress Notes (Unsigned)
8:45 AM   Brett Bender [birth year] [id number]  Referring provider: Birdie Sons, MD [address] [place],  [place] [address]  Urological history: 1. High risk hematuria -non-smoker -CTU 2020 NED -cysto 2020 NED -urine cytology NED 2020 -no reports of gross heme -UA neg for micro heme, 09/2021  2. Nephrolithiasis -Stone composition was calcium oxalate dihydrate 30%, calcium oxalate monohydrate 60% and calcium phosphate 10% -[facility] results noted he was at increased risk for stone formation due to borderline hyperoxaluria, hypocitraturia, high urine pH and mild hyperurlcosuria.  He was given instruction on dietary changes and encouraged to drink 2.5 L of water daily -URS 2017 -CTU 2020 stones throughout the right kidney measuring up to 5 mm within the superior pole and 4 mm within the inferior pole. No ureterolithiasis. No hydronephrosis -KUB 2022 6 stones visible in the right kidney with largest 6 mm in size and 2 faint calcifications in the left   3. BPH with LU TS -PSA, 01/2022 - 0.1 -I PSS 6/2 -cysto 2020 enlarged prostate bilobar coaptation, 4 cm prosthetic length -tamsulosin 0.4 mg daily and finasteride 5 mg daily   Chief Complaint  Patient presents with   Benign Prostatic Hypertrophy   Nephrolithiasis   Hematuria    HPI: Brett Bender is a 76 y.o. male who presents today for yearly follow up.   In the interim, he was seen by Sam in 07/2021 for dysuria.  UA was clear.  Urine culture was negative.  His tamsulosin was doubled for two weeks.    KUB today demonstrates 4 renal stones with largest stone at 6 mm on the right and 2 renal stones on left with largest stone at 4 mm.  He states the dysuria abated with the doubling of the tamsulosin.  He is now back to one.  Patient denies any modifying or aggravating factors.  Patient denies any gross hematuria, dysuria or suprapubic/flank pain.  Patient denies any fevers, chills, nausea or vomiting.      IPSS     Row Name 06/09/22 0800         International Prostate Symptom Score   How often have you had the sensation of not emptying your bladder? Less than 1 in 5     How often have you had to urinate less than every two hours? Less than half the time     How often have you found you stopped and started again several times when you urinated? Not at All     How often have you found it difficult to postpone urination? Not at All     How often have you had a weak urinary stream? Less than half the time     How often have you had to strain to start urination? Not at All     How many times did you typically get up at night to urinate? 1 Time     Total IPSS Score 6       Quality of Life due to urinary symptoms   If you were to spend the rest of your life with your urinary condition just the way it is now how would you feel about that? Mostly Satisfied                Score:  1-7 Mild 8-19 Moderate 20-35 Severe   PMH: Past Medical History:  Diagnosis Date   Actinic keratosis    Arthritis    Basal cell carcinoma 11/09/2013  R lateral edge of clavicle   Basal cell carcinoma 02/03/2016   L lateral supraclavicular base of neck/excision   Basal cell carcinoma 11/29/2018   L upper back   Basal cell carcinoma 09/11/2020   R mid back    Cancer (HCC)    Basal cell carcinoma bilateral arms and left shoulder   Complication of anesthesia    slow to wake up with shoulder surgery   Depression    Enlarged prostate    H/O elbow surgery    left   Herpes simplex    History of kidney stones    History of MRSA infection 2004   skin around eye   Hypertension    Kidney stone    Lumbar stenosis    Migraine headache    Motion sickness    ocean boats   PONV (postoperative nausea and vomiting)    Tendonitis of ankle, left     Surgical History: Past Surgical History:  Procedure Laterality Date   APPENDECTOMY     back injection     BACK SURGERY      CYSTOSCOPY/URETEROSCOPY/HOLMIUM LASER/STENT PLACEMENT Left 12/18/2015   Procedure: CYSTOSCOPY/URETEROSCOPY/HOLMIUM LASER LITHOTRIPSY;  Surgeon: Hollice Espy, MD;  Location: [facility];  Service: Urology;  Laterality: Left;   ELBOW SURGERY     LOWER BACK SURGERY  2010   L4-5 fusion   LUMBAR LAMINECTOMY/DECOMPRESSION MICRODISCECTOMY N/A 08/21/2018   Procedure: LUMBAR LAMINECTOMY/DECOMPRESSION MICRODISCECTOMY 1 LEVEL-L3/4;  Surgeon: Deetta Perla, MD;  Location: [facility];  Service: Neurosurgery;  Laterality: N/A;   NECK SURGERY     Upper neck surgery; with wires placed   SHOULDER SURGERY Right 11/10/2011   arthroscopic surgery . Dr. Tamala Julian, [place] Left 12/04/2019   Procedure: TRICEPS TENDON REPAIR, OLECRANON BURSA;  Surgeon: Leim Fabry, MD;  Location: [facility];  Service: Orthopedics;  Laterality: Left;    Home Medications:  Allergies as of 06/09/2022       Reactions   Penicillins Other (See Comments)   Stomach Ache        Medication List        Accurate as of June 09, 2022  8:45 AM. If you have any questions, ask your nurse or doctor.          STOP taking these medications    doxycycline 100 MG capsule Commonly known as: MONODOX Stopped by: Trenika Hudson, PA-C       TAKE these medications    ALPRAZolam 0.5 MG tablet Commonly known as: XANAX Take 0.5 mg by mouth at bedtime as needed for anxiety.   amLODipine 10 MG tablet Commonly known as: NORVASC Take 1 tablet (10 mg total) by mouth daily.   aspirin 81 MG chewable tablet Chew by mouth daily.   atenolol-chlorthalidone 50-25 MG tablet Commonly known as: TENORETIC TAKE 1/2 TABLET BY MOUTH ONCE DAILY   butalbital-acetaminophen-caffeine 50-325-40 MG tablet Commonly known as: Bac TAKE 1 OR 2 TABLETS BY MOUTH EVERY 6 HOURS AS NEEDED FOR HEADACHE   diclofenac 50 MG EC tablet Commonly known as: VOLTAREN TAKE 1 TABLET BY MOUTH TWICE DAILY AS NEEDED   diclofenac  Sodium 1 % Gel Commonly known as: VOLTAREN APPLY 4G TOPICALLY FIVE TIMES DAILY   DIGESTIVE ADVANTAGE PO Take 1 tablet by mouth daily.   fexofenadine 180 MG tablet Commonly known as: Allegra Allergy Take 1 tablet (180 mg total) by mouth daily.   finasteride 5 MG tablet Commonly known as: PROSCAR Take 1  tablet (5 mg total) by mouth daily.   fluorouracil 5 % cream Commonly known as: EFUDEX Start on 12/18/21. Apply twice a day to temples and cheeks x 4 days.   latanoprost 0.005 % ophthalmic solution Commonly known as: XALATAN Place 1 drop into both eyes at bedtime.   mirtazapine 30 MG tablet Commonly known as: REMERON TAKE 1 TABLET BY MOUTH EVERY NIGHT AT BEDTIME   mometasone 0.1 % cream Commonly known as: ELOCON Apply 1 application. topically daily as needed (Rash). Qd up to 5 days a week to itchy bumps on body   multivitamin tablet Take 1 tablet by mouth daily.   mupirocin ointment 2 % Commonly known as: BACTROBAN Apply 1 application. topically daily. Qd to open sores   psyllium 58.6 % powder Commonly known as: METAMUCIL Take 1 packet by mouth daily as needed (regularity).   tamsulosin 0.4 MG Caps capsule Commonly known as: FLOMAX Take 1 capsule (0.4 mg total) by mouth daily.   timolol 0.5 % ophthalmic solution Commonly known as: TIMOPTIC Place 1 drop into both eyes daily.        Allergies:  Allergies  Allergen Reactions   Penicillins Other (See Comments)    Stomach Ache    Family History: Family History  Problem Relation Age of Onset   Depression Son    Diabetes Neg Hx    Cancer Neg Hx    Heart attack Neg Hx    Kidney disease Neg Hx    Prostate cancer Neg Hx    Kidney cancer Neg Hx    Bladder Cancer Neg Hx     Social History:  reports that he has never smoked. He has never used smokeless tobacco. He reports that he does not drink alcohol and does not use drugs.  ROS: For pertinent review of systems please refer to history of present  illness  Physical Exam: BP (!) 142/78   Pulse (!) 52   Ht '6\' 4"'  (1.93 m)   Wt 209 lb (94.8 kg)   BMI 25.44 kg/m   Constitutional:  Well nourished. Alert and oriented, No acute distress. HEENT: [place] AT, moist mucus membranes.  Trachea midline Cardiovascular: No clubbing, cyanosis, or edema. Respiratory: Normal respiratory effort, no increased work of breathing. GU: No CVA tenderness.  No bladder fullness or masses.  Patient with circumcised phallus.  Urethral meatus is patent.  No penile discharge. No penile lesions or rashes. Scrotum without lesions, cysts, rashes and/or edema.  Testicles are located scrotally bilaterally. No masses are appreciated in the testicles. Left and right epididymis are normal. Rectal: Patient with  normal sphincter tone. Anus and perineum without scarring or rashes. No rectal masses are appreciated. Prostate is approximately 45 grams, no nodules are appreciated. Seminal vesicles could not be palpated Neurologic: Grossly intact, no focal deficits, moving all 4 extremities. Psychiatric: Normal mood and affect.   Laboratory Data: Results for orders placed or performed in visit on 01/11/22  PSA Total (Reflex To Free) ([facility] only)  Result Value Ref Range   Prostate Specific Ag, Serum 0.1 0.0 - 4.0 ng/mL   Reflex Criteria Comment   CBC  Result Value Ref Range   WBC 9.0 3.4 - 10.8 x10E3/uL   RBC 5.37 4.14 - 5.80 x10E6/uL   Hemoglobin 16.4 13.0 - 17.7 g/dL   Hematocrit 48.7 37.5 - 51.0 %   MCV 91 79 - 97 fL   MCH 30.5 26.6 - 33.0 pg   MCHC 33.7 31.5 - 35.7 g/dL   RDW  12.5 11.6 - 15.4 %   Platelets 303 150 - 450 x10E3/uL  Comprehensive metabolic panel  Result Value Ref Range   Glucose 116 (H) 70 - 99 mg/dL   BUN 24 8 - 27 mg/dL   Creatinine, Ser 1.23 0.76 - 1.27 mg/dL   eGFR 61 >59 mL/min/1.73   BUN/Creatinine Ratio 20 10 - 24   Sodium 142 134 - 144 mmol/L   Potassium 4.6 3.5 - 5.2 mmol/L   Chloride 101 96 - 106 mmol/L   CO2 26 20 - 29 mmol/L   Calcium  10.1 8.6 - 10.2 mg/dL   Total Protein 7.3 6.0 - 8.5 g/dL   Albumin 4.4 3.7 - 4.7 g/dL   Globulin, Total 2.9 1.5 - 4.5 g/dL   Albumin/Globulin Ratio 1.5 1.2 - 2.2   Bilirubin Total 0.5 0.0 - 1.2 mg/dL   Alkaline Phosphatase 94 44 - 121 IU/L   AST 17 0 - 40 IU/L   ALT 19 0 - 44 IU/L  Lipid panel  Result Value Ref Range   Cholesterol, Total 166 100 - 199 mg/dL   Triglycerides 124 0 - 149 mg/dL   HDL 43 >39 mg/dL   VLDL Cholesterol Cal 22 5 - 40 mg/dL   LDL Chol Calc (NIH) 101 (H) 0 - 99 mg/dL   Chol/HDL Ratio 3.9 0.0 - 5.0 ratio    Lab Results  Component Value Date   WBC 9.0 01/12/2022   HGB 16.4 01/12/2022   HCT 48.7 01/12/2022   MCV 91 01/12/2022   PLT 303 01/12/2022    Lab Results  Component Value Date   CREATININE 1.23 01/12/2022       Component Value Date/Time   CHOL 166 01/12/2022 0712   HDL 43 01/12/2022 0712   CHOLHDL 3.9 01/12/2022 0712   LDLCALC 101 (H) 01/12/2022 0712    Lab Results  Component Value Date   AST 17 01/12/2022   Lab Results  Component Value Date   ALT 19 01/12/2022  I have reviewed the labs.  Pertinent Imaging: CLINICAL DATA:  Kidney stone follow-up.   EXAM: ABDOMEN - 1 VIEW   COMPARISON:  June 04, 2021   FINDINGS: The bowel gas pattern is normal. Multiple kidney stones are identified in the right kidney largest probably 6 mm unchanged compared to prior x-ray. There are tiny 1-2 mm stones within the left kidney. Scoliosis of spine noted.   IMPRESSION: Bilateral kidney stones as described.     Electronically Signed   By: Abelardo Diesel M.D.   On: 06/09/2022 08:32  I have independently reviewed the films.  See HPI.     Assessment & Plan:    1. BPH with LU TS -continue tamsulosin 0.4 mg and finasteride 5 mg daily  2. Nephrolithiasis -bilateral nephrolithiasis - stable -no episodes of flank pain or passage of stone  3. High risk hematuria -non-smoker -Hematuria work up completed in 05/2019 - findings positive for  enlarged prostate -No report of gross hematuria  -UA 09/2021 negative for micro heme  Return in about 1 year (around 06/10/2023) for KUB, I PSS, PSA and exam .  These notes generated with voice recognition software. I apologize for typographical errors.  Zara Council, PA-C  [facility] [address] [place], [place] [address] [phone]

## 2022-06-09 ENCOUNTER — Ambulatory Visit
Admission: RE | Admit: 2022-06-09 | Discharge: 2022-06-09 | Disposition: A | Discharge status: Home / Self Care | Payer: Medicare Other | Source: Ambulatory Visit | Attending: Urology | Admitting: Urology

## 2022-06-09 ENCOUNTER — Ambulatory Visit
Admission: RE | Admit: 2022-06-09 | Discharge: 2022-06-09 | Disposition: A | Discharge status: Home / Self Care | Payer: Medicare Other | Attending: Urology | Admitting: Urology

## 2022-06-09 ENCOUNTER — Ambulatory Visit (INDEPENDENT_AMBULATORY_CARE_PROVIDER_SITE_OTHER): Payer: Medicare Other | Admitting: Urology

## 2022-06-09 ENCOUNTER — Other Ambulatory Visit: Payer: Self-pay | Admitting: Urology

## 2022-06-09 ENCOUNTER — Encounter: Payer: Self-pay | Admitting: Urology

## 2022-06-09 VITALS — BP 142/78 | HR 52 | Ht 76.0 in | Wt 209.0 lb

## 2022-06-09 DIAGNOSIS — N200 Calculus of kidney: Secondary | ICD-10-CM

## 2022-06-09 DIAGNOSIS — R319 Hematuria, unspecified: Secondary | ICD-10-CM

## 2022-06-09 DIAGNOSIS — N138 Other obstructive and reflux uropathy: Secondary | ICD-10-CM

## 2022-06-09 DIAGNOSIS — N401 Enlarged prostate with lower urinary tract symptoms: Secondary | ICD-10-CM | POA: Diagnosis not present

## 2022-06-09 DIAGNOSIS — M419 Scoliosis, unspecified: Secondary | ICD-10-CM | POA: Diagnosis not present

## 2022-06-09 MED ORDER — FINASTERIDE 5 MG PO TABS: 5.0000 mg | ORAL_TABLET | Freq: Every day | ORAL | 3 refills | Status: DC

## 2022-06-09 MED ORDER — TAMSULOSIN HCL 0.4 MG PO CAPS: 0.4000 mg | ORAL_CAPSULE | Freq: Every day | ORAL | 3 refills | Status: DC

## 2022-06-22 ENCOUNTER — Other Ambulatory Visit: Payer: Self-pay | Admitting: Family Medicine

## 2022-06-22 DIAGNOSIS — I10 Essential (primary) hypertension: Secondary | ICD-10-CM

## 2022-06-22 NOTE — Telephone Encounter (Signed)
Requested Prescriptions  Pending Prescriptions Disp Refills  . amLODipine (NORVASC) 10 MG tablet [Pharmacy Med Name: AMLODIPINE BESYLATE 10 MG TAB] 90 tablet 4    Sig: TAKE 1 TABLET BY MOUTH ONCE A DAY     Cardiovascular: Calcium Channel Blockers 2 Failed - 06/22/2022 12:06 PM      Failed - Last BP in normal range    BP Readings from Last 1 Encounters:  06/09/22 (!) 142/78         Passed - Last Heart Rate in normal range    Pulse Readings from Last 1 Encounters:  06/09/22 (!) 52         Passed - Valid encounter within last 6 months    Recent Outpatient Visits          5 months ago Prostate cancer screening   [facility] Birdie Sons, MD   1 year ago COVID-13   [place], Avanti, MD   1 year ago Essential (primary) hypertension   [facility], Kirstie Peri, MD   1 year ago Essential (primary) hypertension   [facility], Kirstie Peri, MD   1 year ago Essential (primary) hypertension   [place], Kirstie Peri, MD      Future Appointments            In 2 months Ralene Bathe, MD [place]   In 11 months [place], [place]

## 2022-07-01 ENCOUNTER — Ambulatory Visit (INDEPENDENT_AMBULATORY_CARE_PROVIDER_SITE_OTHER): Payer: Medicare Other | Admitting: Dermatology

## 2022-07-01 DIAGNOSIS — L570 Actinic keratosis: Secondary | ICD-10-CM

## 2022-07-01 DIAGNOSIS — L820 Inflamed seborrheic keratosis: Secondary | ICD-10-CM

## 2022-07-01 DIAGNOSIS — L578 Other skin changes due to chronic exposure to nonionizing radiation: Secondary | ICD-10-CM | POA: Diagnosis not present

## 2022-07-01 DIAGNOSIS — L98499 Non-pressure chronic ulcer of skin of other sites with unspecified severity: Secondary | ICD-10-CM

## 2022-07-01 DIAGNOSIS — T148XXA Other injury of unspecified body region, initial encounter: Secondary | ICD-10-CM

## 2022-07-01 NOTE — Patient Instructions (Addendum)
Cryotherapy Aftercare  Wash gently with soap and water everyday.   Apply Vaseline and Band-Aid daily until healed.   Spot on left posterior neck Leave Duoderm on for 1 week, after one week take Duoderm off and may restart Mupirocin ointment every other day alternating with Mometasone cream ever other day.  Spot on ear Clean daily with soap and water, followed by mupirocin ointment and bandaid   Due to recent changes in healthcare laws, you may see results of your pathology and/or laboratory studies on MyChart before the doctors have had a chance to review them. We understand that in some cases there may be results that are confusing or concerning to you. Please understand that not all results are received at the same time and often the doctors may need to interpret multiple results in order to provide you with the best plan of care or course of treatment. Therefore, we ask that you please give [place] 2 business days to thoroughly review all your results before contacting the office for clarification. Should we see a critical lab result, you will be contacted sooner.   If You Need Anything After Your Visit  If you have any questions or concerns for your doctor, please call our main line at [phone] and press option 4 to reach your doctor's medical assistant. If no one answers, please leave a voicemail as directed and we will return your call as soon as possible. Messages left after 4 pm will be answered the following business day.   You may also send [place] a message via [place]. We typically respond to MyChart messages within 1-2 business days.  For prescription refills, please ask your pharmacy to contact our office. Our fax number is [phone].  If you have an urgent issue when the clinic is closed that cannot wait until the next business day, you can page your doctor at the number below.    Please note that while we do our best to be available for urgent issues outside of office hours, we  are not available 24/7.   If you have an urgent issue and are unable to reach [place], you may choose to seek medical care at your doctor's office, retail clinic, urgent care center, or emergency room.  If you have a medical emergency, please immediately call 911 or go to the emergency department.  Pager Numbers  - Dr. Nehemiah Massed: [phone]  - Dr. Laurence Ferrari: [phone]  - Dr. Nicole Kindred: [phone]  In the event of inclement weather, please call our main line at [phone] for an update on the status of any delays or closures.  Dermatology Medication Tips: Please keep the boxes that topical medications come in in order to help keep track of the instructions about where and how to use these. Pharmacies typically print the medication instructions only on the boxes and not directly on the medication tubes.   If your medication is too expensive, please contact our office at [phone] option 4 or send [place] a message through [place].   We are unable to tell what your co-pay for medications will be in advance as this is different depending on your insurance coverage. However, we may be able to find a substitute medication at lower cost or fill out paperwork to get insurance to cover a needed medication.   If a prior authorization is required to get your medication covered by your insurance company, please allow [place] 1-2 business days to complete this process.  Drug prices often vary depending on where the  prescription is filled and some pharmacies may offer cheaper prices.  The website www.goodrx.com contains coupons for medications through different pharmacies. The prices here do not account for what the cost may be with help from insurance (it may be cheaper with your insurance), but the website can give you the price if you did not use any insurance.  - You can print the associated coupon and take it with your prescription to the pharmacy.  - You may also stop by our office during regular business  hours and pick up a GoodRx coupon card.  - If you need your prescription sent electronically to a different pharmacy, notify our office through [facility] or by phone at [phone] option 4.     Si Usted Necesita Algo Despus de Su Visita  Tambin puede enviarnos un mensaje a travs de Pharmacist, community. Por lo general respondemos a los mensajes de MyChart en el transcurso de 1 a 2 das hbiles.  Para renovar recetas, por favor pida a su farmacia que se ponga en contacto con nuestra oficina. Harland Dingwall de fax es [place] [phone].  Si tiene un asunto urgente cuando la clnica est cerrada y que no puede esperar hasta el siguiente da hbil, puede llamar/localizar a su doctor(a) al nmero que aparece a continuacin.   Por favor, tenga en cuenta que aunque hacemos todo lo posible para estar disponibles para asuntos urgentes fuera del horario de [place], no estamos disponibles las 24 horas del da, los 7 das de la [place].   Si tiene un problema urgente y no puede comunicarse con nosotros, puede optar por buscar atencin mdica  en el consultorio de su doctor(a), en una clnica privada, en un centro de atencin urgente o en una sala de emergencias.  Si tiene Engineering geologist, por favor llame inmediatamente al 911 o vaya a la sala de emergencias.  Nmeros de bper  - Dr. Nehemiah Massed: [phone]  - Dra. Moye: [phone]  - Dra. Nicole Kindred: [phone]  En caso de inclemencias del [place], por favor llame a Johnsie Kindred principal al [phone] para una actualizacin sobre el [place] de cualquier retraso o cierre.  Consejos para la medicacin en dermatologa: Por favor, guarde las cajas en las que vienen los medicamentos de uso tpico para ayudarle a seguir las instrucciones sobre dnde y cmo usarlos. Las farmacias generalmente imprimen las instrucciones del medicamento slo en las cajas y no directamente en los tubos del [place].   Si su medicamento es muy caro, por favor,  pngase en contacto con Zigmund Daniel llamando al [phone] y presione la opcin 4 o envenos un mensaje a travs de Pharmacist, community.   No podemos decirle cul ser su copago por los medicamentos por adelantado ya que esto es diferente dependiendo de la cobertura de su seguro. Sin embargo, es posible que podamos encontrar un medicamento sustituto a Electrical engineer un formulario para que el seguro cubra el medicamento que se considera necesario.   Si se requiere una autorizacin previa para que su compaa de seguros [place] su medicamento, por favor permtanos de 1 a 2 das hbiles para completar este proceso.  Los precios de los medicamentos varan con frecuencia dependiendo del Environmental consultant de dnde se surte la receta y alguna farmacias pueden ofrecer precios ms baratos.  El sitio web www.goodrx.com tiene cupones para medicamentos de Airline pilot. Los precios aqu no tienen en cuenta lo que podra costar con la ayuda del seguro (puede ser ms barato con su seguro), pero el sitio web  puede darle el precio si no utiliz ningn seguro.  - Puede imprimir el cupn correspondiente y llevarlo con su receta a la farmacia.  - Tambin puede pasar por nuestra oficina durante el horario de atencin regular y recoger una tarjeta de cupones de GoodRx.  - Si necesita que su receta se enve electrnicamente a una farmacia diferente, informe a nuestra oficina a travs de MyChart de [place] o por telfono llamando al [phone] y presione la opcin 4.  

## 2022-07-01 NOTE — Progress Notes (Unsigned)
Follow-Up Visit   Subjective  Brett Bender is a 76 y.o. male who presents for the following: sore that will not heal (L post base of neck, 40m alternating mupirocin and mometasone) and growths (Bil axilla, pt wants treated). The patient has spots, moles and lesions to be evaluated, some may be new or changing and the patient has concerns that these could be cancer.  The following portions of the chart were reviewed this encounter and updated as appropriate:   Tobacco  Allergies  Meds  Problems  Med Hx  Surg Hx  Fam Hx     Review of Systems:  No other skin or systemic complaints except as noted in HPI or Assessment and Plan.  Objective  Well appearing patient in no apparent distress; mood and affect are within normal limits.  A focused examination was performed including left post base of neck, bil axilla. Relevant physical exam findings are noted in the Assessment and Plan.  Neck - Posterior 1.0cm superficial ulcer  Right Ear Excoriation R ear  face, neck x 12 (12) Pink scaly macules  upper arms x 19 (19) Stuck on waxy paps with erythema   Assessment & Plan  Superficial ulcer ([place] Secondary to picking.  Patient admits to picking at this area and it is a habit. Neck - Posterior Wound cleansed with Puracyn, followed by mupirocin and extra thin duoderm, leave on for 1 week, after one week may restart Mupirocin oint qod alternating with Mometasone cream qod Recommend N-acetylcysteine (NAC) 600 mg supplement three times per day to help with picking  Excoriation with crust Secondary to picking.  Patient admits to picking at this area and it is a habit. Right Ear Wound cleansed with Puracyn followed by mupirocin and band aid Recommend N-acetylcysteine (NAC) 600 mg supplement three times per day to help with picking  AK (actinic keratosis) (12) face, neck x 12 Destruction of lesion - face, neck x 12 Complexity: simple   Destruction method: cryotherapy   Informed  consent: discussed and consent obtained   Timeout:  patient name, date of birth, surgical site, and procedure verified Lesion destroyed using liquid nitrogen: Yes   Region frozen until ice ball extended beyond lesion: Yes   Outcome: patient tolerated procedure well with no complications   Post-procedure details: wound care instructions given    Related Medications fluorouracil (EFUDEX) 5 % cream Start on 12/18/21. Apply twice a day to temples and cheeks x 4 days.  Inflamed seborrheic keratosis (19) upper arms x 19 Symptomatic, irritating, patient would like treated. Destruction of lesion - upper arms x 19 Complexity: simple   Destruction method: cryotherapy   Informed consent: discussed and consent obtained   Timeout:  patient name, date of birth, surgical site, and procedure verified Lesion destroyed using liquid nitrogen: Yes   Region frozen until ice ball extended beyond lesion: Yes   Outcome: patient tolerated procedure well with no complications   Post-procedure details: wound care instructions given    Actinic Damage - chronic, secondary to cumulative UV radiation exposure/sun exposure over time - diffuse scaly erythematous macules with underlying dyspigmentation - Recommend daily broad spectrum sunscreen SPF 30+ to sun-exposed areas, reapply every 2 hours as needed.  - Recommend staying in the shade or wearing long sleeves, sun glasses (UVA+UVB protection) and wide brim hats (4-inch brim around the entire circumference of the hat). - Call for new or changing lesions.  Return for as scheduled.  I, Sonya Hupman, RMA, am acting as scribe  for Sarina Ser, MD . Documentation: I have reviewed the above documentation for accuracy and completeness, and I agree with the above.  Sarina Ser, MD

## 2022-07-03 ENCOUNTER — Encounter: Payer: Self-pay | Admitting: Dermatology

## 2022-07-05 ENCOUNTER — Telehealth: Payer: Self-pay

## 2022-07-05 NOTE — Telephone Encounter (Signed)
Called pt and advised Dr. Nehemiah Massed recommends him starting N-acetylcysteine (NAC) 600 mg supplement three times per day to help with picking.  Advised pt I also sent to him in a mychart message./sh

## 2022-08-03 ENCOUNTER — Ambulatory Visit (INDEPENDENT_AMBULATORY_CARE_PROVIDER_SITE_OTHER): Payer: Medicare Other

## 2022-08-03 DIAGNOSIS — Z23 Encounter for immunization: Secondary | ICD-10-CM | POA: Diagnosis not present

## 2022-08-26 ENCOUNTER — Ambulatory Visit (INDEPENDENT_AMBULATORY_CARE_PROVIDER_SITE_OTHER): Payer: Medicare Other | Admitting: Dermatology

## 2022-08-26 DIAGNOSIS — L578 Other skin changes due to chronic exposure to nonionizing radiation: Secondary | ICD-10-CM | POA: Diagnosis not present

## 2022-08-26 DIAGNOSIS — L219 Seborrheic dermatitis, unspecified: Secondary | ICD-10-CM | POA: Diagnosis not present

## 2022-08-26 DIAGNOSIS — D229 Melanocytic nevi, unspecified: Secondary | ICD-10-CM

## 2022-08-26 DIAGNOSIS — Z85828 Personal history of other malignant neoplasm of skin: Secondary | ICD-10-CM | POA: Diagnosis not present

## 2022-08-26 DIAGNOSIS — L820 Inflamed seborrheic keratosis: Secondary | ICD-10-CM | POA: Diagnosis not present

## 2022-08-26 DIAGNOSIS — Z1283 Encounter for screening for malignant neoplasm of skin: Secondary | ICD-10-CM | POA: Diagnosis not present

## 2022-08-26 DIAGNOSIS — L821 Other seborrheic keratosis: Secondary | ICD-10-CM

## 2022-08-26 DIAGNOSIS — L570 Actinic keratosis: Secondary | ICD-10-CM

## 2022-08-26 DIAGNOSIS — L814 Other melanin hyperpigmentation: Secondary | ICD-10-CM

## 2022-08-26 MED ORDER — KETOCONAZOLE 2 % EX SHAM
1.0000 | MEDICATED_SHAMPOO | CUTANEOUS | 6 refills | Status: DC
Start: 2022-08-27 — End: 2023-03-02

## 2022-08-26 NOTE — Patient Instructions (Addendum)
Cryotherapy Aftercare  Wash gently with soap and water everyday.   Apply Vaseline and Band-Aid daily until healed.     Due to recent changes in healthcare laws, you may see results of your pathology and/or laboratory studies on MyChart before the doctors have had a chance to review them. We understand that in some cases there may be results that are confusing or concerning to you. Please understand that not all results are received at the same time and often the doctors may need to interpret multiple results in order to provide you with the best plan of care or course of treatment. Therefore, we ask that you please give us 2 business days to thoroughly review all your results before contacting the office for clarification. Should we see a critical lab result, you will be contacted sooner.   If You Need Anything After Your Visit  If you have any questions or concerns for your doctor, please call our main line at [phone] and press option 4 to reach your doctor's medical assistant. If no one answers, please leave a voicemail as directed and we will return your call as soon as possible. Messages left after 4 pm will be answered the following business day.   You may also send us a message via MyChart. We typically respond to MyChart messages within 1-2 business days.  For prescription refills, please ask your pharmacy to contact our office. Our fax number is [phone].  If you have an urgent issue when the clinic is closed that cannot wait until the next business day, you can page your doctor at the number below.    Please note that while we do our best to be available for urgent issues outside of office hours, we are not available 24/7.   If you have an urgent issue and are unable to reach us, you may choose to seek medical care at your doctor's office, retail clinic, urgent care center, or emergency room.  If you have a medical emergency, please immediately call 911 or go to the  emergency department.  Pager Numbers  - Dr. Kowalski: [phone]  - Dr. Moye: [phone]  - Dr. Stewart: [phone]  In the event of inclement weather, please call our main line at [phone] for an update on the status of any delays or closures.  Dermatology Medication Tips: Please keep the boxes that topical medications come in in order to help keep track of the instructions about where and how to use these. Pharmacies typically print the medication instructions only on the boxes and not directly on the medication tubes.   If your medication is too expensive, please contact our office at [phone] option 4 or send us a message through MyChart.   We are unable to tell what your co-pay for medications will be in advance as this is different depending on your insurance coverage. However, we may be able to find a substitute medication at lower cost or fill out paperwork to get insurance to cover a needed medication.   If a prior authorization is required to get your medication covered by your insurance company, please allow us 1-2 business days to complete this process.  Drug prices often vary depending on where the prescription is filled and some pharmacies may offer cheaper prices.  The website www.goodrx.com contains coupons for medications through different pharmacies. The prices here do not account for what the cost may be with help from insurance (it may be cheaper with your insurance), but the website can   give you the price if you did not use any insurance.  - You can print the associated coupon and take it with your prescription to the pharmacy.  - You may also stop by our office during regular business hours and pick up a GoodRx coupon card.  - If you need your prescription sent electronically to a different pharmacy, notify our office through [place] MyChart or by phone at [phone] option 4.     Si Usted Necesita Algo Despus de Su Visita  Tambin puede  enviarnos un mensaje a travs de MyChart. Por lo general respondemos a los mensajes de MyChart en el transcurso de 1 a 2 das hbiles.  Para renovar recetas, por favor pida a su farmacia que se ponga en contacto con nuestra oficina. Nuestro nmero de fax es el [phone].  Si tiene un asunto urgente cuando la clnica est cerrada y que no puede esperar hasta el siguiente da hbil, puede llamar/localizar a su doctor(a) al nmero que aparece a continuacin.   Por favor, tenga en cuenta que aunque hacemos todo lo posible para estar disponibles para asuntos urgentes fuera del horario de oficina, no estamos disponibles las 24 horas del da, los 7 das de la semana.   Si tiene un problema urgente y no puede comunicarse con nosotros, puede optar por buscar atencin mdica  en el consultorio de su doctor(a), en una clnica privada, en un centro de atencin urgente o en una sala de emergencias.  Si tiene una emergencia mdica, por favor llame inmediatamente al 911 o vaya a la sala de emergencias.  Nmeros de bper  - Dr. Kowalski: [phone]  - Dra. Moye: [phone]  - Dra. Stewart: [phone]  En caso de inclemencias del tiempo, por favor llame a nuestra lnea principal al [phone] para una actualizacin sobre el [place] de cualquier retraso o cierre.  Consejos para la medicacin en dermatologa: Por favor, guarde las cajas en las que vienen los medicamentos de uso tpico para ayudarle a seguir las instrucciones sobre dnde y cmo usarlos. Las farmacias generalmente imprimen las instrucciones del medicamento slo en las cajas y no directamente en los tubos del medicamento.   Si su medicamento es muy caro, por favor, pngase en contacto con nuestra oficina llamando al [phone] y presione la opcin 4 o envenos un mensaje a travs de MyChart.   No podemos decirle cul ser su copago por los medicamentos por adelantado ya que esto es diferente dependiendo de la cobertura de su seguro.  Sin embargo, es posible que podamos encontrar un medicamento sustituto a menor costo o llenar un formulario para que el seguro cubra el medicamento que se considera necesario.   Si se requiere una autorizacin previa para que su compaa de seguros cubra su medicamento, por favor permtanos de 1 a 2 das hbiles para completar este proceso.  Los precios de los medicamentos varan con frecuencia dependiendo del lugar de dnde se surte la receta y alguna farmacias pueden ofrecer precios ms baratos.  El sitio web www.goodrx.com tiene cupones para medicamentos de diferentes farmacias. Los precios aqu no tienen en cuenta lo que podra costar con la ayuda del seguro (puede ser ms barato con su seguro), pero el sitio web puede darle el precio si no utiliz ningn seguro.  - Puede imprimir el cupn correspondiente y llevarlo con su receta a la farmacia.  - Tambin puede pasar por nuestra oficina durante el horario de atencin regular y recoger una tarjeta de cupones de GoodRx.  -   Si necesita que su receta se enve electrnicamente a una farmacia diferente, informe a nuestra oficina a travs de MyChart de [place] o por telfono llamando al [phone] y presione la opcin 4.  

## 2022-08-26 NOTE — Progress Notes (Signed)
Follow-Up Visit   Subjective  Brett Bender is a 76 y.o. male who presents for the following: Upper body skin exam (Hx of BCCs, hx of AKs) and Pruritis (Scalp, few months, itches at night). The patient presents for Upper Body Skin Exam (UBSE) for skin cancer screening and mole check.  The patient has spots, moles and lesions to be evaluated, some may be new or changing and the patient has concerns that these could be cancer.   The following portions of the chart were reviewed this encounter and updated as appropriate:   Tobacco  Allergies  Meds  Problems  Med Hx  Surg Hx  Fam Hx     Review of Systems:  No other skin or systemic complaints except as noted in HPI or Assessment and Plan.  Objective  Well appearing patient in no apparent distress; mood and affect are within normal limits.  All skin waist up examined.  face x 21 (21) Pink scaly macules  shoulders, arms, back x 18 (18) Stuck on waxy paps with erythema  Scalp Mild scale scalp   Assessment & Plan  AK (actinic keratosis) (21) face x 21 Destruction of lesion - face x 21 Complexity: simple   Destruction method: cryotherapy   Informed consent: discussed and consent obtained   Timeout:  patient name, date of birth, surgical site, and procedure verified Lesion destroyed using liquid nitrogen: Yes   Region frozen until ice ball extended beyond lesion: Yes   Outcome: patient tolerated procedure well with no complications   Post-procedure details: wound care instructions given    Related Medications fluorouracil (EFUDEX) 5 % cream Start on 12/18/21. Apply twice a day to temples and cheeks x 4 days.  Inflamed seborrheic keratosis (18) shoulders, arms, back x 18 Symptomatic, irritating, patient would like treated. Destruction of lesion - shoulders, arms, back x 18 Complexity: simple   Destruction method: cryotherapy   Informed consent: discussed and consent obtained   Timeout:  patient name, date of birth,  surgical site, and procedure verified Lesion destroyed using liquid nitrogen: Yes   Region frozen until ice ball extended beyond lesion: Yes   Outcome: patient tolerated procedure well with no complications   Post-procedure details: wound care instructions given    Seborrheic dermatitis Scalp Seborrheic Dermatitis  -  is a chronic persistent rash characterized by pinkness and scaling most commonly of the mid face but also can occur on the scalp (dandruff), ears; mid chest, mid back and groin.  It tends to be exacerbated by stress and cooler weather.  People who have neurologic disease may experience new onset or exacerbation of existing seborrheic dermatitis.  The condition is not curable but treatable and can be controlled.  Start Ketoconazole 2% shampoo 3x/wk, let sit 5 minutes and rinse out  ketoconazole (NIZORAL) 2 % shampoo - Scalp Apply 1 Application topically 3 (three) times a week. Wash scalp 3 times a week, let sit 5 minutes and rinse out  Lentigines - Scattered tan macules - Due to sun exposure - Benign-appearing, observe - Recommend daily broad spectrum sunscreen SPF 30+ to sun-exposed areas, reapply every 2 hours as needed. - Call for any changes  Seborrheic Keratoses - Stuck-on, waxy, tan-brown papules and/or plaques  - Benign-appearing - Discussed benign etiology and prognosis. - Observe - Call for any changes  Melanocytic Nevi - Tan-brown and/or pink-flesh-colored symmetric macules and papules - Benign appearing on exam today - Observation - Call clinic for new or changing moles - Recommend  daily use of broad spectrum spf 30+ sunscreen to sun-exposed areas.   Hemangiomas - Red papules - Discussed benign nature - Observe - Call for any changes  Actinic Damage - Chronic condition, secondary to cumulative UV/sun exposure - diffuse scaly erythematous macules with underlying dyspigmentation - Recommend daily broad spectrum sunscreen SPF 30+ to sun-exposed  areas, reapply every 2 hours as needed.  - Staying in the shade or wearing long sleeves, sun glasses (UVA+UVB protection) and wide brim hats (4-inch brim around the entire circumference of the hat) are also recommended for sun protection.  - Call for new or changing lesions.  Skin cancer screening performed today.   History of Basal Cell Carcinoma of the Skin - No evidence of recurrence today - Recommend regular full body skin exams - Recommend daily broad spectrum sunscreen SPF 30+ to sun-exposed areas, reapply every 2 hours as needed.  - Call if any new or changing lesions are noted between office visits  - multiple  Return in about 6 months (around 02/25/2023) for AK f/u.  I, Othelia Pulling, RMA, am acting as scribe for Sarina Ser, MD . Documentation: I have reviewed the above documentation for accuracy and completeness, and I agree with the above.  Sarina Ser, MD

## 2022-09-05 ENCOUNTER — Encounter: Payer: Self-pay | Admitting: Dermatology

## 2022-09-14 ENCOUNTER — Other Ambulatory Visit: Payer: Self-pay | Admitting: Family Medicine

## 2022-09-14 DIAGNOSIS — I10 Essential (primary) hypertension: Secondary | ICD-10-CM

## 2022-10-04 ENCOUNTER — Other Ambulatory Visit: Payer: Self-pay | Admitting: Family Medicine

## 2022-10-14 ENCOUNTER — Other Ambulatory Visit: Payer: Self-pay | Admitting: Family Medicine

## 2022-10-14 NOTE — Telephone Encounter (Signed)
Requested Prescriptions  Pending Prescriptions Disp Refills   diclofenac (VOLTAREN) 50 MG EC tablet [Pharmacy Med Name: DICLOFENAC SODIUM 50 MG DR TAB] 180 tablet 0    Sig: TAKE 1 TABLET BY MOUTH TWICE DAILY AS NEEDED     Analgesics:  NSAIDS Failed - 10/14/2022  9:31 AM      Failed - Manual Review: Labs are only required if the patient has taken medication for more than 8 weeks.      Passed - Cr in normal range and within 360 days    Creatinine  Date Value Ref Range Status  02/27/2012 0.93 0.60 - 1.30 mg/dL Final   Creatinine, Ser  Date Value Ref Range Status  01/12/2022 1.23 0.76 - 1.27 mg/dL Final         Passed - HGB in normal range and within 360 days    Hemoglobin  Date Value Ref Range Status  01/12/2022 16.4 13.0 - 17.7 g/dL Final         Passed - PLT in normal range and within 360 days    Platelets  Date Value Ref Range Status  01/12/2022 303 150 - 450 x10E3/uL Final         Passed - HCT in normal range and within 360 days    Hematocrit  Date Value Ref Range Status  01/12/2022 48.7 37.5 - 51.0 % Final         Passed - eGFR is 30 or above and within 360 days    EGFR (African American)  Date Value Ref Range Status  02/27/2012 >60  Final   GFR calc Af Amer  Date Value Ref Range Status  12/31/2020 67 >59 mL/min/1.73 Final    Comment:    **In accordance with recommendations from the NKF-ASN Task force,**   [facility] is in the process of updating its eGFR calculation to the   2021 CKD-EPI creatinine equation that estimates kidney function   without a race variable.    EGFR (Non-African Amer.)  Date Value Ref Range Status  02/27/2012 >60  Final    Comment:    eGFR values <72m/min/1.73 m2 may be an indication of chronic kidney disease (CKD). Calculated eGFR is useful in patients with stable renal function. The eGFR calculation will not be reliable in acutely ill patients when serum creatinine is changing rapidly. It is not useful in  patients on dialysis. The  eGFR calculation may not be applicable to patients at the low and high extremes of body sizes, pregnant women, and vegetarians.    GFR calc non Af Amer  Date Value Ref Range Status  12/31/2020 58 (L) >59 mL/min/1.73 Final   eGFR  Date Value Ref Range Status  01/12/2022 61 >59 mL/min/1.73 Final         Passed - Patient is not pregnant      Passed - Valid encounter within last 12 months    Recent Outpatient Visits           9 months [facility]   [facility]   1 year ago [facility]   [facility]   2 years ago [facility]primary[facility]       Future Appointments  In 5 months Ralene Bathe, MD [place]   In 7 months McGowan, Gordan Payment [place]

## 2022-10-20 DIAGNOSIS — H401131 Primary open-angle glaucoma, bilateral, mild stage: Secondary | ICD-10-CM | POA: Diagnosis not present

## 2022-10-20 DIAGNOSIS — H2513 Age-related nuclear cataract, bilateral: Secondary | ICD-10-CM | POA: Diagnosis not present

## 2022-12-20 ENCOUNTER — Other Ambulatory Visit: Payer: Self-pay | Admitting: Family Medicine

## 2022-12-20 DIAGNOSIS — I10 Essential (primary) hypertension: Secondary | ICD-10-CM

## 2022-12-21 NOTE — Telephone Encounter (Signed)
Requested Prescriptions  Pending Prescriptions Disp Refills   amLODipine (NORVASC) 10 MG tablet [Pharmacy Med Name: AMLODIPINE BESYLATE 10 MG TAB] 90 tablet 0    Sig: TAKE 1 TABLET BY MOUTH ONCE A DAY     Cardiovascular: Calcium Channel Blockers 2 Failed - 12/20/2022  9:33 AM      Failed - Last BP in normal range    BP Readings from Last 1 Encounters:  06/09/22 (!) 142/78         Failed - Valid encounter within last 6 months    Recent Outpatient Visits           11 months ago Prostate cancer screening   [place] Birdie Sons, MD   1 year ago [place] Vigg, Avanti, MD   1 year ago Essential (primary) hypertension   [place], Donald E, MD   1 year ago Essential (primary) hypertension   [place], Donald E, MD   2 years ago Essential (primary) hypertension   [place], Donald E, MD       Future Appointments             In 2 months Ralene Bathe, MD [place]   In 5 months McGowan, Gordan Payment [facility] [place]            Passed - Last Heart Rate in normal range    Pulse Readings from Last 1 Encounters:  06/09/22 (!) 52

## 2023-01-04 ENCOUNTER — Ambulatory Visit (INDEPENDENT_AMBULATORY_CARE_PROVIDER_SITE_OTHER): Payer: Medicare Other

## 2023-01-04 VITALS — BP 118/74 | Ht 76.0 in | Wt 207.3 lb

## 2023-01-04 DIAGNOSIS — Z0000 Encounter for general adult medical examination without abnormal findings: Secondary | ICD-10-CM | POA: Diagnosis not present

## 2023-01-04 NOTE — Patient Instructions (Signed)
Brett Bender , Thank you for taking time to come for your Medicare Wellness Visit. I appreciate your ongoing commitment to your health goals. Please review the following plan we discussed and let me know if I can assist you in the future.   These are the goals we discussed:  Goals      Cut out extra servings     Recommend to cut out extra servings after dinner. Advised if going to have a snack to make it healthy, no sweets.      DIET - EAT MORE FRUITS AND VEGETABLES        This is a list of the screening recommended for you and due dates:  Health Maintenance  Topic Date Due   DTaP/Tdap/Td vaccine (2 - Td or Tdap) 12/21/2021   COVID-19 Vaccine (4 - 2023-24 season) 07/09/2022   Medicare Annual Wellness Visit  01/05/2024   Pneumonia Vaccine  Completed   Flu Shot  Completed   Hepatitis C Screening: USPSTF Recommendation to screen - Ages 18-79 yo.  Completed   Zoster (Shingles) Vaccine  Completed   HPV Vaccine  Aged Out   Colon Cancer Screening  Discontinued    Advanced directives: yes  Conditions/risks identified: none  Next appointment: Follow up in one year for your annual wellness visit. 01/10/2024 '@11'$ :15am in-person  Preventive Care 65 Years and Older, Male  Preventive care refers to lifestyle choices and visits with your health care provider that can promote health and wellness. What does preventive care include? A yearly physical exam. This is also called an annual well check. Dental exams once or twice a year. Routine eye exams. Ask your health care provider how often you should have your eyes checked. Personal lifestyle choices, including: Daily care of your teeth and gums. Regular physical activity. Eating a healthy diet. Avoiding tobacco and drug use. Limiting alcohol use. Practicing safe sex. Taking low doses of aspirin every day. Taking vitamin and mineral supplements as recommended by your health care provider. What happens during an annual well check? The  services and screenings done by your health care provider during your annual well check will depend on your age, overall health, lifestyle risk factors, and family history of disease. Counseling  Your health care provider may ask you questions about your: Alcohol use. Tobacco use. Drug use. Emotional well-being. Home and relationship well-being. Sexual activity. Eating habits. History of falls. Memory and ability to understand (cognition). Work and work Statistician. Screening  You may have the following tests or measurements: Height, weight, and BMI. Blood pressure. Lipid and cholesterol levels. These may be checked every 5 years, or more frequently if you are over 7 years old. Skin check. Lung cancer screening. You may have this screening every year starting at age 14 if you have a 30-pack-year history of smoking and currently smoke or have quit within the past 15 years. Fecal occult blood test (FOBT) of the stool. You may have this test every year starting at age 40. Flexible sigmoidoscopy or colonoscopy. You may have a sigmoidoscopy every 5 years or a colonoscopy every 10 years starting at age 30. Prostate cancer screening. Recommendations will vary depending on your family history and other risks. Hepatitis C blood test. Hepatitis B blood test. Sexually transmitted disease (STD) testing. Diabetes screening. This is done by checking your blood sugar (glucose) after you have not eaten for a while (fasting). You may have this done every 1-3 years. Abdominal aortic aneurysm (AAA) screening. You may need this if  you are a current or former smoker. Osteoporosis. You may be screened starting at age 59 if you are at high risk. Talk with your health care provider about your test results, treatment options, and if necessary, the need for more tests. Vaccines  Your health care provider may recommend certain vaccines, such as: Influenza vaccine. This is recommended every year. Tetanus,  diphtheria, and acellular pertussis (Tdap, Td) vaccine. You may need a Td booster every 10 years. Zoster vaccine. You may need this after age 61. Pneumococcal 13-valent conjugate (PCV13) vaccine. One dose is recommended after age 29. Pneumococcal polysaccharide (PPSV23) vaccine. One dose is recommended after age 68. Talk to your health care provider about which screenings and vaccines you need and how often you need them. This information is not intended to replace advice given to you by your health care provider. Make sure you discuss any questions you have with your health care provider. Document Released: 11/21/2015 Document Revised: 07/14/2016 Document Reviewed: 08/26/2015 Elsevier Interactive Patient Education  2017 [place] Prevention in the Home Falls can cause injuries. They can happen to people of all ages. There are many things you can do to make your home safe and to help prevent falls. What can I do on the outside of my home? Regularly fix the edges of walkways and driveways and fix any cracks. Remove anything that might make you trip as you walk through a door, such as a raised step or threshold. Trim any bushes or trees on the path to your home. Use bright outdoor lighting. Clear any walking paths of anything that might make someone trip, such as rocks or tools. Regularly check to see if handrails are loose or broken. Make sure that both sides of any steps have handrails. Any raised decks and porches should have guardrails on the edges. Have any leaves, snow, or ice cleared regularly. Use sand or salt on walking paths during winter. Clean up any spills in your garage right away. This includes oil or grease spills. What can I do in the bathroom? Use night lights. Install grab bars by the toilet and in the tub and shower. Do not use towel bars as grab bars. Use non-skid mats or decals in the tub or shower. If you need to sit down in the shower, use a plastic,  non-slip stool. Keep the floor dry. Clean up any water that spills on the floor as soon as it happens. Remove soap buildup in the tub or shower regularly. Attach bath mats securely with double-sided non-slip rug tape. Do not have throw rugs and other things on the floor that can make you trip. What can I do in the bedroom? Use night lights. Make sure that you have a light by your bed that is easy to reach. Do not use any sheets or blankets that are too big for your bed. They should not hang down onto the floor. Have a firm chair that has side arms. You can use this for support while you get dressed. Do not have throw rugs and other things on the floor that can make you trip. What can I do in the kitchen? Clean up any spills right away. Avoid walking on wet floors. Keep items that you use a lot in easy-to-reach places. If you need to reach something above you, use a strong step stool that has a grab bar. Keep electrical cords out of the way. Do not use floor polish or wax that makes floors slippery. If  you must use wax, use non-skid floor wax. Do not have throw rugs and other things on the floor that can make you trip. What can I do with my stairs? Do not leave any items on the stairs. Make sure that there are handrails on both sides of the stairs and use them. Fix handrails that are broken or loose. Make sure that handrails are as long as the stairways. Check any carpeting to make sure that it is firmly attached to the stairs. Fix any carpet that is loose or worn. Avoid having throw rugs at the top or bottom of the stairs. If you do have throw rugs, attach them to the floor with carpet tape. Make sure that you have a light switch at the top of the stairs and the bottom of the stairs. If you do not have them, ask someone to add them for you. What else can I do to help prevent falls? Wear shoes that: Do not have high heels. Have rubber bottoms. Are comfortable and fit you well. Are closed  at the toe. Do not wear sandals. If you use a stepladder: Make sure that it is fully opened. Do not climb a closed stepladder. Make sure that both sides of the stepladder are locked into place. Ask someone to hold it for you, if possible. Clearly mark and make sure that you can see: Any grab bars or handrails. First and last steps. Where the edge of each step is. Use tools that help you move around (mobility aids) if they are needed. These include: Canes. Walkers. Scooters. Crutches. Turn on the lights when you go into a dark area. Replace any light bulbs as soon as they burn out. Set up your furniture so you have a clear path. Avoid moving your furniture around. If any of your floors are uneven, fix them. If there are any pets around you, be aware of where they are. Review your medicines with your doctor. Some medicines can make you feel dizzy. This can increase your chance of falling. Ask your doctor what other things that you can do to help prevent falls. This information is not intended to replace advice given to you by your health care provider. Make sure you discuss any questions you have with your health care provider. Document Released: 08/21/2009 Document Revised: 04/01/2016 Document Reviewed: 11/29/2014 Elsevier Interactive Patient Education  2017 [employer].

## 2023-01-04 NOTE — Progress Notes (Signed)
Subjective:   Brett Bender is a 77 y.o. male who presents for Medicare Annual/Subsequent preventive examination.  Review of Systems    Cardiac Risk Factors include: advanced age (>63mn, >>7women)     Objective:    Today's Vitals   01/04/23 1303  BP: 118/74  Weight: 207 lb 4.8 oz (94 kg)  Height: '6\' 4"'$  (1.93 m)   Body mass index is 25.23 kg/m.     01/04/2023    1:16 PM 12/31/2021    1:15 PM 12/29/2020    1:26 PM 01/06/2020    1:00 PM 01/03/2020    9:05 PM 01/03/2020    1:41 PM 12/27/2019    9:08 AM  Advanced Directives  Does Patient Have a Medical Advance Directive? Yes No Yes Yes Yes Yes Yes  Type of [place]aramed[place]of [place]  [place] will Living will Living will Living will H[place]Living will  Does patient want to make changes to medical advance directive?    No - Patient declined     Copy of HH[place]in Chart? No - copy requested  Yes - validated most recent copy scanned in chart (See row information)  Yes - validated most recent copy scanned in chart (See row information)  Yes - validated most recent copy scanned in chart (See row information)  Would patient like information on creating a medical advance directive?  No - Patient declined   No - Patient declined No - Patient declined     Current Medications (verified) Outpatient Encounter Medications as of 01/04/2023  Medication Sig   ALPRAZolam (XANAX) 0.5 MG tablet Take 0.5 mg by mouth at bedtime as needed for anxiety.   amLODipine (NORVASC) 10 MG tablet TAKE 1 TABLET BY MOUTH ONCE A DAY   aspirin 81 MG chewable tablet Chew by mouth daily.   atenolol-chlorthalidone (TENORETIC) 50-25 MG tablet TAKE 1/2 TABLET BY MOUTH ONCE DAILY   butalbital-acetaminophen-caffeine (BAC) 50-325-40 MG tablet TAKE 1 OR 2 TABLETS BY MOUTH EVERY 6 HOURS AS NEEDED FOR HEADACHE   diclofenac (VOLTAREN) 50 MG EC tablet TAKE 1 TABLET BY MOUTH  TWICE DAILY AS NEEDED   diclofenac Sodium (VOLTAREN) 1 % GEL APPLY 4G TOPICALLY FIVE TIMES DAILY   fexofenadine (ALLEGRA ALLERGY) 180 MG tablet Take 1 tablet (180 mg total) by mouth daily.   finasteride (PROSCAR) 5 MG tablet Take 1 tablet (5 mg total) by mouth daily.   fluorouracil (EFUDEX) 5 % cream Start on 12/18/21. Apply twice a day to temples and cheeks x 4 days.   ketoconazole (NIZORAL) 2 % shampoo Apply 1 Application topically 3 (three) times a week. Wash scalp 3 times a week, let sit 5 minutes and rinse out   latanoprost (XALATAN) 0.005 % ophthalmic solution Place 1 drop into both eyes at bedtime.    mirtazapine (REMERON) 30 MG tablet TAKE 1 TABLET BY MOUTH EVERY NIGHT AT BEDTIME   mometasone (ELOCON) 0.1 % cream Apply 1 application. topically daily as needed (Rash). Qd up to 5 days a week to itchy bumps on body   Multiple Vitamin (MULTIVITAMIN) tablet Take 1 tablet by mouth daily.   mupirocin ointment (BACTROBAN) 2 % Apply 1 application. topically daily. Qd to open sores   Probiotic Product (DIGESTIVE ADVANTAGE PO) Take 1 tablet by mouth daily.   psyllium (METAMUCIL) 58.6 % powder Take 1 packet by mouth daily as needed (regularity).    tamsulosin (FLOMAX) 0.4 MG CAPS capsule  Take 1 capsule (0.4 mg total) by mouth daily.   timolol (TIMOPTIC) 0.5 % ophthalmic solution Place 1 drop into both eyes daily.    No facility-administered encounter medications on file as of 01/04/2023.    Allergies (verified) Penicillins   History: Past Medical History:  Diagnosis Date   Actinic keratosis    Arthritis    Basal cell carcinoma 11/09/2013   R lateral edge of clavicle   Basal cell carcinoma 02/03/2016   L lateral supraclavicular base of neck/excision   Basal cell carcinoma 11/29/2018   L upper back   Basal cell carcinoma 09/11/2020   R mid back    Cancer (HCC)    Basal cell carcinoma bilateral arms and left shoulder   Complication of anesthesia    slow to wake up with shoulder surgery    Depression    Enlarged prostate    H/O elbow surgery    left   Herpes simplex    History of kidney stones    History of MRSA infection 2004   skin around eye   Hypertension    Kidney stone    Lumbar stenosis    Migraine headache    Motion sickness    ocean boats   PONV (postoperative nausea and vomiting)    Tendonitis of ankle, left    Past Surgical History:  Procedure Laterality Date   APPENDECTOMY     back injection     BACK SURGERY     CYSTOSCOPY/URETEROSCOPY/HOLMIUM LASER/STENT PLACEMENT Left 12/18/2015   Procedure: CYSTOSCOPY/URETEROSCOPY/HOLMIUM LASER LITHOTRIPSY;  Surgeon: Hollice Espy, MD;  Location: [facility];  Service: Urology;  Laterality: Left;   ELBOW SURGERY     LOWER BACK SURGERY  2010   L4-5 fusion   LUMBAR LAMINECTOMY/DECOMPRESSION MICRODISCECTOMY N/A 08/21/2018   Procedure: LUMBAR LAMINECTOMY/DECOMPRESSION MICRODISCECTOMY 1 LEVEL-L3/4;  Surgeon: Deetta Perla, MD;  Location: [facility];  Service: Neurosurgery;  Laterality: N/A;   NECK SURGERY     Upper neck surgery; with wires placed   SHOULDER SURGERY Right 11/10/2011   arthroscopic surgery . Dr. Tamala Julian, [place] Left 12/04/2019   Procedure: TRICEPS TENDON REPAIR, OLECRANON BURSA;  Surgeon: Leim Fabry, MD;  Location: [place];  Service: Orthopedics;  Laterality: Left;   Family History  Problem Relation Age of Onset   Depression Son    Diabetes Neg Hx    Cancer Neg Hx    Heart attack Neg Hx    Kidney disease Neg Hx    Prostate cancer Neg Hx    Kidney cancer Neg Hx    Bladder Cancer Neg Hx    Social History   Socioeconomic History   Marital status: Married    Spouse name: Not on file   Number of children: 2   Years of education: Not on file   Highest education level: Associate degree: occupational, Hotel manager, or vocational program  Occupational History   Occupation: Retired    Comment: former Investment banker, corporate  Tobacco Use   Smoking  status: Never   Smokeless tobacco: Never  Scientific laboratory technician Use: Never used  Substance and Sexual Activity   Alcohol use: No    Alcohol/week: 0.0 standard drinks of alcohol   Drug use: No   Sexual activity: Yes  Other Topics Concern   Not on file  Social History Narrative   Not on file   Social Determinants of Health   Financial Resource Strain: Low Risk  (01/04/2023)  Overall Financial Resource Strain (CARDIA)    Difficulty of Paying Living Expenses: Not hard at all  Food Insecurity: No Food Insecurity (01/04/2023)   Hunger Vital Sign    Worried About Running Out of Food in the Last Year: Never true    Ran Out of Food in the Last Year: Never true  Transportation Needs: No Transportation Needs (01/04/2023)   PRAPARE - Hydrologist (Medical): No    Lack of Transportation (Non-Medical): No  Physical Activity: Sufficiently Active (01/04/2023)   Exercise Vital Sign    Days of Exercise per Week: 5 days    Minutes of Exercise per Session: 90 min  Stress: No Stress Concern Present (01/04/2023)   [place]    Feeling of Stress : Not at all  Social Connections: Moderately Integrated (01/04/2023)   Social Connection and Isolation Panel [NHANES]    Frequency of Communication with Friends and Family: More than three times a week    Frequency of Social Gatherings with Friends and Family: More than three times a week    Attends Religious Services: More than 4 times per year    Active Member of [employer] or Organizations: No    Attends Music therapist: Never    Marital Status: Married    Tobacco Counseling Counseling given: Not Answered   Clinical Intake:  Pre-visit preparation completed: Yes  Pain : No/denies pain     BMI - recorded: 25.23 Nutritional Status: BMI 25 -29 Overweight Nutritional Risks: None Diabetes: No  How often do you need to have someone help you when  you read instructions, pamphlets, or other written materials from your doctor or pharmacy?: 1 - Never  Diabetic?no  Interpreter Needed?: No  Information entered by :: B.Avie Checo,LPN   Activities of Daily Living    01/04/2023    1:17 PM  In your present state of health, do you have any difficulty performing the following activities:  Hearing? 0  Vision? 0  Difficulty concentrating or making decisions? 0  Walking or climbing stairs? 0  Dressing or bathing? 0  Doing errands, shopping? 0  Preparing Food and eating ? N  Using the Toilet? N  In the past six months, have you accidently leaked urine? N  Do you have problems with loss of bowel control? N  Managing your Medications? N  Managing your Finances? N  Housekeeping or managing your Housekeeping? N    Patient Care Team: Birdie Sons, MD as PCP - General (Family Medicine) Laneta Simmers as Physician Assistant (Urology) Odette Fraction as Consulting Physician (Optometry) Ralene Bathe, MD (Dermatology)  Indicate any recent Medical Services you may have received from other than [facility] providers in the past year (date may be approximate).     Assessment:   This is a routine wellness examination for Elry.  Hearing/Vision screen Hearing Screening - Comments:: Adequate hearing Vision Screening - Comments:: Adequate vision w/glasses [facility]  Dietary issues and exercise activities discussed: Current Exercise Habits: Structured exercise class, Type of exercise: walking;strength training/weights;exercise ball, Time (Minutes): > 60, Frequency (Times/Week): 5, Weekly Exercise (Minutes/Week): 0, Intensity: Mild, Exercise limited by: None identified   Goals Addressed             This Visit's Progress    Cut out extra servings   On track    Recommend to cut out extra servings after dinner. Advised if going to have a  snack to make it healthy, no sweets.      DIET - EAT MORE FRUITS AND VEGETABLES    On track      Depression Screen    01/04/2023    1:13 PM 12/31/2021    1:14 PM 05/28/2021    1:23 PM 04/28/2021   10:23 AM 06/30/2020   10:21 AM 01/01/2020   10:26 AM 12/27/2019    9:07 AM  PHQ 2/9 Scores  PHQ - 2 Score 0 0 0 0 0 1 0  PHQ- 9 Score    0 0 1     Fall Risk    01/04/2023    1:09 PM 12/31/2021    1:16 PM 05/28/2021    1:23 PM 04/28/2021   10:22 AM 12/29/2020    1:26 PM  Fall Risk   Falls in the past year? 0 0 0 0 0  Number falls in past yr: 0 0 0 0 0  Injury with Fall? 0 0 0 0 0  Risk for fall due to : No Fall Risks No Fall Risks No Fall Risks No Fall Risks   Follow up Education provided;Falls prevention discussed Falls evaluation completed Falls evaluation completed Falls evaluation completed     FALL RISK PREVENTION PERTAINING TO THE HOME:  Any stairs in or around the home? Yes  If so, are there any without handrails? Yes  Home free of loose throw rugs in walkways, pet beds, electrical cords, etc? Yes  Adequate lighting in your home to reduce risk of falls? Yes   ASSISTIVE DEVICES UTILIZED TO PREVENT FALLS:  Life alert? No  Use of a cane, walker or w/c? No  Grab bars in the bathroom? Yes  Shower chair or bench in shower? Yes  Elevated toilet seat or a handicapped toilet? Yes   TIMED UP AND GO:  Was the test performed? Yes .  Length of time to ambulate 10 feet: 11 sec.   Gait steady and fast without use of assistive device  Cognitive Function:        01/04/2023    1:19 PM 12/17/2016    2:16 PM  6CIT Screen  What Year? 0 points 0 points  What month? 0 points 0 points  What time? 0 points 0 points  Count back from 20 0 points 0 points  Months in reverse 0 points 0 points  Repeat phrase 0 points 0 points  Total Score 0 points 0 points    Immunizations Immunization History  Administered Date(s) Administered   Fluad Quad(high Dose 65+) 08/03/2019, 08/26/2020, 09/09/2021, 08/03/2022   Influenza Split 09/03/2006, 08/12/2007, 07/26/2008, 08/09/2012    Influenza, High Dose Seasonal PF 08/10/2014, 08/18/2015, 08/10/2016, 07/28/2017, 08/16/2018   Influenza,inj,Quad PF,6+ Mos 07/31/2013   PFIZER(Purple Top)SARS-COV-2 Vaccination 12/26/2019, 01/16/2020, 08/14/2020   Pneumococcal Conjugate-13 11/27/2014   Pneumococcal Polysaccharide-23 12/22/2011   Tdap 12/22/2011   Zoster Recombinat (Shingrix) 05/25/2017, 07/28/2017   Zoster, Live 11/26/2011    TDAP status: Up to date  Flu Vaccine status: Up to date  Pneumococcal vaccine status: Up to date  Covid-19 vaccine status: Declined, Education has been provided regarding the importance of this vaccine but patient still declined. Advised may receive this vaccine at local pharmacy or Health Dept.or vaccine clinic. Aware to provide a copy of the vaccination record if obtained from local pharmacy or Health Dept. Verbalized acceptance and understanding.  Qualifies for Shingles Vaccine? Yes   Zostavax completed yes  Shingrix Completed?: Yes  Screening Tests Health Maintenance  Topic Date Due  DTaP/Tdap/Td (2 - Td or Tdap) 12/21/2021   COVID-19 Vaccine (4 - 2023-24 season) 07/09/2022   Medicare Annual Wellness (AWV)  01/05/2024   Pneumonia Vaccine 6+ Years old  Completed   INFLUENZA VACCINE  Completed   Hepatitis C Screening  Completed   Zoster Vaccines- Shingrix  Completed   HPV VACCINES  Aged Out   COLONOSCOPY (Pts 45-30yr Insurance coverage will need to be confirmed)  DLovelockMaintenance Due  Topic Date Due   DTaP/Tdap/Td (2 - Td or Tdap) 12/21/2021   COVID-19 Vaccine (4 - 2023-24 season) 07/09/2022    Colorectal cancer screening: No longer required.   Lung Cancer Screening: (Low Dose CT Chest recommended if Age 77-80years, 30 pack-year currently smoking OR have quit w/in 15years.) does not qualify.   Lung Cancer Screening Referral: no  Additional Screening:  Hepatitis C Screening: does not qualify; Completed yes  Vision Screening:  Recommended annual ophthalmology exams for early detection of glaucoma and other disorders of the eye. Is the patient up to date with their annual eye exam?  Yes  Who is the provider or what is the name of the office in which the patient attends annual eye exams? TFort SupplyIf pt is not established with a provider, would they like to be referred to a provider to establish care? No .   Dental Screening: Recommended annual dental exams for proper oral hygiene  Community Resource Referral / Chronic Care Management: CRR required this visit?  No   CCM required this visit?  No      Plan:     I have personally reviewed and noted the following in the patient's chart:   Medical and social history Use of alcohol, tobacco or illicit drugs  Current medications and supplements including opioid prescriptions. Patient is not currently taking opioid prescriptions. Functional ability and status Nutritional status Physical activity Advanced directives List of other physicians Hospitalizations, surgeries, and ER visits in previous 12 months Vitals Screenings to include cognitive, depression, and falls Referrals and appointments  In addition, I have reviewed and discussed with patient certain preventive protocols, quality metrics, and best practice recommendations. A written personalized care plan for preventive services as well as general preventive health recommendations were provided to patient.     BR[facility]er Shelter LPN   [id number]  Nurse Notes: pt is doing well today. His only concern is a decreased appetite he wants to confer with PCP at next appt.

## 2023-01-13 ENCOUNTER — Other Ambulatory Visit: Payer: Self-pay | Admitting: Family Medicine

## 2023-01-13 NOTE — Telephone Encounter (Signed)
Requested medication (s) are due for refill today: yes  Requested medication (s) are on the active medication list: yes  Last refill:  10/14/22  Future visit scheduled: yes  Notes to clinic:  Unable to refill per protocol due to failed labs, no updated results.      Requested Prescriptions  Pending Prescriptions Disp Refills   diclofenac (VOLTAREN) 50 MG EC tablet [Pharmacy Med Name: DICLOFENAC SODIUM DR '50MG'$  DR TABLET DR] 180 tablet 0    Sig: TAKE ONE TABLET BY MOUTH TWICE DAILY AS NEEDED     Analgesics:  NSAIDS Failed - 01/13/2023  9:43 AM      Failed - Manual Review: Labs are only required if the patient has taken medication for more than 8 weeks.      Failed - Cr in normal range and within 360 days    Creatinine  Date Value Ref Range Status  02/27/2012 0.93 0.60 - 1.30 mg/dL Final   Creatinine, Ser  Date Value Ref Range Status  01/12/2022 1.23 0.76 - 1.27 mg/dL Final         Failed - HGB in normal range and within 360 days    Hemoglobin  Date Value Ref Range Status  01/12/2022 16.4 13.0 - 17.7 g/dL Final         Failed - PLT in normal range and within 360 days    Platelets  Date Value Ref Range Status  01/12/2022 303 150 - 450 x10E3/uL Final         Failed - HCT in normal range and within 360 days    Hematocrit  Date Value Ref Range Status  01/12/2022 48.7 37.5 - 51.0 % Final         Failed - eGFR is 30 or above and within 360 days    EGFR (African American)  Date Value Ref Range Status  02/27/2012 >60  Final   GFR calc Af Amer  Date Value Ref Range Status  12/31/2020 67 >59 mL/min/1.73 Final    Comment:    **In accordance with recommendations from the NKF-ASN Task force,**   [facility] is in the process of updating its eGFR calculation to the   2021 CKD-EPI creatinine equation that estimates kidney function   without a race variable.    EGFR (Non-African Amer.)  Date Value Ref Range Status  02/27/2012 >60  Final    Comment:    eGFR values  <88m/min/1.73 m2 may be an indication of chronic kidney disease (CKD). Calculated eGFR is useful in patients with stable renal function. The eGFR calculation will not be reliable in acutely ill patients when serum creatinine is changing rapidly. It is not useful in  patients on dialysis. The eGFR calculation may not be applicable to patients at the low and high extremes of body sizes, pregnant women, and vegetarians.    GFR calc non Af Amer  Date Value Ref Range Status  12/31/2020 58 (L) >59 mL/min/1.73 Final   eGFR  Date Value Ref Range Status  01/12/2022 61 >59 mL/min/1.73 Final         Passed - Patient is not pregnant      Passed - Valid encounter within last 12 months    Recent Outpatient Visits           1 year ago Prostate cancer screening   [place]   1 year ago COVID-19   [place] [facility]   1  year ago Essential (primary) hypertension   [place], Donald E, MD   2 years ago Essential (primary) hypertension   [place], Donald E, MD   2 years ago Essential (primary) hypertension   [place], Donald E, MD       Future Appointments             In 2 months Ralene Bathe, MD [place]   In 4 months [place], [facility] [employer]

## 2023-02-07 ENCOUNTER — Ambulatory Visit (INDEPENDENT_AMBULATORY_CARE_PROVIDER_SITE_OTHER): Payer: Medicare Other | Admitting: Family Medicine

## 2023-02-07 ENCOUNTER — Emergency Department: Payer: Medicare Other

## 2023-02-07 ENCOUNTER — Inpatient Hospital Stay
Admission: EM | Admit: 2023-02-07 | Discharge: 2023-02-13 | DRG: 917 | Disposition: A | Discharge status: Home Health | Payer: Medicare Other | Attending: Osteopathic Medicine | Admitting: Osteopathic Medicine

## 2023-02-07 VITALS — BP 131/65 | HR 63 | Temp 98.6°F | Wt 206.0 lb

## 2023-02-07 DIAGNOSIS — M62830 Muscle spasm of back: Secondary | ICD-10-CM | POA: Diagnosis not present

## 2023-02-07 DIAGNOSIS — Z981 Arthrodesis status: Secondary | ICD-10-CM | POA: Diagnosis not present

## 2023-02-07 DIAGNOSIS — T50904A Poisoning by unspecified drugs, medicaments and biological substances, undetermined, initial encounter: Secondary | ICD-10-CM | POA: Diagnosis not present

## 2023-02-07 DIAGNOSIS — T40601A Poisoning by unspecified narcotics, accidental (unintentional), initial encounter: Secondary | ICD-10-CM

## 2023-02-07 DIAGNOSIS — R6520 Severe sepsis without septic shock: Secondary | ICD-10-CM | POA: Diagnosis not present

## 2023-02-07 DIAGNOSIS — R338 Other retention of urine: Secondary | ICD-10-CM | POA: Diagnosis present

## 2023-02-07 DIAGNOSIS — Z1152 Encounter for screening for COVID-19: Secondary | ICD-10-CM

## 2023-02-07 DIAGNOSIS — Z85828 Personal history of other malignant neoplasm of skin: Secondary | ICD-10-CM

## 2023-02-07 DIAGNOSIS — R0902 Hypoxemia: Secondary | ICD-10-CM | POA: Diagnosis not present

## 2023-02-07 DIAGNOSIS — R55 Syncope and collapse: Secondary | ICD-10-CM | POA: Diagnosis not present

## 2023-02-07 DIAGNOSIS — N179 Acute kidney failure, unspecified: Secondary | ICD-10-CM | POA: Diagnosis present

## 2023-02-07 DIAGNOSIS — R9431 Abnormal electrocardiogram [ECG] [EKG]: Secondary | ICD-10-CM | POA: Diagnosis not present

## 2023-02-07 DIAGNOSIS — Z79899 Other long term (current) drug therapy: Secondary | ICD-10-CM

## 2023-02-07 DIAGNOSIS — M5417 Radiculopathy, lumbosacral region: Secondary | ICD-10-CM | POA: Diagnosis not present

## 2023-02-07 DIAGNOSIS — T50901A Poisoning by unspecified drugs, medicaments and biological substances, accidental (unintentional), initial encounter: Secondary | ICD-10-CM | POA: Diagnosis not present

## 2023-02-07 DIAGNOSIS — I10 Essential (primary) hypertension: Secondary | ICD-10-CM | POA: Diagnosis present

## 2023-02-07 DIAGNOSIS — F32A Depression, unspecified: Secondary | ICD-10-CM | POA: Diagnosis present

## 2023-02-07 DIAGNOSIS — M47816 Spondylosis without myelopathy or radiculopathy, lumbar region: Secondary | ICD-10-CM | POA: Diagnosis present

## 2023-02-07 DIAGNOSIS — G9341 Metabolic encephalopathy: Secondary | ICD-10-CM | POA: Diagnosis not present

## 2023-02-07 DIAGNOSIS — R109 Unspecified abdominal pain: Secondary | ICD-10-CM

## 2023-02-07 DIAGNOSIS — R0689 Other abnormalities of breathing: Secondary | ICD-10-CM | POA: Diagnosis not present

## 2023-02-07 DIAGNOSIS — R300 Dysuria: Secondary | ICD-10-CM

## 2023-02-07 DIAGNOSIS — A4101 Sepsis due to Methicillin susceptible Staphylococcus aureus: Secondary | ICD-10-CM | POA: Diagnosis present

## 2023-02-07 DIAGNOSIS — K760 Fatty (change of) liver, not elsewhere classified: Secondary | ICD-10-CM | POA: Diagnosis not present

## 2023-02-07 DIAGNOSIS — M4316 Spondylolisthesis, lumbar region: Secondary | ICD-10-CM | POA: Diagnosis not present

## 2023-02-07 DIAGNOSIS — Z880 Allergy status to penicillin: Secondary | ICD-10-CM

## 2023-02-07 DIAGNOSIS — R7881 Bacteremia: Secondary | ICD-10-CM

## 2023-02-07 DIAGNOSIS — Z87442 Personal history of urinary calculi: Secondary | ICD-10-CM | POA: Diagnosis not present

## 2023-02-07 DIAGNOSIS — B9561 Methicillin susceptible Staphylococcus aureus infection as the cause of diseases classified elsewhere: Secondary | ICD-10-CM

## 2023-02-07 DIAGNOSIS — N200 Calculus of kidney: Secondary | ICD-10-CM | POA: Diagnosis present

## 2023-02-07 DIAGNOSIS — F419 Anxiety disorder, unspecified: Secondary | ICD-10-CM | POA: Diagnosis present

## 2023-02-07 DIAGNOSIS — Z7982 Long term (current) use of aspirin: Secondary | ICD-10-CM | POA: Diagnosis not present

## 2023-02-07 DIAGNOSIS — T40604D Poisoning by unspecified narcotics, undetermined, subsequent encounter: Secondary | ICD-10-CM | POA: Diagnosis not present

## 2023-02-07 DIAGNOSIS — G928 Other toxic encephalopathy: Secondary | ICD-10-CM | POA: Diagnosis present

## 2023-02-07 DIAGNOSIS — Z818 Family history of other mental and behavioral disorders: Secondary | ICD-10-CM

## 2023-02-07 DIAGNOSIS — M545 Low back pain, unspecified: Secondary | ICD-10-CM

## 2023-02-07 DIAGNOSIS — Y929 Unspecified place or not applicable: Secondary | ICD-10-CM

## 2023-02-07 DIAGNOSIS — I700 Atherosclerosis of aorta: Secondary | ICD-10-CM | POA: Diagnosis not present

## 2023-02-07 DIAGNOSIS — M48061 Spinal stenosis, lumbar region without neurogenic claudication: Secondary | ICD-10-CM | POA: Diagnosis not present

## 2023-02-07 DIAGNOSIS — G9340 Encephalopathy, unspecified: Secondary | ICD-10-CM | POA: Diagnosis not present

## 2023-02-07 DIAGNOSIS — Z792 Long term (current) use of antibiotics: Secondary | ICD-10-CM | POA: Diagnosis not present

## 2023-02-07 DIAGNOSIS — T402X1A Poisoning by other opioids, accidental (unintentional), initial encounter: Secondary | ICD-10-CM | POA: Diagnosis present

## 2023-02-07 DIAGNOSIS — N401 Enlarged prostate with lower urinary tract symptoms: Secondary | ICD-10-CM | POA: Diagnosis present

## 2023-02-07 DIAGNOSIS — T50991A Poisoning by other drugs, medicaments and biological substances, accidental (unintentional), initial encounter: Secondary | ICD-10-CM | POA: Diagnosis not present

## 2023-02-07 DIAGNOSIS — R350 Frequency of micturition: Secondary | ICD-10-CM | POA: Diagnosis not present

## 2023-02-07 DIAGNOSIS — A419 Sepsis, unspecified organism: Secondary | ICD-10-CM | POA: Diagnosis not present

## 2023-02-07 DIAGNOSIS — R4182 Altered mental status, unspecified: Secondary | ICD-10-CM | POA: Diagnosis not present

## 2023-02-07 DIAGNOSIS — N400 Enlarged prostate without lower urinary tract symptoms: Secondary | ICD-10-CM | POA: Diagnosis not present

## 2023-02-07 DIAGNOSIS — Z452 Encounter for adjustment and management of vascular access device: Secondary | ICD-10-CM | POA: Diagnosis not present

## 2023-02-07 DIAGNOSIS — G43909 Migraine, unspecified, not intractable, without status migrainosus: Secondary | ICD-10-CM | POA: Diagnosis not present

## 2023-02-07 DIAGNOSIS — J9811 Atelectasis: Secondary | ICD-10-CM | POA: Diagnosis not present

## 2023-02-07 DIAGNOSIS — R410 Disorientation, unspecified: Secondary | ICD-10-CM | POA: Diagnosis not present

## 2023-02-07 LAB — POCT URINALYSIS DIPSTICK
Bilirubin, UA: NEGATIVE
Blood, UA: NEGATIVE
Glucose, UA: NEGATIVE
Ketones, UA: NEGATIVE
Leukocytes, UA: NEGATIVE
Nitrite, UA: NEGATIVE
Protein, UA: POSITIVE — AB
Spec Grav, UA: 1.02 (ref 1.010–1.025)
Urobilinogen, UA: 0.2 E.U./dL
pH, UA: 6 (ref 5.0–8.0)

## 2023-02-07 LAB — CBC WITH DIFFERENTIAL/PLATELET

## 2023-02-07 MED ORDER — TIZANIDINE HCL 4 MG PO TABS
4.0000 mg | ORAL_TABLET | Freq: Three times a day (TID) | ORAL | 0 refills | Status: DC | PRN
Start: 2023-02-07 — End: 2023-02-17

## 2023-02-07 MED ORDER — LACTATED RINGERS IV BOLUS
1000.0000 mL | Freq: Once | INTRAVENOUS | Status: AC
  Administered 2023-02-07: 1000 mL via INTRAVENOUS

## 2023-02-07 NOTE — ED Provider Notes (Signed)
   [facility] Provider Note    Event Date/Time   First MD Initiated Contact with Patient 02/07/23 2328     (approximate)   History   Dysuria   HPI  Brett Bender is a 77 y.o. male who presents to the ED for evaluation of Dysuria   I review a PCP visit from earlier today patient was evaluated for 2 days of right-sided lower back pain, dysuria and muscular spasms of the back with a known history of nephrolithiasis.  UA at the clinic dipstick with positive protein but negative nitrites and leukocytes.     Physical Exam   Triage Vital Signs: ED Triage Vitals [02/07/23 2328]  Enc Vitals Group     BP 119/66     Pulse      Resp 20     Temp 97.7 F (36.5 C)     Temp Source Oral     SpO2 93 %     Weight      Height      Head Circumference      Peak Flow      Pain Score      Pain Loc      Pain Edu?      Excl. in [place]?     Most recent vital signs: Vitals:   02/07/23 2328  BP: 119/66  Resp: 20  Temp: 97.7 F (36.5 C)  SpO2: 93%    General: Awake, no distress. *** CV:  Good peripheral perfusion.  Resp:  Normal effort.  Abd:  No distention.  MSK:  No deformity noted.  Neuro:  No focal deficits appreciated. Other:     ED Results / Procedures / Treatments   Labs (all labs ordered are listed, but only abnormal results are displayed) Labs Reviewed  CULTURE, BLOOD (ROUTINE X 2)  CULTURE, BLOOD (ROUTINE X 2)  LACTIC ACID, PLASMA  LACTIC ACID, PLASMA  COMPREHENSIVE METABOLIC PANEL  CBC WITH DIFFERENTIAL/PLATELET  PROTIME-INR  APTT  URINALYSIS, W/ REFLEX TO CULTURE (INFECTION SUSPECTED)  PROCALCITONIN    EKG ***  RADIOLOGY ***  Official radiology report(s): No results found.  PROCEDURES and INTERVENTIONS:  Procedures  Medications  lactated ringers bolus 1,000 mL (has no administration in time range)     IMPRESSION / MDM / ASSESSMENT AND PLAN / ED COURSE  I reviewed the triage vital signs and the nursing  notes.  Differential diagnosis includes, but is not limited to, ***  {Patient presents with symptoms of an acute illness or injury that is potentially life-threatening.}      FINAL CLINICAL IMPRESSION(S) / ED DIAGNOSES   Final diagnoses:  None     Rx / DC Orders   ED Discharge Orders     None        Note:  This document was prepared using Dragon voice recognition software and may include unintentional dictation errors.

## 2023-02-07 NOTE — Progress Notes (Signed)
[place],acting as a scribe for Lelon Huh, MD.,have documented all relevant documentation on the behalf of Lelon Huh, MD,as directed by  Lelon Huh, MD while in the presence of Lelon Huh, MD.     Established patient visit   Patient: Brett Bender   DOB: 05-Mar-1946   77 y.o. Male  MRN: [id number] Visit Date: 02/07/2023  Today's healthcare provider: Lelon Huh, MD   No chief complaint on file.  Subjective    HPI  Patient is a 77 year old male who presents for evaluation of possible kidney stone.  He states he has had right sided back pain and decreased urine flow for 2 days  Onset of back pain was when he got out of bed on 02/05/2023. No known injury. Pain does not radiate.  Has been applying ice. He does have history of stones.  He reports his urine is darker in color. He states he took 2 Flomax this morning in an effort to help.  Medications: Outpatient Medications Prior to Visit  Medication Sig   ALPRAZolam (XANAX) 0.5 MG tablet Take 0.5 mg by mouth at bedtime as needed for anxiety.   amLODipine (NORVASC) 10 MG tablet TAKE 1 TABLET BY MOUTH ONCE A DAY   aspirin 81 MG chewable tablet Chew by mouth daily.   atenolol-chlorthalidone (TENORETIC) 50-25 MG tablet TAKE 1/2 TABLET BY MOUTH ONCE DAILY   butalbital-acetaminophen-caffeine (BAC) 50-325-40 MG tablet TAKE 1 OR 2 TABLETS BY MOUTH EVERY 6 HOURS AS NEEDED FOR HEADACHE   diclofenac (VOLTAREN) 50 MG EC tablet TAKE ONE TABLET BY MOUTH TWICE DAILY AS NEEDED   diclofenac Sodium (VOLTAREN) 1 % GEL APPLY 4G TOPICALLY FIVE TIMES DAILY   fexofenadine (ALLEGRA ALLERGY) 180 MG tablet Take 1 tablet (180 mg total) by mouth daily.   finasteride (PROSCAR) 5 MG tablet Take 1 tablet (5 mg total) by mouth daily.   fluorouracil (EFUDEX) 5 % cream Start on 12/18/21. Apply twice a day to temples and cheeks x 4 days.   ketoconazole (NIZORAL) 2 % shampoo Apply 1 Application topically 3 (three) times a week. Wash scalp 3 times a  week, let sit 5 minutes and rinse out   latanoprost (XALATAN) 0.005 % ophthalmic solution Place 1 drop into both eyes at bedtime.    mirtazapine (REMERON) 30 MG tablet TAKE 1 TABLET BY MOUTH EVERY NIGHT AT BEDTIME   mometasone (ELOCON) 0.1 % cream Apply 1 application. topically daily as needed (Rash). Qd up to 5 days a week to itchy bumps on body   Multiple Vitamin (MULTIVITAMIN) tablet Take 1 tablet by mouth daily.   mupirocin ointment (BACTROBAN) 2 % Apply 1 application. topically daily. Qd to open sores   Probiotic Product (DIGESTIVE ADVANTAGE PO) Take 1 tablet by mouth daily.   psyllium (METAMUCIL) 58.6 % powder Take 1 packet by mouth daily as needed (regularity).    tamsulosin (FLOMAX) 0.4 MG CAPS capsule Take 1 capsule (0.4 mg total) by mouth daily.   timolol (TIMOPTIC) 0.5 % ophthalmic solution Place 1 drop into both eyes daily.    No facility-administered medications prior to visit.    Review of Systems  Genitourinary:  Positive for difficulty urinating, dysuria and frequency. Negative for enuresis, flank pain and hematuria.      Objective    BP 131/65 (BP Location: Left Arm, Patient Position: Sitting, Cuff Size: Normal)   Pulse 63   Temp 98.6 F (37 C) (Oral)   Wt 206 lb (93.4 kg)   SpO2  98%   BMI 25.08 kg/m    Physical Exam  No spine tenderness. Spasm of right lower paraspinous musculature.   Results for orders placed or performed in visit on 02/07/23  POCT urinalysis dipstick  Result Value Ref Range   Color, UA amber    Clarity, UA clear    Glucose, UA Negative Negative   Bilirubin, UA neg    Ketones, UA neg    Spec Grav, UA 1.020 1.010 - 1.025   Blood, UA neg    pH, UA 6.0 5.0 - 8.0   Protein, UA Positive (A) Negative   Urobilinogen, UA 0.2 0.2 or 1.0 E.U./dL   Nitrite, UA neg    Leukocytes, UA Negative Negative   Appearance     Odor      Assessment & Plan     1. Acute right-sided low back pain without sciatica   2. Dysuria  - Urinalysis,  microscopic only - Urine Culture  3. Muscle spasm of back  - tiZANidine (ZANAFLEX) 4 MG tablet; Take 1 tablet (4 mg total) by mouth every 8 (eight) hours as needed for muscle spasms.  Dispense: 20 tablet; Refill: 0 Apply ice every 4-5 hours.   4. History of nephrolithiasis Current episode not very c/w kidney stone, although he does feels like Flomax helped.  - Urinalysis, microscopic only      The entirety of the information documented in the History of Present Illness, Review of Systems and Physical Exam were personally obtained by me. Portions of this information were initially documented by the CMA and reviewed by me for thoroughness and accuracy.     Lelon Huh, MD  [place] [phone] (phone) [phone] (fax)  [place]

## 2023-02-08 ENCOUNTER — Emergency Department: Payer: Medicare Other

## 2023-02-08 DIAGNOSIS — A419 Sepsis, unspecified organism: Secondary | ICD-10-CM | POA: Diagnosis present

## 2023-02-08 DIAGNOSIS — B9561 Methicillin susceptible Staphylococcus aureus infection as the cause of diseases classified elsewhere: Secondary | ICD-10-CM | POA: Diagnosis not present

## 2023-02-08 DIAGNOSIS — N401 Enlarged prostate with lower urinary tract symptoms: Secondary | ICD-10-CM | POA: Diagnosis present

## 2023-02-08 DIAGNOSIS — R55 Syncope and collapse: Secondary | ICD-10-CM | POA: Diagnosis not present

## 2023-02-08 DIAGNOSIS — M4316 Spondylolisthesis, lumbar region: Secondary | ICD-10-CM | POA: Diagnosis not present

## 2023-02-08 DIAGNOSIS — T50901A Poisoning by unspecified drugs, medicaments and biological substances, accidental (unintentional), initial encounter: Secondary | ICD-10-CM

## 2023-02-08 DIAGNOSIS — R6520 Severe sepsis without septic shock: Secondary | ICD-10-CM | POA: Diagnosis not present

## 2023-02-08 DIAGNOSIS — N200 Calculus of kidney: Secondary | ICD-10-CM | POA: Diagnosis present

## 2023-02-08 DIAGNOSIS — G9340 Encephalopathy, unspecified: Secondary | ICD-10-CM | POA: Diagnosis not present

## 2023-02-08 DIAGNOSIS — M5417 Radiculopathy, lumbosacral region: Secondary | ICD-10-CM | POA: Diagnosis present

## 2023-02-08 DIAGNOSIS — Z87442 Personal history of urinary calculi: Secondary | ICD-10-CM | POA: Diagnosis not present

## 2023-02-08 DIAGNOSIS — Y929 Unspecified place or not applicable: Secondary | ICD-10-CM | POA: Diagnosis not present

## 2023-02-08 DIAGNOSIS — Z981 Arthrodesis status: Secondary | ICD-10-CM | POA: Diagnosis not present

## 2023-02-08 DIAGNOSIS — Z7982 Long term (current) use of aspirin: Secondary | ICD-10-CM | POA: Diagnosis not present

## 2023-02-08 DIAGNOSIS — I10 Essential (primary) hypertension: Secondary | ICD-10-CM | POA: Diagnosis present

## 2023-02-08 DIAGNOSIS — G928 Other toxic encephalopathy: Secondary | ICD-10-CM | POA: Diagnosis present

## 2023-02-08 DIAGNOSIS — T40601A Poisoning by unspecified narcotics, accidental (unintentional), initial encounter: Secondary | ICD-10-CM

## 2023-02-08 DIAGNOSIS — R109 Unspecified abdominal pain: Secondary | ICD-10-CM | POA: Diagnosis not present

## 2023-02-08 DIAGNOSIS — R7881 Bacteremia: Secondary | ICD-10-CM

## 2023-02-08 DIAGNOSIS — Z1152 Encounter for screening for COVID-19: Secondary | ICD-10-CM | POA: Diagnosis not present

## 2023-02-08 DIAGNOSIS — N400 Enlarged prostate without lower urinary tract symptoms: Secondary | ICD-10-CM | POA: Diagnosis not present

## 2023-02-08 DIAGNOSIS — R350 Frequency of micturition: Secondary | ICD-10-CM | POA: Diagnosis not present

## 2023-02-08 DIAGNOSIS — G9341 Metabolic encephalopathy: Secondary | ICD-10-CM | POA: Insufficient documentation

## 2023-02-08 DIAGNOSIS — R4182 Altered mental status, unspecified: Secondary | ICD-10-CM | POA: Diagnosis not present

## 2023-02-08 DIAGNOSIS — M48061 Spinal stenosis, lumbar region without neurogenic claudication: Secondary | ICD-10-CM | POA: Diagnosis not present

## 2023-02-08 DIAGNOSIS — K760 Fatty (change of) liver, not elsewhere classified: Secondary | ICD-10-CM | POA: Diagnosis not present

## 2023-02-08 DIAGNOSIS — M545 Low back pain, unspecified: Secondary | ICD-10-CM | POA: Diagnosis not present

## 2023-02-08 DIAGNOSIS — F32A Depression, unspecified: Secondary | ICD-10-CM | POA: Diagnosis present

## 2023-02-08 DIAGNOSIS — A4101 Sepsis due to Methicillin susceptible Staphylococcus aureus: Secondary | ICD-10-CM | POA: Diagnosis present

## 2023-02-08 DIAGNOSIS — Z85828 Personal history of other malignant neoplasm of skin: Secondary | ICD-10-CM | POA: Diagnosis not present

## 2023-02-08 DIAGNOSIS — Z792 Long term (current) use of antibiotics: Secondary | ICD-10-CM | POA: Diagnosis not present

## 2023-02-08 DIAGNOSIS — Z79899 Other long term (current) drug therapy: Secondary | ICD-10-CM | POA: Diagnosis not present

## 2023-02-08 DIAGNOSIS — M47816 Spondylosis without myelopathy or radiculopathy, lumbar region: Secondary | ICD-10-CM | POA: Diagnosis present

## 2023-02-08 DIAGNOSIS — F419 Anxiety disorder, unspecified: Secondary | ICD-10-CM | POA: Diagnosis present

## 2023-02-08 DIAGNOSIS — G43909 Migraine, unspecified, not intractable, without status migrainosus: Secondary | ICD-10-CM | POA: Diagnosis not present

## 2023-02-08 DIAGNOSIS — Z452 Encounter for adjustment and management of vascular access device: Secondary | ICD-10-CM | POA: Diagnosis not present

## 2023-02-08 DIAGNOSIS — T402X1A Poisoning by other opioids, accidental (unintentional), initial encounter: Secondary | ICD-10-CM | POA: Diagnosis present

## 2023-02-08 DIAGNOSIS — Z880 Allergy status to penicillin: Secondary | ICD-10-CM | POA: Diagnosis not present

## 2023-02-08 DIAGNOSIS — Z818 Family history of other mental and behavioral disorders: Secondary | ICD-10-CM | POA: Diagnosis not present

## 2023-02-08 DIAGNOSIS — J9811 Atelectasis: Secondary | ICD-10-CM | POA: Diagnosis not present

## 2023-02-08 DIAGNOSIS — N179 Acute kidney failure, unspecified: Secondary | ICD-10-CM | POA: Diagnosis present

## 2023-02-08 DIAGNOSIS — I700 Atherosclerosis of aorta: Secondary | ICD-10-CM | POA: Diagnosis not present

## 2023-02-08 DIAGNOSIS — R338 Other retention of urine: Secondary | ICD-10-CM | POA: Diagnosis present

## 2023-02-08 DIAGNOSIS — T40604D Poisoning by unspecified narcotics, undetermined, subsequent encounter: Secondary | ICD-10-CM | POA: Diagnosis not present

## 2023-02-08 HISTORY — DX: Poisoning by unspecified narcotics, accidental (unintentional), initial encounter: T40.601A

## 2023-02-08 LAB — CBC WITH DIFFERENTIAL/PLATELET
Abs Immature Granulocytes: 0.99 10*3/uL — ABNORMAL HIGH (ref 0.00–0.07)
Basophils Absolute: 0.1 10*3/uL (ref 0.0–0.1)
Basophils Relative: 0 %
Eosinophils Absolute: 0 10*3/uL (ref 0.0–0.5)
Eosinophils Relative: 0 %
HCT: 43.5 % (ref 39.0–52.0)
Hemoglobin: 14.2 g/dL (ref 13.0–17.0)
Immature Granulocytes: 4 %
Lymphocytes Relative: 5 %
Lymphs Abs: 1.2 10*3/uL (ref 0.7–4.0)
MCH: 30.6 pg (ref 26.0–34.0)
MCHC: 32.6 g/dL (ref 30.0–36.0)
MCV: 93.8 fL (ref 80.0–100.0)
Monocytes Absolute: 1.6 10*3/uL — ABNORMAL HIGH (ref 0.1–1.0)
Monocytes Relative: 7 %
Neutro Abs: 18.6 10*3/uL — ABNORMAL HIGH (ref 1.7–7.7)
Neutrophils Relative %: 84 %
Platelets: 156 10*3/uL (ref 150–400)
RBC: 4.64 MIL/uL (ref 4.22–5.81)
RDW: 14.4 % (ref 11.5–15.5)
WBC: 22.5 10*3/uL — ABNORMAL HIGH (ref 4.0–10.5)
nRBC: 0 % (ref 0.0–0.2)

## 2023-02-08 LAB — URINALYSIS, W/ REFLEX TO CULTURE (INFECTION SUSPECTED)
Bilirubin Urine: NEGATIVE
Glucose, UA: NEGATIVE mg/dL
Hgb urine dipstick: NEGATIVE
Ketones, ur: NEGATIVE mg/dL
Leukocytes,Ua: NEGATIVE
Nitrite: NEGATIVE
Protein, ur: 100 mg/dL — AB
Specific Gravity, Urine: 1.026 (ref 1.005–1.030)
pH: 5 (ref 5.0–8.0)

## 2023-02-08 LAB — TROPONIN I (HIGH SENSITIVITY)
Troponin I (High Sensitivity): 35 ng/L — ABNORMAL HIGH (ref <18)
Troponin I (High Sensitivity): 37 ng/L — ABNORMAL HIGH (ref <18)

## 2023-02-08 LAB — BLOOD CULTURE ID PANEL (REFLEXED) - BCID2

## 2023-02-08 LAB — COMPREHENSIVE METABOLIC PANEL
ALT: 22 U/L (ref 0–44)
AST: 28 U/L (ref 15–41)
Albumin: 3.4 g/dL — ABNORMAL LOW (ref 3.5–5.0)
Alkaline Phosphatase: 73 U/L (ref 38–126)
Anion gap: 11 (ref 5–15)
BUN: 52 mg/dL — ABNORMAL HIGH (ref 8–23)
CO2: 21 mmol/L — ABNORMAL LOW (ref 22–32)
Calcium: 8.6 mg/dL — ABNORMAL LOW (ref 8.9–10.3)
Chloride: 103 mmol/L (ref 98–111)
Creatinine, Ser: 1.66 mg/dL — ABNORMAL HIGH (ref 0.61–1.24)
GFR, Estimated: 42 mL/min — ABNORMAL LOW (ref >60)
Glucose, Bld: 207 mg/dL — ABNORMAL HIGH (ref 70–99)
Potassium: 3.5 mmol/L (ref 3.5–5.1)
Sodium: 135 mmol/L (ref 135–145)
Total Bilirubin: 0.9 mg/dL (ref 0.3–1.2)
Total Protein: 6.8 g/dL (ref 6.5–8.1)

## 2023-02-08 LAB — URINE DRUG SCREEN, QUALITATIVE (ARMC ONLY)
Amphetamines, Ur Screen: NOT DETECTED
Barbiturates, Ur Screen: NOT DETECTED
Benzodiazepine, Ur Scrn: NOT DETECTED
Cannabinoid 50 Ng, Ur ~~LOC~~: NOT DETECTED
Cocaine Metabolite,Ur ~~LOC~~: NOT DETECTED
MDMA (Ecstasy)Ur Screen: NOT DETECTED
Methadone Scn, Ur: NOT DETECTED
Opiate, Ur Screen: POSITIVE — AB
Phencyclidine (PCP) Ur S: NOT DETECTED
Tricyclic, Ur Screen: NOT DETECTED

## 2023-02-08 LAB — CBC
HCT: 41.3 % (ref 39.0–52.0)
Hemoglobin: 13.2 g/dL (ref 13.0–17.0)
MCH: 30.8 pg (ref 26.0–34.0)
MCHC: 32 g/dL (ref 30.0–36.0)
MCV: 96.5 fL (ref 80.0–100.0)
Platelets: 141 10*3/uL — ABNORMAL LOW (ref 150–400)
RBC: 4.28 MIL/uL (ref 4.22–5.81)
RDW: 14.6 % (ref 11.5–15.5)
WBC: 20.3 10*3/uL — ABNORMAL HIGH (ref 4.0–10.5)
nRBC: 0 % (ref 0.0–0.2)

## 2023-02-08 LAB — RESP PANEL BY RT-PCR (RSV, FLU A&B, COVID)  RVPGX2
Influenza A by PCR: NEGATIVE
Influenza B by PCR: NEGATIVE
Resp Syncytial Virus by PCR: NEGATIVE
SARS Coronavirus 2 by RT PCR: NEGATIVE

## 2023-02-08 LAB — CREATININE, SERUM
Creatinine, Ser: 1.72 mg/dL — ABNORMAL HIGH (ref 0.61–1.24)
GFR, Estimated: 41 mL/min — ABNORMAL LOW (ref >60)

## 2023-02-08 LAB — SPECIMEN STATUS REPORT

## 2023-02-08 LAB — LACTIC ACID, PLASMA
Lactic Acid, Venous: 1.4 mmol/L (ref 0.5–1.9)
Lactic Acid, Venous: 2.5 mmol/L (ref 0.5–1.9)

## 2023-02-08 LAB — CORTISOL-AM, BLOOD: Cortisol - AM: 31.5 ug/dL — ABNORMAL HIGH (ref 6.7–22.6)

## 2023-02-08 LAB — URINALYSIS, MICROSCOPIC ONLY
Bacteria, UA: NONE SEEN
Casts: NONE SEEN /lpf
RBC, Urine: NONE SEEN /hpf (ref 0–2)

## 2023-02-08 LAB — PROTIME-INR
INR: 1.2 (ref 0.8–1.2)
Prothrombin Time: 15 seconds (ref 11.4–15.2)

## 2023-02-08 LAB — APTT: aPTT: 32 seconds (ref 24–36)

## 2023-02-08 LAB — PROCALCITONIN: Procalcitonin: 2.46 ng/mL

## 2023-02-08 MED ORDER — NALOXONE HCL 2 MG/2ML IJ SOSY: 0.4000 mg | PREFILLED_SYRINGE | INTRAMUSCULAR | Status: DC | PRN

## 2023-02-08 MED ORDER — SODIUM CHLORIDE 0.9 % IV SOLN
2.0000 g | Freq: Two times a day (BID) | INTRAVENOUS | Status: DC
  Administered 2023-02-08: 2 g via INTRAVENOUS
  Filled 2023-02-08 (×3): qty 12.5

## 2023-02-08 MED ORDER — ACETAMINOPHEN 650 MG RE SUPP: 650.0000 mg | Freq: Four times a day (QID) | RECTAL | Status: DC | PRN

## 2023-02-08 MED ORDER — CEFAZOLIN SODIUM-DEXTROSE 2-4 GM/100ML-% IV SOLN
2.0000 g | Freq: Three times a day (TID) | INTRAVENOUS | Status: DC
  Administered 2023-02-08 – 2023-02-13 (×14): 2 g via INTRAVENOUS
  Filled 2023-02-08 (×16): qty 100

## 2023-02-08 MED ORDER — ACETAMINOPHEN 325 MG PO TABS
650.0000 mg | ORAL_TABLET | Freq: Four times a day (QID) | ORAL | Status: DC | PRN
  Administered 2023-02-08 – 2023-02-09 (×2): 650 mg via ORAL
  Filled 2023-02-08 (×2): qty 2

## 2023-02-08 MED ORDER — LACTATED RINGERS IV SOLN: INTRAVENOUS | Status: AC

## 2023-02-08 MED ORDER — IOHEXOL 350 MG/ML SOLN
60.0000 mL | Freq: Once | INTRAVENOUS | Status: AC | PRN
  Administered 2023-02-08: 60 mL via INTRAVENOUS

## 2023-02-08 MED ORDER — FINASTERIDE 5 MG PO TABS
5.0000 mg | ORAL_TABLET | Freq: Once | ORAL | Status: AC
  Administered 2023-02-08: 5 mg via ORAL
  Filled 2023-02-08: qty 1

## 2023-02-08 MED ORDER — ONDANSETRON HCL 4 MG PO TABS: 4.0000 mg | ORAL_TABLET | Freq: Four times a day (QID) | ORAL | Status: DC | PRN

## 2023-02-08 MED ORDER — DICLOFENAC SODIUM 1 % EX GEL
2.0000 g | Freq: Four times a day (QID) | CUTANEOUS | Status: DC | PRN
  Administered 2023-02-08 – 2023-02-13 (×11): 2 g via TOPICAL
  Filled 2023-02-08: qty 100

## 2023-02-08 MED ORDER — LACTATED RINGERS IV SOLN
150.0000 mL/h | INTRAVENOUS | Status: DC
  Administered 2023-02-08: 150 mL/h via INTRAVENOUS

## 2023-02-08 MED ORDER — METRONIDAZOLE 500 MG/100ML IV SOLN
500.0000 mg | Freq: Two times a day (BID) | INTRAVENOUS | Status: DC
  Administered 2023-02-08: 500 mg via INTRAVENOUS
  Filled 2023-02-08 (×2): qty 100

## 2023-02-08 MED ORDER — ONDANSETRON HCL 4 MG/2ML IJ SOLN: 4.0000 mg | Freq: Four times a day (QID) | INTRAMUSCULAR | Status: DC | PRN

## 2023-02-08 MED ORDER — ENOXAPARIN SODIUM 40 MG/0.4ML IJ SOSY
40.0000 mg | PREFILLED_SYRINGE | INTRAMUSCULAR | Status: DC
  Administered 2023-02-08 – 2023-02-13 (×6): 40 mg via SUBCUTANEOUS
  Filled 2023-02-08 (×6): qty 0.4

## 2023-02-08 MED ORDER — VANCOMYCIN HCL 2000 MG/400ML IV SOLN
2000.0000 mg | Freq: Once | INTRAVENOUS | Status: AC
  Administered 2023-02-08: 2000 mg via INTRAVENOUS
  Filled 2023-02-08: qty 400

## 2023-02-08 MED ORDER — VANCOMYCIN HCL 1500 MG/300ML IV SOLN: 1500.0000 mg | INTRAVENOUS | Status: DC

## 2023-02-08 MED ORDER — MIRTAZAPINE 15 MG PO TABS
30.0000 mg | ORAL_TABLET | Freq: Once | ORAL | Status: AC
  Administered 2023-02-08: 30 mg via ORAL
  Filled 2023-02-08: qty 2

## 2023-02-08 MED ORDER — TAMSULOSIN HCL 0.4 MG PO CAPS
0.4000 mg | ORAL_CAPSULE | Freq: Once | ORAL | Status: AC
  Administered 2023-02-08: 0.4 mg via ORAL
  Filled 2023-02-08: qty 1

## 2023-02-08 MED ORDER — ACETAMINOPHEN 500 MG PO TABS
1000.0000 mg | ORAL_TABLET | Freq: Once | ORAL | Status: AC
  Administered 2023-02-08: 1000 mg via ORAL
  Filled 2023-02-08: qty 2

## 2023-02-08 MED ORDER — MORPHINE SULFATE (PF) 4 MG/ML IV SOLN
4.0000 mg | Freq: Once | INTRAVENOUS | Status: AC
  Administered 2023-02-08: 4 mg via INTRAVENOUS
  Filled 2023-02-08: qty 1

## 2023-02-08 MED ORDER — GADOBUTROL 1 MMOL/ML IV SOLN
10.0000 mL | Freq: Once | INTRAVENOUS | Status: AC | PRN
  Administered 2023-02-08: 10 mL via INTRAVENOUS

## 2023-02-08 MED ORDER — SODIUM CHLORIDE 0.9 % IV SOLN
2.0000 g | Freq: Once | INTRAVENOUS | Status: AC
  Administered 2023-02-08: 2 g via INTRAVENOUS
  Filled 2023-02-08: qty 12.5

## 2023-02-08 NOTE — Assessment & Plan Note (Signed)
Suspecting acute with creatinine 1.66, most recent baseline a year ago was 1.2

## 2023-02-08 NOTE — Assessment & Plan Note (Signed)
Remeron nightly Hold alprazolam

## 2023-02-08 NOTE — Consult Note (Signed)
NAME: Brett Bender  DOB: 1946-02-02  MRN: [id number]  Date/Time: 02/08/2023 12:47 PM  REQUESTING PROVIDER: Crista Elliot Subjective:  REASON FOR CONSULT: MSSA bacteremia ? Brett Bender is a 77 y.o. male with a history of renal stones, BCC, BPH, DDD presents with latered mental status and concern for accidental over dose of pain meds and muscle relaxants. EMS gave him narcan Pt has had lower back pain and rt sided pain for a few days.  He had seen his PCP the day before presentation In the ED vitals were BP of 131/65, temperature of 98.6, heart rate 63 and pulse ox 98%. WBC was 22.5, Hb 14.2, platelet 156 and creatinine 1.66.  Blood cultures were sent CT head did not reveal any acute findings CT angio of the chest showed no intrathoracic pathology, no PE, MRI of the lumbar spine did not reveal any discitis or osteomyelitis or abscess.Marland Kitchen  UA was 0-5 WBC CT scan of the abdomen in the past had revealed multiple nonobstructive small stones in the right kidney and left.  Patient was started on broad-spectrum antibiotics Blood cultures showed Staph aureus.  I am seeing the patient for the same.  Even though there were 2 culture sent it look like it was taken in the same site. Patient has multiple wounds on his body from nervous picking of skin The one on the upper back was sore and he was given some topical antibiotic cream by by his dermatologist.  He also has tears on his lower extremity which is healing.   Past Medical History:  Diagnosis Date   Actinic keratosis    Arthritis    Basal cell carcinoma 11/09/2013   R lateral edge of clavicle   Basal cell carcinoma 02/03/2016   L lateral supraclavicular base of neck/excision   Basal cell carcinoma 11/29/2018   L upper back   Basal cell carcinoma 09/11/2020   R mid back    Cancer (HCC)    Basal cell carcinoma bilateral arms and left shoulder   Complication of anesthesia    slow to wake up with shoulder surgery   Depression    Enlarged  prostate    H/O elbow surgery    left   Herpes simplex    History of kidney stones    History of MRSA infection 2004   skin around eye   Hypertension    Kidney stone    Lumbar stenosis    Migraine headache    Motion sickness    ocean boats   PONV (postoperative nausea and vomiting)    Tendonitis of ankle, left     Past Surgical History:  Procedure Laterality Date   APPENDECTOMY     back injection     BACK SURGERY     CYSTOSCOPY/URETEROSCOPY/HOLMIUM LASER/STENT PLACEMENT Left 12/18/2015   Procedure: CYSTOSCOPY/URETEROSCOPY/HOLMIUM LASER LITHOTRIPSY;  Surgeon: Hollice Espy, MD;  Location: [facility];  Service: Urology;  Laterality: Left;   ELBOW SURGERY     LOWER BACK SURGERY  2010   L4-5 fusion   LUMBAR LAMINECTOMY/DECOMPRESSION MICRODISCECTOMY N/A 08/21/2018   Procedure: LUMBAR LAMINECTOMY/DECOMPRESSION MICRODISCECTOMY 1 LEVEL-L3/4;  Surgeon: Deetta Perla, MD;  Location: [facility];  Service: Neurosurgery;  Laterality: N/A;   NECK SURGERY     Upper neck surgery; with wires placed   SHOULDER SURGERY Right 11/10/2011   arthroscopic surgery . Dr. Tamala Julian, Lane Left 12/04/2019   Procedure: TRICEPS TENDON REPAIR, OLECRANON BURSA;  Surgeon: Leim Fabry,  MD;  Location: [place];  Service: Orthopedics;  Laterality: Left;    Social History   Socioeconomic History   Marital status: Married    Spouse name: Not on file   Number of children: 2   Years of education: Not on file   Highest education level: Associate degree: occupational, Hotel manager, or vocational program  Occupational History   Occupation: Retired    Comment: former Investment banker, corporate  Tobacco Use   Smoking status: Never   Smokeless tobacco: Never  Scientific laboratory technician Use: Never used  Substance and Sexual Activity   Alcohol use: No    Alcohol/week: 0.0 standard drinks of alcohol   Drug use: No   Sexual activity: Yes  Other Topics Concern   Not on file  Social  History Narrative   Not on file   Social Determinants of Health   Financial Resource Strain: Low Risk  (01/04/2023)   Overall Financial Resource Strain (CARDIA)    Difficulty of Paying Living Expenses: Not hard at all  Food Insecurity: No Food Insecurity (01/04/2023)   Hunger Vital Sign    Worried About Running Out of Food in the Last Year: Never true    [place] in the Last Year: Never true  Transportation Needs: No Transportation Needs (01/04/2023)   PRAPARE - Hydrologist (Medical): No    Lack of Transportation (Non-Medical): No  Physical Activity: Sufficiently Active (01/04/2023)   Exercise Vital Sign    Days of Exercise per Week: 5 days    Minutes of Exercise per Session: 90 min  Stress: No Stress Concern Present (01/04/2023)   [place]    Feeling of Stress : Not at all  Social Connections: Moderately Integrated (01/04/2023)   Social Connection and Isolation Panel [NHANES]    Frequency of Communication with Friends and Family: More than three times a week    Frequency of Social Gatherings with Friends and Family: More than three times a week    Attends Religious Services: More than 4 times per year    Active Member of [employer] or Organizations: No    Attends Archivist Meetings: Never    Marital Status: Married  Human resources officer Violence: Not At Risk (01/04/2023)   Humiliation, Afraid, Rape, and Kick questionnaire    Fear of Current or Ex-Partner: No    Emotionally Abused: No    Physically Abused: No    Sexually Abused: No    Family History  Problem Relation Age of Onset   Depression Son    Diabetes Neg Hx    Cancer Neg Hx    Heart attack Neg Hx    Kidney disease Neg Hx    Prostate cancer Neg Hx    Kidney cancer Neg Hx    Bladder Cancer Neg Hx    Allergies  Allergen Reactions   Penicillins Other (See Comments)    Stomach Ache   I? Current  Facility-Administered Medications  Medication Dose Route Frequency Provider Last Rate Last Admin   acetaminophen (TYLENOL) tablet 650 mg  650 mg Oral Q6H PRN Athena Masse, MD       Or   acetaminophen (TYLENOL) suppository 650 mg  650 mg Rectal Q6H PRN Athena Masse, MD       ceFEPIme (MAXIPIME) 2 g in sodium chloride 0.9 % 100 mL IVPB  2 g Intravenous Q12H Renda Rolls, [facility]  diclofenac Sodium (VOLTAREN) 1 % topical gel 2 g  2 g Topical QID PRN Richarda Osmond, MD       enoxaparin (LOVENOX) injection 40 mg  40 mg Subcutaneous Q24H Judd Gaudier V, MD   40 mg at 02/08/23 1100   lactated ringers infusion   Intravenous Continuous Richarda Osmond, MD 50 mL/hr at 02/08/23 0828 New Bag at 02/08/23 0828   metroNIDAZOLE (FLAGYL) IVPB 500 mg  500 mg Intravenous Q12H Athena Masse, MD   Stopped at 02/08/23 0729   naloxone [facility]) injection 0.4 mg  0.4 mg Intravenous PRN Athena Masse, MD       ondansetron [facility]) tablet 4 mg  4 mg Oral Q6H PRN Athena Masse, MD       Or   ondansetron [facility]) injection 4 mg  4 mg Intravenous Q6H PRN Athena Masse, MD       [START ON 02/09/2023] vancomycin (VANCOREADY) IVPB 1500 mg/300 mL  1,500 mg Intravenous Q24H Renda Rolls, RPH       Current Outpatient Medications  Medication Sig Dispense Refill   amLODipine (NORVASC) 10 MG tablet TAKE 1 TABLET BY MOUTH ONCE A DAY 90 tablet 0   aspirin 81 MG chewable tablet Chew by mouth daily.     atenolol-chlorthalidone (TENORETIC) 50-25 MG tablet TAKE 1/2 TABLET BY MOUTH ONCE DAILY 45 tablet 4   diclofenac Sodium (VOLTAREN) 1 % GEL APPLY 4G TOPICALLY FIVE TIMES DAILY 100 g 4   fexofenadine (ALLEGRA ALLERGY) 180 MG tablet Take 1 tablet (180 mg total) by mouth daily. 10 tablet 1   finasteride (PROSCAR) 5 MG tablet Take 1 tablet (5 mg total) by mouth daily. 90 tablet 3   latanoprost (XALATAN) 0.005 % ophthalmic solution Place 1 drop into both eyes at bedtime.      mirtazapine (REMERON) 30 MG tablet  TAKE 1 TABLET BY MOUTH EVERY NIGHT AT BEDTIME 30 tablet 12   Multiple Vitamin (MULTIVITAMIN) tablet Take 1 tablet by mouth daily.     Probiotic Product (DIGESTIVE ADVANTAGE PO) Take 1 tablet by mouth daily.     psyllium (METAMUCIL) 58.6 % powder Take 1 packet by mouth daily as needed (regularity).      tamsulosin (FLOMAX) 0.4 MG CAPS capsule Take 1 capsule (0.4 mg total) by mouth daily. 90 capsule 3   timolol (TIMOPTIC) 0.5 % ophthalmic solution Place 1 drop into both eyes daily.      ALPRAZolam (XANAX) 0.5 MG tablet Take 0.5 mg by mouth at bedtime as needed for anxiety. (Patient not taking: Reported on 02/08/2023)     butalbital-acetaminophen-caffeine (BAC) 50-325-40 MG tablet TAKE 1 OR 2 TABLETS BY MOUTH EVERY 6 HOURS AS NEEDED FOR HEADACHE (Patient not taking: Reported on 02/08/2023) 20 tablet 2   diclofenac (VOLTAREN) 50 MG EC tablet TAKE ONE TABLET BY MOUTH TWICE DAILY AS NEEDED (Patient not taking: Reported on 02/08/2023) 180 tablet 0   fluorouracil (EFUDEX) 5 % cream Start on 12/18/21. Apply twice a day to temples and cheeks x 4 days. 15 g 0   ketoconazole (NIZORAL) 2 % shampoo Apply 1 Application topically 3 (three) times a week. Wash scalp 3 times a week, let sit 5 minutes and rinse out 120 mL 6   mometasone (ELOCON) 0.1 % cream Apply 1 application. topically daily as needed (Rash). Qd up to 5 days a week to itchy bumps on body 45 g 1   mupirocin ointment (BACTROBAN) 2 % Apply 1 application. topically daily. Qd  to open sores 22 g 3   tiZANidine (ZANAFLEX) 4 MG tablet Take 1 tablet (4 mg total) by mouth every 8 (eight) hours as needed for muscle spasms. 20 tablet 0     Abtx:  Anti-infectives (From admission, onward)    Start     Dose/Rate Route Frequency Ordered Stop   02/09/23 0000  vancomycin (VANCOREADY) IVPB 1500 mg/300 mL        1,500 mg 150 mL/hr over 120 Minutes Intravenous Every 24 hours 02/08/23 0445 02/14/23 2359   02/08/23 1200  ceFEPIme (MAXIPIME) 2 g in sodium chloride 0.9 % 100  mL IVPB        2 g 200 mL/hr over 30 Minutes Intravenous Every 12 hours 02/08/23 0439 02/14/23 2359   02/08/23 0600  metroNIDAZOLE (FLAGYL) IVPB 500 mg        500 mg 100 mL/hr over 60 Minutes Intravenous Every 12 hours 02/08/23 0431 02/15/23 0559   02/08/23 0130  ceFEPIme (MAXIPIME) 2 g in sodium chloride 0.9 % 100 mL IVPB        2 g 200 mL/hr over 30 Minutes Intravenous  Once 02/08/23 0127 02/08/23 0316   02/08/23 0130  vancomycin (VANCOREADY) IVPB 2000 mg/400 mL        2,000 mg 200 mL/hr over 120 Minutes Intravenous  Once 02/08/23 0127 02/08/23 0524      Wife at bedside and she is able to give some history REVIEW OF SYSTEMS:  Const: negative fever, negative chills, negative weight loss Eyes: negative diplopia or visual changes, negative eye pain ENT: negative coryza, negative sore throat Resp: negative cough, hemoptysis, dyspnea Cards: negative for chest pain, palpitations, lower extremity edema GU: Has had frequency, difficulty in passing urine and had taken double the dose of tamsulosin.   GI: Negative for abdominal pain, diarrhea, bleeding, constipation Skin: negative for rash and pruritus Heme: negative for easy bruising and gum/nose bleeding MS: Muscle weakness Low back pain  Neurolo: Altered mental status resolved  psych:  anxiety, depression  Endocrine: negative for thyroid, diabetes Allergy/Immunology-penicillin  Objective:  VITALS:  Patient Vitals for the past 24 hrs:  BP Temp Temp src Pulse Resp SpO2 Height Weight  02/08/23 1253 108/61 (!) 97.5 F (36.4 C) -- (!) 42 20 95 % -- --  02/08/23 1220 (!) 108/58 (!) 97.5 F (36.4 C) Oral (!) 45 17 96 % -- --  02/08/23 1200 91/61 -- -- -- -- -- -- --  02/08/23 1100 (!) 92/56 -- -- -- -- -- -- --  02/08/23 0900 100/71 -- -- (!) 45 17 94 % -- --  02/08/23 0808 104/71 (!) 97.4 F (36.3 C) Oral (!) 57 (!) 23 92 % -- --  02/08/23 0706 -- -- -- -- -- -- 6\' 4"  (1.93 m) 93.4 kg  02/08/23 0700 102/66 -- -- (!) 41 18 95 % --  --  02/08/23 0512 -- 98.7 F (37.1 C) Oral -- -- -- -- --  02/08/23 0128 -- 100.3 F (37.9 C) Rectal -- -- -- -- --  02/07/23 2332 -- -- -- 68 -- -- -- --  02/07/23 2328 119/66 97.7 F (36.5 C) Oral -- 20 93 % -- --     PHYSICAL EXAM:  General: Alert, cooperative, no distress, appears stated age.  Head: Normocephalic, without obvious abnormality, atraumatic. Eyes: Conjunctivae clear, anicteric sclerae. Pupils are equal ENT Nares normal. No drainage or sinus tenderness. Lips, mucosa, and tongue normal. No Thrush Neck: Supple, symmetrical, no adenopathy, thyroid: non tender no  carotid bruit and no JVD. Back: No CVA tenderness. Lungs: Clear to auscultation bilaterally. No Wheezing or Rhonchi. No rales. Heart: Regular rate and rhythm, no murmur, rub or gallop. Abdomen: Soft, non-tender,not distended. Bowel sounds normal. No masses Extremities: Bruising and tears  skin: Wound on the upper back     Lymph: Cervical, supraclavicular normal. Neurologic: Grossly non-focal Pertinent Labs Lab Results CBC    Latest Ref Rng & Units 02/08/2023    6:38 AM 02/07/2023   11:35 PM 01/12/2022    7:12 AM  CBC  WBC 4.0 - 10.5 K/uL 20.3  22.5  9.0   Hemoglobin 13.0 - 17.0 g/dL 13.2  14.2  16.4   Hematocrit 39.0 - 52.0 % 41.3  43.5  48.7   Platelets 150 - 400 K/uL 141  156  303         Latest Ref Rng & Units 02/08/2023    6:38 AM 02/07/2023   11:35 PM 01/12/2022    7:12 AM  CMP  Glucose 70 - 99 mg/dL  207  116   BUN 8 - 23 mg/dL  52  24   Creatinine 0.61 - 1.24 mg/dL 1.72  1.66  1.23   Sodium 135 - 145 mmol/L  135  142   Potassium 3.5 - 5.1 mmol/L  3.5  4.6   Chloride 98 - 111 mmol/L  103  101   CO2 22 - 32 mmol/L  21  26   Calcium 8.9 - 10.3 mg/dL  8.6  10.1   Total Protein 6.5 - 8.1 g/dL  6.8  7.3   Total Bilirubin 0.3 - 1.2 mg/dL  0.9  0.5   Alkaline Phos 38 - 126 U/L  73  94   AST 15 - 41 U/L  28  17   ALT 0 - 44 U/L  22  19       Microbiology: Recent Results (from the past 240  hour(s))  Blood Culture (routine x 2)     Status: None (Preliminary result)   Collection Time: 02/07/23 11:37 PM   Specimen: BLOOD  Result Value Ref Range Status   Specimen Description BLOOD  LEFT FOREARM  Final   Special Requests   Final    BOTTLES DRAWN AEROBIC AND ANAEROBIC Blood Culture adequate volume   Culture  Setup Time   Final    GRAM POSITIVE COCCI IN BOTH AEROBIC AND ANAEROBIC BOTTLES CRITICAL RESULT CALLED TO, READ BACK BY AND VERIFIED WITH: Montez Hageman 02/08/23 1153 MW Performed at [facility], [address] [place], [place] [address]    Culture GRAM POSITIVE COCCI  Final   Report Status PENDING  Incomplete  Blood Culture (routine x 2)     Status: None (Preliminary result)   Collection Time: 02/07/23 11:37 PM   Specimen: BLOOD  Result Value Ref Range Status   Specimen Description BLOOD  LEFT AC  Final   Special Requests   Final    BOTTLES DRAWN AEROBIC AND ANAEROBIC Blood Culture adequate volume   Culture  Setup Time   Final    GRAM POSITIVE COCCI IN BOTH AEROBIC AND ANAEROBIC BOTTLES Organism ID to follow CRITICAL RESULT CALLED TO, READ BACK BY AND VERIFIED WITHMontez Hageman 02/08/23 1153 MW Performed at [facility], [address], [place], [place] [address]    Culture GRAM POSITIVE COCCI  Final   Report Status PENDING  Incomplete  Blood Culture ID Panel (Reflexed)     Status: Abnormal   Collection  Time: 02/07/23 11:37 PM  Result Value Ref Range Status   Enterococcus faecalis NOT DETECTED NOT DETECTED Final   Enterococcus Faecium NOT DETECTED NOT DETECTED Final   Listeria monocytogenes NOT DETECTED NOT DETECTED Final   Staphylococcus species DETECTED (A) NOT DETECTED Final    Comment: CRITICAL RESULT CALLED TO, READ BACK BY AND VERIFIED WITH: ALVERA MADUME 02/08/23 1154 MW    Staphylococcus aureus (BCID) DETECTED (A) NOT DETECTED Final    Comment: CRITICAL RESULT CALLED TO, READ BACK BY AND VERIFIED WITH: ALVERA MADUME 02/08/23 1154 MW     Staphylococcus epidermidis NOT DETECTED NOT DETECTED Final   Staphylococcus lugdunensis NOT DETECTED NOT DETECTED Final   Streptococcus species NOT DETECTED NOT DETECTED Final   Streptococcus agalactiae NOT DETECTED NOT DETECTED Final   Streptococcus pneumoniae NOT DETECTED NOT DETECTED Final   Streptococcus pyogenes NOT DETECTED NOT DETECTED Final   A.calcoaceticus-baumannii NOT DETECTED NOT DETECTED Final   Bacteroides fragilis NOT DETECTED NOT DETECTED Final   Enterobacterales NOT DETECTED NOT DETECTED Final   Enterobacter cloacae complex NOT DETECTED NOT DETECTED Final   Escherichia coli NOT DETECTED NOT DETECTED Final   Klebsiella aerogenes NOT DETECTED NOT DETECTED Final   Klebsiella oxytoca NOT DETECTED NOT DETECTED Final   Klebsiella pneumoniae NOT DETECTED NOT DETECTED Final   Proteus species NOT DETECTED NOT DETECTED Final   Salmonella species NOT DETECTED NOT DETECTED Final   Serratia marcescens NOT DETECTED NOT DETECTED Final   Haemophilus influenzae NOT DETECTED NOT DETECTED Final   Neisseria meningitidis NOT DETECTED NOT DETECTED Final   Pseudomonas aeruginosa NOT DETECTED NOT DETECTED Final   Stenotrophomonas maltophilia NOT DETECTED NOT DETECTED Final   Candida albicans NOT DETECTED NOT DETECTED Final   Candida auris NOT DETECTED NOT DETECTED Final   Candida glabrata NOT DETECTED NOT DETECTED Final   Candida krusei NOT DETECTED NOT DETECTED Final   Candida parapsilosis NOT DETECTED NOT DETECTED Final   Candida tropicalis NOT DETECTED NOT DETECTED Final   Cryptococcus neoformans/gattii NOT DETECTED NOT DETECTED Final   Meth resistant mecA/C and MREJ NOT DETECTED NOT DETECTED Final    Comment: Performed at [facility], [address] [place], [place] [address]  Resp panel by RT-PCR (RSV, Flu A&B, Covid) Anterior Nasal Swab     Status: None   Collection Time: 02/08/23  4:31 AM   Specimen: Anterior Nasal Swab  Result Value Ref Range Status   SARS  Coronavirus 2 by RT PCR NEGATIVE NEGATIVE Final    Comment: (NOTE) SARS-CoV-2 target nucleic acids are NOT DETECTED.  The SARS-CoV-2 RNA is generally detectable in upper respiratory specimens during the acute phase of infection. The lowest concentration of SARS-CoV-2 viral copies this assay can detect is 138 copies/mL. A negative result does not preclude SARS-Cov-2 infection and should not be used as the sole basis for treatment or other patient management decisions. A negative result may occur with  improper specimen collection/handling, submission of specimen other than nasopharyngeal swab, presence of viral mutation(s) within the areas targeted by this assay, and inadequate number of viral copies(<138 copies/mL). A negative result must be combined with clinical observations, patient history, and epidemiological information. The expected result is Negative.  Fact Sheet for Patients:  EntrepreneurPulse.com.au  Fact Sheet for Healthcare Providers:  IncredibleEmployment.be  This test is no t yet approved or cleared by the [place] FDA and  has been authorized for detection and/or diagnosis of SARS-CoV-2 by FDA under an Emergency Use Authorization (EUA). This EUA will remain  in effect (meaning this test can be used) for the duration of the COVID-19 declaration under Section 564(b)(1) of the Act, 21 U.S.C.section 360bbb-3(b)(1), unless the authorization is terminated  or revoked sooner.       Influenza A by PCR NEGATIVE NEGATIVE Final   Influenza B by PCR NEGATIVE NEGATIVE Final    Comment: (NOTE) The Xpert Xpress SARS-CoV-2/FLU/RSV plus assay is intended as an aid in the diagnosis of influenza from Nasopharyngeal swab specimens and should not be used as a sole basis for treatment. Nasal washings and aspirates are unacceptable for Xpert Xpress SARS-CoV-2/FLU/RSV testing.  Fact Sheet for  Patients: EntrepreneurPulse.com.au  Fact Sheet for Healthcare Providers: IncredibleEmployment.be  This test is not yet approved or cleared by the [place] FDA and has been authorized for detection and/or diagnosis of SARS-CoV-2 by FDA under an Emergency Use Authorization (EUA). This EUA will remain in effect (meaning this test can be used) for the duration of the COVID-19 declaration under Section 564(b)(1) of the Act, 21 U.S.C. section 360bbb-3(b)(1), unless the authorization is terminated or revoked.     Resp Syncytial Virus by PCR NEGATIVE NEGATIVE Final    Comment: (NOTE) Fact Sheet for Patients: EntrepreneurPulse.com.au  Fact Sheet for Healthcare Providers: IncredibleEmployment.be  This test is not yet approved or cleared by the [place] FDA and has been authorized for detection and/or diagnosis of SARS-CoV-2 by FDA under an Emergency Use Authorization (EUA). This EUA will remain in effect (meaning this test can be used) for the duration of the COVID-19 declaration under Section 564(b)(1) of the Act, 21 U.S.C. section 360bbb-3(b)(1), unless the authorization is terminated or revoked.  Performed at [facility], [address] [place], [place] [address]     IMAGING RESULTS: I have personally reviewed the films ? Impression/Recommendation ? Altered mental status is resolved now was likely a combination of pain meds and muscle relaxant  Staph aureus bacteremia.  Source unclear but could be this skin wounds will do a culture from the wound Will need 2D echo He will also need TEE MRI of the lumbar spine is negative for discitis or osteomyelitis as he has degenerative disc disease Currently on vancomycin, cefepime and Flagyl.  Discontinue the 3 antibiotics Start cefazolin  AKI  Leukocytosis  History of lumbar spine fusion  History of renal stones in the past has been  nonobstructive. May need to repeat a CT abdomen to look for any changes to the kidney.  History of lithotripsy  History of BPH on tamsulosin and finasteride.   ___________________________________________________ Discussed with patient, requesting provider Note:  This document was prepared using Dragon voice recognition software and may include unintentional dictation errors.

## 2023-02-08 NOTE — Assessment & Plan Note (Signed)
Suspect musculoskeletal versus GU etiology MRI lumbosacral shows multiple level degenerative changes lumbar spine with history of lumbar neuritis CT renal stone study showing renal calculi but no ureteral stones, no hydronephrosis Pain control with muscle relaxants, NSAIDs with close monitoring of renal function.  Avoid narcotics until patient is more awake and alert

## 2023-02-08 NOTE — ED Notes (Signed)
Assumed care of pt from Alexys, RN.  Pt resting comfortable in bed no distress noted. Pt did request diet order and meds. MD aware. Call light within reach.

## 2023-02-08 NOTE — Assessment & Plan Note (Addendum)
Acute metabolic encephalopathy Mental status improved with Narcan on the scene by EMS Neuro logic checks with Narcan as needed Avoid opiates for now Will get UDS

## 2023-02-08 NOTE — Assessment & Plan Note (Signed)
Continue tamsulosin and finasteride 

## 2023-02-08 NOTE — Progress Notes (Signed)
Pharmacy Antibiotic Note  Brett Bender is a 77 y.o. male admitted on 02/07/2023 with sepsis.  Pharmacy has been consulted for Cefepime & Vancomycin dosing x 7 days.  Plan: Cefepime 2 gm q12hr per indication & renal fxn.  Pt given Vancomycin 2000 mg once. Vancomycin 1500 mg IV Q 24 hrs. Goal AUC 400-550. Expected AUC: 519 SCr used: 1.66  Pharmacy will continue to follow and will adjust abx dosing whenever warranted.  Temp (24hrs), Avg:98.9 F (37.2 C), Min:97.7 F (36.5 C), Max:100.3 F (37.9 C)   Recent Labs  Lab 02/07/23 2335  WBC 22.5*  CREATININE 1.66*  LATICACIDVEN 2.5*    Estimated Creatinine Clearance: 46.5 mL/min (A) (by C-G formula based on SCr of 1.66 mg/dL (H)).    Allergies  Allergen Reactions   Penicillins Other (See Comments)    Stomach Ache    Antimicrobials this admission: 4/02 Cefepime >> x 7 days 4/02 Vancomycin >> x 7 days 4/02 Flagyl >> x 7 days  Microbiology results: 04/01 BCx: Pending  Thank you for allowing pharmacy to be a part of this patient's care.  Renda Rolls, PharmD, [facility] 02/08/2023 4:40 AM

## 2023-02-08 NOTE — ED Notes (Signed)
Pharmacy Tech at bedside  

## 2023-02-08 NOTE — Assessment & Plan Note (Addendum)
History of lumbosacral decompressive surgery Multimodal pain control with muscle relaxants, heating pad, gabapentin occasional NSAID until more awake.  Will avoid narcotics in the setting of accidental overdose

## 2023-02-08 NOTE — Assessment & Plan Note (Addendum)
Multiple nonobstructing renal calculi with no hydronephrosis

## 2023-02-08 NOTE — H&P (Signed)
History and Physical    Patient: Brett Bender [id number] DOB: 1945-11-14 DOA: 02/07/2023 DOS: the patient was seen and examined on 02/08/2023 PCP: Birdie Sons, MD  Patient coming from: Home  Chief Complaint:  Chief Complaint  Patient presents with   Dysuria    Patient has been having flank pain x week. Went to PCP and treated him for pulled muscle. Cont to have increasing flank pain. Taking muslce relaxers and expired norco prescription. Called to home for Overdose unintentional. EMS gave 1 mg of Narcan on scene   Drug Overdose    Accidental overdose trying to get ahead of flank pain    HPI: Brett Bender is a 77 y.o. male with medical history significant for BPH, nephrolithiasis, lumbosacral neuritis s/p lumbar decompression in 2019, anxiety and depression, HTN, who presents to the ED by EMS for possible unintentional opiate overdose revisiting partially with Narcan on the scene .  Patient has been having right-sided flank pain for the past week which was being treated as muscle strain by his PCP with tizanidine.  Due to refractory pain he started taking some expired Norco.  His wife found him down and difficult to arouse.  EMS reported an axillary temp of 101.  They administered Narcan after noticing pinpoint pupils with improvement in mentation.  At the time of my evaluation patient is awake but lethargic. ED course and data review: Tmax 100.3 with otherwise normal vitals.  WBC 22,000 with procalcitonin 2.46, lactic acid 2.5.  CMP significant for Creatinine of 1.66 baseline 1.23 with bicarb of 21, glucose 207 with no prior history of diabetes.  Troponin 35.  Urinalysis with mild proteinuria and rare bacteria. EKG, personally viewed and interpreted shows sinus rhythm at 68. Patient underwent extensive imaging studies to include chest x-ray, CT head, CT renal stone study and CTA chest PE protocol, none of which showed any acute findings.  Chest x-ray showed possible basilar infiltrate  but this was not seen on CTA chest. MRI lumbar spine ruled out discitis or osteomyelitis or epidural collection but showed multilevel degenerative changes. Patient was started on sepsis fluids and antibiotics for sepsis of unknown source.  Respiratory viral panel ordered.   Hospitalist consulted for admission.   Review of Systems: As mentioned in the history of present illness. All other systems reviewed and are negative.  Past Medical History:  Diagnosis Date   Actinic keratosis    Arthritis    Basal cell carcinoma 11/09/2013   R lateral edge of clavicle   Basal cell carcinoma 02/03/2016   L lateral supraclavicular base of neck/excision   Basal cell carcinoma 11/29/2018   L upper back   Basal cell carcinoma 09/11/2020   R mid back    Cancer (HCC)    Basal cell carcinoma bilateral arms and left shoulder   Complication of anesthesia    slow to wake up with shoulder surgery   Depression    Enlarged prostate    H/O elbow surgery    left   Herpes simplex    History of kidney stones    History of MRSA infection 2004   skin around eye   Hypertension    Kidney stone    Lumbar stenosis    Migraine headache    Motion sickness    ocean boats   PONV (postoperative nausea and vomiting)    Tendonitis of ankle, left    Past Surgical History:  Procedure Laterality Date   APPENDECTOMY     back  injection     BACK SURGERY     CYSTOSCOPY/URETEROSCOPY/HOLMIUM LASER/STENT PLACEMENT Left 12/18/2015   Procedure: CYSTOSCOPY/URETEROSCOPY/HOLMIUM LASER LITHOTRIPSY;  Surgeon: Hollice Espy, MD;  Location: [facility];  Service: Urology;  Laterality: Left;   ELBOW SURGERY     LOWER BACK SURGERY  2010   L4-5 fusion   LUMBAR LAMINECTOMY/DECOMPRESSION MICRODISCECTOMY N/A 08/21/2018   Procedure: LUMBAR LAMINECTOMY/DECOMPRESSION MICRODISCECTOMY 1 LEVEL-L3/4;  Surgeon: Deetta Perla, MD;  Location: [facility];  Service: Neurosurgery;  Laterality: N/A;   NECK SURGERY     Upper neck surgery; with wires  placed   SHOULDER SURGERY Right 11/10/2011   arthroscopic surgery . Dr. Tamala Julian, [place] Left 12/04/2019   Procedure: TRICEPS TENDON REPAIR, OLECRANON BURSA;  Surgeon: Leim Fabry, MD;  Location: [place];  Service: Orthopedics;  Laterality: Left;   Social History:  reports that he has never smoked. He has never used smokeless tobacco. He reports that he does not drink alcohol and does not use drugs.  Allergies  Allergen Reactions   Penicillins Other (See Comments)    Stomach Ache    Family History  Problem Relation Age of Onset   Depression Son    Diabetes Neg Hx    Cancer Neg Hx    Heart attack Neg Hx    Kidney disease Neg Hx    Prostate cancer Neg Hx    Kidney cancer Neg Hx    Bladder Cancer Neg Hx     Prior to Admission medications   Medication Sig Start Date End Date Taking? Authorizing Provider  ALPRAZolam Duanne Moron) 0.5 MG tablet Take 0.5 mg by mouth at bedtime as needed for anxiety.    [provider]  amLODipine (NORVASC) 10 MG tablet TAKE 1 TABLET BY MOUTH ONCE A DAY 12/21/22   Birdie Sons, MD  aspirin 81 MG chewable tablet Chew by mouth daily.    [provider]  atenolol-chlorthalidone (TENORETIC) 50-25 MG tablet TAKE 1/2 TABLET BY MOUTH ONCE DAILY 10/04/22   Birdie Sons, MD  butalbital-acetaminophen-caffeine (BAC) 50-325-40 MG tablet TAKE 1 OR 2 TABLETS BY MOUTH EVERY 6 HOURS AS NEEDED FOR HEADACHE 04/08/20   Birdie Sons, MD  diclofenac (VOLTAREN) 50 MG EC tablet TAKE ONE TABLET BY MOUTH TWICE DAILY AS NEEDED 01/13/23   Ostwalt, Letitia Libra, PA-C  diclofenac Sodium (VOLTAREN) 1 % GEL APPLY 4G TOPICALLY FIVE TIMES DAILY 03/24/22   Birdie Sons, MD  fexofenadine [facility] ALLERGY) 180 MG tablet Take 1 tablet (180 mg total) by mouth daily. 05/28/21   Vigg, Avanti, MD  finasteride (PROSCAR) 5 MG tablet Take 1 tablet (5 mg total) by mouth daily. 06/09/22   Zara Council A, PA-C  fluorouracil (EFUDEX) 5  % cream Start on 12/18/21. Apply twice a day to temples and cheeks x 4 days. 11/18/21   Ralene Bathe, MD  ketoconazole (NIZORAL) 2 % shampoo Apply 1 Application topically 3 (three) times a week. Wash scalp 3 times a week, let sit 5 minutes and rinse out 08/27/22   Ralene Bathe, MD  latanoprost (XALATAN) 0.005 % ophthalmic solution Place 1 drop into both eyes at bedtime.  12/05/18   [provider]  mirtazapine (REMERON) 30 MG tablet TAKE 1 TABLET BY MOUTH EVERY NIGHT AT BEDTIME 05/27/22   Ostwalt, Janna, PA-C  mometasone (ELOCON) 0.1 % cream Apply 1 application. topically daily as needed (Rash). Qd up to 5 days a week to itchy bumps on  body 01/20/22   Ralene Bathe, MD  Multiple Vitamin (MULTIVITAMIN) tablet Take 1 tablet by mouth daily.    [provider]  mupirocin ointment (BACTROBAN) 2 % Apply 1 application. topically daily. Qd to open sores 01/20/22   Ralene Bathe, MD  Probiotic Product (DIGESTIVE ADVANTAGE PO) Take 1 tablet by mouth daily.    [provider]  psyllium (METAMUCIL) 58.6 % powder Take 1 packet by mouth daily as needed (regularity).     [provider]  tamsulosin (FLOMAX) 0.4 MG CAPS capsule Take 1 capsule (0.4 mg total) by mouth daily. 06/09/22   Zara Council A, PA-C  timolol (TIMOPTIC) 0.5 % ophthalmic solution Place 1 drop into both eyes daily.  07/04/19   [provider]  tiZANidine (ZANAFLEX) 4 MG tablet Take 1 tablet (4 mg total) by mouth every 8 (eight) hours as needed for muscle spasms. 02/07/23   Birdie Sons, MD    Physical Exam: Vitals:   02/07/23 2328 02/07/23 2332 02/08/23 0128  BP: 119/66    Pulse:  68   Resp: 20    Temp: 97.7 F (36.5 C)  100.3 F (37.9 C)  TempSrc: Oral  Rectal  SpO2: 93%     Physical Exam Vitals and nursing note reviewed.  Constitutional:      General: He is not in acute distress.    Appearance: He is ill-appearing.  HENT:     Head: Normocephalic and atraumatic.   Cardiovascular:     Rate and Rhythm: Normal rate and regular rhythm.     Heart sounds: Normal heart sounds.  Pulmonary:     Effort: Pulmonary effort is normal.     Breath sounds: Normal breath sounds.  Abdominal:     Palpations: Abdomen is soft.     Tenderness: There is no abdominal tenderness.  Neurological:     General: No focal deficit present.     Mental Status: He is lethargic.     Labs on Admission: I have personally reviewed following labs and imaging studies  CBC: Recent Labs  Lab 02/07/23 2335  WBC 22.5*  NEUTROABS 18.6*  HGB 14.2  HCT 43.5  MCV 93.8  PLT [id number]   Basic Metabolic Panel: Recent Labs  Lab 02/07/23 2335  NA 135  K 3.5  CL 103  CO2 21*  GLUCOSE 207*  BUN 52*  CREATININE 1.66*  CALCIUM 8.6*   GFR: Estimated Creatinine Clearance: 46.5 mL/min (A) (by C-G formula based on SCr of 1.66 mg/dL (H)). Liver Function Tests: Recent Labs  Lab 02/07/23 2335  AST 28  ALT 22  ALKPHOS 73  BILITOT 0.9  PROT 6.8  ALBUMIN 3.4*   No results for input(s): "LIPASE", "AMYLASE" in the last 168 hours. No results for input(s): "AMMONIA" in the last 168 hours. Coagulation Profile: Recent Labs  Lab 02/07/23 2335  INR 1.2   Cardiac Enzymes: No results for input(s): "CKTOTAL", "CKMB", "CKMBINDEX", "TROPONINI" in the last 168 hours. BNP (last 3 results) No results for input(s): "PROBNP" in the last 8760 hours. HbA1C: No results for input(s): "HGBA1C" in the last 72 hours. CBG: No results for input(s): "GLUCAP" in the last 168 hours. Lipid Profile: No results for input(s): "CHOL", "HDL", "LDLCALC", "TRIG", "CHOLHDL", "LDLDIRECT" in the last 72 hours. Thyroid Function Tests: No results for input(s): "TSH", "T4TOTAL", "FREET4", "T3FREE", "THYROIDAB" in the last 72 hours. Anemia Panel: No results for input(s): "VITAMINB12", "FOLATE", "FERRITIN", "TIBC", "IRON", "RETICCTPCT" in the last 72 hours. Urine analysis:  Component Value Date/Time   COLORURINE  AMBER (A) 02/07/2023 2335   APPEARANCEUR HAZY (A) 02/07/2023 2335   APPEARANCEUR Clear 08/07/2021 1118   LABSPEC 1.026 02/07/2023 2335   LABSPEC 1.008 02/27/2012 1139   PHURINE 5.0 02/07/2023 2335   GLUCOSEU NEGATIVE 02/07/2023 2335   GLUCOSEU Negative 02/27/2012 1139   HGBUR NEGATIVE 02/07/2023 2335   BILIRUBINUR NEGATIVE 02/07/2023 2335   BILIRUBINUR neg 02/07/2023 0946   BILIRUBINUR Negative 08/07/2021 1118   BILIRUBINUR Negative 02/27/2012 1139   KETONESUR NEGATIVE 02/07/2023 2335   PROTEINUR 100 (A) 02/07/2023 2335   UROBILINOGEN 0.2 02/07/2023 0946   UROBILINOGEN 1.0 04/28/2010 1200   NITRITE NEGATIVE 02/07/2023 2335   LEUKOCYTESUR NEGATIVE 02/07/2023 2335   LEUKOCYTESUR Negative 02/27/2012 1139    Radiological Exams on Admission: MR Lumbar Spine W Wo Contrast  Result Date: 02/08/2023 CLINICAL DATA:  Septic with severe right-sided lumbar pain EXAM: MRI LUMBAR SPINE WITHOUT AND WITH CONTRAST TECHNIQUE: Multiplanar and multiecho pulse sequences of the lumbar spine were obtained without and with intravenous contrast. CONTRAST:  53mL GADAVIST GADOBUTROL 1 MMOL/ML IV SOLN COMPARISON:  07/24/2018 FINDINGS: Segmentation: 5 non rib-bearing vertebral bodies. The lowest fully formed disc space is labeled L5-S1. Alignment: 5 mm anterolisthesis of L4 on L5 unchanged from prior exam. 4 mm retrolisthesis of L3 on L4 appears new from the prior exam. Mild dextrocurvature. Vertebrae: No acute fracture or suspicious osseous lesion. A small amount of enhancement is noted at the inferior aspect of T12, where a Schmorl's node appears to be forming, and about the L3-L4 disc space, which correlates with edema on the STIR and is favored to be related to adjacent degenerative changes. No evidence of discitis. Postsurgical changes at L3-L4 and L4-L5. Conus medullaris and cauda equina: Conus extends to the L2 level. Conus and cauda equina appear normal. No abnormal enhancement. No epidural collection. Paraspinal  and other soft tissues: Negative. Disc levels: T12-L1: Minimal disc bulge. Mild facet arthropathy. No spinal canal stenosis or neural foraminal narrowing. L1-L2: No significant disc bulge. Mild facet arthropathy. No spinal canal stenosis or neural foraminal narrowing. L2-L3: Minimal disc bulge. Mild facet arthropathy. Ligamentum flavum hypertrophy. Mild spinal canal stenosis and mild bilateral neural foraminal narrowing, which have progressed from the prior exam. Narrowing of the lateral recesses. L3-L4: Mild retrolisthesis with disc osteophyte complex. Moderate facet arthropathy. Interval laminectomy. No residual spinal canal stenosis. Narrowing of the left lateral recess. Moderate left-greater-than-right neural foraminal narrowing, similar to prior L4-L5: Status post fusion. Grade 1 anterolisthesis with disc unroofing. Severe facet arthropathy. Moderate spinal canal stenosis, which has progressed from the prior exam. Narrowing of the lateral recesses. No neural foraminal narrowing. L5-S1: No significant disc bulge. Possible granulation tissue along the anterior aspect of the thecal sac versus extruded disc. Moderate to severe right and mild left facet arthropathy. Mild spinal canal stenosis. Narrowing of the lateral recesses. Moderate right neural foraminal narrowing, which has progressed from the prior exam IMPRESSION: 1. No evidence of discitis or osteomyelitis. No epidural collection. 2. L4-L5 moderate spinal canal stenosis, which has progressed from the prior exam. 3. L5-S1 mild spinal canal stenosis and moderate right neural foraminal narrowing, which has progressed from the prior exam. 4. L3-L4 moderate left-greater-than-right neural foraminal narrowing. 5. L2-L3 mild spinal canal stenosis and mild bilateral neural foraminal narrowing. 6. Narrowing of the lateral recesses at L2-L3, L3-L4, L4-L5, and L5-S1 could affect the descending L3, L4, L5, and S1 nerve roots, respectively. Electronically Signed   By:  Francetta Found.D.  On: 02/08/2023 02:26   CT Renal Stone Study  Result Date: 02/08/2023 CLINICAL DATA:  Right back and flank pain.  Urolithiasis. EXAM: CT ABDOMEN AND PELVIS WITHOUT CONTRAST TECHNIQUE: Multidetector CT imaging of the abdomen and pelvis was performed following the standard protocol without IV contrast. RADIATION DOSE REDUCTION: This exam was performed according to the departmental dose-optimization program which includes automated exposure control, adjustment of the mA and/or kV according to patient size and/or use of iterative reconstruction technique. COMPARISON:  05/03/2019 FINDINGS: Lower chest: No acute abnormality. Extensive coronary artery calcification Hepatobiliary: No focal liver abnormality is seen. No gallstones, gallbladder wall thickening, or biliary dilatation. Pancreas: Unremarkable Spleen: Unremarkable Adrenals/Urinary Tract: Right adrenal gland is unremarkable. 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 No follow-up imaging is recommended for this lesion. The kidneys are normal in size and position. Multiple nonobstructing calculi are seen within the kidneys bilaterally measuring up to 8 mm within the upper pole of the right kidney and 2 mm within the interpolar region of the left kidney. Stone burden within the right kidney has progressed slightly since prior examination. No hydronephrosis. No ureteral calculi. Moderate bilateral nonspecific perinephric stranding is present, progressive since prior examination. No perinephric fluid collections are seen. The bladder is decompressed and is unremarkable. Stomach/Bowel: Stomach is within normal limits. Appendix appears normal. No evidence of bowel wall thickening, distention, or inflammatory changes. Vascular/Lymphatic: Aortic atherosclerosis. No enlarged abdominal or pelvic lymph nodes. Reproductive: Prostate is unremarkable. Other: Tiny umbilical hernia contains mesenteric fat as well as the distal tip of  the appendix. Musculoskeletal: No acute bone abnormality. L4-5 anterior lumbar fusion procedure with instrumentation has been performed. No definite bridging callus identified. Advanced degenerative changes are seen at L3-4. No acute bone abnormality. No suspicious lytic or blastic bone lesion. Postsurgical changes are seen within the right iliac spine. IMPRESSION: 1. No acute intra-abdominal pathology identified. 2. Extensive coronary artery calcification. 3. 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 No follow-up imaging is recommended for this lesion. 4. Right greater than left nonobstructing nephrolithiasis. Stone burden within the right kidney has progressed slightly since prior examination. No urolithiasis. No hydronephrosis. 5. Progressive nonspecific bilateral perinephric stranding. Correlation with laboratory examination may be helpful to exclude an underlying infectious or inflammatory process. Aortic Atherosclerosis (ICD10-I70.0). Electronically Signed   By: Fidela Salisbury M.D.   On: 02/08/2023 01:11   CT Angio Chest PE W and/or Wo Contrast  Result Date: 02/08/2023 CLINICAL DATA:  Syncope. EXAM: CT ANGIOGRAPHY CHEST WITH CONTRAST TECHNIQUE: Multidetector CT imaging of the chest was performed using the standard protocol during bolus administration of intravenous contrast. Multiplanar CT image reconstructions and MIPs were obtained to evaluate the vascular anatomy. RADIATION DOSE REDUCTION: This exam was performed according to the departmental dose-optimization program which includes automated exposure control, adjustment of the mA and/or kV according to patient size and/or use of iterative reconstruction technique. CONTRAST:  45mL OMNIPAQUE IOHEXOL 350 MG/ML SOLN COMPARISON:  Chest radiograph dated 02/07/2023. FINDINGS: Evaluation of this exam is limited due to respiratory motion artifact. Cardiovascular: There is no cardiomegaly or pericardial effusion. Coronary vascular calcification of the LAD and left  circumflex artery. Mild atherosclerotic calcification of the thoracic aorta. No aneurysmal dilatation. Evaluation of the pulmonary arteries is limited due to respiratory motion and suboptimal visualization of the peripheral branches. No large central pulmonary artery embolus identified. Mediastinum/Nodes: No hilar or mediastinal adenopathy. The esophagus is grossly unremarkable. No mediastinal fluid collection. Lungs/Pleura: Bibasilar dependent atelectasis. No focal consolidation, pleural effusion,  or pneumothorax. The central airways are patent. Upper Abdomen: Fatty liver.  A 2 cm left adrenal adenoma. Musculoskeletal: No acute osseous pathology. Review of the MIP images confirms the above findings. IMPRESSION: 1. No acute intrathoracic pathology. No large central pulmonary artery embolus identified. 2. Coronary vascular calcification of the LAD and left circumflex artery. 3. Fatty liver. 4. A 2 cm left adrenal adenoma. 5.  Aortic Atherosclerosis (ICD10-I70.0). Electronically Signed   By: Anner Crete M.D.   On: 02/08/2023 01:02   CT HEAD WO CONTRAST (5MM)  Result Date: 02/08/2023 CLINICAL DATA:  Altered mental status EXAM: CT HEAD WITHOUT CONTRAST TECHNIQUE: Contiguous axial images were obtained from the base of the skull through the vertex without intravenous contrast. RADIATION DOSE REDUCTION: This exam was performed according to the departmental dose-optimization program which includes automated exposure control, adjustment of the mA and/or kV according to patient size and/or use of iterative reconstruction technique. COMPARISON:  01/05/2020 FINDINGS: Brain: Normal anatomic configuration. Parenchymal volume loss is commensurate with the patient's age. Mild periventricular white matter changes are present likely reflecting the sequela of small vessel ischemia. No abnormal intra or extra-axial mass lesion or fluid collection. No abnormal mass effect or midline shift. No evidence of acute intracranial  hemorrhage or infarct. Ventricular size is normal. Cerebellum unremarkable. Vascular: No asymmetric hyperdense vasculature at the skull base. Skull: Intact Sinuses/Orbits: Paranasal sinuses are clear. Orbits are unremarkable. Other: Mastoid air cells and middle ear cavities are clear. IMPRESSION: 1. No acute intracranial hemorrhage or infarct. 2. Mild senescent change. Electronically Signed   By: Fidela Salisbury M.D.   On: 02/08/2023 01:01   DG Chest Port 1 View  Result Date: 02/07/2023 CLINICAL DATA:  Opiate overdose, lethargy EXAM: PORTABLE CHEST 1 VIEW COMPARISON:  06/28/2019 FINDINGS: Single frontal view of the chest demonstrates an unremarkable cardiac silhouette. Continued ectasia of the thoracic aorta, likely not appreciably changed allowing for differences due to portable technique and AP positioning. Lung volumes are diminished, with patchy left basilar consolidation concerning for aspiration given clinical presentation. No effusion or pneumothorax. IMPRESSION: 1. Patchy left basilar consolidation, which could reflect hypoventilatory changes or aspiration given clinical presentation. 2. Stable thoracic aortic ectasia. Electronically Signed   By: Randa Ngo M.D.   On: 02/07/2023 23:54     Data Reviewed: Relevant notes from primary care and specialist visits, past discharge summaries as available in EHR, including Care Everywhere. Prior diagnostic testing as pertinent to current admission diagnoses Updated medications and problem lists for reconciliation ED course, including vitals, labs, imaging, treatment and response to treatment Triage notes, nursing and pharmacy notes and ED provider's notes Notable results as noted in HPI   Assessment and Plan: * Sepsis Possible SIRS If sepsis ruled in, source unknown at this time Sepsis criteria include fever, leukocytosis, lactic acidosis and elevated procalcitonin Extensive CT imaging unrevealing of source (CT renal stone, CTA chest, CT head,  MRI LS spine) CT renal stone did show perinephric stranding but urinalysis unremarkable - Follow respiratory viral panel, follow blood cultures - Continue sepsis fluids - Continue antibiotics for sepsis of unknown source  Right flank pain Suspect musculoskeletal versus GU etiology MRI lumbosacral shows multiple level degenerative changes lumbar spine with history of lumbar neuritis CT renal stone study showing renal calculi but no ureteral stones, no hydronephrosis Pain control with muscle relaxants, NSAIDs with close monitoring of renal function.  Avoid narcotics until patient is more awake and alert  Overdose opiate, accidental or unintentional, initial encounter Acute metabolic encephalopathy  Mental status improved with Narcan on the scene by EMS Neuro logic checks with Narcan as needed Avoid opiates for now Will get UDS  AKI (acute kidney injury) Suspecting acute with creatinine 1.66, most recent baseline a year ago was 1.2  Anxiety Remeron nightly Hold alprazolam  History of nephrolithiasis Multiple nonobstructing renal calculi with no hydronephrosis  BPH (benign prostatic hyperplasia) Continue tamsulosin and finasteride  Lumbosacral neuritis History of lumbosacral decompressive surgery Multimodal pain control with muscle relaxants, heating pad, gabapentin occasional NSAID until more awake.  Will avoid narcotics in the setting of accidental overdose  Essential (primary) hypertension Patient is normotensive Given sepsis, will hold home Tenoretic and amlodipine for now     DVT prophylaxis: Lovenox  Consults: none  Advance Care Planning:   Code Status: Prior   Family Communication: none  Disposition Plan: Back to previous home environment  Severity of Illness: The appropriate patient status for this patient is INPATIENT. Inpatient status is judged to be reasonable and necessary in order to provide the required intensity of service to ensure the patient's  safety. The patient's presenting symptoms, physical exam findings, and initial radiographic and laboratory data in the context of their chronic comorbidities is felt to place them at high risk for further clinical deterioration. Furthermore, it is not anticipated that the patient will be medically stable for discharge from the hospital within 2 midnights of admission.   * I certify that at the point of admission it is my clinical judgment that the patient will require inpatient hospital care spanning beyond 2 midnights from the point of admission due to high intensity of service, high risk for further deterioration and high frequency of surveillance required.*  Author: Athena Masse, MD 02/08/2023 4:15 AM  For on call review [email or web address].

## 2023-02-08 NOTE — Progress Notes (Signed)
       CROSS COVER NOTE  NAME: Brett Bender MRN: [id number] DOB : 1946-11-03 ATTENDING PHYSICIAN: Richarda Osmond, MD    Date of Service   02/08/2023   HPI/Events of Note   Report/Request "hello this patient was admitted for accidental overdose and sepsis. home meds remeron, tamsulosin, finasteride are not ordered for him. can these be ordered for him? I only saw they were holding tenoretic and amlodipine for now in dr duncan's note"    Interventions   Assessment/Plan: Remeron x1 Flomax x1 Finasteride x1 Please address home meds with patient on rounds       To reach the provider On-Call:   7AM- 7PM see care teams to locate the attending and reach out to them via [email or web address] [email or web address] 7PM-7AM contact night-coverage If you still have difficulty reaching the appropriate provider, please page the [facility] (Director on Call) for [facility] on amion for assistance  This document was prepared using Systems analyst and may include unintentional dictation errors.  Neomia Glass DNP, MBA, FNP-BC, PMHNP-BC Nurse Practitioner [facility] Pager [phone]

## 2023-02-08 NOTE — Progress Notes (Signed)
PHARMACY - PHYSICIAN COMMUNICATION CRITICAL VALUE ALERT - BLOOD CULTURE IDENTIFICATION (BCID)  Brett Bender is an 77 y.o. male who presented to [facility] on 02/07/2023 with a chief complaint of dysuria and flank pain for 1 week  Assessment:  Blood cultures with 4/4 GPC, BCID detects MSSA.    Name of physician (or Provider) Contacted: Drs Delaine Lame and Ouida Sills  Current antibiotics: Vancomycin/cefepime/metronidazole  Changes to prescribed antibiotics recommended:  Auto-ID consult  Results for orders placed or performed during the hospital encounter of 02/07/23  Blood Culture ID Panel (Reflexed) (Collected: 02/07/2023 11:37 PM)  Result Value Ref Range   Enterococcus faecalis NOT DETECTED NOT DETECTED   Enterococcus Faecium NOT DETECTED NOT DETECTED   Listeria monocytogenes NOT DETECTED NOT DETECTED   Staphylococcus species DETECTED (A) NOT DETECTED   Staphylococcus aureus (BCID) DETECTED (A) NOT DETECTED   Staphylococcus epidermidis NOT DETECTED NOT DETECTED   Staphylococcus lugdunensis NOT DETECTED NOT DETECTED   Streptococcus species NOT DETECTED NOT DETECTED   Streptococcus agalactiae NOT DETECTED NOT DETECTED   Streptococcus pneumoniae NOT DETECTED NOT DETECTED   Streptococcus pyogenes NOT DETECTED NOT DETECTED   A.calcoaceticus-baumannii NOT DETECTED NOT DETECTED   Bacteroides fragilis NOT DETECTED NOT DETECTED   Enterobacterales NOT DETECTED NOT DETECTED   Enterobacter cloacae complex NOT DETECTED NOT DETECTED   Escherichia coli NOT DETECTED NOT DETECTED   Klebsiella aerogenes NOT DETECTED NOT DETECTED   Klebsiella oxytoca NOT DETECTED NOT DETECTED   Klebsiella pneumoniae NOT DETECTED NOT DETECTED   Proteus species NOT DETECTED NOT DETECTED   Salmonella species NOT DETECTED NOT DETECTED   Serratia marcescens NOT DETECTED NOT DETECTED   Haemophilus influenzae NOT DETECTED NOT DETECTED   Neisseria meningitidis NOT DETECTED NOT DETECTED   Pseudomonas aeruginosa NOT  DETECTED NOT DETECTED   Stenotrophomonas maltophilia NOT DETECTED NOT DETECTED   Candida albicans NOT DETECTED NOT DETECTED   Candida auris NOT DETECTED NOT DETECTED   Candida glabrata NOT DETECTED NOT DETECTED   Candida krusei NOT DETECTED NOT DETECTED   Candida parapsilosis NOT DETECTED NOT DETECTED   Candida tropicalis NOT DETECTED NOT DETECTED   Cryptococcus neoformans/gattii NOT DETECTED NOT DETECTED   Meth resistant mecA/C and MREJ NOT DETECTED NOT DETECTED    Doreene Eland, PharmD, BCPS, BCIDP Work Cell: [phone] 02/08/2023 12:04 PM

## 2023-02-08 NOTE — ED Notes (Signed)
Assumed care from [facility], [place]. Pt resting comfortably in bed at this time. Pt denies any current needs or questions. Call light with in reach.

## 2023-02-08 NOTE — ED Notes (Signed)
Critical Lactic Acid 2.5  called to Vladimir Crofts MD

## 2023-02-08 NOTE — Progress Notes (Addendum)
  INTERVAL PROGRESS NOTE    Brett Bender- 77 y.o. male  LOS: 0 __________________________________________________________________  SUBJECTIVE: Admitted 02/07/2023 with cc of  Chief Complaint  Patient presents with   Dysuria    Patient has been having flank pain x week. Went to PCP and treated him for pulled muscle. Cont to have increasing flank pain. Taking muslce relaxers and expired norco prescription. Called to home for Overdose unintentional. EMS gave 1 mg of Narcan on scene   Drug Overdose    Accidental overdose trying to get ahead of flank pain   Since admission, has remained overall stable and requiring 2L [place].   OBJECTIVE: Blood pressure 102/66, pulse (!) 41, temperature 98.7 F (37.1 C), temperature source Oral, resp. rate 18, height 6\' 4"  (1.93 m), weight 93.4 kg, SpO2 95 %.  ASSESSMENT/PLAN:  I have reviewed the full H&P by Dr. Damita Dunnings, and I agree with the assessment and plan as outlined therein. In addition:  Sepsis- afebrile. Preliminary BxCx showing GPC.  - decreased IVF support due to taking good PO intake and stable BP - Continue antibiotics for sepsis of unknown source - ID consulted, appreciate your care  Overdose opiate, accidental or unintentional, initial encounter Acute metabolic encephalopathy- improving. UDS positive for opiates.    BPH- only 200cc UOP recorded today but unsure if all was measured.  - strict I/O - bladder scan if no further output by evening.  - send for urine culture Continue tamsulosin and finasteride   Principal Problem:   Sepsis Active Problems:   Right flank pain   Overdose opiate, accidental or unintentional, initial encounter   Essential (primary) hypertension   Lumbosacral neuritis   BPH (benign prostatic hyperplasia)   History of nephrolithiasis   Anxiety   AKI (acute kidney injury)   Acute metabolic encephalopathy   Richarda Osmond, DO [facility] 02/08/2023, 8:02 AM    [email or web address] Available by  Epic secure chat 7AM-7PM. If 7PM-7AM, please contact night-coverage   No Charge

## 2023-02-08 NOTE — Assessment & Plan Note (Signed)
Patient is normotensive Given sepsis, will hold home Tenoretic and amlodipine for now

## 2023-02-08 NOTE — Assessment & Plan Note (Signed)
Possible SIRS If sepsis ruled in, source unknown at this time Sepsis criteria include fever, leukocytosis, lactic acidosis and elevated procalcitonin Extensive CT imaging unrevealing of source (CT renal stone, CTA chest, CT head, MRI LS spine) CT renal stone did show perinephric stranding but urinalysis unremarkable - Follow respiratory viral panel, follow blood cultures - Continue sepsis fluids - Continue antibiotics for sepsis of unknown source

## 2023-02-09 ENCOUNTER — Inpatient Hospital Stay (HOSPITAL_COMMUNITY)
Admit: 2023-02-09 | Discharge: 2023-02-09 | Disposition: A | Discharge status: Home / Self Care | Payer: Medicare Other | Attending: Physician Assistant | Admitting: Physician Assistant

## 2023-02-09 DIAGNOSIS — B9561 Methicillin susceptible Staphylococcus aureus infection as the cause of diseases classified elsewhere: Secondary | ICD-10-CM

## 2023-02-09 DIAGNOSIS — R7881 Bacteremia: Secondary | ICD-10-CM

## 2023-02-09 DIAGNOSIS — A419 Sepsis, unspecified organism: Secondary | ICD-10-CM

## 2023-02-09 DIAGNOSIS — G9341 Metabolic encephalopathy: Secondary | ICD-10-CM | POA: Diagnosis not present

## 2023-02-09 DIAGNOSIS — Z87442 Personal history of urinary calculi: Secondary | ICD-10-CM

## 2023-02-09 DIAGNOSIS — R109 Unspecified abdominal pain: Secondary | ICD-10-CM | POA: Diagnosis not present

## 2023-02-09 DIAGNOSIS — N179 Acute kidney failure, unspecified: Secondary | ICD-10-CM

## 2023-02-09 DIAGNOSIS — R350 Frequency of micturition: Secondary | ICD-10-CM

## 2023-02-09 DIAGNOSIS — F419 Anxiety disorder, unspecified: Secondary | ICD-10-CM

## 2023-02-09 DIAGNOSIS — I10 Essential (primary) hypertension: Secondary | ICD-10-CM

## 2023-02-09 DIAGNOSIS — T40601A Poisoning by unspecified narcotics, accidental (unintentional), initial encounter: Secondary | ICD-10-CM | POA: Diagnosis not present

## 2023-02-09 DIAGNOSIS — R6520 Severe sepsis without septic shock: Secondary | ICD-10-CM

## 2023-02-09 DIAGNOSIS — N401 Enlarged prostate with lower urinary tract symptoms: Secondary | ICD-10-CM | POA: Diagnosis not present

## 2023-02-09 DIAGNOSIS — M5417 Radiculopathy, lumbosacral region: Secondary | ICD-10-CM

## 2023-02-09 HISTORY — DX: Methicillin susceptible Staphylococcus aureus infection as the cause of diseases classified elsewhere: B95.61

## 2023-02-09 LAB — ECHOCARDIOGRAM COMPLETE
AR max vel: 2.48 cm2
AV Area VTI: 2.52 cm2
AV Area mean vel: 2.25 cm2
AV Mean grad: 7 mmHg
AV Peak grad: 13.1 mmHg
Ao pk vel: 1.81 m/s
Area-P 1/2: 3.16 cm2
Height: 76 in
MV VTI: 3.08 cm2
S' Lateral: 3.2 cm
Weight: 3294.55 oz

## 2023-02-09 LAB — CBC
HCT: 40.8 % (ref 39.0–52.0)
Hemoglobin: 13.6 g/dL (ref 13.0–17.0)
MCH: 30.6 pg (ref 26.0–34.0)
MCHC: 33.3 g/dL (ref 30.0–36.0)
MCV: 91.9 fL (ref 80.0–100.0)
Platelets: 126 10*3/uL — ABNORMAL LOW (ref 150–400)
RBC: 4.44 MIL/uL (ref 4.22–5.81)
RDW: 14.2 % (ref 11.5–15.5)
WBC: 16.4 10*3/uL — ABNORMAL HIGH (ref 4.0–10.5)
nRBC: 0 % (ref 0.0–0.2)

## 2023-02-09 LAB — URINE CULTURE
Culture: <10000 — AB
Organism ID, Bacteria: NO GROWTH

## 2023-02-09 LAB — COMPREHENSIVE METABOLIC PANEL
ALT: 29 U/L (ref 0–44)
AST: 27 U/L (ref 15–41)
Albumin: 2.7 g/dL — ABNORMAL LOW (ref 3.5–5.0)
Alkaline Phosphatase: 73 U/L (ref 38–126)
Anion gap: 10 (ref 5–15)
BUN: 57 mg/dL — ABNORMAL HIGH (ref 8–23)
CO2: 24 mmol/L (ref 22–32)
Calcium: 8.5 mg/dL — ABNORMAL LOW (ref 8.9–10.3)
Chloride: 101 mmol/L (ref 98–111)
Creatinine, Ser: 1.56 mg/dL — ABNORMAL HIGH (ref 0.61–1.24)
GFR, Estimated: 46 mL/min — ABNORMAL LOW (ref >60)
Glucose, Bld: 146 mg/dL — ABNORMAL HIGH (ref 70–99)
Potassium: 3.4 mmol/L — ABNORMAL LOW (ref 3.5–5.1)
Sodium: 135 mmol/L (ref 135–145)
Total Bilirubin: 1.1 mg/dL (ref 0.3–1.2)
Total Protein: 6.1 g/dL — ABNORMAL LOW (ref 6.5–8.1)

## 2023-02-09 LAB — SPECIMEN STATUS REPORT

## 2023-02-09 LAB — LACTIC ACID, PLASMA: Lactic Acid, Venous: 1.6 mmol/L (ref 0.5–1.9)

## 2023-02-09 MED ORDER — LATANOPROST 0.005 % OP SOLN
1.0000 [drp] | Freq: Every day | OPHTHALMIC | Status: DC
  Administered 2023-02-09 – 2023-02-12 (×4): 1 [drp] via OPHTHALMIC
  Filled 2023-02-09: qty 2.5

## 2023-02-09 MED ORDER — SODIUM CHLORIDE 0.9 % IV SOLN: INTRAVENOUS | Status: DC | PRN

## 2023-02-09 MED ORDER — OXYCODONE HCL 5 MG PO TABS
5.0000 mg | ORAL_TABLET | Freq: Four times a day (QID) | ORAL | Status: DC | PRN
  Administered 2023-02-09 – 2023-02-13 (×9): 5 mg via ORAL
  Filled 2023-02-09 (×9): qty 1

## 2023-02-09 MED ORDER — GUAIFENESIN-DM 100-10 MG/5ML PO SYRP
5.0000 mL | ORAL_SOLUTION | ORAL | Status: DC | PRN
  Administered 2023-02-09 – 2023-02-12 (×6): 5 mL via ORAL
  Filled 2023-02-09 (×6): qty 10

## 2023-02-09 MED ORDER — IBUPROFEN 400 MG PO TABS
400.0000 mg | ORAL_TABLET | Freq: Four times a day (QID) | ORAL | Status: DC | PRN
  Administered 2023-02-10 – 2023-02-13 (×4): 400 mg via ORAL
  Filled 2023-02-09 (×4): qty 1

## 2023-02-09 MED ORDER — RISAQUAD PO CAPS
1.0000 | ORAL_CAPSULE | Freq: Every day | ORAL | Status: DC
  Administered 2023-02-09 – 2023-02-13 (×5): 1 via ORAL
  Filled 2023-02-09 (×5): qty 1

## 2023-02-09 MED ORDER — OXYCODONE HCL 5 MG PO TABS: 5.0000 mg | ORAL_TABLET | ORAL | Status: DC | PRN

## 2023-02-09 MED ORDER — TIMOLOL MALEATE 0.5 % OP SOLN
1.0000 [drp] | Freq: Every day | OPHTHALMIC | Status: DC
  Administered 2023-02-09 – 2023-02-13 (×5): 1 [drp] via OPHTHALMIC
  Filled 2023-02-09: qty 5

## 2023-02-09 MED ORDER — FINASTERIDE 5 MG PO TABS
5.0000 mg | ORAL_TABLET | Freq: Every day | ORAL | Status: DC
  Administered 2023-02-09 – 2023-02-13 (×5): 5 mg via ORAL
  Filled 2023-02-09 (×5): qty 1

## 2023-02-09 MED ORDER — ASPIRIN 81 MG PO CHEW
81.0000 mg | CHEWABLE_TABLET | Freq: Every day | ORAL | Status: DC
  Administered 2023-02-09 – 2023-02-13 (×5): 81 mg via ORAL
  Filled 2023-02-09 (×5): qty 1

## 2023-02-09 MED ORDER — MIRTAZAPINE 15 MG PO TABS
30.0000 mg | ORAL_TABLET | Freq: Every day | ORAL | Status: DC
  Administered 2023-02-09 – 2023-02-12 (×4): 30 mg via ORAL
  Filled 2023-02-09 (×4): qty 2

## 2023-02-09 MED ORDER — IPRATROPIUM-ALBUTEROL 0.5-2.5 (3) MG/3ML IN SOLN
3.0000 mL | RESPIRATORY_TRACT | Status: DC | PRN
  Administered 2023-02-10: 3 mL via RESPIRATORY_TRACT
  Filled 2023-02-09: qty 3

## 2023-02-09 MED ORDER — LORATADINE 10 MG PO TABS
10.0000 mg | ORAL_TABLET | Freq: Every day | ORAL | Status: DC
  Administered 2023-02-09 – 2023-02-13 (×5): 10 mg via ORAL
  Filled 2023-02-09 (×5): qty 1

## 2023-02-09 MED ORDER — ADULT MULTIVITAMIN W/MINERALS CH
1.0000 | ORAL_TABLET | Freq: Every day | ORAL | Status: DC
  Administered 2023-02-09 – 2023-02-13 (×5): 1 via ORAL
  Filled 2023-02-09 (×5): qty 1

## 2023-02-09 MED ORDER — TAMSULOSIN HCL 0.4 MG PO CAPS
0.4000 mg | ORAL_CAPSULE | Freq: Every day | ORAL | Status: DC
  Administered 2023-02-09 – 2023-02-13 (×5): 0.4 mg via ORAL
  Filled 2023-02-09 (×5): qty 1

## 2023-02-09 NOTE — Hospital Course (Addendum)
Brett Bender is a 77 y.o. male with a history of renal stones, BCC, BPH, DDD presents with latered mental status and concern for accidental over dose of pain meds and muscle relaxants. EMS gave him narcan. Pt has had lower back pain and rt sided pain for a few days. He had seen his PCP the day before presentation 04/01: In the ED vitals were BP of 131/65, temperature of 98.6, heart rate 63 and pulse ox 98%. WBC was 22.5, Hb 14.2, platelet 156 and creatinine 1.66.  Blood cultures were sent. CT head did not reveal any acute findings. CT angio of the chest showed no intrathoracic pathology, no PE, MRI of the lumbar spine did not reveal any discitis or osteomyelitis or abscess.Marland Kitchen.  UA was 0-5 WBC. CT scan of the abdomen in the past had revealed multiple nonobstructive small stones in the right kidney and left.  Patient was started on broad-spectrum antibiotics.  04/02: Blood cultures showed Staph aureus. ID consulted. Even though there were 2 culture sent it look like it was taken in the same site. Patient has multiple wounds on his body from nervous picking of skin. The one on the upper back was sore and he was given some topical antibiotic cream by by his dermatologist.  He also has tears on his lower extremity which is healing. Started on cefazolin.  04/03: Cr improving, WBC improving. TTE done. Cardiology consulted for TEE   04/04: no concerning findings on TTE, TEE pending. ID following.  04/05: TEE aborted d/t respiratory compromise, plan for this outpatient w/ anesthesia. PICC placed. Outpatient IV Abx plan for home - pt/wife unable to manage IV abx, daughter can help, home health can see him and educate daughter AM 04/07. Plan for discharge early morning Sunday 04/07 if he remains stable.  04/06: stable, ambulating  04/07: home health arranged, home abx plan in place. Stable for discharge.   Consultants:  Infectious disease  Cardiology  Procedures: Transesophageal Echocardiogram 02/11/23 unable to  tolerate, will need anesthesia       ASSESSMENT & PLAN:   Principal Problem:   Sepsis Active Problems:   Right flank pain   Overdose opiate, accidental or unintentional, initial encounter   Essential (primary) hypertension   Lumbosacral neuritis   BPH (benign prostatic hyperplasia)   History of nephrolithiasis   Anxiety   AKI (acute kidney injury)   Acute metabolic encephalopathy    Sepsis d/t bacteremia Staph aureus bacteremia  Bacteremic source potentially skin wounds from nervous picking at skin Sepsis criteria include fever, leukocytosis, lactic acidosis and elevated procalcitonin MRI of the lumbar spine is negative for discitis or osteomyelitis as he has degenerative disc disease Cefazolin started 04/02 to continue pending outpatient TEE Wound culture also (+)staph aureus  Plan for TEE outpatient to determine total length IV abx    Altered mental status d/t acute toxic/metabolic encephalopathy from sepsis + opioids - resolved Avoid high dose opiates and other sedating medications  Patient counseled on taking medications only as prescribed to him   Right flank pain Suspect musculoskeletal MRI lumbosacral shows multiple level degenerative changes lumbar spine with history of lumbar neuritis CT renal stone study showing renal calculi but no ureteral stones, no hydronephrosis Can trial NSAIDs with close monitoring of renal function.   cautious opiates    Overdose opiate, accidental or unintentional, initial encounter Acute metabolic encephalopathy Mental status improved with Narcan on the scene by EMS UDS --> (+)opiates no other illicits/rx   AKI (acute kidney injury) -  resolved  Suspecting acute with creatinine 1.66, most recent baseline a year ago was 1.2 Fluids IV -->  po  Monitor BMP outpatient    Anxiety Remeron nightly Holding alprazolam   History of nephrolithiasis Multiple nonobstructing renal calculi with no hydronephrosis Follow outpatient vs  repeat imaging as needed   BPH (benign prostatic hyperplasia) Continue tamsulosin and finasteride Follow w/ urology outpatient    Lumbosacral neuritis History of lumbosacral decompressive surgery Multimodal pain control with muscle relaxants, heating pad, gabapentin occasional NSAID until more awake.   Will avoid narcotics in the setting of accidental overdose   Essential (primary) hypertension Patient is normotensive Given sepsis, held home Tenoretic and amlodipine initially, BP had been normotensive but then creeping up to high, will resume      DVT prophylaxis: lovenox  Pertinent IV fluids/nutrition: no continuous IV fluids  Central lines / invasive devices: PICC  Code Status: FULL CODE   Current Admission Status: inpatient   TOC needs / Dispo plan: pending - ambulating, ok for outpatient PT. He will need long term IV abx  Barriers to discharge / significant pending items: plan d/c home tomorrow - home health unable to arrange IV abx until tomorrow

## 2023-02-09 NOTE — Progress Notes (Signed)
Date of Admission:  02/07/2023      ID: Brett Bender is a 77 y.o. male Principal Problem:   Sepsis Active Problems:   Essential (primary) hypertension   Lumbosacral neuritis   BPH (benign prostatic hyperplasia)   History of nephrolithiasis   Anxiety   AKI (acute kidney injury)   Overdose opiate, accidental or unintentional, initial encounter   Right flank pain   Acute metabolic encephalopathy    Subjective: Pt is feeling better' had echo today Still sob especially after he walked  Medications:   acidophilus  1 capsule Oral Daily   aspirin  81 mg Oral Daily   enoxaparin (LOVENOX) injection  40 mg Subcutaneous Q24H   finasteride  5 mg Oral Daily   latanoprost  1 drop Both Eyes QHS   loratadine  10 mg Oral Daily   mirtazapine  30 mg Oral QHS   multivitamin with minerals  1 tablet Oral Daily   tamsulosin  0.4 mg Oral Daily   timolol  1 drop Both Eyes Daily    Objective: Vital signs in last 24 hours: Patient Vitals for the past 24 hrs:  BP Temp Temp src Pulse Resp SpO2  02/09/23 0829 126/70 98.3 F (36.8 C) -- 61 20 91 %  02/09/23 0421 118/74 98.1 F (36.7 C) -- (!) 43 18 94 %  02/09/23 0011 114/65 (!) 97.4 F (36.3 C) -- (!) 59 18 92 %  02/08/23 2054 113/74 97.6 F (36.4 C) -- 64 20 91 %  02/08/23 1647 (!) 112/58 97.6 F (36.4 C) -- 62 20 93 %  02/08/23 1253 108/61 (!) 97.5 F (36.4 C) -- (!) 42 20 95 %  02/08/23 1220 (!) 108/58 (!) 97.5 F (36.4 C) Oral (!) 45 17 96 %  02/08/23 1200 91/61 -- -- -- -- --  02/08/23 1100 (!) 92/56 -- -- -- -- --      PHYSICAL EXAM:  General: Alert, cooperative, no distress at rest, appears stated age.  Lungs: b/l air entry Few basal crepts Heart: Regular rate and rhythm, no murmur, rub or gallop. Abdomen: Soft, non-tender,not distended. Bowel sounds normal. No masses Extremities: atraumatic, no cyanosis. No edema. No clubbing Skin: wound below the nape of neck Excoriations  Lymph: Cervical, supraclavicular  normal. Neurologic: Grossly non-focal  Lab Results    Latest Ref Rng & Units 02/09/2023    5:33 AM 02/08/2023    6:38 AM 02/07/2023   11:35 PM  CBC  WBC 4.0 - 10.5 K/uL 16.4  20.3  22.5   Hemoglobin 13.0 - 17.0 g/dL 13.6  13.2  14.2   Hematocrit 39.0 - 52.0 % 40.8  41.3  43.5   Platelets 150 - 400 K/uL 126  141  156        Latest Ref Rng & Units 02/09/2023    5:33 AM 02/08/2023    6:38 AM 02/07/2023   11:35 PM  CMP  Glucose 70 - 99 mg/dL 146   207   BUN 8 - 23 mg/dL 57   52   Creatinine 0.61 - 1.24 mg/dL 1.56  1.72  1.66   Sodium 135 - 145 mmol/L 135   135   Potassium 3.5 - 5.1 mmol/L 3.4   3.5   Chloride 98 - 111 mmol/L 101   103   CO2 22 - 32 mmol/L 24   21   Calcium 8.9 - 10.3 mg/dL 8.5   8.6   Total Protein 6.5 - 8.1 g/dL 6.1  6.8   Total Bilirubin 0.3 - 1.2 mg/dL 1.1   0.9   Alkaline Phos 38 - 126 U/L 73   73   AST 15 - 41 U/L 27   28   ALT 0 - 44 U/L 29   22       Microbiology: [facility] MSSA Studies/Results: MR Lumbar Spine W Wo Contrast  Result Date: 02/08/2023 CLINICAL DATA:  Septic with severe right-sided lumbar pain EXAM: MRI LUMBAR SPINE WITHOUT AND WITH CONTRAST TECHNIQUE: Multiplanar and multiecho pulse sequences of the lumbar spine were obtained without and with intravenous contrast. CONTRAST:  33mL GADAVIST GADOBUTROL 1 MMOL/ML IV SOLN COMPARISON:  07/24/2018 FINDINGS: Segmentation: 5 non rib-bearing vertebral bodies. The lowest fully formed disc space is labeled L5-S1. Alignment: 5 mm anterolisthesis of L4 on L5 unchanged from prior exam. 4 mm retrolisthesis of L3 on L4 appears new from the prior exam. Mild dextrocurvature. Vertebrae: No acute fracture or suspicious osseous lesion. A small amount of enhancement is noted at the inferior aspect of T12, where a Schmorl's node appears to be forming, and about the L3-L4 disc space, which correlates with edema on the STIR and is favored to be related to adjacent degenerative changes. No evidence of discitis. Postsurgical changes at  L3-L4 and L4-L5. Conus medullaris and cauda equina: Conus extends to the L2 level. Conus and cauda equina appear normal. No abnormal enhancement. No epidural collection. Paraspinal and other soft tissues: Negative. Disc levels: T12-L1: Minimal disc bulge. Mild facet arthropathy. No spinal canal stenosis or neural foraminal narrowing. L1-L2: No significant disc bulge. Mild facet arthropathy. No spinal canal stenosis or neural foraminal narrowing. L2-L3: Minimal disc bulge. Mild facet arthropathy. Ligamentum flavum hypertrophy. Mild spinal canal stenosis and mild bilateral neural foraminal narrowing, which have progressed from the prior exam. Narrowing of the lateral recesses. L3-L4: Mild retrolisthesis with disc osteophyte complex. Moderate facet arthropathy. Interval laminectomy. No residual spinal canal stenosis. Narrowing of the left lateral recess. Moderate left-greater-than-right neural foraminal narrowing, similar to prior L4-L5: Status post fusion. Grade 1 anterolisthesis with disc unroofing. Severe facet arthropathy. Moderate spinal canal stenosis, which has progressed from the prior exam. Narrowing of the lateral recesses. No neural foraminal narrowing. L5-S1: No significant disc bulge. Possible granulation tissue along the anterior aspect of the thecal sac versus extruded disc. Moderate to severe right and mild left facet arthropathy. Mild spinal canal stenosis. Narrowing of the lateral recesses. Moderate right neural foraminal narrowing, which has progressed from the prior exam IMPRESSION: 1. No evidence of discitis or osteomyelitis. No epidural collection. 2. L4-L5 moderate spinal canal stenosis, which has progressed from the prior exam. 3. L5-S1 mild spinal canal stenosis and moderate right neural foraminal narrowing, which has progressed from the prior exam. 4. L3-L4 moderate left-greater-than-right neural foraminal narrowing. 5. L2-L3 mild spinal canal stenosis and mild bilateral neural foraminal  narrowing. 6. Narrowing of the lateral recesses at L2-L3, L3-L4, L4-L5, and L5-S1 could affect the descending L3, L4, L5, and S1 nerve roots, respectively. Electronically Signed   By: Merilyn Baba M.D.   On: 02/08/2023 02:26   CT Renal Stone Study  Result Date: 02/08/2023 CLINICAL DATA:  Right back and flank pain.  Urolithiasis. EXAM: CT ABDOMEN AND PELVIS WITHOUT CONTRAST TECHNIQUE: Multidetector CT imaging of the abdomen and pelvis was performed following the standard protocol without IV contrast. RADIATION DOSE REDUCTION: This exam was performed according to the departmental dose-optimization program which includes automated exposure control, adjustment of the mA and/or kV according to patient size  and/or use of iterative reconstruction technique. COMPARISON:  05/03/2019 FINDINGS: Lower chest: No acute abnormality. Extensive coronary artery calcification Hepatobiliary: No focal liver abnormality is seen. No gallstones, gallbladder wall thickening, or biliary dilatation. Pancreas: Unremarkable Spleen: Unremarkable Adrenals/Urinary Tract: Right adrenal gland is unremarkable. 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 No follow-up imaging is recommended for this lesion. The kidneys are normal in size and position. Multiple nonobstructing calculi are seen within the kidneys bilaterally measuring up to 8 mm within the upper pole of the right kidney and 2 mm within the interpolar region of the left kidney. Stone burden within the right kidney has progressed slightly since prior examination. No hydronephrosis. No ureteral calculi. Moderate bilateral nonspecific perinephric stranding is present, progressive since prior examination. No perinephric fluid collections are seen. The bladder is decompressed and is unremarkable. Stomach/Bowel: Stomach is within normal limits. Appendix appears normal. No evidence of bowel wall thickening, distention, or inflammatory changes. Vascular/Lymphatic: Aortic  atherosclerosis. No enlarged abdominal or pelvic lymph nodes. Reproductive: Prostate is unremarkable. Other: Tiny umbilical hernia contains mesenteric fat as well as the distal tip of the appendix. Musculoskeletal: No acute bone abnormality. L4-5 anterior lumbar fusion procedure with instrumentation has been performed. No definite bridging callus identified. Advanced degenerative changes are seen at L3-4. No acute bone abnormality. No suspicious lytic or blastic bone lesion. Postsurgical changes are seen within the right iliac spine. IMPRESSION: 1. No acute intra-abdominal pathology identified. 2. Extensive coronary artery calcification. 3. 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 No follow-up imaging is recommended for this lesion. 4. Right greater than left nonobstructing nephrolithiasis. Stone burden within the right kidney has progressed slightly since prior examination. No urolithiasis. No hydronephrosis. 5. Progressive nonspecific bilateral perinephric stranding. Correlation with laboratory examination may be helpful to exclude an underlying infectious or inflammatory process. Aortic Atherosclerosis (ICD10-I70.0). Electronically Signed   By: Fidela Salisbury M.D.   On: 02/08/2023 01:11   CT Angio Chest PE W and/or Wo Contrast  Result Date: 02/08/2023 CLINICAL DATA:  Syncope. EXAM: CT ANGIOGRAPHY CHEST WITH CONTRAST TECHNIQUE: Multidetector CT imaging of the chest was performed using the standard protocol during bolus administration of intravenous contrast. Multiplanar CT image reconstructions and MIPs were obtained to evaluate the vascular anatomy. RADIATION DOSE REDUCTION: This exam was performed according to the departmental dose-optimization program which includes automated exposure control, adjustment of the mA and/or kV according to patient size and/or use of iterative reconstruction technique. CONTRAST:  79mL OMNIPAQUE IOHEXOL 350 MG/ML SOLN COMPARISON:  Chest radiograph dated 02/07/2023. FINDINGS: Evaluation  of this exam is limited due to respiratory motion artifact. Cardiovascular: There is no cardiomegaly or pericardial effusion. Coronary vascular calcification of the LAD and left circumflex artery. Mild atherosclerotic calcification of the thoracic aorta. No aneurysmal dilatation. Evaluation of the pulmonary arteries is limited due to respiratory motion and suboptimal visualization of the peripheral branches. No large central pulmonary artery embolus identified. Mediastinum/Nodes: No hilar or mediastinal adenopathy. The esophagus is grossly unremarkable. No mediastinal fluid collection. Lungs/Pleura: Bibasilar dependent atelectasis. No focal consolidation, pleural effusion, or pneumothorax. The central airways are patent. Upper Abdomen: Fatty liver.  A 2 cm left adrenal adenoma. Musculoskeletal: No acute osseous pathology. Review of the MIP images confirms the above findings. IMPRESSION: 1. No acute intrathoracic pathology. No large central pulmonary artery embolus identified. 2. Coronary vascular calcification of the LAD and left circumflex artery. 3. Fatty liver. 4. A 2 cm left adrenal adenoma. 5.  Aortic Atherosclerosis (ICD10-I70.0). Electronically Signed   By:  Anner Crete M.D.   On: 02/08/2023 01:02   CT HEAD WO CONTRAST (5MM)  Result Date: 02/08/2023 CLINICAL DATA:  Altered mental status EXAM: CT HEAD WITHOUT CONTRAST TECHNIQUE: Contiguous axial images were obtained from the base of the skull through the vertex without intravenous contrast. RADIATION DOSE REDUCTION: This exam was performed according to the departmental dose-optimization program which includes automated exposure control, adjustment of the mA and/or kV according to patient size and/or use of iterative reconstruction technique. COMPARISON:  01/05/2020 FINDINGS: Brain: Normal anatomic configuration. Parenchymal volume loss is commensurate with the patient's age. Mild periventricular white matter changes are present likely reflecting the  sequela of small vessel ischemia. No abnormal intra or extra-axial mass lesion or fluid collection. No abnormal mass effect or midline shift. No evidence of acute intracranial hemorrhage or infarct. Ventricular size is normal. Cerebellum unremarkable. Vascular: No asymmetric hyperdense vasculature at the skull base. Skull: Intact Sinuses/Orbits: Paranasal sinuses are clear. Orbits are unremarkable. Other: Mastoid air cells and middle ear cavities are clear. IMPRESSION: 1. No acute intracranial hemorrhage or infarct. 2. Mild senescent change. Electronically Signed   By: Fidela Salisbury M.D.   On: 02/08/2023 01:01   DG Chest Port 1 View  Result Date: 02/07/2023 CLINICAL DATA:  Opiate overdose, lethargy EXAM: PORTABLE CHEST 1 VIEW COMPARISON:  06/28/2019 FINDINGS: Single frontal view of the chest demonstrates an unremarkable cardiac silhouette. Continued ectasia of the thoracic aorta, likely not appreciably changed allowing for differences due to portable technique and AP positioning. Lung volumes are diminished, with patchy left basilar consolidation concerning for aspiration given clinical presentation. No effusion or pneumothorax. IMPRESSION: 1. Patchy left basilar consolidation, which could reflect hypoventilatory changes or aspiration given clinical presentation. 2. Stable thoracic aortic ectasia. Electronically Signed   By: Randa Ngo M.D.   On: 02/07/2023 23:54     Assessment/Plan:  Altered mental status is resolved now was likely a combination of pain meds and muscle relaxant   Staph aureus bacteremia.  Source unclear but could be the skin wounds - culture sent 2D echo done He will also need TEE MRI of the lumbar spine is negative for discitis or osteomyelitis  Continue cefazolin Will need for minimum 4 weeks   AKI   Leukocytosis- improving   History of lumbar spine fusion   History of renal stones in the past has been nonobstructive. May need to get a CT abdomen to look for any  changes to the kidney.  History of lithotripsy   History of BPH on tamsulosin and finasteride.   Discussed the management with the patient

## 2023-02-09 NOTE — Progress Notes (Signed)
PROGRESS NOTE    Brett Bender   [id number] DOB: 06-01-1946  DOA: 02/07/2023 Date of Service: 02/09/23 PCP: Birdie Sons, MD     Brief Narrative / Hospital Course:  Brett Bender is a 77 y.o. male with a history of renal stones, BCC, BPH, DDD presents with latered mental status and concern for accidental over dose of pain meds and muscle relaxants. EMS gave him narcan. Pt has had lower back pain and rt sided pain for a few days. He had seen his PCP the day before presentation 04/01: In the ED vitals were BP of 131/65, temperature of 98.6, heart rate 63 and pulse ox 98%. WBC was 22.5, Hb 14.2, platelet 156 and creatinine 1.66.  Blood cultures were sent. CT head did not reveal any acute findings. CT angio of the chest showed no intrathoracic pathology, no PE, MRI of the lumbar spine did not reveal any discitis or osteomyelitis or abscess.Marland Kitchen  UA was 0-5 WBC. CT scan of the abdomen in the past had revealed multiple nonobstructive small stones in the right kidney and left.  Patient was started on broad-spectrum antibiotics.  04/02: Blood cultures showed Staph aureus. ID consulted. Even though there were 2 culture sent it look like it was taken in the same site. Patient has multiple wounds on his body from nervous picking of skin. The one on the upper back was sore and he was given some topical antibiotic cream by by his dermatologist.  He also has tears on his lower extremity which is healing. Started on cefazolin.  04/03: Cr improving, WBC improving. Pend TTE, TEE. Cardiology consulted for TEE    Consultants:  Infectious disease  Cardiology  Procedures: None       ASSESSMENT & PLAN:   Principal Problem:   Sepsis Active Problems:   Right flank pain   Overdose opiate, accidental or unintentional, initial encounter   Essential (primary) hypertension   Lumbosacral neuritis   BPH (benign prostatic hyperplasia)   History of nephrolithiasis   Anxiety   AKI (acute kidney  injury)   Acute metabolic encephalopathy    Sepsis d/t bacteremia Staph aureus bacteremia  Bacteremic source potentially skin wounds from nervous picking at skin Sepsis criteria include fever, leukocytosis, lactic acidosis and elevated procalcitonin MRI of the lumbar spine is negative for discitis or osteomyelitis as he has degenerative disc disease ID consulted, appreciate abx management - was on vancomycin, cefepime and Flagyl.  Discontinued these and start cefazolin 04/02 Pending TTE, TEE (unassigned cardiology w/ [facility] Dr End notified 02/09/23 via secure chat, will put pt on their list)    Altered mental status d/t acute toxic/metabolic encephalopathy from sepsis + opioids - resolved Monitor  Avoid high dose opiates and other sedating medications   Right flank pain Suspect musculoskeletal versus GU etiology MRI lumbosacral shows multiple level degenerative changes lumbar spine with history of lumbar neuritis CT renal stone study showing renal calculi but no ureteral stones, no hydronephrosis Pain control with muscle relaxants Can trial NSAIDs with close monitoring of renal function.   cautious narcotics    Overdose opiate, accidental or unintentional, initial encounter Acute metabolic encephalopathy Mental status improved with Narcan on the scene by EMS Neuro logic checks with Narcan as needed Will get UDS   AKI (acute kidney injury) - improving  Suspecting acute with creatinine 1.66, most recent baseline a year ago was 1.2 Fluids IV -->  po  Monitor BMP   Anxiety Remeron nightly Hold alprazolam   History  of nephrolithiasis Multiple nonobstructing renal calculi with no hydronephrosis Follow outpatient vs repeat imaging as needed   BPH (benign prostatic hyperplasia) Continue tamsulosin and finasteride   Lumbosacral neuritis History of lumbosacral decompressive surgery Multimodal pain control with muscle relaxants, heating pad, gabapentin occasional NSAID until more  awake.   Will avoid narcotics in the setting of accidental overdose   Essential (primary) hypertension Patient is normotensive Given sepsis, will hold home Tenoretic and amlodipine for now     DVT prophylaxis: lovenox  Pertinent IV fluids/nutrition: no continuous IV fluids  Central lines / invasive devices: none  Code Status: FULL CODE   Current Admission Status: inpatient   TOC needs / Dispo plan: pending - ambulating, may not need PT/OT, will likely need long term IV abx  Barriers to discharge / significant pending items: TTE, TEE, IV abx plan, pend repeat BCx will be here couple more nights              Subjective / Brief ROS:  Patient reports back pain is pretty bad today  Denies CP/SOB.  Denies new weakness.  Tolerating diet.  Reports no concerns w/ urination/defecation.   Family Communication: wife at bedside on rounds     Objective Findings:  Vitals:   02/08/23 2054 02/09/23 0011 02/09/23 0421 02/09/23 0829  BP: 113/74 114/65 118/74 126/70  Pulse: 64 (!) 59 (!) 43 61  Resp: 20 18 18 20   Temp: 97.6 F (36.4 C) (!) 97.4 F (36.3 C) 98.1 F (36.7 C) 98.3 F (36.8 C)  TempSrc:      SpO2: 91% 92% 94% 91%  Weight:      Height:        Intake/Output Summary (Last 24 hours) at 02/09/2023 1143 Last data filed at 02/09/2023 1130 Gross per 24 hour  Intake 1027.59 ml  Output 2025 ml  Net -997.41 ml   Filed Weights   02/08/23 0706  Weight: 93.4 kg    Examination:  Physical Exam Constitutional:      General: He is not in acute distress.    Appearance: Normal appearance. He is not ill-appearing.  Cardiovascular:     Rate and Rhythm: Normal rate and regular rhythm.  Pulmonary:     Effort: Pulmonary effort is normal.     Breath sounds: Normal breath sounds.  Abdominal:     General: Abdomen is flat.     Palpations: Abdomen is soft.  Neurological:     General: No focal deficit present.     Mental Status: He is alert and oriented to person, place,  and time.  Psychiatric:        Mood and Affect: Mood normal.        Behavior: Behavior normal.          Scheduled Medications:   acidophilus  1 capsule Oral Daily   aspirin  81 mg Oral Daily   enoxaparin (LOVENOX) injection  40 mg Subcutaneous Q24H   finasteride  5 mg Oral Daily   latanoprost  1 drop Both Eyes QHS   loratadine  10 mg Oral Daily   mirtazapine  30 mg Oral QHS   multivitamin with minerals  1 tablet Oral Daily   tamsulosin  0.4 mg Oral Daily   timolol  1 drop Both Eyes Daily    Continuous Infusions:  sodium chloride 5 mL/hr at 02/09/23 0558    ceFAZolin (ANCEF) IV 2 g (02/09/23 0559)    PRN Medications:  sodium chloride, acetaminophen **OR** acetaminophen, diclofenac Sodium, ibuprofen,  naLOXone (NARCAN)  injection, ondansetron **OR** ondansetron (ZOFRAN) IV, oxyCODONE  Antimicrobials from admission:  Anti-infectives (From admission, onward)    Start     Dose/Rate Route Frequency Ordered Stop   02/09/23 0000  vancomycin (VANCOREADY) IVPB 1500 mg/300 mL  Status:  Discontinued        1,500 mg 150 mL/hr over 120 Minutes Intravenous Every 24 hours 02/08/23 0445 02/08/23 1621   02/08/23 2200  ceFAZolin (ANCEF) IVPB 2g/100 mL premix        2 g 200 mL/hr over 30 Minutes Intravenous Every 8 hours 02/08/23 1621     02/08/23 1200  ceFEPIme (MAXIPIME) 2 g in sodium chloride 0.9 % 100 mL IVPB  Status:  Discontinued        2 g 200 mL/hr over 30 Minutes Intravenous Every 12 hours 02/08/23 0439 02/08/23 1621   02/08/23 0600  metroNIDAZOLE (FLAGYL) IVPB 500 mg  Status:  Discontinued        500 mg 100 mL/hr over 60 Minutes Intravenous Every 12 hours 02/08/23 0431 02/08/23 1621   02/08/23 0130  ceFEPIme (MAXIPIME) 2 g in sodium chloride 0.9 % 100 mL IVPB        2 g 200 mL/hr over 30 Minutes Intravenous  Once 02/08/23 0127 02/08/23 0316   02/08/23 0130  vancomycin (VANCOREADY) IVPB 2000 mg/400 mL        2,000 mg 200 mL/hr over 120 Minutes Intravenous  Once 02/08/23 0127  02/08/23 0524           Data Reviewed:  I have personally reviewed the following...  CBC: Recent Labs  Lab 02/07/23 2335 02/08/23 0638 02/09/23 0533  WBC 22.5* 20.3* 16.4*  NEUTROABS 18.6*  --   --   HGB 14.2 13.2 13.6  HCT 43.5 41.3 40.8  MCV 93.8 96.5 91.9  PLT 156 141* [id number]*   Basic Metabolic Panel: Recent Labs  Lab 02/07/23 2335 02/08/23 0638 02/09/23 0533  NA 135  --  135  K 3.5  --  3.4*  CL 103  --  101  CO2 21*  --  24  GLUCOSE 207*  --  146*  BUN 52*  --  57*  CREATININE 1.66* 1.72* 1.56*  CALCIUM 8.6*  --  8.5*   GFR: Estimated Creatinine Clearance: 49.5 mL/min (A) (by C-G formula based on SCr of 1.56 mg/dL (H)). Liver Function Tests: Recent Labs  Lab 02/07/23 2335 02/09/23 0533  AST 28 27  ALT 22 29  ALKPHOS 73 73  BILITOT 0.9 1.1  PROT 6.8 6.1*  ALBUMIN 3.4* 2.7*   No results for input(s): "LIPASE", "AMYLASE" in the last 168 hours. No results for input(s): "AMMONIA" in the last 168 hours. Coagulation Profile: Recent Labs  Lab 02/07/23 2335  INR 1.2   Cardiac Enzymes: No results for input(s): "CKTOTAL", "CKMB", "CKMBINDEX", "TROPONINI" in the last 168 hours. BNP (last 3 results) No results for input(s): "PROBNP" in the last 8760 hours. HbA1C: No results for input(s): "HGBA1C" in the last 72 hours. CBG: No results for input(s): "GLUCAP" in the last 168 hours. Lipid Profile: No results for input(s): "CHOL", "HDL", "LDLCALC", "TRIG", "CHOLHDL", "LDLDIRECT" in the last 72 hours. Thyroid Function Tests: No results for input(s): "TSH", "T4TOTAL", "FREET4", "T3FREE", "THYROIDAB" in the last 72 hours. Anemia Panel: No results for input(s): "VITAMINB12", "FOLATE", "FERRITIN", "TIBC", "IRON", "RETICCTPCT" in the last 72 hours. Most Recent Urinalysis On File:     Component Value Date/Time   COLORURINE AMBER (A) 02/07/2023 2335   APPEARANCEUR  HAZY (A) 02/07/2023 2335   APPEARANCEUR Clear 08/07/2021 1118   LABSPEC 1.026 02/07/2023 2335    LABSPEC 1.008 02/27/2012 1139   PHURINE 5.0 02/07/2023 2335   GLUCOSEU NEGATIVE 02/07/2023 2335   GLUCOSEU Negative 02/27/2012 1139   HGBUR NEGATIVE 02/07/2023 2335   BILIRUBINUR NEGATIVE 02/07/2023 2335   BILIRUBINUR neg 02/07/2023 0946   BILIRUBINUR Negative 08/07/2021 1118   BILIRUBINUR Negative 02/27/2012 1139   KETONESUR NEGATIVE 02/07/2023 2335   PROTEINUR 100 (A) 02/07/2023 2335   UROBILINOGEN 0.2 02/07/2023 0946   UROBILINOGEN 1.0 04/28/2010 1200   NITRITE NEGATIVE 02/07/2023 2335   LEUKOCYTESUR NEGATIVE 02/07/2023 2335   LEUKOCYTESUR Negative 02/27/2012 1139   Sepsis Labs: @LABRCNTIP (procalcitonin:4,lacticidven:4) Microbiology: Recent Results (from the past 240 hour(s))  Urine Culture     Status: None   Collection Time: 02/07/23 12:00 AM   Specimen: Urine   Urine  Result Value Ref Range Status   Urine Culture, Routine Final report  Final   Organism ID, Bacteria No growth  Final  Blood Culture (routine x 2)     Status: None (Preliminary result)   Collection Time: 02/07/23 11:37 PM   Specimen: BLOOD  Result Value Ref Range Status   Specimen Description BLOOD  LEFT FOREARM  Final   Special Requests   Final    BOTTLES DRAWN AEROBIC AND ANAEROBIC Blood Culture adequate volume   Culture  Setup Time   Final    GRAM POSITIVE COCCI IN BOTH AEROBIC AND ANAEROBIC BOTTLES CRITICAL RESULT CALLED TO, READ BACK BY AND VERIFIED WITH: Montez Hageman 02/08/23 1153 MW Performed at [facility], [address] [place], [place] [address]    Culture GRAM POSITIVE COCCI  Final   Report Status PENDING  Incomplete  Blood Culture (routine x 2)     Status: None (Preliminary result)   Collection Time: 02/07/23 11:37 PM   Specimen: BLOOD  Result Value Ref Range Status   Specimen Description BLOOD  LEFT AC  Final   Special Requests   Final    BOTTLES DRAWN AEROBIC AND ANAEROBIC Blood Culture adequate volume   Culture  Setup Time   Final    GRAM POSITIVE COCCI IN BOTH AEROBIC  AND ANAEROBIC BOTTLES Organism ID to follow CRITICAL RESULT CALLED TO, READ BACK BY AND VERIFIED WITH: Montez Hageman 02/08/23 1153 MW Performed at [facility], [address] [place], [place] [address]    Culture GRAM POSITIVE COCCI  Final   Report Status PENDING  Incomplete  Blood Culture ID Panel (Reflexed)     Status: Abnormal   Collection Time: 02/07/23 11:37 PM  Result Value Ref Range Status   Enterococcus faecalis NOT DETECTED NOT DETECTED Final   Enterococcus Faecium NOT DETECTED NOT DETECTED Final   Listeria monocytogenes NOT DETECTED NOT DETECTED Final   Staphylococcus species DETECTED (A) NOT DETECTED Final    Comment: CRITICAL RESULT CALLED TO, READ BACK BY AND VERIFIED WITH: ALVERA MADUME 02/08/23 1154 MW    Staphylococcus aureus (BCID) DETECTED (A) NOT DETECTED Final    Comment: CRITICAL RESULT CALLED TO, READ BACK BY AND VERIFIED WITH: ALVERA MADUME 02/08/23 1154 MW    Staphylococcus epidermidis NOT DETECTED NOT DETECTED Final   Staphylococcus lugdunensis NOT DETECTED NOT DETECTED Final   Streptococcus species NOT DETECTED NOT DETECTED Final   Streptococcus agalactiae NOT DETECTED NOT DETECTED Final   Streptococcus pneumoniae NOT DETECTED NOT DETECTED Final   Streptococcus pyogenes NOT DETECTED NOT DETECTED Final   A.calcoaceticus-baumannii NOT DETECTED NOT DETECTED Final  Bacteroides fragilis NOT DETECTED NOT DETECTED Final   Enterobacterales NOT DETECTED NOT DETECTED Final   Enterobacter cloacae complex NOT DETECTED NOT DETECTED Final   Escherichia coli NOT DETECTED NOT DETECTED Final   Klebsiella aerogenes NOT DETECTED NOT DETECTED Final   Klebsiella oxytoca NOT DETECTED NOT DETECTED Final   Klebsiella pneumoniae NOT DETECTED NOT DETECTED Final   Proteus species NOT DETECTED NOT DETECTED Final   Salmonella species NOT DETECTED NOT DETECTED Final   Serratia marcescens NOT DETECTED NOT DETECTED Final   Haemophilus influenzae NOT DETECTED NOT DETECTED Final    Neisseria meningitidis NOT DETECTED NOT DETECTED Final   Pseudomonas aeruginosa NOT DETECTED NOT DETECTED Final   Stenotrophomonas maltophilia NOT DETECTED NOT DETECTED Final   Candida albicans NOT DETECTED NOT DETECTED Final   Candida auris NOT DETECTED NOT DETECTED Final   Candida glabrata NOT DETECTED NOT DETECTED Final   Candida krusei NOT DETECTED NOT DETECTED Final   Candida parapsilosis NOT DETECTED NOT DETECTED Final   Candida tropicalis NOT DETECTED NOT DETECTED Final   Cryptococcus neoformans/gattii NOT DETECTED NOT DETECTED Final   Meth resistant mecA/C and MREJ NOT DETECTED NOT DETECTED Final    Comment: Performed at [facility], [address] [place], [place] [address]  Resp panel by RT-PCR (RSV, Flu A&B, Covid) Anterior Nasal Swab     Status: None   Collection Time: 02/08/23  4:31 AM   Specimen: Anterior Nasal Swab  Result Value Ref Range Status   SARS Coronavirus 2 by RT PCR NEGATIVE NEGATIVE Final    Comment: (NOTE) SARS-CoV-2 target nucleic acids are NOT DETECTED.  The SARS-CoV-2 RNA is generally detectable in upper respiratory specimens during the acute phase of infection. The lowest concentration of SARS-CoV-2 viral copies this assay can detect is 138 copies/mL. A negative result does not preclude SARS-Cov-2 infection and should not be used as the sole basis for treatment or other patient management decisions. A negative result may occur with  improper specimen collection/handling, submission of specimen other than nasopharyngeal swab, presence of viral mutation(s) within the areas targeted by this assay, and inadequate number of viral copies(<138 copies/mL). A negative result must be combined with clinical observations, patient history, and epidemiological information. The expected result is Negative.  Fact Sheet for Patients:  EntrepreneurPulse.com.au  Fact Sheet for Healthcare Providers:   IncredibleEmployment.be  This test is no t yet approved or cleared by the [place] FDA and  has been authorized for detection and/or diagnosis of SARS-CoV-2 by FDA under an Emergency Use Authorization (EUA). This EUA will remain  in effect (meaning this test can be used) for the duration of the COVID-19 declaration under Section 564(b)(1) of the Act, 21 U.S.C.section 360bbb-3(b)(1), unless the authorization is terminated  or revoked sooner.       Influenza A by PCR NEGATIVE NEGATIVE Final   Influenza B by PCR NEGATIVE NEGATIVE Final    Comment: (NOTE) The Xpert Xpress SARS-CoV-2/FLU/RSV plus assay is intended as an aid in the diagnosis of influenza from Nasopharyngeal swab specimens and should not be used as a sole basis for treatment. Nasal washings and aspirates are unacceptable for Xpert Xpress SARS-CoV-2/FLU/RSV testing.  Fact Sheet for Patients: EntrepreneurPulse.com.au  Fact Sheet for Healthcare Providers: IncredibleEmployment.be  This test is not yet approved or cleared by the [place] FDA and has been authorized for detection and/or diagnosis of SARS-CoV-2 by FDA under an Emergency Use Authorization (EUA). This EUA will remain in effect (meaning this test can be used)  for the duration of the COVID-19 declaration under Section 564(b)(1) of the Act, 21 U.S.C. section 360bbb-3(b)(1), unless the authorization is terminated or revoked.     Resp Syncytial Virus by PCR NEGATIVE NEGATIVE Final    Comment: (NOTE) Fact Sheet for Patients: EntrepreneurPulse.com.au  Fact Sheet for Healthcare Providers: IncredibleEmployment.be  This test is not yet approved or cleared by the [place] FDA and has been authorized for detection and/or diagnosis of SARS-CoV-2 by FDA under an Emergency Use Authorization (EUA). This EUA will remain in effect (meaning this test can be used) for  the duration of the COVID-19 declaration under Section 564(b)(1) of the Act, 21 U.S.C. section 360bbb-3(b)(1), unless the authorization is terminated or revoked.  Performed at [facility], [place], [place] [address]   Aerobic Culture w Gram Stain (superficial specimen)     Status: None (Preliminary result)   Collection Time: 02/08/23  4:25 PM   Specimen: Wound  Result Value Ref Range Status   Specimen Description   Final    WOUND Performed at [facility], [address] [place], [place] [address]    Special Requests   Final    BACK Performed at [facility], [address] [place], [place] [address]    Gram Stain NO WBC SEEN FEW GRAM POSITIVE COCCI IN PAIRS   Final   Culture   Final    CULTURE REINCUBATED FOR BETTER GROWTH Performed at [facility], [place] [address] [place], [place] [address]    Report Status PENDING  Incomplete      Radiology Studies last 3 days: MR Lumbar Spine W Wo Contrast  Result Date: 02/08/2023 CLINICAL DATA:  Septic with severe right-sided lumbar pain EXAM: MRI LUMBAR SPINE WITHOUT AND WITH CONTRAST TECHNIQUE: Multiplanar and multiecho pulse sequences of the lumbar spine were obtained without and with intravenous contrast. CONTRAST:  18mL GADAVIST GADOBUTROL 1 MMOL/ML IV SOLN COMPARISON:  07/24/2018 FINDINGS: Segmentation: 5 non rib-bearing vertebral bodies. The lowest fully formed disc space is labeled L5-S1. Alignment: 5 mm anterolisthesis of L4 on L5 unchanged from prior exam. 4 mm retrolisthesis of L3 on L4 appears new from the prior exam. Mild dextrocurvature. Vertebrae: No acute fracture or suspicious osseous lesion. A small amount of enhancement is noted at the inferior aspect of T12, where a Schmorl's node appears to be forming, and about the L3-L4 disc space, which correlates with edema on the STIR and is favored to be related to adjacent degenerative changes. No evidence of discitis.  Postsurgical changes at L3-L4 and L4-L5. Conus medullaris and cauda equina: Conus extends to the L2 level. Conus and cauda equina appear normal. No abnormal enhancement. No epidural collection. Paraspinal and other soft tissues: Negative. Disc levels: T12-L1: Minimal disc bulge. Mild facet arthropathy. No spinal canal stenosis or neural foraminal narrowing. L1-L2: No significant disc bulge. Mild facet arthropathy. No spinal canal stenosis or neural foraminal narrowing. L2-L3: Minimal disc bulge. Mild facet arthropathy. Ligamentum flavum hypertrophy. Mild spinal canal stenosis and mild bilateral neural foraminal narrowing, which have progressed from the prior exam. Narrowing of the lateral recesses. L3-L4: Mild retrolisthesis with disc osteophyte complex. Moderate facet arthropathy. Interval laminectomy. No residual spinal canal stenosis. Narrowing of the left lateral recess. Moderate left-greater-than-right neural foraminal narrowing, similar to prior L4-L5: Status post fusion. Grade 1 anterolisthesis with disc unroofing. Severe facet arthropathy. Moderate spinal canal stenosis, which has progressed from the prior exam. Narrowing of the lateral recesses. No neural foraminal narrowing. L5-S1:  No significant disc bulge. Possible granulation tissue along the anterior aspect of the thecal sac versus extruded disc. Moderate to severe right and mild left facet arthropathy. Mild spinal canal stenosis. Narrowing of the lateral recesses. Moderate right neural foraminal narrowing, which has progressed from the prior exam IMPRESSION: 1. No evidence of discitis or osteomyelitis. No epidural collection. 2. L4-L5 moderate spinal canal stenosis, which has progressed from the prior exam. 3. L5-S1 mild spinal canal stenosis and moderate right neural foraminal narrowing, which has progressed from the prior exam. 4. L3-L4 moderate left-greater-than-right neural foraminal narrowing. 5. L2-L3 mild spinal canal stenosis and mild  bilateral neural foraminal narrowing. 6. Narrowing of the lateral recesses at L2-L3, L3-L4, L4-L5, and L5-S1 could affect the descending L3, L4, L5, and S1 nerve roots, respectively. Electronically Signed   By: Merilyn Baba M.D.   On: 02/08/2023 02:26   CT Renal Stone Study  Result Date: 02/08/2023 CLINICAL DATA:  Right back and flank pain.  Urolithiasis. EXAM: CT ABDOMEN AND PELVIS WITHOUT CONTRAST TECHNIQUE: Multidetector CT imaging of the abdomen and pelvis was performed following the standard protocol without IV contrast. RADIATION DOSE REDUCTION: This exam was performed according to the departmental dose-optimization program which includes automated exposure control, adjustment of the mA and/or kV according to patient size and/or use of iterative reconstruction technique. COMPARISON:  05/03/2019 FINDINGS: Lower chest: No acute abnormality. Extensive coronary artery calcification Hepatobiliary: No focal liver abnormality is seen. No gallstones, gallbladder wall thickening, or biliary dilatation. Pancreas: Unremarkable Spleen: Unremarkable Adrenals/Urinary Tract: Right adrenal gland is unremarkable. 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 No follow-up imaging is recommended for this lesion. The kidneys are normal in size and position. Multiple nonobstructing calculi are seen within the kidneys bilaterally measuring up to 8 mm within the upper pole of the right kidney and 2 mm within the interpolar region of the left kidney. Stone burden within the right kidney has progressed slightly since prior examination. No hydronephrosis. No ureteral calculi. Moderate bilateral nonspecific perinephric stranding is present, progressive since prior examination. No perinephric fluid collections are seen. The bladder is decompressed and is unremarkable. Stomach/Bowel: Stomach is within normal limits. Appendix appears normal. No evidence of bowel wall thickening, distention, or inflammatory changes.  Vascular/Lymphatic: Aortic atherosclerosis. No enlarged abdominal or pelvic lymph nodes. Reproductive: Prostate is unremarkable. Other: Tiny umbilical hernia contains mesenteric fat as well as the distal tip of the appendix. Musculoskeletal: No acute bone abnormality. L4-5 anterior lumbar fusion procedure with instrumentation has been performed. No definite bridging callus identified. Advanced degenerative changes are seen at L3-4. No acute bone abnormality. No suspicious lytic or blastic bone lesion. Postsurgical changes are seen within the right iliac spine. IMPRESSION: 1. No acute intra-abdominal pathology identified. 2. Extensive coronary artery calcification. 3. 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 No follow-up imaging is recommended for this lesion. 4. Right greater than left nonobstructing nephrolithiasis. Stone burden within the right kidney has progressed slightly since prior examination. No urolithiasis. No hydronephrosis. 5. Progressive nonspecific bilateral perinephric stranding. Correlation with laboratory examination may be helpful to exclude an underlying infectious or inflammatory process. Aortic Atherosclerosis (ICD10-I70.0). Electronically Signed   By: Fidela Salisbury M.D.   On: 02/08/2023 01:11   CT Angio Chest PE W and/or Wo Contrast  Result Date: 02/08/2023 CLINICAL DATA:  Syncope. EXAM: CT ANGIOGRAPHY CHEST WITH CONTRAST TECHNIQUE: Multidetector CT imaging of the chest was performed using the standard protocol during bolus administration of intravenous contrast. Multiplanar CT image reconstructions and  MIPs were obtained to evaluate the vascular anatomy. RADIATION DOSE REDUCTION: This exam was performed according to the departmental dose-optimization program which includes automated exposure control, adjustment of the mA and/or kV according to patient size and/or use of iterative reconstruction technique. CONTRAST:  69mL OMNIPAQUE IOHEXOL 350 MG/ML SOLN COMPARISON:  Chest radiograph dated  02/07/2023. FINDINGS: Evaluation of this exam is limited due to respiratory motion artifact. Cardiovascular: There is no cardiomegaly or pericardial effusion. Coronary vascular calcification of the LAD and left circumflex artery. Mild atherosclerotic calcification of the thoracic aorta. No aneurysmal dilatation. Evaluation of the pulmonary arteries is limited due to respiratory motion and suboptimal visualization of the peripheral branches. No large central pulmonary artery embolus identified. Mediastinum/Nodes: No hilar or mediastinal adenopathy. The esophagus is grossly unremarkable. No mediastinal fluid collection. Lungs/Pleura: Bibasilar dependent atelectasis. No focal consolidation, pleural effusion, or pneumothorax. The central airways are patent. Upper Abdomen: Fatty liver.  A 2 cm left adrenal adenoma. Musculoskeletal: No acute osseous pathology. Review of the MIP images confirms the above findings. IMPRESSION: 1. No acute intrathoracic pathology. No large central pulmonary artery embolus identified. 2. Coronary vascular calcification of the LAD and left circumflex artery. 3. Fatty liver. 4. A 2 cm left adrenal adenoma. 5.  Aortic Atherosclerosis (ICD10-I70.0). Electronically Signed   By: Anner Crete M.D.   On: 02/08/2023 01:02   CT HEAD WO CONTRAST (5MM)  Result Date: 02/08/2023 CLINICAL DATA:  Altered mental status EXAM: CT HEAD WITHOUT CONTRAST TECHNIQUE: Contiguous axial images were obtained from the base of the skull through the vertex without intravenous contrast. RADIATION DOSE REDUCTION: This exam was performed according to the departmental dose-optimization program which includes automated exposure control, adjustment of the mA and/or kV according to patient size and/or use of iterative reconstruction technique. COMPARISON:  01/05/2020 FINDINGS: Brain: Normal anatomic configuration. Parenchymal volume loss is commensurate with the patient's age. Mild periventricular white matter changes are  present likely reflecting the sequela of small vessel ischemia. No abnormal intra or extra-axial mass lesion or fluid collection. No abnormal mass effect or midline shift. No evidence of acute intracranial hemorrhage or infarct. Ventricular size is normal. Cerebellum unremarkable. Vascular: No asymmetric hyperdense vasculature at the skull base. Skull: Intact Sinuses/Orbits: Paranasal sinuses are clear. Orbits are unremarkable. Other: Mastoid air cells and middle ear cavities are clear. IMPRESSION: 1. No acute intracranial hemorrhage or infarct. 2. Mild senescent change. Electronically Signed   By: Fidela Salisbury M.D.   On: 02/08/2023 01:01   DG Chest Port 1 View  Result Date: 02/07/2023 CLINICAL DATA:  Opiate overdose, lethargy EXAM: PORTABLE CHEST 1 VIEW COMPARISON:  06/28/2019 FINDINGS: Single frontal view of the chest demonstrates an unremarkable cardiac silhouette. Continued ectasia of the thoracic aorta, likely not appreciably changed allowing for differences due to portable technique and AP positioning. Lung volumes are diminished, with patchy left basilar consolidation concerning for aspiration given clinical presentation. No effusion or pneumothorax. IMPRESSION: 1. Patchy left basilar consolidation, which could reflect hypoventilatory changes or aspiration given clinical presentation. 2. Stable thoracic aortic ectasia. Electronically Signed   By: Randa Ngo M.D.   On: 02/07/2023 23:54             LOS: 1 day       Emeterio Reeve, DO [facility] 02/09/2023, 11:43 AM    Dictation software may have been used to generate the above note. Typos may occur and escape review in typed/dictated notes. Please contact Dr Sheppard Coil directly for clarity if needed.  Staff may message  me via secure chat in [place]  but this may not receive an immediate response,  please page me for urgent matters!  If 7PM-7AM, please contact night coverage [email or web address]

## 2023-02-09 NOTE — Progress Notes (Signed)
*  PRELIMINARY RESULTS* Echocardiogram 2D Echocardiogram has been performed.  Brett Bender 02/09/2023, 1:32 PM

## 2023-02-10 DIAGNOSIS — R338 Other retention of urine: Secondary | ICD-10-CM

## 2023-02-10 DIAGNOSIS — G9341 Metabolic encephalopathy: Secondary | ICD-10-CM | POA: Diagnosis not present

## 2023-02-10 DIAGNOSIS — N179 Acute kidney failure, unspecified: Secondary | ICD-10-CM | POA: Diagnosis not present

## 2023-02-10 DIAGNOSIS — R7881 Bacteremia: Secondary | ICD-10-CM | POA: Diagnosis not present

## 2023-02-10 DIAGNOSIS — N401 Enlarged prostate with lower urinary tract symptoms: Secondary | ICD-10-CM | POA: Diagnosis not present

## 2023-02-10 DIAGNOSIS — B9561 Methicillin susceptible Staphylococcus aureus infection as the cause of diseases classified elsewhere: Secondary | ICD-10-CM | POA: Diagnosis not present

## 2023-02-10 LAB — BASIC METABOLIC PANEL
Anion gap: 10 (ref 5–15)
BUN: 43 mg/dL — ABNORMAL HIGH (ref 8–23)
CO2: 24 mmol/L (ref 22–32)
Calcium: 8.2 mg/dL — ABNORMAL LOW (ref 8.9–10.3)
Chloride: 103 mmol/L (ref 98–111)
Creatinine, Ser: 1.28 mg/dL — ABNORMAL HIGH (ref 0.61–1.24)
GFR, Estimated: 58 mL/min — ABNORMAL LOW (ref >60)
Glucose, Bld: 120 mg/dL — ABNORMAL HIGH (ref 70–99)
Potassium: 3.2 mmol/L — ABNORMAL LOW (ref 3.5–5.1)
Sodium: 137 mmol/L (ref 135–145)

## 2023-02-10 LAB — CULTURE, BLOOD (ROUTINE X 2)
Special Requests: ADEQUATE
Special Requests: ADEQUATE

## 2023-02-10 MED ORDER — POTASSIUM CHLORIDE CRYS ER 20 MEQ PO TBCR
40.0000 meq | EXTENDED_RELEASE_TABLET | Freq: Two times a day (BID) | ORAL | Status: AC
  Administered 2023-02-10 (×2): 40 meq via ORAL
  Filled 2023-02-10 (×2): qty 2

## 2023-02-10 MED ORDER — MAGNESIUM HYDROXIDE 400 MG/5ML PO SUSP
15.0000 mL | Freq: Every day | ORAL | Status: AC
  Administered 2023-02-10: 15 mL via ORAL
  Filled 2023-02-10: qty 30

## 2023-02-10 MED ORDER — CHLORHEXIDINE GLUCONATE CLOTH 2 % EX PADS
6.0000 | MEDICATED_PAD | Freq: Every day | CUTANEOUS | Status: DC
  Administered 2023-02-10 – 2023-02-12 (×3): 6 via TOPICAL

## 2023-02-10 MED ORDER — POLYETHYLENE GLYCOL 3350 17 G PO PACK
17.0000 g | PACK | Freq: Two times a day (BID) | ORAL | Status: AC
  Administered 2023-02-10 (×2): 17 g via ORAL
  Filled 2023-02-10 (×2): qty 1

## 2023-02-10 NOTE — Progress Notes (Signed)
PROGRESS NOTE    Brett Bender   [id number] DOB: 19-Jul-1946  DOA: 02/07/2023 Date of Service: 02/10/23 PCP: Birdie Sons, MD     Brief Narrative / Hospital Course:  Brett Bender is a 77 y.o. male with a history of renal stones, BCC, BPH, DDD presents with latered mental status and concern for accidental over dose of pain meds and muscle relaxants. EMS gave him narcan. Pt has had lower back pain and rt sided pain for a few days. He had seen his PCP the day before presentation 04/01: In the ED vitals were BP of 131/65, temperature of 98.6, heart rate 63 and pulse ox 98%. WBC was 22.5, Hb 14.2, platelet 156 and creatinine 1.66.  Blood cultures were sent. CT head did not reveal any acute findings. CT angio of the chest showed no intrathoracic pathology, no PE, MRI of the lumbar spine did not reveal any discitis or osteomyelitis or abscess.Marland Kitchen  UA was 0-5 WBC. CT scan of the abdomen in the past had revealed multiple nonobstructive small stones in the right kidney and left.  Patient was started on broad-spectrum antibiotics.  04/02: Blood cultures showed Staph aureus. ID consulted. Even though there were 2 culture sent it look like it was taken in the same site. Patient has multiple wounds on his body from nervous picking of skin. The one on the upper back was sore and he was given some topical antibiotic cream by by his dermatologist.  He also has tears on his lower extremity which is healing. Started on cefazolin.  04/03: Cr improving, WBC improving. TTE done. Cardiology consulted for TEE   04/04: no concerning findings on TTE, TEE pending today. ID following.   Consultants:  Infectious disease  Cardiology  Procedures: None       ASSESSMENT & PLAN:   Principal Problem:   Sepsis Active Problems:   Right flank pain   Overdose opiate, accidental or unintentional, initial encounter   Essential (primary) hypertension   Lumbosacral neuritis   BPH (benign prostatic hyperplasia)    History of nephrolithiasis   Anxiety   AKI (acute kidney injury)   Acute metabolic encephalopathy    Sepsis d/t bacteremia Staph aureus bacteremia  Bacteremic source potentially skin wounds from nervous picking at skin Sepsis criteria include fever, leukocytosis, lactic acidosis and elevated procalcitonin MRI of the lumbar spine is negative for discitis or osteomyelitis as he has degenerative disc disease ID consulted, appreciate abx management - was on vancomycin, cefepime and Flagyl.  Discontinued these and start cefazolin 04/02 Wound culture also (+)staph aureus  Pending TEE   Altered mental status d/t acute toxic/metabolic encephalopathy from sepsis + opioids - resolved Monitor  Avoid high dose opiates and other sedating medications   Right flank pain Suspect musculoskeletal versus GU etiology MRI lumbosacral shows multiple level degenerative changes lumbar spine with history of lumbar neuritis CT renal stone study showing renal calculi but no ureteral stones, no hydronephrosis Can trial NSAIDs with close monitoring of renal function.   cautious narcotics    Overdose opiate, accidental or unintentional, initial encounter Acute metabolic encephalopathy Mental status improved with Narcan on the scene by EMS UDS --> (+)opiates no other illicits/rx   AKI (acute kidney injury) - improving  Suspecting acute with creatinine 1.66, most recent baseline a year ago was 1.2 Fluids IV -->  po  Monitor BMP   Anxiety Remeron nightly Hold alprazolam   History of nephrolithiasis Multiple nonobstructing renal calculi with no hydronephrosis Follow outpatient  vs repeat imaging as needed   BPH (benign prostatic hyperplasia) Continue tamsulosin and finasteride   Lumbosacral neuritis History of lumbosacral decompressive surgery Multimodal pain control with muscle relaxants, heating pad, gabapentin occasional NSAID until more awake.   Will avoid narcotics in the setting of accidental  overdose   Essential (primary) hypertension Patient is normotensive Given sepsis, held home Tenoretic and amlodipine for now, BP has been normotensive      DVT prophylaxis: lovenox  Pertinent IV fluids/nutrition: no continuous IV fluids  Central lines / invasive devices: none  Code Status: FULL CODE   Current Admission Status: inpatient   TOC needs / Dispo plan: pending - ambulating, may not need PT/OT, will likely need long term IV abx  Barriers to discharge / significant pending items: TEE, IV abx plan, pend repeat BCx will be here 1-2 more nights              Subjective / Brief ROS:  Patient reports back pain is better today  Denies CP/SOB.  Denies new weakness.  Tolerating diet.  Reports no concerns w/ urination/defecation.   Family Communication: wife at bedside on rounds     Objective Findings:  Vitals:   02/09/23 2332 02/10/23 0358 02/10/23 0832 02/10/23 1112  BP: 117/67 112/68 134/78 127/77  Pulse: (!) 50 (!) 54 61 (!) 58  Resp: 20 20 18 18   Temp: 98.2 F (36.8 C) 98.3 F (36.8 C) 98.4 F (36.9 C) 98.3 F (36.8 C)  TempSrc: Oral   Oral  SpO2: 94% 93% 96% 93%  Weight:      Height:        Intake/Output Summary (Last 24 hours) at 02/10/2023 1450 Last data filed at 02/10/2023 1344 Gross per 24 hour  Intake 1400 ml  Output 3250 ml  Net -1850 ml   Filed Weights   02/08/23 0706  Weight: 93.4 kg    Examination:  Physical Exam Constitutional:      General: He is not in acute distress.    Appearance: Normal appearance. He is not ill-appearing.  Cardiovascular:     Rate and Rhythm: Normal rate and regular rhythm.  Pulmonary:     Effort: Pulmonary effort is normal.     Breath sounds: Normal breath sounds.  Abdominal:     General: Abdomen is flat.     Palpations: Abdomen is soft.  Neurological:     General: No focal deficit present.     Mental Status: He is alert and oriented to person, place, and time.  Psychiatric:        Mood and  Affect: Mood normal.        Behavior: Behavior normal.          Scheduled Medications:   acidophilus  1 capsule Oral Daily   aspirin  81 mg Oral Daily   Chlorhexidine Gluconate Cloth  6 each Topical Daily   enoxaparin (LOVENOX) injection  40 mg Subcutaneous Q24H   finasteride  5 mg Oral Daily   latanoprost  1 drop Both Eyes QHS   loratadine  10 mg Oral Daily   mirtazapine  30 mg Oral QHS   multivitamin with minerals  1 tablet Oral Daily   polyethylene glycol  17 g Oral BID   tamsulosin  0.4 mg Oral Daily   timolol  1 drop Both Eyes Daily    Continuous Infusions:  sodium chloride 10 mL/hr at 02/10/23 0557    ceFAZolin (ANCEF) IV 2 g (02/10/23 1341)    PRN  Medications:  sodium chloride, acetaminophen **OR** acetaminophen, diclofenac Sodium, guaiFENesin-dextromethorphan, ibuprofen, ipratropium-albuterol, naLOXone (NARCAN)  injection, ondansetron **OR** ondansetron (ZOFRAN) IV, oxyCODONE  Antimicrobials from admission:  Anti-infectives (From admission, onward)    Start     Dose/Rate Route Frequency Ordered Stop   02/09/23 0000  vancomycin (VANCOREADY) IVPB 1500 mg/300 mL  Status:  Discontinued        1,500 mg 150 mL/hr over 120 Minutes Intravenous Every 24 hours 02/08/23 0445 02/08/23 1621   02/08/23 2200  ceFAZolin (ANCEF) IVPB 2g/100 mL premix        2 g 200 mL/hr over 30 Minutes Intravenous Every 8 hours 02/08/23 1621     02/08/23 1200  ceFEPIme (MAXIPIME) 2 g in sodium chloride 0.9 % 100 mL IVPB  Status:  Discontinued        2 g 200 mL/hr over 30 Minutes Intravenous Every 12 hours 02/08/23 0439 02/08/23 1621   02/08/23 0600  metroNIDAZOLE (FLAGYL) IVPB 500 mg  Status:  Discontinued        500 mg 100 mL/hr over 60 Minutes Intravenous Every 12 hours 02/08/23 0431 02/08/23 1621   02/08/23 0130  ceFEPIme (MAXIPIME) 2 g in sodium chloride 0.9 % 100 mL IVPB        2 g 200 mL/hr over 30 Minutes Intravenous  Once 02/08/23 0127 02/08/23 0316   02/08/23 0130  vancomycin  (VANCOREADY) IVPB 2000 mg/400 mL        2,000 mg 200 mL/hr over 120 Minutes Intravenous  Once 02/08/23 0127 02/08/23 0524           Data Reviewed:  I have personally reviewed the following...  CBC: Recent Labs  Lab 02/07/23 2335 02/08/23 0638 02/09/23 0533  WBC 22.5* 20.3* 16.4*  NEUTROABS 18.6*  --   --   HGB 14.2 13.2 13.6  HCT 43.5 41.3 40.8  MCV 93.8 96.5 91.9  PLT 156 141* 123XX123*   Basic Metabolic Panel: Recent Labs  Lab 02/07/23 2335 02/08/23 0638 02/09/23 0533 02/10/23 0506  NA 135  --  135 137  K 3.5  --  3.4* 3.2*  CL 103  --  101 103  CO2 21*  --  24 24  GLUCOSE 207*  --  146* 120*  BUN 52*  --  57* 43*  CREATININE 1.66* 1.72* 1.56* 1.28*  CALCIUM 8.6*  --  8.5* 8.2*   GFR: Estimated Creatinine Clearance: 60.3 mL/min (A) (by C-G formula based on SCr of 1.28 mg/dL (H)). Liver Function Tests: Recent Labs  Lab 02/07/23 2335 02/09/23 0533  AST 28 27  ALT 22 29  ALKPHOS 73 73  BILITOT 0.9 1.1  PROT 6.8 6.1*  ALBUMIN 3.4* 2.7*   No results for input(s): "LIPASE", "AMYLASE" in the last 168 hours. No results for input(s): "AMMONIA" in the last 168 hours. Coagulation Profile: Recent Labs  Lab 02/07/23 2335  INR 1.2   Cardiac Enzymes: No results for input(s): "CKTOTAL", "CKMB", "CKMBINDEX", "TROPONINI" in the last 168 hours. BNP (last 3 results) No results for input(s): "PROBNP" in the last 8760 hours. HbA1C: No results for input(s): "HGBA1C" in the last 72 hours. CBG: No results for input(s): "GLUCAP" in the last 168 hours. Lipid Profile: No results for input(s): "CHOL", "HDL", "LDLCALC", "TRIG", "CHOLHDL", "LDLDIRECT" in the last 72 hours. Thyroid Function Tests: No results for input(s): "TSH", "T4TOTAL", "FREET4", "T3FREE", "THYROIDAB" in the last 72 hours. Anemia Panel: No results for input(s): "VITAMINB12", "FOLATE", "FERRITIN", "TIBC", "IRON", "RETICCTPCT" in the last 72 hours.  Most Recent Urinalysis On File:     Component Value  Date/Time   COLORURINE AMBER (A) 02/07/2023 2335   APPEARANCEUR HAZY (A) 02/07/2023 2335   APPEARANCEUR Clear 08/07/2021 1118   LABSPEC 1.026 02/07/2023 2335   LABSPEC 1.008 02/27/2012 1139   PHURINE 5.0 02/07/2023 2335   GLUCOSEU NEGATIVE 02/07/2023 2335   GLUCOSEU Negative 02/27/2012 1139   HGBUR NEGATIVE 02/07/2023 2335   BILIRUBINUR NEGATIVE 02/07/2023 2335   BILIRUBINUR neg 02/07/2023 0946   BILIRUBINUR Negative 08/07/2021 1118   BILIRUBINUR Negative 02/27/2012 1139   KETONESUR NEGATIVE 02/07/2023 2335   PROTEINUR 100 (A) 02/07/2023 2335   UROBILINOGEN 0.2 02/07/2023 0946   UROBILINOGEN 1.0 04/28/2010 1200   NITRITE NEGATIVE 02/07/2023 2335   LEUKOCYTESUR NEGATIVE 02/07/2023 2335   LEUKOCYTESUR Negative 02/27/2012 1139   Sepsis Labs: @LABRCNTIP (procalcitonin:4,lacticidven:4) Microbiology: Recent Results (from the past 240 hour(s))  Urine Culture     Status: None   Collection Time: 02/07/23 12:00 AM   Specimen: Urine   Urine  Result Value Ref Range Status   Urine Culture, Routine Final report  Final   Organism ID, Bacteria No growth  Final  Urine Culture (for pregnant, neutropenic or urologic patients or patients with an indwelling urinary catheter)     Status: Abnormal   Collection Time: 02/07/23 12:19 PM   Specimen: Urine, Clean Catch  Result Value Ref Range Status   Specimen Description   Final    URINE, CLEAN CATCH Performed at [facility], [address], [place], [place] [address]    Special Requests   Final    NONE Performed at [facility], [address], [place], [place] [address]    Culture (A)  Final    <10,000 COLONIES/mL INSIGNIFICANT GROWTH Performed at [facility], [place] [address], [place], [place] [address]    Report Status 02/09/2023 FINAL  Final  Blood Culture (routine x 2)     Status: Abnormal   Collection Time: 02/07/23 11:37 PM   Specimen: BLOOD  Result Value Ref Range Status   Specimen Description   Final     BLOOD  LEFT FOREARM Performed at [facility], [address], [place], [place] [address]    Special Requests   Final    BOTTLES DRAWN AEROBIC AND ANAEROBIC Blood Culture adequate volume Performed at [facility], [address], [place], [place] [address]    Culture  Setup Time   Final    GRAM POSITIVE COCCI IN BOTH AEROBIC AND ANAEROBIC BOTTLES CRITICAL RESULT CALLED TO, READ BACK BY AND VERIFIED WITH: Montez Hageman 02/08/23 1153 MW Performed at [facility], [address] [place], [place] [address]    Culture (A)  Final    STAPHYLOCOCCUS AUREUS SUSCEPTIBILITIES PERFORMED ON PREVIOUS CULTURE WITHIN THE LAST 5 DAYS. Performed at [facility], [place] [address] [place], [place] [address]    Report Status 02/10/2023 FINAL  Final  Blood Culture (routine x 2)     Status: Abnormal   Collection Time: 02/07/23 11:37 PM   Specimen: BLOOD  Result Value Ref Range Status   Specimen Description   Final    BLOOD  LEFT AC Performed at [facility], [address] [place], [place] [address]    Special Requests   Final    BOTTLES DRAWN AEROBIC AND ANAEROBIC Blood Culture adequate volume Performed at [facility], [address], [place], [place] [address]    Culture  Setup Time   Final  GRAM POSITIVE COCCI IN BOTH AEROBIC AND ANAEROBIC BOTTLES Organism ID to follow CRITICAL RESULT CALLED TO, READ BACK BY AND VERIFIED WITH: Montez Hageman 02/08/23 1153 MW Performed at [facility], [address] [place], [place] [address]    Culture STAPHYLOCOCCUS AUREUS (A)  Final   Report Status 02/10/2023 FINAL  Final   Organism ID, Bacteria STAPHYLOCOCCUS AUREUS  Final      Susceptibility   Staphylococcus aureus - MIC*    CIPROFLOXACIN <=0.5 SENSITIVE Sensitive     ERYTHROMYCIN >=8 RESISTANT Resistant     GENTAMICIN <=0.5 SENSITIVE Sensitive     OXACILLIN 0.5 SENSITIVE Sensitive     TETRACYCLINE <=1 SENSITIVE Sensitive      VANCOMYCIN 1 SENSITIVE Sensitive     TRIMETH/SULFA <=10 SENSITIVE Sensitive     CLINDAMYCIN <=0.25 SENSITIVE Sensitive     RIFAMPIN <=0.5 SENSITIVE Sensitive     Inducible Clindamycin NEGATIVE Sensitive     * STAPHYLOCOCCUS AUREUS  Blood Culture ID Panel (Reflexed)     Status: Abnormal   Collection Time: 02/07/23 11:37 PM  Result Value Ref Range Status   Enterococcus faecalis NOT DETECTED NOT DETECTED Final   Enterococcus Faecium NOT DETECTED NOT DETECTED Final   Listeria monocytogenes NOT DETECTED NOT DETECTED Final   Staphylococcus species DETECTED (A) NOT DETECTED Final    Comment: CRITICAL RESULT CALLED TO, READ BACK BY AND VERIFIED WITH: ALVERA MADUME 02/08/23 1154 MW    Staphylococcus aureus (BCID) DETECTED (A) NOT DETECTED Final    Comment: CRITICAL RESULT CALLED TO, READ BACK BY AND VERIFIED WITH: ALVERA MADUME 02/08/23 1154 MW    Staphylococcus epidermidis NOT DETECTED NOT DETECTED Final   Staphylococcus lugdunensis NOT DETECTED NOT DETECTED Final   Streptococcus species NOT DETECTED NOT DETECTED Final   Streptococcus agalactiae NOT DETECTED NOT DETECTED Final   Streptococcus pneumoniae NOT DETECTED NOT DETECTED Final   Streptococcus pyogenes NOT DETECTED NOT DETECTED Final   A.calcoaceticus-baumannii NOT DETECTED NOT DETECTED Final   Bacteroides fragilis NOT DETECTED NOT DETECTED Final   Enterobacterales NOT DETECTED NOT DETECTED Final   Enterobacter cloacae complex NOT DETECTED NOT DETECTED Final   Escherichia coli NOT DETECTED NOT DETECTED Final   Klebsiella aerogenes NOT DETECTED NOT DETECTED Final   Klebsiella oxytoca NOT DETECTED NOT DETECTED Final   Klebsiella pneumoniae NOT DETECTED NOT DETECTED Final   Proteus species NOT DETECTED NOT DETECTED Final   Salmonella species NOT DETECTED NOT DETECTED Final   Serratia marcescens NOT DETECTED NOT DETECTED Final   Haemophilus influenzae NOT DETECTED NOT DETECTED Final   Neisseria meningitidis NOT DETECTED NOT DETECTED  Final   Pseudomonas aeruginosa NOT DETECTED NOT DETECTED Final   Stenotrophomonas maltophilia NOT DETECTED NOT DETECTED Final   Candida albicans NOT DETECTED NOT DETECTED Final   Candida auris NOT DETECTED NOT DETECTED Final   Candida glabrata NOT DETECTED NOT DETECTED Final   Candida krusei NOT DETECTED NOT DETECTED Final   Candida parapsilosis NOT DETECTED NOT DETECTED Final   Candida tropicalis NOT DETECTED NOT DETECTED Final   Cryptococcus neoformans/gattii NOT DETECTED NOT DETECTED Final   Meth resistant mecA/C and MREJ NOT DETECTED NOT DETECTED Final    Comment: Performed at [facility], [address] [place], [place] [address]  Resp panel by RT-PCR (RSV, Flu A&B, Covid) Anterior Nasal Swab     Status: None   Collection Time: 02/08/23  4:31 AM   Specimen: Anterior Nasal Swab  Result Value Ref Range Status   SARS Coronavirus 2 by  RT PCR NEGATIVE NEGATIVE Final    Comment: (NOTE) SARS-CoV-2 target nucleic acids are NOT DETECTED.  The SARS-CoV-2 RNA is generally detectable in upper respiratory specimens during the acute phase of infection. The lowest concentration of SARS-CoV-2 viral copies this assay can detect is 138 copies/mL. A negative result does not preclude SARS-Cov-2 infection and should not be used as the sole basis for treatment or other patient management decisions. A negative result may occur with  improper specimen collection/handling, submission of specimen other than nasopharyngeal swab, presence of viral mutation(s) within the areas targeted by this assay, and inadequate number of viral copies(<138 copies/mL). A negative result must be combined with clinical observations, patient history, and epidemiological information. The expected result is Negative.  Fact Sheet for Patients:  EntrepreneurPulse.com.au  Fact Sheet for Healthcare Providers:  IncredibleEmployment.be  This test is no t yet approved or cleared by  the [place] FDA and  has been authorized for detection and/or diagnosis of SARS-CoV-2 by FDA under an Emergency Use Authorization (EUA). This EUA will remain  in effect (meaning this test can be used) for the duration of the COVID-19 declaration under Section 564(b)(1) of the Act, 21 U.S.C.section 360bbb-3(b)(1), unless the authorization is terminated  or revoked sooner.       Influenza A by PCR NEGATIVE NEGATIVE Final   Influenza B by PCR NEGATIVE NEGATIVE Final    Comment: (NOTE) The Xpert Xpress SARS-CoV-2/FLU/RSV plus assay is intended as an aid in the diagnosis of influenza from Nasopharyngeal swab specimens and should not be used as a sole basis for treatment. Nasal washings and aspirates are unacceptable for Xpert Xpress SARS-CoV-2/FLU/RSV testing.  Fact Sheet for Patients: EntrepreneurPulse.com.au  Fact Sheet for Healthcare Providers: IncredibleEmployment.be  This test is not yet approved or cleared by the [place] FDA and has been authorized for detection and/or diagnosis of SARS-CoV-2 by FDA under an Emergency Use Authorization (EUA). This EUA will remain in effect (meaning this test can be used) for the duration of the COVID-19 declaration under Section 564(b)(1) of the Act, 21 U.S.C. section 360bbb-3(b)(1), unless the authorization is terminated or revoked.     Resp Syncytial Virus by PCR NEGATIVE NEGATIVE Final    Comment: (NOTE) Fact Sheet for Patients: EntrepreneurPulse.com.au  Fact Sheet for Healthcare Providers: IncredibleEmployment.be  This test is not yet approved or cleared by the [place] FDA and has been authorized for detection and/or diagnosis of SARS-CoV-2 by FDA under an Emergency Use Authorization (EUA). This EUA will remain in effect (meaning this test can be used) for the duration of the COVID-19 declaration under Section 564(b)(1) of the Act, 21  U.S.C. section 360bbb-3(b)(1), unless the authorization is terminated or revoked.  Performed at [facility], [place], [place] [address]   Aerobic Culture w Gram Stain (superficial specimen)     Status: None (Preliminary result)   Collection Time: 02/08/23  4:25 PM   Specimen: Wound  Result Value Ref Range Status   Specimen Description   Final    WOUND Performed at [facility], [address], [place], [place] [address]    Special Requests   Final    BACK Performed at [facility], [address] [place], [place] [address]    Gram Stain   Final    NO WBC SEEN FEW GRAM POSITIVE COCCI IN PAIRS Performed at [facility], [place] [address], [place],  [address]    Culture MODERATE STAPHYLOCOCCUS AUREUS  Final  Report Status PENDING  Incomplete   Organism ID, Bacteria STAPHYLOCOCCUS AUREUS  Final      Susceptibility   Staphylococcus aureus - MIC*    CIPROFLOXACIN <=0.5 SENSITIVE Sensitive     ERYTHROMYCIN >=8 RESISTANT Resistant     GENTAMICIN <=0.5 SENSITIVE Sensitive     OXACILLIN 0.5 SENSITIVE Sensitive     TETRACYCLINE <=1 SENSITIVE Sensitive     VANCOMYCIN 1 SENSITIVE Sensitive     TRIMETH/SULFA <=10 SENSITIVE Sensitive     CLINDAMYCIN <=0.25 SENSITIVE Sensitive     RIFAMPIN <=0.5 SENSITIVE Sensitive     Inducible Clindamycin NEGATIVE Sensitive     * MODERATE STAPHYLOCOCCUS AUREUS      Radiology Studies last 3 days: ECHOCARDIOGRAM COMPLETE  Result Date: 02/09/2023    ECHOCARDIOGRAM REPORT   Patient Name:   MARKHAM STRAHL Date of Exam: 02/09/2023 Medical Rec #:  [id number]      Height:       76.0 in Accession #:    [id number]     Weight:       205.9 lb Date of Birth:  August 27, 1946     BSA:          2.243 m Patient Age:    55 years       BP:           121/69 mmHg Patient Gender: M              HR:           65 bpm. Exam Location:  [facility] Procedure: 2D Echo, Cardiac Doppler and Color Doppler Indications:     Bacteremia   History:         Patient has no prior history of Echocardiogram examinations.                  Signs/Symptoms:Bacteremia; Risk Factors:Hypertension.  Sonographer:     Wenda Low Referring Phys:  [id number] Rise Mu Diagnosing Phys: Kate Sable MD  Sonographer Comments: Image acquisition challenging due to respiratory motion. IMPRESSIONS  1. Left ventricular ejection fraction, by estimation, is 60 to 65%. The left ventricle has normal function. The left ventricle has no regional wall motion abnormalities. Left ventricular diastolic parameters are consistent with Grade I diastolic dysfunction (impaired relaxation).  2. Right ventricular systolic function is normal. The right ventricular size is normal. There is moderately elevated pulmonary artery systolic pressure.  3. The mitral valve is normal in structure. Trivial mitral valve regurgitation.  4. The aortic valve was not well visualized. Aortic valve regurgitation is trivial.  5. Aortic dilatation noted. There is mild dilatation of the aortic root, measuring 40 mm. FINDINGS  Left Ventricle: Left ventricular ejection fraction, by estimation, is 60 to 65%. The left ventricle has normal function. The left ventricle has no regional wall motion abnormalities. The left ventricular internal cavity size was normal in size. There is  no left ventricular hypertrophy. Left ventricular diastolic parameters are consistent with Grade I diastolic dysfunction (impaired relaxation). Right Ventricle: The right ventricular size is normal. No increase in right ventricular wall thickness. Right ventricular systolic function is normal. There is moderately elevated pulmonary artery systolic pressure. The tricuspid regurgitant velocity is 3.29 m/s, and with an assumed right atrial pressure of 8 mmHg, the estimated right ventricular systolic pressure is [id number] mmHg. Left Atrium: Left atrial size was normal in size. Right Atrium: Right atrial size was normal in size. Pericardium:  There is no evidence of pericardial effusion. Mitral Valve: The mitral valve  is normal in structure. Trivial mitral valve regurgitation. MV peak gradient, 4.6 mmHg. The mean mitral valve gradient is 1.0 mmHg. Tricuspid Valve: The tricuspid valve is normal in structure. Tricuspid valve regurgitation is mild. Aortic Valve: The aortic valve was not well visualized. Aortic valve regurgitation is trivial. Aortic valve mean gradient measures 7.0 mmHg. Aortic valve peak gradient measures 13.1 mmHg. Aortic valve area, by VTI measures 2.52 cm. Pulmonic Valve: The pulmonic valve was not well visualized. Pulmonic valve regurgitation is not visualized. Aorta: Aortic dilatation noted. There is mild dilatation of the aortic root, measuring 40 mm. Venous: The inferior vena cava was not well visualized. IAS/Shunts: No atrial level shunt detected by color flow Doppler.  LEFT VENTRICLE PLAX 2D LVIDd:         5.10 cm   Diastology LVIDs:         3.20 cm   LV e' medial:    7.29 cm/s LV PW:         1.10 cm   LV E/e' medial:  9.4 LV IVS:        1.20 cm   LV e' lateral:   11.50 cm/s LVOT diam:     1.90 cm   LV E/e' lateral: 6.0 LV SV:         99 LV SV Index:   44 LVOT Area:     2.84 cm  RIGHT VENTRICLE RV Basal diam:  3.75 cm RV Mid diam:    3.00 cm RV S prime:     16.80 cm/s TAPSE (M-mode): 2.9 cm LEFT ATRIUM             Index        RIGHT ATRIUM           Index LA diam:        4.50 cm 2.01 cm/m   RA Area:     20.20 cm LA Vol (A2C):   84.6 ml 37.72 ml/m  RA Volume:   57.00 ml  25.41 ml/m LA Vol (A4C):   53.5 ml 23.85 ml/m LA Biplane Vol: 71.3 ml 31.79 ml/m  AORTIC VALVE                     PULMONIC VALVE AV Area (Vmax):    2.48 cm      PV Vmax:       1.03 m/s AV Area (Vmean):   2.25 cm      PV Peak grad:  4.2 mmHg AV Area (VTI):     2.52 cm AV Vmax:           181.00 cm/s AV Vmean:          125.000 cm/s AV VTI:            0.392 m AV Peak Grad:      13.1 mmHg AV Mean Grad:      7.0 mmHg LVOT Vmax:         158.00 cm/s LVOT Vmean:         99.100 cm/s LVOT VTI:          0.348 m LVOT/AV VTI ratio: 0.89  AORTA Ao Root diam: 4.00 cm Ao Asc diam:  3.60 cm MITRAL VALVE               TRICUSPID VALVE MV Area (PHT): 3.16 cm    TR Peak grad:   43.3 mmHg MV Area VTI:   3.08 cm    TR Vmax:  329.00 cm/s MV Peak grad:  4.6 mmHg MV Mean grad:  1.0 mmHg    SHUNTS MV Vmax:       1.07 m/s    Systemic VTI:  0.35 m MV Vmean:      52.1 cm/s   Systemic Diam: 1.90 cm MV Decel Time: 240 msec MV E velocity: 68.70 cm/s MV A velocity: 99.00 cm/s MV E/A ratio:  0.69 Kate Sable MD Electronically signed by Kate Sable MD Signature Date/Time: 02/09/2023/1:38:38 PM    Final    MR Lumbar Spine W Wo Contrast  Result Date: 02/08/2023 CLINICAL DATA:  Septic with severe right-sided lumbar pain EXAM: MRI LUMBAR SPINE WITHOUT AND WITH CONTRAST TECHNIQUE: Multiplanar and multiecho pulse sequences of the lumbar spine were obtained without and with intravenous contrast. CONTRAST:  24mL GADAVIST GADOBUTROL 1 MMOL/ML IV SOLN COMPARISON:  07/24/2018 FINDINGS: Segmentation: 5 non rib-bearing vertebral bodies. The lowest fully formed disc space is labeled L5-S1. Alignment: 5 mm anterolisthesis of L4 on L5 unchanged from prior exam. 4 mm retrolisthesis of L3 on L4 appears new from the prior exam. Mild dextrocurvature. Vertebrae: No acute fracture or suspicious osseous lesion. A small amount of enhancement is noted at the inferior aspect of T12, where a Schmorl's node appears to be forming, and about the L3-L4 disc space, which correlates with edema on the STIR and is favored to be related to adjacent degenerative changes. No evidence of discitis. Postsurgical changes at L3-L4 and L4-L5. Conus medullaris and cauda equina: Conus extends to the L2 level. Conus and cauda equina appear normal. No abnormal enhancement. No epidural collection. Paraspinal and other soft tissues: Negative. Disc levels: T12-L1: Minimal disc bulge. Mild facet arthropathy. No spinal canal stenosis  or neural foraminal narrowing. L1-L2: No significant disc bulge. Mild facet arthropathy. No spinal canal stenosis or neural foraminal narrowing. L2-L3: Minimal disc bulge. Mild facet arthropathy. Ligamentum flavum hypertrophy. Mild spinal canal stenosis and mild bilateral neural foraminal narrowing, which have progressed from the prior exam. Narrowing of the lateral recesses. L3-L4: Mild retrolisthesis with disc osteophyte complex. Moderate facet arthropathy. Interval laminectomy. No residual spinal canal stenosis. Narrowing of the left lateral recess. Moderate left-greater-than-right neural foraminal narrowing, similar to prior L4-L5: Status post fusion. Grade 1 anterolisthesis with disc unroofing. Severe facet arthropathy. Moderate spinal canal stenosis, which has progressed from the prior exam. Narrowing of the lateral recesses. No neural foraminal narrowing. L5-S1: No significant disc bulge. Possible granulation tissue along the anterior aspect of the thecal sac versus extruded disc. Moderate to severe right and mild left facet arthropathy. Mild spinal canal stenosis. Narrowing of the lateral recesses. Moderate right neural foraminal narrowing, which has progressed from the prior exam IMPRESSION: 1. No evidence of discitis or osteomyelitis. No epidural collection. 2. L4-L5 moderate spinal canal stenosis, which has progressed from the prior exam. 3. L5-S1 mild spinal canal stenosis and moderate right neural foraminal narrowing, which has progressed from the prior exam. 4. L3-L4 moderate left-greater-than-right neural foraminal narrowing. 5. L2-L3 mild spinal canal stenosis and mild bilateral neural foraminal narrowing. 6. Narrowing of the lateral recesses at L2-L3, L3-L4, L4-L5, and L5-S1 could affect the descending L3, L4, L5, and S1 nerve roots, respectively. Electronically Signed   By: Merilyn Baba M.D.   On: 02/08/2023 02:26   CT Renal Stone Study  Result Date: 02/08/2023 CLINICAL DATA:  Right back and  flank pain.  Urolithiasis. EXAM: CT ABDOMEN AND PELVIS WITHOUT CONTRAST TECHNIQUE: Multidetector CT imaging of the abdomen and pelvis was performed following  the standard protocol without IV contrast. RADIATION DOSE REDUCTION: This exam was performed according to the departmental dose-optimization program which includes automated exposure control, adjustment of the mA and/or kV according to patient size and/or use of iterative reconstruction technique. COMPARISON:  05/03/2019 FINDINGS: Lower chest: No acute abnormality. Extensive coronary artery calcification Hepatobiliary: No focal liver abnormality is seen. No gallstones, gallbladder wall thickening, or biliary dilatation. Pancreas: Unremarkable Spleen: Unremarkable Adrenals/Urinary Tract: Right adrenal gland is unremarkable. 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 No follow-up imaging is recommended for this lesion. The kidneys are normal in size and position. Multiple nonobstructing calculi are seen within the kidneys bilaterally measuring up to 8 mm within the upper pole of the right kidney and 2 mm within the interpolar region of the left kidney. Stone burden within the right kidney has progressed slightly since prior examination. No hydronephrosis. No ureteral calculi. Moderate bilateral nonspecific perinephric stranding is present, progressive since prior examination. No perinephric fluid collections are seen. The bladder is decompressed and is unremarkable. Stomach/Bowel: Stomach is within normal limits. Appendix appears normal. No evidence of bowel wall thickening, distention, or inflammatory changes. Vascular/Lymphatic: Aortic atherosclerosis. No enlarged abdominal or pelvic lymph nodes. Reproductive: Prostate is unremarkable. Other: Tiny umbilical hernia contains mesenteric fat as well as the distal tip of the appendix. Musculoskeletal: No acute bone abnormality. L4-5 anterior lumbar fusion procedure with instrumentation has  been performed. No definite bridging callus identified. Advanced degenerative changes are seen at L3-4. No acute bone abnormality. No suspicious lytic or blastic bone lesion. Postsurgical changes are seen within the right iliac spine. IMPRESSION: 1. No acute intra-abdominal pathology identified. 2. Extensive coronary artery calcification. 3. 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 No follow-up imaging is recommended for this lesion. 4. Right greater than left nonobstructing nephrolithiasis. Stone burden within the right kidney has progressed slightly since prior examination. No urolithiasis. No hydronephrosis. 5. Progressive nonspecific bilateral perinephric stranding. Correlation with laboratory examination may be helpful to exclude an underlying infectious or inflammatory process. Aortic Atherosclerosis (ICD10-I70.0). Electronically Signed   By: Fidela Salisbury M.D.   On: 02/08/2023 01:11   CT Angio Chest PE W and/or Wo Contrast  Result Date: 02/08/2023 CLINICAL DATA:  Syncope. EXAM: CT ANGIOGRAPHY CHEST WITH CONTRAST TECHNIQUE: Multidetector CT imaging of the chest was performed using the standard protocol during bolus administration of intravenous contrast. Multiplanar CT image reconstructions and MIPs were obtained to evaluate the vascular anatomy. RADIATION DOSE REDUCTION: This exam was performed according to the departmental dose-optimization program which includes automated exposure control, adjustment of the mA and/or kV according to patient size and/or use of iterative reconstruction technique. CONTRAST:  38mL OMNIPAQUE IOHEXOL 350 MG/ML SOLN COMPARISON:  Chest radiograph dated 02/07/2023. FINDINGS: Evaluation of this exam is limited due to respiratory motion artifact. Cardiovascular: There is no cardiomegaly or pericardial effusion. Coronary vascular calcification of the LAD and left circumflex artery. Mild atherosclerotic calcification of the thoracic aorta. No aneurysmal dilatation. Evaluation of the  pulmonary arteries is limited due to respiratory motion and suboptimal visualization of the peripheral branches. No large central pulmonary artery embolus identified. Mediastinum/Nodes: No hilar or mediastinal adenopathy. The esophagus is grossly unremarkable. No mediastinal fluid collection. Lungs/Pleura: Bibasilar dependent atelectasis. No focal consolidation, pleural effusion, or pneumothorax. The central airways are patent. Upper Abdomen: Fatty liver.  A 2 cm left adrenal adenoma. Musculoskeletal: No acute osseous pathology. Review of the MIP images confirms the above findings. IMPRESSION: 1. No acute intrathoracic pathology. No large central pulmonary  artery embolus identified. 2. Coronary vascular calcification of the LAD and left circumflex artery. 3. Fatty liver. 4. A 2 cm left adrenal adenoma. 5.  Aortic Atherosclerosis (ICD10-I70.0). Electronically Signed   By: Anner Crete M.D.   On: 02/08/2023 01:02   CT HEAD WO CONTRAST (5MM)  Result Date: 02/08/2023 CLINICAL DATA:  Altered mental status EXAM: CT HEAD WITHOUT CONTRAST TECHNIQUE: Contiguous axial images were obtained from the base of the skull through the vertex without intravenous contrast. RADIATION DOSE REDUCTION: This exam was performed according to the departmental dose-optimization program which includes automated exposure control, adjustment of the mA and/or kV according to patient size and/or use of iterative reconstruction technique. COMPARISON:  01/05/2020 FINDINGS: Brain: Normal anatomic configuration. Parenchymal volume loss is commensurate with the patient's age. Mild periventricular white matter changes are present likely reflecting the sequela of small vessel ischemia. No abnormal intra or extra-axial mass lesion or fluid collection. No abnormal mass effect or midline shift. No evidence of acute intracranial hemorrhage or infarct. Ventricular size is normal. Cerebellum unremarkable. Vascular: No asymmetric hyperdense vasculature at  the skull base. Skull: Intact Sinuses/Orbits: Paranasal sinuses are clear. Orbits are unremarkable. Other: Mastoid air cells and middle ear cavities are clear. IMPRESSION: 1. No acute intracranial hemorrhage or infarct. 2. Mild senescent change. Electronically Signed   By: Fidela Salisbury M.D.   On: 02/08/2023 01:01   DG Chest Port 1 View  Result Date: 02/07/2023 CLINICAL DATA:  Opiate overdose, lethargy EXAM: PORTABLE CHEST 1 VIEW COMPARISON:  06/28/2019 FINDINGS: Single frontal view of the chest demonstrates an unremarkable cardiac silhouette. Continued ectasia of the thoracic aorta, likely not appreciably changed allowing for differences due to portable technique and AP positioning. Lung volumes are diminished, with patchy left basilar consolidation concerning for aspiration given clinical presentation. No effusion or pneumothorax. IMPRESSION: 1. Patchy left basilar consolidation, which could reflect hypoventilatory changes or aspiration given clinical presentation. 2. Stable thoracic aortic ectasia. Electronically Signed   By: Randa Ngo M.D.   On: 02/07/2023 23:54             LOS: 2 days       Emeterio Reeve, DO [facility] 02/10/2023, 2:50 PM    Dictation software may have been used to generate the above note. Typos may occur and escape review in typed/dictated notes. Please contact Dr Sheppard Coil directly for clarity if needed.  Staff may message me via secure chat in [place]  but this may not receive an immediate response,  please page me for urgent matters!  If 7PM-7AM, please contact night coverage [email or web address]

## 2023-02-10 NOTE — Progress Notes (Signed)
   [place] has been requested to perform a transesophageal echocardiogram on Brett Bender for bacteremia.  After careful review of history and examination, the risks and benefits of transesophageal echocardiogram have been explained including risks of esophageal damage, perforation (1:10,000 risk), bleeding, pharyngeal hematoma as well as other potential complications associated with conscious sedation including aspiration, arrhythmia, respiratory failure and death. Alternatives to treatment were discussed, questions were answered. Patient is willing to proceed.   Melisha Eggleton Ninfa Meeker, PA-C  02/10/2023 10:59 AM

## 2023-02-10 NOTE — Progress Notes (Addendum)
Date of Admission:  02/07/2023      ID: Brett Bender is a 77 y.o. male Principal Problem:   Sepsis Active Problems:   Essential (primary) hypertension   Lumbosacral neuritis   BPH (benign prostatic hyperplasia)   History of nephrolithiasis   Anxiety   AKI (acute kidney injury)   Overdose opiate, accidental or unintentional, initial encounter   Right flank pain   Acute metabolic encephalopathy   MSSA bacteremia    Subjective: Pt is doing better Urinary retention- had foley 675 ml  Medications:   acidophilus  1 capsule Oral Daily   aspirin  81 mg Oral Daily   Chlorhexidine Gluconate Cloth  6 each Topical Daily   enoxaparin (LOVENOX) injection  40 mg Subcutaneous Q24H   finasteride  5 mg Oral Daily   latanoprost  1 drop Both Eyes QHS   loratadine  10 mg Oral Daily   mirtazapine  30 mg Oral QHS   multivitamin with minerals  1 tablet Oral Daily   polyethylene glycol  17 g Oral BID   tamsulosin  0.4 mg Oral Daily   timolol  1 drop Both Eyes Daily    Objective: Vital signs in last 24 hours: Patient Vitals for the past 24 hrs:  BP Temp Temp src Pulse Resp SpO2  02/10/23 1112 127/77 98.3 F (36.8 C) Oral (!) 58 18 93 %  02/10/23 0832 134/78 98.4 F (36.9 C) -- 61 18 96 %  02/10/23 0358 112/68 98.3 F (36.8 C) -- (!) 54 20 93 %  02/09/23 2332 117/67 98.2 F (36.8 C) Oral (!) 50 20 94 %  02/09/23 2048 124/66 98.4 F (36.9 C) Oral 62 20 92 %  02/09/23 1558 (!) 117/39 98.5 F (36.9 C) -- 64 16 94 %  02/09/23 1536 132/75 99.3 F (37.4 C) Oral 79 20 91 %      PHYSICAL EXAM:  General: Alert, cooperative,has oxygen by nasal cannula Few basal crepts Heart: s1s2 Abdomen: Soft, non-tender,not distended. Bowel sounds normal. No masses Extremities: atraumatic, no cyanosis. No edema. No clubbing Skin: wound below the nape of neck healing Excoriations  Lymph: Cervical, supraclavicular normal. Neurologic: Grossly non-focal Foley catheter  Lab Results    Latest Ref  Rng & Units 02/09/2023    5:33 AM 02/08/2023    6:38 AM 02/07/2023   11:35 PM  CBC  WBC 4.0 - 10.5 K/uL 16.4  20.3  22.5   Hemoglobin 13.0 - 17.0 g/dL 13.6  13.2  14.2   Hematocrit 39.0 - 52.0 % 40.8  41.3  43.5   Platelets 150 - 400 K/uL 126  141  156        Latest Ref Rng & Units 02/10/2023    5:06 AM 02/09/2023    5:33 AM 02/08/2023    6:38 AM  CMP  Glucose 70 - 99 mg/dL 120  146    BUN 8 - 23 mg/dL 43  57    Creatinine 0.61 - 1.24 mg/dL 1.28  1.56  1.72   Sodium 135 - 145 mmol/L 137  135    Potassium 3.5 - 5.1 mmol/L 3.2  3.4    Chloride 98 - 111 mmol/L 103  101    CO2 22 - 32 mmol/L 24  24    Calcium 8.9 - 10.3 mg/dL 8.2  8.5    Total Protein 6.5 - 8.1 g/dL  6.1    Total Bilirubin 0.3 - 1.2 mg/dL  1.1    Alkaline Phos  38 - 126 U/L  73    AST 15 - 41 U/L  27    ALT 0 - 44 U/L  29        Microbiology: [facility] MSSA WC MSSA Studies/Results: ECHOCARDIOGRAM COMPLETE  Result Date: 02/09/2023    ECHOCARDIOGRAM REPORT   Patient Name:   GERAMIAH RENNAKER Date of Exam: 02/09/2023 Medical Rec #:  [id number]      Height:       76.0 in Accession #:    [id number]     Weight:       205.9 lb Date of Birth:  24-Apr-1946     BSA:          2.243 m Patient Age:    77 years       BP:           121/69 mmHg Patient Gender: M              HR:           65 bpm. Exam Location:  [facility] Procedure: 2D Echo, Cardiac Doppler and Color Doppler Indications:     Bacteremia  History:         Patient has no prior history of Echocardiogram examinations.                  Signs/Symptoms:Bacteremia; Risk Factors:Hypertension.  Sonographer:     Wenda Low Referring Phys:  [id number] Rise Mu Diagnosing Phys: Kate Sable MD  Sonographer Comments: Image acquisition challenging due to respiratory motion. IMPRESSIONS  1. Left ventricular ejection fraction, by estimation, is 60 to 65%. The left ventricle has normal function. The left ventricle has no regional wall motion abnormalities. Left ventricular diastolic parameters are  consistent with Grade I diastolic dysfunction (impaired relaxation).  2. Right ventricular systolic function is normal. The right ventricular size is normal. There is moderately elevated pulmonary artery systolic pressure.  3. The mitral valve is normal in structure. Trivial mitral valve regurgitation.  4. The aortic valve was not well visualized. Aortic valve regurgitation is trivial.  5. Aortic dilatation noted. There is mild dilatation of the aortic root, measuring 40 mm. FINDINGS  Left Ventricle: Left ventricular ejection fraction, by estimation, is 60 to 65%. The left ventricle has normal function. The left ventricle has no regional wall motion abnormalities. The left ventricular internal cavity size was normal in size. There is  no left ventricular hypertrophy. Left ventricular diastolic parameters are consistent with Grade I diastolic dysfunction (impaired relaxation). Right Ventricle: The right ventricular size is normal. No increase in right ventricular wall thickness. Right ventricular systolic function is normal. There is moderately elevated pulmonary artery systolic pressure. The tricuspid regurgitant velocity is 3.29 m/s, and with an assumed right atrial pressure of 8 mmHg, the estimated right ventricular systolic pressure is [id number] mmHg. Left Atrium: Left atrial size was normal in size. Right Atrium: Right atrial size was normal in size. Pericardium: There is no evidence of pericardial effusion. Mitral Valve: The mitral valve is normal in structure. Trivial mitral valve regurgitation. MV peak gradient, 4.6 mmHg. The mean mitral valve gradient is 1.0 mmHg. Tricuspid Valve: The tricuspid valve is normal in structure. Tricuspid valve regurgitation is mild. Aortic Valve: The aortic valve was not well visualized. Aortic valve regurgitation is trivial. Aortic valve mean gradient measures 7.0 mmHg. Aortic valve peak gradient measures 13.1 mmHg. Aortic valve area, by VTI measures 2.52 cm. Pulmonic Valve: The  pulmonic valve was not well visualized. Pulmonic valve regurgitation is  not visualized. Aorta: Aortic dilatation noted. There is mild dilatation of the aortic root, measuring 40 mm. Venous: The inferior vena cava was not well visualized. IAS/Shunts: No atrial level shunt detected by color flow Doppler.  LEFT VENTRICLE PLAX 2D LVIDd:         5.10 cm   Diastology LVIDs:         3.20 cm   LV e' medial:    7.29 cm/s LV PW:         1.10 cm   LV E/e' medial:  9.4 LV IVS:        1.20 cm   LV e' lateral:   11.50 cm/s LVOT diam:     1.90 cm   LV E/e' lateral: 6.0 LV SV:         99 LV SV Index:   44 LVOT Area:     2.84 cm  RIGHT VENTRICLE RV Basal diam:  3.75 cm RV Mid diam:    3.00 cm RV S prime:     16.80 cm/s TAPSE (M-mode): 2.9 cm LEFT ATRIUM             Index        RIGHT ATRIUM           Index LA diam:        4.50 cm 2.01 cm/m   RA Area:     20.20 cm LA Vol (A2C):   84.6 ml 37.72 ml/m  RA Volume:   57.00 ml  25.41 ml/m LA Vol (A4C):   53.5 ml 23.85 ml/m LA Biplane Vol: 71.3 ml 31.79 ml/m  AORTIC VALVE                     PULMONIC VALVE AV Area (Vmax):    2.48 cm      PV Vmax:       1.03 m/s AV Area (Vmean):   2.25 cm      PV Peak grad:  4.2 mmHg AV Area (VTI):     2.52 cm AV Vmax:           181.00 cm/s AV Vmean:          125.000 cm/s AV VTI:            0.392 m AV Peak Grad:      13.1 mmHg AV Mean Grad:      7.0 mmHg LVOT Vmax:         158.00 cm/s LVOT Vmean:        99.100 cm/s LVOT VTI:          0.348 m LVOT/AV VTI ratio: 0.89  AORTA Ao Root diam: 4.00 cm Ao Asc diam:  3.60 cm MITRAL VALVE               TRICUSPID VALVE MV Area (PHT): 3.16 cm    TR Peak grad:   43.3 mmHg MV Area VTI:   3.08 cm    TR Vmax:        329.00 cm/s MV Peak grad:  4.6 mmHg MV Mean grad:  1.0 mmHg    SHUNTS MV Vmax:       1.07 m/s    Systemic VTI:  0.35 m MV Vmean:      52.1 cm/s   Systemic Diam: 1.90 cm MV Decel Time: 240 msec MV E velocity: 68.70 cm/s MV A velocity: 99.00 cm/s MV E/A ratio:  0.69 Kate Sable MD Electronically  signed by Kate Sable MD  Signature Date/Time: 02/09/2023/1:38:38 PM    Final      Assessment/Plan:  Altered mental status is resolved now was likely a combination of pain meds and muscle relaxant   Staph aureus bacteremia.  Skin wound positive for MSSA 2D echo done TEE tomorrow MRI of the lumbar spine is negative for discitis or osteomyelitis  Continue cefazolin Will need for minimum 4 weeks   AKI could be due to infection and urinary retention   Leukocytosis- improving   History of lumbar spine fusion   B/l renal stones  nonobstructive.    BPH on tamsulosin and finasteride. Urinary retention- now has foley   Discussed the management with the patient

## 2023-02-10 NOTE — Consult Note (Signed)
   [facility] CM Inpatient Consult   02/10/2023  ATIF CHAPPLE 12-05-1945 [id number]  Orientation with Charlesetta Shanks, RN [facility] Liaison for review.   Location: [facility] RN Hospital Liaison screened remotely [facility]).   [facility] Customer service manager [facility]) Accountable Care Organization [ACO] Patient: Insurance [facility])    Primary Care Provider:Fisher, Demetrios Isaacs, MD with [facility]   Patient screened for readmission hospitalization with noted low risk score for unplanned readmission risk  with 1 IP in 6 months.  [facility] RN liaison will assess for potential [facility]) Care Management service needs for post hospital transition for care coordination. Liaison attempted outreach call however unsuccessful.   Addendum 02/14/2023 0830[facility] Liaison spoke with pt's spouse and daughter concerning [facility] and pt's recent discharged. Educated on [facility] services and offered a follow up call (receptive). Spouse indicates pt continue to have back pain. [facility] liaison requested spouse and daughter to contact the provider's office (Dr. Sherrie Mustache) concerning pain management related to pt's ongoing issues. [facility] liaison informed caregiver if severe and unable to obtain further intervention for pt to seek medical attention.   Plan:  [facility] RN will continue to follow ongoing disposition to assess for post hospital community care coordination/management needs.  Referral request for community care coordination: admitting diagnosis.   [facility] Care Management/Population Health does not replace or interfere with any arrangements made by the Inpatient Transition of Care team.   For questions contact:    Elliot Cousin, RN, BSN [facility] Liaison [place]   [facility]  Population Health Office Hours M-F 8:00 am to 5 pm [phone] mobile [phone] [Office toll free line][facility] Office Hours are M-F 8:30 - 5 pm 24 hour nurse advise line [phone] Conceirge   [email or web address]

## 2023-02-10 NOTE — TOC Initial Note (Signed)
Transition of Care [facility]) - Initial/Assessment Note    Patient Details  Name: Brett Bender MRN: [id number] Date of Birth: 07-18-1946  Transition of Care [facility]) CM/SW Contact:    Laurena Slimmer, RN Phone Number: 02/10/2023, 2:24 PM  Clinical Narrative:                 Case reviewed for DME needs and changes in discharge disposition.         Patient Goals and CMS Choice            Expected Discharge Plan and Services                                              Prior Living Arrangements/Services                       Activities of Daily Living      Permission Sought/Granted                  Emotional Assessment              Admission diagnosis:  Accidental overdose, initial encounter [T50.901A] Sepsis [A41.9] Sepsis without acute organ dysfunction, due to unspecified organism [A41.9] Patient Active Problem List   Diagnosis Date Noted   MSSA bacteremia 02/09/2023   Sepsis 02/08/2023   Overdose opiate, accidental or unintentional, initial encounter 02/08/2023   Right flank pain [id number]   Acute metabolic encephalopathy [id number]   Clostridium difficile colitis 01/04/2020   Diarrhea 01/03/2020   AKI (acute kidney injury) 01/03/2020   Hyperbilirubinemia 01/03/2020   Spleen enlarged 07/02/2019   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   Depression 03/16/2016   Anxiety 02/11/2016   Nocturia 02/11/2016   Kidney stones 01/19/2016   Left ureteral stone 12/08/2015   Microscopic hematuria 12/08/2015   Hydronephrosis of left kidney 12/08/2015   Renal stone 12/08/2015   History of nephrolithiasis 11/24/2015   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 [place] [address] Phone: [phone] Fax: [phone]     Social Determinants of Health (SDOH) Social History: SDOH Screenings   Food Insecurity: No Food Insecurity (01/04/2023)  Housing: Low Risk  (01/04/2023)  Transportation Needs: No Transportation Needs (01/04/2023)  Utilities: Not At Risk (01/04/2023)  Alcohol Screen: Low Risk  (01/04/2023)  Depression (PHQ2-9): Low Risk  (01/04/2023)  Financial Resource Strain: Low Risk  (01/04/2023)  Physical Activity: Sufficiently Active (01/04/2023)  Social Connections: Moderately Integrated (01/04/2023)  Stress: No Stress Concern Present (01/04/2023)  Tobacco Use: Low Risk  (01/04/2023)   SDOH Interventions:     Readmission Risk Interventions  No data to display

## 2023-02-11 ENCOUNTER — Encounter: Payer: Self-pay | Admitting: Internal Medicine

## 2023-02-11 ENCOUNTER — Encounter
Admission: EM | Disposition: A | Discharge status: Home Health | Payer: Self-pay | Source: Home / Self Care | Attending: Osteopathic Medicine

## 2023-02-11 ENCOUNTER — Other Ambulatory Visit: Payer: Self-pay

## 2023-02-11 ENCOUNTER — Inpatient Hospital Stay
Admit: 2023-02-11 | Discharge: 2023-02-11 | Disposition: A | Discharge status: Home / Self Care | Payer: Medicare Other | Attending: Medical | Admitting: Medical

## 2023-02-11 DIAGNOSIS — N179 Acute kidney failure, unspecified: Secondary | ICD-10-CM | POA: Diagnosis not present

## 2023-02-11 DIAGNOSIS — N401 Enlarged prostate with lower urinary tract symptoms: Secondary | ICD-10-CM | POA: Diagnosis not present

## 2023-02-11 DIAGNOSIS — B9561 Methicillin susceptible Staphylococcus aureus infection as the cause of diseases classified elsewhere: Secondary | ICD-10-CM | POA: Diagnosis not present

## 2023-02-11 DIAGNOSIS — R7881 Bacteremia: Secondary | ICD-10-CM | POA: Diagnosis not present

## 2023-02-11 DIAGNOSIS — G9341 Metabolic encephalopathy: Secondary | ICD-10-CM | POA: Diagnosis not present

## 2023-02-11 HISTORY — PX: TEE WITHOUT CARDIOVERSION: SHX5443

## 2023-02-11 LAB — BASIC METABOLIC PANEL
Anion gap: 7 (ref 5–15)
BUN: 27 mg/dL — ABNORMAL HIGH (ref 8–23)
CO2: 26 mmol/L (ref 22–32)
Calcium: 8.4 mg/dL — ABNORMAL LOW (ref 8.9–10.3)
Chloride: 107 mmol/L (ref 98–111)
Creatinine, Ser: 0.96 mg/dL (ref 0.61–1.24)
GFR, Estimated: >60 mL/min (ref >60)
Glucose, Bld: 133 mg/dL — ABNORMAL HIGH (ref 70–99)
Potassium: 3.6 mmol/L (ref 3.5–5.1)
Sodium: 140 mmol/L (ref 135–145)

## 2023-02-11 LAB — AEROBIC CULTURE W GRAM STAIN (SUPERFICIAL SPECIMEN): Gram Stain: NONE SEEN

## 2023-02-11 SURGERY — ECHOCARDIOGRAM, TRANSESOPHAGEAL
Anesthesia: Moderate Sedation

## 2023-02-11 MED ORDER — MIDAZOLAM HCL 2 MG/2ML IJ SOLN
INTRAMUSCULAR | Status: AC | PRN
  Administered 2023-02-11 (×2): 1 mg via INTRAVENOUS

## 2023-02-11 MED ORDER — FENTANYL CITRATE (PF) 100 MCG/2ML IJ SOLN
INTRAMUSCULAR | Status: AC | PRN
  Administered 2023-02-11 (×2): 25 ug via INTRAVENOUS

## 2023-02-11 MED ORDER — LIDOCAINE VISCOUS HCL 2 % MT SOLN
OROMUCOSAL | Status: AC
  Filled 2023-02-11: qty 15

## 2023-02-11 MED ORDER — FENTANYL CITRATE (PF) 100 MCG/2ML IJ SOLN
INTRAMUSCULAR | Status: AC
  Filled 2023-02-11: qty 2

## 2023-02-11 MED ORDER — SODIUM CHLORIDE 0.9% FLUSH: 10.0000 mL | INTRAVENOUS | Status: DC | PRN

## 2023-02-11 MED ORDER — SODIUM CHLORIDE 0.9% FLUSH
10.0000 mL | Freq: Two times a day (BID) | INTRAVENOUS | Status: DC
  Administered 2023-02-11 – 2023-02-13 (×4): 10 mL

## 2023-02-11 MED ORDER — SODIUM CHLORIDE 0.9 % IV SOLN: INTRAVENOUS | Status: DC

## 2023-02-11 MED ORDER — MIDAZOLAM HCL 2 MG/2ML IJ SOLN
INTRAMUSCULAR | Status: AC
  Filled 2023-02-11: qty 4

## 2023-02-11 NOTE — Progress Notes (Signed)
PHARMACY CONSULT NOTE FOR:  OUTPATIENT  PARENTERAL ANTIBIOTIC THERAPY (OPAT)  Indication: MSSA bacteremia Regimen: cefazolin 6gm daily over 24h as continuous infusion End date: 03/08/2023  Please pull PIC at completion of IV antibiotics Fax weekly lab results  promptly to [phone]  IV antibiotic discharge orders are pended. To discharging provider:  please sign these orders via discharge navigator,  Select New Orders & click on the button choice - Manage This Unsigned Work.     Thank you for allowing pharmacy to be a part of this patient's care.  Juliette Alcide, PharmD, BCPS, BCIDP Work Cell: [phone] 02/11/2023 4:13 PM

## 2023-02-11 NOTE — Evaluation (Signed)
Physical Therapy Evaluation Patient Details Name: Brett Bender MRN: [id number] DOB: 08/24/1946 Today's Date: 02/11/2023  History of Present Illness  Pt is a 77 y.o. male with a history of renal stones, BCC, BPH, DDD presents with latered mental status and concern for accidental over dose of pain meds and muscle relaxants.   Clinical Impression  Patient alert and agreeable to PT, oriented x4. Complaining of 10/10 low back pain, RN notified of pt request for voltaren gel. Per pt and family at baseline he is independent and lives with his spouse. He was able to perform bed mobility with supervision, and sit <> Stand with RW and supervision as well. Cued for hand placement and RW positioning to maximize safety. He was able to ambulate ~124ft with CGA-supervision, and improved gait quality and velocity noted with time. Returned to room with needs in reach sitting EOB waiting for his RN, RN aware.  Overall the patient demonstrated deficits (see "PT Problem List") that impede the patient's functional abilities, safety, and mobility and would benefit from skilled PT intervention. Recommendation at this time is continued PT services to maximize safety, function and return to PLOF.        Recommendations for follow up therapy are one component of a multi-disciplinary discharge planning process, led by the attending physician.  Recommendations may be updated based on patient status, additional functional criteria and insurance authorization.  Follow Up Recommendations       Assistance Recommended at Discharge Intermittent Supervision/Assistance  Patient can return home with the following  A little help with walking and/or transfers;Assistance with cooking/housework;Assist for transportation;Direct supervision/assist for medications management;Help with stairs or ramp for entrance    Equipment Recommendations None recommended by PT  Recommendations for Other Services       Functional Status  Assessment Patient has had a recent decline in their functional status and demonstrates the ability to make significant improvements in function in a reasonable and predictable amount of time.     Precautions / Restrictions Precautions Precautions: Fall Restrictions Weight Bearing Restrictions: No      Mobility  Bed Mobility Overal bed mobility: Needs Assistance Bed Mobility: Supine to Sit     Supine to sit: Supervision, HOB elevated     General bed mobility comments: painful for pt but no physical assistance    Transfers Overall transfer level: Needs assistance Equipment used: Rolling walker (2 wheels) Transfers: Sit to/from Stand Sit to Stand: Supervision           General transfer comment: cued for hand placement, effortful due to pain but no physical assistance    Ambulation/Gait Ambulation/Gait assistance: Min guard, Supervision Gait Distance (Feet): 180 Feet Assistive device: Rolling walker (2 wheels)         General Gait Details: improved cadence, confidence, velocity, and step length with time  Stairs            Wheelchair Mobility    Modified Rankin (Stroke Patients Only)       Balance Overall balance assessment: Needs assistance Sitting-balance support: Feet supported Sitting balance-Leahy Scale: Good       Standing balance-Leahy Scale: Good Standing balance comment: improved safety with BUE support                             Pertinent Vitals/Pain Pain Assessment Pain Assessment: 0-10 Pain Score: 10-Worst pain ever Pain Location: low back pain Pain Descriptors / Indicators: Aching, Grimacing, Sore, Moaning Pain Intervention(s):  Limited activity within patient's tolerance, Repositioned, Monitored during session, Patient requesting pain meds-RN notified    Home Living Family/patient expects to be discharged to:: Private residence Living Arrangements: Spouse/significant other Available Help at Discharge:  Family;Available 24 hours/day Type of Home: House Home Access: Stairs to enter Entrance Stairs-Rails: Left Entrance Stairs-Number of Steps: 8   Home Layout: One level Home Equipment: Rolling Walker (2 wheels);Grab bars - tub/shower;Toilet riser;Cane - single point      Prior Function Prior Level of Function : Independent/Modified Independent                     Hand Dominance        Extremity/Trunk Assessment   Upper Extremity Assessment Upper Extremity Assessment: Overall WFL for tasks assessed    Lower Extremity Assessment Lower Extremity Assessment: Overall WFL for tasks assessed    Cervical / Trunk Assessment Cervical / Trunk Assessment: Normal  Communication   Communication: No difficulties  Cognition Arousal/Alertness: Awake/alert Behavior During Therapy: WFL for tasks assessed/performed Overall Cognitive Status: Within Functional Limits for tasks assessed                                          General Comments      Exercises     Assessment/Plan    PT Assessment Patient needs continued PT services  PT Problem List Decreased mobility;Decreased strength;Decreased balance;Pain;Decreased activity tolerance       PT Treatment Interventions DME instruction;Therapeutic activities;Gait training;Therapeutic exercise;Balance training;Stair training;Functional mobility training;Neuromuscular re-education;Patient/family education    PT Goals (Current goals can be found in the Care Plan section)  Acute Rehab PT Goals Patient Stated Goal: to have less back pain PT Goal Formulation: With patient Time For Goal Achievement: 02/25/23 Potential to Achieve Goals: Good    Frequency Min 2X/week     Co-evaluation               AM-PAC PT "6 Clicks" Mobility  Outcome Measure Help needed turning from your back to your side while in a flat bed without using bedrails?: None Help needed moving from lying on your back to sitting on the side  of a flat bed without using bedrails?: None Help needed moving to and from a bed to a chair (including a wheelchair)?: None Help needed standing up from a chair using your arms (e.g., wheelchair or bedside chair)?: None Help needed to walk in hospital room?: None Help needed climbing 3-5 steps with a railing? : A Little 6 Click Score: 23    End of Session Equipment Utilized During Treatment: Gait belt Activity Tolerance: Patient tolerated treatment well Patient left: in bed;Other (comment);with call bell/phone within reach (sitting EOB with RN consent) Nurse Communication: Mobility status PT Visit Diagnosis: Other abnormalities of gait and mobility (R26.89);Pain Pain - Right/Left:  (midline) Pain - part of body:  (lumbar spine)    Time: [id number] PT Time Calculation (min) (ACUTE ONLY): 22 min   Charges:   PT Evaluation $PT Eval Low Complexity: 1 Low PT Treatments $Therapeutic Activity: 8-22 mins        Olga Coasteriana Finlee Concepcion PT, DPT 3:40 PM,02/11/23

## 2023-02-11 NOTE — Care Management Important Message (Signed)
Important Message  Patient Details  Name: Brett Bender MRN: [id number] Date of Birth: [birth year]   Medicare Important Message Given:  Yes     Johnell Comings 02/11/2023, 12:48 PM

## 2023-02-11 NOTE — Progress Notes (Signed)
Date of Admission:  02/07/2023      ID: Brett Bender is a 77 y.o. male Principal Problem:   Sepsis Active Problems:   Essential (primary) hypertension   Lumbosacral neuritis   BPH (benign prostatic hyperplasia)   History of nephrolithiasis   Anxiety   AKI (acute kidney injury)   Overdose opiate, accidental or unintentional, initial encounter   Right flank pain   Acute metabolic encephalopathy   MSSA bacteremia    Subjective: Pt tried to get TEE, but it could not be done as he desaturated He is doing okay now In bed without oxygen   Medications:   acidophilus  1 capsule Oral Daily   aspirin  81 mg Oral Daily   Chlorhexidine Gluconate Cloth  6 each Topical Daily   enoxaparin (LOVENOX) injection  40 mg Subcutaneous Q24H   fentaNYL       finasteride  5 mg Oral Daily   latanoprost  1 drop Both Eyes QHS   loratadine  10 mg Oral Daily   midazolam       mirtazapine  30 mg Oral QHS   multivitamin with minerals  1 tablet Oral Daily   tamsulosin  0.4 mg Oral Daily   timolol  1 drop Both Eyes Daily    Objective: Vital signs in last 24 hours: Patient Vitals for the past 24 hrs:  BP Temp Temp src Pulse Resp SpO2 Height Weight  02/11/23 1005 131/83 98.1 F (36.7 C) -- 63 16 93 % -- --  02/11/23 0900 131/75 -- -- 69 20 91 % -- --  02/11/23 0845 116/75 -- -- 67 (!) 23 92 % -- --  02/11/23 0830 113/80 -- -- 66 (!) 21 91 % -- --  02/11/23 0823 131/67 -- -- (!) 59 (!) 27 90 % -- --  02/11/23 0818 (!) 112/56 -- -- 86 (!) 21 93 % -- --  02/11/23 0817 -- -- -- 79 (!) 26 97 % -- --  02/11/23 0815 -- -- -- 63 20 98 % -- --  02/11/23 0811 (!) 150/89 -- -- (!) 58 (!) 22 94 % -- --  02/11/23 0807 (!) 138/105 -- -- 71 (!) 23 91 % -- --  02/11/23 0743 138/85 -- -- -- -- -- -- --  02/11/23 0739 -- (!) 97.5 F (36.4 C) Oral 74 -- 90 % 6\' 4"  (1.93 m) 93.4 kg  02/11/23 0458 -- -- -- -- -- 92 % -- --  02/11/23 0456 128/66 97.9 F (36.6 C) Oral 65 18 (!) 86 % -- --  02/11/23 0024 (!)  146/86 97.8 F (36.6 C) -- 69 20 90 % -- --  02/11/23 0019 (!) 149/79 98.1 F (36.7 C) -- 72 18 90 % -- --  02/10/23 2051 (!) 151/75 98.4 F (36.9 C) -- 67 20 93 % -- --  02/10/23 1922 137/79 98.3 F (36.8 C) Oral 67 20 -- -- --  02/10/23 1605 (!) 146/83 98.2 F (36.8 C) -- 70 18 97 % -- --  02/10/23 1112 127/77 98.3 F (36.8 C) Oral (!) 58 18 93 % -- --      PHYSICAL EXAM:  General: Alert, cooperative,no distress B/l air entry Heart: s1s2 Abdomen: Soft, non-tender,not distended. Bowel sounds normal. No masses Extremities: atraumatic, no cyanosis. No edema. No clubbing Skin: wound below the nape of neck healing Excoriations  Lymph: Cervical, supraclavicular normal. Neurologic: Grossly non-focal Foley catheter  Lab Results    Latest Ref Rng & Units 02/09/2023  5:33 AM 02/08/2023    6:38 AM 02/07/2023   11:35 PM  CBC  WBC 4.0 - 10.5 K/uL 16.4  20.3  22.5   Hemoglobin 13.0 - 17.0 g/dL 78.2  95.6  21.3   Hematocrit 39.0 - 52.0 % 40.8  41.3  43.5   Platelets 150 - 400 K/uL 126  141  156        Latest Ref Rng & Units 02/11/2023    5:09 AM 02/10/2023    5:06 AM 02/09/2023    5:33 AM  CMP  Glucose 70 - 99 mg/dL 086  578  469   BUN 8 - 23 mg/dL 27  43  57   Creatinine 0.61 - 1.24 mg/dL 6.29  5.28  4.13   Sodium 135 - 145 mmol/L 140  137  135   Potassium 3.5 - 5.1 mmol/L 3.6  3.2  3.4   Chloride 98 - 111 mmol/L 107  103  101   CO2 22 - 32 mmol/L 26  24  24    Calcium 8.9 - 10.3 mg/dL 8.4  8.2  8.5   Total Protein 6.5 - 8.1 g/dL   6.1   Total Bilirubin 0.3 - 1.2 mg/dL   1.1   Alkaline Phos 38 - 126 U/L   73   AST 15 - 41 U/L   27   ALT 0 - 44 U/L   29       Microbiology: 02/07/23 BC MSSA WC MSSA Repeat BC 02/09/23 NG Studies/Results: ECHOCARDIOGRAM COMPLETE  Result Date: 02/09/2023    ECHOCARDIOGRAM REPORT   Patient Name:   Brett Bender Date of Exam: 02/09/2023 Medical Rec #:  [id number]      Height:       76.0 in Accession #:    [id number]     Weight:       205.9 lb Date of  Birth:  Apr 27, 1946     BSA:          2.243 m Patient Age:    76 years       BP:           121/69 mmHg Patient Gender: M              HR:           65 bpm. Exam Location:  [facility] Procedure: 2D Echo, Cardiac Doppler and Color Doppler Indications:     Bacteremia  History:         Patient has no prior history of Echocardiogram examinations.                  Signs/Symptoms:Bacteremia; Risk Factors:Hypertension.  Sonographer:     Mikki Harbor Referring Phys:  [id number] Sondra Barges Diagnosing Phys: Debbe Odea MD  Sonographer Comments: Image acquisition challenging due to respiratory motion. IMPRESSIONS  1. Left ventricular ejection fraction, by estimation, is 60 to 65%. The left ventricle has normal function. The left ventricle has no regional wall motion abnormalities. Left ventricular diastolic parameters are consistent with Grade I diastolic dysfunction (impaired relaxation).  2. Right ventricular systolic function is normal. The right ventricular size is normal. There is moderately elevated pulmonary artery systolic pressure.  3. The mitral valve is normal in structure. Trivial mitral valve regurgitation.  4. The aortic valve was not well visualized. Aortic valve regurgitation is trivial.  5. Aortic dilatation noted. There is mild dilatation of the aortic root, measuring 40 mm. FINDINGS  Left Ventricle: Left ventricular ejection fraction, by estimation,  is 60 to 65%. The left ventricle has normal function. The left ventricle has no regional wall motion abnormalities. The left ventricular internal cavity size was normal in size. There is  no left ventricular hypertrophy. Left ventricular diastolic parameters are consistent with Grade I diastolic dysfunction (impaired relaxation). Right Ventricle: The right ventricular size is normal. No increase in right ventricular wall thickness. Right ventricular systolic function is normal. There is moderately elevated pulmonary artery systolic pressure. The tricuspid  regurgitant velocity is 3.29 m/s, and with an assumed right atrial pressure of 8 mmHg, the estimated right ventricular systolic pressure is 51.3 mmHg. Left Atrium: Left atrial size was normal in size. Right Atrium: Right atrial size was normal in size. Pericardium: There is no evidence of pericardial effusion. Mitral Valve: The mitral valve is normal in structure. Trivial mitral valve regurgitation. MV peak gradient, 4.6 mmHg. The mean mitral valve gradient is 1.0 mmHg. Tricuspid Valve: The tricuspid valve is normal in structure. Tricuspid valve regurgitation is mild. Aortic Valve: The aortic valve was not well visualized. Aortic valve regurgitation is trivial. Aortic valve mean gradient measures 7.0 mmHg. Aortic valve peak gradient measures 13.1 mmHg. Aortic valve area, by VTI measures 2.52 cm. Pulmonic Valve: The pulmonic valve was not well visualized. Pulmonic valve regurgitation is not visualized. Aorta: Aortic dilatation noted. There is mild dilatation of the aortic root, measuring 40 mm. Venous: The inferior vena cava was not well visualized. IAS/Shunts: No atrial level shunt detected by color flow Doppler.  LEFT VENTRICLE PLAX 2D LVIDd:         5.10 cm   Diastology LVIDs:         3.20 cm   LV e' medial:    7.29 cm/s LV PW:         1.10 cm   LV E/e' medial:  9.4 LV IVS:        1.20 cm   LV e' lateral:   11.50 cm/s LVOT diam:     1.90 cm   LV E/e' lateral: 6.0 LV SV:         99 LV SV Index:   44 LVOT Area:     2.84 cm  RIGHT VENTRICLE RV Basal diam:  3.75 cm RV Mid diam:    3.00 cm RV S prime:     16.80 cm/s TAPSE (M-mode): 2.9 cm LEFT ATRIUM             Index        RIGHT ATRIUM           Index LA diam:        4.50 cm 2.01 cm/m   RA Area:     20.20 cm LA Vol (A2C):   84.6 ml 37.72 ml/m  RA Volume:   57.00 ml  25.41 ml/m LA Vol (A4C):   53.5 ml 23.85 ml/m LA Biplane Vol: 71.3 ml 31.79 ml/m  AORTIC VALVE                     PULMONIC VALVE AV Area (Vmax):    2.48 cm      PV Vmax:       1.03 m/s AV Area  (Vmean):   2.25 cm      PV Peak grad:  4.2 mmHg AV Area (VTI):     2.52 cm AV Vmax:           181.00 cm/s AV Vmean:          125.000 cm/s  AV VTI:            0.392 m AV Peak Grad:      13.1 mmHg AV Mean Grad:      7.0 mmHg LVOT Vmax:         158.00 cm/s LVOT Vmean:        99.100 cm/s LVOT VTI:          0.348 m LVOT/AV VTI ratio: 0.89  AORTA Ao Root diam: 4.00 cm Ao Asc diam:  3.60 cm MITRAL VALVE               TRICUSPID VALVE MV Area (PHT): 3.16 cm    TR Peak grad:   43.3 mmHg MV Area VTI:   3.08 cm    TR Vmax:        329.00 cm/s MV Peak grad:  4.6 mmHg MV Mean grad:  1.0 mmHg    SHUNTS MV Vmax:       1.07 m/s    Systemic VTI:  0.35 m MV Vmean:      52.1 cm/s   Systemic Diam: 1.90 cm MV Decel Time: 240 msec MV E velocity: 68.70 cm/s MV A velocity: 99.00 cm/s MV E/A ratio:  0.69 Debbe OdeaBrian Agbor-Etang MD Electronically signed by Debbe OdeaBrian Agbor-Etang MD Signature Date/Time: 02/09/2023/1:38:38 PM    Final      Assessment/Plan:  Altered mental status is resolved now was likely a combination of pain meds and muscle relaxant   Staph aureus bacteremia.  Skin wound positive for MSSA- likely source 2D echo no vegetation TEE could not be done due to desaturation Risk of TEE under GA outweighs benefit  With skin wound, positive MSSA, no prosthetic valves, or PPM, and cultures drawn likely from the same site indicative of falsely high bioburden, repeat BC neg  - endocarditis low risk MRI of the lumbar spine is negative for discitis or osteomyelitis  Continue cefazolin for minimum of 4 weeks    AKI likely due to  urinary retention Has resolved    BPH on tamsulosin and finasteride. Urinary retention- now has foley  Leukocytosis- improving Will check tomorrow   History of lumbar spine fusion   B/l renal stones  nonobstructive.    Discussed the management with the patient and hospitalist OPAT orders placed for home antibiotics

## 2023-02-11 NOTE — TOC Progression Note (Addendum)
Transition of Care [facility]) - Progression Note    Patient Details  Name: Brett Bender MRN: [id number] Date of Birth: 1946-11-02  [facility] CM/SW Contact  Truddie Hidden, RN Phone Number: 02/11/2023, 1:14 PM  Clinical Narrative:   Per ID patient will need four weeks of IV antibiotic at minimum. Spoke with Jeri Modena from [place]. She has completed teaching with family.  HH referral for RN sent to  [place] [facility].   Referral accepted by [facility]. SOC no sooner than 4/7. MD and Infectious Disease MD MD notified.          Expected Discharge Plan and Services                                               Social Determinants of Health (SDOH) Interventions SDOH Screenings   Food Insecurity: No Food Insecurity (01/04/2023)  Housing: Low Risk  (01/04/2023)  Transportation Needs: No Transportation Needs (01/04/2023)  Utilities: Not At Risk (01/04/2023)  Alcohol Screen: Low Risk  (01/04/2023)  Depression (PHQ2-9): Low Risk  (01/04/2023)  Financial Resource Strain: Low Risk  (01/04/2023)  Physical Activity: Sufficiently Active (01/04/2023)  Social Connections: Moderately Integrated (01/04/2023)  Stress: No Stress Concern Present (01/04/2023)  Tobacco Use: Low Risk  (02/11/2023)    Readmission Risk Interventions     No data to display

## 2023-02-11 NOTE — Progress Notes (Signed)
PROGRESS NOTE    Brett Bender Pistole   ZOX:[id number] DOB: May 22, 1946  DOA: 02/07/2023 Date of Service: 02/11/23 PCP: Malva LimesFisher, Donald E, MD     Brief Narrative / Hospital Course:  Brett Bender Lonardo is a 77 y.o. male with a history of renal stones, BCC, BPH, DDD presents with latered mental status and concern for accidental over dose of pain meds and muscle relaxants. EMS gave him narcan. Pt has had lower back pain and rt sided pain for a few days. He had seen his PCP the day before presentation 04/01: In the ED vitals were BP of 131/65, temperature of 98.6, heart rate 63 and pulse ox 98%. WBC was 22.5, Hb 14.2, platelet 156 and creatinine 1.66.  Blood cultures were sent. CT head did not reveal any acute findings. CT angio of the chest showed no intrathoracic pathology, no PE, MRI of the lumbar spine did not reveal any discitis or osteomyelitis or abscess.Marland Kitchen.  UA was 0-5 WBC. CT scan of the abdomen in the past had revealed multiple nonobstructive small stones in the right kidney and left.  Patient was started on broad-spectrum antibiotics.  04/02: Blood cultures showed Staph aureus. ID consulted. Even though there were 2 culture sent it look like it was taken in the same site. Patient has multiple wounds on his body from nervous picking of skin. The one on the upper back was sore and he was given some topical antibiotic cream by by his dermatologist.  He also has tears on his lower extremity which is healing. Started on cefazolin.  04/03: Cr improving, WBC improving. TTE done. Cardiology consulted for TEE   04/04: no concerning findings on TTE, TEE pending. ID following.  04/05: TEE aborted d/t respiratory compromise, plan for this outpatient w/ anesthesia. PICC placed. Outpatient IV Abx plan for home - daughter can help,home health can see him 04/07. Plan for discharge early morning Sunday 04/07 if he remains stable.   Consultants:  Infectious disease  Cardiology  Procedures: Transesophageal  Echocardiogram 02/11/23       ASSESSMENT & PLAN:   Principal Problem:   Sepsis Active Problems:   Right flank pain   Overdose opiate, accidental or unintentional, initial encounter   Essential (primary) hypertension   Lumbosacral neuritis   BPH (benign prostatic hyperplasia)   History of nephrolithiasis   Anxiety   AKI (acute kidney injury)   Acute metabolic encephalopathy    Sepsis d/t bacteremia Staph aureus bacteremia  Bacteremic source potentially skin wounds from nervous picking at skin Sepsis criteria include fever, leukocytosis, lactic acidosis and elevated procalcitonin MRI of the lumbar spine is negative for discitis or osteomyelitis as he has degenerative disc disease ID consulted, appreciate abx management - was on vancomycin, cefepime and Flagyl.  Discontinued these and start cefazolin 04/02 Wound culture also (+)staph aureus  Pending TEE   Altered mental status d/t acute toxic/metabolic encephalopathy from sepsis + opioids - resolved Monitor  Avoid high dose opiates and other sedating medications   Right flank pain Suspect musculoskeletal versus GU etiology MRI lumbosacral shows multiple level degenerative changes lumbar spine with history of lumbar neuritis CT renal stone study showing renal calculi but no ureteral stones, no hydronephrosis Can trial NSAIDs with close monitoring of renal function.   cautious narcotics    Overdose opiate, accidental or unintentional, initial encounter Acute metabolic encephalopathy Mental status improved with Narcan on the scene by EMS UDS --> (+)opiates no other illicits/rx   AKI (acute kidney injury) - improving  Suspecting acute with creatinine 1.66, most recent baseline a year ago was 1.2 Fluids IV -->  po  Monitor BMP   Anxiety Remeron nightly Hold alprazolam   History of nephrolithiasis Multiple nonobstructing renal calculi with no hydronephrosis Follow outpatient vs repeat imaging as needed   BPH  (benign prostatic hyperplasia) Continue tamsulosin and finasteride   Lumbosacral neuritis History of lumbosacral decompressive surgery Multimodal pain control with muscle relaxants, heating pad, gabapentin occasional NSAID until more awake.   Will avoid narcotics in the setting of accidental overdose   Essential (primary) hypertension Patient is normotensive Given sepsis, held home Tenoretic and amlodipine for now, BP has been normotensive      DVT prophylaxis: lovenox  Pertinent IV fluids/nutrition: no continuous IV fluids  Central lines / invasive devices: none  Code Status: FULL CODE   Current Admission Status: inpatient   TOC needs / Dispo plan: pending - ambulating, may not need PT/OT, will likely need long term IV abx  Barriers to discharge / significant pending items: TEE, IV abx plan, pend repeat BCx will be here 1-2 more nights              Subjective / Brief ROS:  Patient reports back pain but no other concerns today Denies CP/SOB.  Denies new weakness.  Tolerating diet Reports no concerns w/ urination/defecation.   Family Communication: wife at bedside on rounds     Objective Findings:  Vitals:   02/11/23 0900 02/11/23 1005 02/11/23 1203 02/11/23 1615  BP: 131/75 131/83 (!) 145/80 (!) 157/81  Pulse: 69 63 64 75  Resp: 20 16 16 16   Temp:  98.1 F (36.7 C) 97.9 F (36.6 C) 98.5 F (36.9 C)  TempSrc:      SpO2: 91% 93% 94% 90%  Weight:      Height:        Intake/Output Summary (Last 24 hours) at 02/11/2023 1630 Last data filed at 02/11/2023 0456 Gross per 24 hour  Intake 701.35 ml  Output 2150 ml  Net -1448.65 ml   Filed Weights   02/08/23 0706 02/11/23 0739  Weight: 93.4 kg 93.4 kg    Examination:  Physical Exam Constitutional:      Appearance: Normal appearance.  Cardiovascular:     Rate and Rhythm: Normal rate and regular rhythm.  Pulmonary:     Effort: Pulmonary effort is normal.     Breath sounds: Normal breath sounds.   Abdominal:     General: Abdomen is flat.     Palpations: Abdomen is soft.  Musculoskeletal:     Right lower leg: No edema.     Left lower leg: No edema.  Skin:    General: Skin is warm and dry.  Neurological:     General: No focal deficit present.     Mental Status: He is alert. Mental status is at baseline.          Scheduled Medications:   acidophilus  1 capsule Oral Daily   aspirin  81 mg Oral Daily   Chlorhexidine Gluconate Cloth  6 each Topical Daily   enoxaparin (LOVENOX) injection  40 mg Subcutaneous Q24H   fentaNYL       finasteride  5 mg Oral Daily   latanoprost  1 drop Both Eyes QHS   loratadine  10 mg Oral Daily   midazolam       mirtazapine  30 mg Oral QHS   multivitamin with minerals  1 tablet Oral Daily   tamsulosin  0.4 mg  Oral Daily   timolol  1 drop Both Eyes Daily    Continuous Infusions:  sodium chloride Stopped (02/10/23 1420)    ceFAZolin (ANCEF) IV 2 g (02/11/23 1443)    PRN Medications:  sodium chloride, acetaminophen **OR** acetaminophen, diclofenac Sodium, fentaNYL, guaiFENesin-dextromethorphan, ibuprofen, ipratropium-albuterol, midazolam, naLOXone (NARCAN)  injection, ondansetron **OR** ondansetron (ZOFRAN) IV, oxyCODONE  Antimicrobials from admission:  Anti-infectives (From admission, onward)    Start     Dose/Rate Route Frequency Ordered Stop   02/09/23 0000  vancomycin (VANCOREADY) IVPB 1500 mg/300 mL  Status:  Discontinued        1,500 mg 150 mL/hr over 120 Minutes Intravenous Every 24 hours 02/08/23 0445 02/08/23 1621   02/08/23 2200  ceFAZolin (ANCEF) IVPB 2g/100 mL premix        2 g 200 mL/hr over 30 Minutes Intravenous Every 8 hours 02/08/23 1621     02/08/23 1200  ceFEPIme (MAXIPIME) 2 g in sodium chloride 0.9 % 100 mL IVPB  Status:  Discontinued        2 g 200 mL/hr over 30 Minutes Intravenous Every 12 hours 02/08/23 0439 02/08/23 1621   02/08/23 0600  metroNIDAZOLE (FLAGYL) IVPB 500 mg  Status:  Discontinued        500  mg 100 mL/hr over 60 Minutes Intravenous Every 12 hours 02/08/23 0431 02/08/23 1621   02/08/23 0130  ceFEPIme (MAXIPIME) 2 g in sodium chloride 0.9 % 100 mL IVPB        2 g 200 mL/hr over 30 Minutes Intravenous  Once 02/08/23 0127 02/08/23 0316   02/08/23 0130  vancomycin (VANCOREADY) IVPB 2000 mg/400 mL        2,000 mg 200 mL/hr over 120 Minutes Intravenous  Once 02/08/23 0127 02/08/23 0524           Data Reviewed:  I have personally reviewed the following...  CBC: Recent Labs  Lab 02/07/23 2335 02/08/23 0638 02/09/23 0533  WBC 22.5* 20.3* 16.4*  NEUTROABS 18.6*  --   --   HGB 14.2 13.2 13.6  HCT 43.5 41.3 40.8  MCV 93.8 96.5 91.9  PLT 156 141* 126*   Basic Metabolic Panel: Recent Labs  Lab 02/07/23 2335 02/08/23 0638 02/09/23 0533 02/10/23 0506 02/11/23 0509  NA 135  --  135 137 140  K 3.5  --  3.4* 3.2* 3.6  CL 103  --  101 103 107  CO2 21*  --  24 24 26   GLUCOSE 207*  --  146* 120* 133*  BUN 52*  --  57* 43* 27*  CREATININE 1.66* 1.72* 1.56* 1.28* 0.96  CALCIUM 8.6*  --  8.5* 8.2* 8.4*   GFR: Estimated Creatinine Clearance: 80.4 mL/min (by C-G formula based on SCr of 0.96 mg/dL). Liver Function Tests: Recent Labs  Lab 02/07/23 2335 02/09/23 0533  AST 28 27  ALT 22 29  ALKPHOS 73 73  BILITOT 0.9 1.1  PROT 6.8 6.1*  ALBUMIN 3.4* 2.7*   No results for input(s): "LIPASE", "AMYLASE" in the last 168 hours. No results for input(s): "AMMONIA" in the last 168 hours. Coagulation Profile: Recent Labs  Lab 02/07/23 2335  INR 1.2   Cardiac Enzymes: No results for input(s): "CKTOTAL", "CKMB", "CKMBINDEX", "TROPONINI" in the last 168 hours. BNP (last 3 results) No results for input(s): "PROBNP" in the last 8760 hours. HbA1C: No results for input(s): "HGBA1C" in the last 72 hours. CBG: No results for input(s): "GLUCAP" in the last 168 hours. Lipid Profile: No results for  input(s): "CHOL", "HDL", "LDLCALC", "TRIG", "CHOLHDL", "LDLDIRECT" in the last  72 hours. Thyroid Function Tests: No results for input(s): "TSH", "T4TOTAL", "FREET4", "T3FREE", "THYROIDAB" in the last 72 hours. Anemia Panel: No results for input(s): "VITAMINB12", "FOLATE", "FERRITIN", "TIBC", "IRON", "RETICCTPCT" in the last 72 hours. Most Recent Urinalysis On File:     Component Value Date/Time   COLORURINE AMBER (A) 02/07/2023 2335   APPEARANCEUR HAZY (A) 02/07/2023 2335   APPEARANCEUR Clear 08/07/2021 1118   LABSPEC 1.026 02/07/2023 2335   LABSPEC 1.008 02/27/2012 1139   PHURINE 5.0 02/07/2023 2335   GLUCOSEU NEGATIVE 02/07/2023 2335   GLUCOSEU Negative 02/27/2012 1139   HGBUR NEGATIVE 02/07/2023 2335   BILIRUBINUR NEGATIVE 02/07/2023 2335   BILIRUBINUR neg 02/07/2023 0946   BILIRUBINUR Negative 08/07/2021 1118   BILIRUBINUR Negative 02/27/2012 1139   KETONESUR NEGATIVE 02/07/2023 2335   PROTEINUR 100 (A) 02/07/2023 2335   UROBILINOGEN 0.2 02/07/2023 0946   UROBILINOGEN 1.0 04/28/2010 1200   NITRITE NEGATIVE 02/07/2023 2335   LEUKOCYTESUR NEGATIVE 02/07/2023 2335   LEUKOCYTESUR Negative 02/27/2012 1139   Sepsis Labs: @LABRCNTIP (procalcitonin:4,lacticidven:4) Microbiology: Recent Results (from the past 240 hour(s))  Urine Culture     Status: None   Collection Time: 02/07/23 12:00 AM   Specimen: Urine   Urine  Result Value Ref Range Status   Urine Culture, Routine Final report  Final   Organism ID, Bacteria No growth  Final  Urine Culture (for pregnant, neutropenic or urologic patients or patients with an indwelling urinary catheter)     Status: Abnormal   Collection Time: 02/07/23 12:19 PM   Specimen: Urine, Clean Catch  Result Value Ref Range Status   Specimen Description   Final    URINE, CLEAN CATCH Performed at [facility], [address], [place], [place] [address]    Special Requests   Final    NONE Performed at [facility], [address] [place], [place] [address]    Culture (A)  Final    <10,000 COLONIES/mL  INSIGNIFICANT GROWTH Performed at [facility], [address], [place], [place] [address]    Report Status 02/09/2023 FINAL  Final  Blood Culture (routine x 2)     Status: Abnormal   Collection Time: 02/07/23 11:37 PM   Specimen: BLOOD  Result Value Ref Range Status   Specimen Description   Final    BLOOD  LEFT FOREARM Performed at [facility], [address], [place], [place] [address]    Special Requests   Final    BOTTLES DRAWN AEROBIC AND ANAEROBIC Blood Culture adequate volume Performed at [facility], [address], [place], [place] [address]    Culture  Setup Time   Final    GRAM POSITIVE COCCI IN BOTH AEROBIC AND ANAEROBIC BOTTLES CRITICAL RESULT CALLED TO, READ BACK BY AND VERIFIED WITH: Jobe Marker 02/08/23 1153 MW Performed at [facility], [address] [place], [place] [address]    Culture (A)  Final    STAPHYLOCOCCUS AUREUS SUSCEPTIBILITIES PERFORMED ON PREVIOUS CULTURE WITHIN THE LAST 5 DAYS. Performed at [facility], [address] [place], [place] [address]    Report Status 02/10/2023 FINAL  Final  Blood Culture (routine x 2)     Status: Abnormal   Collection Time: 02/07/23 11:37 PM   Specimen: BLOOD  Result Value Ref Range Status   Specimen Description   Final    BLOOD  LEFT AC Performed at [facility], [address] [place], [place] [address]    Special Requests   Final    BOTTLES DRAWN AEROBIC AND ANAEROBIC Blood Culture adequate volume Performed at [facility], [address] [place], [place] [address]    Culture  Setup Time   Final    GRAM POSITIVE COCCI IN BOTH AEROBIC AND ANAEROBIC BOTTLES Organism ID to follow CRITICAL RESULT CALLED TO, READ BACK BY AND VERIFIED WITH: Jobe Marker 02/08/23 1153 MW Performed at [facility], [address] [place], [place] [address]    Culture STAPHYLOCOCCUS AUREUS (A)  Final   Report Status 02/10/2023 FINAL  Final   Organism  ID, Bacteria STAPHYLOCOCCUS AUREUS  Final      Susceptibility   Staphylococcus aureus - MIC*    CIPROFLOXACIN <=0.5 SENSITIVE Sensitive     ERYTHROMYCIN >=8 RESISTANT Resistant     GENTAMICIN <=0.5 SENSITIVE Sensitive     OXACILLIN 0.5 SENSITIVE Sensitive     TETRACYCLINE <=1 SENSITIVE Sensitive     VANCOMYCIN 1 SENSITIVE Sensitive     TRIMETH/SULFA <=10 SENSITIVE Sensitive     CLINDAMYCIN <=0.25 SENSITIVE Sensitive     RIFAMPIN <=0.5 SENSITIVE Sensitive     Inducible Clindamycin NEGATIVE Sensitive     * STAPHYLOCOCCUS AUREUS  Blood Culture ID Panel (Reflexed)     Status: Abnormal   Collection Time: 02/07/23 11:37 PM  Result Value Ref Range Status   Enterococcus faecalis NOT DETECTED NOT DETECTED Final   Enterococcus Faecium NOT DETECTED NOT DETECTED Final   Listeria monocytogenes NOT DETECTED NOT DETECTED Final   Staphylococcus species DETECTED (A) NOT DETECTED Final    Comment: CRITICAL RESULT CALLED TO, READ BACK BY AND VERIFIED WITH: ALVERA MADUME 02/08/23 1154 MW    Staphylococcus aureus (BCID) DETECTED (A) NOT DETECTED Final    Comment: CRITICAL RESULT CALLED TO, READ BACK BY AND VERIFIED WITH: ALVERA MADUME 02/08/23 1154 MW    Staphylococcus epidermidis NOT DETECTED NOT DETECTED Final   Staphylococcus lugdunensis NOT DETECTED NOT DETECTED Final   Streptococcus species NOT DETECTED NOT DETECTED Final   Streptococcus agalactiae NOT DETECTED NOT DETECTED Final   Streptococcus pneumoniae NOT DETECTED NOT DETECTED Final   Streptococcus pyogenes NOT DETECTED NOT DETECTED Final   A.calcoaceticus-baumannii NOT DETECTED NOT DETECTED Final   Bacteroides fragilis NOT DETECTED NOT DETECTED Final   Enterobacterales NOT DETECTED NOT DETECTED Final   Enterobacter cloacae complex NOT DETECTED NOT DETECTED Final   Escherichia coli NOT DETECTED NOT DETECTED Final   Klebsiella aerogenes NOT DETECTED NOT DETECTED Final   Klebsiella oxytoca NOT DETECTED NOT DETECTED Final   Klebsiella  pneumoniae NOT DETECTED NOT DETECTED Final   Proteus species NOT DETECTED NOT DETECTED Final   Salmonella species NOT DETECTED NOT DETECTED Final   Serratia marcescens NOT DETECTED NOT DETECTED Final   Haemophilus influenzae NOT DETECTED NOT DETECTED Final   Neisseria meningitidis NOT DETECTED NOT DETECTED Final   Pseudomonas aeruginosa NOT DETECTED NOT DETECTED Final   Stenotrophomonas maltophilia NOT DETECTED NOT DETECTED Final   Candida albicans NOT DETECTED NOT DETECTED Final   Candida auris NOT DETECTED NOT DETECTED Final   Candida glabrata NOT DETECTED NOT DETECTED Final   Candida krusei NOT DETECTED NOT DETECTED Final   Candida parapsilosis NOT DETECTED NOT DETECTED Final   Candida tropicalis NOT DETECTED NOT DETECTED Final   Cryptococcus neoformans/gattii NOT DETECTED NOT DETECTED Final   Meth resistant mecA/C and MREJ NOT DETECTED NOT DETECTED Final    Comment: Performed at [facility], [address] [place], [place] [address]  Resp panel by RT-PCR (RSV, Flu A&B, Covid) Anterior Nasal Swab     Status: None   Collection Time: 02/08/23  4:31 AM   Specimen: Anterior Nasal Swab  Result Value Ref Range Status   SARS Coronavirus 2 by RT PCR NEGATIVE NEGATIVE Final    Comment: (NOTE) SARS-CoV-2 target nucleic acids are NOT DETECTED.  The SARS-CoV-2 RNA is generally detectable in upper respiratory specimens during the acute phase of infection. The lowest concentration of SARS-CoV-2 viral copies this assay can detect is 138 copies/mL. A negative result does not preclude SARS-Cov-2 infection and should not be used as the sole basis for treatment or other patient management decisions. A negative result may occur with  improper specimen collection/handling, submission of specimen other than nasopharyngeal swab, presence of viral mutation(s) within the areas targeted by this assay, and inadequate number of viral copies(<138 copies/mL). A negative result must be combined  with clinical observations, patient history, and epidemiological information. The expected result is Negative.  Fact Sheet for Patients:  BloggerCourse.com  Fact Sheet for Healthcare Providers:  SeriousBroker.it  This test is no t yet approved or cleared by the [place] FDA and  has been authorized for detection and/or diagnosis of SARS-CoV-2 by FDA under an Emergency Use Authorization (EUA). This EUA will remain  in effect (meaning this test can be used) for the duration of the COVID-19 declaration under Section 564(b)(1) of the Act, 21 U.S.C.section 360bbb-3(b)(1), unless the authorization is terminated  or revoked sooner.       Influenza A by PCR NEGATIVE NEGATIVE Final   Influenza B by PCR NEGATIVE NEGATIVE Final    Comment: (NOTE) The Xpert Xpress SARS-CoV-2/FLU/RSV plus assay is intended as an aid in the diagnosis of influenza from Nasopharyngeal swab specimens and should not be used as a sole basis for treatment. Nasal washings and aspirates are unacceptable for Xpert Xpress SARS-CoV-2/FLU/RSV testing.  Fact Sheet for Patients: BloggerCourse.com  Fact Sheet for Healthcare Providers: SeriousBroker.it  This test is not yet approved or cleared by the [place] FDA and has been authorized for detection and/or diagnosis of SARS-CoV-2 by FDA under an Emergency Use Authorization (EUA). This EUA will remain in effect (meaning this test can be used) for the duration of the COVID-19 declaration under Section 564(b)(1) of the Act, 21 U.S.C. section 360bbb-3(b)(1), unless the authorization is terminated or revoked.     Resp Syncytial Virus by PCR NEGATIVE NEGATIVE Final    Comment: (NOTE) Fact Sheet for Patients: BloggerCourse.com  Fact Sheet for Healthcare Providers: SeriousBroker.it  This test is not yet approved  or cleared by the [place] FDA and has been authorized for detection and/or diagnosis of SARS-CoV-2 by FDA under an Emergency Use Authorization (EUA). This EUA will remain in effect (meaning this test can be used) for the duration of the COVID-19 declaration under Section 564(b)(1) of the Act, 21 U.S.C. section 360bbb-3(b)(1), unless the authorization is terminated or revoked.  Performed at [facility], [address], [place], [place] [address]   Aerobic Culture w Gram Stain (superficial specimen)     Status: None   Collection Time: 02/08/23  4:25 PM   Specimen: Wound  Result Value Ref Range Status   Specimen Description   Final    WOUND Performed at [facility], [address] [place], [place] [address]    Special Requests   Final    BACK Performed at [facility], [address] [place], [place] [address]    Gram Stain   Final    NO WBC SEEN FEW GRAM POSITIVE COCCI IN PAIRS Performed at [facility], [address] [place], [place] [address]    Culture MODERATE STAPHYLOCOCCUS AUREUS  Final   Report Status 02/11/2023 FINAL  Final   Organism ID, Bacteria STAPHYLOCOCCUS AUREUS  Final      Susceptibility   Staphylococcus aureus - MIC*    CIPROFLOXACIN <=0.5 SENSITIVE Sensitive     ERYTHROMYCIN >=8 RESISTANT Resistant     GENTAMICIN <=0.5 SENSITIVE Sensitive     OXACILLIN 0.5 SENSITIVE Sensitive     TETRACYCLINE <=1 SENSITIVE Sensitive     VANCOMYCIN 1 SENSITIVE Sensitive     TRIMETH/SULFA <=10 SENSITIVE Sensitive     CLINDAMYCIN <=0.25 SENSITIVE Sensitive     RIFAMPIN <=0.5 SENSITIVE Sensitive     Inducible Clindamycin NEGATIVE Sensitive     * MODERATE STAPHYLOCOCCUS AUREUS  Culture, blood (Routine X 2) w Reflex to ID Panel     Status: None (Preliminary result)   Collection Time: 02/09/23  1:19 PM   Specimen: BLOOD  Result Value Ref Range Status   Specimen Description [facility]  Final   Special Requests   Final     BOTTLES DRAWN AEROBIC AND ANAEROBIC Blood Culture adequate volume   Culture   Final    NO GROWTH 2 DAYS Performed at [facility], [address] [place], [place] [address]    Report Status PENDING  Incomplete  Culture, blood (Routine X 2) w Reflex to ID Panel     Status: None (Preliminary result)   Collection Time: 02/09/23  1:19 PM   Specimen: BLOOD  Result Value Ref Range Status   Specimen Description [facility]LOOD LEFT [facility]  Final   Special Requests   Final    BOTTLES DRAWN AEROBIC AND ANAEROBIC Blood Culture adequate volume   Culture   Final    NO GROWTH 2 DAYS Performed at [facility], [address] [place], [place] [address]    Report Status PENDING  Incomplete      Radiology Studies last 3 days: US EKG SITE RITE  Result Date: 02/11/2023 If Site Rite image not attached, placement could not be confirmed due to current cardiac rhythm.  ECHOCARDIOGRAM COMPLETE  Result Date: 02/09/2023    ECHOCARDIOGRAM REPORT   Patient Name:   Brett Bender Ailey Date of Exam: 02/09/2023 Medical Rec #:  [id number]      Height:       76.0 in Accession #:    [id number]     Weight:       205.9 lb Date of Birth:  [birth year]     BSA:          2.243 m Patient Age:    76 years       BP:           121/69 mmHg Patient Gender: M              HR:           65 bpm. Exam Location:  [facility] Procedure: 2D Echo, Cardiac Doppler and Color Doppler Indications:     Bacteremia  History:         Patient has no prior history of Echocardiogram examinations.                  Signs/Symptoms:Bacteremia; Risk Factors:Hypertension.  Sonographer:     Mikki Harbororothy Buchanan Referring Phys:  [phone]  Sondra Barges Diagnosing Phys: Debbe Odea MD  Sonographer Comments: Image acquisition challenging due to respiratory motion. IMPRESSIONS  1. Left ventricular ejection fraction, by estimation, is 60 to 65%. The left ventricle has normal function. The left ventricle has no regional wall motion abnormalities. Left ventricular diastolic  parameters are consistent with Grade I diastolic dysfunction (impaired relaxation).  2. Right ventricular systolic function is normal. The right ventricular size is normal. There is moderately elevated pulmonary artery systolic pressure.  3. The mitral valve is normal in structure. Trivial mitral valve regurgitation.  4. The aortic valve was not well visualized. Aortic valve regurgitation is trivial.  5. Aortic dilatation noted. There is mild dilatation of the aortic root, measuring 40 mm. FINDINGS  Left Ventricle: Left ventricular ejection fraction, by estimation, is 60 to 65%. The left ventricle has normal function. The left ventricle has no regional wall motion abnormalities. The left ventricular internal cavity size was normal in size. There is  no left ventricular hypertrophy. Left ventricular diastolic parameters are consistent with Grade I diastolic dysfunction (impaired relaxation). Right Ventricle: The right ventricular size is normal. No increase in right ventricular wall thickness. Right ventricular systolic function is normal. There is moderately elevated pulmonary artery systolic pressure. The tricuspid regurgitant velocity is 3.29 m/s, and with an assumed right atrial pressure of 8 mmHg, the estimated right ventricular systolic pressure is 51.3 mmHg. Left Atrium: Left atrial size was normal in size. Right Atrium: Right atrial size was normal in size. Pericardium: There is no evidence of pericardial effusion. Mitral Valve: The mitral valve is normal in structure. Trivial mitral valve regurgitation. MV peak gradient, 4.6 mmHg. The mean mitral valve gradient is 1.0 mmHg. Tricuspid Valve: The tricuspid valve is normal in structure. Tricuspid valve regurgitation is mild. Aortic Valve: The aortic valve was not well visualized. Aortic valve regurgitation is trivial. Aortic valve mean gradient measures 7.0 mmHg. Aortic valve peak gradient measures 13.1 mmHg. Aortic valve area, by VTI measures 2.52 cm.  Pulmonic Valve: The pulmonic valve was not well visualized. Pulmonic valve regurgitation is not visualized. Aorta: Aortic dilatation noted. There is mild dilatation of the aortic root, measuring 40 mm. Venous: The inferior vena cava was not well visualized. IAS/Shunts: No atrial level shunt detected by color flow Doppler.  LEFT VENTRICLE PLAX 2D LVIDd:         5.10 cm   Diastology LVIDs:         3.20 cm   LV e' medial:    7.29 cm/s LV PW:         1.10 cm   LV E/e' medial:  9.4 LV IVS:        1.20 cm   LV e' lateral:   11.50 cm/s LVOT diam:     1.90 cm   LV E/e' lateral: 6.0 LV SV:         99 LV SV Index:   44 LVOT Area:     2.84 cm  RIGHT VENTRICLE RV Basal diam:  3.75 cm RV Mid diam:    3.00 cm RV S prime:     16.80 cm/s TAPSE (M-mode): 2.9 cm LEFT ATRIUM             Index        RIGHT ATRIUM           Index LA diam:        4.50 cm 2.01 cm/m   RA Area:     20.20 cm LA Vol (A2C):  84.6 ml 37.72 ml/m  RA Volume:   57.00 ml  25.41 ml/m LA Vol (A4C):   53.5 ml 23.85 ml/m LA Biplane Vol: 71.3 ml 31.79 ml/m  AORTIC VALVE                     PULMONIC VALVE AV Area (Vmax):    2.48 cm      PV Vmax:       1.03 m/s AV Area (Vmean):   2.25 cm      PV Peak grad:  4.2 mmHg AV Area (VTI):     2.52 cm AV Vmax:           181.00 cm/s AV Vmean:          125.000 cm/s AV VTI:            0.392 m AV Peak Grad:      13.1 mmHg AV Mean Grad:      7.0 mmHg LVOT Vmax:         158.00 cm/s LVOT Vmean:        99.100 cm/s LVOT VTI:          0.348 m LVOT/AV VTI ratio: 0.89  AORTA Ao Root diam: 4.00 cm Ao Asc diam:  3.60 cm MITRAL VALVE               TRICUSPID VALVE MV Area (PHT): 3.16 cm    TR Peak grad:   43.3 mmHg MV Area VTI:   3.08 cm    TR Vmax:        329.00 cm/s MV Peak grad:  4.6 mmHg MV Mean grad:  1.0 mmHg    SHUNTS MV Vmax:       1.07 m/s    Systemic VTI:  0.35 m MV Vmean:      52.1 cm/s   Systemic Diam: 1.90 cm MV Decel Time: 240 msec MV E velocity: 68.70 cm/s MV A velocity: 99.00 cm/s MV E/A ratio:  0.69 Debbe Odea  MD Electronically signed by Debbe Odea MD Signature Date/Time: 02/09/2023/1:38:38 PM    Final    MR Lumbar Spine W Wo Contrast  Result Date: 02/08/2023 CLINICAL DATA:  Septic with severe right-sided lumbar pain EXAM: MRI LUMBAR SPINE WITHOUT AND WITH CONTRAST TECHNIQUE: Multiplanar and multiecho pulse sequences of the lumbar spine were obtained without and with intravenous contrast. CONTRAST:  10mL GADAVIST GADOBUTROL 1 MMOL/ML IV SOLN COMPARISON:  07/24/2018 FINDINGS: Segmentation: 5 non rib-bearing vertebral bodies. The lowest fully formed disc space is labeled L5-S1. Alignment: 5 mm anterolisthesis of L4 on L5 unchanged from prior exam. 4 mm retrolisthesis of L3 on L4 appears new from the prior exam. Mild dextrocurvature. Vertebrae: No acute fracture or suspicious osseous lesion. A small amount of enhancement is noted at the inferior aspect of T12, where a Schmorl's node appears to be forming, and about the L3-L4 disc space, which correlates with edema on the STIR and is favored to be related to adjacent degenerative changes. No evidence of discitis. Postsurgical changes at L3-L4 and L4-L5. Conus medullaris and cauda equina: Conus extends to the L2 level. Conus and cauda equina appear normal. No abnormal enhancement. No epidural collection. Paraspinal and other soft tissues: Negative. Disc levels: T12-L1: Minimal disc bulge. Mild facet arthropathy. No spinal canal stenosis or neural foraminal narrowing. L1-L2: No significant disc bulge. Mild facet arthropathy. No spinal canal stenosis or neural foraminal narrowing. L2-L3: Minimal disc bulge. Mild facet arthropathy. Ligamentum flavum hypertrophy. Mild spinal canal  stenosis and mild bilateral neural foraminal narrowing, which have progressed from the prior exam. Narrowing of the lateral recesses. L3-L4: Mild retrolisthesis with disc osteophyte complex. Moderate facet arthropathy. Interval laminectomy. No residual spinal canal stenosis. Narrowing of the left  lateral recess. Moderate left-greater-than-right neural foraminal narrowing, similar to prior L4-L5: Status post fusion. Grade 1 anterolisthesis with disc unroofing. Severe facet arthropathy. Moderate spinal canal stenosis, which has progressed from the prior exam. Narrowing of the lateral recesses. No neural foraminal narrowing. L5-S1: No significant disc bulge. Possible granulation tissue along the anterior aspect of the thecal sac versus extruded disc. Moderate to severe right and mild left facet arthropathy. Mild spinal canal stenosis. Narrowing of the lateral recesses. Moderate right neural foraminal narrowing, which has progressed from the prior exam IMPRESSION: 1. No evidence of discitis or osteomyelitis. No epidural collection. 2. L4-L5 moderate spinal canal stenosis, which has progressed from the prior exam. 3. L5-S1 mild spinal canal stenosis and moderate right neural foraminal narrowing, which has progressed from the prior exam. 4. L3-L4 moderate left-greater-than-right neural foraminal narrowing. 5. L2-L3 mild spinal canal stenosis and mild bilateral neural foraminal narrowing. 6. Narrowing of the lateral recesses at L2-L3, L3-L4, L4-L5, and L5-S1 could affect the descending L3, L4, L5, and S1 nerve roots, respectively. Electronically Signed   By: Wiliam Ke M.D.   On: 02/08/2023 02:26   CT Renal Stone Study  Result Date: 02/08/2023 CLINICAL DATA:  Right back and flank pain.  Urolithiasis. EXAM: CT ABDOMEN AND PELVIS WITHOUT CONTRAST TECHNIQUE: Multidetector CT imaging of the abdomen and pelvis was performed following the standard protocol without IV contrast. RADIATION DOSE REDUCTION: This exam was performed according to the departmental dose-optimization program which includes automated exposure control, adjustment of the mA and/or kV according to patient size and/or use of iterative reconstruction technique. COMPARISON:  05/03/2019 FINDINGS: Lower chest: No acute abnormality. Extensive coronary  artery calcification Hepatobiliary: No focal liver abnormality is seen. No gallstones, gallbladder wall thickening, or biliary dilatation. Pancreas: Unremarkable Spleen: Unremarkable Adrenals/Urinary Tract: Right adrenal gland is unremarkable. 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 No follow-up imaging is recommended for this lesion. The kidneys are normal in size and position. Multiple nonobstructing calculi are seen within the kidneys bilaterally measuring up to 8 mm within the upper pole of the right kidney and 2 mm within the interpolar region of the left kidney. Stone burden within the right kidney has progressed slightly since prior examination. No hydronephrosis. No ureteral calculi. Moderate bilateral nonspecific perinephric stranding is present, progressive since prior examination. No perinephric fluid collections are seen. The bladder is decompressed and is unremarkable. Stomach/Bowel: Stomach is within normal limits. Appendix appears normal. No evidence of bowel wall thickening, distention, or inflammatory changes. Vascular/Lymphatic: Aortic atherosclerosis. No enlarged abdominal or pelvic lymph nodes. Reproductive: Prostate is unremarkable. Other: Tiny umbilical hernia contains mesenteric fat as well as the distal tip of the appendix. Musculoskeletal: No acute bone abnormality. L4-5 anterior lumbar fusion procedure with instrumentation has been performed. No definite bridging callus identified. Advanced degenerative changes are seen at L3-4. No acute bone abnormality. No suspicious lytic or blastic bone lesion. Postsurgical changes are seen within the right iliac spine. IMPRESSION: 1. No acute intra-abdominal pathology identified. 2. Extensive coronary artery calcification. 3. 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 No follow-up imaging is recommended for this lesion. 4. Right greater than left nonobstructing nephrolithiasis. Stone burden within the right kidney has progressed  slightly since prior examination. No urolithiasis. No hydronephrosis. 5.  Progressive nonspecific bilateral perinephric stranding. Correlation with laboratory examination may be helpful to exclude an underlying infectious or inflammatory process. Aortic Atherosclerosis (ICD10-I70.0). Electronically Signed   By: Helyn Numbers M.D.   On: 02/08/2023 01:11   CT Angio Chest PE W and/or Wo Contrast  Result Date: 02/08/2023 CLINICAL DATA:  Syncope. EXAM: CT ANGIOGRAPHY CHEST WITH CONTRAST TECHNIQUE: Multidetector CT imaging of the chest was performed using the standard protocol during bolus administration of intravenous contrast. Multiplanar CT image reconstructions and MIPs were obtained to evaluate the vascular anatomy. RADIATION DOSE REDUCTION: This exam was performed according to the departmental dose-optimization program which includes automated exposure control, adjustment of the mA and/or kV according to patient size and/or use of iterative reconstruction technique. CONTRAST:  60mL OMNIPAQUE IOHEXOL 350 MG/ML SOLN COMPARISON:  Chest radiograph dated 02/07/2023. FINDINGS: Evaluation of this exam is limited due to respiratory motion artifact. Cardiovascular: There is no cardiomegaly or pericardial effusion. Coronary vascular calcification of the LAD and left circumflex artery. Mild atherosclerotic calcification of the thoracic aorta. No aneurysmal dilatation. Evaluation of the pulmonary arteries is limited due to respiratory motion and suboptimal visualization of the peripheral branches. No large central pulmonary artery embolus identified. Mediastinum/Nodes: No hilar or mediastinal adenopathy. The esophagus is grossly unremarkable. No mediastinal fluid collection. Lungs/Pleura: Bibasilar dependent atelectasis. No focal consolidation, pleural effusion, or pneumothorax. The central airways are patent. Upper Abdomen: Fatty liver.  A 2 cm left adrenal adenoma. Musculoskeletal: No acute osseous pathology. Review of the  MIP images confirms the above findings. IMPRESSION: 1. No acute intrathoracic pathology. No large central pulmonary artery embolus identified. 2. Coronary vascular calcification of the LAD and left circumflex artery. 3. Fatty liver. 4. A 2 cm left adrenal adenoma. 5.  Aortic Atherosclerosis (ICD10-I70.0). Electronically Signed   By: Elgie Collard M.D.   On: 02/08/2023 01:02   CT HEAD WO CONTRAST ( )  Result Date: 02/08/2023 CLINICAL DATA:  Altered mental status EXAM: CT HEAD WITHOUT CONTRAST TECHNIQUE: Contiguous axial images were obtained from the base of the skull through the vertex without intravenous contrast. RADIATION DOSE REDUCTION: This exam was performed according to the departmental dose-optimization program which includes automated exposure control, adjustment of the mA and/or kV according to patient size and/or use of iterative reconstruction technique. COMPARISON:  01/05/2020 FINDINGS: Brain: Normal anatomic configuration. Parenchymal volume loss is commensurate with the patient's age. Mild periventricular white matter changes are present likely reflecting the sequela of small vessel ischemia. No abnormal intra or extra-axial mass lesion or fluid collection. No abnormal mass effect or midline shift. No evidence of acute intracranial hemorrhage or infarct. Ventricular size is normal. Cerebellum unremarkable. Vascular: No asymmetric hyperdense vasculature at the skull base. Skull: Intact Sinuses/Orbits: Paranasal sinuses are clear. Orbits are unremarkable. Other: Mastoid air cells and middle ear cavities are clear. IMPRESSION: 1. No acute intracranial hemorrhage or infarct. 2. Mild senescent change. Electronically Signed   By: Helyn Numbers M.D.   On: 02/08/2023 01:01   DG Chest Port 1 View  Result Date: 02/07/2023 CLINICAL DATA:  Opiate overdose, lethargy EXAM: PORTABLE CHEST 1 VIEW COMPARISON:  06/28/2019 FINDINGS: Single frontal view of the chest demonstrates an unremarkable cardiac  silhouette. Continued ectasia of the thoracic aorta, likely not appreciably changed allowing for differences due to portable technique and AP positioning. Lung volumes are diminished, with patchy left basilar consolidation concerning for aspiration given clinical presentation. No effusion or pneumothorax. IMPRESSION: 1. Patchy left basilar consolidation, which could reflect hypoventilatory changes or aspiration given clinical  presentation. 2. Stable thoracic aortic ectasia. Electronically Signed   By: Sharlet Salina M.D.   On: 02/07/2023 23:54             LOS: 3 days       Sunnie Nielsen, DO [facility] 02/11/2023, 4:30 PM    Dictation software may have been used to generate the above note. Typos may occur and escape review in typed/dictated notes. Please contact Dr Lyn Hollingshead directly for clarity if needed.  Staff may message me via secure chat in Epic  but this may not receive an immediate response,  please page me for urgent matters!  If 7PM-7AM, please contact night coverage [email or web address]

## 2023-02-11 NOTE — Treatment Plan (Signed)
Diagnosis: MSSA bacteremia Baseline Creatinine 1    Allergies  Allergen Reactions   Penicillins Other (See Comments)    Stomach Ache    OPAT Orders Discharge antibiotics: Cefazolin 6 grams IV infusion every 24 hours for 4 weeks 03/08/23  [facility] Care Per Protocol:  Labs weekly while on IV antibiotics: X__ CBC with differential  _X_ CMP   _X_ Please pull PIC at completion of IV antibiotics   Fax weekly lab results  promptly to [phone]  Clinic Follow Up Appt: with Dr.Ples Trudel  03/01/23 at 9.30 AM   Call [phone] with any questions

## 2023-02-11 NOTE — Progress Notes (Signed)
Peripherally Inserted Central Catheter Placement  The IV Nurse has discussed with the patient and/or persons authorized to consent for the patient, the purpose of this procedure and the potential benefits and risks involved with this procedure.  The benefits include less needle sticks, lab draws from the catheter, and the patient may be discharged home with the catheter. Risks include, but not limited to, infection, bleeding, blood clot (thrombus formation), and puncture of an artery; nerve damage and irregular heartbeat and possibility to perform a PICC exchange if needed/ordered by physician.  Alternatives to this procedure were also discussed.  Bard Power PICC patient education guide, fact sheet on infection prevention and patient information card has been provided to patient /or left at bedside.    PICC Placement Documentation  PICC Single Lumen 02/11/23 Right Basilic 45 cm 0 cm (Active)  Indication for Insertion or Continuance of Line Home intravenous therapies (PICC only) 02/11/23 1700  Exposed Catheter (cm) 0 cm 02/11/23 1700  Site Assessment Clean, Dry, Intact 02/11/23 1700  Line Status Flushed;Saline locked;Blood return noted 02/11/23 1700  Dressing Type Transparent;Securing device 02/11/23 1700  Dressing Status Antimicrobial disc in place;Clean, Dry, Intact 02/11/23 1700  Safety Lock Not Applicable 02/11/23 1700  Line Care Connections checked and tightened 02/11/23 1700  Line Adjustment (NICU/IV Team Only) No 02/11/23 1700  Dressing Intervention New dressing 02/11/23 1700  Dressing Change Due 02/18/23 02/11/23 1700       Timmothy Sours 02/11/2023, 5:17 PM

## 2023-02-11 NOTE — Procedures (Signed)
Transesophageal Echocardiogram :  Indication: bacteremia  Procedure: 15 cc of viscous lidocaine were given orally to provide local anesthesia to the oropharynx. The patient was positioned supine on the left side, bite block provided. The patient was moderately sedated with the doses of versed and fentanyl as detailed below.  Using digital technique an omniplane probe advanced into the oral cavity and pharynx.  Patient had significant discomfort, cough and hypoxia with oxygen use/burping to the 80s.  TEE probe was withdrawn, oral suctioning was performed, 2 more attempts of introducing probe was unsuccessful due to patient discomfort and hypoxia.  Procedure was thus aborted.  Moderate sedation: 1. Sedation used:  Versed: 2mg , Fentanyl: 75 mcg  Recommendations. Recommend TEE with help of anesthesia.   Arlys John Agbor-Etang 02/11/2023 8:29 AM

## 2023-02-12 ENCOUNTER — Other Ambulatory Visit: Payer: Self-pay

## 2023-02-12 DIAGNOSIS — B9561 Methicillin susceptible Staphylococcus aureus infection as the cause of diseases classified elsewhere: Secondary | ICD-10-CM | POA: Diagnosis not present

## 2023-02-12 DIAGNOSIS — G9341 Metabolic encephalopathy: Secondary | ICD-10-CM | POA: Diagnosis not present

## 2023-02-12 LAB — CBC WITH DIFFERENTIAL/PLATELET
Abs Immature Granulocytes: 0.77 10*3/uL — ABNORMAL HIGH (ref 0.00–0.07)
Basophils Absolute: 0.1 10*3/uL (ref 0.0–0.1)
Basophils Relative: 1 %
Eosinophils Absolute: 0.2 10*3/uL (ref 0.0–0.5)
Eosinophils Relative: 1 %
HCT: 41.1 % (ref 39.0–52.0)
Hemoglobin: 14.2 g/dL (ref 13.0–17.0)
Immature Granulocytes: 4 %
Lymphocytes Relative: 18 %
Lymphs Abs: 3.2 10*3/uL (ref 0.7–4.0)
MCH: 30.4 pg (ref 26.0–34.0)
MCHC: 34.5 g/dL (ref 30.0–36.0)
MCV: 88 fL (ref 80.0–100.0)
Monocytes Absolute: 2 10*3/uL — ABNORMAL HIGH (ref 0.1–1.0)
Monocytes Relative: 11 %
Neutro Abs: 11.2 10*3/uL — ABNORMAL HIGH (ref 1.7–7.7)
Neutrophils Relative %: 65 %
Platelets: 169 10*3/uL (ref 150–400)
RBC: 4.67 MIL/uL (ref 4.22–5.81)
RDW: 14.3 % (ref 11.5–15.5)
WBC: 17.4 10*3/uL — ABNORMAL HIGH (ref 4.0–10.5)
nRBC: 0 % (ref 0.0–0.2)

## 2023-02-12 MED ORDER — ALUM & MAG HYDROXIDE-SIMETH 200-200-20 MG/5ML PO SUSP
30.0000 mL | Freq: Four times a day (QID) | ORAL | Status: DC | PRN
  Administered 2023-02-12 – 2023-02-13 (×4): 30 mL via ORAL
  Filled 2023-02-12 (×4): qty 30

## 2023-02-12 MED ORDER — AMLODIPINE BESYLATE 10 MG PO TABS
10.0000 mg | ORAL_TABLET | Freq: Every day | ORAL | Status: DC
  Administered 2023-02-12 – 2023-02-13 (×2): 10 mg via ORAL
  Filled 2023-02-12 (×2): qty 1

## 2023-02-12 NOTE — Progress Notes (Signed)
Mobility Specialist - Progress Note   02/12/23 1347  Mobility  Activity Ambulated with assistance in room;Ambulated with assistance to bathroom;Stood at bedside;Dangled on edge of bed  Level of Assistance Standby assist, set-up cues, supervision of patient - no hands on  Assistive Device Front wheel walker  Distance Ambulated (ft) 10 ft  Activity Response Tolerated well  Mobility Referral Yes  $Mobility charge 1 Mobility   Pt supine in bed on RA upon arrival. Pt completes bed mobility indep. Pt STS with MinA. Pt ambulates to bathroom SBA with no LOB noted. Pt left in bathroom with education on how to call for assistance.   Terrilyn Saver  Mobility Specialist  02/12/23 1:48 PM

## 2023-02-12 NOTE — TOC Progression Note (Addendum)
Transition of Care [facility]) - Progression Note    Patient Details  Name: Brett Bender MRN: [id number] Date of Birth: 04-04-1946  [facility] CM/SW Contact  Bing Quarry, RN Phone Number: 02/12/2023, 2:46 PM  Clinical Narrative: 4/6: Confirmed with [facility] that RN ready for patient to discharge and been seen tomorrow at noon. Confirmed with [employer] via [name] that medications had been delivered and ready for [facility] RN to see patient at noon. Need HH orders for RN/PT and this was requested. Per [employer], daughter Randye Lobo, will do the antibiotic/PICC line care as patient and spouse unable to grasp well enough in education session.    Randye Lobo contact number [phone] per [name]/[employer].  HH orders received in in chart.  Gabriel Cirri RN CM [phone]       Expected Discharge Plan and Services                                               Social Determinants of Health (SDOH) Interventions SDOH Screenings   Food Insecurity: No Food Insecurity (01/04/2023)  Housing: Low Risk  (01/04/2023)  Transportation Needs: No Transportation Needs (01/04/2023)  Utilities: Not At Risk (01/04/2023)  Alcohol Screen: Low Risk  (01/04/2023)  Depression (PHQ2-9): Low Risk  (01/04/2023)  Financial Resource Strain: Low Risk  (01/04/2023)  Physical Activity: Sufficiently Active (01/04/2023)  Social Connections: Moderately Integrated (01/04/2023)  Stress: No Stress Concern Present (01/04/2023)  Tobacco Use: Low Risk  (02/11/2023)    Readmission Risk Interventions     No data to display

## 2023-02-12 NOTE — Progress Notes (Signed)
PROGRESS NOTE    Brett Bender   ZOX:[id number] DOB: [birth year]  DOA: 02/07/2023 Date of Service: 02/12/23 PCP: Malva Limes, MD     Brief Narrative / Hospital Course:  Brett Bender is a 77 y.o. male with a history of renal stones, BCC, BPH, DDD presents with latered mental status and concern for accidental over dose of pain meds and muscle relaxants. EMS gave him narcan. Pt has had lower back pain and rt sided pain for a few days. He had seen his PCP the day before presentation 04/01: In the ED vitals were BP of 131/65, temperature of 98.6, heart rate 63 and pulse ox 98%. WBC was 22.5, Hb 14.2, platelet 156 and creatinine 1.66.  Blood cultures were sent. CT head did not reveal any acute findings. CT angio of the chest showed no intrathoracic pathology, no PE, MRI of the lumbar spine did not reveal any discitis or osteomyelitis or abscess.Marland Kitchen  UA was 0-5 WBC. CT scan of the abdomen in the past had revealed multiple nonobstructive small stones in the right kidney and left.  Patient was started on broad-spectrum antibiotics.  04/02: Blood cultures showed Staph aureus. ID consulted. Even though there were 2 culture sent it look like it was taken in the same site. Patient has multiple wounds on his body from nervous picking of skin. The one on the upper back was sore and he was given some topical antibiotic cream by by his dermatologist.  He also has tears on his lower extremity which is healing. Started on cefazolin.  04/03: Cr improving, WBC improving. TTE done. Cardiology consulted for TEE   04/04: no concerning findings on TTE, TEE pending. ID following.  04/05: TEE aborted d/t respiratory compromise, plan for this outpatient w/ anesthesia. PICC placed. Outpatient IV Abx plan for home - pt/wife unable to manage IV abx, daughter can help, home health can see him and educate daughter AM 04/07. Plan for discharge early morning Sunday 04/07 if he remains stable.  04/06: stable, ambulating    Consultants:  Infectious disease  Cardiology  Procedures: Transesophageal Echocardiogram 02/11/23 unable to tolerate, will need anesthesia       ASSESSMENT & PLAN:   Principal Problem:   Sepsis Active Problems:   Right flank pain   Overdose opiate, accidental or unintentional, initial encounter   Essential (primary) hypertension   Lumbosacral neuritis   BPH (benign prostatic hyperplasia)   History of nephrolithiasis   Anxiety   AKI (acute kidney injury)   Acute metabolic encephalopathy    Sepsis d/t bacteremia Staph aureus bacteremia  Bacteremic source potentially skin wounds from nervous picking at skin Sepsis criteria include fever, leukocytosis, lactic acidosis and elevated procalcitonin MRI of the lumbar spine is negative for discitis or osteomyelitis as he has degenerative disc disease ID consulted, appreciate abx management - was on vancomycin, cefepime and Flagyl.  Discontinued these and start cefazolin 04/02 Wound culture also (+)staph aureus  Pending TEE outpatient to determine total length IV abx    Altered mental status d/t acute toxic/metabolic encephalopathy from sepsis + opioids - resolved Monitor  Avoid high dose opiates and other sedating medications   Right flank pain Suspect musculoskeletal versus GU etiology MRI lumbosacral shows multiple level degenerative changes lumbar spine with history of lumbar neuritis CT renal stone study showing renal calculi but no ureteral stones, no hydronephrosis Can trial NSAIDs with close monitoring of renal function.   cautious narcotics    Overdose opiate, accidental or unintentional, initial  encounter Acute metabolic encephalopathy Mental status improved with Narcan on the scene by EMS UDS --> (+)opiates no other illicits/rx   AKI (acute kidney injury) - improving  Suspecting acute with creatinine 1.66, most recent baseline a year ago was 1.2 Fluids IV -->  po  Monitor BMP   Anxiety Remeron  nightly Hold alprazolam   History of nephrolithiasis Multiple nonobstructing renal calculi with no hydronephrosis Follow outpatient vs repeat imaging as needed   BPH (benign prostatic hyperplasia) Continue tamsulosin and finasteride   Lumbosacral neuritis History of lumbosacral decompressive surgery Multimodal pain control with muscle relaxants, heating pad, gabapentin occasional NSAID until more awake.   Will avoid narcotics in the setting of accidental overdose   Essential (primary) hypertension Patient is normotensive Given sepsis, held home Tenoretic and amlodipine for now, BP has been normotensive but creeping up, will resume amlodipine      DVT prophylaxis: lovenox  Pertinent IV fluids/nutrition: no continuous IV fluids  Central lines / invasive devices: PICC, Foley - ordered Foley removal today for void trial   Code Status: FULL CODE   Current Admission Status: inpatient   TOC needs / Dispo plan: pending - ambulating, ok for outpatient PT. He will need long term IV abx  Barriers to discharge / significant pending items: plan d/c home tomorrow - home health unable to arrange IV abx until tomorrow              Subjective / Brief ROS:  Patient reports back pain stable, reports dry mouth  Denies CP/SOB.  Denies new weakness.  Tolerating diet Reports no concerns w/ urination/defecation.   Family Communication: wife at bedside on rounds     Objective Findings:  Vitals:   02/11/23 1923 02/11/23 2317 02/12/23 0405 02/12/23 0927  BP: (!) 158/90 (!) 147/78 (!) 157/96 (!) 154/90  Pulse: 78 70 67 76  Resp: 20 20 20 18   Temp: 98.3 F (36.8 C) 98.7 F (37.1 C) 98.3 F (36.8 C) 98.1 F (36.7 C)  TempSrc:    Oral  SpO2: 92% 91% 90% 94%  Weight:      Height:        Intake/Output Summary (Last 24 hours) at 02/12/2023 1307 Last data filed at 02/12/2023 1023 Gross per 24 hour  Intake 1200 ml  Output 1400 ml  Net -200 ml   Filed Weights   02/08/23 0706  02/11/23 0739  Weight: 93.4 kg 93.4 kg    Examination:  Physical Exam Constitutional:      Appearance: Normal appearance.  Cardiovascular:     Rate and Rhythm: Normal rate and regular rhythm.  Pulmonary:     Effort: Pulmonary effort is normal.     Breath sounds: Normal breath sounds.  Abdominal:     General: Abdomen is flat.     Palpations: Abdomen is soft.  Musculoskeletal:     Right lower leg: No edema.     Left lower leg: No edema.  Skin:    General: Skin is warm and dry.  Neurological:     General: No focal deficit present.     Mental Status: He is alert. Mental status is at baseline.          Scheduled Medications:   acidophilus  1 capsule Oral Daily   amLODipine  10 mg Oral Daily   aspirin  81 mg Oral Daily   Chlorhexidine Gluconate Cloth  6 each Topical Daily   enoxaparin (LOVENOX) injection  40 mg Subcutaneous Q24H   finasteride  5 mg Oral Daily   latanoprost  1 drop Both Eyes QHS   loratadine  10 mg Oral Daily   mirtazapine  30 mg Oral QHS   multivitamin with minerals  1 tablet Oral Daily   sodium chloride flush  10-40 mL Intracatheter Q12H   tamsulosin  0.4 mg Oral Daily   timolol  1 drop Both Eyes Daily    Continuous Infusions:  sodium chloride Stopped (02/10/23 1420)    ceFAZolin (ANCEF) IV 2 g (02/12/23 0528)    PRN Medications:  sodium chloride, acetaminophen **OR** acetaminophen, alum & mag hydroxide-simeth, diclofenac Sodium, guaiFENesin-dextromethorphan, ibuprofen, ipratropium-albuterol, naLOXone (NARCAN)  injection, ondansetron **OR** ondansetron (ZOFRAN) IV, oxyCODONE, sodium chloride flush  Antimicrobials from admission:  Anti-infectives (From admission, onward)    Start     Dose/Rate Route Frequency Ordered Stop   02/09/23 0000  vancomycin (VANCOREADY) IVPB 1500 mg/300 mL  Status:  Discontinued        1,500 mg 150 mL/hr over 120 Minutes Intravenous Every 24 hours 02/08/23 0445 02/08/23 1621   02/08/23 2200  ceFAZolin (ANCEF) IVPB  2g/100 mL premix        2 g 200 mL/hr over 30 Minutes Intravenous Every 8 hours 02/08/23 1621     02/08/23 1200  ceFEPIme (MAXIPIME) 2 g in sodium chloride 0.9 % 100 mL IVPB  Status:  Discontinued        2 g 200 mL/hr over 30 Minutes Intravenous Every 12 hours 02/08/23 0439 02/08/23 1621   02/08/23 0600  metroNIDAZOLE (FLAGYL) IVPB 500 mg  Status:  Discontinued        500 mg 100 mL/hr over 60 Minutes Intravenous Every 12 hours 02/08/23 0431 02/08/23 1621   02/08/23 0130  ceFEPIme (MAXIPIME) 2 g in sodium chloride 0.9 % 100 mL IVPB        2 g 200 mL/hr over 30 Minutes Intravenous  Once 02/08/23 0127 02/08/23 0316   02/08/23 0130  vancomycin (VANCOREADY) IVPB 2000 mg/400 mL        2,000 mg 200 mL/hr over 120 Minutes Intravenous  Once 02/08/23 0127 02/08/23 0524           Data Reviewed:  I have personally reviewed the following...  CBC: Recent Labs  Lab 02/07/23 2335 02/08/23 0638 02/09/23 0533 02/12/23 0523  WBC 22.5* 20.3* 16.4* 17.4*  NEUTROABS 18.6*  --   --  11.2*  HGB 14.2 13.2 13.6 14.2  HCT 43.5 41.3 40.8 41.1  MCV 93.8 96.5 91.9 88.0  PLT 156 141* 126* 169   Basic Metabolic Panel: Recent Labs  Lab 02/07/23 2335 02/08/23 0638 02/09/23 0533 02/10/23 0506 02/11/23 0509  NA 135  --  135 137 140  K 3.5  --  3.4* 3.2* 3.6  CL 103  --  101 103 107  CO2 21*  --  24 24 26   GLUCOSE 207*  --  146* 120* 133*  BUN 52*  --  57* 43* 27*  CREATININE 1.66* 1.72* 1.56* 1.28* 0.96  CALCIUM 8.6*  --  8.5* 8.2* 8.4*   GFR: Estimated Creatinine Clearance: 80.4 mL/min (by C-G formula based on SCr of 0.96 mg/dL). Liver Function Tests: Recent Labs  Lab 02/07/23 2335 02/09/23 0533  AST 28 27  ALT 22 29  ALKPHOS 73 73  BILITOT 0.9 1.1  PROT 6.8 6.1*  ALBUMIN 3.4* 2.7*   No results for input(s): "LIPASE", "AMYLASE" in the last 168 hours. No results for input(s): "AMMONIA" in the last 168 hours. Coagulation  Profile: Recent Labs  Lab 02/07/23 2335  INR 1.2    Cardiac Enzymes: No results for input(s): "CKTOTAL", "CKMB", "CKMBINDEX", "TROPONINI" in the last 168 hours. BNP (last 3 results) No results for input(s): "PROBNP" in the last 8760 hours. HbA1C: No results for input(s): "HGBA1C" in the last 72 hours. CBG: No results for input(s): "GLUCAP" in the last 168 hours. Lipid Profile: No results for input(s): "CHOL", "HDL", "LDLCALC", "TRIG", "CHOLHDL", "LDLDIRECT" in the last 72 hours. Thyroid Function Tests: No results for input(s): "TSH", "T4TOTAL", "FREET4", "T3FREE", "THYROIDAB" in the last 72 hours. Anemia Panel: No results for input(s): "VITAMINB12", "FOLATE", "FERRITIN", "TIBC", "IRON", "RETICCTPCT" in the last 72 hours. Most Recent Urinalysis On File:     Component Value Date/Time   COLORURINE AMBER (A) 02/07/2023 2335   APPEARANCEUR HAZY (A) 02/07/2023 2335   APPEARANCEUR Clear 08/07/2021 1118   LABSPEC 1.026 02/07/2023 2335   LABSPEC 1.008 02/27/2012 1139   PHURINE 5.0 02/07/2023 2335   GLUCOSEU NEGATIVE 02/07/2023 2335   GLUCOSEU Negative 02/27/2012 1139   HGBUR NEGATIVE 02/07/2023 2335   BILIRUBINUR NEGATIVE 02/07/2023 2335   BILIRUBINUR neg 02/07/2023 0946   BILIRUBINUR Negative 08/07/2021 1118   BILIRUBINUR Negative 02/27/2012 1139   KETONESUR NEGATIVE 02/07/2023 2335   PROTEINUR 100 (A) 02/07/2023 2335   UROBILINOGEN 0.2 02/07/2023 0946   UROBILINOGEN 1.0 04/28/2010 1200   NITRITE NEGATIVE 02/07/2023 2335   LEUKOCYTESUR NEGATIVE 02/07/2023 2335   LEUKOCYTESUR Negative 02/27/2012 1139   Sepsis Labs: @LABRCNTIP (procalcitonin:4,lacticidven:4) Microbiology: Recent Results (from the past 240 hour(s))  Urine Culture     Status: None   Collection Time: 02/07/23 12:00 AM   Specimen: Urine   Urine  Result Value Ref Range Status   Urine Culture, Routine Final report  Final   Organism ID, Bacteria No growth  Final  Urine Culture (for pregnant, neutropenic or urologic patients or patients with an indwelling urinary  catheter)     Status: Abnormal   Collection Time: 02/07/23 12:19 PM   Specimen: Urine, Clean Catch  Result Value Ref Range Status   Specimen Description   Final    URINE, CLEAN CATCH Performed at [facility], [address] [place], [place] [address]    Special Requests   Final    NONE Performed at [facility], [address], [place], [place] [address]    Culture (A)  Final    <10,000 COLONIES/mL INSIGNIFICANT GROWTH Performed at [facility], [address], [place], [place] [address]    Report Status 02/09/2023 FINAL  Final  Blood Culture (routine x 2)     Status: Abnormal   Collection Time: 02/07/23 11:37 PM   Specimen: BLOOD  Result Value Ref Range Status   Specimen Description   Final    BLOOD  LEFT FOREARM Performed at [facility], [address], [place], [place] [address]    Special Requests   Final    BOTTLES DRAWN AEROBIC AND ANAEROBIC Blood Culture adequate volume Performed at [facility], [address] [place], [place] [address]    Culture  Setup Time   Final    GRAM POSITIVE COCCI IN BOTH AEROBIC AND ANAEROBIC BOTTLES CRITICAL RESULT CALLED TO, READ BACK BY AND VERIFIED WITH: Jobe Marker 02/08/23 1153 MW Performed at [facility], [address] [place], [place] [address]    Culture (A)  Final    STAPHYLOCOCCUS AUREUS SUSCEPTIBILITIES PERFORMED ON PREVIOUS CULTURE WITHIN THE LAST 5 DAYS. Performed at [facility], [address], [place], [place] [address]    Report Status 02/10/2023 FINAL  Final  Blood Culture (routine x 2)     Status: Abnormal   Collection Time: 02/07/23 11:37 PM   Specimen: BLOOD  Result Value Ref Range Status   Specimen Description   Final    BLOOD  LEFT AC Performed at [facility], [address], [place], [place] [address]    Special Requests   Final    BOTTLES DRAWN AEROBIC AND ANAEROBIC Blood Culture adequate volume Performed at [facility], [address] [place], [place] [address]    Culture  Setup Time   Final    GRAM POSITIVE COCCI IN BOTH AEROBIC AND ANAEROBIC BOTTLES Organism ID to follow CRITICAL RESULT CALLED TO, READ BACK BY AND VERIFIED WITH: Jobe Marker 02/08/23 1153 MW Performed at [facility], [address] [place], [place] [address]    Culture STAPHYLOCOCCUS AUREUS (A)  Final   Report Status 02/10/2023 FINAL  Final   Organism ID, Bacteria STAPHYLOCOCCUS AUREUS  Final      Susceptibility   Staphylococcus aureus - MIC*    CIPROFLOXACIN <=0.5 SENSITIVE Sensitive     ERYTHROMYCIN >=8 RESISTANT Resistant     GENTAMICIN <=0.5 SENSITIVE Sensitive     OXACILLIN 0.5 SENSITIVE Sensitive     TETRACYCLINE <=1 SENSITIVE Sensitive     VANCOMYCIN 1 SENSITIVE Sensitive     TRIMETH/SULFA <=10 SENSITIVE Sensitive     CLINDAMYCIN <=0.25 SENSITIVE Sensitive     RIFAMPIN <=0.5 SENSITIVE Sensitive     Inducible Clindamycin NEGATIVE Sensitive     * STAPHYLOCOCCUS AUREUS  Blood Culture ID Panel (Reflexed)     Status: Abnormal   Collection Time: 02/07/23 11:37 PM  Result Value Ref Range Status   Enterococcus faecalis NOT DETECTED NOT DETECTED Final   Enterococcus Faecium NOT DETECTED NOT DETECTED Final   Listeria monocytogenes NOT DETECTED NOT DETECTED Final   Staphylococcus species DETECTED (A) NOT DETECTED Final    Comment: CRITICAL RESULT CALLED TO, READ BACK BY AND VERIFIED WITH: ALVERA MADUME 02/08/23 1154 MW    Staphylococcus aureus (BCID) DETECTED (A) NOT DETECTED Final    Comment: CRITICAL RESULT CALLED TO, READ BACK BY AND VERIFIED WITH: ALVERA MADUME 02/08/23 1154 MW    Staphylococcus epidermidis NOT DETECTED NOT DETECTED Final   Staphylococcus lugdunensis NOT DETECTED NOT DETECTED Final   Streptococcus species NOT DETECTED NOT DETECTED Final   Streptococcus agalactiae NOT DETECTED NOT DETECTED Final   Streptococcus pneumoniae NOT DETECTED NOT DETECTED Final   Streptococcus pyogenes NOT DETECTED  NOT DETECTED Final   A.calcoaceticus-baumannii NOT DETECTED NOT DETECTED Final   Bacteroides fragilis NOT DETECTED NOT DETECTED Final   Enterobacterales NOT DETECTED NOT DETECTED Final   Enterobacter cloacae complex NOT DETECTED NOT DETECTED Final   Escherichia coli NOT DETECTED NOT DETECTED Final   Klebsiella aerogenes NOT DETECTED NOT DETECTED Final   Klebsiella oxytoca NOT DETECTED NOT DETECTED Final   Klebsiella pneumoniae NOT DETECTED NOT DETECTED Final   Proteus species NOT DETECTED NOT DETECTED Final   Salmonella species NOT DETECTED NOT DETECTED Final   Serratia marcescens NOT DETECTED NOT DETECTED Final   Haemophilus influenzae NOT DETECTED NOT DETECTED Final   Neisseria meningitidis NOT DETECTED NOT DETECTED Final   Pseudomonas aeruginosa NOT DETECTED NOT DETECTED Final   Stenotrophomonas maltophilia NOT DETECTED NOT DETECTED Final   Candida albicans NOT DETECTED NOT DETECTED Final   Candida auris NOT DETECTED NOT DETECTED Final  Candida glabrata NOT DETECTED NOT DETECTED Final   Candida krusei NOT DETECTED NOT DETECTED Final   Candida parapsilosis NOT DETECTED NOT DETECTED Final   Candida tropicalis NOT DETECTED NOT DETECTED Final   Cryptococcus neoformans/gattii NOT DETECTED NOT DETECTED Final   Meth resistant mecA/C and MREJ NOT DETECTED NOT DETECTED Final    Comment: Performed at [facility], [address] [place], [place] [address]  Resp panel by RT-PCR (RSV, Flu A&B, Covid) Anterior Nasal Swab     Status: None   Collection Time: 02/08/23  4:31 AM   Specimen: Anterior Nasal Swab  Result Value Ref Range Status   SARS Coronavirus 2 by RT PCR NEGATIVE NEGATIVE Final    Comment: (NOTE) SARS-CoV-2 target nucleic acids are NOT DETECTED.  The SARS-CoV-2 RNA is generally detectable in upper respiratory specimens during the acute phase of infection. The lowest concentration of SARS-CoV-2 viral copies this assay can detect is 138 copies/mL. A negative result  does not preclude SARS-Cov-2 infection and should not be used as the sole basis for treatment or other patient management decisions. A negative result may occur with  improper specimen collection/handling, submission of specimen other than nasopharyngeal swab, presence of viral mutation(s) within the areas targeted by this assay, and inadequate number of viral copies(<138 copies/mL). A negative result must be combined with clinical observations, patient history, and epidemiological information. The expected result is Negative.  Fact Sheet for Patients:  BloggerCourse.com  Fact Sheet for Healthcare Providers:  SeriousBroker.it  This test is no t yet approved or cleared by the [place] FDA and  has been authorized for detection and/or diagnosis of SARS-CoV-2 by FDA under an Emergency Use Authorization (EUA). This EUA will remain  in effect (meaning this test can be used) for the duration of the COVID-19 declaration under Section 564(b)(1) of the Act, 21 U.S.C.section 360bbb-3(b)(1), unless the authorization is terminated  or revoked sooner.       Influenza A by PCR NEGATIVE NEGATIVE Final   Influenza B by PCR NEGATIVE NEGATIVE Final    Comment: (NOTE) The Xpert Xpress SARS-CoV-2/FLU/RSV plus assay is intended as an aid in the diagnosis of influenza from Nasopharyngeal swab specimens and should not be used as a sole basis for treatment. Nasal washings and aspirates are unacceptable for Xpert Xpress SARS-CoV-2/FLU/RSV testing.  Fact Sheet for Patients: BloggerCourse.com  Fact Sheet for Healthcare Providers: SeriousBroker.it  This test is not yet approved or cleared by the [place] FDA and has been authorized for detection and/or diagnosis of SARS-CoV-2 by FDA under an Emergency Use Authorization (EUA). This EUA will remain in effect (meaning this test can be used) for  the duration of the COVID-19 declaration under Section 564(b)(1) of the Act, 21 U.S.C. section 360bbb-3(b)(1), unless the authorization is terminated or revoked.     Resp Syncytial Virus by PCR NEGATIVE NEGATIVE Final    Comment: (NOTE) Fact Sheet for Patients: BloggerCourse.com  Fact Sheet for Healthcare Providers: SeriousBroker.it  This test is not yet approved or cleared by the [place] FDA and has been authorized for detection and/or diagnosis of SARS-CoV-2 by FDA under an Emergency Use Authorization (EUA). This EUA will remain in effect (meaning this test can be used) for the duration of the COVID-19 declaration under Section 564(b)(1) of the Act, 21 U.S.C. section 360bbb-3(b)(1), unless the authorization is terminated or revoked.  Performed at [facility], [address], [place], [place] [address]   Aerobic Culture w Gram Stain (superficial specimen)  Status: None   Collection Time: 02/08/23  4:25 PM   Specimen: Wound  Result Value Ref Range Status   Specimen Description   Final    WOUND Performed at [facility], [address], [place], [place] [address]    Special Requests   Final    BACK Performed at [facility], [address] [place], [place] [address]    Gram Stain   Final    NO WBC SEEN FEW GRAM POSITIVE COCCI IN PAIRS Performed at [facility], [address] [place], [place] [address]    Culture MODERATE STAPHYLOCOCCUS AUREUS  Final   Report Status 02/11/2023 FINAL  Final   Organism ID, Bacteria STAPHYLOCOCCUS AUREUS  Final      Susceptibility   Staphylococcus aureus - MIC*    CIPROFLOXACIN <=0.5 SENSITIVE Sensitive     ERYTHROMYCIN >=8 RESISTANT Resistant     GENTAMICIN <=0.5 SENSITIVE Sensitive     OXACILLIN 0.5 SENSITIVE Sensitive     TETRACYCLINE <=1 SENSITIVE Sensitive     VANCOMYCIN 1 SENSITIVE Sensitive     TRIMETH/SULFA <=10 SENSITIVE Sensitive      CLINDAMYCIN <=0.25 SENSITIVE Sensitive     RIFAMPIN <=0.5 SENSITIVE Sensitive     Inducible Clindamycin NEGATIVE Sensitive     * MODERATE STAPHYLOCOCCUS AUREUS  Culture, blood (Routine X 2) w Reflex to ID Panel     Status: None (Preliminary result)   Collection Time: 02/09/23  1:19 PM   Specimen: BLOOD  Result Value Ref Range Status   Specimen Description BLOOD RIGHT [facility]  Final   Special Requests   Final    BOTTLES DRAWN AEROBIC AND ANAEROBIC Blood Culture adequate volume   Culture   Final    NO GROWTH 3 DAYS Performed at [facility], [address], [place], [place] [address]    Report Status PENDING  Incomplete  Culture, blood (Routine X 2) w Reflex to ID Panel     Status: None (Preliminary result)   Collection Time: 02/09/23  1:19 PM   Specimen: BLOOD  Result Value Ref Range Status   Specimen Description BLOOD LEFT [facility]  Final   Special Requests   Final    BOTTLES DRAWN AEROBIC AND ANAEROBIC Blood Culture adequate volume   Culture   Final    NO GROWTH 3 DAYS Performed at [facility], [address] [place], [place] [address]    Report Status PENDING  Incomplete      Radiology Studies last 3 days: [place] EKG SITE RITE  Result Date: 02/11/2023 If Site Rite image not attached, placement could not be confirmed due to current cardiac rhythm.  ECHOCARDIOGRAM COMPLETE  Result Date: 02/09/2023    ECHOCARDIOGRAM REPORT   Patient Name:   Brett Bender Date of Exam: 02/09/2023 Medical Rec #:  [id number]      Height:       76.0 in Accession #:    [id number]     Weight:       205.9 lb Date of Birth:  1946-01-05     BSA:          2.243 m Patient Age:    76 years       BP:           121/69 mmHg Patient Gender: M              HR:           65 bpm. Exam Location:  [facility] Procedure:  2D Echo, Cardiac Doppler and Color Doppler Indications:     Bacteremia  History:         Patient has no prior history of Echocardiogram examinations.                  Signs/Symptoms:Bacteremia; Risk  Factors:Hypertension.  Sonographer:     Mikki Harbor Referring Phys:  [id number] Sondra Barges Diagnosing Phys: Debbe Odea MD  Sonographer Comments: Image acquisition challenging due to respiratory motion. IMPRESSIONS  1. Left ventricular ejection fraction, by estimation, is 60 to 65%. The left ventricle has normal function. The left ventricle has no regional wall motion abnormalities. Left ventricular diastolic parameters are consistent with Grade I diastolic dysfunction (impaired relaxation).  2. Right ventricular systolic function is normal. The right ventricular size is normal. There is moderately elevated pulmonary artery systolic pressure.  3. The mitral valve is normal in structure. Trivial mitral valve regurgitation.  4. The aortic valve was not well visualized. Aortic valve regurgitation is trivial.  5. Aortic dilatation noted. There is mild dilatation of the aortic root, measuring 40 mm. FINDINGS  Left Ventricle: Left ventricular ejection fraction, by estimation, is 60 to 65%. The left ventricle has normal function. The left ventricle has no regional wall motion abnormalities. The left ventricular internal cavity size was normal in size. There is  no left ventricular hypertrophy. Left ventricular diastolic parameters are consistent with Grade I diastolic dysfunction (impaired relaxation). Right Ventricle: The right ventricular size is normal. No increase in right ventricular wall thickness. Right ventricular systolic function is normal. There is moderately elevated pulmonary artery systolic pressure. The tricuspid regurgitant velocity is 3.29 m/s, and with an assumed right atrial pressure of 8 mmHg, the estimated right ventricular systolic pressure is 51.3 mmHg. Left Atrium: Left atrial size was normal in size. Right Atrium: Right atrial size was normal in size. Pericardium: There is no evidence of pericardial effusion. Mitral Valve: The mitral valve is normal in structure. Trivial mitral valve  regurgitation. MV peak gradient, 4.6 mmHg. The mean mitral valve gradient is 1.0 mmHg. Tricuspid Valve: The tricuspid valve is normal in structure. Tricuspid valve regurgitation is mild. Aortic Valve: The aortic valve was not well visualized. Aortic valve regurgitation is trivial. Aortic valve mean gradient measures 7.0 mmHg. Aortic valve peak gradient measures 13.1 mmHg. Aortic valve area, by VTI measures 2.52 cm. Pulmonic Valve: The pulmonic valve was not well visualized. Pulmonic valve regurgitation is not visualized. Aorta: Aortic dilatation noted. There is mild dilatation of the aortic root, measuring 40 mm. Venous: The inferior vena cava was not well visualized. IAS/Shunts: No atrial level shunt detected by color flow Doppler.  LEFT VENTRICLE PLAX 2D LVIDd:         5.10 cm   Diastology LVIDs:         3.20 cm   LV e' medial:    7.29 cm/s LV PW:         1.10 cm   LV E/e' medial:  9.4 LV IVS:        1.20 cm   LV e' lateral:   11.50 cm/s LVOT diam:     1.90 cm   LV E/e' lateral: 6.0 LV SV:         99 LV SV Index:   44 LVOT Area:     2.84 cm  RIGHT VENTRICLE RV Basal diam:  3.75 cm RV Mid diam:    3.00 cm RV S prime:     16.80 cm/s TAPSE (M-mode):  2.9 cm LEFT ATRIUM             Index        RIGHT ATRIUM           Index LA diam:        4.50 cm 2.01 cm/m   RA Area:     20.20 cm LA Vol (A2C):   84.6 ml 37.72 ml/m  RA Volume:   57.00 ml  25.41 ml/m LA Vol (A4C):   53.5 ml 23.85 ml/m LA Biplane Vol: 71.3 ml 31.79 ml/m  AORTIC VALVE                     PULMONIC VALVE AV Area (Vmax):    2.48 cm      PV Vmax:       1.03 m/s AV Area (Vmean):   2.25 cm      PV Peak grad:  4.2 mmHg AV Area (VTI):     2.52 cm AV Vmax:           181.00 cm/s AV Vmean:          125.000 cm/s AV VTI:            0.392 m AV Peak Grad:      13.1 mmHg AV Mean Grad:      7.0 mmHg LVOT Vmax:         158.00 cm/s LVOT Vmean:        99.100 cm/s LVOT VTI:          0.348 m LVOT/AV VTI ratio: 0.89  AORTA Ao Root diam: 4.00 cm Ao Asc diam:  3.60 cm  MITRAL VALVE               TRICUSPID VALVE MV Area (PHT): 3.16 cm    TR Peak grad:   43.3 mmHg MV Area VTI:   3.08 cm    TR Vmax:        329.00 cm/s MV Peak grad:  4.6 mmHg MV Mean grad:  1.0 mmHg    SHUNTS MV Vmax:       1.07 m/s    Systemic VTI:  0.35 m MV Vmean:      52.1 cm/s   Systemic Diam: 1.90 cm MV Decel Time: 240 msec MV E velocity: 68.70 cm/s MV A velocity: 99.00 cm/s MV E/A ratio:  0.69 Debbe OdeaBrian Agbor-Etang MD Electronically signed by Debbe OdeaBrian Agbor-Etang MD Signature Date/Time: 02/09/2023/1:38:38 PM    Final              LOS: 4 days       Sunnie NielsenNatalie Shekera Beavers, DO [facility] 02/12/2023, 1:07 PM    Dictation software may have been used to generate the above note. Typos may occur and escape review in typed/dictated notes. Please contact Dr Lyn HollingsheadAlexander directly for clarity if needed.  Staff may message me via secure chat in Epic  but this may not receive an immediate response,  please page me for urgent matters!  If 7PM-7AM, please contact night coverage [email or web address]

## 2023-02-12 NOTE — Progress Notes (Signed)
Mobility Specialist - Progress Note   02/12/23 1412  Mobility  Activity Ambulated with assistance in room  Level of Assistance Standby assist, set-up cues, supervision of patient - no hands on  Assistive Device Front wheel walker  Distance Ambulated (ft) 10 ft  Activity Response Tolerated well  Mobility Referral Yes  $Mobility charge 1 Mobility   Pt in bathroom on RA upon arrival. Pt STS with MinA and ambulates to recliner SBA with no LOB noted. Pt complains of back pain and asks for pain cream. RN notified. Pt left in recliner with needs in reach.   Terrilyn Saver  Mobility Specialist  02/12/23 2:13 PM

## 2023-02-13 DIAGNOSIS — T40601A Poisoning by unspecified narcotics, accidental (unintentional), initial encounter: Secondary | ICD-10-CM | POA: Diagnosis not present

## 2023-02-13 DIAGNOSIS — R109 Unspecified abdominal pain: Secondary | ICD-10-CM | POA: Diagnosis not present

## 2023-02-13 DIAGNOSIS — A419 Sepsis, unspecified organism: Secondary | ICD-10-CM | POA: Diagnosis not present

## 2023-02-13 DIAGNOSIS — K760 Fatty (change of) liver, not elsewhere classified: Secondary | ICD-10-CM | POA: Diagnosis not present

## 2023-02-13 DIAGNOSIS — M5417 Radiculopathy, lumbosacral region: Secondary | ICD-10-CM | POA: Diagnosis not present

## 2023-02-13 DIAGNOSIS — F32A Depression, unspecified: Secondary | ICD-10-CM | POA: Diagnosis not present

## 2023-02-13 DIAGNOSIS — Z85828 Personal history of other malignant neoplasm of skin: Secondary | ICD-10-CM | POA: Diagnosis not present

## 2023-02-13 DIAGNOSIS — G9341 Metabolic encephalopathy: Secondary | ICD-10-CM | POA: Diagnosis not present

## 2023-02-13 DIAGNOSIS — A4101 Sepsis due to Methicillin susceptible Staphylococcus aureus: Secondary | ICD-10-CM | POA: Diagnosis not present

## 2023-02-13 DIAGNOSIS — T40604D Poisoning by unspecified narcotics, undetermined, subsequent encounter: Secondary | ICD-10-CM | POA: Diagnosis not present

## 2023-02-13 DIAGNOSIS — Z7982 Long term (current) use of aspirin: Secondary | ICD-10-CM | POA: Diagnosis not present

## 2023-02-13 DIAGNOSIS — F419 Anxiety disorder, unspecified: Secondary | ICD-10-CM | POA: Diagnosis not present

## 2023-02-13 DIAGNOSIS — M48061 Spinal stenosis, lumbar region without neurogenic claudication: Secondary | ICD-10-CM | POA: Diagnosis not present

## 2023-02-13 DIAGNOSIS — N400 Enlarged prostate without lower urinary tract symptoms: Secondary | ICD-10-CM | POA: Diagnosis not present

## 2023-02-13 DIAGNOSIS — I700 Atherosclerosis of aorta: Secondary | ICD-10-CM | POA: Diagnosis not present

## 2023-02-13 DIAGNOSIS — G43909 Migraine, unspecified, not intractable, without status migrainosus: Secondary | ICD-10-CM | POA: Diagnosis not present

## 2023-02-13 DIAGNOSIS — N401 Enlarged prostate with lower urinary tract symptoms: Secondary | ICD-10-CM | POA: Diagnosis not present

## 2023-02-13 DIAGNOSIS — Z452 Encounter for adjustment and management of vascular access device: Secondary | ICD-10-CM | POA: Diagnosis not present

## 2023-02-13 DIAGNOSIS — Z792 Long term (current) use of antibiotics: Secondary | ICD-10-CM | POA: Diagnosis not present

## 2023-02-13 DIAGNOSIS — I10 Essential (primary) hypertension: Secondary | ICD-10-CM | POA: Diagnosis not present

## 2023-02-13 MED ORDER — ALUM & MAG HYDROXIDE-SIMETH 200-200-20 MG/5ML PO SUSP: 30.0000 mL | Freq: Four times a day (QID) | ORAL | 0 refills | Status: DC | PRN

## 2023-02-13 MED ORDER — CEFAZOLIN IV (FOR PTA / DISCHARGE USE ONLY)
6.0000 g | INTRAVENOUS | 0 refills | Status: DC
Start: 2023-02-13 — End: 2023-03-08

## 2023-02-13 NOTE — Discharge Summary (Signed)
Physician Discharge Summary   Patient: Brett Bender MRN: [id number]  DOB: [birth year]   Admit:     Date of Admission: 02/07/2023 Admitted from: home   Discharge: Date of discharge: 02/13/23 Disposition: Home health Condition at discharge: good  CODE STATUS: FULL CODE      Discharge Physician: Sunnie Nielsen, DO [facility]     PCP: Malva Limes, MD  Recommendations for Outpatient Follow-up:  Follow up with PCP Malva Limes, MD in 1-2 weeks Follow up with cardiology for TEE then follow up with ID to finalize abx duration Please obtain labs/tests: TEE with cardiology  in ASAP Please follow up on the following pending results: none PCP AND OTHER OUTPATIENT PROVIDERS: SEE BELOW FOR SPECIFIC DISCHARGE INSTRUCTIONS PRINTED FOR PATIENT IN ADDITION TO GENERIC AVS PATIENT INFO    Discharge Instructions     [facility] pharmacist to adjust dose for Vancomycin, Aminoglycosides and other anti-infective therapies as requested by physician.   Complete by: As directed    [facility] to provide Cath Flo 2mg    Complete by: As directed    Administer for PICC line occlusion and as ordered by physician for other access device issues.   Anaphylaxis Kit: Provided to treat any anaphylactic reaction to the medication being provided to the patient if First Dose or when requested by physician   Complete by: As directed    Epinephrine 1mg /ml vial / amp: Administer 0.3mg  (0.77ml) subcutaneously once for moderate to severe anaphylaxis, nurse to call physician and pharmacy when reaction occurs and call 911 if needed for immediate care   Diphenhydramine 50mg /ml IV vial: Administer 25-50mg  IV/IM PRN for first dose reaction, rash, itching, mild reaction, nurse to call physician and pharmacy when reaction occurs   Sodium Chloride 0.9% NS IV: Administer if needed for hypovolemic blood pressure drop or as ordered by physician after call to physician with  anaphylactic reaction   Change dressing on IV access line weekly and PRN   Complete by: As directed    Diet - low sodium heart healthy   Complete by: As directed    Flush IV access with Sodium Chloride 0.9% and Heparin 10 units/ml or 100 units/ml   Complete by: As directed    Home infusion instructions - [facility]   Complete by: As directed    Instructions: Flush IV access with Sodium Chloride 0.9% and Heparin 10units/ml or 100units/ml   Change dressing on IV access line: Weekly and PRN   Instructions Cath Flo 2mg : Administer for PICC Line occlusion and as ordered by physician for other access device   [facility] pharmacist to adjust dose for: Vancomycin, Aminoglycosides and other anti-infective therapies as requested by physician   Increase activity slowly   Complete by: As directed    Method of administration may be changed at the discretion of home infusion pharmacist based upon assessment of the patient and/or caregiver's ability to self-administer the medication ordered   Complete by: As directed    No wound care   Complete by: As directed          Discharge Diagnoses: Principal Problem:   Sepsis Active Problems:   Right flank pain   Overdose opiate, accidental or unintentional, initial encounter   Essential (primary) hypertension   Lumbosacral neuritis   BPH (benign prostatic hyperplasia)   History of nephrolithiasis   Anxiety   AKI (acute kidney injury)   Acute metabolic encephalopathy   MSSA bacteremia  Hospital Course: SAMIEL PEEL is a 77 y.o. male with a history of renal stones, BCC, BPH, DDD presents with latered mental status and concern for accidental over dose of pain meds and muscle relaxants. EMS gave him narcan. Pt has had lower back pain and rt sided pain for a few days. He had seen his PCP the day before presentation 04/01: In the ED vitals were BP of 131/65, temperature of 98.6, heart rate 63 and pulse ox 98%. WBC was  22.5, Hb 14.2, platelet 156 and creatinine 1.66.  Blood cultures were sent. CT head did not reveal any acute findings. CT angio of the chest showed no intrathoracic pathology, no PE, MRI of the lumbar spine did not reveal any discitis or osteomyelitis or abscess.Marland Kitchen  UA was 0-5 WBC. CT scan of the abdomen in the past had revealed multiple nonobstructive small stones in the right kidney and left.  Patient was started on broad-spectrum antibiotics.  04/02: Blood cultures showed Staph aureus. ID consulted. Even though there were 2 culture sent it look like it was taken in the same site. Patient has multiple wounds on his body from nervous picking of skin. The one on the upper back was sore and he was given some topical antibiotic cream by by his dermatologist.  He also has tears on his lower extremity which is healing. Started on cefazolin.  04/03: Cr improving, WBC improving. TTE done. Cardiology consulted for TEE   04/04: no concerning findings on TTE, TEE pending. ID following.  04/05: TEE aborted d/t respiratory compromise, plan for this outpatient w/ anesthesia. PICC placed. Outpatient IV Abx plan for home - pt/wife unable to manage IV abx, daughter can help, home health can see him and educate daughter AM 04/07. Plan for discharge early morning Sunday 04/07 if he remains stable.  04/06: stable, ambulating  04/07: home health arranged, home abx plan in place. Stable for discharge.   Consultants:  Infectious disease  Cardiology  Procedures: Transesophageal Echocardiogram 02/11/23 unable to tolerate, will need anesthesia       ASSESSMENT & PLAN:   Sepsis d/t bacteremia Staph aureus bacteremia  Bacteremic source potentially skin wounds from nervous picking at skin Sepsis criteria include fever, leukocytosis, lactic acidosis and elevated procalcitonin MRI of the lumbar spine is negative for discitis or osteomyelitis as he has degenerative disc disease Cefazolin started 04/02 to continue pending  outpatient TEE Wound culture also (+)staph aureus  Plan for TEE outpatient to determine total length IV abx    Altered mental status d/t acute toxic/metabolic encephalopathy from sepsis + opioids - resolved Avoid high dose opiates and other sedating medications  Patient counseled on taking medications only as prescribed to him   Right flank pain Suspect musculoskeletal MRI lumbosacral shows multiple level degenerative changes lumbar spine with history of lumbar neuritis CT renal stone study showing renal calculi but no ureteral stones, no hydronephrosis Can trial NSAIDs with close monitoring of renal function.   cautious opiates    Overdose opiate, accidental or unintentional, initial encounter Acute metabolic encephalopathy Mental status improved with Narcan on the scene by EMS UDS --> (+)opiates no other illicits/rx   AKI (acute kidney injury) - resolved  Suspecting acute with creatinine 1.66, most recent baseline a year ago was 1.2 Fluids IV -->  po  Monitor BMP outpatient    Anxiety Remeron nightly Holding alprazolam   History of nephrolithiasis Multiple nonobstructing renal calculi with no hydronephrosis Follow outpatient vs repeat imaging as needed   BPH (  benign prostatic hyperplasia) Continue tamsulosin and finasteride Follow w/ urology outpatient    Lumbosacral neuritis History of lumbosacral decompressive surgery Multimodal pain control with muscle relaxants, heating pad, gabapentin occasional NSAID until more awake.   Will avoid narcotics in the setting of accidental overdose   Essential (primary) hypertension Patient is normotensive Given sepsis, held home Tenoretic and amlodipine initially, BP had been normotensive but then creeping up to high, will resume            Discharge Instructions  Allergies as of 02/13/2023       Reactions   Penicillins Other (See Comments)   Stomach Ache        Medication List     STOP taking these medications     atenolol-chlorthalidone 50-25 MG tablet Commonly known as: TENORETIC   fluorouracil 5 % cream Commonly known as: EFUDEX       TAKE these medications    alum & mag hydroxide-simeth 200-200-20 MG/5ML suspension Commonly known as: MAALOX/MYLANTA Take 30 mLs by mouth every 6 (six) hours as needed for indigestion or heartburn.   amLODipine 10 MG tablet Commonly known as: NORVASC TAKE 1 TABLET BY MOUTH ONCE A DAY   aspirin 81 MG chewable tablet Chew by mouth daily.   ceFAZolin  IVPB Commonly known as: ANCEF Inject 6 g into the vein continuous for 23 days. Infuse cefazolin 6gm daily over 24h as continuous infusion Indication:  MSSA bacteremia First Dose: Yes Last Day of Therapy:  03/08/2023 Labs - Once weekly:  CBC/D and CMP Please pull PIC at completion of IV antibiotics Fax weekly lab results  promptly to [phone] Method of administration: elastomeric Method of administration may be changed at the discretion of home infusion pharmacist based upon assessment of the patient and/or caregiver's ability to self-administer the medication ordered.   diclofenac Sodium 1 % Gel Commonly known as: VOLTAREN APPLY 4G TOPICALLY FIVE TIMES DAILY   DIGESTIVE ADVANTAGE PO Take 1 tablet by mouth daily.   fexofenadine 180 MG tablet Commonly known as: Allegra Allergy Take 1 tablet (180 mg total) by mouth daily.   finasteride 5 MG tablet Commonly known as: PROSCAR Take 1 tablet (5 mg total) by mouth daily.   ketoconazole 2 % shampoo Commonly known as: NIZORAL Apply 1 Application topically 3 (three) times a week. Wash scalp 3 times a week, let sit 5 minutes and rinse out   latanoprost 0.005 % ophthalmic solution Commonly known as: XALATAN Place 1 drop into both eyes at bedtime.   mirtazapine 30 MG tablet Commonly known as: REMERON TAKE 1 TABLET BY MOUTH EVERY NIGHT AT BEDTIME   mometasone 0.1 % cream Commonly known as: ELOCON Apply 1 application. topically daily as needed  (Rash). Qd up to 5 days a week to itchy bumps on body   multivitamin tablet Take 1 tablet by mouth daily.   mupirocin ointment 2 % Commonly known as: BACTROBAN Apply 1 application. topically daily. Qd to open sores   psyllium 58.6 % powder Commonly known as: METAMUCIL Take 1 packet by mouth daily as needed (regularity).   tamsulosin 0.4 MG Caps capsule Commonly known as: FLOMAX Take 1 capsule (0.4 mg total) by mouth daily.   timolol 0.5 % ophthalmic solution Commonly known as: TIMOPTIC Place 1 drop into both eyes daily.   tiZANidine 4 MG tablet Commonly known as: ZANAFLEX Take 1 tablet (4 mg total) by mouth every 8 (eight) hours as needed for muscle spasms.  Discharge Care Instructions  (From admission, onward)           Start     Ordered   02/13/23 0000  Change dressing on IV access line weekly and PRN  (Home infusion instructions - [facility] )        02/13/23 0718             Follow-up Information     Sherrie Mustache Demetrios Isaacs, MD. Schedule an appointment as soon as possible for a visit.   Specialty: Family Medicine Contact information: [address] [place] [address] [phone]         Debbe Odea, MD. Schedule an appointment as soon as possible for a visit.   Specialties: Cardiology, Radiology Why: TEE outpatient to determine duration of IV antibiotics Contact information: [address] [place] [address] [phone]                 Allergies  Allergen Reactions   Penicillins Other (See Comments)    Stomach Ache     Subjective: Pt reports lower back pain about the same as usual, relieved some this morning w/ ibuprofen. Urinating ok but needs to stand up to void.    Discharge Exam: BP (!) 149/82 (BP Location: Left Arm)   Pulse 78   Temp 98.2 F (36.8 C)   Resp 18   Ht 6\' 4"  (1.93 m)   Wt 93.4 kg   SpO2 94%   BMI 25.06 kg/m  General: Pt is alert, awake, not in  acute distress Cardiovascular: RRR, S1/S2 +, no rubs, no gallops Respiratory: CTA bilaterally, no wheezing, no rhonchi Abdominal: Soft, NT, ND, bowel sounds + Extremities: no edema, no cyanosis     The results of significant diagnostics from this hospitalization (including imaging, microbiology, ancillary and laboratory) are listed below for reference.     Microbiology: Recent Results (from the past 240 hour(s))  Urine Culture     Status: None   Collection Time: 02/07/23 12:00 AM   Specimen: Urine   Urine  Result Value Ref Range Status   Urine Culture, Routine Final report  Final   Organism ID, Bacteria No growth  Final  Urine Culture (for pregnant, neutropenic or urologic patients or patients with an indwelling urinary catheter)     Status: Abnormal   Collection Time: 02/07/23 12:19 PM   Specimen: Urine, Clean Catch  Result Value Ref Range Status   Specimen Description   Final    URINE, CLEAN CATCH Performed at [facility], [address], [place], [place] [address]    Special Requests   Final    NONE Performed at [facility], [address] [place], [place] [address]    Culture (A)  Final    <10,000 COLONIES/mL INSIGNIFICANT GROWTH Performed at [facility], [address] [place], [place] [address]    Report Status 02/09/2023 FINAL  Final  Blood Culture (routine x 2)     Status: Abnormal   Collection Time: 02/07/23 11:37 PM   Specimen: BLOOD  Result Value Ref Range Status   Specimen Description   Final    BLOOD  LEFT FOREARM Performed at [facility], [address] [place], [place] [address]    Special Requests   Final    BOTTLES DRAWN AEROBIC AND ANAEROBIC Blood Culture adequate volume Performed at [facility], [address] [place], [place] [address]    Culture  Setup Time   Final  GRAM POSITIVE COCCI IN BOTH AEROBIC AND ANAEROBIC BOTTLES CRITICAL RESULT CALLED TO, READ BACK BY AND VERIFIED WITH: Jobe Marker 02/08/23 1153 MW Performed at [facility], [address] [place], [place] [address]    Culture (A)  Final    STAPHYLOCOCCUS AUREUS SUSCEPTIBILITIES PERFORMED ON PREVIOUS CULTURE WITHIN THE LAST 5 DAYS. Performed at [facility], [address] [place], [place] [address]    Report Status 02/10/2023 FINAL  Final  Blood Culture (routine x 2)     Status: Abnormal   Collection Time: 02/07/23 11:37 PM   Specimen: BLOOD  Result Value Ref Range Status   Specimen Description   Final    BLOOD  LEFT AC Performed at [facility], [address], [place], [place] [address]    Special Requests   Final    BOTTLES DRAWN AEROBIC AND ANAEROBIC Blood Culture adequate volume Performed at [facility], [address] [place], [place] [address]    Culture  Setup Time   Final    GRAM POSITIVE COCCI IN BOTH AEROBIC AND ANAEROBIC BOTTLES Organism ID to follow CRITICAL RESULT CALLED TO, READ BACK BY AND VERIFIED WITH: Jobe Marker 02/08/23 1153 MW Performed at [facility], [address] [place], [place] [address]    Culture STAPHYLOCOCCUS AUREUS (A)  Final   Report Status 02/10/2023 FINAL  Final   Organism ID, Bacteria STAPHYLOCOCCUS AUREUS  Final      Susceptibility   Staphylococcus aureus - MIC*    CIPROFLOXACIN <=0.5 SENSITIVE Sensitive     ERYTHROMYCIN >=8 RESISTANT Resistant     GENTAMICIN <=0.5 SENSITIVE Sensitive     OXACILLIN 0.5 SENSITIVE Sensitive     TETRACYCLINE <=1 SENSITIVE Sensitive     VANCOMYCIN 1 SENSITIVE Sensitive     TRIMETH/SULFA <=10 SENSITIVE Sensitive     CLINDAMYCIN <=0.25 SENSITIVE Sensitive     RIFAMPIN <=0.5 SENSITIVE Sensitive     Inducible Clindamycin NEGATIVE Sensitive     * STAPHYLOCOCCUS AUREUS  Blood Culture ID Panel (Reflexed)     Status: Abnormal   Collection Time: 02/07/23 11:37 PM  Result Value Ref Range Status   Enterococcus faecalis NOT DETECTED NOT DETECTED Final   Enterococcus Faecium NOT  DETECTED NOT DETECTED Final   Listeria monocytogenes NOT DETECTED NOT DETECTED Final   Staphylococcus species DETECTED (A) NOT DETECTED Final    Comment: CRITICAL RESULT CALLED TO, READ BACK BY AND VERIFIED WITH: ALVERA MADUME 02/08/23 1154 MW    Staphylococcus aureus (BCID) DETECTED (A) NOT DETECTED Final    Comment: CRITICAL RESULT CALLED TO, READ BACK BY AND VERIFIED WITH: ALVERA MADUME 02/08/23 1154 MW    Staphylococcus epidermidis NOT DETECTED NOT DETECTED Final   Staphylococcus lugdunensis NOT DETECTED NOT DETECTED Final   Streptococcus species NOT DETECTED NOT DETECTED Final   Streptococcus agalactiae NOT DETECTED NOT DETECTED Final   Streptococcus pneumoniae NOT DETECTED NOT DETECTED Final   Streptococcus pyogenes NOT DETECTED NOT DETECTED Final   A.calcoaceticus-baumannii NOT DETECTED NOT DETECTED Final   Bacteroides fragilis NOT DETECTED NOT DETECTED Final   Enterobacterales NOT DETECTED NOT DETECTED Final   Enterobacter cloacae complex NOT DETECTED NOT DETECTED Final   Escherichia coli NOT DETECTED NOT DETECTED Final   Klebsiella aerogenes NOT DETECTED NOT DETECTED Final   Klebsiella oxytoca NOT DETECTED NOT DETECTED Final   Klebsiella pneumoniae NOT DETECTED NOT DETECTED Final   Proteus species NOT DETECTED NOT DETECTED Final   Salmonella species NOT DETECTED NOT DETECTED Final  Serratia marcescens NOT DETECTED NOT DETECTED Final   Haemophilus influenzae NOT DETECTED NOT DETECTED Final   Neisseria meningitidis NOT DETECTED NOT DETECTED Final   Pseudomonas aeruginosa NOT DETECTED NOT DETECTED Final   Stenotrophomonas maltophilia NOT DETECTED NOT DETECTED Final   Candida albicans NOT DETECTED NOT DETECTED Final   Candida auris NOT DETECTED NOT DETECTED Final   Candida glabrata NOT DETECTED NOT DETECTED Final   Candida krusei NOT DETECTED NOT DETECTED Final   Candida parapsilosis NOT DETECTED NOT DETECTED Final   Candida tropicalis NOT DETECTED NOT DETECTED Final    Cryptococcus neoformans/gattii NOT DETECTED NOT DETECTED Final   Meth resistant mecA/C and MREJ NOT DETECTED NOT DETECTED Final    Comment: Performed at [facility], [address] [place], [place] [address]  Resp panel by RT-PCR (RSV, Flu A&B, Covid) Anterior Nasal Swab     Status: None   Collection Time: 02/08/23  4:31 AM   Specimen: Anterior Nasal Swab  Result Value Ref Range Status   SARS Coronavirus 2 by RT PCR NEGATIVE NEGATIVE Final    Comment: (NOTE) SARS-CoV-2 target nucleic acids are NOT DETECTED.  The SARS-CoV-2 RNA is generally detectable in upper respiratory specimens during the acute phase of infection. The lowest concentration of SARS-CoV-2 viral copies this assay can detect is 138 copies/mL. A negative result does not preclude SARS-Cov-2 infection and should not be used as the sole basis for treatment or other patient management decisions. A negative result may occur with  improper specimen collection/handling, submission of specimen other than nasopharyngeal swab, presence of viral mutation(s) within the areas targeted by this assay, and inadequate number of viral copies(<138 copies/mL). A negative result must be combined with clinical observations, patient history, and epidemiological information. The expected result is Negative.  Fact Sheet for Patients:  BloggerCourse.com  Fact Sheet for Healthcare Providers:  SeriousBroker.it  This test is no t yet approved or cleared by the [place] FDA and  has been authorized for detection and/or diagnosis of SARS-CoV-2 by FDA under an Emergency Use Authorization (EUA). This EUA will remain  in effect (meaning this test can be used) for the duration of the COVID-19 declaration under Section 564(b)(1) of the Act, 21 U.S.C.section 360bbb-3(b)(1), unless the authorization is terminated  or revoked sooner.       Influenza A by PCR NEGATIVE NEGATIVE Final    Influenza B by PCR NEGATIVE NEGATIVE Final    Comment: (NOTE) The Xpert Xpress SARS-CoV-2/FLU/RSV plus assay is intended as an aid in the diagnosis of influenza from Nasopharyngeal swab specimens and should not be used as a sole basis for treatment. Nasal washings and aspirates are unacceptable for Xpert Xpress SARS-CoV-2/FLU/RSV testing.  Fact Sheet for Patients: BloggerCourse.com  Fact Sheet for Healthcare Providers: SeriousBroker.it  This test is not yet approved or cleared by the [place] FDA and has been authorized for detection and/or diagnosis of SARS-CoV-2 by FDA under an Emergency Use Authorization (EUA). This EUA will remain in effect (meaning this test can be used) for the duration of the COVID-19 declaration under Section 564(b)(1) of the Act, 21 U.S.C. section 360bbb-3(b)(1), unless the authorization is terminated or revoked.     Resp Syncytial Virus by PCR NEGATIVE NEGATIVE Final    Comment: (NOTE) Fact Sheet for Patients: BloggerCourse.com  Fact Sheet for Healthcare Providers: SeriousBroker.it  This test is not yet approved or cleared by the [place] FDA and has been authorized for detection and/or diagnosis of SARS-CoV-2 by FDA under an  Emergency Use Authorization (EUA). This EUA will remain in effect (meaning this test can be used) for the duration of the COVID-19 declaration under Section 564(b)(1) of the Act, 21 U.S.C. section 360bbb-3(b)(1), unless the authorization is terminated or revoked.  Performed at [facility], [address], [place], [place] [address]   Aerobic Culture w Gram Stain (superficial specimen)     Status: None   Collection Time: 02/08/23  4:25 PM   Specimen: Wound  Result Value Ref Range Status   Specimen Description   Final    WOUND Performed at [facility], [address] [place], [place]  [address]    Special Requests   Final    BACK Performed at [facility], [address] [place], [place] [address]    Gram Stain   Final    NO WBC SEEN FEW GRAM POSITIVE COCCI IN PAIRS Performed at [facility], [address], [place], [place] [address]    Culture MODERATE STAPHYLOCOCCUS AUREUS  Final   Report Status 02/11/2023 FINAL  Final   Organism ID, Bacteria STAPHYLOCOCCUS AUREUS  Final      Susceptibility   Staphylococcus aureus - MIC*    CIPROFLOXACIN <=0.5 SENSITIVE Sensitive     ERYTHROMYCIN >=8 RESISTANT Resistant     GENTAMICIN <=0.5 SENSITIVE Sensitive     OXACILLIN 0.5 SENSITIVE Sensitive     TETRACYCLINE <=1 SENSITIVE Sensitive     VANCOMYCIN 1 SENSITIVE Sensitive     TRIMETH/SULFA <=10 SENSITIVE Sensitive     CLINDAMYCIN <=0.25 SENSITIVE Sensitive     RIFAMPIN <=0.5 SENSITIVE Sensitive     Inducible Clindamycin NEGATIVE Sensitive     * MODERATE STAPHYLOCOCCUS AUREUS  Culture, blood (Routine X 2) w Reflex to ID Panel     Status: None (Preliminary result)   Collection Time: 02/09/23  1:19 PM   Specimen: BLOOD  Result Value Ref Range Status   Specimen Description BLOOD RIGHT [facility]  Final   Special Requests   Final    BOTTLES DRAWN AEROBIC AND ANAEROBIC Blood Culture adequate volume   Culture   Final    NO GROWTH 4 DAYS Performed at [facility], [address], [place], [place] [address]    Report Status PENDING  Incomplete  Culture, blood (Routine X 2) w Reflex to ID Panel     Status: None (Preliminary result)   Collection Time: 02/09/23  1:19 PM   Specimen: BLOOD  Result Value Ref Range Status   Specimen Description BLOOD LEFT AC  Final   Special Requests   Final    BOTTLES DRAWN AEROBIC AND ANAEROBIC Blood Culture adequate volume   Culture   Final    NO GROWTH 4 DAYS Performed at [facility], [address] [place], [place] [address]    Report Status PENDING  Incomplete     Labs: BNP (last 3 results) No results  for input(s): "BNP" in the last 8760 hours. Basic Metabolic Panel: Recent Labs  Lab 02/07/23 2335 02/08/23 0638 02/09/23 0533 02/10/23 0506 02/11/23 0509  NA 135  --  135 137 140  K 3.5  --  3.4* 3.2* 3.6  CL 103  --  101 103 107  CO2 21*  --  24 24 26   GLUCOSE 207*  --  146* 120* 133*  BUN 52*  --  57* 43* 27*  CREATININE 1.66* 1.72* 1.56* 1.28* 0.96  CALCIUM 8.6*  --  8.5* 8.2* 8.4*   Liver Function Tests:  Recent Labs  Lab 02/07/23 2335 02/09/23 0533  AST 28 27  ALT 22 29  ALKPHOS 73 73  BILITOT 0.9 1.1  PROT 6.8 6.1*  ALBUMIN 3.4* 2.7*   No results for input(s): "LIPASE", "AMYLASE" in the last 168 hours. No results for input(s): "AMMONIA" in the last 168 hours. CBC: Recent Labs  Lab 02/07/23 2335 02/08/23 0638 02/09/23 0533 02/12/23 0523  WBC 22.5* 20.3* 16.4* 17.4*  NEUTROABS 18.6*  --   --  11.2*  HGB 14.2 13.2 13.6 14.2  HCT 43.5 41.3 40.8 41.1  MCV 93.8 96.5 91.9 88.0  PLT 156 141* 126* 169   Cardiac Enzymes: No results for input(s): "CKTOTAL", "CKMB", "CKMBINDEX", "TROPONINI" in the last 168 hours. BNP: Invalid input(s): "POCBNP" CBG: No results for input(s): "GLUCAP" in the last 168 hours. D-Dimer No results for input(s): "DDIMER" in the last 72 hours. Hgb A1c No results for input(s): "HGBA1C" in the last 72 hours. Lipid Profile No results for input(s): "CHOL", "HDL", "LDLCALC", "TRIG", "CHOLHDL", "LDLDIRECT" in the last 72 hours. Thyroid function studies No results for input(s): "TSH", "T4TOTAL", "T3FREE", "THYROIDAB" in the last 72 hours.  Invalid input(s): "FREET3" Anemia work up No results for input(s): "VITAMINB12", "FOLATE", "FERRITIN", "TIBC", "IRON", "RETICCTPCT" in the last 72 hours. Urinalysis    Component Value Date/Time   COLORURINE AMBER (A) 02/07/2023 2335   APPEARANCEUR HAZY (A) 02/07/2023 2335   APPEARANCEUR Clear 08/07/2021 1118   LABSPEC 1.026 02/07/2023 2335   LABSPEC 1.008 02/27/2012 1139   PHURINE 5.0 02/07/2023  2335   GLUCOSEU NEGATIVE 02/07/2023 2335   GLUCOSEU Negative 02/27/2012 1139   HGBUR NEGATIVE 02/07/2023 2335   BILIRUBINUR NEGATIVE 02/07/2023 2335   BILIRUBINUR neg 02/07/2023 0946   BILIRUBINUR Negative 08/07/2021 1118   BILIRUBINUR Negative 02/27/2012 1139   KETONESUR NEGATIVE 02/07/2023 2335   PROTEINUR 100 (A) 02/07/2023 2335   UROBILINOGEN 0.2 02/07/2023 0946   UROBILINOGEN 1.0 04/28/2010 1200   NITRITE NEGATIVE 02/07/2023 2335   LEUKOCYTESUR NEGATIVE 02/07/2023 2335   LEUKOCYTESUR Negative 02/27/2012 1139   Sepsis Labs Recent Labs  Lab 02/07/23 2335 02/08/23 0638 02/09/23 0533 02/12/23 0523  WBC 22.5* 20.3* 16.4* 17.4*   Microbiology Recent Results (from the past 240 hour(s))  Urine Culture     Status: None   Collection Time: 02/07/23 12:00 AM   Specimen: Urine   Urine  Result Value Ref Range Status   Urine Culture, Routine Final report  Final   Organism ID, Bacteria No growth  Final  Urine Culture (for pregnant, neutropenic or urologic patients or patients with an indwelling urinary catheter)     Status: Abnormal   Collection Time: 02/07/23 12:19 PM   Specimen: Urine, Clean Catch  Result Value Ref Range Status   Specimen Description   Final    URINE, CLEAN CATCH Performed at [facility], [address] [place], [place] [address]    Special Requests   Final    NONE Performed at [facility], [address] [place], [place] [address]    Culture (A)  Final    <10,000 COLONIES/mL INSIGNIFICANT GROWTH Performed at [facility], [address] [place], [place] [address]    Report Status 02/09/2023 FINAL  Final  Blood Culture (routine x 2)     Status: Abnormal   Collection Time: 02/07/23 11:37 PM   Specimen: BLOOD  Result Value Ref Range Status   Specimen Description   Final    BLOOD  LEFT FOREARM Performed at [facility], [address] [place], [place] [address]    Special Requests   Final    BOTTLES DRAWN  AEROBIC AND ANAEROBIC Blood Culture adequate volume Performed at [facility], [address] [place], [place] [address]    Culture  Setup Time   Final    GRAM POSITIVE COCCI IN BOTH AEROBIC AND ANAEROBIC BOTTLES CRITICAL RESULT CALLED TO, READ BACK BY AND VERIFIED WITH: Jobe Marker 02/08/23 1153 MW Performed at [facility], [address] [place], [place] [address]    Culture (A)  Final    STAPHYLOCOCCUS AUREUS SUSCEPTIBILITIES PERFORMED ON PREVIOUS CULTURE WITHIN THE LAST 5 DAYS. Performed at [facility], [address], [place], [place] [address]    Report Status 02/10/2023 FINAL  Final  Blood Culture (routine x 2)     Status: Abnormal   Collection Time: 02/07/23 11:37 PM   Specimen: BLOOD  Result Value Ref Range Status   Specimen Description   Final    BLOOD  LEFT AC Performed at [facility], [address] [place], [place] [address]    Special Requests   Final    BOTTLES DRAWN AEROBIC AND ANAEROBIC Blood Culture adequate volume Performed at [facility], [address] [place], [place] [address]    Culture  Setup Time   Final    GRAM POSITIVE COCCI IN BOTH AEROBIC AND ANAEROBIC BOTTLES Organism ID to follow CRITICAL RESULT CALLED TO, READ BACK BY AND VERIFIED WITH: Jobe Marker 02/08/23 1153 MW Performed at [facility], [address] [place], [place] [address]    Culture STAPHYLOCOCCUS AUREUS (A)  Final   Report Status 02/10/2023 FINAL  Final   Organism ID, Bacteria STAPHYLOCOCCUS AUREUS  Final      Susceptibility   Staphylococcus aureus - MIC*    CIPROFLOXACIN <=0.5 SENSITIVE Sensitive     ERYTHROMYCIN >=8 RESISTANT Resistant     GENTAMICIN <=0.5 SENSITIVE Sensitive     OXACILLIN 0.5 SENSITIVE Sensitive     TETRACYCLINE <=1 SENSITIVE Sensitive     VANCOMYCIN 1 SENSITIVE Sensitive     TRIMETH/SULFA <=10 SENSITIVE Sensitive     CLINDAMYCIN <=0.25 SENSITIVE Sensitive     RIFAMPIN <=0.5 SENSITIVE  Sensitive     Inducible Clindamycin NEGATIVE Sensitive     * STAPHYLOCOCCUS AUREUS  Blood Culture ID Panel (Reflexed)     Status: Abnormal   Collection Time: 02/07/23 11:37 PM  Result Value Ref Range Status   Enterococcus faecalis NOT DETECTED NOT DETECTED Final   Enterococcus Faecium NOT DETECTED NOT DETECTED Final   Listeria monocytogenes NOT DETECTED NOT DETECTED Final   Staphylococcus species DETECTED (A) NOT DETECTED Final    Comment: CRITICAL RESULT CALLED TO, READ BACK BY AND VERIFIED WITH: ALVERA MADUME 02/08/23 1154 MW    Staphylococcus aureus (BCID) DETECTED (A) NOT DETECTED Final    Comment: CRITICAL RESULT CALLED TO, READ BACK BY AND VERIFIED WITH: ALVERA MADUME 02/08/23 1154 MW    Staphylococcus epidermidis NOT DETECTED NOT DETECTED Final   Staphylococcus lugdunensis NOT DETECTED NOT DETECTED Final   Streptococcus species NOT DETECTED NOT DETECTED Final   Streptococcus agalactiae NOT DETECTED NOT DETECTED Final   Streptococcus pneumoniae NOT DETECTED NOT DETECTED Final   Streptococcus pyogenes NOT DETECTED NOT DETECTED Final   A.calcoaceticus-baumannii NOT DETECTED NOT DETECTED Final   Bacteroides fragilis NOT DETECTED NOT DETECTED Final   Enterobacterales NOT DETECTED NOT DETECTED Final   Enterobacter cloacae complex NOT DETECTED NOT DETECTED Final  Escherichia coli NOT DETECTED NOT DETECTED Final   Klebsiella aerogenes NOT DETECTED NOT DETECTED Final   Klebsiella oxytoca NOT DETECTED NOT DETECTED Final   Klebsiella pneumoniae NOT DETECTED NOT DETECTED Final   Proteus species NOT DETECTED NOT DETECTED Final   Salmonella species NOT DETECTED NOT DETECTED Final   Serratia marcescens NOT DETECTED NOT DETECTED Final   Haemophilus influenzae NOT DETECTED NOT DETECTED Final   Neisseria meningitidis NOT DETECTED NOT DETECTED Final   Pseudomonas aeruginosa NOT DETECTED NOT DETECTED Final   Stenotrophomonas maltophilia NOT DETECTED NOT DETECTED Final   Candida albicans NOT  DETECTED NOT DETECTED Final   Candida auris NOT DETECTED NOT DETECTED Final   Candida glabrata NOT DETECTED NOT DETECTED Final   Candida krusei NOT DETECTED NOT DETECTED Final   Candida parapsilosis NOT DETECTED NOT DETECTED Final   Candida tropicalis NOT DETECTED NOT DETECTED Final   Cryptococcus neoformans/gattii NOT DETECTED NOT DETECTED Final   Meth resistant mecA/C and MREJ NOT DETECTED NOT DETECTED Final    Comment: Performed at [facility], [address] [place], [place] [address]  Resp panel by RT-PCR (RSV, Flu A&B, Covid) Anterior Nasal Swab     Status: None   Collection Time: 02/08/23  4:31 AM   Specimen: Anterior Nasal Swab  Result Value Ref Range Status   SARS Coronavirus 2 by RT PCR NEGATIVE NEGATIVE Final    Comment: (NOTE) SARS-CoV-2 target nucleic acids are NOT DETECTED.  The SARS-CoV-2 RNA is generally detectable in upper respiratory specimens during the acute phase of infection. The lowest concentration of SARS-CoV-2 viral copies this assay can detect is 138 copies/mL. A negative result does not preclude SARS-Cov-2 infection and should not be used as the sole basis for treatment or other patient management decisions. A negative result may occur with  improper specimen collection/handling, submission of specimen other than nasopharyngeal swab, presence of viral mutation(s) within the areas targeted by this assay, and inadequate number of viral copies(<138 copies/mL). A negative result must be combined with clinical observations, patient history, and epidemiological information. The expected result is Negative.  Fact Sheet for Patients:  BloggerCourse.com  Fact Sheet for Healthcare Providers:  SeriousBroker.it  This test is no t yet approved or cleared by the [place] FDA and  has been authorized for detection and/or diagnosis of SARS-CoV-2 by FDA under an Emergency Use Authorization (EUA). This  EUA will remain  in effect (meaning this test can be used) for the duration of the COVID-19 declaration under Section 564(b)(1) of the Act, 21 U.S.C.section 360bbb-3(b)(1), unless the authorization is terminated  or revoked sooner.       Influenza A by PCR NEGATIVE NEGATIVE Final   Influenza B by PCR NEGATIVE NEGATIVE Final    Comment: (NOTE) The Xpert Xpress SARS-CoV-2/FLU/RSV plus assay is intended as an aid in the diagnosis of influenza from Nasopharyngeal swab specimens and should not be used as a sole basis for treatment. Nasal washings and aspirates are unacceptable for Xpert Xpress SARS-CoV-2/FLU/RSV testing.  Fact Sheet for Patients: BloggerCourse.com  Fact Sheet for Healthcare Providers: SeriousBroker.it  This test is not yet approved or cleared by the [place] FDA and has been authorized for detection and/or diagnosis of SARS-CoV-2 by FDA under an Emergency Use Authorization (EUA). This EUA will remain in effect (meaning this test can be used) for the duration of the COVID-19 declaration under Section 564(b)(1) of the Act, 21 U.S.C. section 360bbb-3(b)(1), unless the authorization is terminated or revoked.  Resp Syncytial Virus by PCR NEGATIVE NEGATIVE Final    Comment: (NOTE) Fact Sheet for Patients: BloggerCourse.com  Fact Sheet for Healthcare Providers: SeriousBroker.it  This test is not yet approved or cleared by the [place] FDA and has been authorized for detection and/or diagnosis of SARS-CoV-2 by FDA under an Emergency Use Authorization (EUA). This EUA will remain in effect (meaning this test can be used) for the duration of the COVID-19 declaration under Section 564(b)(1) of the Act, 21 U.S.C. section 360bbb-3(b)(1), unless the authorization is terminated or revoked.  Performed at [facility], [address] [place], [place]  [address]   Aerobic Culture w Gram Stain (superficial specimen)     Status: None   Collection Time: 02/08/23  4:25 PM   Specimen: Wound  Result Value Ref Range Status   Specimen Description   Final    WOUND Performed at [facility], [address], [place], [place] [address]    Special Requests   Final    BACK Performed at [facility], [address] [place], [place] [address]    Gram Stain   Final    NO WBC SEEN FEW GRAM POSITIVE COCCI IN PAIRS Performed at [facility], [address] [place], [place] [address]    Culture MODERATE STAPHYLOCOCCUS AUREUS  Final   Report Status 02/11/2023 FINAL  Final   Organism ID, Bacteria STAPHYLOCOCCUS AUREUS  Final      Susceptibility   Staphylococcus aureus - MIC*    CIPROFLOXACIN <=0.5 SENSITIVE Sensitive     ERYTHROMYCIN >=8 RESISTANT Resistant     GENTAMICIN <=0.5 SENSITIVE Sensitive     OXACILLIN 0.5 SENSITIVE Sensitive     TETRACYCLINE <=1 SENSITIVE Sensitive     VANCOMYCIN 1 SENSITIVE Sensitive     TRIMETH/SULFA <=10 SENSITIVE Sensitive     CLINDAMYCIN <=0.25 SENSITIVE Sensitive     RIFAMPIN <=0.5 SENSITIVE Sensitive     Inducible Clindamycin NEGATIVE Sensitive     * MODERATE STAPHYLOCOCCUS AUREUS  Culture, blood (Routine X 2) w Reflex to ID Panel     Status: None (Preliminary result)   Collection Time: 02/09/23  1:19 PM   Specimen: BLOOD  Result Value Ref Range Status   Specimen Description BLOOD RIGHT [facility]  Final   Special Requests   Final    BOTTLES DRAWN AEROBIC AND ANAEROBIC Blood Culture adequate volume   Culture   Final    NO GROWTH 4 DAYS Performed at [facility], [address], [place], [place] [address]    Report Status PENDING  Incomplete  Culture, blood (Routine X 2) w Reflex to ID Panel     Status: None (Preliminary result)   Collection Time: 02/09/23  1:19 PM   Specimen: BLOOD  Result Value Ref Range Status   Specimen Description BLOOD LEFT [facility]  Final   Special Requests    Final    BOTTLES DRAWN AEROBIC AND ANAEROBIC Blood Culture adequate volume   Culture   Final    NO GROWTH 4 DAYS Performed at [facility], [address] [place], [place] [address]    Report Status PENDING  Incomplete   Imaging [place] EKG SITE RITE  Result Date: 02/11/2023 If Site Rite image not attached, placement could not be confirmed due to current cardiac rhythm.     Time coordinating discharge: over 30 minutes  SIGNED:  Sunnie Nielsen DO [facility]

## 2023-02-14 ENCOUNTER — Telehealth: Payer: Self-pay | Admitting: *Deleted

## 2023-02-14 ENCOUNTER — Other Ambulatory Visit: Payer: Self-pay | Admitting: *Deleted

## 2023-02-14 DIAGNOSIS — A419 Sepsis, unspecified organism: Secondary | ICD-10-CM

## 2023-02-14 LAB — CULTURE, BLOOD (ROUTINE X 2)
Culture: NO GROWTH
Culture: NO GROWTH
Special Requests: ADEQUATE
Special Requests: ADEQUATE

## 2023-02-14 NOTE — Progress Notes (Signed)
  Care Coordination   Note   02/14/2023 Name: [name] MRN: [id number] DOB: 1946-03-25  [name] is a 77 y.o. year old male who sees Fisher, Demetrios Isaacs, MD for primary care. I reached out to [name] by phone today to offer care coordination services.  Mr. Hollan was given information about Care Coordination services today including:   The Care Coordination services include support from the care team which includes your Nurse Coordinator, Clinical Social Worker, or Pharmacist.  The Care Coordination team is here to help remove barriers to the health concerns and goals most important to you. Care Coordination services are voluntary, and the patient may decline or stop services at any time by request to their care team member.   Care Coordination Consent Status: Patient agreed to services and verbal consent obtained.   Follow up plan:  Telephone appointment with care coordination team member scheduled for:  02/18/2023  Encounter Outcome:  Pt. Scheduled from referral   Burman Nieves, [facility] Care Coordination Care Guide Direct Dial: [phone]

## 2023-02-15 ENCOUNTER — Telehealth: Payer: Self-pay | Admitting: *Deleted

## 2023-02-15 ENCOUNTER — Encounter: Payer: Self-pay | Admitting: Cardiology

## 2023-02-15 DIAGNOSIS — G9341 Metabolic encephalopathy: Secondary | ICD-10-CM | POA: Diagnosis not present

## 2023-02-15 DIAGNOSIS — F32A Depression, unspecified: Secondary | ICD-10-CM | POA: Diagnosis not present

## 2023-02-15 DIAGNOSIS — I10 Essential (primary) hypertension: Secondary | ICD-10-CM | POA: Diagnosis not present

## 2023-02-15 DIAGNOSIS — N400 Enlarged prostate without lower urinary tract symptoms: Secondary | ICD-10-CM | POA: Diagnosis not present

## 2023-02-15 DIAGNOSIS — F419 Anxiety disorder, unspecified: Secondary | ICD-10-CM | POA: Diagnosis not present

## 2023-02-15 DIAGNOSIS — A4101 Sepsis due to Methicillin susceptible Staphylococcus aureus: Secondary | ICD-10-CM | POA: Diagnosis not present

## 2023-02-15 DIAGNOSIS — A419 Sepsis, unspecified organism: Secondary | ICD-10-CM | POA: Diagnosis not present

## 2023-02-15 NOTE — Transitions of Care (Post Inpatient/ED Visit) (Signed)
   02/15/2023  Name: Brett Bender MRN: [id number] DOB: [birth year]  Today's TOC FU Call Status: Today's TOC FU Call Status:: Unsuccessul Call (1st Attempt) Unsuccessful Call (1st Attempt) Date: 02/15/23  Attempted to reach the patient regarding the most recent Inpatient/ED visit.  Follow Up Plan: Additional outreach attempts will be made to reach the patient to complete the Transitions of Care (Post Inpatient/ED visit) call.  Gean Maidens BSN RN [facility] [phone]

## 2023-02-16 ENCOUNTER — Telehealth: Payer: Self-pay

## 2023-02-16 ENCOUNTER — Telehealth: Payer: Self-pay | Admitting: *Deleted

## 2023-02-16 NOTE — Telephone Encounter (Signed)
That's fine

## 2023-02-16 NOTE — Telephone Encounter (Signed)
Copied from CRM [id number]. Topic: General - Other >> Feb 16, 2023 10:03 AM Frutoso Chase wrote: Reason for CRM: Home Health Verbal Orders - Caller/Agency: Selinda Eon  Callback Number: [phone]  Requesting OT/PT/Skilled Nursing/Social Work/Speech Therapy: Physical therapy  Frequency: 2 week 2 and then 1 week 6.

## 2023-02-16 NOTE — Transitions of Care (Post Inpatient/ED Visit) (Signed)
   02/16/2023  Name: Brett Bender MRN: [id number] DOB: 02-Dec-1945  Today's TOC FU Call Status: Today's TOC FU Call Status:: Successful TOC FU Call Competed TOC FU Call Complete Date: 02/16/23  Transition Care Management Follow-up Telephone Call Date of Discharge: 02/13/23 Discharge Facility: [facility]) Primary Inpatient Discharge Diagnosis:: sepsis How have you been since you were released from the hospital?: Better Any questions or concerns?: No  Items Reviewed: Did you receive and understand the discharge instructions provided?: No Medications obtained and verified?: Yes (Medications Reviewed) Any new allergies since your discharge?: No Dietary orders reviewed?: No Do you have support at home?: Yes People in Home: spouse Name of Support/Comfort Primary Source: [facility] and Equipment/Supplies: Were Home Health Services Ordered?: Yes ([facility], [facility], OT, SP) Name of [facility] Agency[facility] Has Agency set up a time to come to your home?: Yes First Home Health Visit Date: 02/15/23 Any new equipment or medical supplies ordered?: Yes Name of Medical supply agency?: [facility] Were you able to get the equipment/medical supplies?: Yes  Functional Questionnaire: Do you need assistance with bathing/showering or dressing?: Yes Do you need assistance with meal preparation?: Yes Do you need assistance with eating?: No Do you have difficulty maintaining continence: No Do you need assistance with getting out of bed/getting out of a chair/moving?: No Do you have difficulty managing or taking your medications?: Yes  Follow up appointments reviewed: PCP Follow-up appointment confirmed?: Yes Date of PCP follow-up appointment?: 02/23/23 Follow-up Provider: Merita Norton [address] [facility] [facility] Follow-up appointment confirmed?: Yes Date of Specialist follow-up appointment?: 03/01/23 Follow-Up Specialty Provider::  Infectious disease Do you need transportation to your follow-up appointment?: No Do you understand care options if your condition(s) worsen?: Yes-patient verbalized understanding  SDOH Interventions Today    Flowsheet Row Most Recent Value  SDOH Interventions   Food Insecurity Interventions Intervention Not Indicated  Housing Interventions Intervention Not Indicated  Transportation Interventions Intervention Not Indicated      Interventions Today    Flowsheet Row Most Recent Value  General Interventions   General Interventions Discussed/Reviewed General Interventions Discussed, General Interventions Reviewed, Doctor Visits  Doctor Visits Discussed/Reviewed Doctor Visits Discussed, Doctor Visits Reviewed      [facility] Inter[facility] Today    Flowsheet Row Most Recent Value  TOC Interventions   TOC Interventions Discussed/Reviewed TOC Interventions Discussed, TOC Interventions Reviewed, Arranged PCP follow up less than 12 days/Care Guide scheduled       Gean Maidens BSN RN [facility] [phone]

## 2023-02-16 NOTE — Telephone Encounter (Signed)
Chris advised.  

## 2023-02-17 ENCOUNTER — Other Ambulatory Visit: Payer: Self-pay | Admitting: Family Medicine

## 2023-02-17 DIAGNOSIS — M62830 Muscle spasm of back: Secondary | ICD-10-CM

## 2023-02-18 ENCOUNTER — Ambulatory Visit: Payer: Self-pay | Admitting: *Deleted

## 2023-02-18 ENCOUNTER — Telehealth (INDEPENDENT_AMBULATORY_CARE_PROVIDER_SITE_OTHER): Payer: Medicare Other | Admitting: Family Medicine

## 2023-02-18 ENCOUNTER — Encounter: Payer: Self-pay | Admitting: Family Medicine

## 2023-02-18 DIAGNOSIS — M5416 Radiculopathy, lumbar region: Secondary | ICD-10-CM

## 2023-02-18 DIAGNOSIS — T40601A Poisoning by unspecified narcotics, accidental (unintentional), initial encounter: Secondary | ICD-10-CM | POA: Diagnosis not present

## 2023-02-18 MED ORDER — PREDNISONE 20 MG PO TABS: 20.0000 mg | ORAL_TABLET | Freq: Two times a day (BID) | ORAL | 0 refills | Status: AC

## 2023-02-18 MED ORDER — TRAMADOL HCL 50 MG PO TABS: 50.0000 mg | ORAL_TABLET | Freq: Three times a day (TID) | ORAL | 0 refills | Status: DC | PRN

## 2023-02-18 NOTE — Telephone Encounter (Signed)
  Chief Complaint: medication request- back pain Symptoms: recent treatment for chronic back pain- patient was admitted to hospital for sepsis and is home now on IV antibiotic. Patient states the muscle relaxer is not helping pain- is requesting pain medication from PCP. Patient does have appointment next week with Ortho provider.  Frequency: chronic back pain Pertinent Negatives: Patient denies fever, abdomen pain, burning with urination, blood in urine Disposition: [] ED /[] Urgent Care (no appt availability in office) / [x] Appointment(In office/virtual)/ []  [facility]/ [] Home Care/ [] Refused Recommended Disposition /[] [facility]/ []  Follow-up with PCP Additional Notes: Call to office- patient has requested a virtual appointment with provider since he is having IV treatmant at home. Okey Regal assisted and patient has been scheduled.

## 2023-02-18 NOTE — Patient Outreach (Signed)
  Care Coordination   02/18/2023 Name: BEARETT WACH MRN: [id number] DOB: [birth year]   Care Coordination Outreach Attempts:  An unsuccessful telephone outreach was attempted for a scheduled appointment today.  Follow Up Plan:  Additional outreach attempts will be made to offer the patient care coordination information and services.   Encounter Outcome:  No Answer   Care Coordination Interventions:  No, not indicated    Kemper Durie, RN, MSN, [facility] Care Management Care Management Coordinator [phone]

## 2023-02-18 NOTE — Progress Notes (Signed)
MyChart Video Visit    Virtual Visit via Video Note   This format is felt to be most appropriate for this patient at this time. Physical exam was limited by quality of the video and audio technology used for the visit.   Patient location: home Provider location: [facility]  I discussed the limitations of evaluation and management by telemedicine and the availability of in person appointments. The patient expressed understanding and agreed to proceed.  Patient: Brett Bender   DOB: [birth year]   77 y.o. Male  MRN: [id number] Visit Date: 02/18/2023  Today's healthcare provider: [name], MD    Subjective    HPI Was hospitalized 02/07/2023 through 02/07/2023 for accidental opioid overdose after being found down at home after taking old hydrocodone prescription for worsening back pain. He had elevated WBC and drop in eGFR upon admission. Blood cultures were positive for staph aureus, as was culture from a skin wound. He was treated with IV antibiotic and sent home with PICC line for long term IV antibiotic.   Since then he has continued to have severe back pain, has been taking tizanidine three times a day and diclofenac 50mg  twice a day with very little pain relief. He relates pain as being 10/10 and still  limiting his activity.    Medications: No facility-administered medications prior to visit.   Outpatient Medications Prior to Visit  Medication Sig   amLODipine (NORVASC) 10 MG tablet TAKE 1 TABLET BY MOUTH ONCE A DAY   aspirin 81 MG chewable tablet Chew by mouth daily.   ceFAZolin (ANCEF) IVPB Inject 6 g into the vein continuous for 23 days. Infuse cefazolin 6gm daily over 24h as continuous infusion Indication:  MSSA bacteremia First Dose: Yes Last Day of Therapy:  03/08/2023 Labs - Once weekly:  CBC/D and CMP Please pull PIC at completion of IV antibiotics Fax weekly lab results  promptly to [phone] Method of administration: elastomeric Method of administration may be  changed at the discretion of home infusion pharmacist based upon assessment of the patient and/or caregiver's ability to self-administer the medication ordered.   diclofenac Sodium (VOLTAREN) 1 % GEL APPLY 4G TOPICALLY FIVE TIMES DAILY   fexofenadine (ALLEGRA ALLERGY) 180 MG tablet Take 1 tablet (180 mg total) by mouth daily.   finasteride (PROSCAR) 5 MG tablet Take 1 tablet (5 mg total) by mouth daily.   latanoprost (XALATAN) 0.005 % ophthalmic solution Place 1 drop into both eyes at bedtime.    mirtazapine (REMERON) 30 MG tablet TAKE 1 TABLET BY MOUTH EVERY NIGHT AT BEDTIME   mometasone (ELOCON) 0.1 % cream Apply 1 application. topically daily as needed (Rash). Qd up to 5 days a week to itchy bumps on body   Multiple Vitamin (MULTIVITAMIN) tablet Take 1 tablet by mouth daily.   mupirocin ointment (BACTROBAN) 2 % Apply 1 application. topically daily. Qd to open sores (Patient not taking: Reported on 03/02/2023)   Probiotic Product (DIGESTIVE ADVANTAGE PO) Take 1 tablet by mouth daily.   psyllium (METAMUCIL) 58.6 % powder Take 1 packet by mouth daily as needed (regularity).    tamsulosin (FLOMAX) 0.4 MG CAPS capsule Take 1 capsule (0.4 mg total) by mouth daily.   timolol (TIMOPTIC) 0.5 % ophthalmic solution Place 1 drop into both eyes daily.    [DISCONTINUED] alum & mag hydroxide-simeth (MAALOX/MYLANTA) 200-200-20 MG/5ML suspension Take 30 mLs by mouth every 6 (six) hours as needed for indigestion or heartburn. (Patient not taking: Reported on 03/01/2023)   [  DISCONTINUED] ketoconazole (NIZORAL) 2 % shampoo Apply 1 Application topically 3 (three) times a week. Wash scalp 3 times a week, let sit 5 minutes and rinse out (Patient not taking: Reported on 03/01/2023)   [DISCONTINUED] tiZANidine (ZANAFLEX) 4 MG tablet TAKE ONE TABLET (4 MG TOTAL) BY MOUTH EVERY EIGHT (EIGHT) HOURS AS NEEDED FOR MUSCLE SPASMS.    Review of Systems     Objective    There were no vitals taken for this  visit.     Physical Exam   Awake, alert, oriented x 3. In no apparent distress   Assessment & Plan     1. Lumbar radiculopathy   2. Overdose opiate, accidental or unintentional, initial encounter [facility]) Concern for another accidental overdue. Is failing oral diclofenac and tizanidine. Will try tramadol 50mg  1-2 Q 8 hours and short course of  predniSONE (DELTASONE) 20 MG tablet; Take 1 tablet (20 mg total) by mouth 2 (two) times daily with a meal for 5 days.  Dispense: 10 tablet; Refill: 0   Is to follow up with neurosurgery as scheduled.     I discussed the assessment and treatment plan with the patient. The patient was provided an opportunity to ask questions and all were answered. The patient agreed with the plan and demonstrated an understanding of the instructions.   The patient was advised to call back or seek an in-person evaluation if the symptoms worsen or if the condition fails to improve as anticipated.  I provided 12 minutes of non-face-to-face time during this encounter.    [name], MD [facility] [phone] (phone) [phone] (fax)  [facility]

## 2023-02-18 NOTE — Telephone Encounter (Signed)
Reason for Disposition  [1] MODERATE back pain (e.g., interferes with normal activities) AND [2] present > 3 days  Answer Assessment - Initial Assessment Questions 1. ONSET: "When did the pain begin?"      2 weeks ago- Rx for back pain- not better- called 911- diagnosed with sepsis, home on IV infusion, back pain is still present- not resting- has appointment Tuesday with Ortho 2. LOCATION: "Where does it hurt?" (upper, mid or lower back)     Lower back R hip 3. SEVERITY: "How bad is the pain?"  (e.g., Scale 1-10; mild, moderate, or severe)   - MILD (1-3): Doesn't interfere with normal activities.    - MODERATE (4-7): Interferes with normal activities or awakens from sleep.    - SEVERE (8-10): Excruciating pain, unable to do any normal activities.      moderate 4. PATTERN: "Is the pain constant?" (e.g., yes, no; constant, intermittent)      Constant since getting home 5. RADIATION: "Does the pain shoot into your legs or somewhere else?"     no 6. CAUSE:  "What do you think is causing the back pain?"      Previous back injury 7. BACK OVERUSE:  "Any recent lifting of heavy objects, strenuous work or exercise?"     Recent hospital stay 8. MEDICINES: "What have you taken so far for the pain?" (e.g., nothing, acetaminophen, NSAIDS)     Muscle relaxer, ibuprofen and Tylenol  9. NEUROLOGIC SYMPTOMS: "Do you have any weakness, numbness, or problems with bowel/bladder control?"     no 10. OTHER SYMPTOMS: "Do you have any other symptoms?" (e.g., fever, abdomen pain, burning with urination, blood in urine)       no  Protocols used: Back Pain-A-AH

## 2023-02-21 DIAGNOSIS — G9341 Metabolic encephalopathy: Secondary | ICD-10-CM | POA: Diagnosis not present

## 2023-02-21 DIAGNOSIS — F419 Anxiety disorder, unspecified: Secondary | ICD-10-CM | POA: Diagnosis not present

## 2023-02-21 DIAGNOSIS — A4101 Sepsis due to Methicillin susceptible Staphylococcus aureus: Secondary | ICD-10-CM | POA: Diagnosis not present

## 2023-02-21 DIAGNOSIS — N400 Enlarged prostate without lower urinary tract symptoms: Secondary | ICD-10-CM | POA: Diagnosis not present

## 2023-02-21 DIAGNOSIS — F32A Depression, unspecified: Secondary | ICD-10-CM | POA: Diagnosis not present

## 2023-02-21 DIAGNOSIS — A419 Sepsis, unspecified organism: Secondary | ICD-10-CM | POA: Diagnosis not present

## 2023-02-21 DIAGNOSIS — I10 Essential (primary) hypertension: Secondary | ICD-10-CM | POA: Diagnosis not present

## 2023-02-21 NOTE — Progress Notes (Unsigned)
Referring Physician:  No referring provider defined for this encounter.  Primary Physician:  Malva Limes, MD  History of Present Illness: 02/21/2023 [name] is here today with a chief complaint of ***  Acute right-sided low back pain without sciatica  Muscle spasm of back     Duration: ***02/05/2023 Location: ***Acute right-sided low back pain without sciatica  Quality: *** Severity: ***  Precipitating: aggravated by *** Modifying factors: made better by *** Weakness: none Timing: *** Bowel/Bladder Dysfunction: none  Conservative measures:  Physical therapy: *** has not participated Multimodal medical therapy including regular antiinflammatories: *** tiZANidine, norco,  prednisone, tramadol  Injections: *** has not received epidural steroid injections  Past Surgery: *** L3-4 decompression 09/19/18 by Dr Adriana Simas Lumbar fusion in 2010 Neck surgery???***  [name] has ***no symptoms of cervical myelopathy.  The symptoms are causing a significant impact on the patient's life.   Review of Systems:  A 10 point review of systems is negative, except for the pertinent positives and negatives detailed in the HPI.  Past Medical History: Past Medical History:  Diagnosis Date   Actinic keratosis    Arthritis    Basal cell carcinoma 11/09/2013   R lateral edge of clavicle   Basal cell carcinoma 02/03/2016   L lateral supraclavicular base of neck/excision   Basal cell carcinoma 11/29/2018   L upper back   Basal cell carcinoma 09/11/2020   R mid back    Cancer    Basal cell carcinoma bilateral arms and left shoulder   Complication of anesthesia    slow to wake up with shoulder surgery   Depression    Enlarged prostate    H/O elbow surgery    left   Herpes simplex    History of kidney stones    History of MRSA infection 2004   skin around eye   Hypertension    Kidney stone    Lumbar stenosis    Migraine headache    Motion sickness    ocean  boats   PONV (postoperative nausea and vomiting)    Tendonitis of ankle, left     Past Surgical History: Past Surgical History:  Procedure Laterality Date   APPENDECTOMY     back injection     BACK SURGERY     CYSTOSCOPY/URETEROSCOPY/HOLMIUM LASER/STENT PLACEMENT Left 12/18/2015   Procedure: CYSTOSCOPY/URETEROSCOPY/HOLMIUM LASER LITHOTRIPSY;  Surgeon: Vanna Scotland, MD;  Location: [facility];  Service: Urology;  Laterality: Left;   ELBOW SURGERY     LOWER BACK SURGERY  2010   L4-5 fusion   LUMBAR LAMINECTOMY/DECOMPRESSION MICRODISCECTOMY N/A 08/21/2018   Procedure: LUMBAR LAMINECTOMY/DECOMPRESSION MICRODISCECTOMY 1 LEVEL-L3/4;  Surgeon: Lucy Chris, MD;  Location: [facility];  Service: Neurosurgery;  Laterality: N/A;   NECK SURGERY     Upper neck surgery; with wires placed   SHOULDER SURGERY Right 11/10/2011   arthroscopic surgery . Dr. Katrinka Blazing, [facility]   TEE WITHOUT CARDIOVERSION N/A 02/11/2023   Procedure: TRANSESOPHAGEAL ECHOCARDIOGRAM;  Surgeon: Debbe Odea, MD;  Location: [facility];  Service: Cardiovascular;  Laterality: N/A;   TONSILLECTOMY     TRICEPS TENDON REPAIR Left 12/04/2019   Procedure: TRICEPS TENDON REPAIR, OLECRANON BURSA;  Surgeon: Signa Kell, MD;  Location: [facility];  Service: Orthopedics;  Laterality: Left;    Allergies: Allergies as of 02/22/2023 - Review Complete 02/18/2023  Allergen Reaction Noted   Penicillins Other (See Comments) 11/18/2015    Medications: Outpatient Encounter Medications as of 02/22/2023  Medication Sig   alum &  mag hydroxide-simeth (MAALOX/MYLANTA) 200-200-20 MG/5ML suspension Take 30 mLs by mouth every 6 (six) hours as needed for indigestion or heartburn.   amLODipine (NORVASC) 10 MG tablet TAKE 1 TABLET BY MOUTH ONCE A DAY   aspirin 81 MG chewable tablet Chew by mouth daily.   ceFAZolin (ANCEF) IVPB Inject 6 g into the vein continuous for 23 days. Infuse cefazolin 6gm daily over 24h as continuous infusion Indication:  MSSA  bacteremia First Dose: Yes Last Day of Therapy:  03/08/2023 Labs - Once weekly:  CBC/D and CMP Please pull PIC at completion of IV antibiotics Fax weekly lab results  promptly to [phone] Method of administration: elastomeric Method of administration may be changed at the discretion of home infusion pharmacist based upon assessment of the patient and/or caregiver's ability to self-administer the medication ordered.   diclofenac Sodium (VOLTAREN) 1 % GEL APPLY 4G TOPICALLY FIVE TIMES DAILY   fexofenadine (ALLEGRA ALLERGY) 180 MG tablet Take 1 tablet (180 mg total) by mouth daily.   finasteride (PROSCAR) 5 MG tablet Take 1 tablet (5 mg total) by mouth daily.   ketoconazole (NIZORAL) 2 % shampoo Apply 1 Application topically 3 (three) times a week. Wash scalp 3 times a week, let sit 5 minutes and rinse out   latanoprost (XALATAN) 0.005 % ophthalmic solution Place 1 drop into both eyes at bedtime.    mirtazapine (REMERON) 30 MG tablet TAKE 1 TABLET BY MOUTH EVERY NIGHT AT BEDTIME   mometasone (ELOCON) 0.1 % cream Apply 1 application. topically daily as needed (Rash). Qd up to 5 days a week to itchy bumps on body   Multiple Vitamin (MULTIVITAMIN) tablet Take 1 tablet by mouth daily.   mupirocin ointment (BACTROBAN) 2 % Apply 1 application. topically daily. Qd to open sores   predniSONE (DELTASONE) 20 MG tablet Take 1 tablet (20 mg total) by mouth 2 (two) times daily with a meal for 5 days.   Probiotic Product (DIGESTIVE ADVANTAGE PO) Take 1 tablet by mouth daily.   psyllium (METAMUCIL) 58.6 % powder Take 1 packet by mouth daily as needed (regularity).    tamsulosin (FLOMAX) 0.4 MG CAPS capsule Take 1 capsule (0.4 mg total) by mouth daily.   timolol (TIMOPTIC) 0.5 % ophthalmic solution Place 1 drop into both eyes daily.    tiZANidine (ZANAFLEX) 4 MG tablet TAKE ONE TABLET (4 MG TOTAL) BY MOUTH EVERY EIGHT (EIGHT) HOURS AS NEEDED FOR MUSCLE SPASMS.   traMADol (ULTRAM) 50 MG tablet Take 1-2  tablets (50-100 mg total) by mouth every 8 (eight) hours as needed for up to 20 days.   No facility-administered encounter medications on file as of 02/22/2023.    Social History: Social History   Tobacco Use   Smoking status: Never   Smokeless tobacco: Never  Vaping Use   Vaping Use: Never used  Substance Use Topics   Alcohol use: No    Alcohol/week: 0.0 standard drinks of alcohol   Drug use: No    Family Medical History: Family History  Problem Relation Age of Onset   Depression Son    Diabetes Neg Hx    Cancer Neg Hx    Heart attack Neg Hx    Kidney disease Neg Hx    Prostate cancer Neg Hx    Kidney cancer Neg Hx    Bladder Cancer Neg Hx     Physical Examination: @VITALWITHPAIN @  General: Patient is well developed, well nourished, calm, collected, and in no apparent distress. Attention to examination is appropriate.  Psychiatric: Patient is non-anxious.  Head:  Pupils equal, round, and reactive to light.  ENT:  Oral mucosa appears well hydrated.  Neck:   Supple.  ***Full range of motion.  Respiratory: Patient is breathing without any difficulty.  Extremities: No edema.  Vascular: Palpable dorsal pedal pulses.  Skin:   On exposed skin, there are no abnormal skin lesions.  NEUROLOGICAL:     Awake, alert, oriented to person, place, and time.  Speech is clear and fluent. Fund of knowledge is appropriate.   Cranial Nerves: Pupils equal round and reactive to light.  Facial tone is symmetric.  Facial sensation is symmetric.  ROM of spine: ***full.  Palpation of spine: ***non tender.    Strength: Side Biceps Triceps Deltoid Interossei Grip Wrist Ext. Wrist Flex.  R L Side Iliopsoas Quads Hamstring PF DF EHL  R L Reflexes are ***2+ and symmetric at the biceps, triceps, brachioradialis, patella and achilles.   Hoffman's is absent.  Clonus is not present.  Toes are down-going.  Bilateral upper and  lower extremity sensation is intact to light touch.    Gait is normal.   No difficulty with tandem gait.   No evidence of dysmetria noted.  Medical Decision Making  Imaging: ***  I have personally reviewed the images and agree with the above interpretation.  Assessment and Plan: Mr. Straw is a pleasant 77 y.o. male with ***    Thank you for involving me in the care of this patient.   I spent a total of *** minutes in both face-to-face and non-face-to-face activities for this visit on the date of this encounter.   [facility]

## 2023-02-22 ENCOUNTER — Ambulatory Visit (INDEPENDENT_AMBULATORY_CARE_PROVIDER_SITE_OTHER): Payer: Medicare Other | Admitting: Neurosurgery

## 2023-02-22 ENCOUNTER — Encounter: Payer: Self-pay | Admitting: Neurosurgery

## 2023-02-22 VITALS — BP 146/78 | Ht 76.0 in | Wt 205.0 lb

## 2023-02-22 DIAGNOSIS — M5416 Radiculopathy, lumbar region: Secondary | ICD-10-CM

## 2023-02-22 DIAGNOSIS — M47817 Spondylosis without myelopathy or radiculopathy, lumbosacral region: Secondary | ICD-10-CM

## 2023-02-22 DIAGNOSIS — M4727 Other spondylosis with radiculopathy, lumbosacral region: Secondary | ICD-10-CM | POA: Diagnosis not present

## 2023-02-22 NOTE — Progress Notes (Unsigned)
I,J'ya E Brandie Lopes,acting as a scribe for Jacky Kindle, FNP.,have documented all relevant documentation on the behalf of Jacky Kindle, FNP,as directed by  Jacky Kindle, FNP while in the presence of Jacky Kindle, FNP.  Established patient visit  Patient: Brett Bender   DOB: [birth year]   77 y.o. Male  MRN: [id number] Visit Date: 02/23/2023  Today's healthcare provider: Jacky Kindle, FNP  Introduced to nurse practitioner role and practice setting.  All questions answered.  Discussed provider/patient relationship and expectations.  Chief Complaint  Patient presents with   Hospitalization Follow-up   Subjective    HPI  Follow up Hospitalization  Patient was admitted to [facility] on 02/07/2023 and discharged on 02/13/2023. He was treated for Sepsis. Treatment for this included sepsis fluids and antibiotics. Telephone follow up was done on 02/16/2023 He reports adequate compliance with treatment. He reports this condition is improved.  Patient reports that his lower back pain has improved, is inquiring about remaining muscle relaxer prescribed by his PCP. He also complains of symptoms associated with gastric reflux.  ----------------------------------------------------------------------------------------- -   Medications: Outpatient Medications Prior to Visit  Medication Sig   alum & mag hydroxide-simeth (MAALOX/MYLANTA) 200-200-20 MG/5ML suspension Take 30 mLs by mouth every 6 (six) hours as needed for indigestion or heartburn.   amLODipine (NORVASC) 10 MG tablet TAKE 1 TABLET BY MOUTH ONCE A DAY   aspirin 81 MG chewable tablet Chew by mouth daily.   ceFAZolin (ANCEF) IVPB Inject 6 g into the vein continuous for 23 days. Infuse cefazolin 6gm daily over 24h as continuous infusion Indication:  MSSA bacteremia First Dose: Yes Last Day of Therapy:  03/08/2023 Labs - Once weekly:  CBC/D and CMP Please pull PIC at completion of IV antibiotics Fax weekly lab results  promptly to  [phone] Method of administration: elastomeric Method of administration may be changed at the discretion of home infusion pharmacist based upon assessment of the patient and/or caregiver's ability to self-administer the medication ordered.   diclofenac Sodium (VOLTAREN) 1 % GEL APPLY 4G TOPICALLY FIVE TIMES DAILY   fexofenadine (ALLEGRA ALLERGY) 180 MG tablet Take 1 tablet (180 mg total) by mouth daily.   finasteride (PROSCAR) 5 MG tablet Take 1 tablet (5 mg total) by mouth daily.   ketoconazole (NIZORAL) 2 % shampoo Apply 1 Application topically 3 (three) times a week. Wash scalp 3 times a week, let sit 5 minutes and rinse out   latanoprost (XALATAN) 0.005 % ophthalmic solution Place 1 drop into both eyes at bedtime.    mirtazapine (REMERON) 30 MG tablet TAKE 1 TABLET BY MOUTH EVERY NIGHT AT BEDTIME   mometasone (ELOCON) 0.1 % cream Apply 1 application. topically daily as needed (Rash). Qd up to 5 days a week to itchy bumps on body   Multiple Vitamin (MULTIVITAMIN) tablet Take 1 tablet by mouth daily.   mupirocin ointment (BACTROBAN) 2 % Apply 1 application. topically daily. Qd to open sores   predniSONE (DELTASONE) 20 MG tablet Take 1 tablet (20 mg total) by mouth 2 (two) times daily with a meal for 5 days.   Probiotic Product (DIGESTIVE ADVANTAGE PO) Take 1 tablet by mouth daily.   psyllium (METAMUCIL) 58.6 % powder Take 1 packet by mouth daily as needed (regularity).    tamsulosin (FLOMAX) 0.4 MG CAPS capsule Take 1 capsule (0.4 mg total) by mouth daily.   timolol (TIMOPTIC) 0.5 % ophthalmic solution Place 1 drop into both eyes daily.  tiZANidine (ZANAFLEX) 4 MG tablet TAKE ONE TABLET (4 MG TOTAL) BY MOUTH EVERY EIGHT (EIGHT) HOURS AS NEEDED FOR MUSCLE SPASMS.   traMADol (ULTRAM) 50 MG tablet Take 1-2 tablets (50-100 mg total) by mouth every 8 (eight) hours as needed for up to 20 days.   No facility-administered medications prior to visit.    Review of Systems    Objective     BP 133/76 (BP Location: Right Arm, Patient Position: Sitting, Cuff Size: Large)   Pulse (!) 58   Temp 97.9 F (36.6 C) (Oral)   Ht 6\' 6"  (1.981 m)   Wt 194 lb (88 kg)   SpO2 98%   BMI 22.42 kg/m   Physical Exam Vitals and nursing note reviewed.  Constitutional:      Appearance: Normal appearance. He is normal weight.  HENT:     Head: Normocephalic and atraumatic.  Cardiovascular:     Rate and Rhythm: Normal rate and regular rhythm.     Pulses: Normal pulses.     Heart sounds: Normal heart sounds.  Pulmonary:     Effort: Pulmonary effort is normal.     Breath sounds: Normal breath sounds.  Musculoskeletal:     Cervical back: Normal range of motion.     Comments: Asthenia; use of walker   Skin:    General: Skin is warm and dry.     Capillary Refill: Capillary refill takes less than 2 seconds.     Comments: Multiple skin abrasions iso skin picking; PICC in RUE with attached ABX  Neurological:     General: No focal deficit present.     Mental Status: He is alert and oriented to person, place, and time. Mental status is at baseline.     Motor: Weakness present.     Gait: Gait abnormal.     Comments: Use of walker   Psychiatric:        Mood and Affect: Mood normal.        Behavior: Behavior normal.        Thought Content: Thought content normal.        Judgment: Judgment normal.     No results found for any visits on 02/23/23.  Assessment & Plan     Problem List Items Addressed This Visit       Cardiovascular and Mediastinum   Tortuous aorta    Chronic, BP remains stable Patient remains on Norvasc 10 mg       Relevant Orders   Lipid panel     Other   Compulsive skin picking    Chronic, variable Likely cause of bacteria Remains on IV abx at this time Referral placed to cardiology for TEE with anesthesia to assess for vegetation Remains on remeron nightly at 30 mg; defer medication changes at this time        Elevated glucose    Recommend A1c given  chronic elevated glucose as well as protein in urine Continue to recommend balanced, lower carb meals. Smaller meal size, adding snacks. Choosing water as drink of choice and increasing purposeful exercise.       Relevant Orders   Hemoglobin A1c   Elevated lipids    Chronic elevated; repeat LP Known tortuous aorta On ASA alone; not on statin Reassess risk for ASCVD      Relevant Orders   Lipid panel   History of sepsis    6 days hospital stay in setting of concern for overdose with expired narcotics and muscle relaxants given complaint  of flank pain found to be DDD Defer refills of any PRN medication at this time Continue to use RICE and tylenol to assist Avoid use of OTC NSAIDs given ?CV correlation as well as AKI while inpatient Remains on IV abx at this time; has upcoming appt with ID for f/u; referral placed to cardiology for TEE      Relevant Orders   CBC with Differential/Platelet   Comprehensive Metabolic Panel (CMET)   TSH + free T4   Ambulatory referral to Cardiology   Hospital discharge follow-up - Primary    Admitted for AMS with concern for OD and bacteremia from chronic skin picking      Relevant Orders   CBC with Differential/Platelet   Comprehensive Metabolic Panel (CMET)   TSH + free T4   Ambulatory referral to Cardiology   Nocturia    Chronic, on BPH medications Repeat PSA       Relevant Orders   PSA   Proteinuria    Acute, repeat urine micro given previous concerns Continue to monitor kidney function and hydration       Relevant Orders   Urine Microalbumin w/creat. ratio   Return if symptoms worsen or fail to improve.     Leilani Merl, FNP, have reviewed all documentation for this visit. The documentation on 02/23/23 for the exam, diagnosis, procedures, and orders are all accurate and complete.  Jacky Kindle, FNP  [facility] [phone] (phone) [phone] (fax)  [facility]

## 2023-02-23 ENCOUNTER — Encounter: Payer: Self-pay | Admitting: Family Medicine

## 2023-02-23 ENCOUNTER — Telehealth: Payer: Self-pay

## 2023-02-23 ENCOUNTER — Ambulatory Visit (INDEPENDENT_AMBULATORY_CARE_PROVIDER_SITE_OTHER): Payer: Medicare Other | Admitting: Family Medicine

## 2023-02-23 VITALS — BP 133/76 | HR 58 | Temp 97.9°F | Ht 78.0 in | Wt 194.0 lb

## 2023-02-23 DIAGNOSIS — Z8619 Personal history of other infectious and parasitic diseases: Secondary | ICD-10-CM | POA: Diagnosis not present

## 2023-02-23 DIAGNOSIS — F424 Excoriation (skin-picking) disorder: Secondary | ICD-10-CM | POA: Diagnosis not present

## 2023-02-23 DIAGNOSIS — R809 Proteinuria, unspecified: Secondary | ICD-10-CM | POA: Insufficient documentation

## 2023-02-23 DIAGNOSIS — R351 Nocturia: Secondary | ICD-10-CM | POA: Diagnosis not present

## 2023-02-23 DIAGNOSIS — Z09 Encounter for follow-up examination after completed treatment for conditions other than malignant neoplasm: Secondary | ICD-10-CM | POA: Insufficient documentation

## 2023-02-23 DIAGNOSIS — I771 Stricture of artery: Secondary | ICD-10-CM | POA: Diagnosis not present

## 2023-02-23 DIAGNOSIS — R7309 Other abnormal glucose: Secondary | ICD-10-CM | POA: Insufficient documentation

## 2023-02-23 DIAGNOSIS — E785 Hyperlipidemia, unspecified: Secondary | ICD-10-CM

## 2023-02-23 NOTE — Assessment & Plan Note (Signed)
Chronic, on BPH medications Repeat PSA

## 2023-02-23 NOTE — Assessment & Plan Note (Signed)
Admitted for AMS with concern for OD and bacteremia from chronic skin picking

## 2023-02-23 NOTE — Telephone Encounter (Signed)
Patient called stating that home health nurses had told him two different dates for EOT. Informed patient IV abx EOT is 03/08/2023. Patient also given appointment information with Dr.Ravishankar on 4/23. Patient voiced his understanding.    Michel Eskelson Lesli Albee, CMA

## 2023-02-23 NOTE — Assessment & Plan Note (Signed)
Recommend A1c given chronic elevated glucose as well as protein in urine Continue to recommend balanced, lower carb meals. Smaller meal size, adding snacks. Choosing water as drink of choice and increasing purposeful exercise.

## 2023-02-23 NOTE — Assessment & Plan Note (Signed)
Chronic, variable Likely cause of bacteria Remains on IV abx at this time Referral placed to cardiology for TEE with anesthesia to assess for vegetation Remains on remeron nightly at 30 mg; defer medication changes at this time

## 2023-02-23 NOTE — Assessment & Plan Note (Signed)
Acute, repeat urine micro given previous concerns Continue to monitor kidney function and hydration

## 2023-02-23 NOTE — Assessment & Plan Note (Signed)
Chronic, BP remains stable Patient remains on Norvasc 10 mg

## 2023-02-23 NOTE — Assessment & Plan Note (Signed)
6 days hospital stay in setting of concern for overdose with expired narcotics and muscle relaxants given complaint of flank pain found to be DDD Defer refills of any PRN medication at this time Continue to use RICE and tylenol to assist Avoid use of OTC NSAIDs given ?CV correlation as well as AKI while inpatient Remains on IV abx at this time; has upcoming appt with ID for f/u; referral placed to cardiology for TEE

## 2023-02-23 NOTE — Assessment & Plan Note (Signed)
Chronic elevated; repeat LP Known tortuous aorta On ASA alone; not on statin Reassess risk for ASCVD

## 2023-02-24 ENCOUNTER — Other Ambulatory Visit: Payer: Self-pay | Admitting: Family Medicine

## 2023-02-24 DIAGNOSIS — F419 Anxiety disorder, unspecified: Secondary | ICD-10-CM | POA: Diagnosis not present

## 2023-02-24 DIAGNOSIS — I10 Essential (primary) hypertension: Secondary | ICD-10-CM | POA: Diagnosis not present

## 2023-02-24 DIAGNOSIS — G9341 Metabolic encephalopathy: Secondary | ICD-10-CM | POA: Diagnosis not present

## 2023-02-24 DIAGNOSIS — F32A Depression, unspecified: Secondary | ICD-10-CM | POA: Diagnosis not present

## 2023-02-24 DIAGNOSIS — A4101 Sepsis due to Methicillin susceptible Staphylococcus aureus: Secondary | ICD-10-CM | POA: Diagnosis not present

## 2023-02-24 DIAGNOSIS — N400 Enlarged prostate without lower urinary tract symptoms: Secondary | ICD-10-CM | POA: Diagnosis not present

## 2023-02-24 LAB — CBC WITH DIFFERENTIAL/PLATELET
Basophils Absolute: 0 10*3/uL (ref 0.0–0.2)
Basos: 0 %
EOS (ABSOLUTE): 0 10*3/uL (ref 0.0–0.4)
Eos: 0 %
Hematocrit: 40.3 % (ref 37.5–51.0)
Hemoglobin: 14.1 g/dL (ref 13.0–17.7)
Immature Grans (Abs): 0.1 10*3/uL (ref 0.0–0.1)
Immature Granulocytes: 1 %
Lymphocytes Absolute: 2.1 10*3/uL (ref 0.7–3.1)
Lymphs: 19 %
MCH: 30.7 pg (ref 26.6–33.0)
MCHC: 35 g/dL (ref 31.5–35.7)
MCV: 88 fL (ref 79–97)
Monocytes Absolute: 0.5 10*3/uL (ref 0.1–0.9)
Monocytes: 4 %
Neutrophils Absolute: 8.2 10*3/uL — ABNORMAL HIGH (ref 1.4–7.0)
Neutrophils: 76 %
Platelets: 677 10*3/uL — ABNORMAL HIGH (ref 150–450)
RBC: 4.59 x10E6/uL (ref 4.14–5.80)
RDW: 12.1 % (ref 11.6–15.4)
WBC: 10.9 10*3/uL — ABNORMAL HIGH (ref 3.4–10.8)

## 2023-02-24 LAB — HEMOGLOBIN A1C
Est. average glucose Bld gHb Est-mCnc: 120 mg/dL
Hgb A1c MFr Bld: 5.8 % — ABNORMAL HIGH (ref 4.8–5.6)

## 2023-02-24 LAB — LIPID PANEL
Chol/HDL Ratio: 3.9 ratio (ref 0.0–5.0)
Cholesterol, Total: 164 mg/dL (ref 100–199)
HDL: 42 mg/dL (ref >39)
LDL Chol Calc (NIH): 100 mg/dL — ABNORMAL HIGH (ref 0–99)
Triglycerides: 120 mg/dL (ref 0–149)
VLDL Cholesterol Cal: 22 mg/dL (ref 5–40)

## 2023-02-24 LAB — MICROALBUMIN / CREATININE URINE RATIO
Creatinine, Urine: 64.2 mg/dL
Microalb/Creat Ratio: 46 mg/g{creat} — ABNORMAL HIGH (ref 0–29)
Microalbumin, Urine: 29.5 ug/mL

## 2023-02-24 LAB — COMPREHENSIVE METABOLIC PANEL WITH GFR
ALT: 16 [IU]/L (ref 0–44)
AST: 21 [IU]/L (ref 0–40)
Albumin/Globulin Ratio: 1 — ABNORMAL LOW (ref 1.2–2.2)
Albumin: 4 g/dL (ref 3.8–4.8)
Alkaline Phosphatase: 123 [IU]/L — ABNORMAL HIGH (ref 44–121)
BUN/Creatinine Ratio: 25 — ABNORMAL HIGH (ref 10–24)
BUN: 26 mg/dL (ref 8–27)
Bilirubin Total: 0.5 mg/dL (ref 0.0–1.2)
CO2: 27 mmol/L (ref 20–29)
Calcium: 10.4 mg/dL — ABNORMAL HIGH (ref 8.6–10.2)
Chloride: 96 mmol/L (ref 96–106)
Creatinine, Ser: 1.06 mg/dL (ref 0.76–1.27)
Globulin, Total: 4.1 g/dL (ref 1.5–4.5)
Glucose: 113 mg/dL — ABNORMAL HIGH (ref 70–99)
Potassium: 4.9 mmol/L (ref 3.5–5.2)
Sodium: 137 mmol/L (ref 134–144)
Total Protein: 8.1 g/dL (ref 6.0–8.5)
eGFR: 73 mL/min/{1.73_m2}

## 2023-02-24 LAB — TSH+FREE T4
Free T4: 1.44 ng/dL (ref 0.82–1.77)
TSH: 1.33 u[IU]/mL (ref 0.450–4.500)

## 2023-02-24 LAB — PSA: Prostate Specific Ag, Serum: 0.1 ng/mL (ref 0.0–4.0)

## 2023-02-24 MED ORDER — ROSUVASTATIN CALCIUM 20 MG PO TABS
20.0000 mg | ORAL_TABLET | Freq: Every day | ORAL | 3 refills | Status: DC
Start: 2023-02-24 — End: 2024-03-05

## 2023-02-24 NOTE — Telephone Encounter (Signed)
Copied from CRM [id number]. Topic: General - Other >> Feb 24, 2023  1:31 PM Clide Dales wrote: Home Health Verbal Orders - Caller/Agency: [facility] Callback Number: [phone] Opt 2 Requesting OT/PT/Skilled Nursing/Social Work/Speech Therapy: Nursing Frequency: 2w1, 1w8

## 2023-02-24 NOTE — Telephone Encounter (Signed)
Copied from CRM [id number]. Topic: General - Other >> Feb 24, 2023  2:42 PM Dondra Prader E wrote: Reason for CRM: Pt called back to check status of refill request for tramadol, pt is hoping to receive this prior to the weekend. He says he has been out of his current supply for a day and a half.

## 2023-02-24 NOTE — Telephone Encounter (Signed)
Medication Refill - Medication: tramadol   Has the patient contacted their pharmacy? No. (Agent: If no, request that the patient contact the pharmacy for the refill. If patient does not wish to contact the pharmacy document the reason why and proceed with request.) (Agent: If yes, when and what did the pharmacy advise?)  Preferred Pharmacy (with phone number or street name): [facility] Has the patient been seen for an appointment in the last year OR does the patient have an upcoming appointment? Yes.    Agent: Please be advised that RX refills may take up to 3 business days. We ask that you follow-up with your pharmacy.

## 2023-02-24 NOTE — Telephone Encounter (Signed)
Requested medications are due for refill today.  unsure  Requested medications are on the active medications list.  yes  Last refill. 02/18/2023 #15  Future visit scheduled.   yes  Notes to clinic.  Recent refill. Please review for refill.    Requested Prescriptions  Pending Prescriptions Disp Refills   traMADol (ULTRAM) 50 MG tablet 15 tablet 0    Sig: Take 1-2 tablets (50-100 mg total) by mouth every 8 (eight) hours as needed for up to 20 days.     Not Delegated - Analgesics:  Opioid Agonists Failed - 02/24/2023  3:18 PM      Failed - This refill cannot be delegated      Passed - Urine Drug Screen completed in last 360 days      Passed - Valid encounter within last 3 months    Recent Outpatient Visits           [facility] discharge follow-up   [facility] Jacky Kindle, FNP   6 days ago    [facility] Malva Limes, MD   2 weeks ago Acute right-sided low back pain without sciatica   [facility] Malva Limes, MD   1 year ago Prostate cancer screening   [facility] Malva Limes, MD   1 year ago COVID-19    [facility] Loura Pardon, MD       Future Appointments             In 5 days Lynn Ito, MD [facility]   In 1 week Fisher, Demetrios Isaacs, MD [facility], [facility]   In 3 weeks Deirdre Evener, MD [facility] [place] [facility]   In 1 month Agbor-Etang, Arlys John, MD [facility] at [place]   In 3 months McGowan, Elana Alm [facility]

## 2023-02-24 NOTE — Progress Notes (Signed)
Improving infection numbers. Continue to keep appt with Infectious disease for plan for Intravenous Antibiotics. Please let us know if you do not hear from cardiology for TEE [trans-esophageal-echocardiogram] imaging. Borderline increase in one liver enzyme; we can repeat labs in 3 months to follow. A1c confirms new diagnosis of pre-diabetes. Cholesterol remains elevated with LDL [bad cholesterol] at 100. Continue to recommend high dose statin given stroke and heart attack risk at 32% in 10 years.  The 10-year ASCVD risk score (Arnett DK, et al., 2019) is: 31.7%   Values used to calculate the score:     Age: 77 years     Sex: Male     Is Non-Hispanic African American: No     Diabetic: No     Tobacco smoker: No     Systolic Blood Pressure: 133 mmHg     Is BP treated: Yes     HDL Cholesterol: 42 mg/dL     Total Cholesterol: 164 mg/dL

## 2023-02-25 ENCOUNTER — Encounter: Payer: Medicare Other | Admitting: Family Medicine

## 2023-02-28 DIAGNOSIS — A4101 Sepsis due to Methicillin susceptible Staphylococcus aureus: Secondary | ICD-10-CM | POA: Diagnosis not present

## 2023-02-28 DIAGNOSIS — A419 Sepsis, unspecified organism: Secondary | ICD-10-CM | POA: Diagnosis not present

## 2023-02-28 DIAGNOSIS — I10 Essential (primary) hypertension: Secondary | ICD-10-CM | POA: Diagnosis not present

## 2023-02-28 DIAGNOSIS — G9341 Metabolic encephalopathy: Secondary | ICD-10-CM | POA: Diagnosis not present

## 2023-02-28 DIAGNOSIS — F419 Anxiety disorder, unspecified: Secondary | ICD-10-CM | POA: Diagnosis not present

## 2023-02-28 DIAGNOSIS — F32A Depression, unspecified: Secondary | ICD-10-CM | POA: Diagnosis not present

## 2023-02-28 DIAGNOSIS — N400 Enlarged prostate without lower urinary tract symptoms: Secondary | ICD-10-CM | POA: Diagnosis not present

## 2023-03-01 ENCOUNTER — Encounter: Payer: Self-pay | Admitting: Infectious Diseases

## 2023-03-01 ENCOUNTER — Other Ambulatory Visit
Admission: RE | Admit: 2023-03-01 | Discharge: 2023-03-01 | Disposition: A | Discharge status: Home / Self Care | Payer: Medicare Other | Source: Ambulatory Visit | Attending: Infectious Diseases | Admitting: Infectious Diseases

## 2023-03-01 ENCOUNTER — Other Ambulatory Visit: Payer: Self-pay | Admitting: Family Medicine

## 2023-03-01 ENCOUNTER — Ambulatory Visit: Payer: Medicare Other | Attending: Infectious Diseases | Admitting: Infectious Diseases

## 2023-03-01 ENCOUNTER — Telehealth: Payer: Self-pay

## 2023-03-01 VITALS — BP 119/80 | HR 75 | Temp 97.4°F

## 2023-03-01 DIAGNOSIS — H409 Unspecified glaucoma: Secondary | ICD-10-CM | POA: Diagnosis present

## 2023-03-01 DIAGNOSIS — R7881 Bacteremia: Secondary | ICD-10-CM | POA: Insufficient documentation

## 2023-03-01 DIAGNOSIS — M4627 Osteomyelitis of vertebra, lumbosacral region: Secondary | ICD-10-CM | POA: Diagnosis not present

## 2023-03-01 DIAGNOSIS — N400 Enlarged prostate without lower urinary tract symptoms: Secondary | ICD-10-CM | POA: Insufficient documentation

## 2023-03-01 DIAGNOSIS — G062 Extradural and subdural abscess, unspecified: Secondary | ICD-10-CM | POA: Diagnosis not present

## 2023-03-01 DIAGNOSIS — N401 Enlarged prostate with lower urinary tract symptoms: Secondary | ICD-10-CM | POA: Diagnosis present

## 2023-03-01 DIAGNOSIS — M1990 Unspecified osteoarthritis, unspecified site: Secondary | ICD-10-CM | POA: Diagnosis present

## 2023-03-01 DIAGNOSIS — Z87442 Personal history of urinary calculi: Secondary | ICD-10-CM | POA: Insufficient documentation

## 2023-03-01 DIAGNOSIS — Z880 Allergy status to penicillin: Secondary | ICD-10-CM | POA: Diagnosis not present

## 2023-03-01 DIAGNOSIS — E785 Hyperlipidemia, unspecified: Secondary | ICD-10-CM | POA: Diagnosis present

## 2023-03-01 DIAGNOSIS — M545 Low back pain, unspecified: Secondary | ICD-10-CM | POA: Diagnosis not present

## 2023-03-01 DIAGNOSIS — Z792 Long term (current) use of antibiotics: Secondary | ICD-10-CM | POA: Diagnosis not present

## 2023-03-01 DIAGNOSIS — B9561 Methicillin susceptible Staphylococcus aureus infection as the cause of diseases classified elsewhere: Secondary | ICD-10-CM

## 2023-03-01 DIAGNOSIS — I10 Essential (primary) hypertension: Secondary | ICD-10-CM | POA: Diagnosis not present

## 2023-03-01 DIAGNOSIS — K6812 Psoas muscle abscess: Secondary | ICD-10-CM | POA: Diagnosis present

## 2023-03-01 DIAGNOSIS — M4626 Osteomyelitis of vertebra, lumbar region: Secondary | ICD-10-CM | POA: Diagnosis not present

## 2023-03-01 DIAGNOSIS — Z85828 Personal history of other malignant neoplasm of skin: Secondary | ICD-10-CM | POA: Diagnosis not present

## 2023-03-01 DIAGNOSIS — M5416 Radiculopathy, lumbar region: Secondary | ICD-10-CM | POA: Diagnosis not present

## 2023-03-01 DIAGNOSIS — M4316 Spondylolisthesis, lumbar region: Secondary | ICD-10-CM | POA: Diagnosis not present

## 2023-03-01 DIAGNOSIS — R338 Other retention of urine: Secondary | ICD-10-CM | POA: Diagnosis present

## 2023-03-01 DIAGNOSIS — M5417 Radiculopathy, lumbosacral region: Secondary | ICD-10-CM | POA: Diagnosis not present

## 2023-03-01 DIAGNOSIS — Z79899 Other long term (current) drug therapy: Secondary | ICD-10-CM | POA: Diagnosis not present

## 2023-03-01 DIAGNOSIS — E876 Hypokalemia: Secondary | ICD-10-CM | POA: Diagnosis not present

## 2023-03-01 DIAGNOSIS — Z981 Arthrodesis status: Secondary | ICD-10-CM | POA: Insufficient documentation

## 2023-03-01 DIAGNOSIS — A419 Sepsis, unspecified organism: Secondary | ICD-10-CM | POA: Diagnosis not present

## 2023-03-01 DIAGNOSIS — Z7982 Long term (current) use of aspirin: Secondary | ICD-10-CM | POA: Diagnosis not present

## 2023-03-01 DIAGNOSIS — M869 Osteomyelitis, unspecified: Secondary | ICD-10-CM | POA: Diagnosis not present

## 2023-03-01 DIAGNOSIS — R6520 Severe sepsis without septic shock: Secondary | ICD-10-CM | POA: Diagnosis not present

## 2023-03-01 DIAGNOSIS — M4646 Discitis, unspecified, lumbar region: Secondary | ICD-10-CM | POA: Diagnosis not present

## 2023-03-01 DIAGNOSIS — M62830 Muscle spasm of back: Secondary | ICD-10-CM

## 2023-03-01 DIAGNOSIS — Z818 Family history of other mental and behavioral disorders: Secondary | ICD-10-CM | POA: Diagnosis not present

## 2023-03-01 LAB — C-REACTIVE PROTEIN: CRP: 4.9 mg/dL — ABNORMAL HIGH (ref <1.0)

## 2023-03-01 LAB — SEDIMENTATION RATE: Sed Rate: 30 mm/hr — ABNORMAL HIGH (ref 0–20)

## 2023-03-01 NOTE — Telephone Encounter (Signed)
Per Dr. Rivka Safer she is extending cefazolin 6 grams -24 hour infusion until 03/23/23 ( original stop date was 03/08/23)- will also need weekly labs- CBC/Cmp-ESR/CRP  She getting MRI of the lumbar spine and also referring him to neurosurgery ( Dr.Yarboroug) who will see him after MRI.  Patient scheduled for MRI on 03/02/23  Dr. Rivka Safer has advised [name] with [employer] of orders as well. Brett Bender Jonathon Resides, CMA

## 2023-03-01 NOTE — Telephone Encounter (Signed)
Medication Refill - Medication: traMADol (ULTRAM) 50 MG tablet and tiZANidine (ZANAFLEX) 4 MG tablet   Has the patient contacted their pharmacy? Yes.   Pt told to contact provider  Preferred Pharmacy (with phone number or street name):  [facility] - [place], [place] - [address] Phone: [phone]  Fax: [phone]     Has the patient been seen for an appointment in the last year OR does the patient have an upcoming appointment? Yes.    Agent: Please be advised that RX refills may take up to 3 business days. We ask that you follow-up with your pharmacy.

## 2023-03-01 NOTE — Telephone Encounter (Signed)
Requested medication (s) are due for refill today: routing for review  Requested medication (s) are on the active medication list: yes  Last refill:  02/17/23 and 02/25/23  Future visit scheduled: yes  Notes to clinic:  Unable to refill per protocol, cannot delegate.      Requested Prescriptions  Pending Prescriptions Disp Refills   traMADol (ULTRAM) 50 MG tablet 15 tablet 0     Not Delegated - Analgesics:  Opioid Agonists Failed - 03/01/2023  4:09 PM      Failed - This refill cannot be delegated      Passed - Urine Drug Screen completed in last 360 days      Passed - Valid encounter within last 3 months    Recent Outpatient Visits           6 days ago Hospital discharge follow-up   [facility] Jacky Kindle, FNP   1 week ago    [facility]   3 weeks ago Acute right-sided low back pain without sciatica   [facility] Malva Limes, MD   1 year ago Prostate cancer screening   [facility] Malva Limes, MD   1 year ago COVID-19   [facility] Vigg, Roma Schanz, MD       Future Appointments             In 1 week Fisher, Demetrios Isaacs, MD [facility], [facility]   In 2 weeks Deirdre Evener, MD [place] [facility]   In 3 weeks Lynn Ito, MD [facility]   In 1 month Debbe Odea, MD [facility] at [place]   In 3 months McGowan, Elana Alm [facility] [place]             tiZANidine (ZANAFLEX) 4 MG tablet 20 tablet 0     Not Delegated - Cardiovascular:  Alpha-2 Agonists - tizanidine Failed - 03/01/2023  4:09 PM      Failed - This refill cannot be delegated      Passed - Valid encounter within last 6 months    Recent Outpatient Visits           6 days ago Hospital discharge follow-up   [facility] Jacky Kindle, FNP   1 week ago    [place] [facility] Malva Limes, MD   3 weeks ago Acute right-sided low back pain without sciatica   [facility] Malva Limes, MD   1 year ago Prostate cancer screening   [facility] Malva Limes, MD   1 year ago COVID-19   [place] [facility] Vigg, Roma Schanz, MD       Future Appointments             In 1 week Fisher, Demetrios Isaacs, MD [facility], [facility]   In 2 weeks Deirdre Evener, MD [facility] [place] [facility]   In 3 weeks Lynn Ito, MD [facility]   In 1 month Agbor-Etang, Arlys John, MD [facility] at [place]   In 3 months McGowan, Elana Alm [facility] [place]

## 2023-03-01 NOTE — Progress Notes (Signed)
NAME: Brett Bender  DOB: 1946/08/18  MRN: [id number]  Date/Time: 03/01/2023 9:23 AM   Subjective:   ? Brett Bender is a 77 y.o. male is here for follow up after recent hospital discharge when he was treated for MSSA bacteremia Pt was in [facility] 4[facility]1-4/7 for altered mental status for non intentional overdose of pain meds due to severe back pain - he got narcan- but the found to have fever and blood culture was positive for mssa.  This was initially was not clear.  He had wound on his back which cultured positive for MSSA.  He had severe back pain and an MRI of the lumbar spine showed degenerative disc disease but no evidence of discitis osteomyelitis or epidural abscess.  2D echo was okay.  A TEE could not be done as he did not tolerate the procedure.  It was decided not to pursue TEE under general anesthesia.  Patient was discharged home with a PICC line and 4 weeks of IV cefazolin to be given as a continuous infusion and 6 g every day.  The last day supposed to be on 30 April. Today he is here for follow-up. He is here with his daughter and wife He continues to have severe back pain He has been prescribed baclofen and tramadol by his PCP He does not have any fever He has a history of lumbar surgery L3-L4 decompression done by Dr. Adriana Simas in 2019.  He had lumbar fusion in 2010.  As well as cervical spine surgery. Past Medical History:  Diagnosis Date   Actinic keratosis    Arthritis    Basal cell carcinoma 11/09/2013   R lateral edge of clavicle   Basal cell carcinoma 02/03/2016   L lateral supraclavicular base of neck/excision   Basal cell carcinoma 11/29/2018   L upper back   Basal cell carcinoma 09/11/2020   R mid back    Cancer    Basal cell carcinoma bilateral arms and left shoulder   Complication of anesthesia    slow to wake up with shoulder surgery   Depression    Enlarged prostate    H/O elbow surgery    left   Herpes simplex    History of kidney stones    History of MRSA  infection 2004   skin around eye   Hypertension    Kidney stone    Lumbar stenosis    Migraine headache    Motion sickness    ocean boats   PONV (postoperative nausea and vomiting)    Tendonitis of ankle, left     Past Surgical History:  Procedure Laterality Date   APPENDECTOMY     back injection     BACK SURGERY     CYSTOSCOPY/URETEROSCOPY/HOLMIUM LASER/STENT PLACEMENT Left 12/18/2015   Procedure: CYSTOSCOPY/URETEROSCOPY/HOLMIUM LASER LITHOTRIPSY;  Surgeon: Vanna Scotland, MD;  Location: [facility] ORS;  Service: Urology;  Laterality: Left;   ELBOW SURGERY     LOWER BACK SURGERY  2010   L4-5 fusion   LUMBAR LAMINECTOMY/DECOMPRESSION MICRODISCECTOMY N/A 08/21/2018   Procedure: LUMBAR LAMINECTOMY/DECOMPRESSION MICRODISCECTOMY 1 LEVEL-L3/4;  Surgeon: Lucy Chris, MD;  Location: [facility] ORS;  Service: Neurosurgery;  Laterality: N/A;   NECK SURGERY     Upper neck surgery; with wires placed   SHOULDER SURGERY Right 11/10/2011   arthroscopic surgery . Dr. Katrinka Blazing, [facility]   TEE WITHOUT CARDIOVERSION N/A 02/11/2023   Procedure: TRANSESOPHAGEAL ECHOCARDIOGRAM;  Surgeon: Debbe Odea, MD;  Location: [facility] ORS;  Service: Cardiovascular;  Laterality: N/A;  TONSILLECTOMY     TRICEPS TENDON REPAIR Left 12/04/2019   Procedure: TRICEPS TENDON REPAIR, OLECRANON BURSA;  Surgeon: Signa Kell, MD;  [facility];  Service: Orthopedics;  Laterality: Left;    Social History   Socioeconomic History   Marital status: Married    Spouse name: Not on file   Number of children: 2   Years of education: Not on file   Highest education level: Associate degree: occupational, Scientist, product/process development, or vocational program  Occupational History   Occupation: Retired    Comment: former Programmer, systems  Tobacco Use   Smoking status: Never   Smokeless tobacco: Never  Building services engineer Use: Never used  Substance and Sexual Activity   Alcohol use: No    Alcohol/week: 0.0 standard drinks of alcohol    Drug use: No   Sexual activity: Yes  Other Topics Concern   Not on file  Social History Narrative   Not on file   Social Determinants of Health   Financial Resource Strain: Low Risk  (01/04/2023)   Overall Financial Resource Strain (CARDIA)    Difficulty of Paying Living Expenses: Not hard at all  Food Insecurity: No Food Insecurity (02/16/2023)   Hunger Vital Sign    Worried About Running Out of Food in the Last Year: Never true    Ran Out of Food in the Last Year: Never true  Transportation Needs: No Transportation Needs (02/16/2023)   PRAPARE - Administrator, Civil Service (Medical): No    Lack of Transportation (Non-Medical): No  Physical Activity: Sufficiently Active (01/04/2023)   Exercise Vital Sign    Days of Exercise per Week: 5 days    Minutes of Exercise per Session: 90 min  Stress: No Stress Concern Present (01/04/2023)   [employer] of Occupational Health - Occupational Stress Questionnaire    Feeling of Stress : Not at all  Social Connections: Moderately Integrated (01/04/2023)   Social Connection and Isolation Panel [NHANES]    Frequency of Communication with Friends and Family: More than three times a week    Frequency of Social Gatherings with Friends and Family: More than three times a week    Attends Religious Services: More than 4 times per year    Active Member of [employer] or Organizations: No    Attends Banker Meetings: Never    Marital Status: Married  Catering manager Violence: Not At Risk (02/12/2023)   Humiliation, Afraid, Rape, and Kick questionnaire    Fear of Current or Ex-Partner: No    Emotionally Abused: No    Physically Abused: No    Sexually Abused: No    Family History  Problem Relation Age of Onset   Depression Son    Diabetes Neg Hx    Cancer Neg Hx    Heart attack Neg Hx    Kidney disease Neg Hx    Prostate cancer Neg Hx    Kidney cancer Neg Hx    Bladder Cancer Neg Hx    Allergies  Allergen Reactions    Penicillins Other (See Comments)    Stomach Ache   I? Current Outpatient Medications  Medication Sig Dispense Refill   amLODipine (NORVASC) 10 MG tablet TAKE 1 TABLET BY MOUTH ONCE A DAY 90 tablet 0   aspirin 81 MG chewable tablet Chew by mouth daily.     ceFAZolin (ANCEF) IVPB Inject 6 g into the vein continuous for 23 days. Infuse cefazolin 6gm daily over 24h as  continuous infusion Indication:  MSSA bacteremia First Dose: Yes Last Day of Therapy:  03/08/2023 Labs - Once weekly:  CBC/D and CMP Please pull PIC at completion of IV antibiotics Fax weekly lab results  promptly to [phone] Method of administration: elastomeric Method of administration may be changed at the discretion of home infusion pharmacist based upon assessment of the patient and/or caregiver's ability to self-administer the medication ordered. 18 Units 0   diclofenac Sodium (VOLTAREN) 1 % GEL APPLY 4G TOPICALLY FIVE TIMES DAILY 100 g 4   fexofenadine (ALLEGRA ALLERGY) 180 MG tablet Take 1 tablet (180 mg total) by mouth daily. 10 tablet 1   finasteride (PROSCAR) 5 MG tablet Take 1 tablet (5 mg total) by mouth daily. 90 tablet 3   latanoprost (XALATAN) 0.005 % ophthalmic solution Place 1 drop into both eyes at bedtime.      mirtazapine (REMERON) 30 MG tablet TAKE 1 TABLET BY MOUTH EVERY NIGHT AT BEDTIME 30 tablet 12   mometasone (ELOCON) 0.1 % cream Apply 1 application. topically daily as needed (Rash). Qd up to 5 days a week to itchy bumps on body 45 g 1   Multiple Vitamin (MULTIVITAMIN) tablet Take 1 tablet by mouth daily.     mupirocin ointment (BACTROBAN) 2 % Apply 1 application. topically daily. Qd to open sores 22 g 3   Probiotic Product (DIGESTIVE ADVANTAGE PO) Take 1 tablet by mouth daily.     psyllium (METAMUCIL) 58.6 % powder Take 1 packet by mouth daily as needed (regularity).      rosuvastatin (CRESTOR) 20 MG tablet Take 1 tablet (20 mg total) by mouth daily. 90 tablet 3   tamsulosin (FLOMAX) 0.4 MG CAPS  capsule Take 1 capsule (0.4 mg total) by mouth daily. 90 capsule 3   timolol (TIMOPTIC) 0.5 % ophthalmic solution Place 1 drop into both eyes daily.      tiZANidine (ZANAFLEX) 4 MG tablet TAKE ONE TABLET (4 MG TOTAL) BY MOUTH EVERY EIGHT (EIGHT) HOURS AS NEEDED FOR MUSCLE SPASMS. 20 tablet 0   traMADol (ULTRAM) 50 MG tablet TAKE ONE (1) TO TWO (2) TABLETS (50-100 MG TOTAL) BY MOUTH EVERY EIGHT (EIGHT) HOURS AS NEEDED FOR UP TO 20 DAYS. 15 tablet 0   alum & mag hydroxide-simeth (MAALOX/MYLANTA) 200-200-20 MG/5ML suspension Take 30 mLs by mouth every 6 (six) hours as needed for indigestion or heartburn. (Patient not taking: Reported on 03/01/2023) 355 mL 0   ketoconazole (NIZORAL) 2 % shampoo Apply 1 Application topically 3 (three) times a week. Wash scalp 3 times a week, let sit 5 minutes and rinse out (Patient not taking: Reported on 03/01/2023) 120 mL 6   No current facility-administered medications for this visit.     Abtx:  Anti-infectives (From admission, onward)    None       REVIEW OF SYSTEMS:  Const: negative fever, negative chills, negative weight loss Eyes: negative diplopia or visual changes, negative eye pain ENT: negative coryza, negative sore throat Resp: negative cough, hemoptysis, dyspnea Cards: negative for chest pain, palpitations, lower extremity edema GU: negative for frequency, dysuria and hematuria GI: Negative for abdominal pain, diarrhea, bleeding, constipation Skin: negative for rash and pruritus Heme: negative for easy bruising and gum/nose bleeding MS: Back pain  Neurolo:negative for headaches, dizziness, vertigo, memory problems  Psych: negative for feelings of anxiety, depression  Endocrine: negative for thyroid, diabetes Allergy/Immunology-penicillin: Objective:  VITALS:  BP 119/80   Pulse 75   Temp (!) 97.4 F (36.3 C) (Oral)  LDA PICC line site clean   PHYSICAL EXAM:  General: Alert, cooperative, no distress at rest.  In a wheelchair., appears  stated age.  Head: Normocephalic, without obvious abnormality, atraumatic. Eyes: Conjunctivae clear, anicteric sclerae. Pupils are equal ENT Nares normal. No drainage or sinus tenderness. Lips, mucosa, and tongue normal. No Thrush Neck: Supple, symmetrical, no adenopathy, thyroid: non tender no carotid bruit and no JVD. Back: No CVA tenderness. Lungs: Clear to auscultation bilaterally. No Wheezing or Rhonchi. No rales. Heart: Regular rate and rhythm, no murmur, rub or gallop. Abdomen: Did not Extremities: atraumatic, no cyanosis. No edema. No clubbing Skin: No rashes or lesions. Or bruising Lymph: Cervical, supraclavicular normal. Neurologic: Not assessed, nonfocal  pertinent Labs from 02/28/2023 WBC 11.2 Hemoglobin 13.3 Platelet 394 Creatinine 0.91   ? Impression/Recommendation   Staph aureus bacteremia.  Skin wound positive for MSSA- l could have been the likely source.  Repeat blood culture became negative quickly.  2D echo no vegetation TEE could not be done due to desaturation Risk of TEE under GA outweighs benefit  With skin wound, positive MSSA, no prosthetic valves, or PPM, and cultures drawn likely from the same site indicative of falsely high bioburden, repeat BC neg  -low risk of endocarditis.  Hence TEE not pursued under general anesthesia.  MRI of the lumbar spine done on 02/08/2023 was  negative for discitis or osteomyelitis  The original plan was to give cefazolin until 03/08/2023 Will repeat MRI to look for any developing infection Will prolong the duration by 2 weeks and he will finish cefazolin now on 03/23/2023. I communicated with Dr. Myer Haff neurosurgeon.  He will see the patient after MRI.       BPH on tamsulosin and finasteride.  Had urinary retention while in the hospital and had a Foley for a few days but this was removed on discharge  W BCs around 11    History of lumbar spine fusion   B/l renal stones  nonobstructive.   MRI appointment made for  tomorrow Discussed in great detail with the patient his daughter and his wife.  Also informed home infusion company to extend the antibiotic till 03/23/2023 Discussed the management with neurosurgeon. While I am away [facility] will provide coverage for urgent issues Note:  This document was prepared using Dragon voice recognition software and may include unintentional dictation errors.

## 2023-03-01 NOTE — Telephone Encounter (Signed)
Requested medication (s) are due for refill today:routing for review  Requested medication (s) are on the active medication list: yes  Last refill:  02/07/23 and 02/25/23  Future visit scheduled: yes  Notes to clinic:  Unable to refill per protocol, cannot delegate.      Requested Prescriptions  Pending Prescriptions Disp Refills   traMADol (ULTRAM) 50 MG tablet 15 tablet 0     Not Delegated - Analgesics:  Opioid Agonists Failed - 03/01/2023  4:09 PM      Failed - This refill cannot be delegated      Passed - Urine Drug Screen completed in last 360 days      Passed - Valid encounter within last 3 months    Recent Outpatient Visits           6 days ago Hospital discharge follow-up   [facility] Jacky Kindle, FNP   1 week ago    [facility] Malva Limes, MD   3 weeks ago Acute right-sided low back pain without sciatica   [place] [facility] Malva Limes, MD   1 year ago Prostate cancer screening   [facility] Malva Limes, MD   1 year ago COVID-19   [place] [facility] Vigg, Roma Schanz, MD       Future Appointments             In 1 week Fisher, Demetrios Isaacs, MD [facility], [facility]   In 2 weeks Deirdre Evener, MD [place] [facility]   In 3 weeks Lynn Ito, MD [facility]   In 1 month Debbe Odea, MD [facility] HeartCare at [place]   In 3 months McGowan, Elana Alm [facility] [place]             tiZANidine (ZANAFLEX) 4 MG tablet 20 tablet 0     Not Delegated - Cardiovascular:  Alpha-2 Agonists - tizanidine Failed - 03/01/2023  4:09 PM      Failed - This refill cannot be delegated      Passed - Valid encounter within last 6 months    Recent Outpatient Visits           6 days ago Hospital discharge follow-up   [facility] Jacky Kindle, FNP   1 week ago    [facility] Malva Limes, MD   3 weeks ago Acute right-sided low back pain without sciatica   [facility] Malva Limes, MD   1 year ago Prostate cancer screening   [facility] Malva Limes, MD   1 year ago COVID-19    [facility] Vigg, Roma Schanz, MD       Future Appointments             In 1 week Fisher, Demetrios Isaacs, MD [facility], [facility]   In 2 weeks Deirdre Evener, MD [facility]   In 3 weeks Lynn Ito, MD [facility] Infectious Disease Center   In 1 month Agbor-Etang, Arlys John, MD [facility] HeartCare at [place]   In 3 months McGowan, Elana Alm [facility] [place]

## 2023-03-01 NOTE — Patient Instructions (Signed)
You are here for follow up of MSSA abcteremia- you are complaining of severe back pain- will check MRI spine again- will also do ESR/CRP- will refer you to the neurosurgeon Dr.Yarborough Will extend cefazolin for 2 more weeks until 03/23/23

## 2023-03-02 ENCOUNTER — Inpatient Hospital Stay
Admission: EM | Admit: 2023-03-02 | Discharge: 2023-03-08 | DRG: 871 | Disposition: A | Discharge status: Home Health | Payer: Medicare Other | Attending: Internal Medicine | Admitting: Internal Medicine

## 2023-03-02 ENCOUNTER — Encounter: Payer: Self-pay | Admitting: Physician Assistant

## 2023-03-02 ENCOUNTER — Ambulatory Visit: Payer: Self-pay | Admitting: *Deleted

## 2023-03-02 ENCOUNTER — Other Ambulatory Visit: Payer: Self-pay

## 2023-03-02 ENCOUNTER — Encounter: Payer: Self-pay | Admitting: Emergency Medicine

## 2023-03-02 ENCOUNTER — Ambulatory Visit (INDEPENDENT_AMBULATORY_CARE_PROVIDER_SITE_OTHER): Payer: Medicare Other | Admitting: Physician Assistant

## 2023-03-02 ENCOUNTER — Ambulatory Visit
Admission: RE | Admit: 2023-03-02 | Discharge: 2023-03-02 | Disposition: A | Discharge status: Home / Self Care | Payer: Medicare Other | Source: Ambulatory Visit | Attending: Infectious Diseases | Admitting: Infectious Diseases

## 2023-03-02 VITALS — BP 136/86 | HR 64 | Wt 194.0 lb

## 2023-03-02 DIAGNOSIS — M4646 Discitis, unspecified, lumbar region: Principal | ICD-10-CM

## 2023-03-02 DIAGNOSIS — M1990 Unspecified osteoarthritis, unspecified site: Secondary | ICD-10-CM | POA: Diagnosis present

## 2023-03-02 DIAGNOSIS — E785 Hyperlipidemia, unspecified: Secondary | ICD-10-CM | POA: Diagnosis present

## 2023-03-02 DIAGNOSIS — Z79899 Other long term (current) drug therapy: Secondary | ICD-10-CM

## 2023-03-02 DIAGNOSIS — I10 Essential (primary) hypertension: Secondary | ICD-10-CM | POA: Diagnosis present

## 2023-03-02 DIAGNOSIS — Z818 Family history of other mental and behavioral disorders: Secondary | ICD-10-CM

## 2023-03-02 DIAGNOSIS — Z981 Arthrodesis status: Secondary | ICD-10-CM | POA: Diagnosis not present

## 2023-03-02 DIAGNOSIS — B9561 Methicillin susceptible Staphylococcus aureus infection as the cause of diseases classified elsewhere: Secondary | ICD-10-CM | POA: Insufficient documentation

## 2023-03-02 DIAGNOSIS — M5416 Radiculopathy, lumbar region: Secondary | ICD-10-CM

## 2023-03-02 DIAGNOSIS — R7881 Bacteremia: Secondary | ICD-10-CM | POA: Insufficient documentation

## 2023-03-02 DIAGNOSIS — M545 Low back pain, unspecified: Secondary | ICD-10-CM

## 2023-03-02 DIAGNOSIS — K6812 Psoas muscle abscess: Secondary | ICD-10-CM | POA: Diagnosis present

## 2023-03-02 DIAGNOSIS — Z85828 Personal history of other malignant neoplasm of skin: Secondary | ICD-10-CM | POA: Diagnosis not present

## 2023-03-02 DIAGNOSIS — A419 Sepsis, unspecified organism: Secondary | ICD-10-CM | POA: Diagnosis present

## 2023-03-02 DIAGNOSIS — R6520 Severe sepsis without septic shock: Secondary | ICD-10-CM | POA: Diagnosis not present

## 2023-03-02 DIAGNOSIS — N401 Enlarged prostate with lower urinary tract symptoms: Secondary | ICD-10-CM | POA: Diagnosis present

## 2023-03-02 DIAGNOSIS — Z7982 Long term (current) use of aspirin: Secondary | ICD-10-CM | POA: Diagnosis not present

## 2023-03-02 DIAGNOSIS — Z09 Encounter for follow-up examination after completed treatment for conditions other than malignant neoplasm: Secondary | ICD-10-CM

## 2023-03-02 DIAGNOSIS — H409 Unspecified glaucoma: Secondary | ICD-10-CM | POA: Diagnosis present

## 2023-03-02 DIAGNOSIS — Z792 Long term (current) use of antibiotics: Secondary | ICD-10-CM | POA: Diagnosis not present

## 2023-03-02 DIAGNOSIS — R338 Other retention of urine: Secondary | ICD-10-CM | POA: Diagnosis present

## 2023-03-02 DIAGNOSIS — G062 Extradural and subdural abscess, unspecified: Secondary | ICD-10-CM | POA: Diagnosis present

## 2023-03-02 DIAGNOSIS — E876 Hypokalemia: Secondary | ICD-10-CM | POA: Diagnosis not present

## 2023-03-02 DIAGNOSIS — M869 Osteomyelitis, unspecified: Secondary | ICD-10-CM

## 2023-03-02 DIAGNOSIS — M4627 Osteomyelitis of vertebra, lumbosacral region: Secondary | ICD-10-CM | POA: Diagnosis present

## 2023-03-02 DIAGNOSIS — Z880 Allergy status to penicillin: Secondary | ICD-10-CM | POA: Diagnosis not present

## 2023-03-02 DIAGNOSIS — M5417 Radiculopathy, lumbosacral region: Secondary | ICD-10-CM | POA: Diagnosis not present

## 2023-03-02 DIAGNOSIS — M4626 Osteomyelitis of vertebra, lumbar region: Secondary | ICD-10-CM | POA: Diagnosis not present

## 2023-03-02 DIAGNOSIS — M4316 Spondylolisthesis, lumbar region: Secondary | ICD-10-CM | POA: Diagnosis not present

## 2023-03-02 HISTORY — DX: Extradural and subdural abscess, unspecified: G06.2

## 2023-03-02 LAB — COMPREHENSIVE METABOLIC PANEL
ALT: 6 U/L (ref 0–44)
AST: 14 U/L — ABNORMAL LOW (ref 15–41)
Albumin: 3 g/dL — ABNORMAL LOW (ref 3.5–5.0)
Alkaline Phosphatase: 79 U/L (ref 38–126)
Anion gap: 9 (ref 5–15)
BUN: 21 mg/dL (ref 8–23)
CO2: 25 mmol/L (ref 22–32)
Calcium: 8.7 mg/dL — ABNORMAL LOW (ref 8.9–10.3)
Chloride: 99 mmol/L (ref 98–111)
Creatinine, Ser: 0.8 mg/dL (ref 0.61–1.24)
GFR, Estimated: >60 mL/min (ref >60)
Glucose, Bld: 122 mg/dL — ABNORMAL HIGH (ref 70–99)
Potassium: 3.5 mmol/L (ref 3.5–5.1)
Sodium: 133 mmol/L — ABNORMAL LOW (ref 135–145)
Total Bilirubin: 0.7 mg/dL (ref 0.3–1.2)
Total Protein: 7.1 g/dL (ref 6.5–8.1)

## 2023-03-02 LAB — CBC WITH DIFFERENTIAL/PLATELET
Abs Immature Granulocytes: 0.06 10*3/uL (ref 0.00–0.07)
Basophils Absolute: 0.1 10*3/uL (ref 0.0–0.1)
Basophils Relative: 1 %
Eosinophils Absolute: 0.2 10*3/uL (ref 0.0–0.5)
Eosinophils Relative: 2 %
HCT: 38.8 % — ABNORMAL LOW (ref 39.0–52.0)
Hemoglobin: 12.7 g/dL — ABNORMAL LOW (ref 13.0–17.0)
Immature Granulocytes: 0 %
Lymphocytes Relative: 27 %
Lymphs Abs: 4 10*3/uL (ref 0.7–4.0)
MCH: 30.5 pg (ref 26.0–34.0)
MCHC: 32.7 g/dL (ref 30.0–36.0)
MCV: 93.3 fL (ref 80.0–100.0)
Monocytes Absolute: 1.9 10*3/uL — ABNORMAL HIGH (ref 0.1–1.0)
Monocytes Relative: 13 %
Neutro Abs: 8.5 10*3/uL — ABNORMAL HIGH (ref 1.7–7.7)
Neutrophils Relative %: 57 %
Platelets: 308 10*3/uL (ref 150–400)
RBC: 4.16 MIL/uL — ABNORMAL LOW (ref 4.22–5.81)
RDW: 13.3 % (ref 11.5–15.5)
WBC: 14.7 10*3/uL — ABNORMAL HIGH (ref 4.0–10.5)
nRBC: 0 % (ref 0.0–0.2)

## 2023-03-02 LAB — PROTIME-INR
INR: 1.1 (ref 0.8–1.2)
Prothrombin Time: 14.4 seconds (ref 11.4–15.2)

## 2023-03-02 LAB — TROPONIN I (HIGH SENSITIVITY): Troponin I (High Sensitivity): 8 ng/L (ref <18)

## 2023-03-02 LAB — SEDIMENTATION RATE: Sed Rate: 43 mm/hr — ABNORMAL HIGH (ref 0–20)

## 2023-03-02 LAB — APTT: aPTT: 32 seconds (ref 24–36)

## 2023-03-02 LAB — LACTIC ACID, PLASMA: Lactic Acid, Venous: 0.6 mmol/L (ref 0.5–1.9)

## 2023-03-02 MED ORDER — SODIUM CHLORIDE 0.9 % IV BOLUS (SEPSIS)
1000.0000 mL | Freq: Once | INTRAVENOUS | Status: AC
  Administered 2023-03-02: 1000 mL via INTRAVENOUS

## 2023-03-02 MED ORDER — ONDANSETRON HCL 4 MG/2ML IJ SOLN
4.0000 mg | Freq: Four times a day (QID) | INTRAMUSCULAR | Status: DC | PRN
  Administered 2023-03-02: 4 mg via INTRAVENOUS
  Filled 2023-03-02 (×2): qty 2

## 2023-03-02 MED ORDER — VANCOMYCIN HCL IN DEXTROSE 1-5 GM/200ML-% IV SOLN: 1000.0000 mg | Freq: Once | INTRAVENOUS | Status: DC

## 2023-03-02 MED ORDER — TIZANIDINE HCL 4 MG PO TABS: ORAL_TABLET | ORAL | 0 refills | Status: DC

## 2023-03-02 MED ORDER — PSYLLIUM 95 % PO PACK
1.0000 | PACK | Freq: Every day | ORAL | Status: DC
  Administered 2023-03-04 – 2023-03-08 (×5): 1 via ORAL
  Filled 2023-03-02 (×7): qty 1

## 2023-03-02 MED ORDER — TRAMADOL HCL 50 MG PO TABS: ORAL_TABLET | ORAL | 0 refills | Status: DC

## 2023-03-02 MED ORDER — GABAPENTIN 300 MG PO CAPS
300.0000 mg | ORAL_CAPSULE | Freq: Every evening | ORAL | Status: DC | PRN
  Administered 2023-03-03 – 2023-03-06 (×3): 300 mg via ORAL
  Filled 2023-03-02 (×3): qty 1

## 2023-03-02 MED ORDER — TIMOLOL MALEATE 0.5 % OP SOLN
1.0000 [drp] | Freq: Every day | OPHTHALMIC | Status: DC
  Administered 2023-03-06 – 2023-03-08 (×3): 1 [drp] via OPHTHALMIC
  Filled 2023-03-02: qty 5

## 2023-03-02 MED ORDER — SODIUM CHLORIDE 0.9 % IV SOLN
2.0000 g | Freq: Once | INTRAVENOUS | Status: AC
  Administered 2023-03-02: 2 g via INTRAVENOUS
  Filled 2023-03-02: qty 20

## 2023-03-02 MED ORDER — SODIUM CHLORIDE 0.9 % IV SOLN
2.0000 g | INTRAVENOUS | Status: DC
  Filled 2023-03-02: qty 20

## 2023-03-02 MED ORDER — ACETAMINOPHEN 650 MG RE SUPP: 650.0000 mg | Freq: Four times a day (QID) | RECTAL | Status: DC | PRN

## 2023-03-02 MED ORDER — SODIUM CHLORIDE 0.9 % IV SOLN: INTRAVENOUS | Status: AC

## 2023-03-02 MED ORDER — VANCOMYCIN HCL 2000 MG/400ML IV SOLN
2000.0000 mg | Freq: Once | INTRAVENOUS | Status: AC
  Administered 2023-03-02: 2000 mg via INTRAVENOUS
  Filled 2023-03-02: qty 400

## 2023-03-02 MED ORDER — MORPHINE SULFATE (PF) 4 MG/ML IV SOLN
4.0000 mg | Freq: Once | INTRAVENOUS | Status: AC
  Administered 2023-03-02: 4 mg via INTRAVENOUS
  Filled 2023-03-02: qty 1

## 2023-03-02 MED ORDER — ADULT MULTIVITAMIN W/MINERALS CH
1.0000 | ORAL_TABLET | Freq: Every day | ORAL | Status: DC
  Administered 2023-03-04 – 2023-03-08 (×5): 1 via ORAL
  Filled 2023-03-02 (×5): qty 1

## 2023-03-02 MED ORDER — RISAQUAD PO CAPS
1.0000 | ORAL_CAPSULE | Freq: Every day | ORAL | Status: DC
  Administered 2023-03-04 – 2023-03-08 (×5): 1 via ORAL
  Filled 2023-03-02 (×5): qty 1

## 2023-03-02 MED ORDER — GADOBUTROL 1 MMOL/ML IV SOLN
8.0000 mL | Freq: Once | INTRAVENOUS | Status: AC | PRN
  Administered 2023-03-02: 8 mL via INTRAVENOUS

## 2023-03-02 MED ORDER — ROSUVASTATIN CALCIUM 10 MG PO TABS
20.0000 mg | ORAL_TABLET | Freq: Every day | ORAL | Status: DC
  Administered 2023-03-04 – 2023-03-08 (×5): 20 mg via ORAL
  Filled 2023-03-02 (×5): qty 2
  Filled 2023-03-02: qty 1

## 2023-03-02 MED ORDER — FINASTERIDE 5 MG PO TABS
5.0000 mg | ORAL_TABLET | Freq: Every day | ORAL | Status: DC
  Administered 2023-03-04 – 2023-03-08 (×5): 5 mg via ORAL
  Filled 2023-03-02 (×6): qty 1

## 2023-03-02 MED ORDER — TRAMADOL HCL 50 MG PO TABS
50.0000 mg | ORAL_TABLET | Freq: Three times a day (TID) | ORAL | 0 refills | Status: DC
Start: 2023-03-02 — End: 2023-03-16

## 2023-03-02 MED ORDER — ALBUTEROL SULFATE (2.5 MG/3ML) 0.083% IN NEBU: 2.5000 mg | INHALATION_SOLUTION | RESPIRATORY_TRACT | Status: DC | PRN

## 2023-03-02 MED ORDER — OXYCODONE-ACETAMINOPHEN 5-325 MG PO TABS
1.0000 | ORAL_TABLET | Freq: Four times a day (QID) | ORAL | Status: DC | PRN
  Administered 2023-03-02 – 2023-03-07 (×10): 1 via ORAL
  Filled 2023-03-02 (×10): qty 1

## 2023-03-02 MED ORDER — LATANOPROST 0.005 % OP SOLN
1.0000 [drp] | Freq: Every day | OPHTHALMIC | Status: DC
  Administered 2023-03-03 – 2023-03-07 (×4): 1 [drp] via OPHTHALMIC
  Filled 2023-03-02 (×2): qty 2.5

## 2023-03-02 MED ORDER — ONDANSETRON HCL 4 MG PO TABS: 4.0000 mg | ORAL_TABLET | Freq: Four times a day (QID) | ORAL | Status: DC | PRN

## 2023-03-02 MED ORDER — MORPHINE SULFATE (PF) 2 MG/ML IV SOLN
2.0000 mg | INTRAVENOUS | Status: DC | PRN
  Administered 2023-03-03: 2 mg via INTRAVENOUS
  Filled 2023-03-02: qty 1

## 2023-03-02 MED ORDER — VANCOMYCIN HCL 1250 MG/250ML IV SOLN
1250.0000 mg | Freq: Two times a day (BID) | INTRAVENOUS | Status: DC
  Administered 2023-03-03: 1250 mg via INTRAVENOUS
  Filled 2023-03-02: qty 250

## 2023-03-02 MED ORDER — AMLODIPINE BESYLATE 10 MG PO TABS
10.0000 mg | ORAL_TABLET | Freq: Every day | ORAL | Status: DC
  Administered 2023-03-04 – 2023-03-08 (×5): 10 mg via ORAL
  Filled 2023-03-02 (×5): qty 1

## 2023-03-02 MED ORDER — GABAPENTIN 300 MG PO CAPS
300.0000 mg | ORAL_CAPSULE | Freq: Every evening | ORAL | 0 refills | Status: DC | PRN
Start: 2023-03-02 — End: 2023-03-25

## 2023-03-02 MED ORDER — TRAMADOL HCL 50 MG PO TABS
50.0000 mg | ORAL_TABLET | Freq: Three times a day (TID) | ORAL | Status: DC
  Administered 2023-03-03 – 2023-03-08 (×17): 50 mg via ORAL
  Filled 2023-03-02 (×17): qty 1

## 2023-03-02 MED ORDER — ONDANSETRON HCL 4 MG/2ML IJ SOLN
4.0000 mg | Freq: Four times a day (QID) | INTRAMUSCULAR | Status: DC | PRN
  Administered 2023-03-04: 4 mg via INTRAVENOUS
  Filled 2023-03-02: qty 2

## 2023-03-02 MED ORDER — ACETAMINOPHEN 325 MG PO TABS
650.0000 mg | ORAL_TABLET | Freq: Four times a day (QID) | ORAL | Status: DC | PRN
  Administered 2023-03-07 – 2023-03-08 (×3): 650 mg via ORAL
  Filled 2023-03-02 (×3): qty 2

## 2023-03-02 MED ORDER — TAMSULOSIN HCL 0.4 MG PO CAPS
0.4000 mg | ORAL_CAPSULE | Freq: Every day | ORAL | Status: DC
  Administered 2023-03-04 – 2023-03-08 (×5): 0.4 mg via ORAL
  Filled 2023-03-02 (×5): qty 1

## 2023-03-02 NOTE — ED Notes (Signed)
Hospitalist at bedside 

## 2023-03-02 NOTE — Progress Notes (Signed)
I,Brett Bender,acting as a Neurosurgeon for [employer], PA-C.,have documented all relevant documentation on the behalf of Brett Ferguson, PA-C,as directed by  Brett Ferguson, PA-C while in the presence of Brett Ferguson, PA-C.   Established patient visit   Patient: Brett Bender   DOB: May 28, 1946   77 y.o. Male  MRN: [id number] Visit Date: 03/02/2023  Today's healthcare provider: [name], PA-C   Cc. Chronic back pain  Subjective     Pt was admitted early this month with sepsis, accidental overdose of pain medication.   He has ongoing back pain that he has previously treated with various medications.  He is seeing ID and has an MRI scheduled today, with pending appts with neurosurgery.   Currently his pain is uncontrolled with tramadol and tizanidine.   Medications: Outpatient Medications Prior to Visit  Medication Sig   amLODipine (NORVASC) 10 MG tablet TAKE 1 TABLET BY MOUTH ONCE A DAY   aspirin 81 MG chewable tablet Chew by mouth daily.   ceFAZolin (ANCEF) IVPB Inject 6 g into the vein continuous for 23 days. Infuse cefazolin 6gm daily over 24h as continuous infusion Indication:  MSSA bacteremia First Dose: Yes Last Day of Therapy:  03/08/2023 Labs - Once weekly:  CBC/D and CMP Please pull PIC at completion of IV antibiotics Fax weekly lab results  promptly to [phone] Method of administration: elastomeric Method of administration may be changed at the discretion of home infusion pharmacist based upon assessment of the patient and/or caregiver's ability to self-administer the medication ordered.   diclofenac Sodium (VOLTAREN) 1 % GEL APPLY 4G TOPICALLY FIVE TIMES DAILY   fexofenadine (ALLEGRA ALLERGY) 180 MG tablet Take 1 tablet (180 mg total) by mouth daily.   finasteride (PROSCAR) 5 MG tablet Take 1 tablet (5 mg total) by mouth daily.   latanoprost (XALATAN) 0.005 % ophthalmic solution Place 1 drop into both eyes at bedtime.    mirtazapine (REMERON) 30  MG tablet TAKE 1 TABLET BY MOUTH EVERY NIGHT AT BEDTIME   mometasone (ELOCON) 0.1 % cream Apply 1 application. topically daily as needed (Rash). Qd up to 5 days a week to itchy bumps on body   Multiple Vitamin (MULTIVITAMIN) tablet Take 1 tablet by mouth daily.   mupirocin ointment (BACTROBAN) 2 % Apply 1 application. topically daily. Qd to open sores   Probiotic Product (DIGESTIVE ADVANTAGE PO) Take 1 tablet by mouth daily.   psyllium (METAMUCIL) 58.6 % powder Take 1 packet by mouth daily as needed (regularity).    rosuvastatin (CRESTOR) 20 MG tablet Take 1 tablet (20 mg total) by mouth daily.   tamsulosin (FLOMAX) 0.4 MG CAPS capsule Take 1 capsule (0.4 mg total) by mouth daily.   timolol (TIMOPTIC) 0.5 % ophthalmic solution Place 1 drop into both eyes daily.    tiZANidine (ZANAFLEX) 4 MG tablet TAKE ONE TABLET (4 MG TOTAL) BY MOUTH EVERY EIGHT (EIGHT) HOURS AS NEEDED FOR MUSCLE SPASMS.   [DISCONTINUED] alum & mag hydroxide-simeth (MAALOX/MYLANTA) 200-200-20 MG/5ML suspension Take 30 mLs by mouth every 6 (six) hours as needed for indigestion or heartburn. (Patient not taking: Reported on 03/01/2023)   [DISCONTINUED] ketoconazole (NIZORAL) 2 % shampoo Apply 1 Application topically 3 (three) times a week. Wash scalp 3 times a week, let sit 5 minutes and rinse out (Patient not taking: Reported on 03/01/2023)   [DISCONTINUED] traMADol (ULTRAM) 50 MG tablet TAKE ONE (1) TO TWO (2) TABLETS (50-100 MG TOTAL) BY MOUTH EVERY EIGHT (EIGHT) HOURS AS NEEDED  FOR UP TO 20 DAYS.   No facility-administered medications prior to visit.    Review of Systems  Genitourinary:  Positive for dysuria.  Musculoskeletal:  Positive for back pain.     Objective    BP 136/86 (BP Location: Left Arm, Patient Position: Sitting, Cuff Size: Normal)   Pulse 64   Wt 194 lb (88 kg)   SpO2 100%   BMI 22.42 kg/m    Physical Exam Vitals reviewed.  Constitutional:      Appearance: He is not ill-appearing.  HENT:     Head:  Normocephalic.  Eyes:     Conjunctiva/sclera: Conjunctivae normal.  Cardiovascular:     Rate and Rhythm: Normal rate.  Pulmonary:     Effort: Pulmonary effort is normal. No respiratory distress.  Neurological:     General: No focal deficit present.     Mental Status: He is alert and oriented to person, place, and time.  Psychiatric:        Mood and Affect: Mood normal.        Behavior: Behavior normal.     No results found for any visits on 03/02/23.  Assessment & Plan     Problem List Items Addressed This Visit       Nervous and Auditory   Lumbar radiculopathy - Primary    Recent MR: IMPRESSION: 1. No evidence of discitis or osteomyelitis. No epidural collection. 2. L4-L5 moderate spinal canal stenosis, which has progressed from the prior exam. 3. L5-S1 mild spinal canal stenosis and moderate right neural foraminal narrowing, which has progressed from the prior exam. 4. L3-L4 moderate left-greater-than-right neural foraminal narrowing. 5. L2-L3 mild spinal canal stenosis and mild bilateral neural foraminal narrowing. 6. Narrowing of the lateral recesses at L2-L3, L3-L4, L4-L5, and L5-S1 could affect the descending L3, L4, L5, and S1 nerve roots, respectively.  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  Changing his dosing: tramadol 50 mg in AM and 8 hours later. In evening rx gabapenin 300 mg. NO tramadol in evening and to d/c the tizanidine for now. 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 I would like to defer to neurosurgery for pain management in the future or at least with his PCP instead of multiple providers from several locations.      Relevant Medications   gabapentin (NEURONTIN) 300 MG capsule   traMADol (ULTRAM) 50 MG tablet     Return if symptoms worsen  or fail to improve, w/ pcp.      I, Brett Ferguson, PA-C have reviewed all documentation for this visit. The documentation on  03/02/23  for the exam, diagnosis, procedures, and orders are all accurate and complete.  Brett Ferguson, PA-C [facility] [address] [place], [place], [address] Office: [phone] Fax: [phone]   [facility]

## 2023-03-02 NOTE — Progress Notes (Signed)
CODE SEPSIS - PHARMACY COMMUNICATION  **Broad Spectrum Antibiotics should be administered within 1 hour of Sepsis diagnosis**  Time Code Sepsis Called/Page Received: 4/24 @ 2135  Antibiotics Ordered: Vanc, Ceftriaxone   Time of 1st antibiotic administration: Ceftriaxone 2 gm IV X 1 on 4/24 @ 2152  Additional action taken by pharmacy:   If necessary, Name of Provider/Nurse Contacted:     Lakshmi Sundeen D ,PharmD Clinical Pharmacist  03/02/2023  9:54 PM

## 2023-03-02 NOTE — Consult Note (Signed)
Case reviewed with Dr. Vicente Males. Full note to follow.  Patient with known MSSA bacteremia with recent increase in back pain.  He had MRI today which notes new development of discitis osteomyelitis.  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  Per ER, has no neurologic deficits currently and is able to urinate.  Plan:  - admit to medicine for workup and management - ESR/CRP blood cultures - NPO overnight - would broaden antibiotic coverage to include MRSA and GNRs given new development of discitis - Would consider ID  - Would consult IR for biopsy/aspiration of disc space - Will consider washout if he develops any new neurologic deficit  Venetia Night MD

## 2023-03-02 NOTE — ED Triage Notes (Signed)
Pt in via POV, sent over from outpatient MRI.  Per patient report, an infection was seen in his back.  Results called over to EDP.    Patient recently hospitalized w/ bacterial blood infection, is currently receiving 24hr antibiotic therapy via R midline that in place upon arrival.  States pump is changed out daily.    Patient does report ongoing lower back pain that has actually worsened since being discharged from the hospital, which is why MRI was scheduled for today.  Patient A/Ox4, NAD noted at this time.

## 2023-03-02 NOTE — ED Notes (Signed)
Patient requesting pain medication.  MD Maisie Fus made aware.

## 2023-03-02 NOTE — ED Provider Notes (Addendum)
[facility] Provider Note   Event Date/Time   First MD Initiated Contact with Patient 03/02/23 2052     (approximate) History  Back Pain  HPI HORACE LUKAS is a 77 y.o. male with recent diagnosis of MSSA bacteremia on cefazolin through PICC line who presents complaining of worsening back pain along the lower lumbar spine.  Patient states that he was given an MRI evaluation today and was told to present to the emergency department for concerning findings.  Patient states that these findings were conveyed to an emergency department provider.  Patient states that he is also having difficulty with urination but has been able to pass urine as well as having worsening pain upon standing and walking. ROS: Patient currently denies any vision changes, tinnitus, difficulty speaking, facial droop, sore throat, chest pain, shortness of breath, abdominal pain, nausea/vomiting/diarrhea, dysuria, or weakness/numbness/paresthesias in any extremity   Physical Exam  Triage Vital Signs: ED Triage Vitals  Enc Vitals Group     BP --      Pulse --      Resp --      Temp --      Temp src --      SpO2 --      Weight 03/02/23 2103 194 lb (88 kg)     Height 03/02/23 2103  (1.905 m)     Head Circumference --      Peak Flow --      Pain Score 03/02/23 2102 5     Pain Loc --      Pain Edu? --      Excl. in GC? --    Most recent vital signs: Vitals:   03/02/23 2110  BP: 132/77  Pulse: 66  Resp: 16  Temp: 98.3 F (36.8 C)  SpO2: 97%   General: Awake, oriented x4. CV:  Good peripheral perfusion.  Resp:  Normal effort.  Abd:  No distention.  Other:  Elderly Caucasian male laying in bed in mild distress secondary to pain ED Results / Procedures / Treatments  Labs (all labs ordered are listed, but only abnormal results are displayed) Labs Reviewed  COMPREHENSIVE METABOLIC PANEL - Abnormal; Notable for the following components:      Result Value   Sodium 133 (*)     Glucose, Bld 122 (*)    Calcium 8.7 (*)    Albumin 3.0 (*)    AST 14 (*)    All other components within normal limits  CBC WITH DIFFERENTIAL/PLATELET - Abnormal; Notable for the following components:   WBC 14.7 (*)    RBC 4.16 (*)    Hemoglobin 12.7 (*)    HCT 38.8 (*)    Neutro Abs 8.5 (*)    Monocytes Absolute 1.9 (*)    All other components within normal limits  SEDIMENTATION RATE - Abnormal; Notable for the following components:   Sed Rate 43 (*)    All other components within normal limits  CULTURE, BLOOD (ROUTINE X 2)  CULTURE, BLOOD (ROUTINE X 2)  LACTIC ACID, PLASMA  PROTIME-INR  APTT  LACTIC ACID, PLASMA  C-REACTIVE PROTEIN  TROPONIN I (HIGH SENSITIVITY)  TROPONIN I (HIGH SENSITIVITY)    PROCEDURES: Critical Care performed: No .1-3 Lead EKG Interpretation  Performed by: Merwyn Katos, MD Authorized by: Merwyn Katos, MD     Interpretation: normal     ECG rate:  71   ECG rate assessment: normal     Rhythm: sinus rhythm  Ectopy: none     Conduction: normal    MEDICATIONS ORDERED IN ED: Medications  ondansetron (ZOFRAN) injection 4 mg (4 mg Intravenous Given 03/02/23 2119)  vancomycin (VANCOREADY) IVPB 2000 mg/400 mL (2,000 mg Intravenous New Bag/Given 03/02/23 2241)  cefTRIAXone (ROCEPHIN) 2 g in sodium chloride 0.9 % 100 mL IVPB (has no administration in time range)  vancomycin (VANCOREADY) IVPB 1250 mg/250 mL (has no administration in time range)  morphine (PF) 4 MG/ML injection 4 mg (4 mg Intravenous Given 03/02/23 2119)  cefTRIAXone (ROCEPHIN) 2 g in sodium chloride 0.9 % 100 mL IVPB (0 g Intravenous Stopped 03/02/23 2224)  sodium chloride 0.9 % bolus 1,000 mL (0 mLs Intravenous Stopped 03/02/23 2301)   IMPRESSION / MDM / ASSESSMENT AND PLAN / ED COURSE  I reviewed the triage vital signs and the nursing notes.                             The patient is on the cardiac monitor to evaluate for evidence of arrhythmia and/or significant heart rate  changes. Patient's presentation is most consistent with acute presentation with potential threat to life or bodily function. Patient is a 77 year old male who presents for possible bony infection of of the lower spine with MRI evidence Given history, exam, and workup I have low suspicion for necrotizing fasciitis, abscess, DVT, or other emergent problem as a cause for this presentation  Interventions: Vancomycin, Rocephin Consults: Hospitalist, neurosurg Dispo: Admit   FINAL CLINICAL IMPRESSION(S) / ED DIAGNOSES   Final diagnoses:  Discitis of lumbar region  Epidural abscess  Acute bilateral low back pain without sciatica   Rx / DC Orders   ED Discharge Orders     None      Note:  This document was prepared using Dragon voice recognition software and may include unintentional dictation errors.   Merwyn Katos, MD 03/02/23 1610    Merwyn Katos, MD 03/02/23 2322

## 2023-03-02 NOTE — Patient Instructions (Addendum)
Tramadol 50 mg in the morning and then 8 hours later.  Gabapentin 300 mg before bed.   No muscle relaxant for now; no tizanidine.

## 2023-03-02 NOTE — Telephone Encounter (Signed)
  Chief Complaint: back pain- lower midline Symptoms: recent bacteremia- back pain not better Frequency: ongoing with the infection- but feels has not been addressed Pertinent Negatives: Patient denies fever, abdomen pain, burning with urination, blood in urine Disposition: ED /[] Urgent Care (no appt availability in office) / Appointment(In office/virtual)/  [facility]/ Home Care/ Refused Recommended Disposition /[] [facility]/  Follow-up with PCP Additional Notes: High risk patient- recent bacteremia with back pain that is not improving- appointment scheduled  Reason for Disposition  Soft tissue infection (e.g., abscess, cellulitis) or other serious infection (e.g., bacteremia) in last 2 weeks  Answer Assessment - Initial Assessment Questions 1. ONSET: "When did the pain begin?"      3 weeks ago- did not sleep at all last night- worse over past 2 days 2. LOCATION: "Where does it hurt?" (upper, mid or lower back)     Lower back- midline-R hip 3. SEVERITY: "How bad is the pain?"  (e.g., Scale 1-10; mild, moderate, or severe)   - MILD (1-3): Doesn't interfere with normal activities.    - MODERATE (4-7): Interferes with normal activities or awakens from sleep.    - SEVERE (8-10): Excruciating pain, unable to do any normal activities.      Moderate 4. PATTERN: "Is the pain constant?" (e.g., yes, no; constant, intermittent)      constant 5. RADIATION: "Does the pain shoot into your legs or somewhere else?"     No radiation 6. CAUSE:  "What do you think is causing the back pain?"      Patient was having back pain during hospital stay- they on treated it 7. BACK OVERUSE:  "Any recent lifting of heavy objects, strenuous work or exercise?"     no 8. MEDICINES: "What have you taken so far for the pain?" (e.g., nothing, acetaminophen, NSAIDS)     Tramadol 9. NEUROLOGIC SYMPTOMS: "Do you have any weakness, numbness, or problems with bowel/bladder control?"      Pain made it hard to urinate last night 10. OTHER SYMPTOMS: "Do you have any other symptoms?" (e.g., fever, abdomen pain, burning with urination, blood in urine)       no  Protocols used: Back Pain-A-AH

## 2023-03-02 NOTE — ED Notes (Signed)
EDP at bedside  

## 2023-03-02 NOTE — Assessment & Plan Note (Addendum)
Recent MR: IMPRESSION: 1. No evidence of discitis or osteomyelitis. No epidural collection. 2. L4-L5 moderate spinal canal stenosis, which has progressed from the prior exam. 3. L5-S1 mild spinal canal stenosis and moderate right neural foraminal narrowing, which has progressed from the prior exam. 4. L3-L4 moderate left-greater-than-right neural foraminal narrowing. 5. L2-L3 mild spinal canal stenosis and mild bilateral neural foraminal narrowing. 6. Narrowing of the lateral recesses at L2-L3, L3-L4, L4-L5, and L5-S1 could affect the descending L3, L4, L5, and S1 nerve roots, respectively.  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  Changing his dosing: tramadol 50 mg in AM and 8 hours later. In evening rx gabapenin 300 mg. NO tramadol in evening and to d/c the tizanidine for now. 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 I would like to defer to neurosurgery for pain management in the future or at least with his PCP instead of multiple providers from several locations.

## 2023-03-02 NOTE — ED Notes (Signed)
Per ED MD Bradler, continuous abx running into midline stopped.  Midline capped off and flushed using aseptic technique.

## 2023-03-02 NOTE — ED Notes (Signed)
Patient walked to toilet and urinated.  Patient reports it still feels like he has to urinate.  Bladder scan revealed greater than 306 mL.  MD Maisie Fus made aware via secure chat.

## 2023-03-02 NOTE — H&P (Addendum)
History and Physical    Librado L Snapp WGN:[id number] DSOCORRO [id number] DOA: 03/02/2023  PCP: Malva Limes, MD  Patient coming from: home  I have personally briefly reviewed patient's old medical records in [facility]  Chief Complaint: severe back pain progressive   HPI: Brett Bender is a 77 y.o. male with medical history significant of  renal stones, BCC, BPH, djd of the spine(history of lumbar surgery L3-L4 decompression done by Dr. Adriana Simas in 2019,lumbar fusion in 2010, as well as cervical spine surgery.) , depression, hypertension with recent interim history of admission 02/07/2023 with discharge 02/13/2023 with diagnosis of sepsis due to MSSA bacteremia  from skin wounds,as well as initially presenting with accidental opiate overdose. Patient was discharged with cefazolin iv , recent f/u with ID rec stop date 03/23/2023.  Patient present to ED after MRI ordered after seen ID on 4/23 due to progressive lower back pain. Out patient MRI completed today noted epidural abscess. Patient presents for further treatment and neurosurgery care.  Patient noted difficulty with urination however has  had this problem since hospitalization and this was noted to be related to history of BPH.  Patient notes no fever/chills / n/v/d/ diarrhea, weakness in lower extremities , bladder or bowel incontinence or saddle anesthesia. He notes paresthesia in right lower extremity but this is unchanged since last hospitalization.   ED Course:  Vitals: afeb, bp 132/77, hr 66, rr 16 , sat 97%  on ra Wbc: 14.7, hgb 12.7 , plt 308  Lactic 0.6 INR 1.1  NA 133, K 3.5, glu 122 cr 0.8 AST 14 CE 8 Sed 43 Snr:62  LAD  Imaging  IMPRESSION: 1. 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 Associated intradural component as above. 2. Associated paraspinous inflammatory changes with left  psoas abscesses as above. 3. Underlying multilevel degenerative spondylosis and facet arthrosis, most pronounced at L4-5 where there is resultant moderate to severe spinal stenosis. Tx :CTX 2gram  , NS 1L,  vanc ivph ctx 2gram  Review of Systems: As per HPI otherwise 10 point review of systems negative.   Past Medical History:  Diagnosis Date   Actinic keratosis    Arthritis    Basal cell carcinoma 11/09/2013   R lateral edge of clavicle   Basal cell carcinoma 02/03/2016   L lateral supraclavicular base of neck/excision   Basal cell carcinoma 11/29/2018   L upper back   Basal cell carcinoma 09/11/2020   R mid back    Cancer    Basal cell carcinoma bilateral arms and left shoulder   Complication of anesthesia    slow to wake up with shoulder surgery   Depression    Enlarged prostate    H/O elbow surgery    left   Herpes simplex    History of kidney stones    History of MRSA infection 2004   skin around eye   Hypertension    Kidney stone    Lumbar stenosis    Migraine headache    Motion sickness    ocean boats   PONV (postoperative nausea and vomiting)    Tendonitis of ankle, left     Past Surgical History:  Procedure Laterality Date   APPENDECTOMY     back injection     BACK SURGERY     CYSTOSCOPY/URETEROSCOPY/HOLMIUM LASER/STENT PLACEMENT Left 12/18/2015   Procedure: CYSTOSCOPY/URETEROSCOPY/HOLMIUM LASER LITHOTRIPSY;  Surgeon: Vanna Scotland, MD;  Location: [facility];  Service: Urology;  Laterality: Left;   ELBOW SURGERY     LOWER BACK SURGERY  2010   L4-5 fusion   LUMBAR LAMINECTOMY/DECOMPRESSION MICRODISCECTOMY N/A 08/21/2018   Procedure: LUMBAR LAMINECTOMY/DECOMPRESSION MICRODISCECTOMY 1 LEVEL-L3/4;  Surgeon: Lucy Chris, MD;  Location: [facility];  Service: Neurosurgery;  Laterality: N/A;   NECK SURGERY     Upper neck surgery; with wires placed   SHOULDER SURGERY Right 11/10/2011   arthroscopic surgery . Dr. Katrinka Blazing, [facility]   TEE WITHOUT CARDIOVERSION N/A  02/11/2023   Procedure: TRANSESOPHAGEAL ECHOCARDIOGRAM;  Surgeon: Debbe Odea, MD;  Location: [facility];  Service: Cardiovascular;  Laterality: N/A;   TONSILLECTOMY     TRICEPS TENDON REPAIR Left 12/04/2019   Procedure: TRICEPS TENDON REPAIR, OLECRANON BURSA;  Surgeon: Signa Kell, MD;  Location: [facility];  Service: Orthopedics;  Laterality: Left;     reports that he has never smoked. He has never used smokeless tobacco. He reports that he does not drink alcohol and does not use drugs.  Allergies  Allergen Reactions   Penicillins Other (See Comments)    Stomach Ache    Family History  Problem Relation Age of Onset   Depression Son    Diabetes Neg Hx    Cancer Neg Hx    Heart attack Neg Hx    Kidney disease Neg Hx    Prostate cancer Neg Hx    Kidney cancer Neg Hx    Bladder Cancer Neg Hx     Prior to Admission medications   Medication Sig Start Date End Date Taking? Authorizing Provider  amLODipine (NORVASC) 10 MG tablet TAKE 1 TABLET BY MOUTH ONCE A DAY 12/21/22   Malva Limes, MD  aspirin 81 MG chewable tablet Chew by mouth daily.    [provider]  ceFAZolin (ANCEF) IVPB Inject 6 g into the vein continuous for 23 days. Infuse cefazolin 6gm daily over 24h as continuous infusion Indication:  MSSA bacteremia First Dose: Yes Last Day of Therapy:  03/08/2023 Labs - Once weekly:  CBC/D and CMP Please pull PIC at completion of IV antibiotics Fax weekly lab results  promptly to [phone] Method of administration: elastomeric Method of administration may be changed at the discretion of home infusion pharmacist based upon assessment of the patient and/or caregiver's ability to self-administer the medication ordered. 02/13/23 03/08/23  Sunnie Nielsen, DO  diclofenac Sodium (VOLTAREN) 1 % GEL APPLY 4G TOPICALLY FIVE TIMES DAILY 03/24/22   Malva Limes, MD  fexofenadine [facility] ALLERGY) 180 MG tablet Take 1 tablet (180 mg total) by mouth daily.  05/28/21   Vigg, Avanti, MD  finasteride (PROSCAR) 5 MG tablet Take 1 tablet (5 mg total) by mouth daily. 06/09/22   Michiel Cowboy A, PA-C  gabapentin (NEURONTIN) 300 MG capsule Take 1 capsule (300 mg total) by mouth at bedtime as needed. 03/02/23   [name], PA-C  latanoprost (XALATAN) 0.005 % ophthalmic solution Place 1 drop into both eyes at bedtime.  12/05/18   [provider]  mirtazapine (REMERON) 30 MG tablet TAKE 1 TABLET BY MOUTH EVERY NIGHT AT BEDTIME 05/27/22   Ostwalt, Janna, PA-C  mometasone (ELOCON) 0.1 % cream Apply 1 application. topically daily as needed (Rash). Qd up to 5 days a week to itchy bumps on body 01/20/22   Deirdre Evener, MD  Multiple Vitamin (MULTIVITAMIN) tablet Take 1 tablet by mouth daily.    [provider]  mupirocin ointment (BACTROBAN) 2 % Apply 1 application.  topically daily. Qd to open sores 01/20/22   Deirdre Evener, MD  Probiotic Product (DIGESTIVE ADVANTAGE PO) Take 1 tablet by mouth daily.    [provider]  psyllium (METAMUCIL) 58.6 % powder Take 1 packet by mouth daily as needed (regularity).     [provider]  rosuvastatin (CRESTOR) 20 MG tablet Take 1 tablet (20 mg total) by mouth daily. 02/24/23   Jacky Kindle, FNP  tamsulosin (FLOMAX) 0.4 MG CAPS capsule Take 1 capsule (0.4 mg total) by mouth daily. 06/09/22   Michiel Cowboy A, PA-C  timolol (TIMOPTIC) 0.5 % ophthalmic solution Place 1 drop into both eyes daily.  07/04/19   [provider]  tiZANidine (ZANAFLEX) 4 MG tablet TAKE ONE TABLET (4 MG TOTAL) BY MOUTH EVERY EIGHT (EIGHT) HOURS AS NEEDED FOR MUSCLE SPASMS. 03/02/23   Malva Limes, MD  traMADol (ULTRAM) 50 MG tablet Take 1 tablet (50 mg total) by mouth every 8 (eight) hours. Take 1 tablet in the morning and then another 8 hours later. Do not take with gabapentin or tizanidine 03/02/23   [name], PA-C    Physical Exam: Vitals:   03/02/23 2103 03/02/23 2110  BP:  132/77   Pulse:  66  Resp:  16  Temp:  98.3 F (36.8 C)  TempSrc:  Oral  SpO2:  97%  Weight: 88 kg   Height:  (1.905 m)     Constitutional: NAD, calm, comfortable Vitals:   03/02/23 2103 03/02/23 2110  BP:  132/77  Pulse:  66  Resp:  16  Temp:  98.3 F (36.8 C)  TempSrc:  Oral  SpO2:  97%  Weight: 88 kg   Height:  (1.905 m)    Eyes: PERRL, lids and conjunctivae normal ENMT: Mucous membranes are dry. Posterior pharynx clear of any exudate or lesions.Normal dentition.  Neck: normal, supple, no masses, no thyromegaly Respiratory: clear to auscultation bilaterally, no wheezing, no crackles. Normal respiratory effort. No accessory muscle use.  Cardiovascular: Regular rate and rhythm, no murmurs / rubs / gallops. No extremity edema. 2+ pedal pulses.  Abdomen: no tenderness, no masses palpated. No hepatosplenomegaly. Bowel sounds positive.  Musculoskeletal: no clubbing / cyanosis. No joint deformity upper and lower extremities. Good ROM, no contractures. Normal muscle tone.  Skin: no rashes, lesions, ulcers. No induration Neurologic: CN 2-12 grossly intact. Sensation intact, . Strength 5/5 in all 4 except right lower ext 4/5 with decrease ability to plantar flex. Psychiatric: Normal judgment and insight. Alert and oriented x 3. Normal mood.    Labs on Admission: I have personally reviewed following labs and imaging studies  CBC: Recent Labs  Lab 03/02/23 2125  WBC 14.7*  NEUTROABS 8.5*  HGB 12.7*  HCT 38.8*  MCV 93.3  PLT 308   Basic Metabolic Panel: Recent Labs  Lab 03/02/23 2125  NA 133*  K 3.5  CL 99  CO2 25  GLUCOSE 122*  BUN 21  CREATININE 0.80  CALCIUM 8.7*   GFR: Estimated Creatinine Clearance: 93.9 mL/min (by C-G formula based on SCr of 0.8 mg/dL). Liver Function Tests: Recent Labs  Lab 03/02/23 2125  AST 14*  ALT 6  ALKPHOS 79  BILITOT 0.7  PROT 7.1  ALBUMIN 3.0*   No results for input(s): "LIPASE", "AMYLASE" in the last 168 hours. No  results for input(s): "AMMONIA" in the last 168 hours. Coagulation Profile: Recent Labs  Lab 03/02/23 2125  INR 1.1   Cardiac Enzymes: No results for input(s): "  CKTOTAL", "CKMB", "CKMBINDEX", "TROPONINI" in the last 168 hours. BNP (last 3 results) No results for input(s): "PROBNP" in the last 8760 hours. HbA1C: No results for input(s): "HGBA1C" in the last 72 hours. CBG: No results for input(s): "GLUCAP" in the last 168 hours. Lipid Profile: No results for input(s): "CHOL", "HDL", "LDLCALC", "TRIG", "CHOLHDL", "LDLDIRECT" in the last 72 hours. Thyroid Function Tests: No results for input(s): "TSH", "T4TOTAL", "FREET4", "T3FREE", "THYROIDAB" in the last 72 hours. Anemia Panel: No results for input(s): "VITAMINB12", "FOLATE", "FERRITIN", "TIBC", "IRON", "RETICCTPCT" in the last 72 hours. Urine analysis:    Component Value Date/Time   COLORURINE AMBER (A) 02/07/2023 2335   APPEARANCEUR HAZY (A) 02/07/2023 2335   APPEARANCEUR Clear 08/07/2021 1118   LABSPEC 1.026 02/07/2023 2335   LABSPEC 1.008 02/27/2012 1139   PHURINE 5.0 02/07/2023 2335   GLUCOSEU NEGATIVE 02/07/2023 2335   GLUCOSEU Negative 02/27/2012 1139   HGBUR NEGATIVE 02/07/2023 2335   BILIRUBINUR NEGATIVE 02/07/2023 2335   BILIRUBINUR neg 02/07/2023 0946   BILIRUBINUR Negative 08/07/2021 1118   BILIRUBINUR Negative 02/27/2012 1139   KETONESUR NEGATIVE 02/07/2023 2335   PROTEINUR 100 (A) 02/07/2023 2335   UROBILINOGEN 0.2 02/07/2023 0946   UROBILINOGEN 1.0 04/28/2010 1200   NITRITE NEGATIVE 02/07/2023 2335   LEUKOCYTESUR NEGATIVE 02/07/2023 2335   LEUKOCYTESUR Negative 02/27/2012 1139    Radiological Exams on Admission: MR Lumbar Spine W Wo Contrast  Result Date: 03/02/2023 CLINICAL DATA:  Initial evaluation for osteomyelitis. EXAM: MRI LUMBAR SPINE WITHOUT AND WITH CONTRAST TECHNIQUE: Multiplanar and multiecho pulse sequences of the lumbar spine were obtained without and with intravenous contrast. CONTRAST:  8mL  GADAVIST GADOBUTROL 1 MMOL/ML IV SOLN COMPARISON:  Prior MRI from 02/08/2023. FINDINGS: Segmentation: Standard. Same numbering system employed as on previous exam. Alignment: Chronic 5 mm anterolisthesis of L4 on L5. Underlying mild sigmoid scoliosis. Vertebrae:  Changes of prior interbody fusion at L4-5. There has been interval development of osteomyelitis discitis at the L5-S1 level. Abnormal fluid signal intensity within the L5-S1 disc has increased from prior. Prominent marrow edema and enhancement now seen within the L5-S1 vertebral bodies. Epidural extension with associated ventral epidural abscess extending from approximately L4-5 through S1-2. This measures approximately 1.5 x 0.6 x 5.3 cm in greatest dimensions. Associated epidural enhancement throughout the lower thecal sac. Associated intradural component noted as well as evidence by clumping of the nerve roots of the cauda equina, most obvious at L3-4 (series 10, image 22). Few small loculated components noted inferiorly at the level of S2 (series 10, image 41). Additionally, now seen are paraspinous inflammatory changes, most pronounced within the medial left psoas muscle. Interval development of intramuscular abscesses, with the largest component measuring 2.5 cm (series 10, image 39). No other evidence for infection elsewhere within the lumbar spine. Vertebral body height maintained without acute or interval fracture. Bone marrow signal intensity heterogeneous without worrisome osseous lesion. Conus medullaris and cauda equina: Conus extends to the L2 level. Conus within normal limits. Inflammatory changes about the distal nerve roots of the cauda equina related to osteomyelitis discitis. Paraspinal and other soft tissues: Paraspinous inflammatory changes with left psoas abscesses as above. Chronic postoperative scarring within the posterior paraspinous soft tissues. Approximate 1 cm simple left renal cyst, benign in appearance, no follow-up imaging  recommended. Disc levels: L1-2: Mild disc desiccation with diffuse disc bulge. Superimposed left extraforaminal disc protrusion closely approximates the exiting left L1 nerve root (series 10, image 5). Mild facet hypertrophy. No significant stenosis. L2-3: Disc desiccation with mild  diffuse disc bulge. Moderate facet and ligament flavum hypertrophy. Resultant moderate spinal stenosis with mild bilateral L2 foraminal narrowing. L3-4: Advanced degenerative intervertebral disc space narrowing with diffuse disc bulge. Associated reactive endplate change with marginal endplate osteophytic spurring. Changes are asymmetric to the left. Prior posterior decompression. Moderate facet hypertrophy. Residual moderate left lateral recess stenosis. Central canal remains patent. Moderate bilateral L3 foraminal narrowing. L4-5: Prior interbody fusion with chronic anterolisthesis. Reactive endplate spurring with advanced bilateral facet arthrosis. Resultant moderate to severe spinal stenosis, relatively similar to prior. Mild right L4 foraminal narrowing. Left neural foramen remains patent. L5-S1: Changes of osteomyelitis discitis as detailed above. Associated epidural extension with ventral epidural abscess. Severe right with moderate left facet arthrosis. Resultant moderate canal with moderate to severe bilateral lateral recess stenosis. Moderate right L5 foraminal narrowing. Left neural foramina remains patent. IMPRESSION: 1. 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 Associated intradural component as above. 2. Associated paraspinous inflammatory changes with left psoas abscesses as above. 3. Underlying multilevel degenerative spondylosis and facet arthrosis, most pronounced at L4-5 where there is resultant moderate to severe spinal stenosis. Electronically Signed   By: Rise Mu M.D.   On:  03/02/2023 21:19    EKG: Independently reviewed. 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 Adriana Simas in 2019 -history of lumbar fusion in 2010, as well as cervical spine surgery. -Right lower extremity paresthesias  - persistent since prior hospitalization  - decrease ability to plantar flex  present x years  -pt/ot   HTN -resume amlodipine    Glaucoma  -resume eye drops  Hyperlipidemia -continue on crestor   Urinary retention  BPH - foley place  - voiding trial as able  - finasteride -out patient urology consult once stable   DVT prophylaxis: scd Code Status: full/ as discussed per patient wishes in event of cardiac arrest  Family Communication:  none at bedside Disposition Plan: patient  expected to be admitted greater than 2 midnights  Consults called: Neuro surgery Dr Marcell Barlow, Infectious disease Dr Rivka Safer please call in am Admission status: progressive   Lurline Del MD [facility]  If 7PM-7AM, please contact night-coverage [email or web address] Password [facility]  03/02/2023, 10:16 PM

## 2023-03-02 NOTE — Sepsis Progress Note (Signed)
Elink monitoring for the code sepsis protocol.  

## 2023-03-02 NOTE — Progress Notes (Signed)
PHARMACY -  BRIEF ANTIBIOTIC NOTE   Pharmacy has received consult(s) for Vancomycin from an ED provider.  The patient's profile has been reviewed for ht/wt/allergies/indication/available labs.    One time order(s) placed for Vancomycin 2 gm IV X 1  Further antibiotics/pharmacy consults should be ordered by admitting physician if indicated.                       Thank you, Stepan Verrette D 03/02/2023  9:55 PM

## 2023-03-02 NOTE — Telephone Encounter (Signed)
Thank you :)

## 2023-03-03 ENCOUNTER — Inpatient Hospital Stay: Payer: Medicare Other | Admitting: Radiology

## 2023-03-03 ENCOUNTER — Encounter: Payer: Self-pay | Admitting: Internal Medicine

## 2023-03-03 DIAGNOSIS — M4627 Osteomyelitis of vertebra, lumbosacral region: Secondary | ICD-10-CM

## 2023-03-03 DIAGNOSIS — G062 Extradural and subdural abscess, unspecified: Secondary | ICD-10-CM | POA: Diagnosis not present

## 2023-03-03 DIAGNOSIS — M4646 Discitis, unspecified, lumbar region: Secondary | ICD-10-CM

## 2023-03-03 HISTORY — PX: IR LUMBAR DISC ASPIRATION W/IMG GUIDE: IMG5306

## 2023-03-03 HISTORY — DX: Discitis, unspecified, lumbar region: M46.46

## 2023-03-03 LAB — CBC
HCT: 36.9 % — ABNORMAL LOW (ref 39.0–52.0)
Hemoglobin: 12 g/dL — ABNORMAL LOW (ref 13.0–17.0)
MCH: 30.8 pg (ref 26.0–34.0)
MCHC: 32.5 g/dL (ref 30.0–36.0)
MCV: 94.6 fL (ref 80.0–100.0)
Platelets: 300 10*3/uL (ref 150–400)
RBC: 3.9 MIL/uL — ABNORMAL LOW (ref 4.22–5.81)
RDW: 13.4 % (ref 11.5–15.5)
WBC: 11.7 10*3/uL — ABNORMAL HIGH (ref 4.0–10.5)
nRBC: 0 % (ref 0.0–0.2)

## 2023-03-03 LAB — COMPREHENSIVE METABOLIC PANEL
ALT: 6 U/L (ref 0–44)
AST: 15 U/L (ref 15–41)
Albumin: 2.8 g/dL — ABNORMAL LOW (ref 3.5–5.0)
Alkaline Phosphatase: 72 U/L (ref 38–126)
Anion gap: 6 (ref 5–15)
BUN: 15 mg/dL (ref 8–23)
CO2: 27 mmol/L (ref 22–32)
Calcium: 8.3 mg/dL — ABNORMAL LOW (ref 8.9–10.3)
Chloride: 101 mmol/L (ref 98–111)
Creatinine, Ser: 0.78 mg/dL (ref 0.61–1.24)
GFR, Estimated: >60 mL/min (ref >60)
Glucose, Bld: 100 mg/dL — ABNORMAL HIGH (ref 70–99)
Potassium: 3.6 mmol/L (ref 3.5–5.1)
Sodium: 134 mmol/L — ABNORMAL LOW (ref 135–145)
Total Bilirubin: 0.6 mg/dL (ref 0.3–1.2)
Total Protein: 6.6 g/dL (ref 6.5–8.1)

## 2023-03-03 LAB — C-REACTIVE PROTEIN: CRP: 5.9 mg/dL — ABNORMAL HIGH (ref <1.0)

## 2023-03-03 MED ORDER — LIDOCAINE HCL (PF) 1 % IJ SOLN
INTRAMUSCULAR | Status: AC
  Filled 2023-03-03: qty 30

## 2023-03-03 MED ORDER — FENTANYL CITRATE (PF) 100 MCG/2ML IJ SOLN
INTRAMUSCULAR | Status: AC
  Filled 2023-03-03: qty 2

## 2023-03-03 MED ORDER — LIDOCAINE HCL (PF) 1 % IJ SOLN
5.0000 mL | Freq: Once | INTRAMUSCULAR | Status: AC
  Administered 2023-03-03: 5 mL

## 2023-03-03 MED ORDER — CEFAZOLIN SODIUM-DEXTROSE 2-4 GM/100ML-% IV SOLN
2.0000 g | Freq: Three times a day (TID) | INTRAVENOUS | Status: DC
  Administered 2023-03-03 – 2023-03-08 (×16): 2 g via INTRAVENOUS
  Filled 2023-03-03 (×18): qty 100

## 2023-03-03 MED ORDER — DICLOFENAC SODIUM 1 % EX GEL
4.0000 g | Freq: Four times a day (QID) | CUTANEOUS | Status: DC | PRN
  Administered 2023-03-03 – 2023-03-08 (×8): 4 g via TOPICAL
  Filled 2023-03-03: qty 100

## 2023-03-03 MED ORDER — FENTANYL CITRATE (PF) 100 MCG/2ML IJ SOLN
INTRAMUSCULAR | Status: AC | PRN
  Administered 2023-03-03 (×2): 50 ug via INTRAVENOUS

## 2023-03-03 MED ORDER — HYDROMORPHONE HCL 1 MG/ML IJ SOLN
2.0000 mg | INTRAMUSCULAR | Status: DC | PRN
  Administered 2023-03-03 – 2023-03-05 (×7): 2 mg via INTRAVENOUS
  Filled 2023-03-03 (×7): qty 2

## 2023-03-03 MED ORDER — CHLORHEXIDINE GLUCONATE CLOTH 2 % EX PADS
6.0000 | MEDICATED_PAD | Freq: Every day | CUTANEOUS | Status: DC
  Administered 2023-03-04 – 2023-03-07 (×4): 6 via TOPICAL

## 2023-03-03 MED ORDER — MIDAZOLAM HCL 2 MG/2ML IJ SOLN
INTRAMUSCULAR | Status: AC
  Filled 2023-03-03: qty 4

## 2023-03-03 MED ORDER — MIDAZOLAM HCL 2 MG/2ML IJ SOLN
INTRAMUSCULAR | Status: AC | PRN
  Administered 2023-03-03 (×2): 1 mg via INTRAVENOUS

## 2023-03-03 NOTE — ED Notes (Signed)
Hospitalist at bedside 

## 2023-03-03 NOTE — Consult Note (Addendum)
Consult requested by:  Dr. Vicente Males  Consult requested for:  discitis  Primary Physician:  Malva Limes, MD  History of Present Illness: 03/03/2023 Brett Bender is here today with a chief complaint of increasing back pain.  He has had multiple back surgeries most recent of which was 5 years ago.  He was admitted to the hospital on April 1 due to MSSA bacteremia.  He was started on antibiotic treatment and continue to be compliant with this outside of the hospital.  He had increasing back pain which prompted reimaging.  On this reimage, he was noted to have development of discitis.  He has a longstanding issue of prostate related bladder concerns.  He is on multiple medications for this.  He denies overt lower extremity weakness or changes in sensation.  Due to increasing narcotic usage as well as pain, he has had some difficulty with urination.  That being said, he is able to urinate.  Due to difficulty with mobilization, a Foley catheter was placed to aid in his care.  I have utilized the care everywhere function in epic to review the outside records available from external health systems.  He has longstanding history of weakness with his R ankle.  Review of Systems:  A 10 point review of systems is negative, except for the pertinent positives and negatives detailed in the HPI.  Past Medical History: Past Medical History:  Diagnosis Date   Actinic keratosis    Arthritis    Basal cell carcinoma 11/09/2013   R lateral edge of clavicle   Basal cell carcinoma 02/03/2016   L lateral supraclavicular base of neck/excision   Basal cell carcinoma 11/29/2018   L upper back   Basal cell carcinoma 09/11/2020   R mid back    Cancer    Basal cell carcinoma bilateral arms and left shoulder   Complication of anesthesia    slow to wake up with shoulder surgery   Depression    Enlarged prostate    H/O elbow surgery    left   Herpes simplex    History of kidney stones     History of MRSA infection 2004   skin around eye   Hypertension    Kidney stone    Lumbar stenosis    Migraine headache    Motion sickness    ocean boats   PONV (postoperative nausea and vomiting)    Tendonitis of ankle, left     Past Surgical History: Past Surgical History:  Procedure Laterality Date   APPENDECTOMY     back injection     BACK SURGERY     CYSTOSCOPY/URETEROSCOPY/HOLMIUM LASER/STENT PLACEMENT Left 12/18/2015   Procedure: CYSTOSCOPY/URETEROSCOPY/HOLMIUM LASER LITHOTRIPSY;  Surgeon: Vanna Scotland, MD;  Location: [facility];  Service: Urology;  Laterality: Left;   ELBOW SURGERY     LOWER BACK SURGERY  2010   L4-5 fusion   LUMBAR LAMINECTOMY/DECOMPRESSION MICRODISCECTOMY N/A 08/21/2018   Procedure: LUMBAR LAMINECTOMY/DECOMPRESSION MICRODISCECTOMY 1 LEVEL-L3/4;  Surgeon: Lucy Chris, MD;  Location: [facility];  Service: Neurosurgery;  Laterality: N/A;   NECK SURGERY     Upper neck surgery; with wires placed   SHOULDER SURGERY Right 11/10/2011   arthroscopic surgery . Dr. Katrinka Blazing, [facility]   TEE WITHOUT CARDIOVERSION N/A 02/11/2023   Procedure: TRANSESOPHAGEAL ECHOCARDIOGRAM;  Surgeon: Debbe Odea, MD;  Location: [facility];  Service: Cardiovascular;  Laterality: N/A;   TONSILLECTOMY     TRICEPS TENDON REPAIR Left 12/04/2019   Procedure: TRICEPS TENDON REPAIR, OLECRANON  BURSA;  Surgeon: Signa Kell, MD;  Location: [facility];  Service: Orthopedics;  Laterality: Left;    Allergies: Allergies as of 03/02/2023 - Review Complete 03/02/2023  Allergen Reaction Noted   Penicillins Other (See Comments) 11/18/2015    Medications: Current Meds  Medication Sig   amLODipine (NORVASC) 10 MG tablet TAKE 1 TABLET BY MOUTH ONCE A DAY   aspirin 81 MG chewable tablet Chew by mouth daily.   ceFAZolin (ANCEF) IVPB Inject 6 g into the vein continuous for 23 days. Infuse cefazolin 6gm daily over 24h as continuous infusion Indication:  MSSA bacteremia First Dose: Yes Last Day  of Therapy:  03/08/2023 Labs - Once weekly:  CBC/D and CMP Please pull PIC at completion of IV antibiotics Fax weekly lab results  promptly to [phone] Method of administration: elastomeric Method of administration may be changed at the discretion of home infusion pharmacist based upon assessment of the patient and/or caregiver's ability to self-administer the medication ordered.   diclofenac Sodium (VOLTAREN) 1 % GEL APPLY 4G TOPICALLY FIVE TIMES DAILY   fexofenadine (ALLEGRA ALLERGY) 180 MG tablet Take 1 tablet (180 mg total) by mouth daily.   finasteride (PROSCAR) 5 MG tablet Take 1 tablet (5 mg total) by mouth daily.   gabapentin (NEURONTIN) 300 MG capsule Take 1 capsule (300 mg total) by mouth at bedtime as needed.   latanoprost (XALATAN) 0.005 % ophthalmic solution Place 1 drop into both eyes at bedtime.    mirtazapine (REMERON) 30 MG tablet TAKE 1 TABLET BY MOUTH EVERY NIGHT AT BEDTIME   mometasone (ELOCON) 0.1 % cream Apply 1 application. topically daily as needed (Rash). Qd up to 5 days a week to itchy bumps on body   Multiple Vitamin (MULTIVITAMIN) tablet Take 1 tablet by mouth daily.   Probiotic Product (DIGESTIVE ADVANTAGE PO) Take 1 tablet by mouth daily.   psyllium (METAMUCIL) 58.6 % powder Take 1 packet by mouth daily as needed (regularity).    rosuvastatin (CRESTOR) 20 MG tablet Take 1 tablet (20 mg total) by mouth daily.   tamsulosin (FLOMAX) 0.4 MG CAPS capsule Take 1 capsule (0.4 mg total) by mouth daily.   timolol (TIMOPTIC) 0.5 % ophthalmic solution Place 1 drop into both eyes daily.    tiZANidine (ZANAFLEX) 4 MG tablet TAKE ONE TABLET (4 MG TOTAL) BY MOUTH EVERY EIGHT (EIGHT) HOURS AS NEEDED FOR MUSCLE SPASMS.   traMADol (ULTRAM) 50 MG tablet Take 1 tablet (50 mg total) by mouth every 8 (eight) hours. Take 1 tablet in the morning and then another 8 hours later. Do not take with gabapentin or tizanidine    Social History: Social History   Tobacco Use   Smoking  status: Never   Smokeless tobacco: Never  Vaping Use   Vaping Use: Never used  Substance Use Topics   Alcohol use: No    Alcohol/week: 0.0 standard drinks of alcohol   Drug use: No    Family Medical History: Family History  Problem Relation Age of Onset   Depression Son    Diabetes Neg Hx    Cancer Neg Hx    Heart attack Neg Hx    Kidney disease Neg Hx    Prostate cancer Neg Hx    Kidney cancer Neg Hx    Bladder Cancer Neg Hx     Physical Examination: Vitals:   03/03/23 0509 03/03/23 0700  BP:  (!) 147/91  Pulse:  66  Resp:  17  Temp: 98 F (36.7 C)  SpO2:  94%    General: Patient is well developed, well nourished, calm, collected, and in moderatedistress. Attention to examination is appropriate.  Neck:   Supple.  Full range of motion.  Respiratory: Patient is breathing without any difficulty.   NEUROLOGICAL:     Awake, alert, oriented to person, place, and time.  Speech is clear and fluent.  Cranial Nerves: Pupils equal round and reactive to light.  Facial tone is symmetric.  Facial sensation is symmetric. Shoulder shrug is symmetric. Tongue protrusion is midline.  There is no pronator drift.    Strength: Side Biceps Triceps Deltoid Interossei Grip Wrist Ext. Wrist Flex.  R 5 5 5 5 5 5 5   L 5 5 5 5 5 5 5    Side Iliopsoas Quads Hamstring PF DF EHL  R 4+ 5 5 5 4 4   L 5 5 5 5 5 5    Reflexes are 1+ and symmetric at the biceps, triceps, brachioradialis, patella and achilles.   Hoffman's is absent.   Bilateral upper and lower extremity sensation is intact to light touch.    No evidence of dysmetria noted.  Gait is untested.     Medical Decision Making  Imaging: MRI L spine 03/02/2023 IMPRESSION: 1. 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 Associated intradural component as above. 2. Associated paraspinous  inflammatory changes with left psoas abscesses as above. 3. Underlying multilevel degenerative spondylosis and facet arthrosis, most pronounced at L4-5 where there is resultant moderate to severe spinal stenosis.     Electronically Signed   By: Rise Mu M.D.   On: 03/02/2023 21:19  I have personally reviewed the images and agree with the above interpretation.  Assessment and Plan: Mr. Andres is a pleasant 77 y.o. male with discitis osteomyelitis with new findings on his MRI scan compared to his prior MRI scan from earlier this month.  Due to worsening imaging findings, this represents a failure of medical therapy.  I have recommended repeat sampling of the blood as well as disc aspiration to help elucidate whether there is been a change in the bacteria that are responsible for his current presentation.  I have contacted interventional radiology to ask for disc aspiration.  At this point, I would not recommend surgical intervention as I do not think he has cauda equina syndrome.  His urinary issue is multifactorial given his longstanding history of difficulty urinating, BPH, and need for additional narcotic usage.  He has no sensory or motor changes in his lower extremities that can be directly attributed to his MRI findings.  Will continue to follow.  I have communicated my recommendations to the requesting physician and coordinated care to facilitate these recommendations.     Winnona Wargo K. Myer Haff MD, [facility] Neurosurgery

## 2023-03-03 NOTE — ED Notes (Signed)
Neuro surgery at bedside.

## 2023-03-03 NOTE — ED Notes (Signed)
Pt moved into recliner bedside, per pt request.

## 2023-03-03 NOTE — Evaluation (Signed)
Physical Therapy Evaluation Patient Details Name: Brett Bender MRN: [id number] DOB: 05/04/1946 Today's Date: 03/03/2023  History of Present Illness  Patient is a 77 year old male with complaints of severe progressive back pain. Known MSSA bacteremia with MRI reporting new discitis osteomyelitis. History of lumbar and cervical spine surgeries.  Clinical Impression  Patient is agreeable to PT. He reports getting home health PT recently at home. He was ambulating with a rollator outside the home and a cane in the home.  Today, the patient complains of limited activity tolerance secondary to low back pain despite having pain medication prior. He was seated in the recliner chair per his preference for comfort. Standing assistance required with standing tolerance limited by pain. Recommend PT follow up to maximize independence and facilitate return to prior level of function.      Recommendations for follow up therapy are one component of a multi-disciplinary discharge planning process, led by the attending physician.  Recommendations may be updated based on patient status, additional functional criteria and insurance authorization.  Follow Up Recommendations       Assistance Recommended at Discharge Intermittent Supervision/Assistance  Patient can return home with the following  A little help with walking and/or transfers;A little help with bathing/dressing/bathroom;Assist for transportation;Help with stairs or ramp for entrance;Assistance with cooking/housework    Equipment Recommendations None recommended by PT  Recommendations for Other Services       Functional Status Assessment Patient has had a recent decline in their functional status and demonstrates the ability to make significant improvements in function in a reasonable and predictable amount of time.     Precautions / Restrictions Precautions Precautions: Fall Precaution Comments: general spine precuations for  comfort Restrictions Weight Bearing Restrictions: No      Mobility  Bed Mobility               General bed mobility comments: not observed as patient sitting up on arrival and post session    Transfers Overall transfer level: Needs assistance Equipment used: Rolling walker (2 wheels) Transfers: Sit to/from Stand Sit to Stand: Min assist           General transfer comment: lifting assistance required to stand    Ambulation/Gait               General Gait Details: patient declined due to pain and just having pain meds not long ago  Information systems manager [employer] (Stroke Patients Only)       Balance Overall balance assessment: Needs assistance Sitting-balance support: Feet supported Sitting balance-Leahy Scale: Good     Standing balance support: Bilateral upper extremity supported Standing balance-Leahy Scale: Fair                               Pertinent Vitals/Pain Pain Assessment Pain Assessment: Faces Faces Pain Scale: Hurts even more Pain Location: low back Pain Descriptors / Indicators: Discomfort Pain Intervention(s): Limited activity within patient's tolerance, Monitored during session, Repositioned, Premedicated before session    Home Living Family/patient expects to be discharged to:: Private residence Living Arrangements: Spouse/significant other Available Help at Discharge: Family;Available 24 hours/day Type of Home: House Home Access: Stairs to enter Entrance Stairs-Rails: Left Entrance Stairs-Number of Steps: 8   Home Layout: One level Home Equipment: Agricultural consultant (2 wheels);Rollator (4 wheels);Cane - single point;Shower seat Additional Comments: has home  health PT    Prior Function Prior Level of Function : Independent/Modified Independent             Mobility Comments: using rollator or cane (in the home) for ambulation       Hand Dominance        Extremity/Trunk  Assessment   Upper Extremity Assessment Upper Extremity Assessment: Overall WFL for tasks assessed    Lower Extremity Assessment Lower Extremity Assessment: RLE deficits/detail RLE Deficits / Details: chronic RLE weakness reportsed. dorsiflexion 3+/5       Communication   Communication: No difficulties  Cognition Arousal/Alertness: Awake/alert Behavior During Therapy: WFL for tasks assessed/performed Overall Cognitive Status: Within Functional Limits for tasks assessed                                          General Comments General comments (skin integrity, edema, etc.): encouraged frequent repositioning for comfort.    Exercises     Assessment/Plan    PT Assessment Patient needs continued PT services  PT Problem List Decreased mobility;Decreased activity tolerance;Decreased range of motion;Decreased strength;Decreased balance;Decreased safety awareness;Pain       PT Treatment Interventions DME instruction;Gait training;Stair training;Functional mobility training;Therapeutic activities;Therapeutic exercise;Balance training;Neuromuscular re-education;Cognitive remediation;Patient/family education    PT Goals (Current goals can be found in the Care Plan section)  Acute Rehab PT Goals Patient Stated Goal: pain control PT Goal Formulation: With patient Time For Goal Achievement: 03/17/23 Potential to Achieve Goals: Fair    Frequency Min 3X/week     Co-evaluation               AM-PAC PT "6 Clicks" Mobility  Outcome Measure Help needed turning from your back to your side while in a flat bed without using bedrails?: A Little Help needed moving from lying on your back to sitting on the side of a flat bed without using bedrails?: A Little Help needed moving to and from a bed to a chair (including a wheelchair)?: A Little Help needed standing up from a chair using your arms (e.g., wheelchair or bedside chair)?: A Little Help needed to walk in  hospital room?: A Little Help needed climbing 3-5 steps with a railing? : A Little 6 Click Score: 18    End of Session   Activity Tolerance: Patient tolerated treatment well;Patient limited by pain Patient left: with call bell/phone within reach;in chair;with family/visitor present Nurse Communication: Mobility status PT Visit Diagnosis: Other abnormalities of gait and mobility (R26.89);Pain Pain - Right/Left:  (center) Pain - part of body:  (lumbar spine)    Time: 1610-9604 PT Time Calculation (min) (ACUTE ONLY): 16 min   Charges:   PT Evaluation $PT Eval Low Complexity: 1 Low          Donna Bernard, PT, MPT   Ina Homes 03/03/2023, 12:32 PM

## 2023-03-03 NOTE — Consult Note (Signed)
[facility]                                                                                        Patient Identification: Patient Name: Brett Bender MRN: [id number] Admit Date: 03/02/2023  8:48 PM Today's Date: 03/03/2023 Reason for consult: discitis and osteomyelitis  Requesting provider: Rosezetta Schlatter   Principal Problem:   Epidural abscess Active Problems:   Discitis of lumbar region   Antibiotics:  Vancomycin 4/24 Ceftriaxone 4/24  Lines/Hardware: PICC rt arm   Assessment 77 year old male with PMH of BCC, BPH, renal stones ( cystoscopy and left ureteral stent placement 2017), prior cervical spine surgery 35 years ago per his reports. 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 blood cx cleared 4/3. Wound at the back 4/2 cx MSSA. TTE 4/3 no vegetations/endocarditis. TEE not done due to risk. Planned to continue cefazolin until 5/15  Recommendations  DC Vancomycin and ceftriaxone, will start cefazolin. Suspect Likely vertebral infection is due to MSSA given timeline and typical pathogen Fu blood cx Will request IR to see if they can drain the abscesses if large enough for source control purpose.  Monitor CBC, BMP Secure chatted Neurosurgery as well as IR  Following   Rest of the management as per the primary team. Please call with questions or concerns.  Thank you for the consult  Odette Fraction, MD Infectious Disease Physician [facility] [address] [place], [place]  [address] Phone: [phone]  Fax: [phone]  __________________________________________________________________________________________________________ HPI and Hospital Course: 77 year old male with PMH of BCC, BPH, renal stones ( cystoscopy and left ureteral stent placement 2017), prior cervical spine surgery 35 years ago per his reports. L4-L5 fusion in 2010, Lumbar laminectomy in 08/2018, who was recently discharged on 4/7 IV cefazolin for MSSA bacteremia through PICC line who presented to the ED on 4/24  from outpatient MRI for concern of infection lumbar spine.  Patient reports ongoing lower back pain that has worsened since being discharged. States having difficulty with urination ( chronic) which was attributed to his BPH.  Denies any fever chills nausea vomiting diarrhea or weakness in the lower extremities, bowel or bladder incontinence or saddle anesthesia.  He had paresthesia in right lower extremity but that is unchanged since last hospitalization  At ED afebrile Labs NA 133 BG 122, calcium 8.7 albumin 3.0, AST 14 CRP 5.9 ESR 43 lactic acid 0.6 WBC 14.7 hemoglobin 12.7, MRI L spine -interval development of acute osteomyelitis discitis at L5-S1 with associated ventral epidural abscess extending from L4-L5 through S1-S2 with associated moderate spinal stenosis/associated paraspinous inflammatory changes with left psoas abscesses as above No neurosurgical intervention recommended by neurosurgery and has consulted IR for disc aspiration.   ROS: General- Denies fever, chills, loss of appetite and loss of weight HEENT -  Denies headache, blurry vision, neck pain, sinus pain Chest - Denies any chest pain, SOB or cough CVS- Denies any dizziness/lightheadedness, syncopal attacks, palpitations Abdomen- Denies any nausea, vomiting, abdominal pain, hematochezia and diarrhea Neuro - Denies any weakness, tingling sensation Psych - Denies any changes in mood irritability or depressive symptoms GU-  Denies any burning, dysuria, hematuria or increased frequency of urination Skin - denies any rashes/lesions MSK - denies any joint pain/swelling or restricted ROM, lower back pain +  Past Medical History:  Diagnosis Date   Actinic keratosis    Arthritis    Basal cell carcinoma 11/09/2013   R lateral edge of clavicle   Basal cell carcinoma 02/03/2016   L lateral supraclavicular base of neck/excision   Basal cell carcinoma 11/29/2018   L upper back   Basal cell carcinoma 09/11/2020   R mid back    Cancer    Basal cell carcinoma bilateral arms and left shoulder   Complication of anesthesia    slow to wake up with shoulder surgery   Depression    Enlarged prostate    H/O elbow surgery    left   Herpes simplex    History of kidney stones    History of MRSA infection 2004   skin around eye   Hypertension    Kidney stone    Lumbar stenosis    Migraine headache    Motion sickness    ocean boats   PONV (postoperative nausea and vomiting)    Tendonitis of ankle, left    Past Surgical History:  Procedure Laterality Date   APPENDECTOMY     back injection     BACK SURGERY     CYSTOSCOPY/URETEROSCOPY/HOLMIUM LASER/STENT PLACEMENT Left 12/18/2015   Procedure: CYSTOSCOPY/URETEROSCOPY/HOLMIUM LASER LITHOTRIPSY;  Surgeon: Vanna Scotland, MD;  Location: [facility];  Service: Urology;  Laterality: Left;   ELBOW SURGERY     LOWER BACK SURGERY  2010   L4-5 fusion   LUMBAR LAMINECTOMY/DECOMPRESSION MICRODISCECTOMY N/A 08/21/2018   Procedure: LUMBAR LAMINECTOMY/DECOMPRESSION MICRODISCECTOMY 1 LEVEL-L3/4;  Surgeon: Lucy Chris, MD;  Location: [facility];  Service: Neurosurgery;  Laterality: N/A;   NECK SURGERY     Upper neck surgery; with wires placed   SHOULDER SURGERY Right 11/10/2011   arthroscopic surgery . Dr. Katrinka Blazing, [facility]   TEE WITHOUT CARDIOVERSION N/A 02/11/2023   Procedure: TRANSESOPHAGEAL ECHOCARDIOGRAM;  Surgeon: Debbe Odea, MD;  Location: [facility];  Service: Cardiovascular;   Laterality: N/A;   TONSILLECTOMY     TRICEPS TENDON REPAIR Left 12/04/2019   Procedure: TRICEPS TENDON REPAIR, OLECRANON BURSA;  Surgeon: Signa Kell, MD;  Location: [facility];  Service: Orthopedics;  Laterality: Left;     Scheduled Meds:  acidophilus  1 capsule Oral Daily   amLODipine  10 mg Oral Daily   finasteride  5 mg Oral Daily   latanoprost  1 drop Both Eyes QHS   multivitamin with minerals  1 tablet Oral Daily   psyllium  1 packet Oral Daily   rosuvastatin  20 mg Oral Daily   tamsulosin  0.4 mg Oral Daily   [START ON 03/04/2023] timolol  1 drop Both Eyes Daily   traMADol  50 mg Oral Q8H   Continuous Infusions:  sodium chloride 75 mL/hr at 03/03/23 0759   cefTRIAXone (ROCEPHIN)  IV     vancomycin     PRN Meds:.acetaminophen **OR** acetaminophen, albuterol, gabapentin, morphine injection, ondansetron (ZOFRAN) IV, ondansetron **OR** ondansetron (ZOFRAN) IV, oxyCODONE-acetaminophen  Allergies  Allergen Reactions   Penicillins Other (  See Comments)    Stomach Ache   Social History   Socioeconomic History   Marital status: Married    Spouse name: Not on file   Number of children: 2   Years of education: Not on file   Highest education level: Associate degree: occupational, Scientist, product/process development, or vocational program  Occupational History   Occupation: Retired    Comment: former Programmer, systems  Tobacco Use   Smoking status: Never   Smokeless tobacco: Never  Building services engineer Use: Never used  Substance and Sexual Activity   Alcohol use: No    Alcohol/week: 0.0 standard drinks of alcohol   Drug use: No   Sexual activity: Yes  Other Topics Concern   Not on file  Social History Narrative   Not on file   Social Determinants of Health   Financial Resource Strain: Low Risk  (01/04/2023)   Overall Financial Resource Strain (CARDIA)    Difficulty of Paying Living Expenses: Not hard at all  Food Insecurity: No Food Insecurity (03/03/2023)   Hunger Vital Sign     Worried About Running Out of Food in the Last Year: Never true    Ran Out of Food in the Last Year: Never true  Transportation Needs: No Transportation Needs (03/03/2023)   PRAPARE - Administrator, Civil Service (Medical): No    Lack of Transportation (Non-Medical): No  Physical Activity: Sufficiently Active (01/04/2023)   Exercise Vital Sign    Days of Exercise per Week: 5 days    Minutes of Exercise per Session: 90 min  Stress: No Stress Concern Present (01/04/2023)   [employer] of Occupational Health - Occupational Stress Questionnaire    Feeling of Stress : Not at all  Social Connections: Moderately Integrated (01/04/2023)   Social Connection and Isolation Panel [NHANES]    Frequency of Communication with Friends and Family: More than three times a week    Frequency of Social Gatherings with Friends and Family: More than three times a week    Attends Religious Services: More than 4 times per year    Active Member of [employer] or Organizations: No    Attends Banker Meetings: Never    Marital Status: Married  Catering manager Violence: Not At Risk (03/03/2023)   Humiliation, Afraid, Rape, and Kick questionnaire    Fear of Current or Ex-Partner: No    Emotionally Abused: No    Physically Abused: No    Sexually Abused: No   Family History  Problem Relation Age of Onset   Depression Son    Diabetes Neg Hx    Cancer Neg Hx    Heart attack Neg Hx    Kidney disease Neg Hx    Prostate cancer Neg Hx    Kidney cancer Neg Hx    Bladder Cancer Neg Hx     Vitals BP (!) 159/97   Pulse 70   Temp 98.3 F (36.8 C) (Oral)   Resp 19   Ht 6\' 3"  (1.905 m)   Wt 88 kg   SpO2 95%   BMI 24.25 kg/m    Physical Exam Constitutional:  adult male lying in the bed and somewhat sleepy after getting analgesics.     Comments:   Cardiovascular:     Rate and Rhythm: Normal rate and regular rhythm.     Heart sounds: s1s2  Pulmonary:     Effort: Pulmonary effort  is normal on nasal cannula     Comments: Normal  breath sounds  Abdominal:     Palpations: Abdomen is soft.     Tenderness: non distended and non tender   Musculoskeletal:        General: No swelling or tenderness in peripheral joints or signs of septic joint   Skin:    Comments: No rashes or open wounds including back   Neurological:     General: somewhat sleepy, burt responds to conversation and follows commands. Power 5/5 in all extremities   Psychiatric:        Mood and Affect: Mood normal.    Pertinent Microbiology Results for orders placed or performed during the hospital encounter of 03/02/23  Culture, blood (routine x 2)     Status: None (Preliminary result)   Collection Time: 03/02/23  9:25 PM   Specimen: BLOOD  Result Value Ref Range Status   Specimen Description BLOOD LEFT FOREARM  Final   Special Requests   Final    BOTTLES DRAWN AEROBIC AND ANAEROBIC Blood Culture results may not be optimal due to an excessive volume of blood received in culture bottles   Culture   Final    NO GROWTH < 12 HOURS Performed at [facility], [address] [place], [place] [address]    Report Status PENDING  Incomplete  Culture, blood (routine x 2)     Status: None (Preliminary result)   Collection Time: 03/02/23  9:36 PM   Specimen: BLOOD  Result Value Ref Range Status   Specimen Description BLOOD LEFT ASSIST CONTROL  Final   Special Requests   Final    BOTTLES DRAWN AEROBIC AND ANAEROBIC Blood Culture results may not be optimal due to an excessive volume of blood received in culture bottles   Culture   Final    NO GROWTH < 12 HOURS Performed at [facility], [address] [place], [place] [address]    Report Status PENDING  Incomplete   Pertinent Lab seen by me:    Latest Ref Rng & Units 03/03/2023    4:46 AM 03/02/2023    9:25 PM 02/23/2023   11:03 AM  CBC  WBC 4.0 - 10.5 K/uL 11.7  14.7  10.9   Hemoglobin 13.0 - 17.0 g/dL 09.8  11.9  14.7    Hematocrit 39.0 - 52.0 % 36.9  38.8  40.3   Platelets 150 - 400 K/uL 300  308  677       Latest Ref Rng & Units 03/03/2023    4:46 AM 03/02/2023    9:25 PM 02/23/2023   11:03 AM  CMP  Glucose 70 - 99 mg/dL 829  562  130   BUN 8 - 23 mg/dL Creatinine 0.61 - 1.24 mg/dL 8.65  7.84  6.96   Sodium 135 - 145 mmol/L 134  133  137   Potassium 3.5 - 5.1 mmol/L 3.6  3.5  4.9   Chloride 98 - 111 mmol/L 101  99  96   CO2 22 - 32 mmol/L Calcium 8.9 - 10.3 mg/dL 8.3  8.7  29.5   Total Protein 6.5 - 8.1 g/dL 6.6  7.1  8.1   Total Bilirubin 0.3 - 1.2 mg/dL 0.6  0.7  0.5   Alkaline Phos 38 - 126 U/L 72  79  123   AST 15 - 41 U/L ALT 0 - 44 U/L 6  6  16  Pertinent Imagings/Other Imagings Plain films and CT images have been personally visualized and interpreted; radiology reports have been reviewed. Decision making incorporated into the Impression / Recommendations.  MR Lumbar Spine W Wo Contrast  Result Date: 03/02/2023 CLINICAL DATA:  Initial evaluation for osteomyelitis. EXAM: MRI LUMBAR SPINE WITHOUT AND WITH CONTRAST TECHNIQUE: Multiplanar and multiecho pulse sequences of the lumbar spine were obtained without and with intravenous contrast. CONTRAST:  8mL GADAVIST GADOBUTROL 1 MMOL/ML IV SOLN COMPARISON:  Prior MRI from 02/08/2023. FINDINGS: Segmentation: Standard. Same numbering system employed as on previous exam. Alignment: Chronic 5 mm anterolisthesis of L4 on L5. Underlying mild sigmoid scoliosis. Vertebrae:  Changes of prior interbody fusion at L4-5. There has been interval development of osteomyelitis discitis at the L5-S1 level. Abnormal fluid signal intensity within the L5-S1 disc has increased from prior. Prominent marrow edema and enhancement now seen within the L5-S1 vertebral bodies. Epidural extension with associated ventral epidural abscess extending from approximately L4-5 through S1-2. This measures approximately 1.5 x 0.6 x 5.3 cm in greatest  dimensions. Associated epidural enhancement throughout the lower thecal sac. Associated intradural component noted as well as evidence by clumping of the nerve roots of the cauda equina, most obvious at L3-4 (series 10, image 22). Few small loculated components noted inferiorly at the level of S2 (series 10, image 41). Additionally, now seen are paraspinous inflammatory changes, most pronounced within the medial left psoas muscle. Interval development of intramuscular abscesses, with the largest component measuring 2.5 cm (series 10, image 39). No other evidence for infection elsewhere within the lumbar spine. Vertebral body height maintained without acute or interval fracture. Bone marrow signal intensity heterogeneous without worrisome osseous lesion. Conus medullaris and cauda equina: Conus extends to the L2 level. Conus within normal limits. Inflammatory changes about the distal nerve roots of the cauda equina related to osteomyelitis discitis. Paraspinal and other soft tissues: Paraspinous inflammatory changes with left psoas abscesses as above. Chronic postoperative scarring within the posterior paraspinous soft tissues. Approximate 1 cm simple left renal cyst, benign in appearance, no follow-up imaging recommended. Disc levels: L1-2: Mild disc desiccation with diffuse disc bulge. Superimposed left extraforaminal disc protrusion closely approximates the exiting left L1 nerve root (series 10, image 5). Mild facet hypertrophy. No significant stenosis. L2-3: Disc desiccation with mild diffuse disc bulge. Moderate facet and ligament flavum hypertrophy. Resultant moderate spinal stenosis with mild bilateral L2 foraminal narrowing. L3-4: Advanced degenerative intervertebral disc space narrowing with diffuse disc bulge. Associated reactive endplate change with marginal endplate osteophytic spurring. Changes are asymmetric to the left. Prior posterior decompression. Moderate facet hypertrophy. Residual moderate left  lateral recess stenosis. Central canal remains patent. Moderate bilateral L3 foraminal narrowing. L4-5: Prior interbody fusion with chronic anterolisthesis. Reactive endplate spurring with advanced bilateral facet arthrosis. Resultant moderate to severe spinal stenosis, relatively similar to prior. Mild right L4 foraminal narrowing. Left neural foramen remains patent. L5-S1: Changes of osteomyelitis discitis as detailed above. Associated epidural extension with ventral epidural abscess. Severe right with moderate left facet arthrosis. Resultant moderate canal with moderate to severe bilateral lateral recess stenosis. Moderate right L5 foraminal narrowing. Left neural foramina remains patent. IMPRESSION: 1. 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 Associated intradural component as above. 2. Associated paraspinous inflammatory changes with left psoas abscesses as above. 3. Underlying multilevel degenerative spondylosis and facet arthrosis, most pronounced at L4-5 where there is resultant moderate to severe spinal stenosis. Electronically  Signed   By: Rise Mu M.D.   On: 03/02/2023 21:19   [place] EKG SITE RITE  Result Date: 02/11/2023 If Site Rite image not attached, placement could not be confirmed due to current cardiac rhythm.  ECHOCARDIOGRAM COMPLETE  Result Date: 02/09/2023    ECHOCARDIOGRAM REPORT   Patient Name:   KHUP SAPIA Date of Exam: 02/09/2023 Medical Rec #:  [id number]      Height:       76.0 in Accession #:    [id number]     Weight:       205.9 lb Date of Birth:  Sep 26, 1946     BSA:          2.243 m Patient Age:    76 years       BP:           121/69 mmHg Patient Gender: M              HR:           65 bpm. Exam Location:  [facility] Procedure: 2D Echo, Cardiac Doppler and Color Doppler Indications:     Bacteremia  History:         Patient has no prior history of  Echocardiogram examinations.                  Signs/Symptoms:Bacteremia; Risk Factors:Hypertension.  Sonographer:     Mikki Harbor Referring Phys:  [id number] Sondra Barges Diagnosing Phys: Debbe Odea MD  Sonographer Comments: Image acquisition challenging due to respiratory motion. IMPRESSIONS  1. Left ventricular ejection fraction, by estimation, is 60 to 65%. The left ventricle has normal function. The left ventricle has no regional wall motion abnormalities. Left ventricular diastolic parameters are consistent with Grade I diastolic dysfunction (impaired relaxation).  2. Right ventricular systolic function is normal. The right ventricular size is normal. There is moderately elevated pulmonary artery systolic pressure.  3. The mitral valve is normal in structure. Trivial mitral valve regurgitation.  4. The aortic valve was not well visualized. Aortic valve regurgitation is trivial.  5. Aortic dilatation noted. There is mild dilatation of the aortic root, measuring 40 mm. FINDINGS  Left Ventricle: Left ventricular ejection fraction, by estimation, is 60 to 65%. The left ventricle has normal function. The left ventricle has no regional wall motion abnormalities. The left ventricular internal cavity size was normal in size. There is  no left ventricular hypertrophy. Left ventricular diastolic parameters are consistent with Grade I diastolic dysfunction (impaired relaxation). Right Ventricle: The right ventricular size is normal. No increase in right ventricular wall thickness. Right ventricular systolic function is normal. There is moderately elevated pulmonary artery systolic pressure. The tricuspid regurgitant velocity is 3.29 m/s, and with an assumed right atrial pressure of 8 mmHg, the estimated right ventricular systolic pressure is 51.3 mmHg. Left Atrium: Left atrial size was normal in size. Right Atrium: Right atrial size was normal in size. Pericardium: There is no evidence of pericardial effusion.  Mitral Valve: The mitral valve is normal in structure. Trivial mitral valve regurgitation. MV peak gradient, 4.6 mmHg. The mean mitral valve gradient is 1.0 mmHg. Tricuspid Valve: The tricuspid valve is normal in structure. Tricuspid valve regurgitation is mild. Aortic Valve: The aortic valve was not well visualized. Aortic valve regurgitation is trivial. Aortic valve mean gradient measures 7.0 mmHg. Aortic valve peak gradient measures 13.1 mmHg. Aortic valve area, by VTI measures 2.52 cm. Pulmonic Valve: The pulmonic valve was not well visualized. Pulmonic valve regurgitation  is not visualized. Aorta: Aortic dilatation noted. There is mild dilatation of the aortic root, measuring 40 mm. Venous: The inferior vena cava was not well visualized. IAS/Shunts: No atrial level shunt detected by color flow Doppler.  LEFT VENTRICLE PLAX 2D LVIDd:         5.10 cm   Diastology LVIDs:         3.20 cm   LV e' medial:    7.29 cm/s LV PW:         1.10 cm   LV E/e' medial:  9.4 LV IVS:        1.20 cm   LV e' lateral:   11.50 cm/s LVOT diam:     1.90 cm   LV E/e' lateral: 6.0 LV SV:         99 LV SV Index:   44 LVOT Area:     2.84 cm  RIGHT VENTRICLE RV Basal diam:  3.75 cm RV Mid diam:    3.00 cm RV S prime:     16.80 cm/s TAPSE (M-mode): 2.9 cm LEFT ATRIUM             Index        RIGHT ATRIUM           Index LA diam:        4.50 cm 2.01 cm/m   RA Area:     20.20 cm LA Vol (A2C):   84.6 ml 37.72 ml/m  RA Volume:   57.00 ml  25.41 ml/m LA Vol (A4C):   53.5 ml 23.85 ml/m LA Biplane Vol: 71.3 ml 31.79 ml/m  AORTIC VALVE                     PULMONIC VALVE AV Area (Vmax):    2.48 cm      PV Vmax:       1.03 m/s AV Area (Vmean):   2.25 cm      PV Peak grad:  4.2 mmHg AV Area (VTI):     2.52 cm AV Vmax:           181.00 cm/s AV Vmean:          125.000 cm/s AV VTI:            0.392 m AV Peak Grad:      13.1 mmHg AV Mean Grad:      7.0 mmHg LVOT Vmax:         158.00 cm/s LVOT Vmean:        99.100 cm/s LVOT VTI:          0.348 m  LVOT/AV VTI ratio: 0.89  AORTA Ao Root diam: 4.00 cm Ao Asc diam:  3.60 cm MITRAL VALVE               TRICUSPID VALVE MV Area (PHT): 3.16 cm    TR Peak grad:   43.3 mmHg MV Area VTI:   3.08 cm    TR Vmax:        329.00 cm/s MV Peak grad:  4.6 mmHg MV Mean grad:  1.0 mmHg    SHUNTS MV Vmax:       1.07 m/s    Systemic VTI:  0.35 m MV Vmean:      52.1 cm/s   Systemic Diam: 1.90 cm MV Decel Time: 240 msec MV E velocity: 68.70 cm/s MV A velocity: 99.00 cm/s MV E/A ratio:  0.69 Debbe Odea MD Electronically signed by Debbe Odea  MD Signature Date/Time: 02/09/2023/1:38:38 PM    Final    MR Lumbar Spine W Wo Contrast  Result Date: 02/08/2023 CLINICAL DATA:  Septic with severe right-sided lumbar pain EXAM: MRI LUMBAR SPINE WITHOUT AND WITH CONTRAST TECHNIQUE: Multiplanar and multiecho pulse sequences of the lumbar spine were obtained without and with intravenous contrast. CONTRAST:  10mL GADAVIST GADOBUTROL 1 MMOL/ML IV SOLN COMPARISON:  07/24/2018 FINDINGS: Segmentation: 5 non rib-bearing vertebral bodies. The lowest fully formed disc space is labeled L5-S1. Alignment: 5 mm anterolisthesis of L4 on L5 unchanged from prior exam. 4 mm retrolisthesis of L3 on L4 appears new from the prior exam. Mild dextrocurvature. Vertebrae: No acute fracture or suspicious osseous lesion. A small amount of enhancement is noted at the inferior aspect of T12, where a Schmorl's node appears to be forming, and about the L3-L4 disc space, which correlates with edema on the STIR and is favored to be related to adjacent degenerative changes. No evidence of discitis. Postsurgical changes at L3-L4 and L4-L5. Conus medullaris and cauda equina: Conus extends to the L2 level. Conus and cauda equina appear normal. No abnormal enhancement. No epidural collection. Paraspinal and other soft tissues: Negative. Disc levels: T12-L1: Minimal disc bulge. Mild facet arthropathy. No spinal canal stenosis or neural foraminal narrowing. L1-L2: No  significant disc bulge. Mild facet arthropathy. No spinal canal stenosis or neural foraminal narrowing. L2-L3: Minimal disc bulge. Mild facet arthropathy. Ligamentum flavum hypertrophy. Mild spinal canal stenosis and mild bilateral neural foraminal narrowing, which have progressed from the prior exam. Narrowing of the lateral recesses. L3-L4: Mild retrolisthesis with disc osteophyte complex. Moderate facet arthropathy. Interval laminectomy. No residual spinal canal stenosis. Narrowing of the left lateral recess. Moderate left-greater-than-right neural foraminal narrowing, similar to prior L4-L5: Status post fusion. Grade 1 anterolisthesis with disc unroofing. Severe facet arthropathy. Moderate spinal canal stenosis, which has progressed from the prior exam. Narrowing of the lateral recesses. No neural foraminal narrowing. L5-S1: No significant disc bulge. Possible granulation tissue along the anterior aspect of the thecal sac versus extruded disc. Moderate to severe right and mild left facet arthropathy. Mild spinal canal stenosis. Narrowing of the lateral recesses. Moderate right neural foraminal narrowing, which has progressed from the prior exam IMPRESSION: 1. No evidence of discitis or osteomyelitis. No epidural collection. 2. L4-L5 moderate spinal canal stenosis, which has progressed from the prior exam. 3. L5-S1 mild spinal canal stenosis and moderate right neural foraminal narrowing, which has progressed from the prior exam. 4. L3-L4 moderate left-greater-than-right neural foraminal narrowing. 5. L2-L3 mild spinal canal stenosis and mild bilateral neural foraminal narrowing. 6. Narrowing of the lateral recesses at L2-L3, L3-L4, L4-L5, and L5-S1 could affect the descending L3, L4, L5, and S1 nerve roots, respectively. Electronically Signed   By: Wiliam Ke M.D.   On: 02/08/2023 02:26   CT Renal Stone Study  Result Date: 02/08/2023 CLINICAL DATA:  Right back and flank pain.  Urolithiasis. EXAM: CT ABDOMEN  AND PELVIS WITHOUT CONTRAST TECHNIQUE: Multidetector CT imaging of the abdomen and pelvis was performed following the standard protocol without IV contrast. RADIATION DOSE REDUCTION: This exam was performed according to the departmental dose-optimization program which includes automated exposure control, adjustment of the mA and/or kV according to patient size and/or use of iterative reconstruction technique. COMPARISON:  05/03/2019 FINDINGS: Lower chest: No acute abnormality. Extensive coronary artery calcification Hepatobiliary: No focal liver abnormality is seen. No gallstones, gallbladder wall thickening, or biliary dilatation. Pancreas: Unremarkable Spleen: Unremarkable Adrenals/Urinary Tract: Right adrenal gland is unremarkable.  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 No follow-up imaging is recommended for this lesion. The kidneys are normal in size and position. Multiple nonobstructing calculi are seen within the kidneys bilaterally measuring up to 8 mm within the upper pole of the right kidney and 2 mm within the interpolar region of the left kidney. Stone burden within the right kidney has progressed slightly since prior examination. No hydronephrosis. No ureteral calculi. Moderate bilateral nonspecific perinephric stranding is present, progressive since prior examination. No perinephric fluid collections are seen. The bladder is decompressed and is unremarkable. Stomach/Bowel: Stomach is within normal limits. Appendix appears normal. No evidence of bowel wall thickening, distention, or inflammatory changes. Vascular/Lymphatic: Aortic atherosclerosis. No enlarged abdominal or pelvic lymph nodes. Reproductive: Prostate is unremarkable. Other: Tiny umbilical hernia contains mesenteric fat as well as the distal tip of the appendix. Musculoskeletal: No acute bone abnormality. L4-5 anterior lumbar fusion procedure with instrumentation has been performed. No definite bridging callus  identified. Advanced degenerative changes are seen at L3-4. No acute bone abnormality. No suspicious lytic or blastic bone lesion. Postsurgical changes are seen within the right iliac spine. IMPRESSION: 1. No acute intra-abdominal pathology identified. 2. Extensive coronary artery calcification. 3. 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 No follow-up imaging is recommended for this lesion. 4. Right greater than left nonobstructing nephrolithiasis. Stone burden within the right kidney has progressed slightly since prior examination. No urolithiasis. No hydronephrosis. 5. Progressive nonspecific bilateral perinephric stranding. Correlation with laboratory examination may be helpful to exclude an underlying infectious or inflammatory process. Aortic Atherosclerosis (ICD10-I70.0). Electronically Signed   By: Helyn Numbers M.D.   On: 02/08/2023 01:11   CT Angio Chest PE W and/or Wo Contrast  Result Date: 02/08/2023 CLINICAL DATA:  Syncope. EXAM: CT ANGIOGRAPHY CHEST WITH CONTRAST TECHNIQUE: Multidetector CT imaging of the chest was performed using the standard protocol during bolus administration of intravenous contrast. Multiplanar CT image reconstructions and MIPs were obtained to evaluate the vascular anatomy. RADIATION DOSE REDUCTION: This exam was performed according to the departmental dose-optimization program which includes automated exposure control, adjustment of the mA and/or kV according to patient size and/or use of iterative reconstruction technique. CONTRAST:  60mL OMNIPAQUE IOHEXOL 350 MG/ML SOLN COMPARISON:  Chest radiograph dated 02/07/2023. FINDINGS: Evaluation of this exam is limited due to respiratory motion artifact. Cardiovascular: There is no cardiomegaly or pericardial effusion. Coronary vascular calcification of the LAD and left circumflex artery. Mild atherosclerotic calcification of the thoracic aorta. No aneurysmal dilatation. Evaluation of the pulmonary arteries is limited due to respiratory  motion and suboptimal visualization of the peripheral branches. No large central pulmonary artery embolus identified. Mediastinum/Nodes: No hilar or mediastinal adenopathy. The esophagus is grossly unremarkable. No mediastinal fluid collection. Lungs/Pleura: Bibasilar dependent atelectasis. No focal consolidation, pleural effusion, or pneumothorax. The central airways are patent. Upper Abdomen: Fatty liver.  A 2 cm left adrenal adenoma. Musculoskeletal: No acute osseous pathology. Review of the MIP images confirms the above findings. IMPRESSION: 1. No acute intrathoracic pathology. No large central pulmonary artery embolus identified. 2. Coronary vascular calcification of the LAD and left circumflex artery. 3. Fatty liver. 4. A 2 cm left adrenal adenoma. 5.  Aortic Atherosclerosis (ICD10-I70.0). Electronically Signed   By: Elgie Collard M.D.   On: 02/08/2023 01:02   CT HEAD WO CONTRAST ( )  Result Date: 02/08/2023 CLINICAL DATA:  Altered mental status EXAM: CT HEAD WITHOUT CONTRAST TECHNIQUE: Contiguous axial images were obtained from the base of the skull through the  vertex without intravenous contrast. RADIATION DOSE REDUCTION: This exam was performed according to the departmental dose-optimization program which includes automated exposure control, adjustment of the mA and/or kV according to patient size and/or use of iterative reconstruction technique. COMPARISON:  01/05/2020 FINDINGS: Brain: Normal anatomic configuration. Parenchymal volume loss is commensurate with the patient's age. Mild periventricular white matter changes are present likely reflecting the sequela of small vessel ischemia. No abnormal intra or extra-axial mass lesion or fluid collection. No abnormal mass effect or midline shift. No evidence of acute intracranial hemorrhage or infarct. Ventricular size is normal. Cerebellum unremarkable. Vascular: No asymmetric hyperdense vasculature at the skull base. Skull: Intact Sinuses/Orbits:  Paranasal sinuses are clear. Orbits are unremarkable. Other: Mastoid air cells and middle ear cavities are clear. IMPRESSION: 1. No acute intracranial hemorrhage or infarct. 2. Mild senescent change. Electronically Signed   By: Helyn Numbers M.D.   On: 02/08/2023 01:01   DG Chest Port 1 View  Result Date: 02/07/2023 CLINICAL DATA:  Opiate overdose, lethargy EXAM: PORTABLE CHEST 1 VIEW COMPARISON:  06/28/2019 FINDINGS: Single frontal view of the chest demonstrates an unremarkable cardiac silhouette. Continued ectasia of the thoracic aorta, likely not appreciably changed allowing for differences due to portable technique and AP positioning. Lung volumes are diminished, with patchy left basilar consolidation concerning for aspiration given clinical presentation. No effusion or pneumothorax. IMPRESSION: 1. Patchy left basilar consolidation, which could reflect hypoventilatory changes or aspiration given clinical presentation. 2. Stable thoracic aortic ectasia. Electronically Signed   By: Sharlet Salina M.D.   On: 02/07/2023 23:54    I have personally spent 85 minutes involved in face-to-face and non-face-to-face activities for this patient on the day of the visit. Professional time spent includes the following activities: Preparing to see the patient (review of tests), Obtaining and/or reviewing separately obtained history (admission/discharge record), Performing a medically appropriate examination and/or evaluation , Ordering medications/tests/procedures, referring and communicating with other health care professionals, Documenting clinical information in the EMR, Independently interpreting results (not separately reported), Communicating results to the patient/family/caregiver, Counseling and educating the patient/family/caregiver and Care coordination (not separately reported).  Electronically signed by:   Odette Fraction, MD Infectious Disease Physician [facility] Pager: [phone]

## 2023-03-03 NOTE — Procedures (Signed)
Interventional Radiology Procedure Note  Procedure:   Fluoro guided disc aspirate, L5-S1.  Aspiration of ~3cc of frank pus.    Complications: None  Recommendations:  - Follow up culture.  - Routine wound care - OK to advance diet per primary order - OK for any needed AC.   Signed,  Yvone Neu. Loreta Ave, DO

## 2023-03-03 NOTE — Progress Notes (Signed)
Progress Note   Patient: Brett Bender ZOX:[id number] DOB: 12-Jul-1946 DOA: 03/02/2023     1 DOS: the patient was seen and examined on 03/03/2023    Subjective:  Patient seen and examined at bedside this morning Still complaining of severe back pain unrelieved pain with morphine Pain medication changed to Dilaudid for better pain control Denies abdominal pain chest pain or cough   Brief hospital course: Brett Bender is a 77 y.o. male with medical history significant of  renal stones, BCC, BPH, djd of the spine(history of lumbar surgery L3-L4 decompression done by Dr. Adriana Simas in 2019,lumbar fusion in 2010, as well as cervical spine surgery.) , depression, hypertension with recent interim history of admission 02/07/2023 with discharge 02/13/2023 with diagnosis of sepsis due to MSSA bacteremia  from skin wounds,as well as initially presenting with accidental opiate overdose. Patient was discharged with cefazolin iv , recent f/u with ID rec stop date 03/23/2023.  Patient present to ED after MRI ordered after seen ID on 4/23 due to progressive lower back pain. Out patient MRI completed today noted epidural abscess. Patient presents for further treatment and neurosurgery care.  Patient noted difficulty with urination however has  had this problem since hospitalization and this was noted to be related to history of BPH.  Patient notes no fever/chills / n/v/d/ diarrhea, weakness in lower extremities , bladder or bowel incontinence or saddle anesthesia. He notes paresthesia in right lower extremity but this is unchanged since last hospitalization.    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  Patient meets SIRS criteria including respiratory rate of 23, WBC of 14.7 with source of infection as indicated above -in setting  of recent MSSA bacteremia related to skin wounds   - presented with worsening back pain  - plan for IR drainage of abscess  -Continue antibiotics as recommended by infectious disease -Follow-up on CRP /ESR ordered -Interventional radiologist, neurosurgeon as well as infectious disease on board - monitor neuro checks      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 Adriana Simas in 2019 -history of lumbar fusion in 2010, as well as cervical spine surgery. -Right lower extremity paresthesias  - persistent since prior hospitalization  - decrease ability to plantar flex  present x years  PT OT on board   HTN -resumed amlodipine      Glaucoma  -resumed eye drops   Hyperlipidemia -continue on crestor    Urinary retention  BPH - foley place  - voiding trial as able  - finasteride -out patient urology consult once stable    DVT prophylaxis: SCDs  Code Status: full code  Consults: Neuro surgery Dr Marcell Barlow, Infectious disease, interventional radiology  Physical Exam: General: Appears to be in some distress on account of back pain Neck: normal, supple, no masses, no thyromegaly Respiratory: clear to auscultation bilaterally, Cardiovascular: Regular rate and rhythm, no murmurs / rubs / gallops.  Abdomen: no tenderness, no masses palpated.  Musculoskeletal: no clubbing / cyanosis. No joint deformity upper and lower extremities. Good ROM, no contractures. Normal muscle tone.  Skin: Hyper zoster rash noted to the lower back Neurologic: CN 2-12 grossly intact. Sensation intact, . Strength 5/5 in all 4 except right lower ext 4/5 with decrease ability to plantar flex. Psychiatric: Normal judgment and insight.   Vitals:   03/03/23 1100 03/03/23 1130 03/03/23 1234 03/03/23 1446  BP: 104/89 (!) 155/88 (!) 148/84 (!) 155/88  Pulse: 66 76 63 69  Resp: 13 (!) Temp:   97.8 F (36.6 C) 97.8 F (36.6 C)  TempSrc:    Oral  SpO2: 95% 94% 96% 95%  Weight:   86.9 kg    Height:        Data Reviewed: Have personally reviewed patient's laboratory results today showing WBC 11.7 hemoglobin 12.0  Family Communication: Discussed with wife present at bedside  Disposition: Status is: Inpatient Remains inpatient appropriate because: Continues to require IV antibiotics with several consultants input including infectious disease, neurosurgeon as well as interventional radiologist  Time spent: 55 minutes  Author: Loyce Dys, MD 03/03/2023 2:58 PM  For on call review [email or web address].

## 2023-03-03 NOTE — ED Notes (Signed)
Pt given a toothbrush and toothpaste and was able to brush teeth. Materials were rinsed and left at sink in room. Pt also given chapstick per request.

## 2023-03-03 NOTE — Consult Note (Signed)
Chief Complaint: Patient was seen in consultation today for  Chief Complaint  Patient presents with   Back Pain  L5-S1 discitis with epidural extension and abscess at the request of Lurline Del, MD   Referring Physician(s): Lurline Del, MD   Supervising Physician: Gilmer Mor  Patient Status: [facility] - ED  History of Present Illness: Brett Bender is a 77 y.o. male with significant PMHx for previous back surgery L3-L4 fusion in 2010 and decompression in 2019, BPH, BCC multiple sites, shingles on his lower back, kidney stones, HTN, and recent MSSA bacteremia treated on April 1 with hospital admission and has had PICC line with antibiotic therapy x 2 weeks. He states he is unsure what started his infection in the first place, no recent injuries or surgeries, he did state he picked a scab on his upper back and feels this might have led to the infection. Patient was complaining of worsening lower back pain and MRI was done 4/24 with evidence of L5-S1 discitis and extension of fluid into epidural space concerning for abscess. The patient was sent to the ED and has been evaluated by neurosurgery. Request received for IR evaluation.   Patient complains of ongoing lower back pain, denies any fever or chills. He states his RLE weakness is not new and he has been dealing with this for awhile. The patient denies any current chest pain or shortness of breath. He has been compliant with his antibiotics at home. The patient denies any history of sleep apnea or chronic oxygen use. He has no known complications to sedation.    Past Medical History:  Diagnosis Date   Actinic keratosis    Arthritis    Basal cell carcinoma 11/09/2013   R lateral edge of clavicle   Basal cell carcinoma 02/03/2016   L lateral supraclavicular base of neck/excision   Basal cell carcinoma 11/29/2018   L upper back   Basal cell carcinoma 09/11/2020   R mid back    Cancer    Basal cell carcinoma  bilateral arms and left shoulder   Complication of anesthesia    slow to wake up with shoulder surgery   Depression    Enlarged prostate    H/O elbow surgery    left   Herpes simplex    History of kidney stones    History of MRSA infection 2004   skin around eye   Hypertension    Kidney stone    Lumbar stenosis    Migraine headache    Motion sickness    ocean boats   PONV (postoperative nausea and vomiting)    Tendonitis of ankle, left     Past Surgical History:  Procedure Laterality Date   APPENDECTOMY     back injection     BACK SURGERY     CYSTOSCOPY/URETEROSCOPY/HOLMIUM LASER/STENT PLACEMENT Left 12/18/2015   Procedure: CYSTOSCOPY/URETEROSCOPY/HOLMIUM LASER LITHOTRIPSY;  Surgeon: Vanna Scotland, MD;  Location: [facility] ORS;  Service: Urology;  Laterality: Left;   ELBOW SURGERY     LOWER BACK SURGERY  2010   L4-5 fusion   LUMBAR LAMINECTOMY/DECOMPRESSION MICRODISCECTOMY N/A 08/21/2018   Procedure: LUMBAR LAMINECTOMY/DECOMPRESSION MICRODISCECTOMY 1 LEVEL-L3/4;  Surgeon: Lucy Chris, MD;  Location: [facility] ORS;  Service: Neurosurgery;  Laterality: N/A;   NECK SURGERY     Upper neck surgery; with wires placed   SHOULDER SURGERY Right 11/10/2011   arthroscopic surgery . Dr. Katrinka Blazing, [facility]   TEE WITHOUT CARDIOVERSION N/A 02/11/2023   Procedure: TRANSESOPHAGEAL ECHOCARDIOGRAM;  Surgeon:  Debbe Odea, MD;  Location: [facility] ORS;  Service: Cardiovascular;  Laterality: N/A;   TONSILLECTOMY     TRICEPS TENDON REPAIR Left 12/04/2019   Procedure: TRICEPS TENDON REPAIR, OLECRANON BURSA;  Surgeon: Signa Kell, MD;  Location: [facility];  Service: Orthopedics;  Laterality: Left;    Allergies: Penicillins  Medications: Prior to Admission medications   Medication Sig Start Date End Date Taking? Authorizing Provider  amLODipine (NORVASC) 10 MG tablet TAKE 1 TABLET BY MOUTH ONCE A DAY 12/21/22  Yes Malva Limes, MD  aspirin 81 MG chewable tablet Chew by mouth daily.   Yes  [provider]  ceFAZolin (ANCEF) IVPB Inject 6 g into the vein continuous for 23 days. Infuse cefazolin 6gm daily over 24h as continuous infusion Indication:  MSSA bacteremia First Dose: Yes Last Day of Therapy:  03/08/2023 Labs - Once weekly:  CBC/D and CMP Please pull PIC at completion of IV antibiotics Fax weekly lab results  promptly to [phone] Method of administration: elastomeric Method of administration may be changed at the discretion of home infusion pharmacist based upon assessment of the patient and/or caregiver's ability to self-administer the medication ordered. 02/13/23 03/08/23 Yes Sunnie Nielsen, DO  diclofenac Sodium (VOLTAREN) 1 % GEL APPLY 4G TOPICALLY FIVE TIMES DAILY 03/24/22  Yes Malva Limes, MD  fexofenadine [facility] ALLERGY) 180 MG tablet Take 1 tablet (180 mg total) by mouth daily. 05/28/21  Yes Vigg, Avanti, MD  finasteride (PROSCAR) 5 MG tablet Take 1 tablet (5 mg total) by mouth daily. 06/09/22  Yes McGowan, Carollee Herter A, PA-C  gabapentin (NEURONTIN) 300 MG capsule Take 1 capsule (300 mg total) by mouth at bedtime as needed. 03/02/23  Yes Drubel, Lillia Abed, PA-C  latanoprost (XALATAN) 0.005 % ophthalmic solution Place 1 drop into both eyes at bedtime.  12/05/18  Yes [provider]  mirtazapine (REMERON) 30 MG tablet TAKE 1 TABLET BY MOUTH EVERY NIGHT AT BEDTIME 05/27/22  Yes Ostwalt, Janna, PA-C  mometasone (ELOCON) 0.1 % cream Apply 1 application. topically daily as needed (Rash). Qd up to 5 days a week to itchy bumps on body 01/20/22  Yes Deirdre Evener, MD  Multiple Vitamin (MULTIVITAMIN) tablet Take 1 tablet by mouth daily.   Yes [provider]  Probiotic Product (DIGESTIVE ADVANTAGE PO) Take 1 tablet by mouth daily.   Yes [provider]  psyllium (METAMUCIL) 58.6 % powder Take 1 packet by mouth daily as needed (regularity).    Yes [provider]  rosuvastatin (CRESTOR) 20 MG tablet Take 1 tablet (20 mg total) by  mouth daily. 02/24/23  Yes Jacky Kindle, FNP  tamsulosin (FLOMAX) 0.4 MG CAPS capsule Take 1 capsule (0.4 mg total) by mouth daily. 06/09/22  Yes McGowan, Carollee Herter A, PA-C  timolol (TIMOPTIC) 0.5 % ophthalmic solution Place 1 drop into both eyes daily.  07/04/19  Yes [provider]  tiZANidine (ZANAFLEX) 4 MG tablet TAKE ONE TABLET (4 MG TOTAL) BY MOUTH EVERY EIGHT (EIGHT) HOURS AS NEEDED FOR MUSCLE SPASMS. 03/02/23  Yes Malva Limes, MD  traMADol (ULTRAM) 50 MG tablet Take 1 tablet (50 mg total) by mouth every 8 (eight) hours. Take 1 tablet in the morning and then another 8 hours later. Do not take with gabapentin or tizanidine 03/02/23  Yes Drubel, Lillia Abed, PA-C  mupirocin ointment (BACTROBAN) 2 % Apply 1 application. topically daily. Qd to open sores Patient not taking: Reported on 03/02/2023 01/20/22   Deirdre Evener, MD  Family History  Problem Relation Age of Onset   Depression Son    Diabetes Neg Hx    Cancer Neg Hx    Heart attack Neg Hx    Kidney disease Neg Hx    Prostate cancer Neg Hx    Kidney cancer Neg Hx    Bladder Cancer Neg Hx     Social History   Socioeconomic History   Marital status: Married    Spouse name: Not on file   Number of children: 2   Years of education: Not on file   Highest education level: Associate degree: occupational, Scientist, product/process development, or vocational program  Occupational History   Occupation: Retired    Comment: former Programmer, systems  Tobacco Use   Smoking status: Never   Smokeless tobacco: Never  Building services engineer Use: Never used  Substance and Sexual Activity   Alcohol use: No    Alcohol/week: 0.0 standard drinks of alcohol   Drug use: No   Sexual activity: Yes  Other Topics Concern   Not on file  Social History Narrative   Not on file   Social Determinants of Health   Financial Resource Strain: Low Risk  (01/04/2023)   Overall Financial Resource Strain (CARDIA)    Difficulty of Paying Living Expenses: Not hard at  all  Food Insecurity: No Food Insecurity (03/03/2023)   Hunger Vital Sign    Worried About Running Out of Food in the Last Year: Never true    Ran Out of Food in the Last Year: Never true  Transportation Needs: No Transportation Needs (03/03/2023)   PRAPARE - Administrator, Civil Service (Medical): No    Lack of Transportation (Non-Medical): No  Physical Activity: Sufficiently Active (01/04/2023)   Exercise Vital Sign    Days of Exercise per Week: 5 days    Minutes of Exercise per Session: 90 min  Stress: No Stress Concern Present (01/04/2023)   [employer] of Occupational Health - Occupational Stress Questionnaire    Feeling of Stress : Not at all  Social Connections: Moderately Integrated (01/04/2023)   Social Connection and Isolation Panel [NHANES]    Frequency of Communication with Friends and Family: More than three times a week    Frequency of Social Gatherings with Friends and Family: More than three times a week    Attends Religious Services: More than 4 times per year    Active Member of [employer] or Organizations: No    Attends Banker Meetings: Never    Marital Status: Married    Review of Systems: A 12 point ROS discussed and pertinent positives are indicated in the HPI above.  All other systems are negative.  Review of Systems  Vital Signs: BP (!) 159/97   Pulse 70   Temp 98.3 F (36.8 C) (Oral)   Resp 19   Ht  (1.905 m)   Wt 194 lb (88 kg)   SpO2 95%   BMI 24.25 kg/m   Physical Exam Constitutional:      General: He is not in acute distress.    Appearance: Normal appearance.  HENT:     Head: Normocephalic and atraumatic.  Cardiovascular:     Rate and Rhythm: Normal rate and regular rhythm.  Pulmonary:     Effort: Pulmonary effort is normal. No respiratory distress.     Breath sounds: Normal breath sounds.  Musculoskeletal:     Comments: RUE PICC intact  Skin:    General: Skin  is warm and dry.  Neurological:     Mental  Status: He is alert and oriented to person, place, and time.     Comments: Plantar flexion B/L LE equal and intact, right sided dorsiflexion weakness- patient states chronic    Imaging: MR Lumbar Spine W Wo Contrast  Result Date: 03/02/2023 CLINICAL DATA:  Initial evaluation for osteomyelitis. EXAM: MRI LUMBAR SPINE WITHOUT AND WITH CONTRAST TECHNIQUE: Multiplanar and multiecho pulse sequences of the lumbar spine were obtained without and with intravenous contrast. CONTRAST:  8mL GADAVIST GADOBUTROL 1 MMOL/ML IV SOLN COMPARISON:  Prior MRI from 02/08/2023. FINDINGS: Segmentation: Standard. Same numbering system employed as on previous exam. Alignment: Chronic 5 mm anterolisthesis of L4 on L5. Underlying mild sigmoid scoliosis. Vertebrae:  Changes of prior interbody fusion at L4-5. There has been interval development of osteomyelitis discitis at the L5-S1 level. Abnormal fluid signal intensity within the L5-S1 disc has increased from prior. Prominent marrow edema and enhancement now seen within the L5-S1 vertebral bodies. Epidural extension with associated ventral epidural abscess extending from approximately L4-5 through S1-2. This measures approximately 1.5 x 0.6 x 5.3 cm in greatest dimensions. Associated epidural enhancement throughout the lower thecal sac. Associated intradural component noted as well as evidence by clumping of the nerve roots of the cauda equina, most obvious at L3-4 (series 10, image 22). Few small loculated components noted inferiorly at the level of S2 (series 10, image 41). Additionally, now seen are paraspinous inflammatory changes, most pronounced within the medial left psoas muscle. Interval development of intramuscular abscesses, with the largest component measuring 2.5 cm (series 10, image 39). No other evidence for infection elsewhere within the lumbar spine. Vertebral body height maintained without acute or interval fracture. Bone marrow signal intensity heterogeneous without  worrisome osseous lesion. Conus medullaris and cauda equina: Conus extends to the L2 level. Conus within normal limits. Inflammatory changes about the distal nerve roots of the cauda equina related to osteomyelitis discitis. Paraspinal and other soft tissues: Paraspinous inflammatory changes with left psoas abscesses as above. Chronic postoperative scarring within the posterior paraspinous soft tissues. Approximate 1 cm simple left renal cyst, benign in appearance, no follow-up imaging recommended. Disc levels: L1-2: Mild disc desiccation with diffuse disc bulge. Superimposed left extraforaminal disc protrusion closely approximates the exiting left L1 nerve root (series 10, image 5). Mild facet hypertrophy. No significant stenosis. L2-3: Disc desiccation with mild diffuse disc bulge. Moderate facet and ligament flavum hypertrophy. Resultant moderate spinal stenosis with mild bilateral L2 foraminal narrowing. L3-4: Advanced degenerative intervertebral disc space narrowing with diffuse disc bulge. Associated reactive endplate change with marginal endplate osteophytic spurring. Changes are asymmetric to the left. Prior posterior decompression. Moderate facet hypertrophy. Residual moderate left lateral recess stenosis. Central canal remains patent. Moderate bilateral L3 foraminal narrowing. L4-5: Prior interbody fusion with chronic anterolisthesis. Reactive endplate spurring with advanced bilateral facet arthrosis. Resultant moderate to severe spinal stenosis, relatively similar to prior. Mild right L4 foraminal narrowing. Left neural foramen remains patent. L5-S1: Changes of osteomyelitis discitis as detailed above. Associated epidural extension with ventral epidural abscess. Severe right with moderate left facet arthrosis. Resultant moderate canal with moderate to severe bilateral lateral recess stenosis. Moderate right L5 foraminal narrowing. Left neural foramina remains patent. IMPRESSION: 1. Interval development of  acute osteomyelitis discitis at L5-S1. 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 Associated intradural component as above. 2. Associated paraspinous inflammatory changes with left psoas abscesses as above. 3. Underlying multilevel degenerative  spondylosis and facet arthrosis, most pronounced at L4-5 where there is resultant moderate to severe spinal stenosis. Electronically Signed   By: Rise Mu M.D.   On: 03/02/2023 21:19   [place] EKG SITE RITE  Result Date: 02/11/2023 If Site Rite image not attached, placement could not be confirmed due to current cardiac rhythm.  ECHOCARDIOGRAM COMPLETE  Result Date: 02/09/2023    ECHOCARDIOGRAM REPORT   Patient Name:   Brett Bender Date of Exam: 02/09/2023 Medical Rec #:  [id number]      Height:       76.0 in Accession #:    [id number]     Weight:       205.9 lb Date of Birth:  August 17, 1946     BSA:          2.243 m Patient Age:    76 years       BP:           121/69 mmHg Patient Gender: M              HR:           65 bpm. Exam Location:  [facility] Procedure: 2D Echo, Cardiac Doppler and Color Doppler Indications:     Bacteremia  History:         Patient has no prior history of Echocardiogram examinations.                  Signs/Symptoms:Bacteremia; Risk Factors:Hypertension.  Sonographer:     Mikki Harbor Referring Phys:  [id number] Sondra Barges Diagnosing Phys: Debbe Odea MD  Sonographer Comments: Image acquisition challenging due to respiratory motion. IMPRESSIONS  1. Left ventricular ejection fraction, by estimation, is 60 to 65%. The left ventricle has normal function. The left ventricle has no regional wall motion abnormalities. Left ventricular diastolic parameters are consistent with Grade I diastolic dysfunction (impaired relaxation).  2. Right ventricular systolic function is normal. The right ventricular size is normal. There is moderately elevated pulmonary artery systolic pressure.   3. The mitral valve is normal in structure. Trivial mitral valve regurgitation.  4. The aortic valve was not well visualized. Aortic valve regurgitation is trivial.  5. Aortic dilatation noted. There is mild dilatation of the aortic root, measuring 40 mm. FINDINGS  Left Ventricle: Left ventricular ejection fraction, by estimation, is 60 to 65%. The left ventricle has normal function. The left ventricle has no regional wall motion abnormalities. The left ventricular internal cavity size was normal in size. There is  no left ventricular hypertrophy. Left ventricular diastolic parameters are consistent with Grade I diastolic dysfunction (impaired relaxation). Right Ventricle: The right ventricular size is normal. No increase in right ventricular wall thickness. Right ventricular systolic function is normal. There is moderately elevated pulmonary artery systolic pressure. The tricuspid regurgitant velocity is 3.29 m/s, and with an assumed right atrial pressure of 8 mmHg, the estimated right ventricular systolic pressure is 51.3 mmHg. Left Atrium: Left atrial size was normal in size. Right Atrium: Right atrial size was normal in size. Pericardium: There is no evidence of pericardial effusion. Mitral Valve: The mitral valve is normal in structure. Trivial mitral valve regurgitation. MV peak gradient, 4.6 mmHg. The mean mitral valve gradient is 1.0 mmHg. Tricuspid Valve: The tricuspid valve is normal in structure. Tricuspid valve regurgitation is mild. Aortic Valve: The aortic valve was not well visualized. Aortic valve regurgitation is trivial. Aortic valve mean gradient measures 7.0 mmHg. Aortic valve peak gradient measures 13.1 mmHg. Aortic valve area,  by VTI measures 2.52 cm. Pulmonic Valve: The pulmonic valve was not well visualized. Pulmonic valve regurgitation is not visualized. Aorta: Aortic dilatation noted. There is mild dilatation of the aortic root, measuring 40 mm. Venous: The inferior vena cava was not well  visualized. IAS/Shunts: No atrial level shunt detected by color flow Doppler.  LEFT VENTRICLE PLAX 2D LVIDd:         5.10 cm   Diastology LVIDs:         3.20 cm   LV e' medial:    7.29 cm/s LV PW:         1.10 cm   LV E/e' medial:  9.4 LV IVS:        1.20 cm   LV e' lateral:   11.50 cm/s LVOT diam:     1.90 cm   LV E/e' lateral: 6.0 LV SV:         99 LV SV Index:   44 LVOT Area:     2.84 cm  RIGHT VENTRICLE RV Basal diam:  3.75 cm RV Mid diam:    3.00 cm RV S prime:     16.80 cm/s TAPSE (M-mode): 2.9 cm LEFT ATRIUM             Index        RIGHT ATRIUM           Index LA diam:        4.50 cm 2.01 cm/m   RA Area:     20.20 cm LA Vol (A2C):   84.6 ml 37.72 ml/m  RA Volume:   57.00 ml  25.41 ml/m LA Vol (A4C):   53.5 ml 23.85 ml/m LA Biplane Vol: 71.3 ml 31.79 ml/m  AORTIC VALVE                     PULMONIC VALVE AV Area (Vmax):    2.48 cm      PV Vmax:       1.03 m/s AV Area (Vmean):   2.25 cm      PV Peak grad:  4.2 mmHg AV Area (VTI):     2.52 cm AV Vmax:           181.00 cm/s AV Vmean:          125.000 cm/s AV VTI:            0.392 m AV Peak Grad:      13.1 mmHg AV Mean Grad:      7.0 mmHg LVOT Vmax:         158.00 cm/s LVOT Vmean:        99.100 cm/s LVOT VTI:          0.348 m LVOT/AV VTI ratio: 0.89  AORTA Ao Root diam: 4.00 cm Ao Asc diam:  3.60 cm MITRAL VALVE               TRICUSPID VALVE MV Area (PHT): 3.16 cm    TR Peak grad:   43.3 mmHg MV Area VTI:   3.08 cm    TR Vmax:        329.00 cm/s MV Peak grad:  4.6 mmHg MV Mean grad:  1.0 mmHg    SHUNTS MV Vmax:       1.07 m/s    Systemic VTI:  0.35 m MV Vmean:      52.1 cm/s   Systemic Diam: 1.90 cm MV Decel Time: 240 msec MV E velocity: 68.70 cm/s MV  A velocity: 99.00 cm/s MV E/A ratio:  0.69 Debbe Odea MD Electronically signed by Debbe Odea MD Signature Date/Time: 02/09/2023/1:38:38 PM    Final    MR Lumbar Spine W Wo Contrast  Result Date: 02/08/2023 CLINICAL DATA:  Septic with severe right-sided lumbar pain EXAM: MRI LUMBAR SPINE  WITHOUT AND WITH CONTRAST TECHNIQUE: Multiplanar and multiecho pulse sequences of the lumbar spine were obtained without and with intravenous contrast. CONTRAST:  10mL GADAVIST GADOBUTROL 1 MMOL/ML IV SOLN COMPARISON:  07/24/2018 FINDINGS: Segmentation: 5 non rib-bearing vertebral bodies. The lowest fully formed disc space is labeled L5-S1. Alignment: 5 mm anterolisthesis of L4 on L5 unchanged from prior exam. 4 mm retrolisthesis of L3 on L4 appears new from the prior exam. Mild dextrocurvature. Vertebrae: No acute fracture or suspicious osseous lesion. A small amount of enhancement is noted at the inferior aspect of T12, where a Schmorl's node appears to be forming, and about the L3-L4 disc space, which correlates with edema on the STIR and is favored to be related to adjacent degenerative changes. No evidence of discitis. Postsurgical changes at L3-L4 and L4-L5. Conus medullaris and cauda equina: Conus extends to the L2 level. Conus and cauda equina appear normal. No abnormal enhancement. No epidural collection. Paraspinal and other soft tissues: Negative. Disc levels: T12-L1: Minimal disc bulge. Mild facet arthropathy. No spinal canal stenosis or neural foraminal narrowing. L1-L2: No significant disc bulge. Mild facet arthropathy. No spinal canal stenosis or neural foraminal narrowing. L2-L3: Minimal disc bulge. Mild facet arthropathy. Ligamentum flavum hypertrophy. Mild spinal canal stenosis and mild bilateral neural foraminal narrowing, which have progressed from the prior exam. Narrowing of the lateral recesses. L3-L4: Mild retrolisthesis with disc osteophyte complex. Moderate facet arthropathy. Interval laminectomy. No residual spinal canal stenosis. Narrowing of the left lateral recess. Moderate left-greater-than-right neural foraminal narrowing, similar to prior L4-L5: Status post fusion. Grade 1 anterolisthesis with disc unroofing. Severe facet arthropathy. Moderate spinal canal stenosis, which has  progressed from the prior exam. Narrowing of the lateral recesses. No neural foraminal narrowing. L5-S1: No significant disc bulge. Possible granulation tissue along the anterior aspect of the thecal sac versus extruded disc. Moderate to severe right and mild left facet arthropathy. Mild spinal canal stenosis. Narrowing of the lateral recesses. Moderate right neural foraminal narrowing, which has progressed from the prior exam IMPRESSION: 1. No evidence of discitis or osteomyelitis. No epidural collection. 2. L4-L5 moderate spinal canal stenosis, which has progressed from the prior exam. 3. L5-S1 mild spinal canal stenosis and moderate right neural foraminal narrowing, which has progressed from the prior exam. 4. L3-L4 moderate left-greater-than-right neural foraminal narrowing. 5. L2-L3 mild spinal canal stenosis and mild bilateral neural foraminal narrowing. 6. Narrowing of the lateral recesses at L2-L3, L3-L4, L4-L5, and L5-S1 could affect the descending L3, L4, L5, and S1 nerve roots, respectively. Electronically Signed   By: Wiliam Ke M.D.   On: 02/08/2023 02:26   CT Renal Stone Study  Result Date: 02/08/2023 CLINICAL DATA:  Right back and flank pain.  Urolithiasis. EXAM: CT ABDOMEN AND PELVIS WITHOUT CONTRAST TECHNIQUE: Multidetector CT imaging of the abdomen and pelvis was performed following the standard protocol without IV contrast. RADIATION DOSE REDUCTION: This exam was performed according to the departmental dose-optimization program which includes automated exposure control, adjustment of the mA and/or kV according to patient size and/or use of iterative reconstruction technique. COMPARISON:  05/03/2019 FINDINGS: Lower chest: No acute abnormality. Extensive coronary artery calcification Hepatobiliary: No focal liver abnormality is seen. No gallstones,  gallbladder wall thickening, or biliary dilatation. Pancreas: Unremarkable Spleen: Unremarkable Adrenals/Urinary Tract: Right adrenal gland is  unremarkable. 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 No follow-up imaging is recommended for this lesion. The kidneys are normal in size and position. Multiple nonobstructing calculi are seen within the kidneys bilaterally measuring up to 8 mm within the upper pole of the right kidney and 2 mm within the interpolar region of the left kidney. Stone burden within the right kidney has progressed slightly since prior examination. No hydronephrosis. No ureteral calculi. Moderate bilateral nonspecific perinephric stranding is present, progressive since prior examination. No perinephric fluid collections are seen. The bladder is decompressed and is unremarkable. Stomach/Bowel: Stomach is within normal limits. Appendix appears normal. No evidence of bowel wall thickening, distention, or inflammatory changes. Vascular/Lymphatic: Aortic atherosclerosis. No enlarged abdominal or pelvic lymph nodes. Reproductive: Prostate is unremarkable. Other: Tiny umbilical hernia contains mesenteric fat as well as the distal tip of the appendix. Musculoskeletal: No acute bone abnormality. L4-5 anterior lumbar fusion procedure with instrumentation has been performed. No definite bridging callus identified. Advanced degenerative changes are seen at L3-4. No acute bone abnormality. No suspicious lytic or blastic bone lesion. Postsurgical changes are seen within the right iliac spine. IMPRESSION: 1. No acute intra-abdominal pathology identified. 2. Extensive coronary artery calcification. 3. 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 No follow-up imaging is recommended for this lesion. 4. Right greater than left nonobstructing nephrolithiasis. Stone burden within the right kidney has progressed slightly since prior examination. No urolithiasis. No hydronephrosis. 5. Progressive nonspecific bilateral perinephric stranding. Correlation with laboratory examination may be helpful to exclude an underlying infectious or  inflammatory process. Aortic Atherosclerosis (ICD10-I70.0). Electronically Signed   By: Helyn Numbers M.D.   On: 02/08/2023 01:11   CT Angio Chest PE W and/or Wo Contrast  Result Date: 02/08/2023 CLINICAL DATA:  Syncope. EXAM: CT ANGIOGRAPHY CHEST WITH CONTRAST TECHNIQUE: Multidetector CT imaging of the chest was performed using the standard protocol during bolus administration of intravenous contrast. Multiplanar CT image reconstructions and MIPs were obtained to evaluate the vascular anatomy. RADIATION DOSE REDUCTION: This exam was performed according to the departmental dose-optimization program which includes automated exposure control, adjustment of the mA and/or kV according to patient size and/or use of iterative reconstruction technique. CONTRAST:  60mL OMNIPAQUE IOHEXOL 350 MG/ML SOLN COMPARISON:  Chest radiograph dated 02/07/2023. FINDINGS: Evaluation of this exam is limited due to respiratory motion artifact. Cardiovascular: There is no cardiomegaly or pericardial effusion. Coronary vascular calcification of the LAD and left circumflex artery. Mild atherosclerotic calcification of the thoracic aorta. No aneurysmal dilatation. Evaluation of the pulmonary arteries is limited due to respiratory motion and suboptimal visualization of the peripheral branches. No large central pulmonary artery embolus identified. Mediastinum/Nodes: No hilar or mediastinal adenopathy. The esophagus is grossly unremarkable. No mediastinal fluid collection. Lungs/Pleura: Bibasilar dependent atelectasis. No focal consolidation, pleural effusion, or pneumothorax. The central airways are patent. Upper Abdomen: Fatty liver.  A 2 cm left adrenal adenoma. Musculoskeletal: No acute osseous pathology. Review of the MIP images confirms the above findings. IMPRESSION: 1. No acute intrathoracic pathology. No large central pulmonary artery embolus identified. 2. Coronary vascular calcification of the LAD and left circumflex artery. 3.  Fatty liver. 4. A 2 cm left adrenal adenoma. 5.  Aortic Atherosclerosis (ICD10-I70.0). Electronically Signed   By: Elgie Collard M.D.   On: 02/08/2023 01:02   CT HEAD WO CONTRAST ( )  Result Date: 02/08/2023 CLINICAL DATA:  Altered mental status EXAM:  CT HEAD WITHOUT CONTRAST TECHNIQUE: Contiguous axial images were obtained from the base of the skull through the vertex without intravenous contrast. RADIATION DOSE REDUCTION: This exam was performed according to the departmental dose-optimization program which includes automated exposure control, adjustment of the mA and/or kV according to patient size and/or use of iterative reconstruction technique. COMPARISON:  01/05/2020 FINDINGS: Brain: Normal anatomic configuration. Parenchymal volume loss is commensurate with the patient's age. Mild periventricular white matter changes are present likely reflecting the sequela of small vessel ischemia. No abnormal intra or extra-axial mass lesion or fluid collection. No abnormal mass effect or midline shift. No evidence of acute intracranial hemorrhage or infarct. Ventricular size is normal. Cerebellum unremarkable. Vascular: No asymmetric hyperdense vasculature at the skull base. Skull: Intact Sinuses/Orbits: Paranasal sinuses are clear. Orbits are unremarkable. Other: Mastoid air cells and middle ear cavities are clear. IMPRESSION: 1. No acute intracranial hemorrhage or infarct. 2. Mild senescent change. Electronically Signed   By: Helyn Numbers M.D.   On: 02/08/2023 01:01   DG Chest Port 1 View  Result Date: 02/07/2023 CLINICAL DATA:  Opiate overdose, lethargy EXAM: PORTABLE CHEST 1 VIEW COMPARISON:  06/28/2019 FINDINGS: Single frontal view of the chest demonstrates an unremarkable cardiac silhouette. Continued ectasia of the thoracic aorta, likely not appreciably changed allowing for differences due to portable technique and AP positioning. Lung volumes are diminished, with patchy left basilar consolidation  concerning for aspiration given clinical presentation. No effusion or pneumothorax. IMPRESSION: 1. Patchy left basilar consolidation, which could reflect hypoventilatory changes or aspiration given clinical presentation. 2. Stable thoracic aortic ectasia. Electronically Signed   By: Sharlet Salina M.D.   On: 02/07/2023 23:54    Labs:  CBC: Recent Labs    02/12/23 0523 02/23/23 1103 03/02/23 2125 03/03/23 0446  WBC 17.4* 10.9* 14.7* 11.7*  HGB 14.2 14.1 12.7* 12.0*  HCT 41.1 40.3 38.8* 36.9*  PLT 169 677* 308 300    COAGS: Recent Labs    02/07/23 2335 03/02/23 2125  INR 1.2 1.1  APTT 32 32    BMP: Recent Labs    02/10/23 0506 02/11/23 0509 02/23/23 1103 03/02/23 2125 03/03/23 0446  NA 137 140 137 133* 134*  K 3.2* 3.6 4.9 3.5 3.6  CL 103 107 96 99 101  CO2 24 26 27 25 27   GLUCOSE 120* 133* 113* 122* 100*  BUN 43* 27* 26 21 15   CALCIUM 8.2* 8.4* 10.4* 8.7* 8.3*  CREATININE 1.28* 0.96 1.06 0.80 0.78  GFRNONAA 58* >60  --  >60 >60    LIVER FUNCTION TESTS: Recent Labs    02/09/23 0533 02/23/23 1103 03/02/23 2125 03/03/23 0446  BILITOT 1.1 0.5 0.7 0.6  AST 27 21 14* 15  ALT 29 16 6 6   ALKPHOS 73 123* 79 72  PROT 6.1* 8.1 7.1 6.6  ALBUMIN 2.7* 4.0 3.0* 2.8*   Assessment and Plan: This is a 77 year old male with PMHx significant for previous back surgery L3-L4 fusion in 2010 and decompression in 2019, BPH, BCC multiple sites, kidney stones, HTN, and recent MSSA bacteremia treated on April 1 with hospital admission and has had PICC line with antibiotic therapy x 2 weeks. Patient was complaining of worsening lower back pain and MRI was done 4/24 with evidence of L5-S1 discitis and extension of fluid into epidural space concerning for abscess. The patient was sent to the ED and has been evaluated by neurosurgery. Request received for IR evaluation.   The patient has been NPO,on Asprin 81  mg last dose yesterday, imaging, labs and vitals have been reviewed.  Risks  and benefits of image guided L5-S1 disc and epidural fluid aspiration with moderate sedation was discussed with the patient and patient's family including, but not limited to bleeding- increased bleeding risk due to aspirin, infection, damage to adjacent structures or low yield requiring additional tests.  All of the questions were answered and there is agreement to proceed.  Consent signed and in chart.   Thank you for this interesting consult.  I greatly enjoyed meeting KAIRAV RUSSOMANNO and look forward to participating in their care.  A copy of this report was sent to the requesting provider on this date.  Electronically Signed: Berneta Levins, PA-C 03/03/2023, 9:26 AM   I spent a total of 20 Minutes in face to face in clinical consultation, greater than 50% of which was counseling/coordinating care for L5-S1 discitis with epidural extension and abscess.

## 2023-03-03 NOTE — ED Notes (Signed)
Patient moved onto hospital bed for comfort.

## 2023-03-04 ENCOUNTER — Ambulatory Visit: Payer: Self-pay | Admitting: *Deleted

## 2023-03-04 DIAGNOSIS — B9561 Methicillin susceptible Staphylococcus aureus infection as the cause of diseases classified elsewhere: Secondary | ICD-10-CM | POA: Diagnosis not present

## 2023-03-04 DIAGNOSIS — M4627 Osteomyelitis of vertebra, lumbosacral region: Secondary | ICD-10-CM | POA: Diagnosis not present

## 2023-03-04 DIAGNOSIS — M4626 Osteomyelitis of vertebra, lumbar region: Secondary | ICD-10-CM

## 2023-03-04 DIAGNOSIS — G062 Extradural and subdural abscess, unspecified: Secondary | ICD-10-CM | POA: Diagnosis not present

## 2023-03-04 DIAGNOSIS — M4646 Discitis, unspecified, lumbar region: Secondary | ICD-10-CM | POA: Diagnosis not present

## 2023-03-04 LAB — CBC WITH DIFFERENTIAL/PLATELET
Abs Immature Granulocytes: 0.04 10*3/uL (ref 0.00–0.07)
Basophils Absolute: 0.1 10*3/uL (ref 0.0–0.1)
Basophils Relative: 1 %
Eosinophils Absolute: 0.2 10*3/uL (ref 0.0–0.5)
Eosinophils Relative: 1 %
HCT: 36.3 % — ABNORMAL LOW (ref 39.0–52.0)
Hemoglobin: 12.2 g/dL — ABNORMAL LOW (ref 13.0–17.0)
Immature Granulocytes: 0 %
Lymphocytes Relative: 33 %
Lymphs Abs: 3.7 10*3/uL (ref 0.7–4.0)
MCH: 31 pg (ref 26.0–34.0)
MCHC: 33.6 g/dL (ref 30.0–36.0)
MCV: 92.1 fL (ref 80.0–100.0)
Monocytes Absolute: 1.3 10*3/uL — ABNORMAL HIGH (ref 0.1–1.0)
Monocytes Relative: 11 %
Neutro Abs: 6.1 10*3/uL (ref 1.7–7.7)
Neutrophils Relative %: 54 %
Platelets: 301 10*3/uL (ref 150–400)
RBC: 3.94 MIL/uL — ABNORMAL LOW (ref 4.22–5.81)
RDW: 12.9 % (ref 11.5–15.5)
WBC: 11.2 10*3/uL — ABNORMAL HIGH (ref 4.0–10.5)
nRBC: 0 % (ref 0.0–0.2)

## 2023-03-04 LAB — BASIC METABOLIC PANEL
Anion gap: 6 (ref 5–15)
BUN: 10 mg/dL (ref 8–23)
CO2: 31 mmol/L (ref 22–32)
Calcium: 8.8 mg/dL — ABNORMAL LOW (ref 8.9–10.3)
Chloride: 96 mmol/L — ABNORMAL LOW (ref 98–111)
Creatinine, Ser: 0.6 mg/dL — ABNORMAL LOW (ref 0.61–1.24)
GFR, Estimated: >60 mL/min (ref >60)
Glucose, Bld: 103 mg/dL — ABNORMAL HIGH (ref 70–99)
Potassium: 3.3 mmol/L — ABNORMAL LOW (ref 3.5–5.1)
Sodium: 133 mmol/L — ABNORMAL LOW (ref 135–145)

## 2023-03-04 LAB — AEROBIC/ANAEROBIC CULTURE W GRAM STAIN (SURGICAL/DEEP WOUND)

## 2023-03-04 LAB — CULTURE, BLOOD (ROUTINE X 2)

## 2023-03-04 MED ORDER — ENOXAPARIN SODIUM 40 MG/0.4ML IJ SOSY
40.0000 mg | PREFILLED_SYRINGE | Freq: Every day | INTRAMUSCULAR | Status: DC
  Administered 2023-03-04 – 2023-03-07 (×4): 40 mg via SUBCUTANEOUS
  Filled 2023-03-04 (×4): qty 0.4

## 2023-03-04 MED ORDER — MIRTAZAPINE 15 MG PO TABS
30.0000 mg | ORAL_TABLET | Freq: Every day | ORAL | Status: DC
  Administered 2023-03-04 – 2023-03-07 (×4): 30 mg via ORAL
  Filled 2023-03-04 (×4): qty 2

## 2023-03-04 MED ORDER — DOCUSATE SODIUM 100 MG PO CAPS
100.0000 mg | ORAL_CAPSULE | Freq: Two times a day (BID) | ORAL | Status: DC
  Administered 2023-03-04 – 2023-03-08 (×8): 100 mg via ORAL
  Filled 2023-03-04 (×8): qty 1

## 2023-03-04 NOTE — Progress Notes (Addendum)
[facility] Infectious Diseases Follow Up Note  Patient Identification: Patient Name: Brett Bender MRN: [id number] Admit Date: 03/02/2023  8:48 PM Age: 77 y.o.Today's Date: 03/04/2023  Reason for Visit: Vertebral infection, MSSA bacteremia  Principal Problem:   Epidural abscess Active Problems:   Discitis of lumbar region  Antibiotics:  Vancomycin 4/24 Ceftriaxone 4/24 Cefazolin prior to admit, 4/25-c  Lines/Hardware: PICC rt arm   Interval Events: Afebrile, status post IR L5-S1 disc aspirate yesterday   Assessment 77 year old male with PMH of BCC, BPH, renal stones ( cystoscopy and left ureteral stent placement 2017), prior cervical spine surgery 35 years ago per his reports. 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  Or culture pending, no organisms on Gram stain   # Recent MSSA bacteremia - Presumed to be skin source. blood cx cleared 4/3. Wound at the back 4/2 cx MSSA. TTE 4/3 no vegetations/endocarditis. TEE not done due to risk. Planned to continue cefazolin until 5/15   Recommendations Continue cefazolin Follow-up this aspirate as well as blood cultures Monitor CBC and BMP Fu aspirate cx for final recs on abtx Will need 6 weeks IV course from 4/25  Pain control per primary  I am available remotely this weekend,  Dr Thedore Mins here on Monday   Rest of the management as per the primary team. Thank you for the consult. Please page with pertinent questions or concerns.  ______________________________________________________________________ Subjective patient seen and examined at the bedside.   Still has back pain.  Denies nausea, vomiting or diarrhea  Vitals BP (!) 152/91 (BP Location: Left Arm)   Pulse 66   Temp (!) 97.5 F (36.4 C) (Oral)   Resp 18   Ht 6\' 3"  (1.905 m)   Wt 89.3 kg   SpO2 98%   BMI 24.61 kg/m     Physical Exam Constitutional: Adult male sitting in the recliner, not in distress    Comments:   Cardiovascular:     Rate and Rhythm: Normal rate and regular rhythm.     Heart sounds:   Pulmonary:     Effort: Pulmonary effort is normal.     Comments:   Abdominal:     Palpations: Abdomen is soft.     Tenderness: Nondistended  Musculoskeletal:        General: No swelling or tenderness in peripheral joints  Skin:    Comments: No rashes, right arm PICC okay  Neurological:     General: Awake, alert and oriented  Psychiatric:        Mood and Affect: Mood normal.   Pertinent Microbiology Results for orders placed or performed during the hospital encounter of 03/02/23  Culture, blood (routine x 2)     Status: None (Preliminary result)   Collection Time: 03/02/23  9:25 PM   Specimen: BLOOD  Result Value Ref Range Status   Specimen Description BLOOD LEFT FOREARM  Final   Special Requests   Final    BOTTLES DRAWN AEROBIC AND ANAEROBIC Blood Culture results may not be optimal due to an excessive volume of blood received in culture bottles   Culture   Final    NO GROWTH 2 DAYS Performed at [facility], [address], [place], [place] [address]  Report Status PENDING  Incomplete  Culture, blood (routine x 2)     Status: None (Preliminary result)   Collection Time: 03/02/23  9:36 PM   Specimen: BLOOD  Result Value Ref Range Status   Specimen Description BLOOD LEFT ASSIST CONTROL  Final   Special Requests   Final    BOTTLES DRAWN AEROBIC AND ANAEROBIC Blood Culture results may not be optimal due to an excessive volume of blood received in culture bottles   Culture   Final    NO GROWTH 2 DAYS Performed at [facility],  [address], [place], [place] [address]    Report Status PENDING  Incomplete  Aerobic/Anaerobic Culture w Gram Stain (surgical/deep wound)     Status: None (Preliminary result)   Collection Time: 03/03/23  3:44 PM   Specimen: Abscess  Result Value Ref Range Status   Specimen Description   Final    ABSCESS Performed at [facility], [address] [place], [place] [address]    Special Requests   Final    [facility] Performed at [facility], [address] [place], [place] [address]    Gram Stain   Final    MODERATE WBC PRESENT,BOTH PMN AND MONONUCLEAR NO ORGANISMS SEEN Performed at [facility], [address], [place], [place] [address]    Culture PENDING  Incomplete   Report Status PENDING  Incomplete     Pertinent lab    Latest Ref Rng & Units 03/04/2023    5:17 AM 03/03/2023    4:46 AM 03/02/2023    9:25 PM  CBC  WBC 4.0 - 10.5 K/uL 11.2  11.7  14.7   Hemoglobin 13.0 - 17.0 g/dL 29.5  62.1  30.8   Hematocrit 39.0 - 52.0 % 36.3  36.9  38.8   Platelets 150 - 400 K/uL 301  300  308       Latest Ref Rng & Units 03/04/2023    5:17 AM 03/03/2023    4:46 AM 03/02/2023    9:25 PM  CMP  Glucose 70 - 99 mg/dL 657  846  962   BUN 8 - 23 mg/dL 10  15  21    Creatinine 0.61 - 1.24 mg/dL 9.52  8.41  3.24   Sodium 135 - 145 mmol/L 133  134  133   Potassium 3.5 - 5.1 mmol/L 3.3  3.6  3.5   Chloride 98 - 111 mmol/L 96  101  99   CO2 22 - 32 mmol/L 31  27  25    Calcium 8.9 - 10.3 mg/dL 8.8  8.3  8.7   Total Protein 6.5 - 8.1 g/dL  6.6  7.1   Total Bilirubin 0.3 - 1.2 mg/dL  0.6  0.7   Alkaline Phos 38 - 126 U/L  72  79   AST 15 - 41 U/L  15  14   ALT 0 - 44 U/L  6  6      Pertinent Imaging today Plain films and CT images have been personally visualized and interpreted; radiology reports have been reviewed. Decision making incorporated into the Impression /   IR LUMBAR DISC ASPIRATION W/IMG GUIDE  Result Date: 03/03/2023 INDICATION: 77 year old  male with osteomyelitis/discitis of L5-S1 referred for aspiration EXAM: IMAGE GUIDED DISC ASPIRATION AT L5-S1 MEDICATIONS: None ANESTHESIA/SEDATION: Fentanyl 100 mcg IV; Versed 1.5 mg IV Moderate Sedation Time:  10 minutes The patient was continuously monitored during the procedure by the interventional radiology nurse under my  direct supervision. COMPLICATIONS: None PROCEDURE: Informed written consent was obtained from the patient after a thorough discussion of the procedural risks, benefits and alternatives. All questions were addressed. Maximal Sterile Barrier Technique was utilized including caps, mask, sterile gowns, sterile gloves, sterile drape, hand hygiene and skin antiseptic. A timeout was performed prior to the initiation of the procedure. Patient was position prone on the image intensifier table. Physical exam was used to localize the L5 level. The patient was then prepped and draped in the usual sterile fashion using Betadine. 1% lidocaine was used for local anesthesia. Using fluoroscopy, 17 gauge guide needle was advanced from the left side into the disc space of L5-S1. Once we confirmed needle tip position, we aspirated approximately 3 cc of frankly purulent material. Sample sent for culture. Needle was removed. Sterile bandage was placed. Patient tolerated the procedure well and remained hemodynamically stable throughout. No complications were encountered and no significant blood loss. IMPRESSION: Status post image guided L5-S1 disc aspiration Signed, Yvone Neu. Miachel Roux, RPVI [facility] Radiology Electronically Signed   By: Gilmer Mor D.O.   On: 03/03/2023 16:21    I have personally spent at least 36 minutes involved in face-to-face and non-face-to-face activities for this patient on the day of the visit. Professional time spent includes the following activities: Preparing to see the patient (review of tests), Obtaining and/or reviewing  separately obtained history (admission/discharge record), Performing a medically appropriate examination and/or evaluation , Ordering medications/tests/procedures, referring and communicating with other health care professionals, Documenting clinical information in the EMR, Independently interpreting results (not separately reported), Communicating results to the patient/family/caregiver, Counseling and educating the patient/family/caregiver and Care coordination (not separately reported).   Plan d/w requesting provider as well as ID pharm D  Note: This document was prepared using dragon voice recognition software and may include unintentional dictation errors.   Electronically signed by:   Odette Fraction, MD Infectious Disease Physician [facility] Pager: [phone]

## 2023-03-04 NOTE — Care Management Important Message (Signed)
Important Message  Patient Details  Name: Brett Bender MRN: [id number] Date of Birth: 11/23/1945   Medicare Important Message Given:  Yes  Patient asleep upon time of visit, no family in room.  Copy of Medicare IM left in room on bedside tray for reference.   Johnell Comings 03/04/2023, 12:02 PM

## 2023-03-04 NOTE — Patient Outreach (Addendum)
  Care Coordination   Initial Visit Note   03/04/2023 Name: BERT PTACEK MRN: [id number] DOB: [birth year]  DRAVON NOTT is a 77 y.o. year old male who sees Fisher, Demetrios Isaacs, MD for primary care.   Patient scheduled for outreach today, however noted that he is currently admitted to hospital.  Notified hospital liaison and [facility] nurse of admission.     SDOH assessments and interventions completed:  No     Care Coordination Interventions:  Yes, provided   Interventions Today    Flowsheet Row Most Recent Value  General Interventions   General Interventions Discussed/Reviewed Communication with  Communication with RN  [Hospital liaison and TOC nurse notified of admission]        Follow up plan: Follow up call scheduled for pending hospital discharge.     Encounter Outcome:  Pt. Visit Completed   Kemper Durie, RN, MSN, [facility] Care Management Care Management Coordinator [phone]

## 2023-03-04 NOTE — Progress Notes (Signed)
Physical Therapy Treatment Patient Details Name: Brett Bender MRN: [id number] DOB: 02-Dec-1945 Today's Date: 03/04/2023   History of Present Illness Patient is a 77 year old male with complaints of severe progressive back pain. Known MSSA bacteremia with MRI reporting new discitis osteomyelitis. History of lumbar and cervical spine surgeries.    PT Comments    Patient received in bed, he is agreeable to get up and try to walk some. Patient's wife on her way out on my arrival. He is mod I with bed mobility. Transfers sit to stand with min guard. Patient is able to ambulate 25 feet in room with RW and min guard. He is weak and slightly impulsive/decreased safety  at times with mobility. (Leaving walker and reaching for chair while catheter is still on walker.)  He will continue to benefit from skilled PT to improve strength and safety with mobility.    Recommendations for follow up therapy are one component of a multi-disciplinary discharge planning process, led by the attending physician.  Recommendations may be updated based on patient status, additional functional criteria and insurance authorization.  Follow Up Recommendations       Assistance Recommended at Discharge Frequent or constant Supervision/Assistance  Patient can return home with the following A little help with walking and/or transfers;A little help with bathing/dressing/bathroom;Assist for transportation;Help with stairs or ramp for entrance;Assistance with cooking/housework   Equipment Recommendations  None recommended by PT    Recommendations for Other Services       Precautions / Restrictions Precautions Precautions: Fall Precaution Comments: general spine precuations for comfort Restrictions Weight Bearing Restrictions: No     Mobility  Bed Mobility Overal bed mobility: Modified Independent Bed Mobility: Supine to Sit     Supine to sit: Modified independent (Device/Increase time), HOB elevated           Transfers Overall transfer level: Needs assistance Equipment used: Rolling walker (2 wheels) Transfers: Sit to/from Stand Sit to Stand: Min guard           General transfer comment: min guard for safety sit to stand from bed and recliner    Ambulation/Gait Ambulation/Gait assistance: Min guard Gait Distance (Feet): 20 Feet Assistive device: Rolling walker (2 wheels) Gait Pattern/deviations: Step-through pattern, Decreased step length - right, Decreased step length - left, Decreased dorsiflexion - right, Decreased dorsiflexion - left Gait velocity: decr     General Gait Details: patient able to ambulat in room with RW and min guard. Patient has decreased safety awareness with lines. Patient reports LE weakness during mobility   Stairs             Wheelchair Mobility    Modified Rankin (Stroke Patients Only)       Balance Overall balance assessment: Needs assistance Sitting-balance support: Feet supported Sitting balance-Leahy Scale: Good     Standing balance support: Bilateral upper extremity supported, During functional activity, Reliant on assistive device for balance Standing balance-Leahy Scale: Fair                              Cognition Arousal/Alertness: Awake/alert Behavior During Therapy: WFL for tasks assessed/performed, Impulsive Overall Cognitive Status: Within Functional Limits for tasks assessed                                          Exercises  General Comments        Pertinent Vitals/Pain Pain Assessment Pain Assessment: Faces Faces Pain Scale: Hurts little more Pain Location: low back Pain Descriptors / Indicators: Discomfort, Grimacing Pain Intervention(s): Monitored during session, Repositioned    Home Living                          Prior Function            PT Goals (current goals can now be found in the care plan section) Acute Rehab PT Goals Patient Stated Goal: pain  control PT Goal Formulation: With patient Time For Goal Achievement: 03/17/23 Potential to Achieve Goals: Fair Progress towards PT goals: Progressing toward goals    Frequency    Min 3X/week      PT Plan Current plan remains appropriate    Co-evaluation              AM-PAC PT "6 Clicks" Mobility   Outcome Measure  Help needed turning from your back to your side while in a flat bed without using bedrails?: None Help needed moving from lying on your back to sitting on the side of a flat bed without using bedrails?: None Help needed moving to and from a bed to a chair (including a wheelchair)?: A Little Help needed standing up from a chair using your arms (e.g., wheelchair or bedside chair)?: A Little Help needed to walk in hospital room?: A Little Help needed climbing 3-5 steps with a railing? : A Lot 6 Click Score: 19    End of Session Equipment Utilized During Treatment: Gait belt Activity Tolerance: Patient limited by fatigue Patient left: in chair;with call bell/phone within reach;with chair alarm set Nurse Communication: Mobility status PT Visit Diagnosis: Other abnormalities of gait and mobility (R26.89);Pain;Difficulty in walking, not elsewhere classified (R26.2);Muscle weakness (generalized) (M62.81) Pain - part of body:  (back)     Time: 1010-1027 PT Time Calculation (min) (ACUTE ONLY): 17 min  Charges:  $Gait Training: 8-22 mins                     Chellsea Beckers, PT, GCS 03/04/23,11:26 AM

## 2023-03-04 NOTE — Progress Notes (Signed)
Progress Note   Patient: Brett Bender:[id number] DOB: 1946/04/02 DOA: 03/02/2023     2 DOS: the patient was seen and examined on 03/04/2023   Subjective:  Patient seen and examined at bedside this morning He tells me his back pain is improving Status post IR guided drainage Denies abdominal pain chest pain or cough     Brief hospital course: Brett Bender is a 77 y.o. male with medical history significant of  renal stones, BCC, BPH, djd of the spine(history of lumbar surgery L3-L4 decompression done by Dr. Adriana Simas in 2019,lumbar fusion in 2010, as well as cervical spine surgery.) , depression, hypertension with recent interim history of admission 02/07/2023 with discharge 02/13/2023 with diagnosis of sepsis due to MSSA bacteremia  from skin wounds,as well as initially presenting with accidental opiate overdose. Patient was discharged with cefazolin iv , recent f/u with ID rec stop date 03/23/2023.  Patient present to ED after MRI ordered after seen ID on 4/23 due to progressive lower back pain. Out patient MRI completed today noted epidural abscess. Patient presents for further treatment and neurosurgery care.  Patient noted difficulty with urination however has  had this problem since hospitalization and this was noted to be related to history of BPH.  Patient notes no fever/chills / n/v/d/ diarrhea, weakness in lower extremities , bladder or bowel incontinence or saddle anesthesia. He notes paresthesia in right lower extremity but this is unchanged since last hospitalization.     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 Adriana Simas in 2019 -history of lumbar fusion in 2010, as well as cervical spine surgery. -Right lower extremity paresthesias  - persistent since prior hospitalization  - decrease ability to plantar flex  present x years  PT OT on board   HTN -resumed amlodipine      Glaucoma  -resumed eye drops   Hyperlipidemia -continue on crestor    Urinary retention  BPH - foley place  - voiding trial as able  - finasteride -out patient urology consult once stable    DVT prophylaxis: SCDs   Code Status: full code   Consults: Neuro surgery Dr Marcell Barlow, Infectious disease, interventional radiology   Physical Exam: General: Appears to be in some distress on account of back pain Neck: normal, supple, no masses, no thyromegaly Respiratory: clear to auscultation bilaterally, Cardiovascular: Regular rate and rhythm, no murmurs / rubs / gallops.  Abdomen: no tenderness, no masses palpated.  Musculoskeletal: no clubbing / cyanosis. No joint deformity upper and lower extremities. Good ROM, no contractures. Normal muscle tone.  Skin: Hyper zoster rash noted to the lower back Neurologic: CN 2-12 grossly intact. Sensation intact, . Strength 5/5 in all 4 except right lower ext 4/5 with decrease ability to plantar flex. Psychiatric: Normal judgment and insight.          Vitals:  03/03/23 1100 03/03/23 1130 03/03/23 1234 03/03/23 1446  BP: 104/89 (!) 155/88 (!) 148/84 (!) 155/88  Pulse: 66 76 63 69  Resp: 13 (!) 23 20 17   Temp:     97.8 F (36.6 C) 97.8 F (36.6 C)  TempSrc:       Oral  SpO2: 95% 94% 96% 95%  Weight:     86.9 kg    Height:               Data Reviewed: Have personally reviewed patient's laboratory results today showing WBC 11.7 hemoglobin 12.0   Family Communication: Discussed with wife present at bedside   Disposition: Status is: Inpatient Remains inpatient appropriate because: Continues to require IV antibiotics with several consultants input including infectious disease, neurosurgeon as well as interventional radiologist   Time spent: 50 minutes      Vitals:   03/04/23 0333 03/04/23 0500 03/04/23 0906 03/04/23 1257  BP: (!) 157/92  (!) 152/91 (!) 160/83  Pulse: 68  66 62  Resp: 18  18 18   Temp: 97.7 F (36.5 C)  (!) 97.5 F (36.4 C) 97.8 F (36.6 C)  TempSrc: Oral  Oral Oral  SpO2: 99%  98% 96%  Weight:  89.3 kg    Height:        Author: Loyce Dys, MD 03/04/2023 4:28 PM  For on call review [email or web address].

## 2023-03-04 NOTE — Plan of Care (Signed)

## 2023-03-04 NOTE — Consult Note (Addendum)
   [facility] Inpatient Consult   03/04/2023  Brett Bender 04-20-1946 [id number]  Primary Care Provider:   Dr. [name] [facility]).   Patient is currently active with [facility] Care Management for chronic disease management services.  Patient has been engaged by a [facility] care coordinator.  Our community based plan of care has focused on disease management and community resource support.  [facility] hospital liaison will collaborate with involved [facility] team members.  Patient will receive a post hospital call and will be evaluated for assessments and disease process education.   Plan: pending disposition  Inpatient Transition Of Care [TOC] team member to make aware that [facility] Care Management following.  Of note, [facility] Care Management services does not replace or interfere with any services that are needed or arranged by inpatient [facility] care management team.   For additional questions or referrals please contact:  Elliot Cousin, RN, BSN [facility] Liaison [place]   [facility]  Population Health Office Hours MTWF 8:00 am to 6 pm off on Thursday [phone] mobile [phone] [Office toll free line][facility] Office Hours are M-F 8:30 - 5 pm 24 hour nurse advise line [phone] Conceirge  [email or web address]

## 2023-03-04 NOTE — Progress Notes (Signed)
    Attending Progress Note  History: Brett Bender is here for discitis.  03/04/2023: He maintains perineal sensation.  He is able to feel his Foley catheter.  He is back pain is either stable or slightly better.  Physical Exam: Vitals:   03/04/23 0333 03/04/23 0906  BP: (!) 157/92 (!) 152/91  Pulse: 68 66  Resp: 18 18  Temp: 97.7 F (36.5 C) (!) 97.5 F (36.4 C)  SpO2: 99% 98%    AA Ox3 CNI  Strength:5/5 throughout BLE except R ankle dorsiflexor 4/5  Data:  Other tests/results: Cultures pending  Assessment/Plan:  Brett Bender is here for discitis osteomyelitis with abscess formation.  -Continue antibiotics per infectious disease. -He is neurologically stable at this time.  I would not recommend surgical intervention at this time. -Okay to mobilize - pain control - DVT prophylaxis - PTOT   Venetia Night MD, [facility] Department of Neurosurgery

## 2023-03-05 DIAGNOSIS — G062 Extradural and subdural abscess, unspecified: Secondary | ICD-10-CM | POA: Diagnosis not present

## 2023-03-05 LAB — CBC WITH DIFFERENTIAL/PLATELET
Abs Immature Granulocytes: 0.04 10*3/uL (ref 0.00–0.07)
Basophils Absolute: 0.1 10*3/uL (ref 0.0–0.1)
Basophils Relative: 1 %
Eosinophils Absolute: 0.3 10*3/uL (ref 0.0–0.5)
Eosinophils Relative: 2 %
HCT: 39.2 % (ref 39.0–52.0)
Hemoglobin: 12.8 g/dL — ABNORMAL LOW (ref 13.0–17.0)
Immature Granulocytes: 0 %
Lymphocytes Relative: 33 %
Lymphs Abs: 3.9 10*3/uL (ref 0.7–4.0)
MCH: 30.1 pg (ref 26.0–34.0)
MCHC: 32.7 g/dL (ref 30.0–36.0)
MCV: 92.2 fL (ref 80.0–100.0)
Monocytes Absolute: 1.2 10*3/uL — ABNORMAL HIGH (ref 0.1–1.0)
Monocytes Relative: 10 %
Neutro Abs: 6.3 10*3/uL (ref 1.7–7.7)
Neutrophils Relative %: 54 %
Platelets: 318 10*3/uL (ref 150–400)
RBC: 4.25 MIL/uL (ref 4.22–5.81)
RDW: 13.1 % (ref 11.5–15.5)
WBC: 11.7 10*3/uL — ABNORMAL HIGH (ref 4.0–10.5)
nRBC: 0 % (ref 0.0–0.2)

## 2023-03-05 LAB — BASIC METABOLIC PANEL
Anion gap: 7 (ref 5–15)
BUN: 14 mg/dL (ref 8–23)
CO2: 31 mmol/L (ref 22–32)
Calcium: 9 mg/dL (ref 8.9–10.3)
Chloride: 98 mmol/L (ref 98–111)
Creatinine, Ser: 0.81 mg/dL (ref 0.61–1.24)
GFR, Estimated: >60 mL/min (ref >60)
Glucose, Bld: 105 mg/dL — ABNORMAL HIGH (ref 70–99)
Potassium: 3.4 mmol/L — ABNORMAL LOW (ref 3.5–5.1)
Sodium: 136 mmol/L (ref 135–145)

## 2023-03-05 MED ORDER — HYDROMORPHONE HCL 1 MG/ML IJ SOLN: 1.0000 mg | INTRAMUSCULAR | Status: DC | PRN

## 2023-03-05 MED ORDER — POTASSIUM CHLORIDE CRYS ER 20 MEQ PO TBCR
40.0000 meq | EXTENDED_RELEASE_TABLET | Freq: Once | ORAL | Status: AC
  Administered 2023-03-05: 40 meq via ORAL
  Filled 2023-03-05: qty 2

## 2023-03-05 MED ORDER — MORPHINE SULFATE (PF) 2 MG/ML IV SOLN
1.0000 mg | INTRAVENOUS | Status: DC | PRN
  Administered 2023-03-05 – 2023-03-07 (×3): 1 mg via INTRAVENOUS
  Filled 2023-03-05 (×3): qty 1

## 2023-03-05 NOTE — TOC Transition Note (Signed)
Transition of Care [facility]) - CM/SW Discharge Note   Patient Details  Name: Brett Bender MRN: [id number] Date of Birth: [birth year]  Transition of Care [facility]) CM/SW Contact:  Susa Simmonds, LCSWA Phone Number: 03/05/2023, 12:26 PM   Clinical Narrative:  CSW confirmed with Barbara Cower from [facility] that patient is currently active with Home Health RN/PT services.           Patient Goals and CMS Choice      Discharge Placement                         Discharge Plan and Services Additional resources added to the After Visit Summary for                                       Social Determinants of Health (SDOH) Interventions SDOH Screenings   Food Insecurity: No Food Insecurity (03/03/2023)  Housing: Low Risk  (03/03/2023)  Transportation Needs: No Transportation Needs (03/03/2023)  Utilities: Not At Risk (03/03/2023)  Alcohol Screen: Low Risk  (01/04/2023)  Depression (PHQ2-9): High Risk (02/23/2023)  Financial Resource Strain: Low Risk  (01/04/2023)  Physical Activity: Sufficiently Active (01/04/2023)  Social Connections: Moderately Integrated (01/04/2023)  Stress: No Stress Concern Present (01/04/2023)  Tobacco Use: Low Risk  (03/03/2023)     Readmission Risk Interventions     No data to display

## 2023-03-05 NOTE — Plan of Care (Signed)

## 2023-03-05 NOTE — Progress Notes (Signed)
Progress Note   Patient: Brett Bender WUJ:[id number] DOB: 1946/09/14 DOA: 03/02/2023     3 DOS: the patient was seen and examined on 03/05/2023     Subjective:  Patient seen and examined at bedside this morning He tells me he is sleeping a lot after the pain medication I have switched Dilaudid to morphine today Denies abdominal pain chest pain or cough     Brief hospital course: Brett Bender is a 77 y.o. male with medical history significant of  renal stones, BCC, BPH, djd of the spine(history of lumbar surgery L3-L4 decompression done by Dr. Adriana Simas in 2019,lumbar fusion in 2010, as well as cervical spine surgery.) , depression, hypertension with recent interim history of admission 02/07/2023 with discharge 02/13/2023 with diagnosis of sepsis due to MSSA bacteremia  from skin wounds,as well as initially presenting with accidental opiate overdose. Patient was discharged with cefazolin iv , recent f/u with ID rec stop date 03/23/2023.  Patient present to ED after MRI ordered after seen ID on 4/23 due to progressive lower back pain. Out patient MRI completed today noted epidural abscess. Patient presents for further treatment and neurosurgery care.  Patient noted difficulty with urination however has  had this problem since hospitalization and this was noted to be related to history of BPH.  Patient notes no fever/chills / n/v/d/ diarrhea, weakness in lower extremities , bladder or bowel incontinence or saddle anesthesia. He notes paresthesia in right lower extremity but this is unchanged since last hospitalization.     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  Patient meets SIRS criteria including respiratory rate of 23, WBC of 14.7 with source of infection as indicated above -in setting  of recent MSSA bacteremia related to skin wounds  - presented with  worsening back pain  -Status post IR guided drainage of abscess in the region of L5 -s1 on 03/03/2023 -Continue antibiotics as recommended by infectious disease -Follow-up on CRP /ESR ordered -Interventional radiologist, neurosurgeon as well as infectious disease on board - monitor neuro checks  I have switched Dilaudid to morphine today   Hypokalemia-continue repletion and monitoring   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 Adriana Simas in 2019 -history of lumbar fusion in 2010, as well as cervical spine surgery. -Right lower extremity paresthesias  - persistent since prior hospitalization  PT OT on board   HTN -resumed amlodipine      Glaucoma  -resumed eye drops   Hyperlipidemia -continue on crestor    Urinary retention  BPH - foley place  - voiding trial as able  - finasteride -out patient urology consult once stable    DVT prophylaxis: SCDs   Code Status: full code   Consults: Neuro surgery Dr Marcell Barlow, Infectious disease, interventional radiology   Physical Exam: General: Appears to be in some distress on account of back pain Neck: normal, supple, no masses, no thyromegaly Respiratory: clear to auscultation bilaterally, Cardiovascular: Regular rate and rhythm, no murmurs / rubs / gallops.  Abdomen: no tenderness, no masses palpated.  Musculoskeletal: no clubbing / cyanosis. No joint deformity upper and lower extremities. Good ROM, no contractures. Normal muscle tone.  Skin: Hyper zoster rash noted to the lower back Neurologic: CN 2-12 grossly intact. Sensation intact, . Strength 5/5 in all 4 except right lower ext 4/5 with decrease ability to plantar flex. Psychiatric: Normal judgment and insight.  Data Reviewed: Have personally reviewed patient's laboratory results showing sodium 136 potassium 3.4   Family Communication: Discussed with wife present at bedside   Disposition: Status is:  Inpatient Remains inpatient appropriate because: Continues to require IV antibiotics with several consultants input including infectious disease, neurosurgeon as well as interventional radiologist   Time spent: 45 minutes    Vitals:   03/05/23 0322 03/05/23 0500 03/05/23 1013 03/05/23 1430  BP: (!) 147/78  (!) 147/84 125/88  Pulse: 66  67 85  Resp: 18  20 20   Temp: 98 F (36.7 C)  98 F (36.7 C) 98 F (36.7 C)  TempSrc: Oral     SpO2: 99%  91% 96%  Weight:  88.3 kg    Height:         Author: Loyce Dys, MD 03/05/2023 3:19 PM  For on call review [email or web address].

## 2023-03-05 NOTE — Progress Notes (Signed)
ID brief note      Component 2 d ago  Specimen Description ABSCESS Performed at [facility], [address] [place], [place] [address]  Special Requests [facility] Performed at [facility], [address] [place], [place] [address]  Gram Stain MODERATE WBC PRESENT,BOTH PMN AND MONONUCLEAR NO ORGANISMS SEEN  Culture NO GROWTH 1 DAY Performed at [facility], [address], [place], [place] [address]  Report Status PENDING     Odette Fraction, MD Infectious Disease Physician [facility] [address] [place], [place] [address] Phone: [phone]  Fax: [phone]

## 2023-03-06 DIAGNOSIS — G062 Extradural and subdural abscess, unspecified: Secondary | ICD-10-CM | POA: Diagnosis not present

## 2023-03-06 LAB — BASIC METABOLIC PANEL
Anion gap: 7 (ref 5–15)
BUN: 13 mg/dL (ref 8–23)
CO2: 29 mmol/L (ref 22–32)
Calcium: 8.9 mg/dL (ref 8.9–10.3)
Chloride: 98 mmol/L (ref 98–111)
Creatinine, Ser: 0.85 mg/dL (ref 0.61–1.24)
GFR, Estimated: >60 mL/min (ref >60)
Glucose, Bld: 110 mg/dL — ABNORMAL HIGH (ref 70–99)
Potassium: 3.7 mmol/L (ref 3.5–5.1)
Sodium: 134 mmol/L — ABNORMAL LOW (ref 135–145)

## 2023-03-06 LAB — CBC WITH DIFFERENTIAL/PLATELET
Abs Immature Granulocytes: 0.05 10*3/uL (ref 0.00–0.07)
Basophils Absolute: 0.1 10*3/uL (ref 0.0–0.1)
Basophils Relative: 1 %
Eosinophils Absolute: 0.2 10*3/uL (ref 0.0–0.5)
Eosinophils Relative: 1 %
HCT: 36.4 % — ABNORMAL LOW (ref 39.0–52.0)
Hemoglobin: 12.2 g/dL — ABNORMAL LOW (ref 13.0–17.0)
Immature Granulocytes: 0 %
Lymphocytes Relative: 30 %
Lymphs Abs: 4.4 10*3/uL — ABNORMAL HIGH (ref 0.7–4.0)
MCH: 30.9 pg (ref 26.0–34.0)
MCHC: 33.5 g/dL (ref 30.0–36.0)
MCV: 92.2 fL (ref 80.0–100.0)
Monocytes Absolute: 1.7 10*3/uL — ABNORMAL HIGH (ref 0.1–1.0)
Monocytes Relative: 12 %
Neutro Abs: 8.3 10*3/uL — ABNORMAL HIGH (ref 1.7–7.7)
Neutrophils Relative %: 56 %
Platelets: 288 10*3/uL (ref 150–400)
RBC: 3.95 MIL/uL — ABNORMAL LOW (ref 4.22–5.81)
RDW: 13 % (ref 11.5–15.5)
WBC: 14.8 10*3/uL — ABNORMAL HIGH (ref 4.0–10.5)
nRBC: 0 % (ref 0.0–0.2)

## 2023-03-06 LAB — CULTURE, BLOOD (ROUTINE X 2)

## 2023-03-06 NOTE — Progress Notes (Signed)
Neurosurgery visit note Patient seen and examined this morning.  He notes his back pain is significantly previous currently on antibiotics and he has been mobilizing as tolerated.  He continues to have his baseline right lower extremity subjective weakness as well as a Foley in place.  None of these are changes at his baseline.  Physical exam he has stable 4 out of 5 dorsiflexion of the right foot as well as EHL on the right-hand side otherwise full strength and sensation on the left and right.  AP: Overall the patient is doing well stable from a neurologic standpoint.  Appreciate the primary team's excellent care and ongoing follow-up for his culture data as well as ongoing antibiotic therapy continue to mobilize as tolerated neurosurgery will continue to follow  Brett Bender. Madaline Brilliant, MD Neurosurgery

## 2023-03-06 NOTE — Progress Notes (Signed)
Physical Therapy Treatment Patient Details Name: Brett Bender MRN: [id number] DOB: 12-10-1945 Today's Date: 03/06/2023   History of Present Illness Patient is a 77 year old male with complaints of severe progressive back pain. Known MSSA bacteremia with MRI reporting new discitis osteomyelitis. History of lumbar and cervical spine surgeries.    PT Comments    Pt stated he has walked (to ICU doors and back) this morning but ready to try again.  Bed mobility without assist.  He asked for assist to stand but with cues is able to stand with min guard.  Walks 40' with RW and min guard.  Generally unsteady but no LOB or buckling noted. He does need mod vc's to slow, keep walker closer, watch catheter tubing and IV lines and to keep RW close to him as he does try to leave RW to the side and take a few steps to bed reaching for bedrail.  Education for safety.  "Oh, I forgot".  Would benefit from +1 assist upon discharge for mobility.   Recommendations for follow up therapy are one component of a multi-disciplinary discharge planning process, led by the attending physician.  Recommendations may be updated based on patient status, additional functional criteria and insurance authorization.  Follow Up Recommendations       Assistance Recommended at Discharge    Patient can return home with the following A little help with walking and/or transfers;A little help with bathing/dressing/bathroom;Assist for transportation;Help with stairs or ramp for entrance;Assistance with cooking/housework   Equipment Recommendations       Recommendations for Other Services       Precautions / Restrictions Precautions Precautions: Fall Precaution Comments: general spine precuations for comfort Restrictions Weight Bearing Restrictions: No     Mobility  Bed Mobility Overal bed mobility: Modified Independent               Patient Response: Cooperative, Impulsive  Transfers Overall transfer level:  Needs assistance Equipment used: Rolling walker (2 wheels) Transfers: Sit to/from Stand Sit to Stand: Min guard           General transfer comment: cues for hand placements as he asked to be pulled up to stand but is able to do on his onw when pushing from bed    Ambulation/Gait Ambulation/Gait assistance: Min guard Gait Distance (Feet): 70 Feet Assistive device: Rolling walker (2 wheels) Gait Pattern/deviations: Step-through pattern, Decreased step length - right, Decreased step length - left, Decreased dorsiflexion - right, Decreased dorsiflexion - left Gait velocity: decr         Stairs             Wheelchair Mobility    Modified Rankin (Stroke Patients Only)       Balance Overall balance assessment: Needs assistance Sitting-balance support: Feet supported Sitting balance-Leahy Scale: Good     Standing balance support: Bilateral upper extremity supported, During functional activity, Reliant on assistive device for balance Standing balance-Leahy Scale: Fair Standing balance comment: improved safety with BUE support                            Cognition Arousal/Alertness: Awake/alert Behavior During Therapy: WFL for tasks assessed/performed, Impulsive Overall Cognitive Status: Within Functional Limits for tasks assessed                                 General Comments: impulsive with cues to  slow to watch catheter and IV tubing        Exercises      General Comments        Pertinent Vitals/Pain Pain Assessment Pain Assessment: No/denies pain    Home Living                          Prior Function            PT Goals (current goals can now be found in the care plan section) Progress towards PT goals: Progressing toward goals    Frequency    Min 3X/week      PT Plan Current plan remains appropriate    Co-evaluation              AM-PAC PT "6 Clicks" Mobility   Outcome Measure  Help  needed turning from your back to your side while in a flat bed without using bedrails?: None Help needed moving from lying on your back to sitting on the side of a flat bed without using bedrails?: None Help needed moving to and from a bed to a chair (including a wheelchair)?: A Little Help needed standing up from a chair using your arms (e.g., wheelchair or bedside chair)?: A Little Help needed to walk in hospital room?: A Little Help needed climbing 3-5 steps with a railing? : A Lot 6 Click Score: 19    End of Session Equipment Utilized During Treatment: Gait belt Activity Tolerance: Patient tolerated treatment well Patient left: in bed;with call bell/phone within reach;with bed alarm set Nurse Communication: Mobility status PT Visit Diagnosis: Other abnormalities of gait and mobility (R26.89);Pain;Difficulty in walking, not elsewhere classified (R26.2);Muscle weakness (generalized) (M62.81)     Time: 4098-1191 PT Time Calculation (min) (ACUTE ONLY): 8 min  Charges:  $Gait Training: 8-22 mins                   Danielle Dess, PTA 03/06/23, 3:02 PM

## 2023-03-06 NOTE — Progress Notes (Signed)
Progress Note   Patient: Brett Bender QMV:[id number] DOB: 05/14/1946 DOA: 03/02/2023     4 DOS: the patient was seen and examined on 03/06/2023    Subjective:  Patient seen and examined at bedside this morning He tells me he is doing better the back pain is improving Denies abdominal pain chest pain or cough     Brief hospital course: Brett Bender is a 77 y.o. male with medical history significant of  renal stones, BCC, BPH, djd of the spine(history of lumbar surgery L3-L4 decompression done by Dr. Adriana Simas in 2019,lumbar fusion in 2010, as well as cervical spine surgery.) , depression, hypertension with recent interim history of admission 02/07/2023 with discharge 02/13/2023 with diagnosis of sepsis due to MSSA bacteremia  from skin wounds,as well as initially presenting with accidental opiate overdose. Patient was discharged with cefazolin iv , recent f/u with ID rec stop date 03/23/2023.  Patient present to ED after MRI ordered after seen ID on 4/23 due to progressive lower back pain. Out patient MRI completed today noted epidural abscess. Patient presents for further treatment and neurosurgery care.  Patient noted difficulty with urination however has  had this problem since hospitalization and this was noted to be related to history of BPH.  Patient notes no fever/chills / n/v/d/ diarrhea, weakness in lower extremities , bladder or bowel incontinence or saddle anesthesia. He notes paresthesia in right lower extremity but this is unchanged since last hospitalization.     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  Patient meets SIRS criteria including respiratory rate of 23, WBC of 14.7 with source of infection as indicated above -in setting  of recent MSSA bacteremia related to skin wounds  - presented with worsening back pain  -Status post IR guided  drainage of abscess in the region of L5 -s1 on 03/03/2023 -Continue antibiotics as recommended by infectious disease -Interventional radiologist, neurosurgeon as well as infectious disease on board - monitor neuro checks  Continue as needed pain management Continue to follow-up on culture results to guide antibiotic therapy  Hypokalemia-continue repletion and monitoring   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 Adriana Simas in 2019 -history of lumbar fusion in 2010, as well as cervical spine surgery. -Right lower extremity paresthesias  - persistent since prior hospitalization  PT OT on board   HTN -resumed amlodipine      Glaucoma  -resumed eye drops   Hyperlipidemia -continue on crestor    Urinary retention  BPH - foley place  - voiding trial as able  - finasteride -out patient urology consult once stable    DVT prophylaxis: SCDs   Code Status: full code   Consults: Neuro surgery Dr Marcell Barlow, Infectious disease, interventional radiology   Physical Exam: General: Appears to be in some distress on account of back pain Neck: normal, supple, no masses, no thyromegaly Respiratory: clear to auscultation bilaterally, Cardiovascular: Regular rate and rhythm, no murmurs / rubs / gallops.  Abdomen: no tenderness, no masses palpated.  Musculoskeletal: no clubbing / cyanosis. No joint deformity upper and lower extremities. Good ROM, no contractures. Normal muscle tone.  Skin: Hyper zoster rash noted to the lower back Neurologic: CN 2-12 grossly intact. Sensation intact, . Strength 5/5 in all 4 except right lower ext 4/5 with decrease ability to plantar flex. Psychiatric: Normal judgment and insight.    Data Reviewed: Have  personally reviewed patient's laboratory results showing  Sodium 134 potassium 3.7 creatinine 0.8, leukocytosis slightly worsened today 14.8  Family Communication: Discussed with wife present at  bedside   Disposition: Status is: Inpatient Remains inpatient appropriate because: Continues to require IV antibiotics with several consultants input including infectious disease, neurosurgeon as well as interventional radiologist   Time spent: 40 minutes        Vitals:   03/06/23 0412 03/06/23 0839 03/06/23 0842 03/06/23 1426  BP: 139/84 (!) 158/80  127/84  Pulse: 73 68  64  Resp: 17     Temp: 97.8 F (36.6 C)  98.6 F (37 C) 98.2 F (36.8 C)  TempSrc:   Oral Oral  SpO2: 96% 96%  97%  Weight:      Height:        Author: Loyce Dys, MD 03/06/2023 5:31 PM  For on call review [email or web address].

## 2023-03-07 DIAGNOSIS — G062 Extradural and subdural abscess, unspecified: Secondary | ICD-10-CM | POA: Diagnosis not present

## 2023-03-07 LAB — CBC WITH DIFFERENTIAL/PLATELET
Abs Immature Granulocytes: 0.05 10*3/uL (ref 0.00–0.07)
Basophils Absolute: 0.1 10*3/uL (ref 0.0–0.1)
Basophils Relative: 1 %
Eosinophils Absolute: 0.3 10*3/uL (ref 0.0–0.5)
Eosinophils Relative: 3 %
HCT: 37 % — ABNORMAL LOW (ref 39.0–52.0)
Hemoglobin: 12.4 g/dL — ABNORMAL LOW (ref 13.0–17.0)
Immature Granulocytes: 0 %
Lymphocytes Relative: 27 %
Lymphs Abs: 3.3 10*3/uL (ref 0.7–4.0)
MCH: 30.5 pg (ref 26.0–34.0)
MCHC: 33.5 g/dL (ref 30.0–36.0)
MCV: 91.1 fL (ref 80.0–100.0)
Monocytes Absolute: 1.3 10*3/uL — ABNORMAL HIGH (ref 0.1–1.0)
Monocytes Relative: 11 %
Neutro Abs: 7.1 10*3/uL (ref 1.7–7.7)
Neutrophils Relative %: 58 %
Platelets: 274 10*3/uL (ref 150–400)
RBC: 4.06 MIL/uL — ABNORMAL LOW (ref 4.22–5.81)
RDW: 13.1 % (ref 11.5–15.5)
WBC: 12.2 10*3/uL — ABNORMAL HIGH (ref 4.0–10.5)
nRBC: 0 % (ref 0.0–0.2)

## 2023-03-07 LAB — BASIC METABOLIC PANEL
Anion gap: 8 (ref 5–15)
BUN: 13 mg/dL (ref 8–23)
CO2: 28 mmol/L (ref 22–32)
Calcium: 8.8 mg/dL — ABNORMAL LOW (ref 8.9–10.3)
Chloride: 97 mmol/L — ABNORMAL LOW (ref 98–111)
Creatinine, Ser: 0.81 mg/dL (ref 0.61–1.24)
GFR, Estimated: >60 mL/min (ref >60)
Glucose, Bld: 102 mg/dL — ABNORMAL HIGH (ref 70–99)
Potassium: 3.9 mmol/L (ref 3.5–5.1)
Sodium: 133 mmol/L — ABNORMAL LOW (ref 135–145)

## 2023-03-07 LAB — CULTURE, BLOOD (ROUTINE X 2)
Culture: NO GROWTH
Culture: NO GROWTH

## 2023-03-07 MED ORDER — MENTHOL 3 MG MT LOZG
1.0000 | LOZENGE | OROMUCOSAL | Status: DC | PRN
  Administered 2023-03-07 – 2023-03-08 (×4): 3 mg via ORAL
  Filled 2023-03-07: qty 9

## 2023-03-07 MED ORDER — CEFAZOLIN IV (FOR PTA / DISCHARGE USE ONLY)
6.0000 g | INTRAVENOUS | 0 refills | Status: AC
Start: 2023-03-08 — End: 2023-04-13

## 2023-03-07 NOTE — Care Management Important Message (Signed)
Important Message  Patient Details  Name: Brett Bender MRN: [id number] Date of Birth: October 01, 1946   Medicare Important Message Given:  Yes     Johnell Comings 03/07/2023, 12:14 PM

## 2023-03-07 NOTE — TOC Progression Note (Signed)
Transition of Care [facility]) - Progression Note    Patient Details  Name: Brett Bender MRN: [id number] Date of Birth: 1946/10/14  Transition of [facility]) CM/SW Contact  Truddie Hidden, RN Phone Number: 03/07/2023, 2:13 PM  Clinical Narrative:    Per notification received from MD. Patient is likely to discharge home tomorrow. He is active with [facility] PT/ RN.  [name] from [employer] notified.  Barbara Cower from [facility] notified.          Expected Discharge Plan and Services                                               Social Determinants of Health (SDOH) Interventions SDOH Screenings   Food Insecurity: No Food Insecurity (03/03/2023)  Housing: Low Risk  (03/03/2023)  Transportation Needs: No Transportation Needs (03/03/2023)  Utilities: Not At Risk (03/03/2023)  Alcohol Screen: Low Risk  (01/04/2023)  Depression (PHQ2-9): High Risk (02/23/2023)  Financial Resource Strain: Low Risk  (01/04/2023)  Physical Activity: Sufficiently Active (01/04/2023)  Social Connections: Moderately Integrated (01/04/2023)  Stress: No Stress Concern Present (01/04/2023)  Tobacco Use: Low Risk  (03/03/2023)    Readmission Risk Interventions     No data to display

## 2023-03-07 NOTE — Progress Notes (Signed)
Progress Note   Patient: Brett Bender:[id number] DOB: 09-27-1946 DOA: 03/02/2023     5 DOS: the patient was seen and examined on 03/07/2023      Subjective:  Patient seen and examined at bedside this morning Continues to work with physical therapist We will wean pain medication as tolerated I have asked for final outpatient antibiotic recommendations from infectious disease PICC line in place for patient to complete outpatient intravenous antibiotic for his underlying osteomyelitis. I have informed case managers and getting outpatient antibiotic arrangement for possible discharge tomorrow Denies abdominal pain chest pain or cough     Brief hospital course: Brett Bender is a 77 y.o. male with medical history significant of  renal stones, BCC, BPH, djd of the spine(history of lumbar surgery L3-L4 decompression done by Dr. Adriana Bender in 2019,lumbar fusion in 2010, as well as cervical spine surgery.) , depression, hypertension with recent interim history of admission 02/07/2023 with discharge 02/13/2023 with diagnosis of sepsis due to MSSA bacteremia  from skin wounds,as well as initially presenting with accidental opiate overdose. Patient was discharged with cefazolin iv , recent f/u with ID rec stop date 03/23/2023.  Patient present to ED after MRI ordered after seen ID on 4/23 due to progressive lower back pain. Out patient MRI completed today noted epidural abscess. Patient presents for further treatment and neurosurgery care.  Patient noted difficulty with urination however has  had this problem since hospitalization and this was noted to be related to history of BPH.  Patient notes no fever/chills / n/v/d/ diarrhea, weakness in lower extremities , bladder or bowel incontinence or saddle anesthesia. He notes paresthesia in right lower extremity but this is unchanged since last hospitalization.     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  Patient meets SIRS criteria including respiratory rate of 23, WBC of 14.7 with source of infection as indicated above -in setting  of recent MSSA bacteremia related to skin wounds  - presented with worsening back pain  -Status post IR guided drainage of abscess in the region of L5 -s1 on 03/03/2023 -Continue antibiotics as recommended by infectious disease -Interventional radiologist, neurosurgeon as well as infectious disease on board - monitor neuro checks  Continue as needed pain management Continue to follow-up on culture results to guide antibiotic therapy We will wean pain medication as tolerated I have asked for final outpatient antibiotic recommendations from infectious disease PICC line in place for patient to complete outpatient intravenous antibiotic for his underlying osteomyelitis. 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 Adriana Bender in 2019 -history of lumbar fusion in 2010, as well as cervical spine surgery. -Right lower extremity paresthesias is improved - persistent since prior hospitalization  PT OT on board    HTN -resumed amlodipine      Glaucoma  -resumed eye drops   Hyperlipidemia -continue on crestor    Urinary retention  BPH - foley place  - voiding trial as able  - finasteride -out patient urology consult once stable    DVT prophylaxis: SCDs   Code Status: full code   Consults: Neuro surgery Dr Marcell Barlow, Infectious disease, interventional radiology   Physical Exam: General: Appears to be in some distress  on account of back pain Neck: normal, supple, no masses, no thyromegaly Respiratory: clear to auscultation  bilaterally, Cardiovascular: Regular rate and rhythm, no murmurs / rubs / gallops.  Abdomen: no tenderness, no masses palpated.  Musculoskeletal: no clubbing / cyanosis. No joint deformity upper and lower extremities. Good ROM, no contractures. Normal muscle tone.  Skin: Hyper zoster rash noted to the lower back Neurologic: CN 2-12 grossly intact. Sensation intact, . Strength 5/5 in all 4 except right lower ext 4/5 plantarflexion improved Psychiatric: Normal judgment and insight.    Data Reviewed: Have personally reviewed patient's laboratory results showing  Sodium 134 potassium 3.7 creatinine 0.8, leukocytosis slightly worsened today 14.8   Family Communication: Discussed with wife present at bedside   Disposition: Status is: Inpatient Remains inpatient appropriate because: Continues to require IV antibiotics with several consultants input including infectious disease, neurosurgeon as well as interventional radiologist   Time spent: 43 minutes       Vitals:   03/06/23 2333 03/07/23 0331 03/07/23 0829 03/07/23 0917  BP: (!) 151/80 127/77 (!) 140/84 (!) 141/81  Pulse: 69 72 66 65  Resp: 18 18  18   Temp: 98.4 F (36.9 C) 98.6 F (37 C) 98.6 F (37 C) 98.1 F (36.7 C)  TempSrc: Oral Oral Oral   SpO2: 95% 91% 94% 96%  Weight:      Height:        Author: Loyce Dys, MD 03/07/2023 3:04 PM  For on call review [email or web address].

## 2023-03-07 NOTE — Consult Note (Addendum)
[facility]  Date of Admission:  03/02/2023   Total days of inpatient antibiotics 4  Principal Problem:   Epidural abscess Active Problems:   Discitis of lumbar region          Assessment: 77 year old male with recent hospitalization for MSSA bacteremia on cefazolin via PICC line through 5/15 presented with back pain and admitted for vertebral or infection as below. #Acute osteomyelitis L5-S1 with associated epidural abscess with left psoas abscess #Status post L5-S1 disc aspiration, cultures no growth #Back pain - MR lumbar spine on previous admission did not show infection - MRI lumbar spine 4/24 showed acute osteomyelitis/discitis L5-S1 with associated vertebral epidural abscess extending L4-5 through S1-2 with associated moderate spinal stenosis.,  Associated intradural components - Paraspinous inflammation with left psoas abscesses - Patient underwent L5-S1 aspirated with IR, noted 3 cc of frank plus, cultures NG   #Recent history of MSSA bacteremia on 4/1 #Right arm PICC - Presumed secondary to skin source as wound cultures on back grew MSSA, blood cultures cleared on 4/3-TTE on 4/3 showed no vegetations - TEE not done due to risk, plan to continue cefazolin through 5/15 -MR lumbar spine on 4/2 showed no evidence of discitis/osteomyelitis or epidural fluid collection.  Recommendations: -Continue cefazolin, plan on 6 weeks from aspiration on 4/25 EOT 5/6.  Then can continue with cefadroxil 1 g twice daily to complete 6 weeks total of antibiotics - I counseled patient in detail about back pain will take a while to return to baseline in the setting of vertebral infection. -Follow-up with Dr. Rivka Safer - ID will sign off     OPAT ORDERS:  Diagnosis: MSSA vertebral osteo/ epidural abscess  Culture Result: 4/25 epidural aspirate NG  Allergies  Allergen Reactions   Penicillins Other (See Comments)    Stomach Ache     Discharge  antibiotics to be given via PICC line:  Per pharmacy protocol Cefazolin 6 grams IV infusion every 24 hours     Duration: 6 weeks End Date: 04/13/23  [facility] Care Per Protocol with [employer] Use: Home health RN for IV administration and teaching, line care and labs.    Labs weekly while on IV antibiotics: _x_ CBC with differential __ BMP **TWICE WEEKLY ON VANCOMYCIN  __x CMP x__ CRP _x_ ESR __ Vancomycin trough TWICE WEEKLY __ CK  __ Please pull PIC at completion of IV antibiotics _x_ Please leave PIC in place until doctor has seen patient or been notified  Fax weekly labs to [phone]  Clinic Follow Up Appt: 5/14  @ [facility] with Dr/ Rivka Safer Microbiology:   Antibiotics: Cefazolin Cultures: Blood  Urine  Other   SUBJECTIVE: Afebrile overnight, being in chair.  Wife at bedside.  Asking questions about back pain. Afebrile overnight Review of Systems: Review of Systems  All other systems reviewed and are negative.    Scheduled Meds:  acidophilus  1 capsule Oral Daily   amLODipine  10 mg Oral Daily   Chlorhexidine Gluconate Cloth  6 each Topical Daily   docusate sodium  100 mg Oral BID   enoxaparin (LOVENOX) injection  40 mg Subcutaneous QHS   finasteride  5 mg Oral Daily   latanoprost  1 drop Both Eyes QHS   mirtazapine  30 mg Oral QHS   multivitamin with minerals  1 tablet Oral Daily   psyllium  1 packet Oral Daily   rosuvastatin  20 mg Oral Daily   tamsulosin  0.4 mg Oral Daily   timolol  1 drop Both Eyes Daily   traMADol  50 mg Oral Q8H   Continuous Infusions:   ceFAZolin (ANCEF) IV 2 g (03/07/23 1322)   PRN Meds:.acetaminophen **OR** acetaminophen, albuterol, diclofenac Sodium, gabapentin, morphine injection, ondansetron **OR** ondansetron (ZOFRAN) IV, oxyCODONE-acetaminophen Allergies  Allergen Reactions   Penicillins Other (See Comments)    Stomach Ache    OBJECTIVE: Vitals:   03/06/23 2333 03/07/23 0331 03/07/23 0829 03/07/23 0917  BP:  (!) 151/80 127/77 (!) 140/84 (!) 141/81  Pulse: 69 72 66 65  Resp: 18 18  18   Temp: 98.4 F (36.9 C) 98.6 F (37 C) 98.6 F (37 C) 98.1 F (36.7 C)  TempSrc: Oral Oral Oral   SpO2: 95% 91% 94% 96%  Weight:      Height:       Body mass index is 24.33 kg/m.  Physical Exam Constitutional:      General: He is not in acute distress.    Appearance: He is normal weight. He is not toxic-appearing.  HENT:     Head: Normocephalic and atraumatic.     Right Ear: External ear normal.     Left Ear: External ear normal.     Nose: No congestion or rhinorrhea.     Mouth/Throat:     Mouth: Mucous membranes are moist.     Pharynx: Oropharynx is clear.  Eyes:     Extraocular Movements: Extraocular movements intact.     Conjunctiva/sclera: Conjunctivae normal.     Pupils: Pupils are equal, round, and reactive to light.  Cardiovascular:     Rate and Rhythm: Normal rate and regular rhythm.     Heart sounds: No murmur heard.    No friction rub. No gallop.  Pulmonary:     Effort: Pulmonary effort is normal.     Breath sounds: Normal breath sounds.  Abdominal:     General: Abdomen is flat. Bowel sounds are normal.     Palpations: Abdomen is soft.  Musculoskeletal:        General: No swelling. Normal range of motion.     Cervical back: Normal range of motion and neck supple.  Skin:    General: Skin is warm and dry.  Neurological:     General: No focal deficit present.     Mental Status: He is oriented to person, place, and time.  Psychiatric:        Mood and Affect: Mood normal.       Lab Results Lab Results  Component Value Date   WBC 12.2 (H) 03/07/2023   HGB 12.4 (L) 03/07/2023   HCT 37.0 (L) 03/07/2023   MCV 91.1 03/07/2023   PLT 274 03/07/2023    Lab Results  Component Value Date   CREATININE 0.81 03/07/2023   BUN 13 03/07/2023   NA 133 (L) 03/07/2023   K 3.9 03/07/2023   CL 97 (L) 03/07/2023   CO2 28 03/07/2023    Lab Results  Component Value Date   ALT 6  03/03/2023   AST 15 03/03/2023   ALKPHOS 72 03/03/2023   BILITOT 0.6 03/03/2023        Danelle Earthly, MD [facility] 03/07/2023, 1:57 PM

## 2023-03-07 NOTE — Progress Notes (Signed)
PHARMACY CONSULT NOTE FOR:  OUTPATIENT  PARENTERAL ANTIBIOTIC THERAPY (OPAT)  Indication: MSSA bacteremia - now with epidural abscess and discitis Regimen: Cefazolin 6gm IV  as continuous infusion over 24hr End date: 04/13/2023  Labs - Once weekly:  CBC/D, CMP, ESR and CRP Please leave PIC in place until doctor has seen patient or been notified Fax weekly labs to [phone]  IV antibiotic discharge orders are pended. To discharging provider:  please sign these orders via discharge navigator,  Select New Orders & click on the button choice - Manage This Unsigned Work.     Thank you for allowing pharmacy to be a part of this patient's care.  Juliette Alcide, PharmD, BCPS, BCIDP Work Cell: [phone] 03/07/2023 2:57 PM

## 2023-03-07 NOTE — Consult Note (Signed)
   [facility] Inpatient Consult   03/07/2023  Brett Bender 1946/02/06 [id number]  Primary Care Provider:  Dr. Sherrie Mustache [facility]).  Patient is currently active with [facility] Care Management for chronic disease management services.  Patient has been engaged by a [facility] RN.  Our community based plan of care has focused on disease management and community resource support.   Patient will receive a post hospital call and will be evaluated for assessments and disease process education.   Plan: Pt to be discharged with [facility] on IV antibiotic.   Inpatient Transition Of Care [TOC] team member has collaborated on this patient with noted updates and aware that [facility] Care Management following.  Of note, [facility] Care Management services does not replace or interfere with any services that are needed or arranged by inpatient [facility] care management team.   For additional questions or referrals please contact:  Elliot Cousin, RN, BSN [facility] Liaison [place]   [facility]  Population Health Office Hours MTWF 8:00 am to 6 pm off on Thursday [phone] mobile [phone] [Office toll free line][facility] Office Hours are M-F 8:30 - 5 pm 24 hour nurse advise line [phone] Conceirge  [email or web address] [name]

## 2023-03-08 ENCOUNTER — Ambulatory Visit (INDEPENDENT_AMBULATORY_CARE_PROVIDER_SITE_OTHER): Payer: Medicare Other | Admitting: Family Medicine

## 2023-03-08 DIAGNOSIS — M5417 Radiculopathy, lumbosacral region: Secondary | ICD-10-CM

## 2023-03-08 DIAGNOSIS — Z792 Long term (current) use of antibiotics: Secondary | ICD-10-CM | POA: Diagnosis not present

## 2023-03-08 DIAGNOSIS — A419 Sepsis, unspecified organism: Secondary | ICD-10-CM

## 2023-03-08 DIAGNOSIS — B9561 Methicillin susceptible Staphylococcus aureus infection as the cause of diseases classified elsewhere: Secondary | ICD-10-CM

## 2023-03-08 DIAGNOSIS — G062 Extradural and subdural abscess, unspecified: Secondary | ICD-10-CM | POA: Diagnosis not present

## 2023-03-08 DIAGNOSIS — R7881 Bacteremia: Secondary | ICD-10-CM

## 2023-03-08 DIAGNOSIS — R6520 Severe sepsis without septic shock: Secondary | ICD-10-CM | POA: Diagnosis not present

## 2023-03-08 LAB — CBC WITH DIFFERENTIAL/PLATELET
Abs Immature Granulocytes: 0.04 10*3/uL (ref 0.00–0.07)
Basophils Absolute: 0.1 10*3/uL (ref 0.0–0.1)
Basophils Relative: 1 %
Eosinophils Absolute: 0.4 10*3/uL (ref 0.0–0.5)
Eosinophils Relative: 4 %
HCT: 36.3 % — ABNORMAL LOW (ref 39.0–52.0)
Hemoglobin: 12.1 g/dL — ABNORMAL LOW (ref 13.0–17.0)
Immature Granulocytes: 0 %
Lymphocytes Relative: 29 %
Lymphs Abs: 3.5 10*3/uL (ref 0.7–4.0)
MCH: 30.6 pg (ref 26.0–34.0)
MCHC: 33.3 g/dL (ref 30.0–36.0)
MCV: 91.9 fL (ref 80.0–100.0)
Monocytes Absolute: 1.1 10*3/uL — ABNORMAL HIGH (ref 0.1–1.0)
Monocytes Relative: 9 %
Neutro Abs: 7.1 10*3/uL (ref 1.7–7.7)
Neutrophils Relative %: 57 %
Platelets: 284 10*3/uL (ref 150–400)
RBC: 3.95 MIL/uL — ABNORMAL LOW (ref 4.22–5.81)
RDW: 13 % (ref 11.5–15.5)
WBC: 12.3 10*3/uL — ABNORMAL HIGH (ref 4.0–10.5)
nRBC: 0 % (ref 0.0–0.2)

## 2023-03-08 LAB — BASIC METABOLIC PANEL
Anion gap: 7 (ref 5–15)
BUN: 16 mg/dL (ref 8–23)
CO2: 28 mmol/L (ref 22–32)
Calcium: 8.6 mg/dL — ABNORMAL LOW (ref 8.9–10.3)
Chloride: 100 mmol/L (ref 98–111)
Creatinine, Ser: 0.79 mg/dL (ref 0.61–1.24)
GFR, Estimated: >60 mL/min (ref >60)
Glucose, Bld: 108 mg/dL — ABNORMAL HIGH (ref 70–99)
Potassium: 3.7 mmol/L (ref 3.5–5.1)
Sodium: 135 mmol/L (ref 135–145)

## 2023-03-08 LAB — AEROBIC/ANAEROBIC CULTURE W GRAM STAIN (SURGICAL/DEEP WOUND)

## 2023-03-08 MED ORDER — MENTHOL 3 MG MT LOZG: 1.0000 | LOZENGE | OROMUCOSAL | 0 refills | Status: DC | PRN

## 2023-03-08 NOTE — Discharge Summary (Signed)
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 male with medical history significant of  renal stones, BCC, BPH, djd of the spine(history of lumbar surgery L3-L4 decompression done by Dr. Adriana Simas in 2019,lumbar fusion in 2010, as well as cervical spine surgery.) , depression, hypertension with recent interim history of admission 02/07/2023 with discharge 02/13/2023 with diagnosis of sepsis due to MSSA bacteremia  from skin wounds,as well as initially presenting with accidental opiate overdose. Patient was discharged with cefazolin iv , recent f/u with ID rec stop date 03/23/2023.  Patient present to ED after MRI ordered after seen ID on 4/23 due to progressive lower back pain. Out patient MRI completed on the day of this admission noted epidural abscess. Patient presents for further treatment and neurosurgery care.  Patient was seen by neurosurgeon on presentation and IR was consulted and patient underwent epidural abscess drainage.  Patient also was placed on antibiotics which were later de-escalated by infectious disease to cefazolin.  Patient has been seen by PT OT has undergone Foley catheter removal with voiding trial with no issues.  Patient is therefore being discharged to complete 6 weeks of antibiotics for his underlying osteomyelitis/discitis of the lumbar spine and to follow-up with infectious disease as an outpatient as well as neurosurgeon.      Consultants: Infectious disease, neurosurgeon, interventional radiologist Procedures performed: As mentioned above Disposition: Home health Diet recommendation:  Discharge Diet Orders (From admission, onward)     Start     Ordered   03/08/23 0000  Diet - low sodium heart healthy        03/08/23 1406           Cardiac diet DISCHARGE MEDICATION: Allergies as of 03/08/2023       Reactions   Penicillins Other (See Comments)   Stomach Ache        Medication List     STOP taking these medications    mupirocin ointment 2 % Commonly known as: BACTROBAN       TAKE these medications    amLODipine 10 MG tablet Commonly known as: NORVASC TAKE 1 TABLET BY MOUTH ONCE A DAY   aspirin 81 MG chewable tablet Chew by mouth daily.   ceFAZolin  IVPB Commonly known as: ANCEF Inject 6 g into the vein continuous. Cefazolin 6gm IV  as continuous infusion over 24hr Indication:  MSSA bacteremia with epidural abscess and discitis First Dose: Yes Last Day of Therapy:  04/13/2023 Labs - Once weekly:  CBC/D, CMP, ESR and CRP Please leave PIC in place until doctor has seen patient or been notified Fax weekly labs to [phone] Method of administration: elastomeric Method of administration may be changed at the discretion of home infusion pharmacist based upon assessment of the patient and/or caregiver's ability to self-administer the medication ordered. What changed: additional instructions   diclofenac Sodium 1 % Gel Commonly known as: VOLTAREN APPLY 4G TOPICALLY FIVE TIMES DAILY   DIGESTIVE ADVANTAGE PO Take 1 tablet by mouth daily.  fexofenadine 180 MG tablet Commonly known as: Allegra Allergy Take 1 tablet (180 mg total) by mouth daily.   finasteride 5 MG tablet Commonly known as: PROSCAR Take 1 tablet (5 mg total) by mouth daily.   gabapentin 300 MG capsule Commonly known as: NEURONTIN Take 1 capsule (300 mg total) by mouth at bedtime as needed.   latanoprost  0.005 % ophthalmic solution Commonly known as: XALATAN Place 1 drop into both eyes at bedtime.   mirtazapine 30 MG tablet Commonly known as: REMERON TAKE 1 TABLET BY MOUTH EVERY NIGHT AT BEDTIME   mometasone 0.1 % cream Commonly known as: ELOCON Apply 1 application. topically daily as needed (Rash). Qd up to 5 days a week to itchy bumps on body   multivitamin tablet Take 1 tablet by mouth daily.   psyllium 58.6 % powder Commonly known as: METAMUCIL Take 1 packet by mouth daily as needed (regularity).   rosuvastatin 20 MG tablet Commonly known as: Crestor Take 1 tablet (20 mg total) by mouth daily.   tamsulosin 0.4 MG Caps capsule Commonly known as: FLOMAX Take 1 capsule (0.4 mg total) by mouth daily.   timolol 0.5 % ophthalmic solution Commonly known as: TIMOPTIC Place 1 drop into both eyes daily.   tiZANidine 4 MG tablet Commonly known as: ZANAFLEX TAKE ONE TABLET (4 MG TOTAL) BY MOUTH EVERY EIGHT (EIGHT) HOURS AS NEEDED FOR MUSCLE SPASMS.   traMADol 50 MG tablet Commonly known as: ULTRAM Take 1 tablet (50 mg total) by mouth every 8 (eight) hours. Take 1 tablet in the morning and then another 8 hours later. Do not take with gabapentin or tizanidine               Discharge Care Instructions  (From admission, onward)           Start     Ordered   03/07/23 0000  Change dressing on IV access line weekly and PRN  (Home infusion instructions - [facility] )        03/07/23 1638            Follow-up Information     Lynn Ito, MD. Schedule an appointment as soon as possible for a visit.   Specialty: Infectious Diseases Contact information: [address] [place] [address] [phone]         Venetia Night, MD. Schedule an appointment as soon as possible for a visit.   Specialty: Neurosurgery Contact information: [address] [place] [address] [phone]                 Discharge Exam: Ceasar Mons Weights   03/04/23 0500 03/05/23 0500 03/08/23 0843  Weight: 89.3 kg 88.3 kg 85.5 kg   General: In no distress Neck: normal, supple, no masses, no thyromegaly Respiratory: clear to auscultation bilaterally, Cardiovascular: Regular rate and rhythm, no murmurs / rubs / gallops.  Abdomen: no tenderness, no masses palpated.  Musculoskeletal: no clubbing / cyanosis. No joint deformity upper and lower extremities. Good ROM, no contractures. Normal muscle tone.  Skin: Crusted zoster rash noted to the lower back with no active blisters  neurologic: CN 2-12 grossly intact. Sensation intact, .  Muscle strength 5/5 in all extremities Psychiatric: Normal judgment and insight.   Condition at discharge: good   Discharge time spent: greater than 30 minutes.  Signed: Loyce Dys, MD [facility] 03/08/2023

## 2023-03-08 NOTE — Progress Notes (Signed)
Reviewed and agree with patient's detailed plan of care. Home Health Certification and Plan of Care from 02/13/2023 through 04/13/2023 signed and sent to medical records to be faxed to home health agency.

## 2023-03-08 NOTE — Patient Instructions (Signed)
.   Please review the attached list of medications and notify my office if there are any errors.   . Please bring all of your medications to every appointment so we can make sure that our medication list is the same as yours.   

## 2023-03-08 NOTE — Progress Notes (Deleted)
Brett Bender,acting as a scribe for Brett Merry, MD.,have documented all relevant documentation on the behalf of Brett Merry, MD,as directed by  Brett Merry, MD while in the presence of Brett Merry, MD.    Complete physical exam   Patient: Brett Bender   DOB: June 07, 1946   77 y.o. Male  MRN: [id number] Visit Date: 03/09/2023  Today's healthcare provider: [name], MD   No chief complaint on file.  Subjective    Brett Bender is a 77 y.o. male who presents today for a complete physical exam.  He reports consuming a {diet types:17450} diet. {Exercise:19826} He generally feels {well/fairly well/poorly:18703}. He reports sleeping {well/fairly well/poorly:18703}. He {does/does not:200015} have additional problems to discuss today.  HPI  ***  Past Medical History:  Diagnosis Date   Actinic keratosis    Arthritis    Basal cell carcinoma 11/09/2013   R lateral edge of clavicle   Basal cell carcinoma 02/03/2016   L lateral supraclavicular base of neck/excision   Basal cell carcinoma 11/29/2018   L upper back   Basal cell carcinoma 09/11/2020   R mid back    Cancer (HCC)    Basal cell carcinoma bilateral arms and left shoulder   Complication of anesthesia    slow to wake up with shoulder surgery   Depression    Enlarged prostate    H/O elbow surgery    left   Herpes simplex    History of kidney stones    History of MRSA infection 2004   skin around eye   Hypertension    Kidney stone    Lumbar stenosis    Migraine headache    Motion sickness    ocean boats   PONV (postoperative nausea and vomiting)    Tendonitis of ankle, left    Past Surgical History:  Procedure Laterality Date   APPENDECTOMY     back injection     BACK SURGERY     CYSTOSCOPY/URETEROSCOPY/HOLMIUM LASER/STENT PLACEMENT Left 12/18/2015   Procedure: CYSTOSCOPY/URETEROSCOPY/HOLMIUM LASER LITHOTRIPSY;  Surgeon: Vanna Scotland, MD;  Location: [facility];  Service: Urology;  Laterality:  Left;   ELBOW SURGERY     IR LUMBAR DISC ASPIRATION W/IMG GUIDE  03/03/2023   LOWER BACK SURGERY  2010   L4-5 fusion   LUMBAR LAMINECTOMY/DECOMPRESSION MICRODISCECTOMY N/A 08/21/2018   Procedure: LUMBAR LAMINECTOMY/DECOMPRESSION MICRODISCECTOMY 1 LEVEL-L3/4;  Surgeon: Lucy Chris, MD;  Location: [facility];  Service: Neurosurgery;  Laterality: N/A;   NECK SURGERY     Upper neck surgery; with wires placed   SHOULDER SURGERY Right 11/10/2011   arthroscopic surgery . Dr. Katrinka Blazing, [facility]   TEE WITHOUT CARDIOVERSION N/A 02/11/2023   Procedure: TRANSESOPHAGEAL ECHOCARDIOGRAM;  Surgeon: Debbe Odea, MD;  Location: [facility];  Service: Cardiovascular;  Laterality: N/A;   TONSILLECTOMY     TRICEPS TENDON REPAIR Left 12/04/2019   Procedure: TRICEPS TENDON REPAIR, OLECRANON BURSA;  Surgeon: Signa Kell, MD;  Location: [facility];  Service: Orthopedics;  Laterality: Left;   Social History   Socioeconomic History   Marital status: Married    Spouse name: Not on file   Number of children: 2   Years of education: Not on file   Highest education level: Associate degree: occupational, Scientist, product/process development, or vocational program  Occupational History   Occupation: Retired    Comment: former Programmer, systems  Tobacco Use   Smoking status: Never   Smokeless tobacco: Never  Vaping Use   Vaping Use: Never used  Substance  and Sexual Activity   Alcohol use: No    Alcohol/week: 0.0 standard drinks of alcohol   Drug use: No   Sexual activity: Yes  Other Topics Concern   Not on file  Social History Narrative   Not on file   Social Determinants of Health   Financial Resource Strain: Low Risk  (01/04/2023)   Overall Financial Resource Strain (CARDIA)    Difficulty of Paying Living Expenses: Not hard at all  Food Insecurity: No Food Insecurity (03/03/2023)   Hunger Vital Sign    Worried About Running Out of Food in the Last Year: Never true    Ran Out of Food in the Last Year: Never true   Transportation Needs: No Transportation Needs (03/03/2023)   PRAPARE - Administrator, Civil Service (Medical): No    Lack of Transportation (Non-Medical): No  Physical Activity: Sufficiently Active (01/04/2023)   Exercise Vital Sign    Days of Exercise per Week: 5 days    Minutes of Exercise per Session: 90 min  Stress: No Stress Concern Present (01/04/2023)   [employer] of Occupational Health - Occupational Stress Questionnaire    Feeling of Stress : Not at all  Social Connections: Moderately Integrated (01/04/2023)   Social Connection and Isolation Panel [NHANES]    Frequency of Communication with Friends and Family: More than three times a week    Frequency of Social Gatherings with Friends and Family: More than three times a week    Attends Religious Services: More than 4 times per year    Active Member of [employer] or Organizations: No    Attends Banker Meetings: Never    Marital Status: Married  Catering manager Violence: Not At Risk (03/03/2023)   Humiliation, Afraid, Rape, and Kick questionnaire    Fear of Current or Ex-Partner: No    Emotionally Abused: No    Physically Abused: No    Sexually Abused: No   Family Status  Relation Name Status   Mother  Deceased at age 43       cirrhosis of liver complications   Father  Deceased at age 65       SUICIDE   Daughter  Alive   Son  Alive   Neg Hx  (Not Specified)   Family History  Problem Relation Age of Onset   Depression Son    Diabetes Neg Hx    Cancer Neg Hx    Heart attack Neg Hx    Kidney disease Neg Hx    Prostate cancer Neg Hx    Kidney cancer Neg Hx    Bladder Cancer Neg Hx    Allergies  Allergen Reactions   Penicillins Other (See Comments)    Stomach Ache    Patient Care Team: Malva Limes, MD as PCP - General (Family Medicine) Harle Battiest, PA-C as Physician Assistant (Urology) Marlene Bast as Consulting Physician (Optometry) Deirdre Evener, MD  (Dermatology)   Medications: Facility-Administered Medications Prior to Visit  Medication Dose Route Frequency Provider   acetaminophen (TYLENOL) tablet 650 mg  650 mg Oral Q6H PRN Lurline Del, MD   Or   acetaminophen (TYLENOL) suppository 650 mg  650 mg Rectal Q6H PRN Lurline Del, MD   acidophilus (RISAQUAD) capsule 1 capsule  1 capsule Oral Daily Skip Mayer A, MD   albuterol (PROVENTIL) (2.5 MG/3ML) 0.083% nebulizer solution 2.5 mg  2.5 mg Nebulization Q2H PRN Lurline Del, MD   amLODipine (NORVASC)  tablet 10 mg  10 mg Oral Daily Lurline Del, MD   ceFAZolin (ANCEF) IVPB 2g/100 mL premix  2 g Intravenous Q8H Odette Fraction, MD   Chlorhexidine Gluconate Cloth 2 % PADS 6 each  6 each Topical Daily Lurline Del, MD   diclofenac Sodium (VOLTAREN) 1 % topical gel 4 g  4 g Topical QID PRN Manuela Schwartz, NP   docusate sodium (COLACE) capsule 100 mg  100 mg Oral BID Loyce Dys, MD   enoxaparin (LOVENOX) injection 40 mg  40 mg Subcutaneous QHS Rosezetta Schlatter T, MD   finasteride (PROSCAR) tablet 5 mg  5 mg Oral Daily Lurline Del, MD   gabapentin (NEURONTIN) capsule 300 mg  300 mg Oral QHS PRN Lurline Del, MD   latanoprost (XALATAN) 0.005 % ophthalmic solution 1 drop  1 drop Both Eyes QHS Lurline Del, MD   menthol-cetylpyridinium (CEPACOL) lozenge 3 mg  1 lozenge Oral PRN Loyce Dys, MD   mirtazapine (REMERON) tablet 30 mg  30 mg Oral QHS Manuela Schwartz, NP   morphine (PF) 2 MG/ML injection 1 mg  1 mg Intravenous Q4H PRN Loyce Dys, MD   multivitamin with minerals tablet 1 tablet  1 tablet Oral Daily Lurline Del, MD   ondansetron [facility]) tablet 4 mg  4 mg Oral Q6H PRN Lurline Del, MD   Or   ondansetron [facility]) injection 4 mg  4 mg Intravenous Q6H PRN Lurline Del, MD   oxyCODONE-acetaminophen (PERCOCET/ROXICET) 5-325 MG per tablet 1 tablet  1 tablet Oral Q6H PRN Lurline Del, MD    psyllium (HYDROCIL/METAMUCIL) 1 packet  1 packet Oral Daily Lurline Del, MD   rosuvastatin (CRESTOR) tablet 20 mg  20 mg Oral Daily Skip Mayer A, MD   tamsulosin (FLOMAX) capsule 0.4 mg  0.4 mg Oral Daily Skip Mayer A, MD   timolol (TIMOPTIC) 0.5 % ophthalmic solution 1 drop  1 drop Both Eyes Daily Lurline Del, MD   traMADol Janean Sark) tablet 50 mg  50 mg Oral Q8H Lurline Del, MD   Outpatient Medications Prior to Visit  Medication Sig   amLODipine (NORVASC) 10 MG tablet TAKE 1 TABLET BY MOUTH ONCE A DAY   aspirin 81 MG chewable tablet Chew by mouth daily.   ceFAZolin (ANCEF) IVPB Inject 6 g into the vein continuous for 23 days. Infuse cefazolin 6gm daily over 24h as continuous infusion Indication:  MSSA bacteremia First Dose: Yes Last Day of Therapy:  03/08/2023 Labs - Once weekly:  CBC/D and CMP Please pull PIC at completion of IV antibiotics Fax weekly lab results  promptly to [phone] Method of administration: elastomeric Method of administration may be changed at the discretion of home infusion pharmacist based upon assessment of the patient and/or caregiver's ability to self-administer the medication ordered.   ceFAZolin (ANCEF) IVPB Inject 6 g into the vein continuous. 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   diclofenac Sodium (VOLTAREN) 1 % GEL APPLY 4G TOPICALLY  FIVE TIMES DAILY   fexofenadine (ALLEGRA ALLERGY) 180 MG tablet Take 1 tablet (180  mg total) by mouth daily.   finasteride (PROSCAR) 5 MG tablet Take 1 tablet (5 mg total) by mouth daily.   gabapentin (NEURONTIN) 300 MG capsule Take 1 capsule (300 mg total) by mouth at bedtime as needed.   latanoprost (XALATAN) 0.005 % ophthalmic solution Place 1 drop into both eyes at bedtime.    menthol-cetylpyridinium (CEPACOL) 3 MG lozenge Take 1 lozenge (3 mg total) by mouth as needed for sore throat.   mirtazapine (REMERON) 30 MG tablet TAKE 1 TABLET BY MOUTH EVERY NIGHT AT BEDTIME   mometasone (ELOCON) 0.1 % cream Apply 1 application. topically daily as needed (Rash). Qd up to 5 days a week to itchy bumps on body   Multiple Vitamin (MULTIVITAMIN) tablet Take 1 tablet by mouth daily.   mupirocin ointment (BACTROBAN) 2 % Apply 1 application. topically daily. Qd to open sores (Patient not taking: Reported on 03/02/2023)   Probiotic Product (DIGESTIVE ADVANTAGE PO) Take 1 tablet by mouth daily.   psyllium (METAMUCIL) 58.6 % powder Take 1 packet by mouth daily as needed (regularity).    rosuvastatin (CRESTOR) 20 MG tablet Take 1 tablet (20 mg total) by mouth daily.   tamsulosin (FLOMAX) 0.4 MG CAPS capsule Take 1 capsule (0.4 mg total) by mouth daily.   timolol (TIMOPTIC) 0.5 % ophthalmic solution Place 1 drop into both eyes daily.    tiZANidine (ZANAFLEX) 4 MG tablet TAKE ONE TABLET (4 MG TOTAL) BY MOUTH EVERY EIGHT (EIGHT) HOURS AS NEEDED FOR MUSCLE SPASMS.   traMADol (ULTRAM) 50 MG tablet Take 1 tablet (50 mg total) by mouth every 8 (eight) hours. Take 1 tablet in the morning and then another 8 hours later. Do not take with gabapentin or tizanidine    Review of Systems  Constitutional: Negative.   HENT: Negative.    Eyes: Negative.   Respiratory: Negative.    Cardiovascular: Negative.   Gastrointestinal: Negative.   Endocrine: Negative.   Genitourinary: Negative.   Musculoskeletal: Negative.   Skin: Negative.   Allergic/Immunologic: Negative.   Neurological: Negative.    Hematological: Negative.   Psychiatric/Behavioral: Negative.      {Labs  Heme  Chem  Endocrine  Serology  Results Review (optional):23779}  Objective    There were no vitals taken for this visit. {Show previous vital signs (optional):23777}   Physical Exam  ***  Last depression screening scores    02/23/2023   10:47 AM 01/04/2023    1:13 PM 12/31/2021    1:14 PM  PHQ 2/9 Scores  PHQ - 2 Score 5 0 0  PHQ- 9 Score 20     Last fall risk screening    02/23/2023   10:46 AM  Fall Risk   Falls in the past year? 0  Number falls in past yr: 0  Risk for fall due to : History of fall(s);Impaired mobility;Impaired balance/gait   Last Audit-C alcohol use screening    02/23/2023   10:48 AM  Alcohol Use Disorder Test (AUDIT)  1. How often do you have a drink containing alcohol? 0  3. How often do you have six or more drinks on one occasion? 0   A score of 3 or more in women, and 4 or more in men indicates increased risk for alcohol abuse, EXCEPT if all of the points are from question 1   No results found for any visits on 03/09/23.  Assessment & Plan  Routine Health Maintenance and Physical Exam  Exercise Activities and Dietary recommendations  Goals      Cut out extra servings     Recommend to cut out extra servings after dinner. Advised if going to have a snack to make it healthy, no sweets.       DIET - EAT MORE FRUITS AND VEGETABLES        Immunization History  Administered Date(s) Administered   Fluad Quad(high Dose 65+) 08/03/2019, 08/26/2020, 09/09/2021, 08/03/2022   Influenza Split 09/03/2006, 08/12/2007, 07/26/2008, 08/09/2012   Influenza, High Dose Seasonal PF 08/10/2014, 08/18/2015, 08/10/2016, 07/28/2017, 08/16/2018   Influenza,inj,Quad PF,6+ Mos 07/31/2013   PFIZER(Purple Top)SARS-COV-2 Vaccination 12/26/2019, 01/16/2020, 08/14/2020   Pneumococcal Conjugate-13 11/27/2014   Pneumococcal Polysaccharide-23 12/22/2011   Tdap 12/22/2011   Zoster  Recombinat (Shingrix) 05/25/2017, 07/28/2017   Zoster, Live 11/26/2011    Health Maintenance  Topic Date Due   DTaP/Tdap/Td (2 - Td or Tdap) 12/21/2021   COVID-19 Vaccine (4 - 2023-24 season) 07/09/2022   INFLUENZA VACCINE  06/09/2023   Medicare Annual Wellness (AWV)  01/05/2024   Pneumonia Vaccine 11+ Years old  Completed   Hepatitis C Screening  Completed   Zoster Vaccines- Shingrix  Completed   HPV VACCINES  Aged Out   COLONOSCOPY (Pts 45-78yrs Insurance coverage will need to be confirmed)  Discontinued    Discussed health benefits of physical activity, and encouraged him to engage in regular exercise appropriate for his age and condition.  ***  No follow-ups on file.     {provider attestation***:1}   Brett Merry, MD  [facility] [phone] (phone) [phone] (fax)  [facility]

## 2023-03-09 ENCOUNTER — Encounter: Payer: Medicare Other | Admitting: Family Medicine

## 2023-03-09 ENCOUNTER — Telehealth: Payer: Self-pay | Admitting: *Deleted

## 2023-03-09 LAB — AEROBIC/ANAEROBIC CULTURE W GRAM STAIN (SURGICAL/DEEP WOUND)

## 2023-03-09 NOTE — Transitions of Care (Post Inpatient/ED Visit) (Signed)
03/09/2023  Name: Brett Bender MRN: [id number] DOB: 1945-12-08  Today's TOC FU Call Status: Today's TOC FU Call Status:: Successful TOC FU Call Competed TOC FU Call Complete Date: 03/09/23  Transition Care Management Follow-up Telephone Call Date of Discharge: 03/08/23 Discharge Facility: [facility]) Type of Discharge: Inpatient Admission Primary Inpatient Discharge Diagnosis:: Epidural abscess How have you been since you were released from the hospital?: Better Any questions or concerns?: No  Items Reviewed: Did you receive and understand the discharge instructions provided?: Yes Medications obtained,verified, and reconciled?: Yes (Medications Reviewed) Any new allergies since your discharge?: No Dietary orders reviewed?: No Do you have support at home?: Yes People in Home: spouse Name of Support/Comfort Primary Source: Dahlia Client  Medications Reviewed Today: Medications Reviewed Today     Reviewed by Luella Cook, RN (Case Manager) on 03/09/23 at 1057  Med List Status: <None>   Medication Order Taking? Sig Documenting Provider Last Dose Status Informant  amLODipine (NORVASC) 10 MG tablet [id number] Yes TAKE 1 TABLET BY MOUTH ONCE A DAY Malva Limes, MD Taking Active Self  aspirin 81 MG chewable tablet [id number] Yes Chew by mouth daily. [provider] Taking Active Other, Self  ceFAZolin (ANCEF) IVPB [id number] Yes Inject 6 g into the vein continuous. 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 Loyce Dys, MD Taking Active   diclofenac Sodium (VOLTAREN) 1 % GEL [id number] Yes APPLY 4G TOPICALLY FIVE TIMES DAILY Malva Limes, MD Taking Active Self  fexofenadine [facility] ALLERGY) 180 MG tablet [id number] No Take 1 tablet (180 mg total) by mouth daily.  Patient not taking: Reported on 03/09/2023   Loura Pardon, MD Not Taking Active Self  finasteride (PROSCAR) 5 MG tablet [id number] Yes Take 1 tablet (5 mg total) by mouth daily. Harle Battiest, PA-C Taking Active Self  gabapentin (NEURONTIN) 300 MG capsule [id number] Yes Take 1 capsule (300 mg total) by mouth at bedtime as needed. [name], PA-C Taking Active   latanoprost (XALATAN) 0.005 % ophthalmic solution [id number] Yes Place 1 drop into both eyes at bedtime.  [provider] Taking Active Self  menthol-cetylpyridinium (CEPACOL) 3 MG lozenge [id number] No Take 1 lozenge (3 mg total) by mouth as needed for sore throat.  Patient not taking: Reported on 03/09/2023   Loyce Dys, MD Not Taking Active   mirtazapine (REMERON) 30 MG tablet [id number] Yes TAKE 1 TABLET BY MOUTH EVERY NIGHT AT BEDTIME Ostwalt, Edmon Crape, PA-C Taking Active Self  mometasone (ELOCON) 0.1 % cream [id number] Yes Apply 1 application. topically daily as needed (Rash). Qd up to 5 days a week to itchy bumps on body Deirdre Evener, MD Taking Active Self  Multiple Vitamin (MULTIVITAMIN) tablet [id number] No Take 1 tablet by mouth daily.  Patient not taking: Reported on 03/09/2023   [provider] Not Taking Active Other, Self  Probiotic Product (DIGESTIVE ADVANTAGE PO) [id number] Yes Take 1 tablet by mouth daily. [provider] Taking Active Other, Self  psyllium (METAMUCIL) 58.6 % powder  [id number] Yes Take 1 packet by mouth daily as needed (regularity).  [provider] Taking Active Other, Self  rosuvastatin (CRESTOR) 20 MG tablet [id number] Yes Take 1 tablet (20 mg  total) by mouth daily. Jacky Kindle, FNP Taking Active   tamsulosin [facility]) 0.4 MG CAPS capsule [id number] Yes Take 1 capsule (0.4 mg total) by mouth daily. Harle Battiest, PA-C Taking Active Self  timolol (TIMOPTIC) 0.5 % ophthalmic solution [id number] Yes Place 1 drop into both eyes daily.  [provider] Taking Active Self  tiZANidine (ZANAFLEX) 4 MG tablet [id number] No TAKE ONE TABLET (4 MG TOTAL) BY MOUTH EVERY EIGHT (EIGHT) HOURS AS NEEDED FOR MUSCLE SPASMS.  Patient not taking: Reported on 03/09/2023   Malva Limes, MD Not Taking Active   traMADol (ULTRAM) 50 MG tablet [id number] Yes Take 1 tablet (50 mg total) by mouth every 8 (eight) hours. Take 1 tablet in the morning and then another 8 hours later. Do not take with gabapentin or tizanidine [name], PA-C Taking Active             Home Care and Equipment/Supplies: Were Home Health Services Ordered?: Yes Name of Home Health Agency:: [facility] Has Agency set up a time to come to your home?: Yes First Home Health Visit Date: 03/10/23 Any new equipment or medical supplies ordered?: Yes Name of Medical supply agency?: [facility] Were you able to get the equipment/medical supplies?: Yes Do you have any questions related to the use of the equipment/supplies?: No  Functional Questionnaire: Do you need assistance with bathing/showering or dressing?: Yes Do you need assistance with meal preparation?: Yes Do you need assistance with eating?: No Do you have difficulty maintaining continence: No Do you need assistance with getting out of bed/getting out of a chair/moving?: No Do you have difficulty managing or taking your medications?: No  Follow up appointments reviewed: PCP Follow-up appointment confirmed?: Yes Date of PCP follow-up appointment?: 03/16/23 Follow-up Provider: Dr Sherrie Mustache 8:00 Specialist Hospital Follow-up appointment confirmed?: Yes Date of Specialist follow-up appointment?:  03/22/23 Follow-Up Specialty Provider:: Infectious disease 11:15 Do you need transportation to your follow-up appointment?: No Do you understand care options if your condition(s) worsen?: Yes-patient verbalized understanding  SDOH Interventions Today    Flowsheet Row Most Recent Value  SDOH Interventions   Food Insecurity Interventions Intervention Not Indicated  Housing Interventions Intervention Not Indicated  Transportation Interventions Intervention Not Indicated      Interventions Today    Flowsheet Row Most Recent Value  General Interventions   General Interventions Discussed/Reviewed General Interventions Discussed, General Interventions Reviewed, Doctor Visits, Referral to Nurse  [scheduled with [facility]  [id number] 2:30]  Doctor Visits Discussed/Reviewed Doctor Visits Discussed, Doctor Visits Reviewed  Communication with RN  [facility] Coordination [facility] [address]]  Pharmacy Interventions   Pharmacy Dicussed/Reviewed Pharmacy Topics Discussed, Pharmacy Topics Reviewed, Medications and their functions      TOC Interventions Today    Flowsheet Row Most Recent Value  TOC Interventions   TOC Interventions Discussed/Reviewed TOC Interventions Discussed, TOC Interventions Reviewed       Gean Maidens BSN RN [facility] [phone]

## 2023-03-10 DIAGNOSIS — F419 Anxiety disorder, unspecified: Secondary | ICD-10-CM | POA: Diagnosis not present

## 2023-03-10 DIAGNOSIS — I10 Essential (primary) hypertension: Secondary | ICD-10-CM | POA: Diagnosis not present

## 2023-03-10 DIAGNOSIS — F32A Depression, unspecified: Secondary | ICD-10-CM | POA: Diagnosis not present

## 2023-03-10 DIAGNOSIS — G9341 Metabolic encephalopathy: Secondary | ICD-10-CM | POA: Diagnosis not present

## 2023-03-10 DIAGNOSIS — N400 Enlarged prostate without lower urinary tract symptoms: Secondary | ICD-10-CM | POA: Diagnosis not present

## 2023-03-10 DIAGNOSIS — A4101 Sepsis due to Methicillin susceptible Staphylococcus aureus: Secondary | ICD-10-CM | POA: Diagnosis not present

## 2023-03-14 ENCOUNTER — Telehealth: Payer: Self-pay

## 2023-03-14 DIAGNOSIS — F32A Depression, unspecified: Secondary | ICD-10-CM | POA: Diagnosis not present

## 2023-03-14 DIAGNOSIS — A4101 Sepsis due to Methicillin susceptible Staphylococcus aureus: Secondary | ICD-10-CM | POA: Diagnosis not present

## 2023-03-14 DIAGNOSIS — G9341 Metabolic encephalopathy: Secondary | ICD-10-CM | POA: Diagnosis not present

## 2023-03-14 DIAGNOSIS — F419 Anxiety disorder, unspecified: Secondary | ICD-10-CM | POA: Diagnosis not present

## 2023-03-14 DIAGNOSIS — N400 Enlarged prostate without lower urinary tract symptoms: Secondary | ICD-10-CM | POA: Diagnosis not present

## 2023-03-14 DIAGNOSIS — I10 Essential (primary) hypertension: Secondary | ICD-10-CM | POA: Diagnosis not present

## 2023-03-14 DIAGNOSIS — A419 Sepsis, unspecified organism: Secondary | ICD-10-CM | POA: Diagnosis not present

## 2023-03-14 NOTE — Telephone Encounter (Signed)
Copied from CRM [id number]. Topic: General - Other >> Mar 14, 2023  2:59 PM Epimenio Foot F wrote: Reason for CRM: Home Health Verbal Orders - Caller/Agency: [facility] Number: [phone] Requesting OT/PT/Skilled Nursing/Social Work/Speech Therapy: PT  Frequency: 2 week 2 1 week 3

## 2023-03-14 NOTE — Telephone Encounter (Signed)
That's fine

## 2023-03-14 NOTE — Telephone Encounter (Signed)
Verbals given  

## 2023-03-15 DIAGNOSIS — Z87442 Personal history of urinary calculi: Secondary | ICD-10-CM | POA: Diagnosis not present

## 2023-03-15 DIAGNOSIS — N401 Enlarged prostate with lower urinary tract symptoms: Secondary | ICD-10-CM | POA: Diagnosis not present

## 2023-03-15 DIAGNOSIS — Z7982 Long term (current) use of aspirin: Secondary | ICD-10-CM | POA: Diagnosis not present

## 2023-03-15 DIAGNOSIS — E876 Hypokalemia: Secondary | ICD-10-CM | POA: Diagnosis not present

## 2023-03-15 DIAGNOSIS — H3530 Unspecified macular degeneration: Secondary | ICD-10-CM | POA: Diagnosis not present

## 2023-03-15 DIAGNOSIS — Z792 Long term (current) use of antibiotics: Secondary | ICD-10-CM | POA: Diagnosis not present

## 2023-03-15 DIAGNOSIS — M48061 Spinal stenosis, lumbar region without neurogenic claudication: Secondary | ICD-10-CM | POA: Diagnosis not present

## 2023-03-15 DIAGNOSIS — M5417 Radiculopathy, lumbosacral region: Secondary | ICD-10-CM | POA: Diagnosis not present

## 2023-03-15 DIAGNOSIS — E785 Hyperlipidemia, unspecified: Secondary | ICD-10-CM | POA: Diagnosis not present

## 2023-03-15 DIAGNOSIS — I700 Atherosclerosis of aorta: Secondary | ICD-10-CM | POA: Diagnosis not present

## 2023-03-15 DIAGNOSIS — R338 Other retention of urine: Secondary | ICD-10-CM | POA: Diagnosis not present

## 2023-03-15 DIAGNOSIS — F32A Depression, unspecified: Secondary | ICD-10-CM | POA: Diagnosis not present

## 2023-03-15 DIAGNOSIS — Z85828 Personal history of other malignant neoplasm of skin: Secondary | ICD-10-CM | POA: Diagnosis not present

## 2023-03-15 DIAGNOSIS — M1990 Unspecified osteoarthritis, unspecified site: Secondary | ICD-10-CM | POA: Diagnosis not present

## 2023-03-15 DIAGNOSIS — G9341 Metabolic encephalopathy: Secondary | ICD-10-CM | POA: Diagnosis not present

## 2023-03-15 DIAGNOSIS — G43909 Migraine, unspecified, not intractable, without status migrainosus: Secondary | ICD-10-CM | POA: Diagnosis not present

## 2023-03-15 DIAGNOSIS — G061 Intraspinal abscess and granuloma: Secondary | ICD-10-CM | POA: Diagnosis not present

## 2023-03-15 DIAGNOSIS — M4646 Discitis, unspecified, lumbar region: Secondary | ICD-10-CM | POA: Diagnosis not present

## 2023-03-15 DIAGNOSIS — K760 Fatty (change of) liver, not elsewhere classified: Secondary | ICD-10-CM | POA: Diagnosis not present

## 2023-03-15 DIAGNOSIS — M869 Osteomyelitis, unspecified: Secondary | ICD-10-CM | POA: Diagnosis not present

## 2023-03-15 DIAGNOSIS — F419 Anxiety disorder, unspecified: Secondary | ICD-10-CM | POA: Diagnosis not present

## 2023-03-15 DIAGNOSIS — A4101 Sepsis due to Methicillin susceptible Staphylococcus aureus: Secondary | ICD-10-CM | POA: Diagnosis not present

## 2023-03-15 DIAGNOSIS — Z452 Encounter for adjustment and management of vascular access device: Secondary | ICD-10-CM | POA: Diagnosis not present

## 2023-03-15 DIAGNOSIS — M5136 Other intervertebral disc degeneration, lumbar region: Secondary | ICD-10-CM | POA: Diagnosis not present

## 2023-03-15 DIAGNOSIS — I10 Essential (primary) hypertension: Secondary | ICD-10-CM | POA: Diagnosis not present

## 2023-03-15 NOTE — Progress Notes (Unsigned)
Vivien Rota DeSanto,acting as a scribe for [name], MD.,have documented all relevant documentation on the behalf of [name], MD,as directed by  [name], MD     Established patient visit   Patient: Brett Bender   DOB: 04/26/1946   77 y.o. Male  MRN: [id number] Visit Date: 03/16/2023  Today's healthcare provider: [name], MD    Subjective    HPI   Hospital follow up 4/24 through 03/08/2023 for sepsis, discitis epidural abscess.  Patient is slowly improving. Is on IV antibiotic administered at home by his wife with ID follow up scheduled 5/14 an neurosurgery in June. He is ambulating mostly with cane and using walking when walking around longer distances outside.  He has been doing home health physical therapy and home nurse changes his PICC line and drawing blood weekly.  He is taking the tramadol twice a day and states it is helping. He is taking gabapentin at bedtime and sleeping well. He does having some pain radiating into right leg which feels a little weak and heavy.  He is also here to follow up on statin since starting rosuvastatin 20mg  in April.  The 10-year ASCVD risk score (Arnett DK, et al., 2019) is: 34.8%  Lab Results  Component Value Date   CHOL 164 02/23/2023   HDL 42 02/23/2023   LDLCALC 100 (H) 02/23/2023   TRIG 120 02/23/2023   CHOLHDL 3.9 02/23/2023  He was also noted to have aortic atherosclerosis and calcification of LAD on CTA done at hospitalization in April. He is tolerating statin well with no adverse effects.   Medications: Outpatient Medications Prior to Visit  Medication Sig   amLODipine (NORVASC) 10 MG tablet TAKE 1 TABLET BY MOUTH ONCE A DAY   aspirin 81 MG chewable tablet Chew by mouth daily.   ceFAZolin (ANCEF) IVPB Inject 6 g into the vein continuous. Cefazolin 6gm IV  as continuous infusion over 24hr Indication:  MSSA bacteremia with epidural abscess and discitis First Dose: Yes Last Day of Therapy:  04/13/2023 Labs -  Once weekly:  CBC/D, CMP, ESR and CRP Please leave PIC in place until doctor has seen patient or been notified Fax weekly labs to [phone] Method of administration: elastomeric Method of administration may be changed at the discretion of home infusion pharmacist based upon assessment of the patient and/or caregiver's ability to self-administer the medication ordered.   diclofenac Sodium (VOLTAREN) 1 % GEL APPLY 4G TOPICALLY FIVE TIMES DAILY   finasteride (PROSCAR) 5 MG tablet Take 1 tablet (5 mg total) by mouth daily.   gabapentin (NEURONTIN) 300 MG capsule Take 1 capsule (300 mg total) by mouth at bedtime as needed.   latanoprost (XALATAN) 0.005 % ophthalmic solution Place 1 drop into both eyes at bedtime.    mirtazapine (REMERON) 30 MG tablet TAKE 1 TABLET BY MOUTH EVERY NIGHT AT BEDTIME   mometasone (ELOCON) 0.1 % cream Apply 1 application. topically daily as needed (Rash). Qd up to 5 days a week to itchy bumps on body   Multiple Vitamin (MULTIVITAMIN) tablet Take 1 tablet by mouth daily.   Probiotic Product (DIGESTIVE ADVANTAGE PO) Take 1 tablet by mouth daily.   psyllium (METAMUCIL) 58.6 % powder Take 1 packet by mouth daily as needed (regularity).    rosuvastatin (CRESTOR) 20 MG tablet Take 1 tablet (20 mg total) by mouth daily.   tamsulosin (FLOMAX) 0.4 MG CAPS capsule Take 1 capsule (0.4 mg total) by mouth daily.   timolol (TIMOPTIC) 0.5 %  ophthalmic solution Place 1 drop into both eyes daily.    traMADol (ULTRAM) 50 MG tablet Take 1 tablet (50 mg total) by mouth every 8 (eight) hours. Take 1 tablet in the morning and then another 8 hours later. Do not take with gabapentin or tizanidine   fexofenadine (ALLEGRA ALLERGY) 180 MG tablet Take 1 tablet (180 mg total) by mouth daily. (Patient not taking: Reported on 03/09/2023)   menthol-cetylpyridinium (CEPACOL) 3 MG lozenge Take 1 lozenge (3 mg total) by mouth as needed for sore throat. (Patient not taking: Reported on 03/09/2023)    tiZANidine (ZANAFLEX) 4 MG tablet TAKE ONE TABLET (4 MG TOTAL) BY MOUTH EVERY EIGHT (EIGHT) HOURS AS NEEDED FOR MUSCLE SPASMS. (Patient not taking: Reported on 03/09/2023)   No facility-administered medications prior to visit.    Review of Systems     Objective    BP (!) 142/84 (BP Location: Left Arm, Patient Position: Sitting, Cuff Size: Normal)   Pulse (!) 58   Temp 97.9 F (36.6 C) (Oral)   Wt 193 lb (87.5 kg)   SpO2 97%   BMI 24.12 kg/m    Physical Exam   General: Appearance:    Well developed, well nourished male in no acute distress. Sitting on exam table comfortable. Able to walk with assistance of can.   Eyes:    PERRL, conjunctiva/corneas clear, EOM's intact       Lungs:     Clear to auscultation bilaterally, respirations unlabored  Heart:    Bradycardic. Normal rhythm. No murmurs, rubs, or gallops.    MS:   All extremities are intact.  Slightly diminished strength of right leg.   Neurologic:   Awake, alert, oriented x 3. No apparent focal neurological defect.          Assessment & Plan     1. Lumbar radiculopathy refill traMADol (ULTRAM) 50 MG tablet; Take 1 tablet (50 mg total) by mouth every 8 (eight) hours as needed. Do not take with gabapentin or tizanidine  Dispense: 30 tablet; Refill: 1  2. Epidural abscess Improving slowly but steadily. Home IV antibiotic and weekly labs per ID. Follow up as scheduled next week and NS as scheduled in June.   3. Coronary artery calcification seen on CT scan   4. Aortic atherosclerosis (HCC) He is tolerating rosuvastatin well with no adverse effects.    Follow up about 8 weeks to check lipids.       The entirety of the information documented in the History of Present Illness, Review of Systems and Physical Exam were personally obtained by me. Portions of this information were initially documented by the CMA and reviewed by me for thoroughness and accuracy.     [name], MD  [facility] [phone] (phone) [phone] (fax)  [facility]

## 2023-03-16 ENCOUNTER — Ambulatory Visit (INDEPENDENT_AMBULATORY_CARE_PROVIDER_SITE_OTHER): Payer: Medicare Other | Admitting: Family Medicine

## 2023-03-16 VITALS — BP 142/84 | HR 58 | Temp 97.9°F | Wt 193.0 lb

## 2023-03-16 DIAGNOSIS — M5416 Radiculopathy, lumbar region: Secondary | ICD-10-CM

## 2023-03-16 DIAGNOSIS — G062 Extradural and subdural abscess, unspecified: Secondary | ICD-10-CM

## 2023-03-16 DIAGNOSIS — I2510 Atherosclerotic heart disease of native coronary artery without angina pectoris: Secondary | ICD-10-CM | POA: Insufficient documentation

## 2023-03-16 DIAGNOSIS — I700 Atherosclerosis of aorta: Secondary | ICD-10-CM

## 2023-03-16 MED ORDER — TRAMADOL HCL 50 MG PO TABS
50.0000 mg | ORAL_TABLET | Freq: Three times a day (TID) | ORAL | 1 refills | Status: DC | PRN
Start: 2023-03-16 — End: 2023-04-22

## 2023-03-16 NOTE — Patient Instructions (Signed)
.   Please review the attached list of medications and notify my office if there are any errors.   . Please bring all of your medications to every appointment so we can make sure that our medication list is the same as yours.   

## 2023-03-17 ENCOUNTER — Ambulatory Visit (INDEPENDENT_AMBULATORY_CARE_PROVIDER_SITE_OTHER): Payer: Medicare Other | Admitting: Dermatology

## 2023-03-17 VITALS — BP 159/89 | HR 96

## 2023-03-17 DIAGNOSIS — L578 Other skin changes due to chronic exposure to nonionizing radiation: Secondary | ICD-10-CM | POA: Diagnosis not present

## 2023-03-17 DIAGNOSIS — L570 Actinic keratosis: Secondary | ICD-10-CM | POA: Diagnosis not present

## 2023-03-17 DIAGNOSIS — Z85828 Personal history of other malignant neoplasm of skin: Secondary | ICD-10-CM

## 2023-03-17 DIAGNOSIS — D1801 Hemangioma of skin and subcutaneous tissue: Secondary | ICD-10-CM

## 2023-03-17 DIAGNOSIS — X32XXXA Exposure to sunlight, initial encounter: Secondary | ICD-10-CM

## 2023-03-17 DIAGNOSIS — L814 Other melanin hyperpigmentation: Secondary | ICD-10-CM | POA: Diagnosis not present

## 2023-03-17 DIAGNOSIS — L821 Other seborrheic keratosis: Secondary | ICD-10-CM | POA: Diagnosis not present

## 2023-03-17 DIAGNOSIS — M4620 Osteomyelitis of vertebra, site unspecified: Secondary | ICD-10-CM | POA: Diagnosis not present

## 2023-03-17 DIAGNOSIS — D492 Neoplasm of unspecified behavior of bone, soft tissue, and skin: Secondary | ICD-10-CM

## 2023-03-17 DIAGNOSIS — L820 Inflamed seborrheic keratosis: Secondary | ICD-10-CM | POA: Diagnosis not present

## 2023-03-17 DIAGNOSIS — W908XXA Exposure to other nonionizing radiation, initial encounter: Secondary | ICD-10-CM

## 2023-03-17 DIAGNOSIS — D229 Melanocytic nevi, unspecified: Secondary | ICD-10-CM

## 2023-03-17 DIAGNOSIS — C44212 Basal cell carcinoma of skin of right ear and external auricular canal: Secondary | ICD-10-CM | POA: Diagnosis not present

## 2023-03-17 DIAGNOSIS — C4492 Squamous cell carcinoma of skin, unspecified: Secondary | ICD-10-CM

## 2023-03-17 HISTORY — DX: Squamous cell carcinoma of skin, unspecified: C44.92

## 2023-03-17 NOTE — Progress Notes (Signed)
Follow-Up Visit   Subjective  Brett Bender is a 77 y.o. male who presents for the following: Actinic keratosis 6 months f/u  The patient has spots, moles and lesions to be evaluated, some may be new or changing and the patient may have concern these could be cancer.  Patient been hospitalized for a infection in his blood   The following portions of the chart were reviewed this encounter and updated as appropriate: medications, allergies, medical history  Review of Systems:  No other skin or systemic complaints except as noted in HPI or Assessment and Plan.  Objective  Well appearing patient in no apparent distress; mood and affect are within normal limits. A focused examination was performed of the following areas:face,scalp,arms,hands,legs  Relevant exam findings are noted in the Assessment and Plan.  back,chest (20) Stuck-on, waxy, tan-brown papules --Discussed benign etiology and prognosis.   face,ears (22) Erythematous thin papules/macules with gritty scale.   right inferior post auricular 1.2 cm Crusted ulcer         Assessment & Plan   Inflamed seborrheic keratosis (20) back,chest  Symptomatic, irritating, patient would like treated.   Destruction of lesion - back,chest Complexity: simple   Destruction method: cryotherapy   Informed consent: discussed and consent obtained   Timeout:  patient name, date of birth, surgical site, and procedure verified Lesion destroyed using liquid nitrogen: Yes   Region frozen until ice ball extended beyond lesion: Yes   Outcome: patient tolerated procedure well with no complications   Post-procedure details: wound care instructions given    AK (actinic keratosis) (22) face,ears  Actinic keratoses are precancerous spots that appear secondary to cumulative UV radiation exposure/sun exposure over time. They are chronic with expected duration over 1 year. A portion of actinic keratoses will progress to squamous cell  carcinoma of the skin. It is not possible to reliably predict which spots will progress to skin cancer and so treatment is recommended to prevent development of skin cancer.  Recommend daily broad spectrum sunscreen SPF 30+ to sun-exposed areas, reapply every 2 hours as needed.  Recommend staying in the shade or wearing long sleeves, sun glasses (UVA+UVB protection) and wide brim hats (4-inch brim around the entire circumference of the hat). Call for new or changing lesions.   Destruction of lesion - face,ears Complexity: simple   Destruction method: cryotherapy   Informed consent: discussed and consent obtained   Timeout:  patient name, date of birth, surgical site, and procedure verified Lesion destroyed using liquid nitrogen: Yes   Region frozen until ice ball extended beyond lesion: Yes   Outcome: patient tolerated procedure well with no complications   Post-procedure details: wound care instructions given    Neoplasm of skin right inferior post auricular  Skin / nail biopsy Type of biopsy: tangential   Informed consent: discussed and consent obtained   Patient was prepped and draped in usual sterile fashion: area prepped with alochol. Anesthesia: the lesion was anesthetized in a standard fashion   Anesthetic:  1% lidocaine w/ epinephrine 1-100,000 buffered w/ 8.4% NaHCO3 Instrument used: flexible razor blade   Hemostasis achieved with: pressure, aluminum chloride and electrodesiccation   Outcome: patient tolerated procedure well   Post-procedure details: wound care instructions given   Post-procedure details comment:  Ointment and small bandage  Specimen 1 - Surgical pathology Differential Diagnosis: R/O skin cancer   Check Margins: No  Actinic skin damage  Lentigo  Melanocytic nevus, unspecified location  History of basal cell carcinoma  Osteomyelitis of spine (HCC)  LENTIGINES, SEBORRHEIC KERATOSES, HEMANGIOMAS - Benign normal skin lesions - Benign-appearing -  Call for any changes  MELANOCYTIC NEVI - Tan-brown and/or pink-flesh-colored symmetric macules and papules - Benign appearing on exam today - Observation - Call clinic for new or changing moles - Recommend daily use of broad spectrum spf 30+ sunscreen to sun-exposed areas.   ACTINIC DAMAGE - Chronic condition, secondary to cumulative UV/sun exposure - diffuse scaly erythematous macules with underlying dyspigmentation - Recommend daily broad spectrum sunscreen SPF 30+ to sun-exposed areas, reapply every 2 hours as needed.  - Staying in the shade or wearing long sleeves, sun glasses (UVA+UVB protection) and wide brim hats (4-inch brim around the entire circumference of the hat) are also recommended for sun protection.  - Call for new or changing lesions.  HISTORY OF BASAL CELL CARCINOMA OF THE SKIN - No evidence of recurrence today - Recommend regular full body skin exams - Recommend daily broad spectrum sunscreen SPF 30+ to sun-exposed areas, reapply every 2 hours as needed.  - Call if any new or changing lesions are noted between office visits    Osteomyelitis of spine currently on long term antibiotics  The patient tends to be a picker and has picked skin lesions for many many years.  He and his doctors who diagnosed the osteomyelitis feel that it started as a sore that he picked resulting in sepsis and subsequent osteomyelitis.  He now is more conscientious of picking at spots. Recent blood culture taken at the hospital  02/08/2023 showed STAPHYLOCOCCUS AUREUS   SKIN CANCER SCREENING PERFORMED TODAY   Return in about 6 months (around 09/17/2023) for TBSE, hx of BCC.  IAngelique Holm, CMA, am acting as scribe for Armida Sans, MD .   Documentation: I have reviewed the above documentation for accuracy and completeness, and I agree with the above.  Armida Sans, MD

## 2023-03-17 NOTE — Patient Instructions (Addendum)
Wound Care Instructions  Cleanse wound gently with soap and water once a day then pat dry with clean gauze. Apply a thin coat of Petrolatum (petroleum jelly, "Vaseline") over the wound (unless you have an allergy to this). We recommend that you use a new, sterile tube of Vaseline. Do not pick or remove scabs. Do not remove the yellow or white "healing tissue" from the base of the wound.  Cover the wound with fresh, clean, nonstick gauze and secure with paper tape. You may use Band-Aids in place of gauze and tape if the wound is small enough, but would recommend trimming much of the tape off as there is often too much. Sometimes Band-Aids can irritate the skin.  You should call the office for your biopsy report after 1 week if you have not already been contacted.  If you experience any problems, such as abnormal amounts of bleeding, swelling, significant bruising, significant pain, or evidence of infection, please call the office immediately.  FOR ADULT SURGERY PATIENTS: If you need something for pain relief you may take 1 extra strength Tylenol (acetaminophen) AND 2 Ibuprofen (200mg each) together every 4 hours as needed for pain. (do not take these if you are allergic to them or if you have a reason you should not take them.) Typically, you may only need pain medication for 1 to 3 days.      Cryotherapy Aftercare  Wash gently with soap and water everyday.   Apply Vaseline and Band-Aid daily until healed.      Due to recent changes in healthcare laws, you may see results of your pathology and/or laboratory studies on MyChart before the doctors have had a chance to review them. We understand that in some cases there may be results that are confusing or concerning to you. Please understand that not all results are received at the same time and often the doctors may need to interpret multiple results in order to provide you with the best plan of care or course of treatment. Therefore, we ask that  you please give us 2 business days to thoroughly review all your results before contacting the office for clarification. Should we see a critical lab result, you will be contacted sooner.   If You Need Anything After Your Visit  If you have any questions or concerns for your doctor, please call our main line at [phone] and press option 4 to reach your doctor's medical assistant. If no one answers, please leave a voicemail as directed and we will return your call as soon as possible. Messages left after 4 pm will be answered the following business day.   You may also send us a message via MyChart. We typically respond to MyChart messages within 1-2 business days.  For prescription refills, please ask your pharmacy to contact our office. Our fax number is [phone].  If you have an urgent issue when the clinic is closed that cannot wait until the next business day, you can page your doctor at the number below.    Please note that while we do our best to be available for urgent issues outside of office hours, we are not available 24/7.   If you have an urgent issue and are unable to reach us, you may choose to seek medical care at your doctor's office, retail clinic, urgent care center, or emergency room.  If you have a medical emergency, please immediately call 911 or go to the emergency department.  Pager Numbers  - Dr.   Kowalski: [phone]  - Dr. Moye: [phone]  - Dr. Stewart: [phone]  In the event of inclement weather, please call our main line at [phone] for an update on the status of any delays or closures.  Dermatology Medication Tips: Please keep the boxes that topical medications come in in order to help keep track of the instructions about where and how to use these. Pharmacies typically print the medication instructions only on the boxes and not directly on the medication tubes.   If your medication is too expensive, please contact our office at  [phone] option 4 or send us a message through MyChart.   We are unable to tell what your co-pay for medications will be in advance as this is different depending on your insurance coverage. However, we may be able to find a substitute medication at lower cost or fill out paperwork to get insurance to cover a needed medication.   If a prior authorization is required to get your medication covered by your insurance company, please allow us 1-2 business days to complete this process.  Drug prices often vary depending on where the prescription is filled and some pharmacies may offer cheaper prices.  The website www.goodrx.com contains coupons for medications through different pharmacies. The prices here do not account for what the cost may be with help from insurance (it may be cheaper with your insurance), but the website can give you the price if you did not use any insurance.  - You can print the associated coupon and take it with your prescription to the pharmacy.  - You may also stop by our office during regular business hours and pick up a GoodRx coupon card.  - If you need your prescription sent electronically to a different pharmacy, notify our office through [facility] MyChart or by phone at [phone] option 4.     Si Usted Necesita Algo Despus de Su Visita  Tambin puede enviarnos un mensaje a travs de MyChart. Por lo general respondemos a los mensajes de MyChart en el transcurso de 1 a 2 das hbiles.  Para renovar recetas, por favor pida a su farmacia que se ponga en contacto con nuestra oficina. Nuestro nmero de fax es el [phone].  Si tiene un asunto urgente cuando la clnica est cerrada y que no puede esperar hasta el siguiente da hbil, puede llamar/localizar a su doctor(a) al nmero que aparece a continuacin.   Por favor, tenga en cuenta que aunque hacemos todo lo posible para estar disponibles para asuntos urgentes fuera del horario de oficina, no estamos  disponibles las 24 horas del da, los 7 das de la semana.   Si tiene un problema urgente y no puede comunicarse con nosotros, puede optar por buscar atencin mdica  en el consultorio de su doctor(a), en una clnica privada, en un centro de atencin urgente o en una sala de emergencias.  Si tiene una emergencia mdica, por favor llame inmediatamente al 911 o vaya a la sala de emergencias.  Nmeros de bper  - Dr. Kowalski: [phone]  - Dra. Moye: [phone]  - Dra. Stewart: [phone]  En caso de inclemencias del tiempo, por favor llame a nuestra lnea principal al [phone] para una actualizacin sobre el estado de cualquier retraso o cierre.  Consejos para la medicacin en dermatologa: Por favor, guarde las cajas en las que vienen los medicamentos de uso tpico para ayudarle a seguir las instrucciones sobre dnde y cmo usarlos. Las farmacias generalmente imprimen las instrucciones del medicamento slo   en las cajas y no directamente en los tubos del medicamento.   Si su medicamento es muy caro, por favor, pngase en contacto con nuestra oficina llamando al [phone] y presione la opcin 4 o envenos un mensaje a travs de MyChart.   No podemos decirle cul ser su copago por los medicamentos por adelantado ya que esto es diferente dependiendo de la cobertura de su seguro. Sin embargo, es posible que podamos encontrar un medicamento sustituto a menor costo o llenar un formulario para que el seguro cubra el medicamento que se considera necesario.   Si se requiere una autorizacin previa para que su compaa de seguros cubra su medicamento, por favor permtanos de 1 a 2 das hbiles para completar este proceso.  Los precios de los medicamentos varan con frecuencia dependiendo del lugar de dnde se surte la receta y alguna farmacias pueden ofrecer precios ms baratos.  El sitio web www.goodrx.com tiene cupones para medicamentos de diferentes farmacias. Los precios aqu no  tienen en cuenta lo que podra costar con la ayuda del seguro (puede ser ms barato con su seguro), pero el sitio web puede darle el precio si no utiliz ningn seguro.  - Puede imprimir el cupn correspondiente y llevarlo con su receta a la farmacia.  - Tambin puede pasar por nuestra oficina durante el horario de atencin regular y recoger una tarjeta de cupones de GoodRx.  - Si necesita que su receta se enve electrnicamente a una farmacia diferente, informe a nuestra oficina a travs de MyChart de [facility] o por telfono llamando al [phone] y presione la opcin 4.  

## 2023-03-21 ENCOUNTER — Ambulatory Visit: Payer: Self-pay | Admitting: *Deleted

## 2023-03-21 DIAGNOSIS — Z452 Encounter for adjustment and management of vascular access device: Secondary | ICD-10-CM | POA: Diagnosis not present

## 2023-03-21 DIAGNOSIS — M4646 Discitis, unspecified, lumbar region: Secondary | ICD-10-CM | POA: Diagnosis not present

## 2023-03-21 DIAGNOSIS — A4101 Sepsis due to Methicillin susceptible Staphylococcus aureus: Secondary | ICD-10-CM | POA: Diagnosis not present

## 2023-03-21 DIAGNOSIS — A419 Sepsis, unspecified organism: Secondary | ICD-10-CM | POA: Diagnosis not present

## 2023-03-21 DIAGNOSIS — I10 Essential (primary) hypertension: Secondary | ICD-10-CM | POA: Diagnosis not present

## 2023-03-21 DIAGNOSIS — M869 Osteomyelitis, unspecified: Secondary | ICD-10-CM | POA: Diagnosis not present

## 2023-03-21 DIAGNOSIS — G061 Intraspinal abscess and granuloma: Secondary | ICD-10-CM | POA: Diagnosis not present

## 2023-03-21 NOTE — Patient Outreach (Signed)
  Care Coordination   Follow Up Visit Note   03/21/2023 Name: Brett Bender MRN: [id number] DOB: 21-Nov-1945  Brett Bender is a 77 y.o. year old male who sees Fisher, Demetrios Isaacs, MD for primary care. I spoke with  Brett Bender by phone today.  What matters to the patients health and wellness today?  Healing from epidural abscess.  Has call out to PCP office to inquire about medication compatibility, waiting for call back.    Goals Addressed             This Visit's Progress    Effective treatment of infection       Care Coordination Interventions: Evaluation of current treatment plan related to epidural abscess and patient's adherence to plan as established by provider Advised patient to look for signs of recurrent infection Reviewed scheduled/upcoming provider appointments including ID tomorrow Discussed plans with patient for ongoing care management follow up and provided patient with direct contact information for care management team Screening for signs and symptoms of depression related to chronic disease state  Assessed social determinant of health barriers         SDOH assessments and interventions completed:  Yes    SDOH Interventions    Flowsheet Row Telephone from 03/09/2023 in [facility] Care Coordination Telephone from 02/16/2023 in [facility] Coord[facility] Clinical Support from 01/04/2023 in [facility] Clinical Support from 12/31/2021 in [facility]  SDOH Interventions      Food Insecurity Interventions Intervention Not Indicated Intervention Not Indicated Intervention Not Indicated Intervention Not Indicated  Housing Interventions Intervention Not Indicated Intervention Not Indicated Intervention Not Indicated Intervention Not Indicated  Transportation Interventions Intervention Not Indicated Intervention Not Indicated Intervention Not Indicated Intervention  Not Indicated  Utilities Interventions -- -- Intervention Not Indicated --  Alcohol Usage Interventions -- -- Intervention Not Indicated (Score <7) --  Financial Strain Interventions -- -- Intervention Not Indicated Intervention Not Indicated  Physical Activity Interventions -- -- Intervention Not Indicated Intervention Not Indicated  Stress Interventions -- -- Intervention Not Indicated Intervention Not Indicated  Social Connections Interventions -- -- Intervention Not Indicated Intervention Not Indicated        Care Coordination Interventions:  Yes, provided   Interventions Today    Flowsheet Row Most Recent Value  Chronic Disease   Chronic disease during today's visit Other  [Epidural abscess]  General Interventions   General Interventions Discussed/Reviewed General Interventions Reviewed, Doctor Visits  Doctor Visits Discussed/Reviewed Doctor Visits Reviewed, PCP, Specialist  [ID tomorrow, cardiology 6/6, actve wtih [facility] for PT/OT]  PCP/Specialist Visits Compliance with follow-up visit  Education Interventions   Education Provided Provided Education  Provided Verbal Education On Other, Medication, When to see the doctor  [Discussed home IV therapy, wife is cleaning PICC line, aware of methods for cleaning and decreasing infection risk. Discussed medications for pain control, confused as labels state do not take Gabapentin while taking Tizanidine and Tramadol.]       Follow up plan: Follow up call scheduled for 6/3    Encounter Outcome:  Pt. Visit Completed   Kemper Durie, RN, MSN, [facility] Care Management Care Management Coordinator [phone]

## 2023-03-21 NOTE — Telephone Encounter (Signed)
Message from Randol Kern sent at 03/21/2023  2:39 PM EDT  Summary: Possible Drug interaction; symptoms   Pt called reporting that his Gabapentin is not helping him, seeking something stronger. His tramadol states that he should not be taking both medicines at the same time.  Pt is not sleeping well and is confused about what he should be taking.          Call History   Type Contact Phone/Fax User  03/21/2023 02:38 PM EDT Phone ([address] Brett Bender, Brett Bender (Self) [phone] Rexene Edison) Randol Kern

## 2023-03-21 NOTE — Telephone Encounter (Signed)
He shouldn't take tramadol at the same time that he takes gabapentin, but he can take a tramadol if he wakes up hurting in the middle of the night.

## 2023-03-21 NOTE — Telephone Encounter (Signed)
Attempted to return his call.  Left a voicemail to call back. 

## 2023-03-21 NOTE — Telephone Encounter (Unsigned)
Chief Complaint: Gabapentin not working well during night Symptoms: Not sleeping due to pain Frequency: Ongoing Pertinent Negatives: Patient denies symptoms Disposition: [] ED /[] Urgent Care (no appt availability in office) / [] Appointment(In office/virtual)/ []  [facility]/ [] Home Care/ [] Refused Recommended Disposition /[] [facility]/ [x]  Follow-up with PCP Additional Notes: Patient says he takes Tramadol at 0800 and 1500, Gabapentin at 2100. He says around 0200, sometimes earlier, the pain starts in the lower back, the gabapentin is not holding the pain all night. He says he's been waking up taking Tylenol, which hasn't been effective, so the past 2 nights he took the muscle relaxer Tizanidine, which didn't help the pain either. He says on the Tramadol bottle it says not to take gabapentin or tizanidine with it, so he's confused as to what he should do when he has the pain. He asks is there something else that could be prescribed for him to take when he has the pain at night. Advised I will send this to Dr. Sherrie Mustache for review and someone will call back with the recommendation. Patient verbalized understanding.  Pharmacy: [facility]    Summary: Possible Drug interaction; symptoms   Pt called reporting that his Gabapentin is not helping him, seeking something stronger. His tramadol states that he should not be taking both medicines at the same time.  Pt is not sleeping well and is confused about what he should be taking.     Reason for Disposition  Prescription request for new medicine (not a refill)  Answer Assessment - Initial Assessment Questions 1. NAME of MEDICINE: "What medicine(s) are you calling about?"     Gabapentin, Tizanidine, Tramadol 2. QUESTION: "What is your question?" (e.g., double dose of medicine, side effect)     Is there something else to take 3. PRESCRIBER: "Who prescribed the medicine?" Reason: if prescribed by specialist, call should be referred  to that group.     Dr. Sherrie Mustache 4. SYMPTOMS: "Do you have any symptoms?" If Yes, ask: "What symptoms are you having?"  "How bad are the symptoms (e.g., mild, moderate, severe)     No  Protocols used: Medication Question Call-A-AH

## 2023-03-22 ENCOUNTER — Encounter: Payer: Self-pay | Admitting: Infectious Diseases

## 2023-03-22 ENCOUNTER — Ambulatory Visit: Payer: Medicare Other | Attending: Infectious Diseases | Admitting: Infectious Diseases

## 2023-03-22 VITALS — BP 122/75 | HR 60 | Temp 96.8°F | Ht 75.5 in | Wt 193.0 lb

## 2023-03-22 DIAGNOSIS — Z981 Arthrodesis status: Secondary | ICD-10-CM | POA: Diagnosis not present

## 2023-03-22 DIAGNOSIS — Z79899 Other long term (current) drug therapy: Secondary | ICD-10-CM | POA: Insufficient documentation

## 2023-03-22 DIAGNOSIS — M4646 Discitis, unspecified, lumbar region: Secondary | ICD-10-CM | POA: Diagnosis not present

## 2023-03-22 DIAGNOSIS — B9561 Methicillin susceptible Staphylococcus aureus infection as the cause of diseases classified elsewhere: Secondary | ICD-10-CM | POA: Insufficient documentation

## 2023-03-22 DIAGNOSIS — N200 Calculus of kidney: Secondary | ICD-10-CM | POA: Insufficient documentation

## 2023-03-22 DIAGNOSIS — R7881 Bacteremia: Secondary | ICD-10-CM | POA: Diagnosis not present

## 2023-03-22 DIAGNOSIS — N400 Enlarged prostate without lower urinary tract symptoms: Secondary | ICD-10-CM | POA: Diagnosis not present

## 2023-03-22 MED ORDER — CEFADROXIL 500 MG PO CAPS: 1000.0000 mg | ORAL_CAPSULE | Freq: Two times a day (BID) | ORAL | 0 refills | Status: DC

## 2023-03-22 NOTE — Progress Notes (Signed)
NAME: Brett Bender  DOB: Oct 05, 1946  MRN: [id number]  Date/Time: 03/22/2023 11:20 AM   Subjective:   ? Brett Bender is a 77 y.o. male is here for follow up after recent hospital discharge when he was treated for MSSA bacteremia Pt was in [facility] 4/1-4/7 for altered mental status for non intentional overdose of pain meds due to severe back pain - he got narcan- but the found to have fever and blood culture was positive for mssa.  This was initially was not clear.  He had wound on his back which cultured positive for MSSA.  He had severe back pain and an MRI of the lumbar spine showed degenerative disc disease but no evidence of discitis osteomyelitis or epidural abscess.  2D echo was okay.  A TEE could not be done as he did not tolerate the procedure.  It was decided not to pursue TEE under general anesthesia.  Patient was discharged home with a PICC line and 4 weeks of IV cefazolin to be given as a continuous infusion and 6 g every day.  The last day supposed to be on 30 April. I saw him on 03/01/2023 when he was complaining of severe lumbar pain. So he we repeated another MRI on 03/02/2023 and that showed L5-S1 discitis and epidural abscess and he was admitted to the hospital again and underwent aspiration of the pus and the culture was negative. He was seen by neurosurgeon who did not think he would need surgery because there was no neurological deficit He was seen by ID on call and cefazolin was continued for another 6 weeks from the day of the aspiration.  His last day would be 04/13/2023 and he will then go on p.o. cefadroxil for a month or so. He is doing better The pain is better than before But he still requires pain medication He is able to walk twice a day  Saw his PCP after discharge.   Follow-up appointment Dr. Marcell Barlow in a month's time. He has a history of lumbar surgery L3-L4 decompression done by Dr. Adriana Simas in 2019.  He had lumbar fusion in 2010.  As well as cervical spine  surgery. Past Medical History:  Diagnosis Date   Actinic keratosis    Arthritis    Basal cell carcinoma 11/09/2013   R lateral edge of clavicle   Basal cell carcinoma 02/03/2016   L lateral supraclavicular base of neck/excision   Basal cell carcinoma 11/29/2018   L upper back   Basal cell carcinoma 09/11/2020   R mid back    Cancer (HCC)    Basal cell carcinoma bilateral arms and left shoulder   Complication of anesthesia    slow to wake up with shoulder surgery   Depression    Enlarged prostate    H/O elbow surgery    left   Herpes simplex    History of kidney stones    History of MRSA infection 2004   skin around eye   Hypertension    Kidney stone    Lumbar stenosis    Migraine headache    Motion sickness    ocean boats   PONV (postoperative nausea and vomiting)    Tendonitis of ankle, left     Past Surgical History:  Procedure Laterality Date   APPENDECTOMY     back injection     BACK SURGERY     CYSTOSCOPY/URETEROSCOPY/HOLMIUM LASER/STENT PLACEMENT Left 12/18/2015   Procedure: CYSTOSCOPY/URETEROSCOPY/HOLMIUM LASER LITHOTRIPSY;  Surgeon: Vanna Scotland, MD;  Location:  [facility];  Service: Urology;  Laterality: Left;   ELBOW SURGERY     IR LUMBAR DISC ASPIRATION W/IMG GUIDE  03/03/2023   LOWER BACK SURGERY  2010   L4-5 fusion   LUMBAR LAMINECTOMY/DECOMPRESSION MICRODISCECTOMY N/A 08/21/2018   Procedure: LUMBAR LAMINECTOMY/DECOMPRESSION MICRODISCECTOMY 1 LEVEL-L3/4;  Surgeon: Lucy Chris, MD;  Location: [facility];  Service: Neurosurgery;  Laterality: N/A;   NECK SURGERY     Upper neck surgery; with wires placed   SHOULDER SURGERY Right 11/10/2011   arthroscopic surgery . Dr. Katrinka Blazing, [facility]   TEE WITHOUT CARDIOVERSION N/A 02/11/2023   Procedure: TRANSESOPHAGEAL ECHOCARDIOGRAM;  Surgeon: Debbe Odea, MD;  Location: [facility];  Service: Cardiovascular;  Laterality: N/A;   TONSILLECTOMY     TRICEPS TENDON REPAIR Left 12/04/2019   Procedure: TRICEPS TENDON REPAIR,  OLECRANON BURSA;  Surgeon: Signa Kell, MD;  Location: [facility];  Service: Orthopedics;  Laterality: Left;    Social History   Socioeconomic History   Marital status: Married    Spouse name: Not on file   Number of children: 2   Years of education: Not on file   Highest education level: Associate degree: occupational, Scientist, product/process development, or vocational program  Occupational History   Occupation: Retired    Comment: former Programmer, systems  Tobacco Use   Smoking status: Never   Smokeless tobacco: Never  Building services engineer Use: Never used  Substance and Sexual Activity   Alcohol use: No    Alcohol/week: 0.0 standard drinks of alcohol   Drug use: No   Sexual activity: Yes  Other Topics Concern   Not on file  Social History Narrative   Not on file   Social Determinants of Health   Financial Resource Strain: Low Risk  (01/04/2023)   Overall Financial Resource Strain (CARDIA)    Difficulty of Paying Living Expenses: Not hard at all  Food Insecurity: No Food Insecurity (03/09/2023)   Hunger Vital Sign    Worried About Running Out of Food in the Last Year: Never true    Ran Out of Food in the Last Year: Never true  Transportation Needs: No Transportation Needs (03/09/2023)   PRAPARE - Administrator, Civil Service (Medical): No    Lack of Transportation (Non-Medical): No  Physical Activity: Sufficiently Active (01/04/2023)   Exercise Vital Sign    Days of Exercise per Week: 5 days    Minutes of Exercise per Session: 90 min  Stress: No Stress Concern Present (01/04/2023)   [employer] of Occupational Health - Occupational Stress Questionnaire    Feeling of Stress : Not at all  Social Connections: Moderately Integrated (01/04/2023)   Social Connection and Isolation Panel [NHANES]    Frequency of Communication with Friends and Family: More than three times a week    Frequency of Social Gatherings with Friends and Family: More than three times a week     Attends Religious Services: More than 4 times per year    Active Member of [employer] or Organizations: No    Attends Banker Meetings: Never    Marital Status: Married  Catering manager Violence: Not At Risk (03/03/2023)   Humiliation, Afraid, Rape, and Kick questionnaire    Fear of Current or Ex-Partner: No    Emotionally Abused: No    Physically Abused: No    Sexually Abused: No    Family History  Problem Relation Age of Onset   Depression Son    Diabetes Neg Hx  Cancer Neg Hx    Heart attack Neg Hx    Kidney disease Neg Hx    Prostate cancer Neg Hx    Kidney cancer Neg Hx    Bladder Cancer Neg Hx    Allergies  Allergen Reactions   Penicillins Other (See Comments)    Stomach Ache   I? Current Outpatient Medications  Medication Sig Dispense Refill   amLODipine (NORVASC) 10 MG tablet TAKE 1 TABLET BY MOUTH ONCE A DAY 90 tablet 0   aspirin 81 MG chewable tablet Chew by mouth daily.     ceFAZolin (ANCEF) IVPB Inject 6 g into the vein continuous. Cefazolin 6gm IV  as continuous infusion over 24hr Indication:  MSSA bacteremia with epidural abscess and discitis First Dose: Yes Last Day of Therapy:  04/13/2023 Labs - Once weekly:  CBC/D, CMP, ESR and CRP Please leave PIC in place until doctor has seen patient or been notified Fax weekly labs to [phone] Method of administration: elastomeric Method of administration may be changed at the discretion of home infusion pharmacist based upon assessment of the patient and/or caregiver's ability to self-administer the medication ordered. 37 Units 0   diclofenac Sodium (VOLTAREN) 1 % GEL APPLY 4G TOPICALLY FIVE TIMES DAILY 100 g 4   fexofenadine (ALLEGRA ALLERGY) 180 MG tablet Take 1 tablet (180 mg total) by mouth daily. 10 tablet 1   finasteride (PROSCAR) 5 MG tablet Take 1 tablet (5 mg total) by mouth daily. 90 tablet 3   gabapentin (NEURONTIN) 300 MG capsule Take 1 capsule (300 mg total) by mouth at bedtime as needed. 30  capsule 0   latanoprost (XALATAN) 0.005 % ophthalmic solution Place 1 drop into both eyes at bedtime.      menthol-cetylpyridinium (CEPACOL) 3 MG lozenge Take 1 lozenge (3 mg total) by mouth as needed for sore throat. 30 tablet 0   mirtazapine (REMERON) 30 MG tablet TAKE 1 TABLET BY MOUTH EVERY NIGHT AT BEDTIME 30 tablet 12   mometasone (ELOCON) 0.1 % cream Apply 1 application. topically daily as needed (Rash). Qd up to 5 days a week to itchy bumps on body 45 g 1   Multiple Vitamin (MULTIVITAMIN) tablet Take 1 tablet by mouth daily.     Probiotic Product (DIGESTIVE ADVANTAGE PO) Take 1 tablet by mouth daily.     psyllium (METAMUCIL) 58.6 % powder Take 1 packet by mouth daily as needed (regularity).      rosuvastatin (CRESTOR) 20 MG tablet Take 1 tablet (20 mg total) by mouth daily. 90 tablet 3   tamsulosin (FLOMAX) 0.4 MG CAPS capsule Take 1 capsule (0.4 mg total) by mouth daily. 90 capsule 3   timolol (TIMOPTIC) 0.5 % ophthalmic solution Place 1 drop into both eyes daily.      tiZANidine (ZANAFLEX) 4 MG tablet TAKE ONE TABLET (4 MG TOTAL) BY MOUTH EVERY EIGHT (EIGHT) HOURS AS NEEDED FOR MUSCLE SPASMS. 20 tablet 0   traMADol (ULTRAM) 50 MG tablet Take 1 tablet (50 mg total) by mouth every 8 (eight) hours as needed. Do not take with gabapentin or tizanidine 30 tablet 1   No current facility-administered medications for this visit.     Abtx:  Anti-infectives (From admission, onward)    None       REVIEW OF SYSTEMS:  Const: negative fever, negative chills, negative weight loss Eyes: negative diplopia or visual changes, negative eye pain ENT: negative coryza, negative sore throat Resp: negative cough, hemoptysis, dyspnea Cards: negative for chest pain, palpitations,  lower extremity edema GU: negative for frequency, dysuria and hematuria GI: Negative for abdominal pain, diarrhea, bleeding, constipation Skin: negative for rash and pruritus Heme: negative for easy bruising and gum/nose  bleeding MS: Back pain  Neurolo:negative for headaches, dizziness, vertigo, memory problems  Psych: negative for feelings of anxiety, depression  Endocrine: negative for thyroid, diabetes Allergy/Immunology-penicillin: Objective:  VITALS:  BP 122/75   Pulse 60   Temp (!) 96.8 F (36 C) (Temporal)   Ht 6' 3.5" (1.918 m)   Wt 193 lb (87.5 kg)   SpO2 93%   BMI 23.80 kg/m  LDA PICC line site clean   PHYSICAL EXAM:  General: Alert, cooperative, no distress at rest.  Able to walk Head: Normocephalic, without obvious abnormality, atraumatic. Eyes: Conjunctivae clear, anicteric sclerae. Pupils are equal ENT Nares normal. No drainage or sinus tenderness. Lips, mucosa, and tongue normal. No Thrush Neck: Supple, symmetrical, no adenopathy, thyroid: non tender no carotid bruit and no JVD. Back: No CVA tenderness. Lungs: Clear to auscultation bilaterally. No Wheezing or Rhonchi. No rales. Heart: Regular rate and rhythm, no murmur, rub or gallop. Abdomen: Soft  extremities: atraumatic, no cyanosis. No edema. No clubbing Skin: No rashes or lesions. Or bruising Lymph: Cervical, supraclavicular normal. Neurologic: Not assessed, nonfocal   ? Impression/Recommendation   Staph aureus bacteremia.  With lumbar discitis l5-s1 and epidural abscess Aspiration of the abscess was negative for staph aureus  TEE could not be done due to desaturation, but now that he has vertebral infection TEE is not really needed as it would not change the management Will complete IV cefazolin on 04/13/2023 Then he will go on cefadroxil 1 g p.o. every 12 for another month. Is getting weekly labs.  Will review his latest results Once he goes on p.o. antibiotic will get labs i 2 weeks after that. Marland Kitchen       BPH on tamsulosin and finasteride.  Had urinary retention while in the hospital and had a Foley for a few days but this was removed on discharge    History of lumbar spine fusion   B/l renal stones   nonobstructive. Discussed the management with the patient and his wife.

## 2023-03-22 NOTE — Patient Instructions (Addendum)
You are here fro follow up of spine infection with staph- you will complete IV cefazolin on 04/13/23. After that will start cefadroxil 1 gram PO BID for 30 days. Labs on 04/28/23

## 2023-03-22 NOTE — Telephone Encounter (Signed)
Advised 

## 2023-03-23 DIAGNOSIS — Z452 Encounter for adjustment and management of vascular access device: Secondary | ICD-10-CM | POA: Diagnosis not present

## 2023-03-23 DIAGNOSIS — M4646 Discitis, unspecified, lumbar region: Secondary | ICD-10-CM | POA: Diagnosis not present

## 2023-03-23 DIAGNOSIS — A4101 Sepsis due to Methicillin susceptible Staphylococcus aureus: Secondary | ICD-10-CM | POA: Diagnosis not present

## 2023-03-23 DIAGNOSIS — M869 Osteomyelitis, unspecified: Secondary | ICD-10-CM | POA: Diagnosis not present

## 2023-03-23 DIAGNOSIS — G061 Intraspinal abscess and granuloma: Secondary | ICD-10-CM | POA: Diagnosis not present

## 2023-03-23 DIAGNOSIS — I10 Essential (primary) hypertension: Secondary | ICD-10-CM | POA: Diagnosis not present

## 2023-03-24 ENCOUNTER — Telehealth: Payer: Self-pay

## 2023-03-24 NOTE — Telephone Encounter (Signed)
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

## 2023-03-24 NOTE — Telephone Encounter (Signed)
Called pt discussed biopsy results, scheduled surgery appt  

## 2023-03-25 ENCOUNTER — Other Ambulatory Visit: Payer: Self-pay | Admitting: Family Medicine

## 2023-03-25 DIAGNOSIS — M5416 Radiculopathy, lumbar region: Secondary | ICD-10-CM

## 2023-03-25 MED ORDER — GABAPENTIN 300 MG PO CAPS: 300.0000 mg | ORAL_CAPSULE | Freq: Every evening | ORAL | 1 refills | Status: DC | PRN

## 2023-03-26 ENCOUNTER — Encounter: Payer: Self-pay | Admitting: Dermatology

## 2023-03-28 ENCOUNTER — Other Ambulatory Visit: Payer: Self-pay | Admitting: Family Medicine

## 2023-03-28 DIAGNOSIS — I10 Essential (primary) hypertension: Secondary | ICD-10-CM

## 2023-03-29 DIAGNOSIS — M4646 Discitis, unspecified, lumbar region: Secondary | ICD-10-CM | POA: Diagnosis not present

## 2023-03-29 DIAGNOSIS — M869 Osteomyelitis, unspecified: Secondary | ICD-10-CM | POA: Diagnosis not present

## 2023-03-29 DIAGNOSIS — I10 Essential (primary) hypertension: Secondary | ICD-10-CM | POA: Diagnosis not present

## 2023-03-29 DIAGNOSIS — A4101 Sepsis due to Methicillin susceptible Staphylococcus aureus: Secondary | ICD-10-CM | POA: Diagnosis not present

## 2023-03-29 DIAGNOSIS — G061 Intraspinal abscess and granuloma: Secondary | ICD-10-CM | POA: Diagnosis not present

## 2023-03-29 DIAGNOSIS — Z452 Encounter for adjustment and management of vascular access device: Secondary | ICD-10-CM | POA: Diagnosis not present

## 2023-03-29 NOTE — Telephone Encounter (Signed)
Requested Prescriptions  Pending Prescriptions Disp Refills   amLODipine (NORVASC) 10 MG tablet [Pharmacy Med Name: AMLODIPINE BESYLATE 10MG  TABLET] 90 tablet 1    Sig: TAKE ONE TABLET BY MOUTH ONCE A DAY     Cardiovascular: Calcium Channel Blockers 2 Passed - 03/28/2023  9:03 AM      Passed - Last BP in normal range    BP Readings from Last 1 Encounters:  03/22/23 122/75         Passed - Last Heart Rate in normal range    Pulse Readings from Last 1 Encounters:  03/22/23 60         Passed - Valid encounter within last 6 months    Recent Outpatient Visits           1 week ago Lumbar radiculopathy   [facility] Malva Limes, MD   3 weeks ago Lumbar radiculopathy   [facility] [name], PA-C   1 month ago Hospital discharge follow-up   [facility] Merita Norton T, FNP   1 month ago Lumbar radiculopathy   [facility] Malva Limes, MD   1 month ago Acute right-sided low back pain without sciatica   [facility] Malva Limes, MD       Future Appointments             In 2 weeks Agbor-Etang, Arlys John, MD [facility] [place]   In 1 month Fisher, Demetrios Isaacs, MD [facility]   In 2 months McGowan, Elana Alm [facility] [place]   In 5 months Deirdre Evener, MD [facility]

## 2023-03-30 DIAGNOSIS — M869 Osteomyelitis, unspecified: Secondary | ICD-10-CM | POA: Diagnosis not present

## 2023-03-30 DIAGNOSIS — M4646 Discitis, unspecified, lumbar region: Secondary | ICD-10-CM | POA: Diagnosis not present

## 2023-03-30 DIAGNOSIS — I10 Essential (primary) hypertension: Secondary | ICD-10-CM | POA: Diagnosis not present

## 2023-03-30 DIAGNOSIS — Z452 Encounter for adjustment and management of vascular access device: Secondary | ICD-10-CM | POA: Diagnosis not present

## 2023-03-30 DIAGNOSIS — G061 Intraspinal abscess and granuloma: Secondary | ICD-10-CM | POA: Diagnosis not present

## 2023-03-30 DIAGNOSIS — A4101 Sepsis due to Methicillin susceptible Staphylococcus aureus: Secondary | ICD-10-CM | POA: Diagnosis not present

## 2023-03-31 ENCOUNTER — Telehealth: Payer: Self-pay

## 2023-03-31 NOTE — Telephone Encounter (Signed)
Received voicemail from [place] with [facility] requesting pull PICC orders. Called Tina back, no answer. Left HIPAA compliant voicemail requesting callback.   Per Dr. Rivka Safer, okay to pull PICC after last dose on 04/13/23.  Tina: [phone]  Sandie Ano, RN

## 2023-04-01 NOTE — Telephone Encounter (Signed)
Spoke with Brett Bender, relayed verbal orders per Dr. Rivka Safer that okay to pull PICC after last dose on 04/13/23.   Sandie Ano, RN

## 2023-04-05 ENCOUNTER — Other Ambulatory Visit: Payer: Self-pay | Admitting: Urology

## 2023-04-05 DIAGNOSIS — A4101 Sepsis due to Methicillin susceptible Staphylococcus aureus: Secondary | ICD-10-CM | POA: Diagnosis not present

## 2023-04-05 DIAGNOSIS — M4646 Discitis, unspecified, lumbar region: Secondary | ICD-10-CM | POA: Diagnosis not present

## 2023-04-05 DIAGNOSIS — N138 Other obstructive and reflux uropathy: Secondary | ICD-10-CM

## 2023-04-05 DIAGNOSIS — I10 Essential (primary) hypertension: Secondary | ICD-10-CM | POA: Diagnosis not present

## 2023-04-05 DIAGNOSIS — G061 Intraspinal abscess and granuloma: Secondary | ICD-10-CM | POA: Diagnosis not present

## 2023-04-05 DIAGNOSIS — Z452 Encounter for adjustment and management of vascular access device: Secondary | ICD-10-CM | POA: Diagnosis not present

## 2023-04-05 DIAGNOSIS — G9341 Metabolic encephalopathy: Secondary | ICD-10-CM | POA: Diagnosis not present

## 2023-04-05 DIAGNOSIS — M869 Osteomyelitis, unspecified: Secondary | ICD-10-CM | POA: Diagnosis not present

## 2023-04-06 DIAGNOSIS — A4101 Sepsis due to Methicillin susceptible Staphylococcus aureus: Secondary | ICD-10-CM | POA: Diagnosis not present

## 2023-04-06 DIAGNOSIS — G061 Intraspinal abscess and granuloma: Secondary | ICD-10-CM | POA: Diagnosis not present

## 2023-04-06 DIAGNOSIS — I10 Essential (primary) hypertension: Secondary | ICD-10-CM | POA: Diagnosis not present

## 2023-04-06 DIAGNOSIS — Z452 Encounter for adjustment and management of vascular access device: Secondary | ICD-10-CM | POA: Diagnosis not present

## 2023-04-06 DIAGNOSIS — M4646 Discitis, unspecified, lumbar region: Secondary | ICD-10-CM | POA: Diagnosis not present

## 2023-04-06 DIAGNOSIS — M869 Osteomyelitis, unspecified: Secondary | ICD-10-CM | POA: Diagnosis not present

## 2023-04-11 ENCOUNTER — Ambulatory Visit: Payer: Self-pay | Admitting: *Deleted

## 2023-04-11 DIAGNOSIS — G061 Intraspinal abscess and granuloma: Secondary | ICD-10-CM | POA: Diagnosis not present

## 2023-04-11 DIAGNOSIS — A4101 Sepsis due to Methicillin susceptible Staphylococcus aureus: Secondary | ICD-10-CM | POA: Diagnosis not present

## 2023-04-11 DIAGNOSIS — I10 Essential (primary) hypertension: Secondary | ICD-10-CM | POA: Diagnosis not present

## 2023-04-11 DIAGNOSIS — Z452 Encounter for adjustment and management of vascular access device: Secondary | ICD-10-CM | POA: Diagnosis not present

## 2023-04-11 DIAGNOSIS — M869 Osteomyelitis, unspecified: Secondary | ICD-10-CM | POA: Diagnosis not present

## 2023-04-11 DIAGNOSIS — M4646 Discitis, unspecified, lumbar region: Secondary | ICD-10-CM | POA: Diagnosis not present

## 2023-04-11 NOTE — Patient Outreach (Signed)
  Care Coordination   Follow Up Visit Note   04/11/2023 Name: Brett Bender MRN: [id number] DOB: 1946-04-28  Brett Bender is a 77 y.o. year old male who sees Fisher, Demetrios Isaacs, MD for primary care. I spoke with  Brett Bender by phone today.  What matters to the patients health and wellness today?  Complete IV antibiotics and have PICC line removed.    Goals Addressed             This Visit's Progress    Effective treatment of infection   On track    Care Coordination Interventions: Evaluation of current treatment plan related to epidural abscess and patient's adherence to plan as established by provider Advised patient to look for signs of recurrent infection Discussed plans with patient for ongoing care management follow up and provided patient with direct contact information for care management team         SDOH assessments and interventions completed:  No     Care Coordination Interventions:  Yes, provided   Interventions Today    Flowsheet Row Most Recent Value  Chronic Disease   Chronic disease during today's visit Other  [Epidural Abscess]  General Interventions   General Interventions Discussed/Reviewed General Interventions Reviewed, Doctor Visits, Durable Medical Equipment (DME)  [Has follow up with cardiology on 6/6 and neurosurgery on 6/11.  He will have last day of IV antibiotics on 6/5, PICC line will be removed at that time.]  Doctor Visits Discussed/Reviewed Doctor Visits Reviewed, Specialist  Durable Medical Equipment (DME) Dan Humphreys, Other  [Continues to use walker/cane when needed, but looking forward to going to the gym more to increase strength.  PT sessions has completed.]  PCP/Specialist Visits Compliance with follow-up visit       Follow up plan: Follow up call scheduled for 7/5    Encounter Outcome:  Pt. Visit Completed   Kemper Durie, RN, MSN, [facility] Care Management Care Management Coordinator [phone]

## 2023-04-13 DIAGNOSIS — Z452 Encounter for adjustment and management of vascular access device: Secondary | ICD-10-CM | POA: Diagnosis not present

## 2023-04-13 DIAGNOSIS — M869 Osteomyelitis, unspecified: Secondary | ICD-10-CM | POA: Diagnosis not present

## 2023-04-13 DIAGNOSIS — M4646 Discitis, unspecified, lumbar region: Secondary | ICD-10-CM | POA: Diagnosis not present

## 2023-04-13 DIAGNOSIS — I10 Essential (primary) hypertension: Secondary | ICD-10-CM | POA: Diagnosis not present

## 2023-04-13 DIAGNOSIS — G061 Intraspinal abscess and granuloma: Secondary | ICD-10-CM | POA: Diagnosis not present

## 2023-04-13 DIAGNOSIS — A4101 Sepsis due to Methicillin susceptible Staphylococcus aureus: Secondary | ICD-10-CM | POA: Diagnosis not present

## 2023-04-14 ENCOUNTER — Ambulatory Visit: Payer: Medicare Other | Attending: Cardiology | Admitting: Cardiology

## 2023-04-14 ENCOUNTER — Telehealth: Payer: Self-pay

## 2023-04-14 ENCOUNTER — Encounter: Payer: Self-pay | Admitting: Cardiology

## 2023-04-14 VITALS — BP 118/76 | HR 57 | Ht 75.5 in | Wt 196.4 lb

## 2023-04-14 DIAGNOSIS — B9561 Methicillin susceptible Staphylococcus aureus infection as the cause of diseases classified elsewhere: Secondary | ICD-10-CM | POA: Diagnosis not present

## 2023-04-14 DIAGNOSIS — I10 Essential (primary) hypertension: Secondary | ICD-10-CM | POA: Diagnosis not present

## 2023-04-14 DIAGNOSIS — R7881 Bacteremia: Secondary | ICD-10-CM | POA: Diagnosis not present

## 2023-04-14 NOTE — Progress Notes (Signed)
Cardiology Office Note:    Date:  04/14/2023   ID:  Brett Bender, DOB [birth year], MRN [id number]  PCP:  Malva Limes, MD   [facility] Providers Cardiologist:  None     Referring MD: Jacky Kindle, FNP   Chief Complaint  Patient presents with   New Patient (Initial Visit)    2 recent hospital admissions for bacterial infection in blood that spread to his bones.  Advised during 1st hospitalization to follow up with cardiology for TEE then follow up with ID to finalize abx duration.    History of Present Illness:    Brett Bender is a 77 y.o. male with a hx of hypertension, hyperlipidemia who presents due to recent hospital admission for bacteremia.  Patient admitted 2 months ago with MSSA bacteremia.  He was being treated with antibiotics when he presented couple of weeks later with back pain, found to have acute osteomyelitis L5-S1 with epidural abscess.  Underwent aspiration.  Transthoracic echo 02/09/2023 showed normal EF 60 to 65%, no evidence for endocarditis.  TEE not performed in the hospital due to risk.  Patient followed up with infectious disease as outpatient, initially had a PICC line, this was removed, currently on oral antibiotics.  Per outpatient ID note, TEE not needed as management will not change.  Past Medical History:  Diagnosis Date   Actinic keratosis    Arthritis    Basal cell carcinoma 11/09/2013   R lateral edge of clavicle   Basal cell carcinoma 02/03/2016   L lateral supraclavicular base of neck/excision   Basal cell carcinoma 11/29/2018   L upper back   Basal cell carcinoma 09/11/2020   R mid back    Cancer (HCC)    Basal cell carcinoma bilateral arms and left shoulder   Complication of anesthesia    slow to wake up with shoulder surgery   Depression    Enlarged prostate    H/O elbow surgery    left   Herpes simplex    History of kidney stones    History of MRSA infection 2004   skin around eye   Hypertension    Kidney  stone    Lumbar stenosis    Migraine headache    Motion sickness    ocean boats   PONV (postoperative nausea and vomiting)    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   right inferior post auricular, need surgery   Tendonitis of ankle, left     Past Surgical History:  Procedure Laterality Date   APPENDECTOMY     back injection     BACK SURGERY     CYSTOSCOPY/URETEROSCOPY/HOLMIUM LASER/STENT PLACEMENT Left 12/18/2015   Procedure: CYSTOSCOPY/URETEROSCOPY/HOLMIUM LASER LITHOTRIPSY;  Surgeon: Vanna Scotland, MD;  Location: [facility];  Service: Urology;  Laterality: Left;   ELBOW SURGERY     IR LUMBAR DISC ASPIRATION W/IMG GUIDE  03/03/2023   LOWER BACK SURGERY  2010   L4-5 fusion   LUMBAR LAMINECTOMY/DECOMPRESSION MICRODISCECTOMY N/A 08/21/2018   Procedure: LUMBAR LAMINECTOMY/DECOMPRESSION MICRODISCECTOMY 1 LEVEL-L3/4;  Surgeon: Lucy Chris, MD;  Location: [facility];  Service: Neurosurgery;  Laterality: N/A;   NECK SURGERY     Upper neck surgery; with wires placed   SHOULDER SURGERY Right 11/10/2011   arthroscopic surgery . Dr. Katrinka Blazing, [facility]   TEE WITHOUT CARDIOVERSION N/A 02/11/2023   Procedure: TRANSESOPHAGEAL ECHOCARDIOGRAM;  Surgeon: Debbe Odea, MD;  Location: [facility];  Service: Cardiovascular;  Laterality: N/A;   TONSILLECTOMY     TRICEPS  TENDON REPAIR Left 12/04/2019   Procedure: TRICEPS TENDON REPAIR, OLECRANON BURSA;  Surgeon: Signa Kell, MD;  Location: [facility];  Service: Orthopedics;  Laterality: Left;    Current Medications: Current Meds  Medication Sig   amLODipine (NORVASC) 10 MG tablet TAKE ONE TABLET BY MOUTH ONCE A DAY   aspirin 81 MG chewable tablet Chew by mouth daily.   cefadroxil (DURICEF) 500 MG capsule Take 2 capsules (1,000 mg total) by mouth 2 (two) times daily.   diclofenac Sodium (VOLTAREN) 1 % GEL APPLY 4G TOPICALLY FIVE TIMES DAILY   fexofenadine (ALLEGRA ALLERGY) 180 MG tablet Take 1 tablet (180 mg total) by mouth daily.   finasteride  (PROSCAR) 5 MG tablet TAKE ONE TABLET BY MOUTH ONCE A DAY   gabapentin (NEURONTIN) 300 MG capsule Take 1 capsule (300 mg total) by mouth at bedtime as needed.   latanoprost (XALATAN) 0.005 % ophthalmic solution Place 1 drop into both eyes at bedtime.    menthol-cetylpyridinium (CEPACOL) 3 MG lozenge Take 1 lozenge (3 mg total) by mouth as needed for sore throat.   mirtazapine (REMERON) 30 MG tablet TAKE 1 TABLET BY MOUTH EVERY NIGHT AT BEDTIME   mometasone (ELOCON) 0.1 % cream Apply 1 application. topically daily as needed (Rash). Qd up to 5 days a week to itchy bumps on body   Multiple Vitamin (MULTIVITAMIN) tablet Take 1 tablet by mouth daily.   Probiotic Product (DIGESTIVE ADVANTAGE PO) Take 1 tablet by mouth daily.   psyllium (METAMUCIL) 58.6 % powder Take 1 packet by mouth daily as needed (regularity).    rosuvastatin (CRESTOR) 20 MG tablet Take 1 tablet (20 mg total) by mouth daily.   tamsulosin (FLOMAX) 0.4 MG CAPS capsule Take 1 capsule (0.4 mg total) by mouth daily.   timolol (TIMOPTIC) 0.5 % ophthalmic solution Place 1 drop into both eyes daily.    tiZANidine (ZANAFLEX) 4 MG tablet TAKE ONE TABLET (4 MG TOTAL) BY MOUTH EVERY EIGHT (EIGHT) HOURS AS NEEDED FOR MUSCLE SPASMS.   traMADol (ULTRAM) 50 MG tablet Take 1 tablet (50 mg total) by mouth every 8 (eight) hours as needed. Do not take with gabapentin or tizanidine     Allergies:   Penicillins   Social History   Socioeconomic History   Marital status: Married    Spouse name: Not on file   Number of children: 2   Years of education: Not on file   Highest education level: Associate degree: occupational, Scientist, product/process development, or vocational program  Occupational History   Occupation: Retired    Comment: former Programmer, systems  Tobacco Use   Smoking status: Never   Smokeless tobacco: Never  Building services engineer Use: Never used  Substance and Sexual Activity   Alcohol use: No    Alcohol/week: 0.0 standard drinks of alcohol   Drug  use: No   Sexual activity: Yes  Other Topics Concern   Not on file  Social History Narrative   Not on file   Social Determinants of Health   Financial Resource Strain: Low Risk  (01/04/2023)   Overall Financial Resource Strain (CARDIA)    Difficulty of Paying Living Expenses: Not hard at all  Food Insecurity: No Food Insecurity (03/09/2023)   Hunger Vital Sign    Worried About Running Out of Food in the Last Year: Never true    Ran Out of Food in the Last Year: Never true  Transportation Needs: No Transportation Needs (03/09/2023)   PRAPARE - Transportation  Lack of Transportation (Medical): No    Lack of Transportation (Non-Medical): No  Physical Activity: Sufficiently Active (01/04/2023)   Exercise Vital Sign    Days of Exercise per Week: 5 days    Minutes of Exercise per Session: 90 min  Stress: No Stress Concern Present (01/04/2023)   [employer] of Occupational Health - Occupational Stress Questionnaire    Feeling of Stress : Not at all  Social Connections: Moderately Integrated (01/04/2023)   Social Connection and Isolation Panel [NHANES]    Frequency of Communication with Friends and Family: More than three times a week    Frequency of Social Gatherings with Friends and Family: More than three times a week    Attends Religious Services: More than 4 times per year    Active Member of [employer] or Organizations: No    Attends Banker Meetings: Never    Marital Status: Married     Family History: The patient's family history includes Depression in his son. There is no history of Diabetes, Cancer, Heart attack, Kidney disease, Prostate cancer, Kidney cancer, or Bladder Cancer.  ROS:   Please see the history of present illness.     All other systems reviewed and are negative.  EKGs/Labs/Other Studies Reviewed:    The following studies were reviewed today:   EKG:  EKG is  ordered today.  The ekg ordered today demonstrates sinus bradycardia, occasional PVCs,  heart rate 57.  Recent Labs: 02/23/2023: TSH 1.330 03/03/2023: ALT 6 03/08/2023: BUN 16; Creatinine, Ser 0.79; Hemoglobin 12.1; Platelets 284; Potassium 3.7; Sodium 135  Recent Lipid Panel    Component Value Date/Time   CHOL 164 02/23/2023 1103   TRIG 120 02/23/2023 1103   HDL 42 02/23/2023 1103   CHOLHDL 3.9 02/23/2023 1103   LDLCALC 100 (H) 02/23/2023 1103     Risk Assessment/Calculations:             Physical Exam:    VS:  BP 118/76 (BP Location: Right Arm, Patient Position: Sitting, Cuff Size: Normal)   Pulse (!) 57   Ht 6' 3.5" (1.918 m)   Wt 196 lb 6.4 oz (89.1 kg)   SpO2 96%   BMI 24.22 kg/m     Wt Readings from Last 3 Encounters:  04/14/23 196 lb 6.4 oz (89.1 kg)  03/22/23 193 lb (87.5 kg)  03/16/23 193 lb (87.5 kg)     GEN:  Well nourished, well developed in no acute distress HEENT: Normal NECK: No JVD; No carotid bruits CARDIAC: RRR, no murmurs, rubs, gallops RESPIRATORY:  Clear to auscultation without rales, wheezing or rhonchi  ABDOMEN: Soft, non-tender, non-distended MUSCULOSKELETAL:  No edema; No deformity  SKIN: Warm and dry NEUROLOGIC:  Alert and oriented x 3 PSYCHIATRIC:  Normal affect   ASSESSMENT:    1. MSSA bacteremia   2. Primary hypertension    PLAN:    In order of problems listed above:  Bacteremia, on antibiotics, transthoracic echo with normal EF, no evidence for endocarditis.  Appears to be improving clinically, IV antibiotics switched to p.o.  TEE not currently needed.  Consider TEE if patient becomes bacteremic despite antibiotics.  Hypertension, BP controlled, continue Norvasc 10 mg.  Follow-up as needed      Medication Adjustments/Labs and Tests Ordered: Current medicines are reviewed at length with the patient today.  Concerns regarding medicines are outlined above.  Orders Placed This Encounter  Procedures   EKG 12-Lead   No orders of the defined types were placed in this  encounter.   Patient Instructions   Medication Instructions:   Your physician recommends that you continue on your current medications as directed. Please refer to the Current Medication list given to you today.  *If you need a refill on your cardiac medications before your next appointment, please call your pharmacy*   Lab Work:  None Ordered  If you have labs (blood work) drawn today and your tests are completely normal, you will receive your results only by: MyChart Message (if you have MyChart) OR A paper copy in the mail If you have any lab test that is abnormal or we need to change your treatment, we will call you to review the results.   Testing/Procedures:  None Ordered   Follow-Up: At [facility], you and your health needs are our priority.  As part of our continuing mission to provide you with exceptional heart care, we have created designated Provider Care Teams.  These Care Teams include your primary Cardiologist (physician) and Advanced Practice Providers (APPs -  Physician Assistants and Nurse Practitioners) who all work together to provide you with the care you need, when you need it.  We recommend signing up for the patient portal called "MyChart".  Sign up information is provided on this After Visit Summary.  MyChart is used to connect with patients for Virtual Visits (Telemedicine).  Patients are able to view lab/test results, encounter notes, upcoming appointments, etc.  Non-urgent messages can be sent to your provider as well.   To learn more about what you can do with MyChart, go to [email or web address].    Your next appointment:    AS NEEDED    Signed, Debbe Odea, MD  04/14/2023 12:14 PM     [facility]

## 2023-04-14 NOTE — Patient Instructions (Addendum)
Medication Instructions:   Your physician recommends that you continue on your current medications as directed. Please refer to the Current Medication list given to you today.  *If you need a refill on your cardiac medications before your next appointment, please call your pharmacy*   Lab Work:  None Ordered  If you have labs (blood work) drawn today and your tests are completely normal, you will receive your results only by: MyChart Message (if you have MyChart) OR A paper copy in the mail If you have any lab test that is abnormal or we need to change your treatment, we will call you to review the results.   Testing/Procedures:  None Ordered   Follow-Up: At [facility], you and your health needs are our priority.  As part of our continuing mission to provide you with exceptional heart care, we have created designated Provider Care Teams.  These Care Teams include your primary Cardiologist (physician) and Advanced Practice Providers (APPs -  Physician Assistants and Nurse Practitioners) who all work together to provide you with the care you need, when you need it.  We recommend signing up for the patient portal called "MyChart".  Sign up information is provided on this After Visit Summary.  MyChart is used to connect with patients for Virtual Visits (Telemedicine).  Patients are able to view lab/test results, encounter notes, upcoming appointments, etc.  Non-urgent messages can be sent to your provider as well.   To learn more about what you can do with MyChart, go to [email or web address].    Your next appointment:  AS NEEDED 

## 2023-04-14 NOTE — Telephone Encounter (Signed)
Patient came into today regarding 2:30 surgery Tuesday. Patient is not on any blood thinners. He was placed in the hospital back in April for a bacterial infection in his back and currently on blood pressure medication, Tramadol and Cephalexin.

## 2023-04-19 ENCOUNTER — Ambulatory Visit (INDEPENDENT_AMBULATORY_CARE_PROVIDER_SITE_OTHER): Payer: Medicare Other | Admitting: Neurosurgery

## 2023-04-19 ENCOUNTER — Encounter: Payer: Self-pay | Admitting: Dermatology

## 2023-04-19 ENCOUNTER — Encounter: Payer: Self-pay | Admitting: Neurosurgery

## 2023-04-19 ENCOUNTER — Ambulatory Visit (INDEPENDENT_AMBULATORY_CARE_PROVIDER_SITE_OTHER): Payer: Medicare Other | Admitting: Dermatology

## 2023-04-19 VITALS — BP 115/70 | Ht 75.0 in | Wt 196.0 lb

## 2023-04-19 VITALS — BP 158/80

## 2023-04-19 DIAGNOSIS — M4646 Discitis, unspecified, lumbar region: Secondary | ICD-10-CM | POA: Diagnosis not present

## 2023-04-19 DIAGNOSIS — C44212 Basal cell carcinoma of skin of right ear and external auricular canal: Secondary | ICD-10-CM | POA: Diagnosis not present

## 2023-04-19 DIAGNOSIS — M47817 Spondylosis without myelopathy or radiculopathy, lumbosacral region: Secondary | ICD-10-CM

## 2023-04-19 DIAGNOSIS — C4441 Basal cell carcinoma of skin of scalp and neck: Secondary | ICD-10-CM | POA: Diagnosis not present

## 2023-04-19 DIAGNOSIS — M4727 Other spondylosis with radiculopathy, lumbosacral region: Secondary | ICD-10-CM

## 2023-04-19 DIAGNOSIS — M5416 Radiculopathy, lumbar region: Secondary | ICD-10-CM

## 2023-04-19 MED ORDER — MUPIROCIN 2 % EX OINT
1.0000 | TOPICAL_OINTMENT | Freq: Every day | CUTANEOUS | 0 refills | Status: DC
Start: 2023-04-19 — End: 2023-09-15

## 2023-04-19 NOTE — Progress Notes (Signed)
Referring Physician:  Lynn Ito, MD [address] [place],  [place] [address]  Primary Physician:  Malva Limes, MD  History of Present Illness: 04/19/23 [name] is a 77 year old presenting today for follow-up regarding low back and right hip pain.  Since his last visit he presented to the ER on 03/02/2023 for worsening back pain after an MRI which showed epidural abscess.  He was encouraged by infectious disease to present to the ER.  At that time he was having difficulty with urination and worsening pain upon standing and walking.  Neurosurgery was consulted and recommended admission to medicine for further workup, lab results, and broad-spectrum antibiotics with ID consultation.  He ultimately underwent drainage of epidural abscess by IR and his antibiotics were de-escalated to cefazolin.  The patient had his Foley catheter removal in patient and was able to pass a trial of void.  He was discharged on 03/08/2023.  It was recommended that he complete a 6-week course of antibiotics. He followed up with infectious disease on 03/22/2023 who recommended that he continue IV cefazolin until 04/13/2023 and then complete a 4-week course of PO Cefadroxil which which he has transitioned to at this time.  Today he does report some ongoing low back pain that radiates into his right buttock and lateral thigh.  He states that this has improved some since his last visit however.  He has returned to the gym.   02/22/2023 [name] is here today for hospital follow-up with a chief complaint of low back pain that radiates into he right hip, but no pain down the legs.  He was initially seen in the ER on 02/07/2023 for an acute onset of right-sided flank pain that radiated into his right hip.  He was found to have sepsis and started on antibiotics for a staph infection.  He was also found to have an AKI. Today he reports significant improvement of his back pain however he does continue  to have some pain to his back and pain that radiates into his right buttock.  He denies any pain that radiates down his legs.  He denies any numbness or tingling in the legs.   He is currently working with home physical therapy and feels this is going well. Conservative measures:  Physical therapy: has not participated Multimodal medical therapy including regular antiinflammatories:  tizanidine, norco,  prednisone, tramadol  Injections: has not received epidural steroid injections  Past Surgery: L3-4 decompression 09/19/18 by Dr Adriana Simas Lumbar fusion in 2010 Neck surgery over 10+ years ago  Brett Bender has no symptoms of cervical myelopathy.  The symptoms are causing a significant impact on the patient's life.   Review of Systems:  A 10 point review of systems is negative, except for the pertinent positives and negatives detailed in the HPI.  Past Medical History: Past Medical History:  Diagnosis Date   Actinic keratosis    Arthritis    Basal cell carcinoma 11/09/2013   R lateral edge of clavicle   Basal cell carcinoma 02/03/2016   L lateral supraclavicular base of neck/excision   Basal cell carcinoma 11/29/2018   L upper back   Basal cell carcinoma 09/11/2020   R mid back    Cancer (HCC)    Basal cell carcinoma bilateral arms and left shoulder   Complication of anesthesia    slow to wake up with shoulder surgery   Depression    Enlarged prostate    H/O elbow surgery  left   Herpes simplex    History of kidney stones    History of MRSA infection 2004   skin around eye   Hypertension    Kidney stone    Lumbar stenosis    Migraine headache    Motion sickness    ocean boats   PONV (postoperative nausea and vomiting)    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   right inferior post auricular, need surgery   Tendonitis of ankle, left     Past Surgical History: Past Surgical History:  Procedure Laterality Date   APPENDECTOMY     back injection     BACK  SURGERY     CYSTOSCOPY/URETEROSCOPY/HOLMIUM LASER/STENT PLACEMENT Left 12/18/2015   Procedure: CYSTOSCOPY/URETEROSCOPY/HOLMIUM LASER LITHOTRIPSY;  Surgeon: Vanna Scotland, MD;  Location: [facility];  Service: Urology;  Laterality: Left;   ELBOW SURGERY     IR LUMBAR DISC ASPIRATION W/IMG GUIDE  03/03/2023   LOWER BACK SURGERY  2010   L4-5 fusion   LUMBAR LAMINECTOMY/DECOMPRESSION MICRODISCECTOMY N/A 08/21/2018   Procedure: LUMBAR LAMINECTOMY/DECOMPRESSION MICRODISCECTOMY 1 LEVEL-L3/4;  Surgeon: Lucy Chris, MD;  Location: [facility];  Service: Neurosurgery;  Laterality: N/A;   NECK SURGERY     Upper neck surgery; with wires placed   SHOULDER SURGERY Right 11/10/2011   arthroscopic surgery . Dr. Katrinka Blazing, [facility]   TEE WITHOUT CARDIOVERSION N/A 02/11/2023   Procedure: TRANSESOPHAGEAL ECHOCARDIOGRAM;  Surgeon: Debbe Odea, MD;  Location: [facility];  Service: Cardiovascular;  Laterality: N/A;   TONSILLECTOMY     TRICEPS TENDON REPAIR Left 12/04/2019   Procedure: TRICEPS TENDON REPAIR, OLECRANON BURSA;  Surgeon: Signa Kell, MD;  Location: [facility];  Service: Orthopedics;  Laterality: Left;    Allergies: Allergies as of 04/19/2023 - Review Complete 04/19/2023  Allergen Reaction Noted   Penicillins Other (See Comments) 11/18/2015    Medications: Outpatient Encounter Medications as of 04/19/2023  Medication Sig   amLODipine (NORVASC) 10 MG tablet TAKE ONE TABLET BY MOUTH ONCE A DAY   aspirin 81 MG chewable tablet Chew by mouth daily.   cefadroxil (DURICEF) 500 MG capsule Take 2 capsules (1,000 mg total) by mouth 2 (two) times daily.   diclofenac Sodium (VOLTAREN) 1 % GEL APPLY 4G TOPICALLY FIVE TIMES DAILY   fexofenadine (ALLEGRA ALLERGY) 180 MG tablet Take 1 tablet (180 mg total) by mouth daily.   finasteride (PROSCAR) 5 MG tablet TAKE ONE TABLET BY MOUTH ONCE A DAY   gabapentin (NEURONTIN) 300 MG capsule Take 1 capsule (300 mg total) by mouth at bedtime as needed.   latanoprost  (XALATAN) 0.005 % ophthalmic solution Place 1 drop into both eyes at bedtime.    menthol-cetylpyridinium (CEPACOL) 3 MG lozenge Take 1 lozenge (3 mg total) by mouth as needed for sore throat.   mirtazapine (REMERON) 30 MG tablet TAKE 1 TABLET BY MOUTH EVERY NIGHT AT BEDTIME   mometasone (ELOCON) 0.1 % cream Apply 1 application. topically daily as needed (Rash). Qd up to 5 days a week to itchy bumps on body   Multiple Vitamin (MULTIVITAMIN) tablet Take 1 tablet by mouth daily.   Probiotic Product (DIGESTIVE ADVANTAGE PO) Take 1 tablet by mouth daily.   psyllium (METAMUCIL) 58.6 % powder Take 1 packet by mouth daily as needed (regularity).    rosuvastatin (CRESTOR) 20 MG tablet Take 1 tablet (20 mg total) by mouth daily.   tamsulosin (FLOMAX) 0.4 MG CAPS capsule Take 1 capsule (0.4 mg total) by mouth daily.   timolol (TIMOPTIC) 0.5 % ophthalmic  solution Place 1 drop into both eyes daily.    tiZANidine (ZANAFLEX) 4 MG tablet TAKE ONE TABLET (4 MG TOTAL) BY MOUTH EVERY EIGHT (EIGHT) HOURS AS NEEDED FOR MUSCLE SPASMS.   traMADol (ULTRAM) 50 MG tablet Take 1 tablet (50 mg total) by mouth every 8 (eight) hours as needed. Do not take with gabapentin or tizanidine   No facility-administered encounter medications on file as of 04/19/2023.    Social History: Social History   Tobacco Use   Smoking status: Never   Smokeless tobacco: Never  Vaping Use   Vaping Use: Never used  Substance Use Topics   Alcohol use: No    Alcohol/week: 0.0 standard drinks of alcohol   Drug use: No    Family Medical History: Family History  Problem Relation Age of Onset   Depression Son    Diabetes Neg Hx    Cancer Neg Hx    Heart attack Neg Hx    Kidney disease Neg Hx    Prostate cancer Neg Hx    Kidney cancer Neg Hx    Bladder Cancer Neg Hx     Physical Examination: Today's Vitals   04/19/23 0851  BP: 115/70  Weight: 88.9 kg  Height: 6\' 3"  (1.905 m)  PainSc: 2   PainLoc: Back   Body mass index is  24.5 kg/m.   General: Patient is well developed, well nourished, calm, collected, and in no apparent distress. Attention to examination is appropriate.  Psychiatric: Patient is non-anxious.  Head:  Pupils equal, round, and reactive to light.  ENT:  Oral mucosa appears well hydrated.  Neck:   Supple.  Full range of motion.  Respiratory: Patient is breathing without any difficulty.  Extremities: No edema.  Vascular: Palpable dorsal pedal pulses.  Skin:   On exposed skin, there are no abnormal skin lesions.  NEUROLOGICAL:     Awake, alert, oriented to person, place, and time.  Speech is clear and fluent. Fund of knowledge is appropriate.   Cranial Nerves: Pupils equal round and reactive to light.  Facial tone is symmetric.  Facial sensation is symmetric.  ROM of spine: full.  Palpation of spine: non tender.    Strength: Side Biceps Triceps Deltoid Interossei Grip Wrist Ext. Wrist Flex.  R 5 5 5 5 5 5 5   L 5 5 5 5 5 5 5    Side Iliopsoas Quads Hamstring PF DF EHL  R 4+ 5 5 5 4 5   L 5 5 5 5 5 5    Reflexes are 1+ and symmetric at the biceps, triceps, brachioradialis, patella and achilles.   Hoffman's is absent.  Clonus is not present.  Toes are down-going.  Bilateral upper and lower extremity sensation is intact to light touch.     Medical Decision Making  Imaging: 03/02/23 MRI L-spine   IMPRESSION: 1. 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 Associated intradural component as above. 2. Associated paraspinous inflammatory changes with left psoas abscesses as above. 3. Underlying multilevel degenerative spondylosis and facet arthrosis, most pronounced at L4-5 where there is resultant moderate to severe spinal stenosis.     Electronically Signed   By: Rise Mu M.D.   On: 03/02/2023 21:19  02/08/23 MRI  L-spine  FINDINGS: Segmentation: 5 non rib-bearing vertebral bodies. The lowest fully formed disc space is labeled L5-S1.   Alignment: 5 mm anterolisthesis of L4 on L5 unchanged from prior exam.  4 mm retrolisthesis of L3 on L4 appears new from the prior exam. Mild dextrocurvature.   Vertebrae: No acute fracture or suspicious osseous lesion. A small amount of enhancement is noted at the inferior aspect of T12, where a Schmorl's node appears to be forming, and about the L3-L4 disc space, which correlates with edema on the STIR and is favored to be related to adjacent degenerative changes. No evidence of discitis. Postsurgical changes at L3-L4 and L4-L5.   Conus medullaris and cauda equina: Conus extends to the L2 level. Conus and cauda equina appear normal. No abnormal enhancement. No epidural collection.   Paraspinal and other soft tissues: Negative.   Disc levels:   T12-L1: Minimal disc bulge. Mild facet arthropathy. No spinal canal stenosis or neural foraminal narrowing.   L1-L2: No significant disc bulge. Mild facet arthropathy. No spinal canal stenosis or neural foraminal narrowing.   L2-L3: Minimal disc bulge. Mild facet arthropathy. Ligamentum flavum hypertrophy. Mild spinal canal stenosis and mild bilateral neural foraminal narrowing, which have progressed from the prior exam. Narrowing of the lateral recesses.   L3-L4: Mild retrolisthesis with disc osteophyte complex. Moderate facet arthropathy. Interval laminectomy. No residual spinal canal stenosis. Narrowing of the left lateral recess. Moderate left-greater-than-right neural foraminal narrowing, similar to prior   L4-L5: Status post fusion. Grade 1 anterolisthesis with disc unroofing. Severe facet arthropathy. Moderate spinal canal stenosis, which has progressed from the prior exam. Narrowing of the lateral recesses. No neural foraminal narrowing.   L5-S1: No significant disc bulge. Possible granulation tissue  along the anterior aspect of the thecal sac versus extruded disc. Moderate to severe right and mild left facet arthropathy. Mild spinal canal stenosis. Narrowing of the lateral recesses. Moderate right neural foraminal narrowing, which has progressed from the prior exam   IMPRESSION: 1. No evidence of discitis or osteomyelitis. No epidural collection. 2. L4-L5 moderate spinal canal stenosis, which has progressed from the prior exam. 3. L5-S1 mild spinal canal stenosis and moderate right neural foraminal narrowing, which has progressed from the prior exam. 4. L3-L4 moderate left-greater-than-right neural foraminal narrowing. 5. L2-L3 mild spinal canal stenosis and mild bilateral neural foraminal narrowing. 6. Narrowing of the lateral recesses at L2-L3, L3-L4, L4-L5, and L5-S1 could affect the descending L3, L4, L5, and S1 nerve roots, respectively.     Electronically Signed   By: Wiliam Ke M.D.  I have personally reviewed the images and agree with the above interpretation.  Assessment and Plan: Brett Bender is a pleasant 77 y.o. male with low back and right buttock pain.  Unfortunately he developed acute osteomyelitis with epidural abscess since his last visit on 02/08/2023.  He is being treated adequately and followed closely by infectious disease.  As a result of this his symptoms have improved some.  Despite treatment however he does continue to have ongoing back and right leg pain.  I am concerned that his MRI findings of significant facet arthropathy and stenosis may be contributing to this.  I would like for him to continue with antibiotic therapy and complete this.  We will reevaluate him in 6 weeks for any ongoing symptoms to discuss further plan of care.  We discussed that it may be reasonable to send him for an epidural steroid injection however I would need to have this cleared by infectious disease given his recent infection.  He was encouraged to call the office in the interim  should he have any questions or concerns.   I spent a total of 30  minutes in both face-to-face and non-face-to-face activities for this visit on the date of this encounter including review of records, review of imaging, discussion of symptoms, physical exam, discussion of treatment options, and documentation.   [facility]

## 2023-04-19 NOTE — Patient Instructions (Signed)

## 2023-04-19 NOTE — Progress Notes (Signed)
Follow-Up Visit   Subjective  Brett Bender is a 77 y.o. male who presents for the following: BCC bx proven (R post auricular, pt presents for excision).  The following portions of the chart were reviewed this encounter and updated as appropriate:   Tobacco  Allergies  Meds  Problems  Med Hx  Surg Hx  Fam Hx     Review of Systems:  No other skin or systemic complaints except as noted in HPI or Assessment and Plan.  Objective  Well appearing patient in no apparent distress; mood and affect are within normal limits.  A focused examination was performed including face. Relevant physical exam findings are noted in the Assessment and Plan.  R inferior post auricular/ear Healing biopsy site 3.5 x 1.2cm   Assessment & Plan  Basal cell carcinoma (BCC) of skin of right ear R inferior post auricular/ear  Skin excision  Lesion length (cm):  3.5 Lesion width (cm):  1.5 Margin per side (cm):  0.2 Total excision diameter (cm):  3.9 Informed consent: discussed and consent obtained   Timeout: patient name, date of birth, surgical site, and procedure verified   Procedure prep:  Patient was prepped and draped in usual sterile fashion Prep type:  Isopropyl alcohol and povidone-iodine Anesthesia: the lesion was anesthetized in a standard fashion   Anesthetic:  1% lidocaine w/ epinephrine 1-100,000 buffered w/ 8.4% NaHCO3 (9cc lido w/ epi, 3cc bupivicaine, Total of 12cc) Instrument used comment:  #15c blade Hemostasis achieved with: suture and pressure   Hemostasis achieved with comment:  Electrocautery Outcome: patient tolerated procedure well with no complications   Post-procedure details: sterile dressing applied and wound care instructions given   Dressing type: bandage, pressure dressing and bacitracin (mupirocin)   Additional details:  Tagged 12:00 superior edge  Skin repair Complexity:  Complex A to T Flap closure 11.25 cm squared Reason for type of repair: reduce tension  to allow closure, reduce the risk of dehiscence, infection, and necrosis, reduce subcutaneous dead space and avoid a hematoma, allow closure of the large defect, preserve normal anatomy, preserve normal anatomical and functional relationships and enhance both functionality and cosmetic results   Undermining: area extensively undermined   Undermining comment:  Undermining defect 1.9cm Subcutaneous layers (deep stitches):  Suture size:  3-0 Suture type: Vicryl (polyglactin 910)   Subcutaneous suture technique: inverted dermal. Fine/surface layer approximation (top stitches):  Suture size:  3-0 Suture type: nylon   Stitches: simple interrupted   Suture removal (days):  7 Hemostasis achieved with: suture and pressure Outcome: patient tolerated procedure well with no complications   Post-procedure details: sterile dressing applied and wound care instructions given   Dressing type: bandage, pressure dressing and bacitracin (mupirocin)   Additional details:  4.5cm (length) x 2.5cm (width)  mupirocin ointment (BACTROBAN) 2 % Apply 1 Application topically daily. Qd to excision site  Specimen 1 - Surgical pathology Differential Diagnosis: Biopsy proven BCC Check Margins: No [id number] Tagged at 12:00 superior edge  Bx proven, excised today Start Mupirocin ointment qd to excision site  Return in about 1 week (around 04/26/2023) for suture removal.  I, Ardis Rowan, RMA, am acting as scribe for Armida Sans, MD . Documentation: I have reviewed the above documentation for accuracy and completeness, and I agree with the above.  Armida Sans, MD

## 2023-04-20 ENCOUNTER — Encounter: Payer: Self-pay | Admitting: Dermatology

## 2023-04-20 ENCOUNTER — Telehealth: Payer: Self-pay

## 2023-04-20 ENCOUNTER — Other Ambulatory Visit: Payer: Self-pay | Admitting: Family Medicine

## 2023-04-20 DIAGNOSIS — M5416 Radiculopathy, lumbar region: Secondary | ICD-10-CM

## 2023-04-20 NOTE — Telephone Encounter (Signed)
Patient doing well after yesterdays surgery./sh 

## 2023-04-21 ENCOUNTER — Ambulatory Visit: Payer: Medicare Other | Admitting: Neurosurgery

## 2023-04-21 ENCOUNTER — Telehealth: Payer: Self-pay

## 2023-04-21 DIAGNOSIS — H401131 Primary open-angle glaucoma, bilateral, mild stage: Secondary | ICD-10-CM | POA: Diagnosis not present

## 2023-04-21 DIAGNOSIS — H35033 Hypertensive retinopathy, bilateral: Secondary | ICD-10-CM | POA: Diagnosis not present

## 2023-04-21 NOTE — Telephone Encounter (Signed)
Copied from CRM [id number]. Topic: General - Other >> Apr 21, 2023  1:11 PM Epimenio Foot F wrote: Reason for CRM: Home Health Verbal Orders - Caller/Agency: [facility]  Callback Number: [phone] option 2 Requesting OT/PT/Skilled Nursing/Social Work/Speech Therapy: Nursing  Frequency: One week Five

## 2023-04-21 NOTE — Telephone Encounter (Signed)
That's fine

## 2023-04-21 NOTE — Telephone Encounter (Signed)
Requested medication (s) are due for refill today: yes  Requested medication (s) are on the active medication list: yes  Last refill:  03/16/23 #30 1 refills  Future visit scheduled: yes in 2 weeks   Notes to clinic:  not delegated per protocol. Do you want to refill Rx?     Requested Prescriptions  Pending Prescriptions Disp Refills   traMADol (ULTRAM) 50 MG tablet [Pharmacy Med Name: TRAMADOL HYDROCHLORIDE 50MG  TABLET] 30 tablet 1    Sig: TAKE ONE TABLET (50 MG TOTAL) BY MOUTH EVERY EIGHT (EIGHT) HOURS AS NEEDED. DO NOT TAKE WITH GABAPENTIN OR TIZANIDINE     Not Delegated - Analgesics:  Opioid Agonists Failed - 04/20/2023  3:05 PM      Failed - This refill cannot be delegated      Passed - Urine Drug Screen completed in last 360 days      Passed - Valid encounter within last 3 months    Recent Outpatient Visits           1 month ago Lumbar radiculopathy   [place] [facility] Malva Limes, MD   1 month ago Lumbar radiculopathy   [place] [facility] [name], PA-C   1 month ago Hospital discharge follow-up   [facility] Merita Norton T, FNP   2 months ago Lumbar radiculopathy   [facility] Malva Limes, MD   2 months ago Acute right-sided low back pain without sciatica   [facility] Malva Limes, MD       Future Appointments             In 5 days Deirdre Evener, MD [facility]   In 2 weeks Fisher, Demetrios Isaacs, MD [facility]   In 1 month McGowan, Elana [facility] [place]   In 4 months Deirdre Evener, MD [facility]

## 2023-04-21 NOTE — Telephone Encounter (Signed)
[facility] advised of approved orders.

## 2023-04-25 ENCOUNTER — Other Ambulatory Visit: Payer: Self-pay | Admitting: Physician Assistant

## 2023-04-25 ENCOUNTER — Other Ambulatory Visit: Payer: Self-pay | Admitting: Family Medicine

## 2023-04-25 DIAGNOSIS — M1990 Unspecified osteoarthritis, unspecified site: Secondary | ICD-10-CM

## 2023-04-26 ENCOUNTER — Ambulatory Visit (INDEPENDENT_AMBULATORY_CARE_PROVIDER_SITE_OTHER): Payer: Medicare Other | Admitting: Dermatology

## 2023-04-26 ENCOUNTER — Other Ambulatory Visit: Payer: Self-pay | Admitting: Family Medicine

## 2023-04-26 VITALS — BP 142/80 | HR 86

## 2023-04-26 DIAGNOSIS — Z85828 Personal history of other malignant neoplasm of skin: Secondary | ICD-10-CM

## 2023-04-26 NOTE — Patient Instructions (Signed)
Due to recent changes in healthcare laws, you may see results of your pathology and/or laboratory studies on MyChart before the doctors have had a chance to review them. We understand that in some cases there may be results that are confusing or concerning to you. Please understand that not all results are received at the same time and often the doctors may need to interpret multiple results in order to provide you with the best plan of care or course of treatment. Therefore, we ask that you please give us 2 business days to thoroughly review all your results before contacting the office for clarification. Should we see a critical lab result, you will be contacted sooner.   If You Need Anything After Your Visit  If you have any questions or concerns for your doctor, please call our main line at [phone] and press option 4 to reach your doctor's medical assistant. If no one answers, please leave a voicemail as directed and we will return your call as soon as possible. Messages left after 4 pm will be answered the following business day.   You may also send us a message via MyChart. We typically respond to MyChart messages within 1-2 business days.  For prescription refills, please ask your pharmacy to contact our office. Our fax number is [phone].  If you have an urgent issue when the clinic is closed that cannot wait until the next business day, you can page your doctor at the number below.    Please note that while we do our best to be available for urgent issues outside of office hours, we are not available 24/7.   If you have an urgent issue and are unable to reach us, you may choose to seek medical care at your doctor's office, retail clinic, urgent care center, or emergency room.  If you have a medical emergency, please immediately call 911 or go to the emergency department.  Pager Numbers  - Dr. Kowalski: [phone]  - Dr. Moye: [phone]  - Dr. Stewart:  [phone]  In the event of inclement weather, please call our main line at [phone] for an update on the status of any delays or closures.  Dermatology Medication Tips: Please keep the boxes that topical medications come in in order to help keep track of the instructions about where and how to use these. Pharmacies typically print the medication instructions only on the boxes and not directly on the medication tubes.   If your medication is too expensive, please contact our office at [phone] option 4 or send us a message through MyChart.   We are unable to tell what your co-pay for medications will be in advance as this is different depending on your insurance coverage. However, we may be able to find a substitute medication at lower cost or fill out paperwork to get insurance to cover a needed medication.   If a prior authorization is required to get your medication covered by your insurance company, please allow us 1-2 business days to complete this process.  Drug prices often vary depending on where the prescription is filled and some pharmacies may offer cheaper prices.  The website www.goodrx.com contains coupons for medications through different pharmacies. The prices here do not account for what the cost may be with help from insurance (it may be cheaper with your insurance), but the website can give you the price if you did not use any insurance.  - You can print the associated coupon and take it with   your prescription to the pharmacy.  - You may also stop by our office during regular business hours and pick up a GoodRx coupon card.  - If you need your prescription sent electronically to a different pharmacy, notify our office through [place] MyChart or by phone at [phone] option 4.     Si Usted Necesita Algo Despus de Su Visita  Tambin puede enviarnos un mensaje a travs de MyChart. Por lo general respondemos a los mensajes de MyChart en el transcurso de 1 a 2  das hbiles.  Para renovar recetas, por favor pida a su farmacia que se ponga en contacto con nuestra oficina. Nuestro nmero de fax es el [phone].  Si tiene un asunto urgente cuando la clnica est cerrada y que no puede esperar hasta el siguiente da hbil, puede llamar/localizar a su doctor(a) al nmero que aparece a continuacin.   Por favor, tenga en cuenta que aunque hacemos todo lo posible para estar disponibles para asuntos urgentes fuera del horario de oficina, no estamos disponibles las 24 horas del da, los 7 das de la semana.   Si tiene un problema urgente y no puede comunicarse con nosotros, puede optar por buscar atencin mdica  en el consultorio de su doctor(a), en una clnica privada, en un centro de atencin urgente o en una sala de emergencias.  Si tiene una emergencia mdica, por favor llame inmediatamente al 911 o vaya a la sala de emergencias.  Nmeros de bper  - Dr. Kowalski: [phone]  - Dra. Moye: [phone]  - Dra. Stewart: [phone]  En caso de inclemencias del tiempo, por favor llame a nuestra lnea principal al [phone] para una actualizacin sobre el estado de cualquier retraso o cierre.  Consejos para la medicacin en dermatologa: Por favor, guarde las cajas en las que vienen los medicamentos de uso tpico para ayudarle a seguir las instrucciones sobre dnde y cmo usarlos. Las farmacias generalmente imprimen las instrucciones del medicamento slo en las cajas y no directamente en los tubos del medicamento.   Si su medicamento es muy caro, por favor, pngase en contacto con nuestra oficina llamando al [phone] y presione la opcin 4 o envenos un mensaje a travs de MyChart.   No podemos decirle cul ser su copago por los medicamentos por adelantado ya que esto es diferente dependiendo de la cobertura de su seguro. Sin embargo, es posible que podamos encontrar un medicamento sustituto a menor costo o llenar un formulario para que el  seguro cubra el medicamento que se considera necesario.   Si se requiere una autorizacin previa para que su compaa de seguros cubra su medicamento, por favor permtanos de 1 a 2 das hbiles para completar este proceso.  Los precios de los medicamentos varan con frecuencia dependiendo del lugar de dnde se surte la receta y alguna farmacias pueden ofrecer precios ms baratos.  El sitio web www.goodrx.com tiene cupones para medicamentos de diferentes farmacias. Los precios aqu no tienen en cuenta lo que podra costar con la ayuda del seguro (puede ser ms barato con su seguro), pero el sitio web puede darle el precio si no utiliz ningn seguro.  - Puede imprimir el cupn correspondiente y llevarlo con su receta a la farmacia.  - Tambin puede pasar por nuestra oficina durante el horario de atencin regular y recoger una tarjeta de cupones de GoodRx.  - Si necesita que su receta se enve electrnicamente a una farmacia diferente, informe a nuestra oficina a travs de MyChart de [place]   o por telfono llamando al [phone] y presione la opcin 4.  

## 2023-04-26 NOTE — Telephone Encounter (Signed)
Requested Prescriptions  Refused Prescriptions Disp Refills   diclofenac (VOLTAREN) 50 MG EC tablet [Pharmacy Med Name: DICLOFENAC SODIUM DR 50MG  DR TABLET DR] 180 tablet 0    Sig: TAKE ONE TABLET BY MOUTH TWICE DAILY AS NEEDED     Analgesics:  NSAIDS Failed - 04/25/2023  9:43 AM      Failed - Manual Review: Labs are only required if the patient has taken medication for more than 8 weeks.      Failed - HGB in normal range and within 360 days    Hemoglobin  Date Value Ref Range Status  03/08/2023 12.1 (L) 13.0 - 17.0 g/dL Final  96/02/5408 81.1 13.0 - 17.7 g/dL Final         Failed - HCT in normal range and within 360 days    HCT  Date Value Ref Range Status  03/08/2023 36.3 (L) 39.0 - 52.0 % Final   Hematocrit  Date Value Ref Range Status  02/23/2023 40.3 37.5 - 51.0 % Final         Passed - Cr in normal range and within 360 days    Creatinine  Date Value Ref Range Status  02/27/2012 0.93 0.60 - 1.30 mg/dL Final   Creatinine, Ser  Date Value Ref Range Status  03/08/2023 0.79 0.61 - 1.24 mg/dL Final         Passed - PLT in normal range and within 360 days    Platelets  Date Value Ref Range Status  03/08/2023 284 150 - 400 K/uL Final  02/23/2023 677 (H) 150 - 450 x10E3/uL Final         Passed - eGFR is 30 or above and within 360 days    EGFR (African American)  Date Value Ref Range Status  02/27/2012 >60  Final   GFR calc Af Amer  Date Value Ref Range Status  12/31/2020 67 >59 mL/min/1.73 Final    Comment:    **In accordance with recommendations from the NKF-ASN Task force,**   [facility] is in the process of updating its eGFR calculation to the   2021 CKD-EPI creatinine equation that estimates kidney function   without a race variable.    EGFR (Non-African Amer.)  Date Value Ref Range Status  02/27/2012 >60  Final    Comment:    eGFR values <2mL/min/1.73 m2 may be an indication of chronic kidney disease (CKD). Calculated eGFR is useful in patients with  stable renal function. The eGFR calculation will not be reliable in acutely ill patients when serum creatinine is changing rapidly. It is not useful in  patients on dialysis. The eGFR calculation may not be applicable to patients at the low and high extremes of body sizes, pregnant women, and vegetarians.    GFR, Estimated  Date Value Ref Range Status  03/08/2023 >60 >60 mL/min Final    Comment:    (NOTE) Calculated using the CKD-EPI Creatinine Equation (2021)    eGFR  Date Value Ref Range Status  02/23/2023 73 >59 mL/min/1.73 Final         Passed - Patient is not pregnant      Passed - Valid encounter within last 12 months    Recent Outpatient Visits           1 month ago Lumbar radiculopathy   [facility] Malva Limes, MD   1 month ago Lumbar radiculopathy   [facility] [name], PA-C   2 months ago Hospital discharge  follow-up   [facility] Merita Norton T, FNP   2 months ago Lumbar radiculopathy   [facility] Malva Limes, MD   2 months ago Acute right-sided low back pain without sciatica   [facility] Malva Limes, MD       Future Appointments             Today Deirdre Evener, MD [facility]   In 2 weeks Sherrie Mustache, Demetrios Isaacs, MD [facility]   In 1 month McGowan, Elana Alm [facility] [place]   In 4 months Deirdre Evener, MD [facility]

## 2023-04-26 NOTE — Progress Notes (Unsigned)
   Follow-Up Visit   Subjective  Brett Bender is a 77 y.o. male who presents for the following: Suture removal at the right inferior post auricular/ear   Pathology showed RESIDUAL BASAL CELL CARCINOMA   The following portions of the chart were reviewed this encounter and updated as appropriate: medications, allergies, medical history  Review of Systems:  No other skin or systemic complaints except as noted in HPI or Assessment and Plan.  Objective  Well appearing patient in no apparent distress; mood and affect are within normal limits.  Areas Examined: Face,ears  Relevant physical exam findings are noted in the Assessment and Plan.   Assessment & Plan   BCC Right inferior post auricular - S/P Excision with + Margins Plan to let heal and re-check next visit to re-evaluate for further possible treatment.  Encounter for Removal of Sutures - Incision site is clean, dry and intact. - Wound cleansed, sutures removed, wound cleansed and steri strips applied.  - Discussed pathology results showing RESIDUAL BASAL CELL CARCINOMA  - Patient advised to keep steri-strips dry until they fall off. - Scars remodel for a full year. - Once steri-strips fall off, patient can apply over-the-counter silicone scar cream once to twice a day to help with scar remodeling if desired. - Patient advised to call with any concerns or if they notice any new or changing lesions.  Return for scheduled appt in Nov .  I, Angelique Holm, CMA, am acting as scribe for Armida Sans, MD .   Documentation: I have reviewed the above documentation for accuracy and completeness, and I agree with the above.  Armida Sans, MD

## 2023-04-26 NOTE — Telephone Encounter (Signed)
Prescription request sentf for the second time today for Diclofenac oral tablets. Med was dc'd at hospital by Dr Lyn Hollingshead. Called pt and he is requesting the Diclofenac to be reorder for arthritis. Routing note to PCP.   Requested Prescriptions  Pending Prescriptions Disp Refills   diclofenac (VOLTAREN) 50 MG EC tablet [Pharmacy Med Name: DICLOFENAC SODIUM DR 50MG  DR TABLET DR] 180 tablet 0    Sig: TAKE ONE TABLET BY MOUTH TWICE DAILY AS NEEDED     Analgesics:  NSAIDS Failed - 04/26/2023 11:23 AM      Failed - Manual Review: Labs are only required if the patient has taken medication for more than 8 weeks.      Failed - HGB in normal range and within 360 days    Hemoglobin  Date Value Ref Range Status  03/08/2023 12.1 (L) 13.0 - 17.0 g/dL Final  16/08/9603 54.0 13.0 - 17.7 g/dL Final         Failed - HCT in normal range and within 360 days    HCT  Date Value Ref Range Status  03/08/2023 36.3 (L) 39.0 - 52.0 % Final   Hematocrit  Date Value Ref Range Status  02/23/2023 40.3 37.5 - 51.0 % Final         Passed - Cr in normal range and within 360 days    Creatinine  Date Value Ref Range Status  02/27/2012 0.93 0.60 - 1.30 mg/dL Final   Creatinine, Ser  Date Value Ref Range Status  03/08/2023 0.79 0.61 - 1.24 mg/dL Final         Passed - PLT in normal range and within 360 days    Platelets  Date Value Ref Range Status  03/08/2023 284 150 - 400 K/uL Final  02/23/2023 677 (H) 150 - 450 x10E3/uL Final         Passed - eGFR is 30 or above and within 360 days    EGFR (African American)  Date Value Ref Range Status  02/27/2012 >60  Final   GFR calc Af Amer  Date Value Ref Range Status  12/31/2020 67 >59 mL/min/1.73 Final    Comment:    **In accordance with recommendations from the NKF-ASN Task force,**   [facility] is in the process of updating its eGFR calculation to the   2021 CKD-EPI creatinine equation that estimates kidney function   without a race variable.    EGFR  (Non-African Amer.)  Date Value Ref Range Status  02/27/2012 >60  Final    Comment:    eGFR values <58mL/min/1.73 m2 may be an indication of chronic kidney disease (CKD). Calculated eGFR is useful in patients with stable renal function. The eGFR calculation will not be reliable in acutely ill patients when serum creatinine is changing rapidly. It is not useful in  patients on dialysis. The eGFR calculation may not be applicable to patients at the low and high extremes of body sizes, pregnant women, and vegetarians.    GFR, Estimated  Date Value Ref Range Status  03/08/2023 >60 >60 mL/min Final    Comment:    (NOTE) Calculated using the CKD-EPI Creatinine Equation (2021)    eGFR  Date Value Ref Range Status  02/23/2023 73 >59 mL/min/1.73 Final         Passed - Patient is not pregnant      Passed - Valid encounter within last 12 months    Recent Outpatient Visits           1 month  ago Lumbar radiculopathy   [facility] Malva Limes, MD   1 month ago Lumbar radiculopathy   [facility] [name], PA-C   2 months ago Hospital discharge follow-up   [facility] Merita Norton T, FNP   2 months ago Lumbar radiculopathy   [facility] Malva Limes, MD   2 months ago Acute right-sided low back pain without sciatica   [facility] Malva Limes, MD       Future Appointments             Today Deirdre Evener, MD [place] [facility]   In 2 weeks Fisher, Demetrios Isaacs, MD [facility]   In 1 month McGowan, Elana Alm [facility] [place]   In 4 months Deirdre Evener, MD [facility]

## 2023-04-27 ENCOUNTER — Encounter: Payer: Self-pay | Admitting: Dermatology

## 2023-05-11 ENCOUNTER — Ambulatory Visit (INDEPENDENT_AMBULATORY_CARE_PROVIDER_SITE_OTHER): Payer: Medicare Other | Admitting: Family Medicine

## 2023-05-11 VITALS — BP 131/84 | HR 59 | Temp 98.4°F | Ht 75.0 in | Wt 200.0 lb

## 2023-05-11 DIAGNOSIS — I10 Essential (primary) hypertension: Secondary | ICD-10-CM

## 2023-05-11 DIAGNOSIS — E785 Hyperlipidemia, unspecified: Secondary | ICD-10-CM

## 2023-05-11 NOTE — Progress Notes (Signed)
Established patient visit   Patient: Brett Bender   DOB: May 12, 1946   77 y.o. Male  MRN: [id number] Visit Date: 05/11/2023  Today's healthcare provider: [name], MD   Chief Complaint  Patient presents with   Hyperlipidemia    Patient was started on Crestor after labs were done in April.     Subjective    HPI  This is a new problem.  Current therapy includes no antihyperlipidemic treatment.  Compliance with treatment is good .  Chest pain: Absent.  Dyspnea: Absent.  Lower extremity edema: Present.  Numbness or tingling of extremity: Absent.  Palpitations: Absent.  Paroxysmal nocturnal dyspnea: Absent.  Syncope: Absent. Additional comments: Patient was started on Crestor after labs were done in April.  Lab Results  Component Value Date   CHOL 164 02/23/2023   HDL 42 02/23/2023   LDLCALC 100 (H) 02/23/2023   TRIG 120 02/23/2023   CHOLHDL 3.9 02/23/2023  The 10-year ASCVD risk score (Arnett DK, et al., 2019) is: 31%   Values used to calculate the score:     Age: 28 years     Sex: Male     Is Non-Hispanic African American: No     Diabetic: No     Tobacco smoker: No     Systolic Blood Pressure: 131 mmHg     Is BP treated: Yes     HDL Cholesterol: 42 mg/dL     Total Cholesterol: 164 mg/dL   Medications: Outpatient Medications Prior to Visit  Medication Sig   amLODipine (NORVASC) 10 MG tablet TAKE ONE TABLET BY MOUTH ONCE A DAY   aspirin 81 MG chewable tablet Chew by mouth daily.   cefadroxil (DURICEF) 500 MG capsule Take 2 capsules (1,000 mg total) by mouth 2 (two) times daily.   diclofenac (VOLTAREN) 50 MG EC tablet TAKE ONE TABLET BY MOUTH TWICE DAILY AS NEEDED   diclofenac Sodium (VOLTAREN) 1 % GEL APPLY 4G TOPICALLY FIVE TIMES DAILY   fexofenadine (ALLEGRA ALLERGY) 180 MG tablet Take 1 tablet (180 mg total) by mouth daily.   finasteride (PROSCAR) 5 MG tablet TAKE ONE TABLET BY MOUTH ONCE A DAY   gabapentin (NEURONTIN) 300 MG capsule Take 1 capsule (300 mg  total) by mouth at bedtime as needed.   latanoprost (XALATAN) 0.005 % ophthalmic solution Place 1 drop into both eyes at bedtime.    menthol-cetylpyridinium (CEPACOL) 3 MG lozenge Take 1 lozenge (3 mg total) by mouth as needed for sore throat.   mirtazapine (REMERON) 30 MG tablet TAKE 1 TABLET BY MOUTH EVERY NIGHT AT BEDTIME   mometasone (ELOCON) 0.1 % cream Apply 1 application. topically daily as needed (Rash). Qd up to 5 days a week to itchy bumps on body   Multiple Vitamin (MULTIVITAMIN) tablet Take 1 tablet by mouth daily.   mupirocin ointment (BACTROBAN) 2 % Apply 1 Application topically daily. Qd to excision site   Probiotic Product (DIGESTIVE ADVANTAGE PO) Take 1 tablet by mouth daily.   psyllium (METAMUCIL) 58.6 % powder Take 1 packet by mouth daily as needed (regularity).    rosuvastatin (CRESTOR) 20 MG tablet Take 1 tablet (20 mg total) by mouth daily.   tamsulosin (FLOMAX) 0.4 MG CAPS capsule Take 1 capsule (0.4 mg total) by mouth daily.   timolol (TIMOPTIC) 0.5 % ophthalmic solution Place 1 drop into both eyes daily.    tiZANidine (ZANAFLEX) 4 MG tablet TAKE ONE TABLET (4 MG TOTAL) BY MOUTH EVERY EIGHT (EIGHT) HOURS AS  NEEDED FOR MUSCLE SPASMS.   traMADol (ULTRAM) 50 MG tablet TAKE ONE TABLET (50 MG TOTAL) BY MOUTH EVERY EIGHT (EIGHT) HOURS AS NEEDED. DO NOT TAKE WITH GABAPENTIN OR TIZANIDINE   No facility-administered medications prior to visit.    Review of Systems     Objective    BP 131/84 (BP Location: Left Arm, Patient Position: Sitting, Cuff Size: Normal)   Pulse (!) 59   Temp 98.4 F (36.9 C) (Oral)   Ht 6\' 3"  (1.905 m)   Wt 200 lb (90.7 kg)   SpO2 98%   BMI 25.00 kg/m      Assessment & Plan     1. Elevated lipids Doing well since rosuvastatin started in April check labs for better idea of risk and lipid goals.  - Comprehensive metabolic panel - Lipid Panel With LDL/HDL Ratio - Apolipoprotein B - Lipoprotein A (LPA)  2. Essential (primary)  hypertension Well controlled.  Continue current medications.           [name], MD  [facility] [phone] (phone) [phone] (fax)  [facility]

## 2023-05-12 LAB — LIPID PANEL WITH LDL/HDL RATIO
LDL/HDL Ratio: 0.9 ratio (ref 0.0–3.6)
VLDL Cholesterol Cal: 22 mg/dL (ref 5–40)

## 2023-05-12 LAB — COMPREHENSIVE METABOLIC PANEL
ALT: 24 IU/L (ref 0–44)
BUN: 17 mg/dL (ref 8–27)
CO2: 25 mmol/L (ref 20–29)
Chloride: 103 mmol/L (ref 96–106)
Creatinine, Ser: 0.89 mg/dL (ref 0.76–1.27)
Potassium: 4.5 mmol/L (ref 3.5–5.2)

## 2023-05-12 LAB — LIPOPROTEIN A (LPA)

## 2023-05-12 LAB — APOLIPOPROTEIN B: Apolipoprotein B: 47 mg/dL (ref <90)

## 2023-05-13 ENCOUNTER — Ambulatory Visit: Payer: Self-pay | Admitting: *Deleted

## 2023-05-13 LAB — COMPREHENSIVE METABOLIC PANEL
AST: 26 IU/L (ref 0–40)
Albumin: 4.3 g/dL (ref 3.8–4.8)
Alkaline Phosphatase: 91 IU/L (ref 44–121)
BUN/Creatinine Ratio: 19 (ref 10–24)
Bilirubin Total: 0.5 mg/dL (ref 0.0–1.2)
Calcium: 9.7 mg/dL (ref 8.6–10.2)
Globulin, Total: 2.8 g/dL (ref 1.5–4.5)
Glucose: 104 mg/dL — ABNORMAL HIGH (ref 70–99)
Sodium: 142 mmol/L (ref 134–144)
Total Protein: 7.1 g/dL (ref 6.0–8.5)
eGFR: 89 mL/min/{1.73_m2} (ref >59)

## 2023-05-13 LAB — LIPID PANEL WITH LDL/HDL RATIO
Cholesterol, Total: 111 mg/dL (ref 100–199)
HDL: 46 mg/dL (ref >39)
LDL Chol Calc (NIH): 43 mg/dL (ref 0–99)
Triglycerides: 126 mg/dL (ref 0–149)

## 2023-05-13 NOTE — Patient Outreach (Signed)
  Care Coordination   Follow Up Visit Note   05/13/2023 Name: Brett Bender MRN: [id number] DOB: 02-03-1946  Brett Bender is a 77 y.o. year old male who sees Fisher, Demetrios Isaacs, MD for primary care. I spoke with  [name] by phone today.  What matters to the patients health and wellness today?  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 and patient's adherence to plan as established by provider Advised patient to look for signs of recurrent infection Discussed plans with patient for ongoing care management follow up and provided patient with direct contact information for care management team         SDOH assessments and interventions completed:  No     Care Coordination Interventions:  Yes, provided   Interventions Today    Flowsheet Row Most Recent Value  Chronic Disease   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  Will take last PO dose of antibiotics today. Not working with PT, but has been going to the gym to increase leg strength]  Labs Reviewed Lipid Profile  [lipid profile improved since on Crestor]       Follow up plan: Follow up call scheduled for 8/9    Encounter Outcome:  Pt. Visit Completed   Kemper Durie, RN, MSN, [facility]  Management Care Management Coordinator [phone]

## 2023-05-31 ENCOUNTER — Ambulatory Visit (INDEPENDENT_AMBULATORY_CARE_PROVIDER_SITE_OTHER): Payer: Medicare Other | Admitting: Neurosurgery

## 2023-05-31 ENCOUNTER — Encounter: Payer: Self-pay | Admitting: Neurosurgery

## 2023-05-31 VITALS — BP 134/80 | Ht 75.0 in | Wt 200.2 lb

## 2023-05-31 DIAGNOSIS — M4646 Discitis, unspecified, lumbar region: Secondary | ICD-10-CM | POA: Diagnosis not present

## 2023-05-31 DIAGNOSIS — M5416 Radiculopathy, lumbar region: Secondary | ICD-10-CM

## 2023-05-31 DIAGNOSIS — M47817 Spondylosis without myelopathy or radiculopathy, lumbosacral region: Secondary | ICD-10-CM

## 2023-05-31 NOTE — Progress Notes (Signed)
Referring Physician:  Malva Limes, MD [address] [place],  [place] [address]  Primary Physician:  Malva Limes, MD  History of Present Illness: 05/31/23 [name] is a 77 year old presenting today for 6 week follow-up of back and leg pain.  He states that his back pain and leg pain have completely resolved.  He has finished antibiotics about 2 weeks ago.  His primary concern today is ongoing weakness in his legs however he has returned to the gym and is doing well with this.  He also endorses some left knee pain intermittently with walking.  This has been going on for several years and is largely unchanged.  04/19/23 [name] is a 77 year old presenting today for follow-up regarding low back and right hip pain.  Since his last visit he presented to the ER on 03/02/2023 for worsening back pain after an MRI which showed epidural abscess.  He was encouraged by infectious disease to present to the ER.  At that time he was having difficulty with urination and worsening pain upon standing and walking.  Neurosurgery was consulted and recommended admission to medicine for further workup, lab results, and broad-spectrum antibiotics with ID consultation.  He ultimately underwent drainage of epidural abscess by IR and his antibiotics were de-escalated to cefazolin.  The patient had his Foley catheter removal in patient and was able to pass a trial of void.  He was discharged on 03/08/2023.  It was recommended that he complete a 6-week course of antibiotics. He followed up with infectious disease on 03/22/2023 who recommended that he continue IV cefazolin until 04/13/2023 and then complete a 4-week course of PO Cefadroxil which which he has transitioned to at this time.  Today he does report some ongoing low back pain that radiates into his right buttock and lateral thigh.  He states that this has improved some since his last visit however.  He has returned to the  gym.   02/22/2023 [name] is here today for hospital follow-up with a chief complaint of low back pain that radiates into he right hip, but no pain down the legs.  He was initially seen in the ER on 02/07/2023 for an acute onset of right-sided flank pain that radiated into his right hip.  He was found to have sepsis and started on antibiotics for a staph infection.  He was also found to have an AKI. Today he reports significant improvement of his back pain however he does continue to have some pain to his back and pain that radiates into his right buttock.  He denies any pain that radiates down his legs.  He denies any numbness or tingling in the legs.   He is currently working with home physical therapy and feels this is going well. Conservative measures:  Physical therapy: has not participated Multimodal medical therapy including regular antiinflammatories:  tizanidine, norco,  prednisone, tramadol  Injections: has not received epidural steroid injections  Past Surgery: L3-4 decompression 09/19/18 by Dr Adriana Simas Lumbar fusion in 2010 Neck surgery over 10+ years ago  [facility] LOR has no symptoms of cervical myelopathy.  The symptoms are causing a significant impact on the patient's life.   Review of Systems:  A 10 point review of systems is negative, except for the pertinent positives and negatives detailed in the HPI.  Past Medical History: Past Medical History:  Diagnosis Date   Actinic keratosis    Arthritis    Basal cell carcinoma  11/09/2013   R lateral edge of clavicle   Basal cell carcinoma 02/03/2016   L lateral supraclavicular base of neck/excision   Basal cell carcinoma 11/29/2018   L upper back   Basal cell carcinoma 09/11/2020   R mid back    Basal cell carcinoma 03/17/2023   right inferior post auricular, excised 04/19/23   Cancer (HCC)    Basal cell carcinoma bilateral arms and left shoulder   Complication of anesthesia    slow to wake up with shoulder  surgery   Depression    Enlarged prostate    H/O elbow surgery    left   Herpes simplex    History of kidney stones    History of MRSA infection 2004   skin around eye   Hypertension    Kidney stone    Lumbar stenosis    Migraine headache    Motion sickness    ocean boats   PONV (postoperative nausea and vomiting)    Tendonitis of ankle, left     Past Surgical History: Past Surgical History:  Procedure Laterality Date   APPENDECTOMY     back injection     BACK SURGERY     CYSTOSCOPY/URETEROSCOPY/HOLMIUM LASER/STENT PLACEMENT Left 12/18/2015   Procedure: CYSTOSCOPY/URETEROSCOPY/HOLMIUM LASER LITHOTRIPSY;  Surgeon: Vanna Scotland, MD;  Location: [facility];  Service: Urology;  Laterality: Left;   ELBOW SURGERY     IR LUMBAR DISC ASPIRATION W/IMG GUIDE  03/03/2023   LOWER BACK SURGERY  2010   L4-5 fusion   LUMBAR LAMINECTOMY/DECOMPRESSION MICRODISCECTOMY N/A 08/21/2018   Procedure: LUMBAR LAMINECTOMY/DECOMPRESSION MICRODISCECTOMY 1 LEVEL-L3/4;  Surgeon: Lucy Chris, MD;  Location: [facility];  Service: Neurosurgery;  Laterality: N/A;   NECK SURGERY     Upper neck surgery; with wires placed   SHOULDER SURGERY Right 11/10/2011   arthroscopic surgery . Dr. Katrinka Blazing, [place] [facility]   TEE WITHOUT CARDIOVERSION N/A 02/11/2023   Procedure: TRANSESOPHAGEAL ECHOCARDIOGRAM;  Surgeon: Debbe Odea, MD;  Location: [facility];  Service: Cardiovascular;  Laterality: N/A;   TONSILLECTOMY     TRICEPS TENDON REPAIR Left 12/04/2019   Procedure: TRICEPS TENDON REPAIR, OLECRANON BURSA;  Surgeon: Signa Kell, MD;  Location: [facility];  Service: Orthopedics;  Laterality: Left;    Allergies: Allergies as of 05/31/2023 - Review Complete 05/31/2023  Allergen Reaction Noted   Penicillins Other (See Comments) 11/18/2015    Medications: Outpatient Encounter Medications as of 05/31/2023  Medication Sig   amLODipine (NORVASC) 10 MG tablet TAKE ONE TABLET BY MOUTH ONCE A DAY   aspirin 81 MG chewable  tablet Chew by mouth daily.   cefadroxil (DURICEF) 500 MG capsule Take 2 capsules (1,000 mg total) by mouth 2 (two) times daily.   diclofenac (VOLTAREN) 50 MG EC tablet TAKE ONE TABLET BY MOUTH TWICE DAILY AS NEEDED   diclofenac Sodium (VOLTAREN) 1 % GEL APPLY 4G TOPICALLY FIVE TIMES DAILY   fexofenadine (ALLEGRA ALLERGY) 180 MG tablet Take 1 tablet (180 mg total) by mouth daily.   finasteride (PROSCAR) 5 MG tablet TAKE ONE TABLET BY MOUTH ONCE A DAY   gabapentin (NEURONTIN) 300 MG capsule Take 1 capsule (300 mg total) by mouth at bedtime as needed.   latanoprost (XALATAN) 0.005 % ophthalmic solution Place 1 drop into both eyes at bedtime.    menthol-cetylpyridinium (CEPACOL) 3 MG lozenge Take 1 lozenge (3 mg total) by mouth as needed for sore throat.   mirtazapine (REMERON) 30 MG tablet TAKE 1 TABLET BY MOUTH EVERY NIGHT AT BEDTIME  mometasone (ELOCON) 0.1 % cream Apply 1 application. topically daily as needed (Rash). Qd up to 5 days a week to itchy bumps on body   Multiple Vitamin (MULTIVITAMIN) tablet Take 1 tablet by mouth daily.   mupirocin ointment (BACTROBAN) 2 % Apply 1 Application topically daily. Qd to excision site   Probiotic Product (DIGESTIVE ADVANTAGE PO) Take 1 tablet by mouth daily.   psyllium (METAMUCIL) 58.6 % powder Take 1 packet by mouth daily as needed (regularity).    rosuvastatin (CRESTOR) 20 MG tablet Take 1 tablet (20 mg total) by mouth daily.   tamsulosin (FLOMAX) 0.4 MG CAPS capsule Take 1 capsule (0.4 mg total) by mouth daily.   timolol (TIMOPTIC) 0.5 % ophthalmic solution Place 1 drop into both eyes daily.    tiZANidine (ZANAFLEX) 4 MG tablet TAKE ONE TABLET (4 MG TOTAL) BY MOUTH EVERY EIGHT (EIGHT) HOURS AS NEEDED FOR MUSCLE SPASMS.   traMADol (ULTRAM) 50 MG tablet TAKE ONE TABLET (50 MG TOTAL) BY MOUTH EVERY EIGHT (EIGHT) HOURS AS NEEDED. DO NOT TAKE WITH GABAPENTIN OR TIZANIDINE   No facility-administered encounter medications on file as of 05/31/2023.     Social History: Social History   Tobacco Use   Smoking status: Never   Smokeless tobacco: Never  Vaping Use   Vaping status: Never Used  Substance Use Topics   Alcohol use: No    Alcohol/week: 0.0 standard drinks of alcohol   Drug use: No    Family Medical History: Family History  Problem Relation Age of Onset   Depression Son    Diabetes Neg Hx    Cancer Neg Hx    Heart attack Neg Hx    Kidney disease Neg Hx    Prostate cancer Neg Hx    Kidney cancer Neg Hx    Bladder Cancer Neg Hx     Physical Examination: Today's Vitals   05/31/23 1030  BP: 134/80  Weight: 90.8 kg  Height: 6\' 3"  (1.905 m)  PainSc: 0-No pain  PainLoc: Back    Body mass index is 25.02 kg/m.   General: Patient is well developed, well nourished, calm, collected, and in no apparent distress. Attention to examination is appropriate.  Psychiatric: Patient is non-anxious.  Head:  Pupils equal, round, and reactive to light.  ENT:  Oral mucosa appears well hydrated.  Neck:   Supple.  Full range of motion.  Respiratory: Patient is breathing without any difficulty.  Extremities: No edema.  Vascular: Palpable dorsal pedal pulses.  Skin:   On exposed skin, there are no abnormal skin lesions.  NEUROLOGICAL:     Awake, alert, oriented to person, place, and time.  Speech is clear and fluent. Fund of knowledge is appropriate.   Cranial Nerves: Pupils equal round and reactive to light.  Facial tone is symmetric.   ROM of spine: full.  Palpation of spine: non tender.    Strength: Side Biceps Triceps Deltoid Interossei Grip Wrist Ext. Wrist Flex.  R 5 5 5 5 5 5 5   L 5 5 5 5 5 5 5    Side Iliopsoas Quads Hamstring PF DF EHL  R 5 5 5 5 4 5   L 5 5 5 5 5 5     Medical Decision Making  Imaging: 03/02/23 MRI L-spine   IMPRESSION: 1. 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 Associated intradural component as above. 2. Associated paraspinous inflammatory changes with left  psoas abscesses as above. 3. Underlying multilevel degenerative spondylosis and facet arthrosis, most pronounced at L4-5 where there is resultant moderate to severe spinal stenosis.     Electronically Signed   By: Rise Mu M.D.   On: 03/02/2023 21:19  02/08/23 MRI L-spine  FINDINGS: Segmentation: 5 non rib-bearing vertebral bodies. The lowest fully formed disc space is labeled L5-S1.   Alignment: 5 mm anterolisthesis of L4 on L5 unchanged from prior exam. 4 mm retrolisthesis of L3 on L4 appears new from the prior exam. Mild dextrocurvature.   Vertebrae: No acute fracture or suspicious osseous lesion. A small amount of enhancement is noted at the inferior aspect of T12, where a Schmorl's node appears to be forming, and about the L3-L4 disc space, which correlates with edema on the STIR and is favored to be related to adjacent degenerative changes. No evidence of discitis. Postsurgical changes at L3-L4 and L4-L5.   Conus medullaris and cauda equina: Conus extends to the L2 level. Conus and cauda equina appear normal. No abnormal enhancement. No epidural collection.   Paraspinal and other soft tissues: Negative.   Disc levels:   T12-L1: Minimal disc bulge. Mild facet arthropathy. No spinal canal stenosis or neural foraminal narrowing.   L1-L2: No significant disc bulge. Mild facet arthropathy. No spinal canal stenosis or neural foraminal narrowing.   L2-L3: Minimal disc bulge. Mild facet arthropathy. Ligamentum flavum hypertrophy. Mild spinal canal stenosis and mild bilateral neural foraminal narrowing, which have progressed from the prior exam. Narrowing of the lateral recesses.   L3-L4: Mild retrolisthesis with disc osteophyte complex. Moderate facet arthropathy. Interval laminectomy. No residual spinal canal stenosis. Narrowing of the left  lateral recess. Moderate left-greater-than-right neural foraminal narrowing, similar to prior   L4-L5: Status post fusion. Grade 1 anterolisthesis with disc unroofing. Severe facet arthropathy. Moderate spinal canal stenosis, which has progressed from the prior exam. Narrowing of the lateral recesses. No neural foraminal narrowing.   L5-S1: No significant disc bulge. Possible granulation tissue along the anterior aspect of the thecal sac versus extruded disc. Moderate to severe right and mild left facet arthropathy. Mild spinal canal stenosis. Narrowing of the lateral recesses. Moderate right neural foraminal narrowing, which has progressed from the prior exam   IMPRESSION: 1. No evidence of discitis or osteomyelitis. No epidural collection. 2. L4-L5 moderate spinal canal stenosis, which has progressed from the prior exam. 3. L5-S1 mild spinal canal stenosis and moderate right neural foraminal narrowing, which has progressed from the prior exam. 4. L3-L4 moderate left-greater-than-right neural foraminal narrowing. 5. L2-L3 mild spinal canal stenosis and mild bilateral neural foraminal narrowing. 6. Narrowing of the lateral recesses at L2-L3, L3-L4, L4-L5, and L5-S1 could affect the descending L3, L4, L5, and S1 nerve roots, respectively.     Electronically Signed   By: Wiliam Ke M.D.  I have personally reviewed the images and agree with the above interpretation.  Assessment and Plan: Mr. Aldaco is a pleasant 77 y.o. 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  Fortunately he has completed antibiotic therapy and is doing well without any ongoing back pain.  He has subjective weakness however his exam is largely normal aside from a chronic right foot weakness.  I encouraged him to continue with physical exercise as tolerated.  I offered him a referral to orthopedics for his left knee pain however he declined.  I will see him back in 3 to 4 months to ensure he is  still without significant pain and feels  that his lower extremity weakness is improving.  He was encouraged to call the office in the interim with any questions or concerns.  He expressed understanding and was in agreement with this plan.   I spent a total of 20 minutes in both face-to-face and non-face-to-face activities for this visit on the date of this encounter including review of records,, discussion of symptoms, physical exam, discussion of treatment options, and documentation.   [facility]

## 2023-06-07 ENCOUNTER — Other Ambulatory Visit: Payer: Medicare Other

## 2023-06-09 ENCOUNTER — Other Ambulatory Visit: Payer: Self-pay | Admitting: *Deleted

## 2023-06-09 ENCOUNTER — Ambulatory Visit
Admission: RE | Admit: 2023-06-09 | Discharge: 2023-06-09 | Disposition: A | Discharge status: Home / Self Care | Payer: Medicare Other | Source: Home / Self Care | Attending: Urology | Admitting: Urology

## 2023-06-09 ENCOUNTER — Ambulatory Visit
Admission: RE | Admit: 2023-06-09 | Discharge: 2023-06-09 | Disposition: A | Discharge status: Home / Self Care | Payer: Medicare Other | Source: Ambulatory Visit | Attending: Urology | Admitting: Urology

## 2023-06-09 DIAGNOSIS — N200 Calculus of kidney: Secondary | ICD-10-CM

## 2023-06-09 NOTE — Progress Notes (Unsigned)
06/10/2023 9:58 PM   Brett Bender 04-Aug-1946 [id number]  Referring provider: Malva Limes, MD [address] [place],  [place] [address]  Urological history: 1. 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 Nephrolithiasis -Stone composition was calcium oxalate dihydrate 30%, calcium oxalate monohydrate 60% and calcium phosphate 10% -[facility] results noted he was at increased risk for stone formation due to borderline hyperoxaluria, hypocitraturia, high urine pH and mild hyperurlcosuria.  He was given instruction on dietary changes and encouraged to drink 2.5 L of water daily -URS 2017 -CTU 2020 stones throughout the right kidney measuring up to 5 mm within the superior pole and 4 mm within the inferior pole. No ureterolithiasis. No hydronephrosis -KUB 2022 6 stones visible in the right kidney with largest 6 mm in size and 2 faint calcifications in the left  -CT renal stone study (02/2023) - Right greater than left nonobstructing nephrolithiasis. Stone burden within the right kidney has progressed slightly since prior examination. No urolithiasis. No hydronephrosis.   3. BPH with LU TS -PSA (02/2023) 0.1 -cysto 2020 enlarged prostate bilobar coaptation, 4 cm prosthetic length -tamsulosin 0.4 mg daily and finasteride 5 mg daily  Chief Complaint  Patient presents with   Nephrolithiasis   Benign Prostatic Hypertrophy   HPI: Brett Bender is a 77 y.o. male who presents today for yearly visit.   Previous records reviewed.  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  A CT renal stone study performed on 02/08/2023 revealed bilateral nephrolithiasis with stone burden slightly greater on the right than prior studies and a 19 mm left adrenal adenoma.   KUB 3 larger stone on the right with the largest 8 mm and two punctate stones on the left   UA (02/2023) negative for micro heme  I PSS 6/1  No urinary complaints.   Patient denies any modifying or aggravating factors.  Patient denies any recent UTI's, gross hematuria, dysuria or suprapubic/flank pain.  Patient denies any fevers, chills, nausea or vomiting.   IPSS     Row Name 06/10/23 0800         International Prostate Symptom Score   How often have you had the sensation of not emptying your bladder? Less than 1 in 5     How often have you had to urinate less than every two hours? Less than half the time     How often have you found you stopped and started again several times when you urinated? Not at All     How often have you found it difficult to postpone urination? Not at All     How often have you had a weak urinary stream? Less than half the time     How often have you had to strain to start urination? Not at All     How many times did you typically get up at night to urinate? 1 Time     Total IPSS Score 6       Quality of Life due to urinary symptoms   If you were to spend the rest of your life with your urinary condition just the way it is now how would you feel about that? Pleased              IPSS     Row Name 06/10/23 0800         International Prostate Symptom Score   How often have  you had the sensation of not emptying your bladder? Less than 1 in 5     How often have you had to urinate less than every two hours? Less than half the time     How often have you found you stopped and started again several times when you urinated? Not at All     How often have you found it difficult to postpone urination? Not at All     How often have you had a weak urinary stream? Less than half the time     How often have you had to strain to start urination? Not at All     How many times did you typically get up at night to urinate? 1 Time     Total IPSS Score 6       Quality of Life due to urinary symptoms   If you were to spend the rest of your life with your urinary condition just the way it is now how would you feel about that? Pleased               Score:  1-7 Mild 8-19 Moderate 20-35 Severe   PMH: Past Medical History:  Diagnosis Date   Actinic keratosis    Arthritis    Basal cell carcinoma 11/09/2013   R lateral edge of clavicle   Basal cell carcinoma 02/03/2016   L lateral supraclavicular base of neck/excision   Basal cell carcinoma 11/29/2018   L upper back   Basal cell carcinoma 09/11/2020   R mid back    Basal cell carcinoma 03/17/2023   right inferior post auricular, excised 04/19/23   Cancer (HCC)    Basal cell carcinoma bilateral arms and left shoulder   Complication of anesthesia    slow to wake up with shoulder surgery   Depression    Enlarged prostate    H/O elbow surgery    left   Herpes simplex    History of kidney stones    History of MRSA infection 2004   skin around eye   Hypertension    Kidney stone    Lumbar stenosis    Migraine headache    Motion sickness    ocean boats   PONV (postoperative nausea and vomiting)    Tendonitis of ankle, left     Surgical History: Past Surgical History:  Procedure Laterality Date   APPENDECTOMY     back injection     BACK SURGERY     CYSTOSCOPY/URETEROSCOPY/HOLMIUM LASER/STENT PLACEMENT Left 12/18/2015   Procedure: CYSTOSCOPY/URETEROSCOPY/HOLMIUM LASER LITHOTRIPSY;  Surgeon: Vanna Scotland, MD;  Location: [facility];  Service: Urology;  Laterality: Left;   ELBOW SURGERY     IR LUMBAR DISC ASPIRATION W/IMG GUIDE  03/03/2023   LOWER BACK SURGERY  2010   L4-5 fusion   LUMBAR LAMINECTOMY/DECOMPRESSION MICRODISCECTOMY N/A 08/21/2018   Procedure: LUMBAR LAMINECTOMY/DECOMPRESSION MICRODISCECTOMY 1 LEVEL-L3/4;  Surgeon: Lucy Chris, MD;  Location: [facility];  Service: Neurosurgery;  Laterality: N/A;   NECK SURGERY     Upper neck surgery; with wires placed   SHOULDER SURGERY Right 11/10/2011   arthroscopic surgery . Dr. Katrinka Blazing, [facility]   TEE WITHOUT CARDIOVERSION N/A 02/11/2023   Procedure: TRANSESOPHAGEAL ECHOCARDIOGRAM;  Surgeon: Debbe Odea, MD;  Location: [facility];  Service: Cardiovascular;  Laterality: N/A;   TONSILLECTOMY     TRICEPS TENDON REPAIR Left 12/04/2019   Procedure: TRICEPS TENDON REPAIR, OLECRANON BURSA;  Surgeon: Signa Kell, MD;  Location: [facility];  Service: Orthopedics;  Laterality: Left;    Home Medications:  Allergies as of 06/10/2023       Reactions   Penicillins Other (See Comments)   Stomach Ache        Medication List        Accurate as of June 10, 2023 11:59 PM. If you have any questions, ask your nurse or doctor.          amLODipine 10 MG tablet Commonly known as: NORVASC TAKE ONE TABLET BY MOUTH ONCE A DAY   aspirin 81 MG chewable tablet Chew by mouth daily.   cefadroxil 500 MG capsule Commonly known as: DURICEF Take 2 capsules (1,000 mg total) by mouth 2 (two) times daily.   diclofenac 50 MG EC tablet Commonly known as: VOLTAREN TAKE ONE TABLET BY MOUTH TWICE DAILY AS NEEDED   diclofenac Sodium 1 % Gel Commonly known as: VOLTAREN APPLY 4G TOPICALLY FIVE TIMES DAILY   DIGESTIVE ADVANTAGE PO Take 1 tablet by mouth daily.   fexofenadine 180 MG tablet Commonly known as: Allegra Allergy Take 1 tablet (180 mg total) by mouth daily.   finasteride 5 MG tablet Commonly known as: PROSCAR Take 1 tablet (5 mg total) by mouth daily.   gabapentin 300 MG capsule Commonly known as: NEURONTIN Take 1 capsule (300 mg total) by mouth at bedtime as needed.   latanoprost 0.005 % ophthalmic solution Commonly known as: XALATAN Place 1 drop into both eyes at bedtime.   menthol-cetylpyridinium 3 MG lozenge Commonly known as: CEPACOL Take 1 lozenge (3 mg total) by mouth as needed for sore throat.   mirtazapine 30 MG tablet Commonly known as: REMERON TAKE 1 TABLET BY MOUTH EVERY NIGHT AT BEDTIME   mometasone 0.1 % cream Commonly known as: ELOCON Apply 1 application. topically daily as needed (Rash). Qd up to 5 days a week to itchy bumps on body   multivitamin  tablet Take 1 tablet by mouth daily.   mupirocin ointment 2 % Commonly known as: BACTROBAN Apply 1 Application topically daily. Qd to excision site   psyllium 58.6 % powder Commonly known as: METAMUCIL Take 1 packet by mouth daily as needed (regularity).   rosuvastatin 20 MG tablet Commonly known as: Crestor Take 1 tablet (20 mg total) by mouth daily.   tamsulosin 0.4 MG Caps capsule Commonly known as: FLOMAX Take 1 capsule (0.4 mg total) by mouth daily.   timolol 0.5 % ophthalmic solution Commonly known as: TIMOPTIC Place 1 drop into both eyes daily.   tiZANidine 4 MG tablet Commonly known as: ZANAFLEX TAKE ONE TABLET (4 MG TOTAL) BY MOUTH EVERY EIGHT (EIGHT) HOURS AS NEEDED FOR MUSCLE SPASMS.   traMADol 50 MG tablet Commonly known as: ULTRAM TAKE ONE TABLET (50 MG TOTAL) BY MOUTH EVERY EIGHT (EIGHT) HOURS AS NEEDED. DO NOT TAKE WITH GABAPENTIN OR TIZANIDINE        Allergies:  Allergies  Allergen Reactions   Penicillins Other (See Comments)    Stomach Ache    Family History: Family History  Problem Relation Age of Onset   Depression Son    Diabetes Neg Hx    Cancer Neg Hx    Heart attack Neg Hx    Kidney disease Neg Hx    Prostate cancer Neg Hx    Kidney cancer Neg Hx    Bladder Cancer Neg Hx     Social History:  reports that he has never smoked. He has never used smokeless tobacco. He reports that he does not drink alcohol  and does not use drugs.  ROS: Pertinent ROS in HPI  Physical Exam: BP 99/65   Pulse (!) 56   Wt 200 lb 14.4 oz (91.1 kg)   BMI 25.11 kg/m   Constitutional:  Well nourished. Alert and oriented, No acute distress. HEENT: [place] AT, moist mucus membranes.  Trachea midline Cardiovascular: No clubbing, cyanosis, or edema. Respiratory: Normal respiratory effort, no increased work of breathing. GU: No CVA tenderness.  No bladder fullness or masses.  Patient with circumcised phallus.  Urethral meatus is patent.  No penile discharge. No  penile lesions or rashes. Scrotum without lesions, cysts, rashes and/or edema.  Testicles are located scrotally bilaterally. No masses are appreciated in the testicles. Left and right epididymis are normal. Rectal: Patient with  normal sphincter tone. Anus and perineum without scarring or rashes. No rectal masses are appreciated. Prostate is approximately 35 grams, no nodules are appreciated. Seminal vesicles could not be palpated.  Neurologic: Grossly intact, no focal deficits, moving all 4 extremities. Psychiatric: Normal mood and affect.  Laboratory Data: Component     Latest Ref Rng 02/23/2023 Prostate Specific Ag, Serum     0.0 - 4.0 ng/mL 0.1   Lab Results  Component Value Date   WBC 12.3 (H) 03/08/2023   HGB 12.1 (L) 03/08/2023   HCT 36.3 (L) 03/08/2023   MCV 91.9 03/08/2023   PLT 284 03/08/2023   Lab Results  Component Value Date   CREATININE 0.89 05/11/2023   Lab Results  Component Value Date   HGBA1C 5.8 (H) 02/23/2023   Lab Results  Component Value Date   TSH 1.330 02/23/2023      Component Value Date/Time   CHOL 111 05/11/2023 1018   HDL 46 05/11/2023 1018   CHOLHDL 3.9 02/23/2023 1103   LDLCALC 43 05/11/2023 1018   Lab Results  Component Value Date   AST 26 05/11/2023   Lab Results  Component Value Date   ALT 24 05/11/2023    Urinalysis Component     Latest Ref Rng 02/07/2023 Color, Urine     YELLOW  AMBER !  Appearance     CLEAR  HAZY !  Specific Gravity, Urine     1.005 - 1.030  1.026  pH     5.0 - 8.0  5.0  Glucose, UA     NEGATIVE mg/dL NEGATIVE  Hgb urine dipstick     NEGATIVE  NEGATIVE  Bilirubin Urine     NEGATIVE  NEGATIVE  Ketones, ur     NEGATIVE mg/dL NEGATIVE  Protein     NEGATIVE mg/dL 161 !  Nitrite     NEGATIVE  NEGATIVE  WBC, UA     0 - 5 WBC/hpf 0-5  WBC, UA      0-5  Bacteria, UA     NONE SEEN  RARE !  Bacteria, UA      None seen  Leukocytes,Ua     NEGATIVE  NEGATIVE  RBC / HPF     0 - 5  RBC/hpf 0-5  Squamous Epithelial / HPF     0 - 5 /HPF 0-5  Mucus PRESENT  Specimen Source URINE, CLEAN CATCH  Hyaline Casts, UA PRESENT    Legend: ! Abnormal  I have reviewed the labs.  Pertinent Imaging: CLINICAL DATA:  Nephrolithiasis.   EXAM: ABDOMEN - 1 VIEW   COMPARISON:  June 09, 2022.   FINDINGS: The bowel gas pattern is normal. Multiple right renal calculi are noted which are not significantly changed  compared to prior exam. No definite left-sided nephrolithiasis is noted at this time.   IMPRESSION: Stable right nephrolithiasis.     Electronically Signed   By: Lupita Raider M.D.   On: 06/15/2023 20:50   I have independently reviewed the films.    Assessment & Plan:    1. Nephrolithiasis -KUB bilateral nephrolithiasis -CT in April shows increased stone burden on the right -asymptomatic -continue to monitor  2. BPH with LUTS -PSA stable  -DRE benign  -UA benign  -continue conservative management, avoiding bladder irritants and timed voiding's -Continue finasteride 5 mg daily  3. High risk hematuria -non -smoker -work up in 2020 - NED -no reports of gross heme -UA (02/2023) - w/o micro heme  4.  Left adrenal adenoma -Explained that it is likely not a cancerous adenoma but it cannot be metabolically active and so I will place referral for endocrinology to evaluate for this    Return in about 1 year (around 06/09/2024) for KUB, UA, I PSS, PSA and exam .  These notes generated with voice recognition software. I apologize for typographical errors.  Cloretta Ned  [facility] [address]  [address] [place], [place] [address] [phone]

## 2023-06-10 ENCOUNTER — Encounter: Payer: Self-pay | Admitting: Urology

## 2023-06-10 ENCOUNTER — Ambulatory Visit (INDEPENDENT_AMBULATORY_CARE_PROVIDER_SITE_OTHER): Payer: Medicare Other | Admitting: Urology

## 2023-06-10 VITALS — BP 99/65 | HR 56 | Wt 200.9 lb

## 2023-06-10 DIAGNOSIS — N200 Calculus of kidney: Secondary | ICD-10-CM

## 2023-06-10 DIAGNOSIS — N401 Enlarged prostate with lower urinary tract symptoms: Secondary | ICD-10-CM

## 2023-06-10 DIAGNOSIS — D3502 Benign neoplasm of left adrenal gland: Secondary | ICD-10-CM | POA: Diagnosis not present

## 2023-06-10 DIAGNOSIS — R319 Hematuria, unspecified: Secondary | ICD-10-CM

## 2023-06-10 DIAGNOSIS — N138 Other obstructive and reflux uropathy: Secondary | ICD-10-CM

## 2023-06-10 MED ORDER — TAMSULOSIN HCL 0.4 MG PO CAPS: 0.4000 mg | ORAL_CAPSULE | Freq: Every day | ORAL | 3 refills | Status: DC

## 2023-06-10 MED ORDER — FINASTERIDE 5 MG PO TABS
5.0000 mg | ORAL_TABLET | Freq: Every day | ORAL | 3 refills | Status: DC
Start: 2023-06-10 — End: 2024-07-23

## 2023-06-17 ENCOUNTER — Ambulatory Visit: Payer: Self-pay | Admitting: *Deleted

## 2023-06-17 NOTE — Patient Outreach (Signed)
  Care Coordination   06/17/2023 Name: Brett Bender MRN: [id number] DOB: [birth year]   Care Coordination Outreach Attempts:  An unsuccessful telephone outreach was attempted for a scheduled appointment today.  Follow Up Plan:  Additional outreach attempts will be made to offer the patient care coordination information and services.   Encounter Outcome:  No Answer   Care Coordination Interventions:  No, not indicated    Kemper Durie, RN, MSN, [facility] Care Management Care Management Coordinator [phone]

## 2023-06-17 NOTE — Patient Outreach (Signed)
  Care Coordination   Follow Up Visit Note   06/17/2023 Name: Brett Bender MRN: [id number] DOB: 1946-01-14  Brett Bender is a 77 y.o. year old male who sees Fisher, Demetrios Isaacs, MD for primary care. I spoke with  Brett Bender by phone today.  What matters to the patients health and wellness today?  Increase strength and endurance    Goals Addressed             This Visit's Progress    Effective treatment of infection   On track    Care Coordination Interventions: Evaluation of current treatment plan related to epidural abscess and patient's adherence to plan as established by provider Advised patient to look for signs of recurrent infection Discussed plans with patient for ongoing care management follow up and provided patient with direct contact information for care management team         SDOH assessments and interventions completed:  No     Care Coordination Interventions:  Yes, provided   Interventions Today    Flowsheet Row Most Recent Value  Chronic Disease   Chronic disease during today's visit Other  [epidural abscess, infection improved]  General Interventions   General Interventions Discussed/Reviewed General Interventions Reviewed, Doctor Visits  Doctor Visits Discussed/Reviewed Doctor Visits Reviewed, Specialist  [Neurosurgery in November, will consider having referral to ortho for knee pain]  PCP/Specialist Visits Compliance with follow-up visit  Exercise Interventions   Exercise Discussed/Reviewed Physical Activity  Physical Activity Discussed/Reviewed Gym  [Working on increasing lower extermity strength at the gym]  Education Interventions   Education Provided Provided Education  Provided Verbal Education On Other, When to see the doctor, Medication  [Discussed pain management, minimal back pain, has some knee pain.  Feels this will resolve with increased activity]        Follow up plan: Follow up call scheduled for 9/26    Encounter Outcome:   Pt. Visit Completed   Kemper Durie, RN, MSN, [facility] Care Management Care Management Coordinator [phone]

## 2023-06-20 ENCOUNTER — Telehealth: Payer: Self-pay

## 2023-06-20 DIAGNOSIS — M5417 Radiculopathy, lumbosacral region: Secondary | ICD-10-CM

## 2023-06-20 DIAGNOSIS — M5416 Radiculopathy, lumbar region: Secondary | ICD-10-CM

## 2023-06-20 NOTE — Telephone Encounter (Signed)
Copied from CRM [id number]. Topic: Referral - Request for Referral >> Jun 20, 2023  8:08 AM Franchot Heidelberg wrote: Has patient seen PCP for this complaint? Yes.   *If NO, is insurance requiring patient see PCP for this issue before PCP can refer them? Referral for which specialty: Physical Therapy  Preferred provider/office: [facility]  Reason for referral: Has a limp, hip pain, spinal pain, leg pain, not walking straight. Says he has discussed this with PCP

## 2023-06-20 NOTE — Addendum Note (Signed)
Addended by: Malva Limes on: 06/20/2023 05:13 PM   Modules accepted: Orders

## 2023-06-20 NOTE — Telephone Encounter (Signed)
Order placed

## 2023-06-23 ENCOUNTER — Other Ambulatory Visit: Payer: Self-pay | Admitting: Physician Assistant

## 2023-06-23 DIAGNOSIS — G4700 Insomnia, unspecified: Secondary | ICD-10-CM

## 2023-06-23 DIAGNOSIS — F32A Depression, unspecified: Secondary | ICD-10-CM

## 2023-06-24 NOTE — Telephone Encounter (Signed)
Requested Prescriptions  Pending Prescriptions Disp Refills   mirtazapine (REMERON) 30 MG tablet [Pharmacy Med Name: MIRTAZAPINE 30MG  TABLET] 90 tablet 1    Sig: TAKE ONE TABLET BY MOUTH EVERY NIGHT AT BEDTIME     Psychiatry: Antidepressants - mirtazapine Passed - 06/23/2023  9:10 AM      Passed - Completed PHQ-2 or PHQ-9 in the last 360 days      Passed - Valid encounter within last 6 months    Recent Outpatient Visits           1 month ago Elevated lipids   [facility] Malva Limes, MD   3 months ago Lumbar radiculopathy   [facility] Malva Limes, MD   3 months ago Lumbar radiculopathy   [facility] [name], PA-C   4 months ago Hospital discharge follow-up   [facility] Merita Norton T, FNP   4 months ago Lumbar radiculopathy   [facility] Malva Limes, MD       Future Appointments             In 2 months Deirdre Evener, MD [facility]   In 11 months McGowan, Elana Alm [facility] [place]

## 2023-07-01 ENCOUNTER — Encounter: Payer: Self-pay | Admitting: Family Medicine

## 2023-07-01 ENCOUNTER — Ambulatory Visit: Payer: Self-pay | Admitting: *Deleted

## 2023-07-01 ENCOUNTER — Telehealth: Payer: Medicare Other | Admitting: Family Medicine

## 2023-07-01 DIAGNOSIS — U071 COVID-19: Secondary | ICD-10-CM

## 2023-07-01 MED ORDER — NIRMATRELVIR/RITONAVIR (PAXLOVID)TABLET: 3.0000 | ORAL_TABLET | Freq: Two times a day (BID) | ORAL | 0 refills | Status: AC

## 2023-07-01 MED ORDER — BENZONATATE 100 MG PO CAPS
100.0000 mg | ORAL_CAPSULE | Freq: Three times a day (TID) | ORAL | 0 refills | Status: DC | PRN
Start: 2023-07-01 — End: 2023-09-20

## 2023-07-01 NOTE — Telephone Encounter (Signed)
Scheduled patient with Dr. Payton Mccallum.

## 2023-07-01 NOTE — Telephone Encounter (Signed)
  Chief Complaint: covid positive at home test this am , requesting medication "paxlovid" Symptoms: "head cold", cough , nasal stuffiness, hoarse voice.  Frequency: 2 days  Wednesday Pertinent Negatives: Patient denies chest pain no difficulty breathing no fever Disposition: [] ED /[] Urgent Care (no appt availability in office) / [] Appointment(In office/virtual)/ []  [facility]/ [] Home Care/ [x] Refused Recommended Disposition /[]  Mobile Bus/ []  Follow-up with PCP Additional Notes:   No available appt or VV appt at practice. Offered UC VV and patient is concerned he doesn't know how to do computer for VV. Please advise regarding medication and appt. Patient would like a call back.      Reason for Disposition  [1] HIGH RISK patient (e.g., weak immune system, age > 64 years, obesity with BMI 30 or higher, pregnant, chronic lung disease or other chronic medical condition) AND [2] COVID symptoms (e.g., cough, fever)  (Exceptions: Already seen by PCP and no new or worsening symptoms.)  Answer Assessment - Initial Assessment Questions 1. COVID-19 DIAGNOSIS: "How do you know that you have COVID?" (e.g., positive lab test or self-test, diagnosed by doctor or NP/PA, symptoms after exposure).     At home test positive covid  2. COVID-19 EXPOSURE: "Was there any known exposure to COVID before the symptoms began?" CDC Definition of close contact: within 6 feet (2 meters) for a total of 15 minutes or more over a 24-hour period.      na 3. ONSET: "When did the COVID-19 symptoms start?"      2 days on Wednesday  4. WORST SYMPTOM: "What is your worst symptom?" (e.g., cough, fever, shortness of breath, muscle aches)     Cough nose stopped up ,  5. COUGH: "Do you have a cough?" If Yes, ask: "How bad is the cough?"       Yes  6. FEVER: "Do you have a fever?" If Yes, ask: "What is your temperature, how was it measured, and when did it start?"     no 7. RESPIRATORY STATUS: "Describe your  breathing?" (e.g., normal; shortness of breath, wheezing, unable to speak)      denies 8. BETTER-SAME-WORSE: "Are you getting better, staying the same or getting worse compared to yesterday?"  If getting worse, ask, "In what way?"     Na  9. OTHER SYMPTOMS: "Do you have any other symptoms?"  (e.g., chills, fatigue, headache, loss of smell or taste, muscle pain, sore throat)     Cough, "head cold" nasal stuffiness 10. HIGH RISK DISEASE: "Do you have any chronic medical problems?" (e.g., asthma, heart or lung disease, weak immune system, obesity, etc.)       Na  11. VACCINE: "Have you had the COVID-19 vaccine?" If Yes, ask: "Which one, how many shots, when did you get it?"       Yes  12. PREGNANCY: "Is there any chance you are pregnant?" "When was your last menstrual period?"       na 13. O2 SATURATION MONITOR:  "Do you use an oxygen saturation monitor (pulse oximeter) at home?" If Yes, ask "What is your reading (oxygen level) today?" "What is your usual oxygen saturation reading?" (e.g., 95%)       na  Protocols used: Coronavirus (COVID-19) Diagnosed or Suspected-A-AH

## 2023-07-01 NOTE — Progress Notes (Signed)
MyChart Video Visit    Virtual Visit via Video Note   This format is felt to be most appropriate for this patient at this time. Physical exam was limited by quality of the video and audio technology used for the visit.   Patient location: Home Provider location: [facility]  I discussed the limitations of evaluation and management by telemedicine and the availability of in person appointments. The patient expressed understanding and agreed to proceed.  Patient: Brett Bender   DOB: [birth year]   77 y.o. Male  MRN: [id number] Visit Date: 07/01/2023  Today's healthcare provider: Sherlyn Hay, DO   Chief Complaint  Patient presents with   Covid Positive    Patient reports testing positive this morning.    Subjective    HPI HPI     Covid Positive    Additional comments: Patient reports testing positive this morning.       Last edited by Marjie Skiff, CMA on 07/01/2023 10:04 AM.      Tuesday - started getting aching, then sinus/head congestion and drainage with related cough.  Has been taking mucinex bid which seems to help Thinks he had covid vaccines initially; no boosters. A friend of his had covid recently.   Medications: Outpatient Medications Prior to Visit  Medication Sig   amLODipine (NORVASC) 10 MG tablet TAKE ONE TABLET BY MOUTH ONCE A DAY   aspirin 81 MG chewable tablet Chew by mouth daily.   diclofenac (VOLTAREN) 50 MG EC tablet TAKE ONE TABLET BY MOUTH TWICE DAILY AS NEEDED   diclofenac Sodium (VOLTAREN) 1 % GEL APPLY 4G TOPICALLY FIVE TIMES DAILY   finasteride (PROSCAR) 5 MG tablet Take 1 tablet (5 mg total) by mouth daily.   latanoprost (XALATAN) 0.005 % ophthalmic solution Place 1 drop into both eyes at bedtime.    Multiple Vitamin (MULTIVITAMIN) tablet Take 1 tablet by mouth daily.   Probiotic Product (DIGESTIVE ADVANTAGE PO) Take 1 tablet by mouth daily.   psyllium (METAMUCIL) 58.6 % powder Take 1 packet by mouth daily as  needed (regularity).    rosuvastatin (CRESTOR) 20 MG tablet Take 1 tablet (20 mg total) by mouth daily.   tamsulosin (FLOMAX) 0.4 MG CAPS capsule Take 1 capsule (0.4 mg total) by mouth daily.   timolol (TIMOPTIC) 0.5 % ophthalmic solution Place 1 drop into both eyes daily.    traMADol (ULTRAM) 50 MG tablet TAKE ONE TABLET (50 MG TOTAL) BY MOUTH EVERY EIGHT (EIGHT) HOURS AS NEEDED. DO NOT TAKE WITH GABAPENTIN OR TIZANIDINE   mirtazapine (REMERON) 30 MG tablet TAKE ONE TABLET BY MOUTH EVERY NIGHT AT BEDTIME   mometasone (ELOCON) 0.1 % cream Apply 1 application. topically daily as needed (Rash). Qd up to 5 days a week to itchy bumps on body   mupirocin ointment (BACTROBAN) 2 % Apply 1 Application topically daily. Qd to excision site   [DISCONTINUED] cefadroxil (DURICEF) 500 MG capsule Take 2 capsules (1,000 mg total) by mouth 2 (two) times daily. (Patient not taking: Reported on 07/01/2023)   [DISCONTINUED] fexofenadine (ALLEGRA ALLERGY) 180 MG tablet Take 1 tablet (180 mg total) by mouth daily. (Patient not taking: Reported on 07/01/2023)   [DISCONTINUED] gabapentin (NEURONTIN) 300 MG capsule Take 1 capsule (300 mg total) by mouth at bedtime as needed. (Patient not taking: Reported on 07/01/2023)   [DISCONTINUED] menthol-cetylpyridinium (CEPACOL) 3 MG lozenge Take 1 lozenge (3 mg total) by mouth as needed for sore throat. (Patient not taking: Reported on 07/01/2023)   [  DISCONTINUED] tiZANidine (ZANAFLEX) 4 MG tablet TAKE ONE TABLET (4 MG TOTAL) BY MOUTH EVERY EIGHT (EIGHT) HOURS AS NEEDED FOR MUSCLE SPASMS. (Patient not taking: Reported on 07/01/2023)   No facility-administered medications prior to visit.    Review of Systems  Constitutional:  Negative for appetite change, chills and fever.  HENT:  Positive for congestion, postnasal drip and sinus pressure.   Respiratory:  Positive for cough. Negative for chest tightness, shortness of breath and wheezing.   Cardiovascular:  Negative for chest pain and  palpitations.  Gastrointestinal:  Negative for abdominal pain, nausea and vomiting.  Neurological:  Negative for dizziness and headaches.        Objective    There were no vitals taken for this visit.      Physical Exam Constitutional:      General: He is not in acute distress.    Appearance: Normal appearance. He is not diaphoretic.  HENT:     Head: Normocephalic.  Eyes:     Conjunctiva/sclera: Conjunctivae normal.  Pulmonary:     Effort: Pulmonary effort is normal. No respiratory distress.  Neurological:     Mental Status: He is alert and oriented to person, place, and time. Mental status is at baseline.        Assessment & Plan    Positive self-administered antigen test for COVID-19 -     nirmatrelvir/ritonavir; Take 3 tablets by mouth 2 (two) times daily for 5 days. (Take nirmatrelvir 150 mg two tablets twice daily for 5 days and ritonavir 100 mg one tablet twice daily for 5 days) Patient GFR is 89  Dispense: 30 tablet; Refill: 0 -     Benzonatate; Take 1 capsule (100 mg total) by mouth 3 (three) times daily as needed for cough.  Dispense: 20 capsule; Refill: 0  Patient continues to be recovering from an acute bacterial infection he had in April.  Between that, his advanced age and other comorbidities, we will go ahead and prescribe Paxlovid as noted.  Also prescribed benzonatate for cough as needed.  Advised patient to continue supportive measures.  Return if symptoms worsen or fail to improve.     I discussed the assessment and treatment plan with the patient. The patient was provided an opportunity to ask questions and all were answered. The patient agreed with the plan and demonstrated an understanding of the instructions.   The patient was advised to call back or seek an in-person evaluation if the symptoms worsen or if the condition fails to improve as anticipated.  I provided 8 minutes of virtual-face-to-face time during this encounter.  I discussed the  assessment and treatment plan with the patient  The patient was provided an opportunity to ask questions and all were answered. The patient agreed with the plan and demonstrated an understanding of the instructions.   The patient was advised to call back or seek an in-person evaluation if the symptoms worsen or if the condition fails to improve as anticipated.   Sherlyn Hay, DO [facility] [place] [phone] (phone) [phone] (fax)  [facility]

## 2023-07-18 DIAGNOSIS — M6281 Muscle weakness (generalized): Secondary | ICD-10-CM | POA: Diagnosis not present

## 2023-07-18 DIAGNOSIS — R2681 Unsteadiness on feet: Secondary | ICD-10-CM | POA: Diagnosis not present

## 2023-07-18 DIAGNOSIS — R262 Difficulty in walking, not elsewhere classified: Secondary | ICD-10-CM | POA: Diagnosis not present

## 2023-07-18 DIAGNOSIS — M5441 Lumbago with sciatica, right side: Secondary | ICD-10-CM | POA: Diagnosis not present

## 2023-07-20 DIAGNOSIS — M6281 Muscle weakness (generalized): Secondary | ICD-10-CM | POA: Diagnosis not present

## 2023-07-20 DIAGNOSIS — R2681 Unsteadiness on feet: Secondary | ICD-10-CM | POA: Diagnosis not present

## 2023-07-20 DIAGNOSIS — R262 Difficulty in walking, not elsewhere classified: Secondary | ICD-10-CM | POA: Diagnosis not present

## 2023-07-20 DIAGNOSIS — M5441 Lumbago with sciatica, right side: Secondary | ICD-10-CM | POA: Diagnosis not present

## 2023-07-25 DIAGNOSIS — R2681 Unsteadiness on feet: Secondary | ICD-10-CM | POA: Diagnosis not present

## 2023-07-25 DIAGNOSIS — M5441 Lumbago with sciatica, right side: Secondary | ICD-10-CM | POA: Diagnosis not present

## 2023-07-25 DIAGNOSIS — M6281 Muscle weakness (generalized): Secondary | ICD-10-CM | POA: Diagnosis not present

## 2023-07-25 DIAGNOSIS — R262 Difficulty in walking, not elsewhere classified: Secondary | ICD-10-CM | POA: Diagnosis not present

## 2023-07-27 DIAGNOSIS — M6281 Muscle weakness (generalized): Secondary | ICD-10-CM | POA: Diagnosis not present

## 2023-07-27 DIAGNOSIS — M5441 Lumbago with sciatica, right side: Secondary | ICD-10-CM | POA: Diagnosis not present

## 2023-07-27 DIAGNOSIS — R262 Difficulty in walking, not elsewhere classified: Secondary | ICD-10-CM | POA: Diagnosis not present

## 2023-07-27 DIAGNOSIS — R2681 Unsteadiness on feet: Secondary | ICD-10-CM | POA: Diagnosis not present

## 2023-07-29 ENCOUNTER — Other Ambulatory Visit: Payer: Self-pay | Admitting: Family Medicine

## 2023-08-01 DIAGNOSIS — R262 Difficulty in walking, not elsewhere classified: Secondary | ICD-10-CM | POA: Diagnosis not present

## 2023-08-01 DIAGNOSIS — M6281 Muscle weakness (generalized): Secondary | ICD-10-CM | POA: Diagnosis not present

## 2023-08-01 DIAGNOSIS — M5441 Lumbago with sciatica, right side: Secondary | ICD-10-CM | POA: Diagnosis not present

## 2023-08-01 DIAGNOSIS — R2681 Unsteadiness on feet: Secondary | ICD-10-CM | POA: Diagnosis not present

## 2023-08-02 ENCOUNTER — Ambulatory Visit: Payer: Self-pay | Admitting: *Deleted

## 2023-08-02 NOTE — Patient Outreach (Signed)
Care Coordination   Follow Up Visit Note   08/02/2023 Name: Brett Bender MRN: [id number] DOB: 03-Oct-1946  Brett Bender is a 77 y.o. year old male who sees Fisher, Demetrios Isaacs, MD for primary care. I spoke with  [name] by phone today.  What matters to the patients health and wellness today?  Report he was trying to hold off from taking PT, working independently at the gym, but has not started outpatient PT at [facility].  State he is feeling better, will continue sessions.  Denies any urgent concerns, encouraged to contact this care manager with questions.      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        SDOH assessments and interventions completed:  No     Care Coordination Interventions:  Yes, provided   Interventions Today    Flowsheet Row Most Recent Value  Chronic Disease   Chronic disease during today's visit Other  [decreased leg strength]  General Interventions   General Interventions Discussed/Reviewed General Interventions Reviewed, Doctor Visits, Vaccines  Vaccines Flu  [will call to schedule flu shot with PCP office]  Doctor Visits Discussed/Reviewed Doctor Visits Reviewed, PCP, Specialist  [neurosurgery 11/12]  PCP/Specialist Visits Compliance with follow-up visit  Education Interventions   Education Provided Provided Education  Provided Verbal Education On When to see the doctor  Safety Interventions   Safety Discussed/Reviewed Fall Risk, Safety Reviewed  [Has started outpatient PT for leg strength]        Follow up plan: Follow up call scheduled for 11/13    Encounter Outcome:  Patient Visit Completed   Kemper Durie, RN, MSN, [facility] Care Management Care Management Coordinator [phone]

## 2023-08-02 NOTE — Patient Outreach (Signed)
Care Coordination   08/02/2023 Name: Brett Bender MRN: [id number] DOB: 1946/05/19   Care Coordination Outreach Attempts:  An unsuccessful telephone outreach was attempted for a scheduled appointment today.  Follow Up Plan:  Additional outreach attempts will be made to offer the patient care coordination information and services.   Encounter Outcome:  No Answer   Care Coordination Interventions:  No, not indicated    Kemper Durie, RN, MSN, [facility] Care Management Care Management Coordinator [phone]

## 2023-08-03 DIAGNOSIS — M6281 Muscle weakness (generalized): Secondary | ICD-10-CM | POA: Diagnosis not present

## 2023-08-03 DIAGNOSIS — M5441 Lumbago with sciatica, right side: Secondary | ICD-10-CM | POA: Diagnosis not present

## 2023-08-03 DIAGNOSIS — R2681 Unsteadiness on feet: Secondary | ICD-10-CM | POA: Diagnosis not present

## 2023-08-03 DIAGNOSIS — R262 Difficulty in walking, not elsewhere classified: Secondary | ICD-10-CM | POA: Diagnosis not present

## 2023-08-08 DIAGNOSIS — M6281 Muscle weakness (generalized): Secondary | ICD-10-CM | POA: Diagnosis not present

## 2023-08-08 DIAGNOSIS — R2681 Unsteadiness on feet: Secondary | ICD-10-CM | POA: Diagnosis not present

## 2023-08-08 DIAGNOSIS — M5441 Lumbago with sciatica, right side: Secondary | ICD-10-CM | POA: Diagnosis not present

## 2023-08-08 DIAGNOSIS — R262 Difficulty in walking, not elsewhere classified: Secondary | ICD-10-CM | POA: Diagnosis not present

## 2023-08-10 DIAGNOSIS — R2681 Unsteadiness on feet: Secondary | ICD-10-CM | POA: Diagnosis not present

## 2023-08-10 DIAGNOSIS — M6281 Muscle weakness (generalized): Secondary | ICD-10-CM | POA: Diagnosis not present

## 2023-08-10 DIAGNOSIS — R262 Difficulty in walking, not elsewhere classified: Secondary | ICD-10-CM | POA: Diagnosis not present

## 2023-08-10 DIAGNOSIS — M5441 Lumbago with sciatica, right side: Secondary | ICD-10-CM | POA: Diagnosis not present

## 2023-08-15 DIAGNOSIS — Z23 Encounter for immunization: Secondary | ICD-10-CM | POA: Diagnosis not present

## 2023-09-15 ENCOUNTER — Ambulatory Visit: Payer: Medicare Other | Admitting: Dermatology

## 2023-09-15 ENCOUNTER — Encounter: Payer: Self-pay | Admitting: Dermatology

## 2023-09-15 DIAGNOSIS — L578 Other skin changes due to chronic exposure to nonionizing radiation: Secondary | ICD-10-CM | POA: Diagnosis not present

## 2023-09-15 DIAGNOSIS — Z85828 Personal history of other malignant neoplasm of skin: Secondary | ICD-10-CM

## 2023-09-15 DIAGNOSIS — L820 Inflamed seborrheic keratosis: Secondary | ICD-10-CM | POA: Diagnosis not present

## 2023-09-15 DIAGNOSIS — Z872 Personal history of diseases of the skin and subcutaneous tissue: Secondary | ICD-10-CM

## 2023-09-15 DIAGNOSIS — C44619 Basal cell carcinoma of skin of left upper limb, including shoulder: Secondary | ICD-10-CM

## 2023-09-15 DIAGNOSIS — W908XXA Exposure to other nonionizing radiation, initial encounter: Secondary | ICD-10-CM

## 2023-09-15 DIAGNOSIS — L570 Actinic keratosis: Secondary | ICD-10-CM

## 2023-09-15 DIAGNOSIS — L814 Other melanin hyperpigmentation: Secondary | ICD-10-CM

## 2023-09-15 DIAGNOSIS — Z79899 Other long term (current) drug therapy: Secondary | ICD-10-CM

## 2023-09-15 DIAGNOSIS — Z1283 Encounter for screening for malignant neoplasm of skin: Secondary | ICD-10-CM | POA: Diagnosis not present

## 2023-09-15 DIAGNOSIS — D229 Melanocytic nevi, unspecified: Secondary | ICD-10-CM

## 2023-09-15 DIAGNOSIS — D1801 Hemangioma of skin and subcutaneous tissue: Secondary | ICD-10-CM

## 2023-09-15 DIAGNOSIS — L219 Seborrheic dermatitis, unspecified: Secondary | ICD-10-CM

## 2023-09-15 DIAGNOSIS — Z7189 Other specified counseling: Secondary | ICD-10-CM

## 2023-09-15 DIAGNOSIS — L821 Other seborrheic keratosis: Secondary | ICD-10-CM

## 2023-09-15 DIAGNOSIS — D492 Neoplasm of unspecified behavior of bone, soft tissue, and skin: Secondary | ICD-10-CM | POA: Diagnosis not present

## 2023-09-15 DIAGNOSIS — C44212 Basal cell carcinoma of skin of right ear and external auricular canal: Secondary | ICD-10-CM

## 2023-09-15 MED ORDER — KETOCONAZOLE 2 % EX SHAM: MEDICATED_SHAMPOO | CUTANEOUS | 11 refills | Status: DC

## 2023-09-15 MED ORDER — MUPIROCIN 2 % EX OINT: TOPICAL_OINTMENT | CUTANEOUS | 2 refills | Status: DC

## 2023-09-15 NOTE — Patient Instructions (Addendum)
Cryotherapy Aftercare  Wash gently with soap and water everyday.   Apply Vaseline Jelly daily until healed.    Wound Care Instructions  Cleanse wound gently with soap and water once a day then pat dry with clean gauze. Apply a thin coat of Mupirocin over the wound (unless you have an allergy to this). Do not pick or remove scabs. Do not remove the yellow or white "healing tissue" from the base of the wound.  Cover the wound with fresh, clean, nonstick gauze and secure with paper tape. You may use Band-Aids in place of gauze and tape if the wound is small enough, but would recommend trimming much of the tape off as there is often too much. Sometimes Band-Aids can irritate the skin.  You should call the office for your biopsy report after 1 week if you have not already been contacted.  If you experience any problems, such as abnormal amounts of bleeding, swelling, significant bruising, significant pain, or evidence of infection, please call the office immediately.  FOR ADULT SURGERY PATIENTS: If you need something for pain relief you may take 1 extra strength Tylenol (acetaminophen) AND 2 Ibuprofen (200mg  each) together every 4 hours as needed for pain. (do not take these if you are allergic to them or if you have a reason you should not take them.) Typically, you may only need pain medication for 1 to 3 days.    Recommend daily broad spectrum sunscreen SPF 30+ to sun-exposed areas, reapply every 2 hours as needed. Call for new or changing lesions.  Staying in the shade or wearing long sleeves, sun glasses (UVA+UVB protection) and wide brim hats (4-inch brim around the entire circumference of the hat) are also recommended for sun protection.     Melanoma ABCDEs  Melanoma is the most dangerous type of skin cancer, and is the leading cause of death from skin disease.  You are more likely to develop melanoma if you: Have light-colored skin, light-colored eyes, or red or blond hair Spend a lot  of time in the sun Tan regularly, either outdoors or in a tanning bed Have had blistering sunburns, especially during childhood Have a close family member who has had a melanoma Have atypical moles or large birthmarks  Early detection of melanoma is key since treatment is typically straightforward and cure rates are extremely high if we catch it early.   The first sign of melanoma is often a change in a mole or a new dark spot.  The ABCDE system is a way of remembering the signs of melanoma.  A for asymmetry:  The two halves do not match. B for border:  The edges of the growth are irregular. C for color:  A mixture of colors are present instead of an even brown color. D for diameter:  Melanomas are usually (but not always) greater than 6mm - the size of a pencil eraser. E for evolution:  The spot keeps changing in size, shape, and color.  Please check your skin once per month between visits. You can use a small mirror in front and a large mirror behind you to keep an eye on the back side or your body.   If you see any new or changing lesions before your next follow-up, please call to schedule a visit.  Please continue daily skin protection including broad spectrum sunscreen SPF 30+ to sun-exposed areas, reapplying every 2 hours as needed when you're outdoors.   Staying in the shade or wearing long sleeves,  sun glasses (UVA+UVB protection) and wide brim hats (4-inch brim around the entire circumference of the hat) are also recommended for sun protection.      Due to recent changes in healthcare laws, you may see results of your pathology and/or laboratory studies on MyChart before the doctors have had a chance to review them. We understand that in some cases there may be results that are confusing or concerning to you. Please understand that not all results are received at the same time and often the doctors may need to interpret multiple results in order to provide you with the best plan of  care or course of treatment. Therefore, we ask that you please give [place] 2 business days to thoroughly review all your results before contacting the office for clarification. Should we see a critical lab result, you will be contacted sooner.   If You Need Anything After Your Visit  If you have any questions or concerns for your doctor, please call our main line at [phone] and press option 4 to reach your doctor's medical assistant. If no one answers, please leave a voicemail as directed and we will return your call as soon as possible. Messages left after 4 pm will be answered the following business day.   You may also send [place] a message via MyChart. We typically respond to MyChart messages within 1-2 business days.  For prescription refills, please ask your pharmacy to contact our office. Our fax number is [phone].  If you have an urgent issue when the clinic is closed that cannot wait until the next business day, you can page your doctor at the number below.    Please note that while we do our best to be available for urgent issues outside of office hours, we are not available 24/7.   If you have an urgent issue and are unable to reach [place], you may choose to seek medical care at your doctor's office, retail clinic, urgent care center, or emergency room.  If you have a medical emergency, please immediately call 911 or go to the emergency department.  Pager Numbers  - Dr. Gwen Pounds: [phone]  - Dr. Roseanne Reno: [phone]  - Dr. Katrinka Blazing: [phone]   In the event of inclement weather, please call our main line at [phone] for an update on the status of any delays or closures.  Dermatology Medication Tips: Please keep the boxes that topical medications come in in order to help keep track of the instructions about where and how to use these. Pharmacies typically print the medication instructions only on the boxes and not directly on the medication tubes.   If your medication  is too expensive, please contact our office at [phone] option 4 or send [place] a message through MyChart.   We are unable to tell what your co-pay for medications will be in advance as this is different depending on your insurance coverage. However, we may be able to find a substitute medication at lower cost or fill out paperwork to get insurance to cover a needed medication.   If a prior authorization is required to get your medication covered by your insurance company, please allow [place] 1-2 business days to complete this process.  Drug prices often vary depending on where the prescription is filled and some pharmacies may offer cheaper prices.  The website www.goodrx.com contains coupons for medications through different pharmacies. The prices here do not account for what the cost may be with help from insurance (it may be cheaper  with your insurance), but the website can give you the price if you did not use any insurance.  - You can print the associated coupon and take it with your prescription to the pharmacy.  - You may also stop by our office during regular business hours and pick up a GoodRx coupon card.  - If you need your prescription sent electronically to a different pharmacy, notify our office through [facility] or by phone at [phone] option 4.     Si Usted Necesita Algo Despus de Su Visita  Tambin puede enviarnos un mensaje a travs de Clinical cytogeneticist. Por lo general respondemos a los mensajes de MyChart en el transcurso de 1 a 2 das hbiles.  Para renovar recetas, por favor pida a su farmacia que se ponga en contacto con nuestra oficina. Annie Sable de fax es [place] [phone].  Si tiene un asunto urgente cuando la clnica est cerrada y que no puede esperar hasta el siguiente da hbil, puede llamar/localizar a su doctor(a) al nmero que aparece a continuacin.   Por favor, tenga en cuenta que aunque hacemos todo lo posible para estar disponibles para asuntos  urgentes fuera del horario de [place], no estamos disponibles las 24 horas del da, los 7 [address]  [address] de [place].   Si tiene un problema urgente y no puede comunicarse con nosotros, puede optar por buscar atencin mdica  en el consultorio de su doctor(a), en una clnica privada, en un centro de atencin urgente o en una sala de emergencias.  Si tiene Engineer, drilling, por favor llame inmediatamente al 911 o vaya a la sala de emergencias.  Nmeros de bper  - Dr. Gwen Pounds: [phone]  - Dra. Roseanne Reno: [phone]  - Dr. Katrinka Blazing: [phone]   En caso de inclemencias del tiempo, por favor llame a Lacy Duverney principal al [phone] para una actualizacin sobre el [place] de cualquier retraso o cierre.  Consejos para la medicacin en dermatologa: Por favor, guarde las cajas en las que vienen los medicamentos de uso tpico para ayudarle a seguir las instrucciones sobre dnde y cmo usarlos. Las farmacias generalmente imprimen las instrucciones del medicamento slo en las cajas y no directamente en los tubos del [place].   Si su medicamento es muy caro, por favor, pngase en contacto con Rolm Gala llamando al [phone] y presione la opcin 4 o envenos un mensaje a travs de Clinical cytogeneticist.   No podemos decirle cul ser su copago por los medicamentos por adelantado ya que esto es diferente dependiendo de la cobertura de su seguro. Sin embargo, es posible que podamos encontrar un medicamento sustituto a Audiological scientist un formulario para que el seguro cubra el medicamento que se considera necesario.   Si se requiere una autorizacin previa para que su compaa de seguros [place] su medicamento, por favor permtanos de 1 a 2 das hbiles para completar [address]  Los precios de los medicamentos varan con frecuencia dependiendo del Environmental consultant de dnde se surte la receta y alguna farmacias pueden ofrecer precios ms baratos.  El sitio web www.goodrx.com tiene cupones para  medicamentos de Health and safety inspector. Los precios aqu no tienen en cuenta lo que podra costar con la ayuda del seguro (puede ser ms barato con su seguro), pero el sitio web puede darle el precio si no utiliz Tourist information centre manager.  - Puede imprimir el cupn correspondiente y llevarlo con su receta a la farmacia.  - Tambin puede pasar por nuestra oficina durante el horario de atencin regular y Education officer, museum  una tarjeta de cupones de GoodRx.  - Si necesita que su receta se enve electrnicamente a una farmacia diferente, informe a nuestra oficina a travs de MyChart de [place] o por telfono llamando al [phone] y presione la opcin 4.

## 2023-09-15 NOTE — Progress Notes (Signed)
Follow-Up Visit   Subjective  Brett Bender is a 77 y.o. male who presents for the following: Skin Cancer Screening and Full Body Skin Exam. HxBCCs, multiple. Hx of AKs.   Spot at left upper back/shoulder. Will not heal. States has not picked at area.   The patient presents for Total-Body Skin Exam (TBSE) for skin cancer screening and mole check. The patient has spots, moles and lesions to be evaluated, some may be new or changing and the patient may have concern these could be cancer.    The following portions of the chart were reviewed this encounter and updated as appropriate: medications, allergies, medical history  Review of Systems:  No other skin or systemic complaints except as noted in HPI or Assessment and Plan.  Objective  Well appearing patient in no apparent distress; mood and affect are within normal limits.  A full examination was performed including scalp, head, eyes, ears, nose, lips, neck, chest, axillae, abdomen, back, buttocks, bilateral upper extremities, bilateral lower extremities, hands, feet, fingers, toes, fingernails, and toenails. All findings within normal limits unless otherwise noted below.   Relevant physical exam findings are noted in the Assessment and Plan.  Left Superior Posterior Shoulder 1.3 cm pink pearly papule       face and ears x17 (17) Erythematous thin papules/macules with gritty scale.   Left Forearm - Posterior x2 (2) Erythematous keratotic or waxy stuck-on papule or plaque.    Assessment & Plan   SKIN CANCER SCREENING PERFORMED TODAY.  ACTINIC DAMAGE - Chronic condition, secondary to cumulative UV/sun exposure - diffuse scaly erythematous macules with underlying dyspigmentation - Recommend daily broad spectrum sunscreen SPF 30+ to sun-exposed areas, reapply every 2 hours as needed.  - Staying in the shade or wearing long sleeves, sun glasses (UVA+UVB protection) and wide brim hats (4-inch brim around the entire  circumference of the hat) are also recommended for sun protection.  - Call for new or changing lesions.  LENTIGINES, SEBORRHEIC KERATOSES, HEMANGIOMAS - Benign normal skin lesions - Benign-appearing - Call for any changes  MELANOCYTIC NEVI - Tan-brown and/or pink-flesh-colored symmetric macules and papules - Benign appearing on exam today - Observation - Call clinic for new or changing moles - Recommend daily use of broad spectrum spf 30+ sunscreen to sun-exposed areas.    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  He and his doctors who diagnosed the osteomyelitis feel that it started as a sore that he picked resulting in sepsis and subsequent osteomyelitis.  He now is more conscientious of picking at spots.  SEBORRHEIC DERMATITIS Exam: Pink patches with greasy scale at scalp  Chronic and persistent condition with duration or expected duration over one year. Condition is symptomatic/ bothersome to patient. Not currently at goal.  Seborrheic Dermatitis is a chronic persistent rash characterized by pinkness and scaling most commonly of the mid face but also can occur on the scalp (dandruff), ears; mid chest, mid back and groin.  It tends to be exacerbated by stress and cooler weather.  People who have neurologic disease may experience new onset or exacerbation of existing seborrheic dermatitis.  The condition is not curable but treatable and can be controlled.  Treatment Plan: Continue Ketoconazole 2% shampoo as directed.    Neoplasm of skin Left Superior Posterior Shoulder  Epidermal / dermal shaving  Lesion diameter (cm):  1.3 Informed consent: discussed and  consent obtained   Timeout: patient name, date of birth, surgical site, and procedure verified   Procedure prep:  Patient was prepped and draped in usual sterile fashion Prep type:  Isopropyl alcohol Anesthesia: the lesion  was anesthetized in a standard fashion   Anesthetic:  1% lidocaine w/ epinephrine 1-100,000 buffered w/ 8.4% NaHCO3 Instrument used: flexible razor blade   Hemostasis achieved with: pressure, aluminum chloride and electrodesiccation   Outcome: patient tolerated procedure well   Post-procedure details: sterile dressing applied and wound care instructions given   Dressing type: bandage and petrolatum    Destruction of lesion Complexity: extensive   Destruction method: electrodesiccation and curettage   Informed consent: discussed and consent obtained   Timeout:  patient name, date of birth, surgical site, and procedure verified Procedure prep:  Patient was prepped and draped in usual sterile fashion Prep type:  Isopropyl alcohol Anesthesia: the lesion was anesthetized in a standard fashion   Anesthetic:  1% lidocaine w/ epinephrine 1-100,000 buffered w/ 8.4% NaHCO3 Curettage performed in three different directions: Yes   Electrodesiccation performed over the curetted area: Yes   Curettage cycles:  3 Final wound size (cm):  1.3 Hemostasis achieved with:  pressure and aluminum chloride Outcome: patient tolerated procedure well with no complications   Post-procedure details: sterile dressing applied and wound care instructions given   Dressing type: bandage and petrolatum    Specimen 1 - Surgical pathology Differential Diagnosis: R/O BCC  Check Margins: No  Start Mupirocin ointment once daily with bandage change.  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 They are chronic with expected duration over 1 year. A portion of actinic keratoses will progress to squamous cell carcinoma of the skin. It is not possible to reliably predict which spots will progress to skin cancer and so treatment is recommended to prevent development of skin cancer.  Recommend daily  broad spectrum sunscreen SPF 30+ to sun-exposed areas, reapply every 2 hours as needed.  Recommend staying in the shade or wearing long sleeves, sun glasses (UVA+UVB protection) and wide brim hats (4-inch brim around the entire circumference of the hat). Call for new or changing lesions.  Destruction of lesion - face and ears x17 (17) Complexity: simple   Destruction method: cryotherapy   Informed consent: discussed and consent obtained   Timeout:  patient name, date of birth, surgical site, and procedure verified Lesion destroyed using liquid nitrogen: Yes   Region frozen until ice ball extended beyond lesion: Yes   Outcome: patient tolerated procedure well with no complications   Post-procedure details: wound care instructions given   Additional details:  Prior to procedure, discussed risks of blister formation, small wound, skin dyspigmentation, or rare scar following cryotherapy. Recommend Vaseline ointment to treated areas while healing.   Inflamed seborrheic keratosis (2) Left Forearm - Posterior x2  With LSC Symptomatic, irritating, patient would like treated.  Destruction of lesion - Left Forearm - Posterior x2 (2) Complexity: simple   Destruction method: cryotherapy   Informed consent: discussed and consent obtained   Timeout:  patient name, date of birth, surgical site, and procedure verified Lesion destroyed using liquid nitrogen: Yes   Region frozen until ice ball extended beyond lesion: Yes   Outcome: patient tolerated procedure well with no complications   Post-procedure details: wound care instructions given   Additional details:  Prior to procedure, discussed risks of blister formation, small  wound, skin dyspigmentation, or rare scar following cryotherapy. Recommend Vaseline ointment to treated areas while healing.   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  I, Lawson Radar,  CMA, am acting as scribe for Armida Sans, MD.   Documentation: I have reviewed the above documentation for accuracy and completeness, and I agree with the above.  Armida Sans, MD

## 2023-09-20 ENCOUNTER — Ambulatory Visit
Admission: RE | Admit: 2023-09-20 | Discharge: 2023-09-20 | Disposition: A | Discharge status: Home / Self Care | Payer: Medicare Other | Attending: Neurosurgery | Admitting: Neurosurgery

## 2023-09-20 ENCOUNTER — Ambulatory Visit (INDEPENDENT_AMBULATORY_CARE_PROVIDER_SITE_OTHER): Payer: Medicare Other | Admitting: Neurosurgery

## 2023-09-20 ENCOUNTER — Telehealth: Payer: Self-pay

## 2023-09-20 ENCOUNTER — Ambulatory Visit
Admission: RE | Admit: 2023-09-20 | Discharge: 2023-09-20 | Disposition: A | Discharge status: Home / Self Care | Payer: Medicare Other | Source: Ambulatory Visit | Attending: Neurosurgery | Admitting: Neurosurgery

## 2023-09-20 ENCOUNTER — Encounter: Payer: Self-pay | Admitting: Neurosurgery

## 2023-09-20 VITALS — BP 140/86 | Ht 75.0 in | Wt 200.0 lb

## 2023-09-20 DIAGNOSIS — M549 Dorsalgia, unspecified: Secondary | ICD-10-CM | POA: Diagnosis not present

## 2023-09-20 DIAGNOSIS — M4646 Discitis, unspecified, lumbar region: Secondary | ICD-10-CM | POA: Diagnosis not present

## 2023-09-20 DIAGNOSIS — G8929 Other chronic pain: Secondary | ICD-10-CM | POA: Diagnosis not present

## 2023-09-20 DIAGNOSIS — M47816 Spondylosis without myelopathy or radiculopathy, lumbar region: Secondary | ICD-10-CM | POA: Diagnosis not present

## 2023-09-20 DIAGNOSIS — M5416 Radiculopathy, lumbar region: Secondary | ICD-10-CM

## 2023-09-20 DIAGNOSIS — M25512 Pain in left shoulder: Secondary | ICD-10-CM

## 2023-09-20 DIAGNOSIS — M25562 Pain in left knee: Secondary | ICD-10-CM | POA: Diagnosis not present

## 2023-09-20 DIAGNOSIS — M47817 Spondylosis without myelopathy or radiculopathy, lumbosacral region: Secondary | ICD-10-CM

## 2023-09-20 DIAGNOSIS — I7090 Unspecified atherosclerosis: Secondary | ICD-10-CM | POA: Diagnosis not present

## 2023-09-20 DIAGNOSIS — M5126 Other intervertebral disc displacement, lumbar region: Secondary | ICD-10-CM | POA: Diagnosis not present

## 2023-09-20 LAB — SURGICAL PATHOLOGY

## 2023-09-20 NOTE — Telephone Encounter (Signed)
-----   Message from Armida Sans sent at 09/20/2023  5:20 PM EST ----- FINAL DIAGNOSIS        1. Skin, left superior posterior shoulder :       BASAL CELL CARCINOMA, NODULAR PATTERN   Cancer = BCC Already treated Recheck next visit

## 2023-09-20 NOTE — Telephone Encounter (Signed)
Advised pt of bx results/sh ?

## 2023-09-20 NOTE — Progress Notes (Signed)
Referring Physician:  Malva Limes, MD [address] [place],  [place] [address]  Primary Physician:  Malva Limes, MD  History of Present Illness: 09/20/23 [name] presents today for 3 month follow up. While he initially states that he is doing well in further conversation it sounds like he is having persistent back pain that is limiting his activity.  It seems to be significantly worse with ambulating for prolonged distances, greater than about 1/4 mile, and improves with rest.  He describes significant back pain with this.  He is largely without radiating leg symptoms.  He also endorses pain in his left shoulder and a clicking with range of motion as well as ongoing left knee pain and expresses a desire to see orthopedics for this.  05/31/23 [name] is a 77 year old presenting today for 6 week follow-up of back and leg pain.  He states that his back pain and leg pain have completely resolved.  He has finished antibiotics about 2 weeks ago.  His primary concern today is ongoing weakness in his legs however he has returned to the gym and is doing well with this.  He also endorses some left knee pain intermittently with walking.  This has been going on for several years and is largely unchanged.  04/19/23 [name] is a 77 year old presenting today for follow-up regarding low back and right hip pain.  Since his last visit he presented to the ER on 03/02/2023 for worsening back pain after an MRI which showed epidural abscess.  He was encouraged by infectious disease to present to the ER.  At that time he was having difficulty with urination and worsening pain upon standing and walking.  Neurosurgery was consulted and recommended admission to medicine for further workup, lab results, and broad-spectrum antibiotics with ID consultation.  He ultimately underwent drainage of epidural abscess by IR and his antibiotics were de-escalated to cefazolin.  The patient had his  Foley catheter removal in patient and was able to pass a trial of void.  He was discharged on 03/08/2023.  It was recommended that he complete a 6-week course of antibiotics. He followed up with infectious disease on 03/22/2023 who recommended that he continue IV cefazolin until 04/13/2023 and then complete a 4-week course of PO Cefadroxil which which he has transitioned to at this time.  Today he does report some ongoing low back pain that radiates into his right buttock and lateral thigh.  He states that this has improved some since his last visit however.  He has returned to the gym.   02/22/2023 [name] is here today for hospital follow-up with a chief complaint of low back pain that radiates into he right hip, but no pain down the legs.  He was initially seen in the ER on 02/07/2023 for an acute onset of right-sided flank pain that radiated into his right hip.  He was found to have sepsis and started on antibiotics for a staph infection.  He was also found to have an AKI. Today he reports significant improvement of his back pain however he does continue to have some pain to his back and pain that radiates into his right buttock.  He denies any pain that radiates down his legs.  He denies any numbness or tingling in the legs.   He is currently working with home physical therapy and feels this is going well. Conservative measures:  Physical therapy: has not participated Multimodal medical therapy including  regular antiinflammatories:  tizanidine, norco,  prednisone, tramadol  Injections: has not received epidural steroid injections  Past Surgery: L3-4 decompression 09/19/18 by Dr Adriana Simas Lumbar fusion in 2010 Neck surgery over 10+ years ago  Brett Bender has no symptoms of cervical myelopathy.  The symptoms are causing a significant impact on the patient's life.   Review of Systems:  A 10 point review of systems is negative, except for the pertinent positives and negatives detailed in  the HPI.  Past Medical History: Past Medical History:  Diagnosis Date   Actinic keratosis    Arthritis    Basal cell carcinoma 11/09/2013   R lateral edge of clavicle   Basal cell carcinoma 02/03/2016   L lateral supraclavicular base of neck/excision   Basal cell carcinoma 11/29/2018   L upper back   Basal cell carcinoma 09/11/2020   R mid back    Basal cell carcinoma 03/17/2023   right inferior post auricular, excised 04/19/23   Cancer (HCC)    Basal cell carcinoma bilateral arms and left shoulder   Complication of anesthesia    slow to wake up with shoulder surgery   Depression    Enlarged prostate    H/O elbow surgery    left   Herpes simplex    History of kidney stones    History of MRSA infection 2004   skin around eye   Hypertension    Kidney stone    Lumbar stenosis    Migraine headache    Motion sickness    ocean boats   PONV (postoperative nausea and vomiting)    Tendonitis of ankle, left     Past Surgical History: Past Surgical History:  Procedure Laterality Date   APPENDECTOMY     back injection     BACK SURGERY     CYSTOSCOPY/URETEROSCOPY/HOLMIUM LASER/STENT PLACEMENT Left 12/18/2015   Procedure: CYSTOSCOPY/URETEROSCOPY/HOLMIUM LASER LITHOTRIPSY;  Surgeon: Vanna Scotland, MD;  Location: [facility];  Service: Urology;  Laterality: Left;   ELBOW SURGERY     IR LUMBAR DISC ASPIRATION W/IMG GUIDE  03/03/2023   LOWER BACK SURGERY  2010   L4-5 fusion   LUMBAR LAMINECTOMY/DECOMPRESSION MICRODISCECTOMY N/A 08/21/2018   Procedure: LUMBAR LAMINECTOMY/DECOMPRESSION MICRODISCECTOMY 1 LEVEL-L3/4;  Surgeon: Lucy Chris, MD;  Location: [facility];  Service: Neurosurgery;  Laterality: N/A;   NECK SURGERY     Upper neck surgery; with wires placed   SHOULDER SURGERY Right 11/10/2011   arthroscopic surgery . Dr. Katrinka Blazing, [facility]   TEE WITHOUT CARDIOVERSION N/A 02/11/2023   Procedure: TRANSESOPHAGEAL ECHOCARDIOGRAM;  Surgeon: Debbe Odea, MD;  Location: [facility];  Service:  Cardiovascular;  Laterality: N/A;   TONSILLECTOMY     TRICEPS TENDON REPAIR Left 12/04/2019   Procedure: TRICEPS TENDON REPAIR, OLECRANON BURSA;  Surgeon: Signa Kell, MD;  Location: [facility];  Service: Orthopedics;  Laterality: Left;    Allergies: Allergies as of 09/20/2023 - Review Complete 09/15/2023  Allergen Reaction Noted   Penicillins Other (See Comments) 11/18/2015    Medications: Outpatient Encounter Medications as of 09/20/2023  Medication Sig   amLODipine (NORVASC) 10 MG tablet TAKE ONE TABLET BY MOUTH ONCE A DAY   aspirin 81 MG chewable tablet Chew by mouth daily.   benzonatate (TESSALON) 100 MG capsule Take 1 capsule (100 mg total) by mouth 3 (three) times daily as needed for cough.   diclofenac (VOLTAREN) 50 MG EC tablet TAKE ONE TABLET BY MOUTH TWICE DAILY AS NEEDED   diclofenac Sodium (VOLTAREN) 1 % GEL APPLY 4G  TOPICALLY FIVE TIMES DAILY   finasteride (PROSCAR) 5 MG tablet Take 1 tablet (5 mg total) by mouth daily.   ketoconazole (NIZORAL) 2 % shampoo 3 times weekly lather on scalp, leave on 5-8 minutes, rinse well.   latanoprost (XALATAN) 0.005 % ophthalmic solution Place 1 drop into both eyes at bedtime.    mirtazapine (REMERON) 30 MG tablet TAKE ONE TABLET BY MOUTH EVERY NIGHT AT BEDTIME   mometasone (ELOCON) 0.1 % cream Apply 1 application. topically daily as needed (Rash). Qd up to 5 days a week to itchy bumps on body   Multiple Vitamin (MULTIVITAMIN) tablet Take 1 tablet by mouth daily.   mupirocin ointment (BACTROBAN) 2 % Apply once daily with bandage change   Probiotic Product (DIGESTIVE ADVANTAGE PO) Take 1 tablet by mouth daily.   psyllium (METAMUCIL) 58.6 % powder Take 1 packet by mouth daily as needed (regularity).    rosuvastatin (CRESTOR) 20 MG tablet Take 1 tablet (20 mg total) by mouth daily.   tamsulosin (FLOMAX) 0.4 MG CAPS capsule Take 1 capsule (0.4 mg total) by mouth daily.   timolol (TIMOPTIC) 0.5 % ophthalmic solution Place 1 drop into  both eyes daily.    traMADol (ULTRAM) 50 MG tablet TAKE ONE TABLET (50 MG TOTAL) BY MOUTH EVERY EIGHT (EIGHT) HOURS AS NEEDED. DO NOT TAKE WITH GABAPENTIN OR TIZANIDINE   No facility-administered encounter medications on file as of 09/20/2023.    Social History: Social History   Tobacco Use   Smoking status: Never   Smokeless tobacco: Never  Vaping Use   Vaping status: Never Used  Substance Use Topics   Alcohol use: No    Alcohol/week: 0.0 standard drinks of alcohol   Drug use: No    Family Medical History: Family History  Problem Relation Age of Onset   Depression Son    Diabetes Neg Hx    Cancer Neg Hx    Heart attack Neg Hx    Kidney disease Neg Hx    Prostate cancer Neg Hx    Kidney cancer Neg Hx    Bladder Cancer Neg Hx     Physical Examination: Today's Vitals   09/20/23 1313  BP: (!) 140/86  Weight: 90.7 kg  Height: 6\' 3"  (1.905 m)  PainSc: 0-No pain   Body mass index is 25 kg/m.   General: Patient is well developed, well nourished, calm, collected, and in no apparent distress. Attention to examination is appropriate.  NEUROLOGICAL:     Awake, alert, oriented to person, place, and time.  Speech is clear and fluent. Fund of knowledge is appropriate.   Cranial Nerves: Pupils equal round and reactive to light.  Facial tone is symmetric.   ROM of spine: full.  Palpation of spine: non tender.    Strength: Side Biceps Triceps Deltoid Interossei Grip Wrist Ext. Wrist Flex.  R 5 5 5 5 5 5 5   L 5 5 5 5 5 5 5    Side Iliopsoas Quads Hamstring PF DF EHL  R 5 5 5 5  4+ 5  L 5 5 5 5 5 5    Ambulates with a steppage gait  Medical Decision Making  Imaging: 03/02/23 MRI L-spine   IMPRESSION: 1. 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 Associated intradural component as above. 2. Associated paraspinous inflammatory changes  with left psoas abscesses as above. 3. Underlying multilevel degenerative spondylosis and facet arthrosis, most pronounced at  L4-5 where there is resultant moderate to severe spinal stenosis.     Electronically Signed   By: Rise Mu M.D.   On: 03/02/2023 21:19  02/08/23 MRI L-spine  FINDINGS: Segmentation: 5 non rib-bearing vertebral bodies. The lowest fully formed disc space is labeled L5-S1.   Alignment: 5 mm anterolisthesis of L4 on L5 unchanged from prior exam. 4 mm retrolisthesis of L3 on L4 appears new from the prior exam. Mild dextrocurvature.   Vertebrae: No acute fracture or suspicious osseous lesion. A small amount of enhancement is noted at the inferior aspect of T12, where a Schmorl's node appears to be forming, and about the L3-L4 disc space, which correlates with edema on the STIR and is favored to be related to adjacent degenerative changes. No evidence of discitis. Postsurgical changes at L3-L4 and L4-L5.   Conus medullaris and cauda equina: Conus extends to the L2 level. Conus and cauda equina appear normal. No abnormal enhancement. No epidural collection.   Paraspinal and other soft tissues: Negative.   Disc levels:   T12-L1: Minimal disc bulge. Mild facet arthropathy. No spinal canal stenosis or neural foraminal narrowing.   L1-L2: No significant disc bulge. Mild facet arthropathy. No spinal canal stenosis or neural foraminal narrowing.   L2-L3: Minimal disc bulge. Mild facet arthropathy. Ligamentum flavum hypertrophy. Mild spinal canal stenosis and mild bilateral neural foraminal narrowing, which have progressed from the prior exam. Narrowing of the lateral recesses.   L3-L4: Mild retrolisthesis with disc osteophyte complex. Moderate facet arthropathy. Interval laminectomy. No residual spinal canal stenosis. Narrowing of the left lateral recess. Moderate left-greater-than-right neural foraminal narrowing, similar to prior   L4-L5:  Status post fusion. Grade 1 anterolisthesis with disc unroofing. Severe facet arthropathy. Moderate spinal canal stenosis, which has progressed from the prior exam. Narrowing of the lateral recesses. No neural foraminal narrowing.   L5-S1: No significant disc bulge. Possible granulation tissue along the anterior aspect of the thecal sac versus extruded disc. Moderate to severe right and mild left facet arthropathy. Mild spinal canal stenosis. Narrowing of the lateral recesses. Moderate right neural foraminal narrowing, which has progressed from the prior exam   IMPRESSION: 1. No evidence of discitis or osteomyelitis. No epidural collection. 2. L4-L5 moderate spinal canal stenosis, which has progressed from the prior exam. 3. L5-S1 mild spinal canal stenosis and moderate right neural foraminal narrowing, which has progressed from the prior exam. 4. L3-L4 moderate left-greater-than-right neural foraminal narrowing. 5. L2-L3 mild spinal canal stenosis and mild bilateral neural foraminal narrowing. 6. Narrowing of the lateral recesses at L2-L3, L3-L4, L4-L5, and L5-S1 could affect the descending L3, L4, L5, and S1 nerve roots, respectively.     Electronically Signed   By: Wiliam Ke M.D.  I have personally reviewed the images and agree with the above interpretation.  Assessment and Plan: [name] is a pleasant 77 y.o. 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  While he initially says he is doing okay, further discussion reveals persistent low back pain that is significantly limiting his activity and quality of life.  Would like to obtain an updated lumbar MRI for further evaluation as well as dynamic lumbar x-rays.  I will contact him via telephone visit after completion of these.  In addition to this he has requested a referral to orthopedics.  I will place a referral for both left shoulder and knee pain at his request.  He was  encouraged to call  the office in the interim with any questions or concerns.  He expressed understanding and was in agreement with this plan.  I spent a total of 31 minutes in both face-to-face and non-face-to-face activities for this visit on the date of this encounter including review of records,, discussion of symptoms, physical exam, discussion of treatment options, and documentation.   [facility]

## 2023-09-21 ENCOUNTER — Ambulatory Visit: Payer: Self-pay | Admitting: *Deleted

## 2023-09-21 NOTE — Patient Outreach (Signed)
  Care Coordination   Follow Up Visit Note   09/21/2023 Name: JANSSEN Bender MRN: [id number] DOB: 1946/01/30  Brett Bender is a 77 y.o. year old male who sees Fisher, Demetrios Isaacs, MD for primary care. I spoke with  Brett Bender by phone today.  What matters to the patients health and wellness today?  Patient report seeing spine specialist yesterday, had x-ray done.  There was discussion regarding another back surgery, but he will need an MRI first.  Denies any urgent concerns, encouraged to contact this care manager with questions.     Goals Addressed             This Visit's Progress    Management of chronic pain       Interventions Today    Flowsheet Row Most Recent Value  Chronic Disease   Chronic disease during today's visit Other  [chronic back/knee pain]  General Interventions   General Interventions Discussed/Reviewed General Interventions Reviewed, Doctor Visits  Doctor Visits Discussed/Reviewed Doctor Visits Reviewed, Specialist  [seen by spine specialist yesterday, waiting for call for MRI]  PCP/Specialist Visits Compliance with follow-up visit  Exercise Interventions   Exercise Discussed/Reviewed Physical Activity  Physical Activity Discussed/Reviewed Physical Activity Reviewed  [No longer going to [facility], state sessions helped but didn't alleviate pain or limp]  Education Interventions   Education Provided Provided Education  Provided Verbal Education On Medication, When to see the doctor              SDOH assessments and interventions completed:  No     Care Coordination Interventions:  Yes, provided   Follow up plan: Follow up call scheduled for 12/11    Encounter Outcome:  Patient Visit Completed   Kemper Durie RN, MSN, CCM [place]  [facility], [facility] RN Care Coordinator Direct Dial: [phone] / Main [phone] Fax [phone] Email: [email or web address] Website: [email or web address]

## 2023-09-28 ENCOUNTER — Ambulatory Visit
Admission: RE | Admit: 2023-09-28 | Discharge: 2023-09-28 | Disposition: A | Discharge status: Home / Self Care | Payer: Medicare Other | Source: Ambulatory Visit | Attending: Neurosurgery | Admitting: Neurosurgery

## 2023-09-28 DIAGNOSIS — M4627 Osteomyelitis of vertebra, lumbosacral region: Secondary | ICD-10-CM | POA: Diagnosis not present

## 2023-09-28 DIAGNOSIS — M47817 Spondylosis without myelopathy or radiculopathy, lumbosacral region: Secondary | ICD-10-CM | POA: Insufficient documentation

## 2023-09-28 DIAGNOSIS — M4807 Spinal stenosis, lumbosacral region: Secondary | ICD-10-CM | POA: Diagnosis not present

## 2023-09-28 DIAGNOSIS — M4646 Discitis, unspecified, lumbar region: Secondary | ICD-10-CM | POA: Diagnosis not present

## 2023-09-28 DIAGNOSIS — M5416 Radiculopathy, lumbar region: Secondary | ICD-10-CM | POA: Insufficient documentation

## 2023-09-28 DIAGNOSIS — M4649 Discitis, unspecified, multiple sites in spine: Secondary | ICD-10-CM | POA: Diagnosis not present

## 2023-09-28 DIAGNOSIS — M48061 Spinal stenosis, lumbar region without neurogenic claudication: Secondary | ICD-10-CM | POA: Diagnosis not present

## 2023-10-11 ENCOUNTER — Other Ambulatory Visit: Payer: Self-pay | Admitting: Family Medicine

## 2023-10-11 DIAGNOSIS — I10 Essential (primary) hypertension: Secondary | ICD-10-CM

## 2023-10-13 NOTE — Telephone Encounter (Signed)
Requested Prescriptions  Pending Prescriptions Disp Refills   amLODipine (NORVASC) 10 MG tablet [Pharmacy Med Name: AMLODIPINE BESYLATE 10MG  TABLET] 90 tablet 0    Sig: TAKE ONE TABLET BY MOUTH ONCE A DAY     Cardiovascular: Calcium Channel Blockers 2 Failed - 10/11/2023  9:13 AM      Failed - Last BP in normal range    BP Readings from Last 1 Encounters:  09/20/23 (!) 140/86         Passed - Last Heart Rate in normal range    Pulse Readings from Last 1 Encounters:  06/10/23 (!) 56         Passed - Valid encounter within last 6 months    Recent Outpatient Visits           3 months ago Positive self-administered antigen test for COVID-19   [facility], Monico Blitz, DO   5 months ago Elevated lipids   [facility] Malva Limes, MD   7 months ago Lumbar radiculopathy   [facility] Malva Limes, MD   7 months ago Lumbar radiculopathy   [facility] [name], PA-C   7 months ago Hospital discharge follow-up   [facility] Jacky Kindle, FNP       Future Appointments             In 5 months Deirdre Evener, MD [facility]   In 8 months McGowan, Elana Alm [facility] [place]

## 2023-10-17 ENCOUNTER — Other Ambulatory Visit: Payer: Self-pay | Admitting: Family Medicine

## 2023-10-17 DIAGNOSIS — M1990 Unspecified osteoarthritis, unspecified site: Secondary | ICD-10-CM

## 2023-10-19 ENCOUNTER — Other Ambulatory Visit: Payer: Self-pay

## 2023-10-19 NOTE — Patient Outreach (Signed)
  Care Management   Visit Note  10/19/2023 Name: Brett Bender MRN: [id number] DOB: 01-13-1946  Subjective: Brett Bender is a 77 y.o. year old male who is a primary care patient of Fisher, Demetrios Isaacs, MD. The Care Management team was consulted for assistance.      Engaged with Brett Bender via telephone.  Assessment:  Review of patient past medical history, allergies, medications, health status, including review of consultants reports, laboratory and other test data, was performed as part of evaluation and provision of care management services.    Outpatient Encounter Medications as of 10/19/2023  Medication Sig Note   amLODipine (NORVASC) 10 MG tablet TAKE ONE TABLET BY MOUTH ONCE A DAY    aspirin 81 MG chewable tablet Chew by mouth daily.    diclofenac (VOLTAREN) 50 MG EC tablet TAKE ONE TABLET BY MOUTH TWICE DAILY AS NEEDED    diclofenac Sodium (VOLTAREN) 1 % GEL APPLY 4G TOPICALLY FIVE TIMES DAILY    finasteride (PROSCAR) 5 MG tablet Take 1 tablet (5 mg total) by mouth daily.    ketoconazole (NIZORAL) 2 % shampoo 3 times weekly lather on scalp, leave on 5-8 minutes, rinse well.    latanoprost (XALATAN) 0.005 % ophthalmic solution Place 1 drop into both eyes at bedtime.     mirtazapine (REMERON) 30 MG tablet TAKE ONE TABLET BY MOUTH EVERY NIGHT AT BEDTIME    Multiple Vitamin (MULTIVITAMIN) tablet Take 1 tablet by mouth daily.    mupirocin ointment (BACTROBAN) 2 % Apply once daily with bandage change    Probiotic Product (DIGESTIVE ADVANTAGE PO) Take 1 tablet by mouth daily.    psyllium (METAMUCIL) 58.6 % powder Take 1 packet by mouth daily as needed (regularity).     rosuvastatin (CRESTOR) 20 MG tablet Take 1 tablet (20 mg total) by mouth daily.    tamsulosin (FLOMAX) 0.4 MG CAPS capsule Take 1 capsule (0.4 mg total) by mouth daily.    timolol (TIMOPTIC) 0.5 % ophthalmic solution Place 1 drop into both eyes daily.     traMADol (ULTRAM) 50 MG tablet TAKE ONE TABLET (50 MG TOTAL) BY  MOUTH EVERY EIGHT (EIGHT) HOURS AS NEEDED. DO NOT TAKE WITH GABAPENTIN OR TIZANIDINE 10/19/2023: Reports pain has been well controlled. Does not require frequent use.   No facility-administered encounter medications on file as of 10/19/2023.     Interventions:   Goals Addressed             This Visit's Progress    Management of chronic pain       Interventions Today    Flowsheet Row Most Recent Value  Chronic Disease   Chronic disease during today's visit Other  General Interventions   General Interventions Discussed/Reviewed General Interventions Reviewed, Doctor Visits  Doctor Visits Discussed/Reviewed Doctor Visits Reviewed, Specialist   PCP/Specialist Visits Compliance with follow-up visit  Exercise Interventions   Exercise Discussed/Reviewed Physical Activity  Physical Activity Discussed/Reviewed Physical Activity Reviewed   Education Interventions   Education Provided Provided Education  Provided Verbal Education On Medication, When to see the doctor               PLAN Will follow up on 11/04/23 at 11:45   [facility]  [facility], [facility] RN Care Manager Direct Dial: [phone] Website: [email or web address]

## 2023-10-22 ENCOUNTER — Other Ambulatory Visit: Payer: Self-pay | Admitting: Family Medicine

## 2023-10-24 NOTE — Telephone Encounter (Signed)
Requested medication (s) are due for refill today: review  Requested medication (s) are on the active medication list: yes  Last refill:  07/21/23  Future visit scheduled: no  Notes to clinic:  dc'd by ED provider, ok to refill? Discontinued by: Sunnie Nielsen, DO on 02/13/2023 10:11      Requested Prescriptions  Pending Prescriptions Disp Refills   atenolol-chlorthalidone (TENORETIC) 50-25 MG tablet [Pharmacy Med Name: ATENOLOL/CHLORTHALIDONE 50-25MG  TABLET] 45 tablet 4    Sig: TAKE ONE HALF (1/2) TABLET BY MOUTH ONCE DAILY     Cardiovascular: Beta Blocker + Diuretic Combos Failed - 10/24/2023 12:13 PM      Failed - Last BP in normal range    BP Readings from Last 1 Encounters:  09/20/23 (!) 140/86         Passed - K in normal range and within 180 days    Potassium  Date Value Ref Range Status  05/11/2023 4.5 3.5 - 5.2 mmol/L Final  02/27/2012 3.9 3.5 - 5.1 mmol/L Final         Passed - Na in normal range and within 180 days    Sodium  Date Value Ref Range Status  05/11/2023 142 134 - 144 mmol/L Final  02/27/2012 137 136 - 145 mmol/L Final         Passed - Cr in normal range and within 180 days    Creatinine  Date Value Ref Range Status  02/27/2012 0.93 0.60 - 1.30 mg/dL Final   Creatinine, Ser  Date Value Ref Range Status  05/11/2023 0.89 0.76 - 1.27 mg/dL Final         Passed - eGFR in normal range and within 180 days    EGFR (African American)  Date Value Ref Range Status  02/27/2012 >60  Final   GFR calc Af Amer  Date Value Ref Range Status  12/31/2020 67 >59 mL/min/1.73 Final    Comment:    **In accordance with recommendations from the NKF-ASN Task force,**   [facility] is in the process of updating its eGFR calculation to the   2021 CKD-EPI creatinine equation that estimates kidney function   without a race variable.    EGFR (Non-African Amer.)  Date Value Ref Range Status  02/27/2012 >60  Final    Comment:    eGFR values <10mL/min/1.73 m2 may  be an indication of chronic kidney disease (CKD). Calculated eGFR is useful in patients with stable renal function. The eGFR calculation will not be reliable in acutely ill patients when serum creatinine is changing rapidly. It is not useful in  patients on dialysis. The eGFR calculation may not be applicable to patients at the low and high extremes of body sizes, pregnant women, and vegetarians.    GFR, Estimated  Date Value Ref Range Status  03/08/2023 >60 >60 mL/min Final    Comment:    (NOTE) Calculated using the CKD-EPI Creatinine Equation (2021)    eGFR  Date Value Ref Range Status  05/11/2023 89 >59 mL/min/1.73 Final         Passed - Last Heart Rate in normal range    Pulse Readings from Last 1 Encounters:  06/10/23 (!) 56         Passed - Valid encounter within last 6 months    Recent Outpatient Visits           3 months ago Positive self-administered antigen test for COVID-19   [facility], Monico Blitz, DO   5  months ago Elevated lipids   [facility] Malva Limes, MD   7 months ago Lumbar radiculopathy   [facility] Malva Limes, MD   7 months ago Lumbar radiculopathy   [facility] [name], PA-C   8 months ago Hospital discharge follow-up   [facility] Jacky Kindle, FNP       Future Appointments             In 5 months Deirdre Evener, MD [facility]   In 7 months McGowan, Elana Alm [facility] [place]

## 2023-10-25 ENCOUNTER — Ambulatory Visit (INDEPENDENT_AMBULATORY_CARE_PROVIDER_SITE_OTHER): Payer: Medicare Other | Admitting: Neurosurgery

## 2023-10-25 DIAGNOSIS — M5136 Other intervertebral disc degeneration, lumbar region with discogenic back pain only: Secondary | ICD-10-CM

## 2023-10-25 DIAGNOSIS — M48061 Spinal stenosis, lumbar region without neurogenic claudication: Secondary | ICD-10-CM | POA: Diagnosis not present

## 2023-10-25 DIAGNOSIS — M4726 Other spondylosis with radiculopathy, lumbar region: Secondary | ICD-10-CM | POA: Diagnosis not present

## 2023-10-25 DIAGNOSIS — M47817 Spondylosis without myelopathy or radiculopathy, lumbosacral region: Secondary | ICD-10-CM

## 2023-10-25 DIAGNOSIS — M5416 Radiculopathy, lumbar region: Secondary | ICD-10-CM

## 2023-10-25 NOTE — Progress Notes (Signed)
Neurosurgery Telephone (Audio-Only) Note  Requesting Provider     No referring provider defined for this encounter. T: N/A F:   Primary Care Provider Malva Limes, MD [address] [place] [address] T: [phone] F: [phone]  Telehealth visit was conducted with [name], a 77 y.o. male via telephone.  History of Present Illness: Mr. Castellano presents today via telephone visit to discuss his MRI and x-ray results and further plan of care.  Overall he feels like things have improved since his last visit.  He does have some discomfort in his back with prolonged periods of ambulating but is without pain when sitting or lying down.  He continues to be without radiating pain into his legs.  His primary concern at present is his ongoing shoulder pain which is affecting his ability to sleep at night.  He has an upcoming appointment at [facility] in January for further evaluation of this.  09/20/23 Mr. Sorby presents today for 3 month follow up. While he initially states that he is doing well in further conversation it sounds like he is having persistent back pain that is limiting his activity.  It seems to be significantly worse with ambulating for prolonged distances, greater than about 1/4 mile, and improves with rest.  He describes significant back pain with this.  He is largely without radiating leg symptoms.  He also endorses pain in his left shoulder and a clicking with range of motion as well as ongoing left knee pain and expresses a desire to see orthopedics for this.   05/31/23 Mr. Benjy Sandles is a 77 year old presenting today for 6 week follow-up of back and leg pain.  He states that his back pain and leg pain have completely resolved.  He has finished antibiotics about 2 weeks ago.  His primary concern today is ongoing weakness in his legs however he has returned to the gym and is doing well with this.  He also endorses some left knee pain intermittently with  walking.  This has been going on for several years and is largely unchanged.   04/19/23 Mr. Branford Alvira is a 77 year old presenting today for follow-up regarding low back and right hip pain.  Since his last visit he presented to the ER on 03/02/2023 for worsening back pain after an MRI which showed epidural abscess.  He was encouraged by infectious disease to present to the ER.  At that time he was having difficulty with urination and worsening pain upon standing and walking.  Neurosurgery was consulted and recommended admission to medicine for further workup, lab results, and broad-spectrum antibiotics with ID consultation.  He ultimately underwent drainage of epidural abscess by IR and his antibiotics were de-escalated to cefazolin.  The patient had his Foley catheter removal in patient and was able to pass a trial of void.  He was discharged on 03/08/2023.  It was recommended that he complete a 6-week course of antibiotics. He followed up with infectious disease on 03/22/2023 who recommended that he continue IV cefazolin until 04/13/2023 and then complete a 4-week course of PO Cefadroxil which which he has transitioned to at this time.  Today he does report some ongoing low back pain that radiates into his right buttock and lateral thigh.  He states that this has improved some since his last visit however.  He has returned to the gym.     02/22/2023 Mr. Davell Hafez is here today for hospital follow-up with a chief complaint of low  back pain that radiates into he right hip, but no pain down the legs.  He was initially seen in the ER on 02/07/2023 for an acute onset of right-sided flank pain that radiated into his right hip.  He was found to have sepsis and started on antibiotics for a staph infection.  He was also found to have an AKI. Today he reports significant improvement of his back pain however he does continue to have some pain to his back and pain that radiates into his right buttock.  He denies any  pain that radiates down his legs.  He denies any numbness or tingling in the legs.   He is currently working with home physical therapy and feels this is going well. Conservative measures:  Physical therapy: has not participated Multimodal medical therapy including regular antiinflammatories:  tizanidine, norco,  prednisone, tramadol  Injections: has not received epidural steroid injections   Past Surgery: L3-4 decompression 09/19/18 by Dr Adriana Simas Lumbar fusion in 2010 Neck surgery over 10+ years ago   SVEN TRIBE has no symptoms of cervical myelopathy.   The symptoms are causing a significant impact on the patient's life.   General Review of Systems:  A ROS was performed including pertinent positive and negatives as documented.  All other systems are negative.   Prior to Admission medications   Medication Sig Start Date End Date Taking? Authorizing Provider  amLODipine (NORVASC) 10 MG tablet TAKE ONE TABLET BY MOUTH ONCE A DAY 10/13/23   Malva Limes, MD  aspirin 81 MG chewable tablet Chew by mouth daily.    [provider]  atenolol-chlorthalidone (TENORETIC) 50-25 MG tablet TAKE ONE HALF (1/2) TABLET BY MOUTH ONCE DAILY 10/25/23   Malva Limes, MD  diclofenac (VOLTAREN) 50 MG EC tablet TAKE ONE TABLET BY MOUTH TWICE DAILY AS NEEDED 08/02/23   Malva Limes, MD  diclofenac Sodium (VOLTAREN) 1 % GEL APPLY 4G TOPICALLY FIVE TIMES DAILY 10/17/23   Malva Limes, MD  finasteride (PROSCAR) 5 MG tablet Take 1 tablet (5 mg total) by mouth daily. 06/10/23   Michiel Cowboy A, PA-C  ketoconazole (NIZORAL) 2 % shampoo 3 times weekly lather on scalp, leave on 5-8 minutes, rinse well. 09/15/23   Deirdre Evener, MD  latanoprost (XALATAN) 0.005 % ophthalmic solution Place 1 drop into both eyes at bedtime.  12/05/18   [provider]  mirtazapine (REMERON) 30 MG tablet TAKE ONE TABLET BY MOUTH EVERY NIGHT AT BEDTIME 06/24/23   Malva Limes, MD  Multiple Vitamin  (MULTIVITAMIN) tablet Take 1 tablet by mouth daily.    [provider]  mupirocin ointment (BACTROBAN) 2 % Apply once daily with bandage change 09/15/23   Deirdre Evener, MD  Probiotic Product (DIGESTIVE ADVANTAGE PO) Take 1 tablet by mouth daily.    [provider]  psyllium (METAMUCIL) 58.6 % powder Take 1 packet by mouth daily as needed (regularity).     [provider]  rosuvastatin (CRESTOR) 20 MG tablet Take 1 tablet (20 mg total) by mouth daily. 02/24/23   Jacky Kindle, FNP  tamsulosin (FLOMAX) 0.4 MG CAPS capsule Take 1 capsule (0.4 mg total) by mouth daily. 06/10/23   Michiel Cowboy A, PA-C  timolol (TIMOPTIC) 0.5 % ophthalmic solution Place 1 drop into both eyes daily.  07/04/19   [provider]  traMADol (ULTRAM) 50 MG tablet TAKE ONE TABLET (50 MG TOTAL) BY MOUTH EVERY EIGHT (EIGHT) HOURS AS NEEDED. DO NOT TAKE WITH  GABAPENTIN OR TIZANIDINE 04/22/23   Malva Limes, MD    DATA REVIEWED    Imaging Studies  MRI L spine 09/28/23 FINDINGS: Segmentation: Conventional anatomy assumed, with the last open disc space designated L5-S1.Concordant with prior imaging.   Alignment:  Stable chronic grade 1 anterolisthesis at L4-5.   Vertebrae: The previously demonstrated changes of diskitis and osteomyelitis at L5-S1 have significantly improved in the interval. There is no longer any significant discal T2 hyperintensity, and the vertebral body marrow edema has nearly completely resolved. Endplate irregularity and mild loss of vertebral body height have slightly progressed. The chronic endplate degenerative changes at the L3-4 and L4-5 levels are stable. No evidence of new discitis, osteomyelitis or acute fracture.   Conus medullaris: Extends to the L2 level and appears normal.   Paraspinal and other soft tissues: Improvement in previously demonstrated paraspinal inflammatory changes at L5-S1. No residual focal paraspinal or epidural fluid  collection. No new paraspinal abnormalities.   Disc levels:   Sagittal images demonstrate mild loss of disc height with disc bulging at T11-12 and T12-L1. No resulting spinal stenosis or nerve root encroachment.   L1-2: Stable mild disc bulging eccentric to the left and mild bilateral facet hypertrophy. No significant spinal stenosis or nerve root encroachment.   L2-3: Preserved disc height with mild disc bulging eccentric to the right. Progressive facet and ligamentum flavum hypertrophy asymmetric to the right. Resulting progressive multifactorial spinal stenosis (moderate to severe) with asymmetric narrowing of the right lateral recess. Mild right-greater-than-left foraminal narrowing.   L3-4: Previous posterior decompression at this level with chronic loss of disc height, annular disc bulging and endplate osteophytes asymmetric to the left. No significant spinal stenosis. Unchanged asymmetric narrowing of the left lateral recess with moderate left greater than right foraminal narrowing.   L4-5: Prior interbody fusion with chronic endplate osteophytes and moderate bilateral facet hypertrophy. Moderate multifactorial spinal stenosis appears stable. The foramina are sufficiently patent.   L5-S1: As above, improvement in the previously demonstrated changes of diskitis and osteomyelitis with progressive loss of vertebral body height and endplate osteophytes. There is moderate facet and ligamentous hypertrophy, greater on the right. No residual epidural fluid collections are identified. There is mild-to-moderate spinal stenosis with moderate right-greater-than-left foraminal narrowing.   IMPRESSION: 1. Significant interval improvement in the previously demonstrated changes of diskitis and osteomyelitis at L5-S1. No residual epidural or paraspinal fluid collections are identified. 2. Progressive multifactorial spinal stenosis at L2-3 (moderate to severe) with asymmetric narrowing  of the right lateral recess. 3. Stable postoperative changes at L3-4 and L4-5 with moderate multifactorial spinal stenosis at L4-5. 4. Unchanged asymmetric narrowing of the left lateral recess at L3-4 with moderate left greater than right foraminal narrowing. 5. Mild-to-moderate spinal stenosis at L5-S1 with moderate right-greater-than-left foraminal narrowing.     Electronically Signed   By: Carey Bullocks M.D.   On: 10/16/2023 17:10  09/20/23 lumbar xrays   FINDINGS: Degenerative changes L3 through S1. Discectomy L4-5. Grade 1 L5 retrolisthesis. No compression deformities. No motion with flexion and extension to suggest instability. No focal osteolytic or osteoblastic changes. Aortoiliac atheromatous calcifications.   IMPRESSION: Degenerative and postoperative changes. No acute osseous abnormalities. Aortoiliac atherosclerotic vascular disease.     Electronically Signed   By: Layla Maw M.D.   On: 10/07/2023 11:00  IMPRESSION  Mr. Santora is a 77 y.o. male who I performed a telephone encounter today for evaluation and management of: Degenerative disc disease, facet arthrosis, and lumbar stenosis.  PLAN  Mr. Balfanz is a very pleasant 77 year old presenting today via telephone visit to review his lumbar x-rays and updated lumbar MRI.  He seems to have had some improvement with continuing to be active at the gym and describes only discomfort in his back without radicular symptoms.  Encouraged him to continue with his appointment with orthopedics in regards to his shoulder.  While his imaging does show significant degenerative change in his spine, he does not seem to be terribly symptomatic from this and is not at a point where he wants to consider any surgical intervention.  We will see him going forward on an as-needed basis and he was encouraged to call our office should he have any recurrent symptoms at which time we will likely schedule him to see one of the surgeons to  discuss possible surgical intervention.  He expressed understanding and was in agreement with this plan.  No orders of the defined types were placed in this encounter.   DISPOSITION  Follow up: In person appointment in  PRN  Susanne Borders, PA   TELEPHONE DOCUMENTATION   This visit was performed via telephone.  Patient location: home Provider location: office  I spent a total of 5 minutes non-face-to-face activities for this visit on the date of this encounter including review of current clinical condition and response to treatment.  The patient is aware of and accepts the limits of this telehealth visit.

## 2023-10-26 DIAGNOSIS — H401131 Primary open-angle glaucoma, bilateral, mild stage: Secondary | ICD-10-CM | POA: Diagnosis not present

## 2023-10-31 ENCOUNTER — Other Ambulatory Visit: Payer: Self-pay | Admitting: Family Medicine

## 2023-11-04 ENCOUNTER — Other Ambulatory Visit: Payer: Self-pay

## 2023-11-07 NOTE — Patient Outreach (Signed)
  Care Management   Visit Note   Name: Brett Bender MRN: [id number] DOB: 06-14-1946  Subjective: Brett Bender is a 77 y.o. year old male who is a primary care patient of Fisher, Demetrios Isaacs, MD. The Care Management team was consulted for assistance.      Engaged with Brett Bender via telephone.  Assessment:  Review of patient past medical history, allergies, medications, health status, including review of consultants reports, laboratory and other test data, was performed as part of  evaluation and provision of care management services.    Outpatient Encounter Medications as of 11/04/2023  Medication Sig Note   amLODipine (NORVASC) 10 MG tablet TAKE ONE TABLET BY MOUTH ONCE A DAY    aspirin 81 MG chewable tablet Chew by mouth daily.    atenolol-chlorthalidone (TENORETIC) 50-25 MG tablet TAKE ONE HALF (1/2) TABLET BY MOUTH ONCE DAILY    diclofenac (VOLTAREN) 50 MG EC tablet TAKE ONE TABLET BY MOUTH TWICE DAILY AS NEEDED    diclofenac Sodium (VOLTAREN) 1 % GEL APPLY 4G TOPICALLY FIVE TIMES DAILY    finasteride (PROSCAR) 5 MG tablet Take 1 tablet (5 mg total) by mouth daily.    ketoconazole (NIZORAL) 2 % shampoo 3 times weekly lather on scalp, leave on 5-8 minutes, rinse well.    latanoprost (XALATAN) 0.005 % ophthalmic solution Place 1 drop into both eyes at bedtime.     mirtazapine (REMERON) 30 MG tablet TAKE ONE TABLET BY MOUTH EVERY NIGHT AT BEDTIME    Multiple Vitamin (MULTIVITAMIN) tablet Take 1 tablet by mouth daily.    mupirocin ointment (BACTROBAN) 2 % Apply once daily with bandage change    Probiotic Product (DIGESTIVE ADVANTAGE PO) Take 1 tablet by mouth daily.    psyllium (METAMUCIL) 58.6 % powder Take 1 packet by mouth daily as needed (regularity).     rosuvastatin (CRESTOR) 20 MG tablet Take 1 tablet (20 mg total) by mouth daily.    tamsulosin (FLOMAX) 0.4 MG CAPS capsule Take 1 capsule (0.4 mg total) by mouth daily.    timolol (TIMOPTIC) 0.5 % ophthalmic solution Place 1 drop  into both eyes daily.     traMADol (ULTRAM) 50 MG tablet TAKE ONE TABLET (50 MG TOTAL) BY MOUTH EVERY EIGHT (EIGHT) HOURS AS NEEDED. DO NOT TAKE WITH GABAPENTIN OR TIZANIDINE 10/19/2023: Reports pain has been well controlled. Does not require frequent use.   No facility-administered encounter medications on file as of 11/04/2023.

## 2023-11-16 DIAGNOSIS — M19012 Primary osteoarthritis, left shoulder: Secondary | ICD-10-CM | POA: Diagnosis not present

## 2023-12-14 DIAGNOSIS — M1712 Unilateral primary osteoarthritis, left knee: Secondary | ICD-10-CM | POA: Diagnosis not present

## 2023-12-20 ENCOUNTER — Other Ambulatory Visit: Payer: Self-pay | Admitting: Family Medicine

## 2023-12-20 DIAGNOSIS — F32A Depression, unspecified: Secondary | ICD-10-CM

## 2023-12-20 DIAGNOSIS — G4700 Insomnia, unspecified: Secondary | ICD-10-CM

## 2024-01-09 ENCOUNTER — Other Ambulatory Visit: Payer: Self-pay | Admitting: Family Medicine

## 2024-01-09 DIAGNOSIS — I10 Essential (primary) hypertension: Secondary | ICD-10-CM

## 2024-01-10 NOTE — Telephone Encounter (Signed)
 Requested Prescriptions  Pending Prescriptions Disp Refills   amLODipine (NORVASC) 10 MG tablet [Pharmacy Med Name: AMLODIPINE BESYLATE 10MG  TABLET] 90 tablet 0    Sig: TAKE ONE TABLET BY MOUTH ONCE A DAY     Cardiovascular: Calcium Channel Blockers 2 Failed - 01/10/2024  4:15 PM      Failed - Last BP in normal range    BP Readings from Last 1 Encounters:  09/20/23 (!) 140/86         Failed - Valid encounter within last 6 months    Recent Outpatient Visits           6 months ago Positive self-administered antigen test for COVID-19   [facility]ue, Monico Blitz, DO   8 months ago Elevated lipids   [facility] Malva Limes, MD   10 months ago Lumbar radiculopathy   [facility] Malva Limes, MD   10 months ago Lumbar radiculopathy   [facility] [name], PA-C   10 months ago Hospital discharge follow-up   [facility] Jacky Kindle, FNP       Future Appointments             In 2 months Deirdre Evener, MD [facility]   In 5 months McGowan, Elana Alm [facility] [place]            Passed - Last Heart Rate in normal range    Pulse Readings from Last 1 Encounters:  06/10/23 (!) 56

## 2024-01-24 ENCOUNTER — Ambulatory Visit: Payer: Medicare Other

## 2024-01-24 VITALS — BP 132/76 | Ht 75.0 in | Wt 208.0 lb

## 2024-01-24 DIAGNOSIS — Z0000 Encounter for general adult medical examination without abnormal findings: Secondary | ICD-10-CM

## 2024-01-24 NOTE — Progress Notes (Signed)
 Subjective:   Brett Bender is a 78 y.o. who presents for a Medicare Wellness preventive visit.  Visit Complete: In person  Persons Participating in Visit: Patient.  AWV Questionnaire: No: Patient Medicare AWV questionnaire was not completed prior to this visit.  Cardiac Risk Factors include: advanced age (>74men, >55 women);hypertension;male gender     Objective:    Today's Vitals   01/24/24 1000  BP: 132/76  Weight: 208 lb (94.3 kg)  Height: 6\' 3"  (1.905 m)   Body mass index is 26 kg/m.     01/24/2024   10:11 AM 03/03/2023    8:00 AM 03/02/2023    9:05 PM 02/12/2023    4:19 PM 02/12/2023    4:00 PM 01/04/2023    1:16 PM 12/31/2021    1:15 PM  Advanced Directives  Does Patient Have a Medical Advance Directive? No Yes Yes  No Yes No  Type of Advance Directive  Living will Living will   Healthcare Power of [place];Living will   Does patient want to make changes to medical advance directive?  No - Patient declined       Copy of Healthcare Power of Attorney in Chart?      No - copy requested   Would patient like information on creating a medical advance directive? No - Patient declined No - Patient declined No - Patient declined No - Patient declined   No - Patient declined    Current Medications (verified) Outpatient Encounter Medications as of 01/24/2024  Medication Sig   amLODipine (NORVASC) 10 MG tablet TAKE ONE TABLET BY MOUTH ONCE A DAY   aspirin 81 MG chewable tablet Chew by mouth daily.   atenolol-chlorthalidone (TENORETIC) 50-25 MG tablet TAKE ONE HALF (1/2) TABLET BY MOUTH ONCE DAILY   diclofenac (VOLTAREN) 50 MG EC tablet TAKE ONE TABLET BY MOUTH TWICE DAILY AS NEEDED   diclofenac Sodium (VOLTAREN) 1 % GEL APPLY 4G TOPICALLY FIVE TIMES DAILY   finasteride (PROSCAR) 5 MG tablet Take 1 tablet (5 mg total) by mouth daily.   ketoconazole (NIZORAL) 2 % shampoo 3 times weekly lather on scalp, leave on 5-8 minutes, rinse well.   latanoprost (XALATAN) 0.005 % ophthalmic  solution Place 1 drop into both eyes at bedtime.    mirtazapine (REMERON) 30 MG tablet TAKE ONE TABLET BY MOUTH EVERY NIGHT AT BEDTIME   Multiple Vitamin (MULTIVITAMIN) tablet Take 1 tablet by mouth daily.   mupirocin ointment (BACTROBAN) 2 % Apply once daily with bandage change   Probiotic Product (DIGESTIVE ADVANTAGE PO) Take 1 tablet by mouth daily.   psyllium (METAMUCIL) 58.6 % powder Take 1 packet by mouth daily as needed (regularity).    rosuvastatin (CRESTOR) 20 MG tablet Take 1 tablet (20 mg total) by mouth daily.   tamsulosin (FLOMAX) 0.4 MG CAPS capsule Take 1 capsule (0.4 mg total) by mouth daily.   timolol (TIMOPTIC) 0.5 % ophthalmic solution Place 1 drop into both eyes daily.    traMADol (ULTRAM) 50 MG tablet TAKE ONE TABLET (50 MG TOTAL) BY MOUTH EVERY EIGHT (EIGHT) HOURS AS NEEDED. DO NOT TAKE WITH GABAPENTIN OR TIZANIDINE   No facility-administered encounter medications on file as of 01/24/2024.    Allergies (verified) Penicillins   History: Past Medical History:  Diagnosis Date   Actinic keratosis    Arthritis    Basal cell carcinoma 11/09/2013   R lateral edge of clavicle   Basal cell carcinoma 02/03/2016   L lateral supraclavicular base of neck/excision  Basal cell carcinoma 11/29/2018   L upper back   Basal cell carcinoma 09/11/2020   R mid back    Basal cell carcinoma 03/17/2023   right inferior post auricular, excised 04/19/23   Basal cell carcinoma 09/15/2023   Left Superior Posterior Shoulder, EDC   Cancer (HCC)    Basal cell carcinoma bilateral arms and left shoulder   Complication of anesthesia    slow to wake up with shoulder surgery   Depression    Enlarged prostate    H/O elbow surgery    left   Herpes simplex    History of kidney stones    History of MRSA infection 2004   skin around eye   Hypertension    Kidney stone    Lumbar stenosis    Migraine headache    Motion sickness    ocean boats   PONV (postoperative nausea and vomiting)     Tendonitis of ankle, left    Past Surgical History:  Procedure Laterality Date   APPENDECTOMY     back injection     BACK SURGERY     CYSTOSCOPY/URETEROSCOPY/HOLMIUM LASER/STENT PLACEMENT Left 12/18/2015   Procedure: CYSTOSCOPY/URETEROSCOPY/HOLMIUM LASER LITHOTRIPSY;  Surgeon: Vanna Scotland, MD;  Location: [facility] ORS;  Service: Urology;  Laterality: Left;   ELBOW SURGERY     IR LUMBAR DISC ASPIRATION W/IMG GUIDE  03/03/2023   LOWER BACK SURGERY  2010   L4-5 fusion   LUMBAR LAMINECTOMY/DECOMPRESSION MICRODISCECTOMY N/A 08/21/2018   Procedure: LUMBAR LAMINECTOMY/DECOMPRESSION MICRODISCECTOMY 1 LEVEL-L3/4;  Surgeon: Lucy Chris, MD;  Location: [facility] ORS;  Service: Neurosurgery;  Laterality: N/A;   NECK SURGERY     Upper neck surgery; with wires placed   SHOULDER SURGERY Right 11/10/2011   arthroscopic surgery . Dr. Katrinka Blazing, [facility]   TEE WITHOUT CARDIOVERSION N/A 02/11/2023   Procedure: TRANSESOPHAGEAL ECHOCARDIOGRAM;  Surgeon: Debbe Odea, MD;  Location: [facility] ORS;  Service: Cardiovascular;  Laterality: N/A;   TONSILLECTOMY     TRICEPS TENDON REPAIR Left 12/04/2019   Procedure: TRICEPS TENDON REPAIR, OLECRANON BURSA;  Surgeon: Signa Kell, MD;  Location: [facility];  Service: Orthopedics;  Laterality: Left;   Family History  Problem Relation Age of Onset   Depression Son    Diabetes Neg Hx    Cancer Neg Hx    Heart attack Neg Hx    Kidney disease Neg Hx    Prostate cancer Neg Hx    Kidney cancer Neg Hx    Bladder Cancer Neg Hx    Social History   Socioeconomic History   Marital status: Married    Spouse name: Not on file   Number of children: 2   Years of education: Not on file   Highest education level: Associate degree: occupational, Scientist, product/process development, or vocational program  Occupational History   Occupation: Retired    Comment: former Programmer, systems  Tobacco Use   Smoking status: Never   Smokeless tobacco: Never  Vaping Use   Vaping status: Never Used   Substance and Sexual Activity   Alcohol use: No    Alcohol/week: 0.0 standard drinks of alcohol   Drug use: No   Sexual activity: Yes  Other Topics Concern   Not on file  Social History Narrative   Not on file   Social Drivers of Health   Financial Resource Strain: Low Risk  (01/24/2024)   Overall Financial Resource Strain (CARDIA)    Difficulty of Paying Living Expenses: Not hard at all  Food Insecurity: No Food Insecurity (  01/24/2024)   Hunger Vital Sign    Worried About Running Out of Food in the Last Year: Never true    Ran Out of Food in the Last Year: Never true  Transportation Needs: No Transportation Needs (01/24/2024)   PRAPARE - Administrator, Civil Service (Medical): No    Lack of Transportation (Non-Medical): No  Physical Activity: Sufficiently Active (01/24/2024)   Exercise Vital Sign    Days of Exercise per Week: 5 days    Minutes of Exercise per Session: 90 min  Stress: No Stress Concern Present (01/24/2024)   [employer] of Occupational Health - Occupational Stress Questionnaire    Feeling of Stress : Not at all  Social Connections: Moderately Integrated (01/24/2024)   Social Connection and Isolation Panel [NHANES]    Frequency of Communication with Friends and Family: More than three times a week    Frequency of Social Gatherings with Friends and Family: Once a week    Attends Religious Services: More than 4 times per year    Active Member of [employer] or Organizations: No    Attends Engineer, structural: Never    Marital Status: Married    Tobacco Counseling Counseling given: Not Answered    Clinical Intake:  Pre-visit preparation completed: Yes  Pain : No/denies pain     BMI - recorded: 26 Nutritional Status: BMI 25 -29 Overweight Nutritional Risks: None Diabetes: No  How often do you need to have someone help you when you read instructions, pamphlets, or other written materials from your doctor or pharmacy?: 1 -  Never  Interpreter Needed?: No  Information entered by :: Kennedy Bucker, LPN   Activities of Daily Living     01/24/2024   10:13 AM 05/11/2023    9:33 AM  In your present state of health, do you have any difficulty performing the following activities:  Hearing? 0 0  Vision? 0 0  Difficulty concentrating or making decisions? 0 0  Walking or climbing stairs? 0 1  Comment HAS TO BE CAREFUL   Dressing or bathing? 0 0  Doing errands, shopping? 0 1  Preparing Food and eating ? N   Using the Toilet? N   In the past six months, have you accidently leaked urine? N   Do you have problems with loss of bowel control? N   Managing your Medications? N   Managing your Finances? N   Housekeeping or managing your Housekeeping? N     Patient Care Team: Malva Limes, MD as PCP - General (Family Medicine) Hulan Fray as Physician Assistant (Urology) Marlene Bast as Consulting Physician (Optometry) Deirdre Evener, MD (Dermatology) Juanell Fairly, RN as Registered Nurse  Indicate any recent Medical Services you may have received from other than [facility] providers in the past year (date may be approximate).     Assessment:   This is a routine wellness examination for [place].  Hearing/Vision screen Hearing Screening - Comments:: NO AIDS Vision Screening - Comments:: WEARS GLASSES ALL THE TIME- [place]   Goals Addressed             This Visit's Progress    Cut out extra servings         Depression Screen     01/24/2024   10:09 AM 05/11/2023    9:32 AM 03/22/2023   11:15 AM 02/23/2023   10:47 AM 01/04/2023    1:13 PM 12/31/2021    1:14 PM 05/28/2021  1:23 PM  PHQ 2/9 Scores  PHQ - 2 Score 0 1 1 5  0 0 0  PHQ- 9 Score 0 5  20       Fall Risk     01/24/2024   10:12 AM 05/11/2023    9:32 AM 03/22/2023   11:15 AM 02/23/2023   10:46 AM 01/04/2023    1:09 PM  Fall Risk   Falls in the past year? 0 1 0 0 0  Number falls in past yr: 0 1  0 0  Injury with Fall? 0  0   0  Risk for fall due to : No Fall Risks  No Fall Risks History of fall(s);Impaired mobility;Impaired balance/gait No Fall Risks  Follow up Falls prevention discussed;Falls evaluation completed  Falls evaluation completed  Education provided;Falls prevention discussed    MEDICARE RISK AT HOME:  Medicare Risk at Home Any stairs in or around the home?: No If so, are there any without handrails?: No Home free of loose throw rugs in walkways, pet beds, electrical cords, etc?: Yes Adequate lighting in your home to reduce risk of falls?: Yes Life alert?: No Use of a cane, walker or w/c?: No Grab bars in the bathroom?: Yes Shower chair or bench in shower?: Yes Elevated toilet seat or a handicapped toilet?: Yes  TIMED UP AND GO:  Was the test performed?  Yes  Length of time to ambulate 10 feet: 4 sec Gait steady and fast without use of assistive device  Cognitive Function: 6CIT completed        01/24/2024   10:16 AM 01/04/2023    1:19 PM 12/17/2016    2:16 PM  6CIT Screen  What Year? 0 points 0 points 0 points  What month? 0 points 0 points 0 points  What time? 0 points 0 points 0 points  Count back from 20 0 points 0 points 0 points  Months in reverse 0 points 0 points 0 points  Repeat phrase 0 points 0 points 0 points  Total Score 0 points 0 points 0 points    Immunizations Immunization History  Administered Date(s) Administered   Fluad Quad(high Dose 65+) 08/03/2019, 08/26/2020, 09/09/2021, 08/03/2022   Fluad Trivalent(High Dose 65+) 08/15/2023   Influenza Split 09/03/2006, 08/12/2007, 07/26/2008, 08/09/2012   Influenza, High Dose Seasonal PF 08/10/2014, 08/18/2015, 08/10/2016, 07/28/2017, 08/16/2018   Influenza,inj,Quad PF,6+ Mos 07/31/2013   PFIZER(Purple Top)SARS-COV-2 Vaccination 12/26/2019, 01/16/2020, 08/14/2020   Pneumococcal Conjugate-13 11/27/2014   Pneumococcal Polysaccharide-23 12/22/2011   Tdap 12/22/2011   Zoster Recombinant(Shingrix) 05/25/2017, 07/28/2017    Zoster, Live 11/26/2011    Screening Tests Health Maintenance  Topic Date Due   DTaP/Tdap/Td (2 - Td or Tdap) 12/21/2021   COVID-19 Vaccine (4 - 2024-25 season) 07/10/2023   Medicare Annual Wellness (AWV)  01/23/2025   Pneumonia Vaccine 81+ Years old  Completed   INFLUENZA VACCINE  Completed   Hepatitis C Screening  Completed   Zoster Vaccines- Shingrix  Completed   HPV VACCINES  Aged Out   Colonoscopy  Discontinued    Health Maintenance  Health Maintenance Due  Topic Date Due   DTaP/Tdap/Td (2 - Td or Tdap) 12/21/2021   COVID-19 Vaccine (4 - 2024-25 season) 07/10/2023   Health Maintenance Items Addressed: UP TO DATE AGED OUT OF COLONOSCOPY  Additional Screening:  Vision Screening: Recommended annual ophthalmology exams for early detection of glaucoma and other disorders of the eye.  Dental Screening: Recommended annual dental exams for proper oral hygiene  Community Resource Referral /  Chronic Care Management: CRR required this visit?  No   CCM required this visit?  No     Plan:     I have personally reviewed and noted the following in the patient's chart:   Medical and social history Use of alcohol, tobacco or illicit drugs  Current medications and supplements including opioid prescriptions. Patient is not currently taking opioid prescriptions. Functional ability and status Nutritional status Physical activity Advanced directives List of other physicians Hospitalizations, surgeries, and ER visits in previous 12 months Vitals Screenings to include cognitive, depression, and falls Referrals and appointments  In addition, I have reviewed and discussed with patient certain preventive protocols, quality metrics, and best practice recommendations. A written personalized care plan for preventive services as well as general preventive health recommendations were provided to patient.     Hal Hope, LPN   05/07/5283   After Visit Summary: (In  Person-Declined) Patient declined AVS at this time.  Notes: Nothing significant to report at this time.

## 2024-01-24 NOTE — Patient Instructions (Addendum)
 Mr. Witherington , Thank you for taking time to come for your Medicare Wellness Visit. I appreciate your ongoing commitment to your health goals. Please review the following plan we discussed and let me know if I can assist you in the future.   Referrals/Orders/Follow-Ups/Clinician Recommendations: NONE  This is a list of the screening recommended for you and due dates:  Health Maintenance  Topic Date Due   DTaP/Tdap/Td vaccine (2 - Td or Tdap) 12/21/2021   COVID-19 Vaccine (4 - 2024-25 season) 07/10/2023   Medicare Annual Wellness Visit  01/23/2025   Pneumonia Vaccine  Completed   Flu Shot  Completed   Hepatitis C Screening  Completed   Zoster (Shingles) Vaccine  Completed   HPV Vaccine  Aged Out   Colon Cancer Screening  Discontinued    Advanced directives: (ACP Link)Information on Advanced Care Planning can be found at [facility] [place] Advance Health Care Directives Advance Health Care Directives. [email or web address]   Next Medicare Annual Wellness Visit scheduled for next year: Yes   01/29/25 @ 9:30 AM IN PERSON

## 2024-01-24 NOTE — Progress Notes (Signed)
 I have reviewed the health advisor's note, was available for consultation, and agree with documentation and plan  [name], MD

## 2024-02-01 ENCOUNTER — Other Ambulatory Visit: Payer: Self-pay | Admitting: Family Medicine

## 2024-02-02 ENCOUNTER — Other Ambulatory Visit: Payer: Self-pay | Admitting: Family Medicine

## 2024-02-15 DIAGNOSIS — M1712 Unilateral primary osteoarthritis, left knee: Secondary | ICD-10-CM | POA: Diagnosis not present

## 2024-02-15 DIAGNOSIS — M19012 Primary osteoarthritis, left shoulder: Secondary | ICD-10-CM | POA: Diagnosis not present

## 2024-02-29 DIAGNOSIS — M1712 Unilateral primary osteoarthritis, left knee: Secondary | ICD-10-CM | POA: Diagnosis not present

## 2024-03-05 ENCOUNTER — Other Ambulatory Visit: Payer: Self-pay | Admitting: Family Medicine

## 2024-03-16 ENCOUNTER — Ambulatory Visit (INDEPENDENT_AMBULATORY_CARE_PROVIDER_SITE_OTHER): Admitting: Family Medicine

## 2024-03-16 ENCOUNTER — Encounter: Payer: Self-pay | Admitting: Family Medicine

## 2024-03-16 VITALS — BP 125/73 | HR 55 | Temp 97.8°F | Resp 16 | Ht 75.5 in | Wt 207.8 lb

## 2024-03-16 DIAGNOSIS — I2510 Atherosclerotic heart disease of native coronary artery without angina pectoris: Secondary | ICD-10-CM | POA: Diagnosis not present

## 2024-03-16 DIAGNOSIS — K5900 Constipation, unspecified: Secondary | ICD-10-CM | POA: Diagnosis not present

## 2024-03-16 DIAGNOSIS — D3502 Benign neoplasm of left adrenal gland: Secondary | ICD-10-CM | POA: Insufficient documentation

## 2024-03-16 DIAGNOSIS — R7309 Other abnormal glucose: Secondary | ICD-10-CM | POA: Diagnosis not present

## 2024-03-16 DIAGNOSIS — I700 Atherosclerosis of aorta: Secondary | ICD-10-CM | POA: Diagnosis not present

## 2024-03-16 DIAGNOSIS — I10 Essential (primary) hypertension: Secondary | ICD-10-CM

## 2024-03-16 DIAGNOSIS — F32A Depression, unspecified: Secondary | ICD-10-CM

## 2024-03-16 NOTE — Patient Instructions (Addendum)
 Please review the attached list of medications and notify my office if there are any errors.   Please bring all of your medications to every appointment so we can make sure that our medication list is the same as yours.   I recommend that you get the RSV vaccine (Arexvy) to protect yourself from certain types of viral pneumonia. This vaccine is typically covered under Medicare prescription plans and should be covered if you get it at your pharmacy.

## 2024-03-16 NOTE — Progress Notes (Signed)
 Established patient visit   Patient: Brett Bender   DOB: 03-19-1946   78 y.o. Male  MRN: [id number] Visit Date: 03/16/2024  Today's healthcare provider: Jeralene Mom, MD   Chief Complaint  Patient presents with   Annual Exam    Had AWV done 01/24/2024 Patient overall feeling well. He is sleeping well. He is exercising.   Subjective    Discussed the use of AI scribe software for clinical note transcription with the patient, who gave verbal consent to proceed.  History of Present Illness   Brett Bender is a 78 year old male who presents for routine follow up of chronic medical conditions including hypertension, depression, hyperlipidemia, BPH, and insomnia.   He has been experiencing ongoing issues with his left knee for several weeks. A cortisone shot provided minimal relief, and a gel injection three weeks ago also did not significantly improve his symptoms. He is considering using a knee brace to alleviate discomfort. The knee pain has made playing golf challenging, and he has decided not to play on the upcoming Monday due to the severity of his symptoms.  He has a history of a disciits of lower spine last year. His back pain is mostly back to baseline, but has a bit more trouble ambulating and with balance. although he has been able to start golfing again and remains active by going to the gym regularly.  He is experiencing constipation, characterized by difficulty with bowel movements and the passage of small, hard stools. He continues to take Metamucil to aid with bowel regularity.  He is currently taking Flomax  and finasteride  prescribed by urology without issues.    He also takes mirtazapine  every night, which is working well for him. He plans to call in for a refill soon. He reports he is sleeping well and not feeling depressed lately.   He has received pneumonia and shingles vaccines but has not yet received the RSV vaccine. No hearing problems or ringing in his  ears. No swelling in his hands, feet, or ankles.     Patient Care Team: Lamon Pillow, MD as PCP - General (Family Medicine) Cleta Daisy as Physician Assistant (Urology) Ric Chain as Consulting Physician (Optometry) Elta Halter, MD (Dermatology) Roxie Cord, RN as Registered Nurse   Medications: Outpatient Medications Prior to Visit  Medication Sig   amLODipine  (NORVASC ) 10 MG tablet TAKE ONE TABLET BY MOUTH ONCE A DAY   aspirin  81 MG chewable tablet Chew by mouth daily.   atenolol -chlorthalidone  (TENORETIC ) 50-25 MG tablet TAKE ONE HALF (1/2) TABLET BY MOUTH ONCE DAILY   diclofenac  (VOLTAREN ) 50 MG EC tablet TAKE ONE TABLET BY MOUTH TWICE DAILY AS NEEDED   diclofenac  Sodium (VOLTAREN ) 1 % GEL APPLY 4G TOPICALLY FIVE TIMES DAILY   finasteride  (PROSCAR ) 5 MG tablet Take 1 tablet (5 mg total) by mouth daily.   ketoconazole  (NIZORAL ) 2 % shampoo 3 times weekly lather on scalp, leave on 5-8 minutes, rinse well.   latanoprost  (XALATAN ) 0.005 % ophthalmic solution Place 1 drop into both eyes at bedtime.    mirtazapine  (REMERON ) 30 MG tablet TAKE ONE TABLET BY MOUTH EVERY NIGHT AT BEDTIME   Multiple Vitamin (MULTIVITAMIN) tablet Take 1 tablet by mouth daily.   mupirocin  ointment (BACTROBAN ) 2 % Apply once daily with bandage change   Probiotic Product (DIGESTIVE ADVANTAGE PO) Take 1 tablet by mouth daily.   psyllium (METAMUCIL) 58.6 % powder Take 1 packet by mouth daily  as needed (regularity).    rosuvastatin  (CRESTOR ) 20 MG tablet TAKE ONE TABLET (20 MG TOTAL) BY MOUTH DAILY.   tamsulosin  (FLOMAX ) 0.4 MG CAPS capsule Take 1 capsule (0.4 mg total) by mouth daily.   timolol  (TIMOPTIC ) 0.5 % ophthalmic solution Place 1 drop into both eyes daily.    traMADol  (ULTRAM ) 50 MG tablet TAKE ONE TABLET (50 MG TOTAL) BY MOUTH EVERY EIGHT (EIGHT) HOURS AS NEEDED. DO NOT TAKE WITH GABAPENTIN  OR TIZANIDINE    No facility-administered medications prior to visit.   Review  of Systems     Objective     BP 125/73 (BP Location: Left Arm, Patient Position: Sitting, Cuff Size: Normal)   Pulse (!) 55   Temp 97.8 F (36.6 C) (Oral)   Resp 16   Ht 6' 3.5" (1.918 m)   Wt 207 lb 12.8 oz (94.3 kg)   SpO2 96%   BMI 25.63 kg/m   Physical Exam   General: Appearance:    Well developed, well nourished male in no acute distress  Eyes:    PERRL, conjunctiva/corneas clear, EOM's intact       ENT:   NP/OP pink and clear. Ear canals nearly obstructed with cerumen.   Lungs:     Clear to auscultation bilaterally, respirations unlabored  Heart:    Bradycardic. Normal rhythm. No murmurs, rubs, or gallops.    MS:   All extremities are intact.    Neurologic:   Awake, alert, oriented x 3. No apparent focal neurological defect.   Derm:    Extensive actinic keratoses, neck back, and arms.      Assessment & Plan     1. Atherosclerosis of native coronary artery of native heart without angina pectoris (Primary) Asymptomatic. Compliant with medication.  Continue aggressive risk factor modification.   - CBC - Lipid panel - Comprehensive metabolic panel with GFR  2. Essential (primary) hypertension Well controlled.  Continue current medications.    3. Depression, unspecified depression type Doing well with mirtazapine  every night.   4. Elevated glucose  - Hemoglobin A1c  5. Adrenal adenoma, left   6. Aortic atherosclerosis (HCC) Asymptomatic. Compliant with medication.  Continue aggressive risk factor modification.    7. Constipation, unspecified constipation type Is going to increase fiber consumption.  - TSH   Last colonoscopy was in 2014 and was previously told doesn't need to have done anymore. However I discussed may need to get back with gastro and discuss follow up colonoscopy if constipation does not improve with increase fiber intake.      Wellness Visit Routine wellness visit to assess overall health and manage chronic conditions. - Perform blood  work to check thyroid  and metabolic function. - Review blood pressure and cholesterol levels. - Ensure vaccinations are up to date.  Left knee osteoarthritis Chronic left knee osteoarthritis with persistent pain despite cortisone and gel injections. Considering knee replacement but undecided. - Consider knee brace for support. - Discuss potential knee replacement if symptoms persist.  Chronic constipation Chronic constipation with hard, pellet-like stools. Possible metabolic or thyroid  issues to be ruled out with blood work. - Increase dietary fiber with bran flakes and yogurt. - Continue Metamucil. - Perform blood work to check for thyroid  and metabolic issues.  Depression Depression managed with mirtazapine , which is effective. Mood is well-managed. - Continue mirtazapine . - Ensure medication refills are up to date.  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 No significant hearing issues reported. - Use Debrox ear  drops to help remove earwax.  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 Full recovery not expected due to age and severity of infection. - Encourage staying active to maintain function.  General Health Maintenance He is up to date on pneumonia and shingles vaccines. RSV vaccine is recommended for those over 70. - Recommend RSV vaccine from pharmacy before flu season.    No follow-ups on file.      Jeralene Mom, MD  [facility] [phone] (phone) [phone] (fax)  [facility]

## 2024-03-17 LAB — COMPREHENSIVE METABOLIC PANEL WITH GFR
ALT: 37 IU/L (ref 0–44)
AST: 26 IU/L (ref 0–40)
Albumin: 4.4 g/dL (ref 3.8–4.8)
Alkaline Phosphatase: 86 IU/L (ref 44–121)
BUN/Creatinine Ratio: 19 (ref 10–24)
BUN: 20 mg/dL (ref 8–27)
Bilirubin Total: 0.9 mg/dL (ref 0.0–1.2)
CO2: 26 mmol/L (ref 20–29)
Calcium: 10 mg/dL (ref 8.6–10.2)
Chloride: 104 mmol/L (ref 96–106)
Creatinine, Ser: 1.04 mg/dL (ref 0.76–1.27)
Globulin, Total: 2.4 g/dL (ref 1.5–4.5)
Glucose: 106 mg/dL — ABNORMAL HIGH (ref 70–99)
Potassium: 4.5 mmol/L (ref 3.5–5.2)
Sodium: 143 mmol/L (ref 134–144)
Total Protein: 6.8 g/dL (ref 6.0–8.5)
eGFR: 74 mL/min/{1.73_m2} (ref >59)

## 2024-03-17 LAB — CBC
Hematocrit: 46.5 % (ref 37.5–51.0)
Hemoglobin: 15.6 g/dL (ref 13.0–17.7)
MCH: 31.8 pg (ref 26.6–33.0)
MCHC: 33.5 g/dL (ref 31.5–35.7)
MCV: 95 fL (ref 79–97)
Platelets: 246 10*3/uL (ref 150–450)
RBC: 4.91 x10E6/uL (ref 4.14–5.80)
RDW: 13.1 % (ref 11.6–15.4)
WBC: 9.1 10*3/uL (ref 3.4–10.8)

## 2024-03-17 LAB — LIPID PANEL
Chol/HDL Ratio: 2.4 ratio (ref 0.0–5.0)
Cholesterol, Total: 124 mg/dL (ref 100–199)
HDL: 51 mg/dL (ref >39)
LDL Chol Calc (NIH): 53 mg/dL (ref 0–99)
Triglycerides: 111 mg/dL (ref 0–149)
VLDL Cholesterol Cal: 20 mg/dL (ref 5–40)

## 2024-03-17 LAB — TSH: TSH: 1.41 u[IU]/mL (ref 0.450–4.500)

## 2024-03-17 LAB — HEMOGLOBIN A1C
Est. average glucose Bld gHb Est-mCnc: 123 mg/dL
Hgb A1c MFr Bld: 5.9 % — ABNORMAL HIGH (ref 4.8–5.6)

## 2024-03-18 ENCOUNTER — Encounter: Payer: Self-pay | Admitting: Family Medicine

## 2024-03-28 ENCOUNTER — Encounter: Payer: Self-pay | Admitting: Dermatology

## 2024-03-28 ENCOUNTER — Ambulatory Visit (INDEPENDENT_AMBULATORY_CARE_PROVIDER_SITE_OTHER): Payer: Medicare Other | Admitting: Dermatology

## 2024-03-28 DIAGNOSIS — L814 Other melanin hyperpigmentation: Secondary | ICD-10-CM | POA: Diagnosis not present

## 2024-03-28 DIAGNOSIS — D692 Other nonthrombocytopenic purpura: Secondary | ICD-10-CM

## 2024-03-28 DIAGNOSIS — Z7189 Other specified counseling: Secondary | ICD-10-CM | POA: Diagnosis not present

## 2024-03-28 DIAGNOSIS — D1801 Hemangioma of skin and subcutaneous tissue: Secondary | ICD-10-CM

## 2024-03-28 DIAGNOSIS — Z5111 Encounter for antineoplastic chemotherapy: Secondary | ICD-10-CM

## 2024-03-28 DIAGNOSIS — L570 Actinic keratosis: Secondary | ICD-10-CM | POA: Diagnosis not present

## 2024-03-28 DIAGNOSIS — L578 Other skin changes due to chronic exposure to nonionizing radiation: Secondary | ICD-10-CM | POA: Diagnosis not present

## 2024-03-28 DIAGNOSIS — L281 Prurigo nodularis: Secondary | ICD-10-CM

## 2024-03-28 DIAGNOSIS — Z1283 Encounter for screening for malignant neoplasm of skin: Secondary | ICD-10-CM | POA: Diagnosis not present

## 2024-03-28 DIAGNOSIS — Z79899 Other long term (current) drug therapy: Secondary | ICD-10-CM

## 2024-03-28 DIAGNOSIS — Z872 Personal history of diseases of the skin and subcutaneous tissue: Secondary | ICD-10-CM

## 2024-03-28 DIAGNOSIS — W908XXA Exposure to other nonionizing radiation, initial encounter: Secondary | ICD-10-CM

## 2024-03-28 DIAGNOSIS — Z85828 Personal history of other malignant neoplasm of skin: Secondary | ICD-10-CM | POA: Diagnosis not present

## 2024-03-28 DIAGNOSIS — D229 Melanocytic nevi, unspecified: Secondary | ICD-10-CM

## 2024-03-28 MED ORDER — FLUOROURACIL 5 % EX CREA: TOPICAL_CREAM | CUTANEOUS | 2 refills | Status: DC

## 2024-03-28 NOTE — Patient Instructions (Addendum)
 Cryotherapy Aftercare  Wash gently with soap and water everyday.   Apply Vaseline Jelly daily until healed.   Start May 12, 2024 twice daily for 7 days to forehead and temples, treat chest twice daily for 7-10 days in August 2025.  - Start 5-fluorouracil /calcipotriene cream twice a day for 7 days to affected areas including forehead and temples. Prescription sent to [facility]. Patient advised they will receive an email to purchase the medication online and have it sent to their home. Patient provided with handout reviewing treatment course and side effects and advised to call or message us  on MyChart with any concerns.  Reviewed course of treatment and expected reaction.  Patient advised to expect inflammation and crusting and advised that erosions are possible.  Patient advised to be diligent with sun protection during and after treatment. Counseled to keep medication out of reach of children and pets.    Instructions for [facility] Medications  One or more of your medications was sent to the [facility] mail order [facility] pharmacy. You will receive an email from them and can purchase the medicine through that link. It will then be mailed to your home at the address you confirmed. If for any reason you do not receive an email from them, please check your spam folder. If you still do not find the email, please let us  know. [facility] phone number is [phone].    5-Fluorouracil /Calcipotriene Patient Education   Actinic keratoses are the dry, red scaly spots on the skin caused by sun damage. A portion of these spots can turn into skin cancer with time, and treating them can help prevent development of skin cancer.   Treatment of these spots requires removal of the defective skin cells. There are various ways to remove actinic keratoses, including freezing with liquid nitrogen, treatment with creams, or treatment with a blue light procedure in the  office.   5-fluorouracil  cream is a topical cream used to treat actinic keratoses. It works by interfering with the growth of abnormal fast-growing skin cells, such as actinic keratoses. These cells peel off and are replaced by healthy ones.   5-fluorouracil /calcipotriene is a combination of the 5-fluorouracil  cream with a vitamin D analog cream called calcipotriene. The calcipotriene alone does not treat actinic keratoses. However, when it is combined with 5-fluorouracil , it helps the 5-fluorouracil  treat the actinic keratoses much faster so that the same results can be achieved with a much shorter treatment time.  INSTRUCTIONS FOR 5-FLUOROURACIL /CALCIPOTRIENE CREAM:   5-fluorouracil /calcipotriene cream typically only needs to be used for 4-7 days. A thin layer should be applied twice a day to the treatment areas recommended by your physician.   If your physician prescribed you separate tubes of 5-fluourouracil and calcipotriene, apply a thin layer of 5-fluorouracil  followed by a thin layer of calcipotriene.   Avoid contact with your eyes, nostrils, and mouth. Do not use 5-fluorouracil /calcipotriene cream on infected or open wounds.   You will develop redness, irritation and some crusting at areas where you have pre-cancer damage/actinic keratoses. IF YOU DEVELOP PAIN, BLEEDING, OR SIGNIFICANT CRUSTING, STOP THE TREATMENT EARLY - you have already gotten a good response and the actinic keratoses should clear up well.  Wash your hands after applying 5-fluorouracil  5% cream on your skin.   A moisturizer or sunscreen with a minimum SPF 30 should be applied each morning.   Once you have finished the treatment, you can apply a thin layer of Vaseline twice a day to irritated  areas to soothe and calm the areas more quickly. If you experience significant discomfort, contact your physician.  For some patients it is necessary to repeat the treatment for best results.  SIDE EFFECTS: When using  5-fluorouracil /calcipotriene cream, you may have mild irritation, such as redness, dryness, swelling, or a mild burning sensation. This usually resolves within 2 weeks. The more actinic keratoses you have, the more redness and inflammation you can expect during treatment. Eye irritation has been reported rarely. If this occurs, please let us  know.  If you have any trouble using this cream, please call the office. If you have any other questions about this information, please do not hesitate to ask me before you leave the office.   Recommend daily broad spectrum sunscreen SPF 30+ to sun-exposed areas, reapply every 2 hours as needed. Call for new or changing lesions.  Staying in the shade or wearing long sleeves, sun glasses (UVA+UVB protection) and wide brim hats (4-inch brim around the entire circumference of the hat) are also recommended for sun protection.      Melanoma ABCDEs  Melanoma is the most dangerous type of skin cancer, and is the leading cause of death from skin disease.  You are more likely to develop melanoma if you: Have light-colored skin, light-colored eyes, or red or blond hair Spend a lot of time in the sun Tan regularly, either outdoors or in a tanning bed Have had blistering sunburns, especially during childhood Have a close family member who has had a melanoma Have atypical moles or large birthmarks  Early detection of melanoma is key since treatment is typically straightforward and cure rates are extremely high if we catch it early.   The first sign of melanoma is often a change in a mole or a new dark spot.  The ABCDE system is a way of remembering the signs of melanoma.  A for asymmetry:  The two halves do not match. B for border:  The edges of the growth are irregular. C for color:  A mixture of colors are present instead of an even brown color. D for diameter:  Melanomas are usually (but not always) greater than 6mm - the size of a pencil eraser. E for  evolution:  The spot keeps changing in size, shape, and color.  Please check your skin once per month between visits. You can use a small mirror in front and a large mirror behind you to keep an eye on the back side or your body.   If you see any new or changing lesions before your next follow-up, please call to schedule a visit.  Please continue daily skin protection including broad spectrum sunscreen SPF 30+ to sun-exposed areas, reapplying every 2 hours as needed when you're outdoors.   Staying in the shade or wearing long sleeves, sun glasses (UVA+UVB protection) and wide brim hats (4-inch brim around the entire circumference of the hat) are also recommended for sun protection.      Due to recent changes in healthcare laws, you may see results of your pathology and/or laboratory studies on MyChart before the doctors have had a chance to review them. We understand that in some cases there may be results that are confusing or concerning to you. Please understand that not all results are received at the same time and often the doctors may need to interpret multiple results in order to provide you with the best plan of care or course of treatment. Therefore, we ask that you please give  us  2 business days to thoroughly review all your results before contacting the office for clarification. Should we see a critical lab result, you will be contacted sooner.   If You Need Anything After Your Visit  If you have any questions or concerns for your doctor, please call our main line at [phone] and press option 4 to reach your doctor's medical assistant. If no one answers, please leave a voicemail as directed and we will return your call as soon as possible. Messages left after 4 pm will be answered the following business day.   You may also send us  a message via MyChart. We typically respond to MyChart messages within 1-2 business days.  For prescription refills, please ask your pharmacy to contact  our office. Our fax number is [phone].  If you have an urgent issue when the clinic is closed that cannot wait until the next business day, you can page your doctor at the number below.    Please note that while we do our best to be available for urgent issues outside of office hours, we are not available 24/7.   If you have an urgent issue and are unable to reach us , you may choose to seek medical care at your doctor's office, retail clinic, urgent care center, or emergency room.  If you have a medical emergency, please immediately call 911 or go to the emergency department.  Pager Numbers  - Dr. Bary Likes: [phone]  - Dr. Annette Barters: [phone]  - Dr. Felipe Horton: [phone]   In the event of inclement weather, please call our main line at [phone] for an update on the status of any delays or closures.  Dermatology Medication Tips: Please keep the boxes that topical medications come in in order to help keep track of the instructions about where and how to use these. Pharmacies typically print the medication instructions only on the boxes and not directly on the medication tubes.   If your medication is too expensive, please contact our office at [phone] option 4 or send us  a message through MyChart.   We are unable to tell what your co-pay for medications will be in advance as this is different depending on your insurance coverage. However, we may be able to find a substitute medication at lower cost or fill out paperwork to get insurance to cover a needed medication.   If a prior authorization is required to get your medication covered by your insurance company, please allow us  1-2 business days to complete this process.  Drug prices often vary depending on where the prescription is filled and some pharmacies may offer cheaper prices.  The website www.goodrx.com contains coupons for medications through different pharmacies. The prices here do not account for what the  cost may be with help from insurance (it may be cheaper with your insurance), but the website can give you the price if you did not use any insurance.  - You can print the associated coupon and take it with your prescription to the pharmacy.  - You may also stop by our office during regular business hours and pick up a GoodRx coupon card.  - If you need your prescription sent electronically to a different pharmacy, notify our office through [facility] or by phone at [phone] option 4.     Si Usted Necesita Algo Despus de Su Visita  Tambin puede enviarnos un mensaje a travs de Clinical cytogeneticist. Por lo general respondemos a los mensajes de MyChart en el transcurso de 1  a 2 das hbiles.  Para renovar recetas, por favor pida a su farmacia que se ponga en contacto con nuestra oficina. Franz Jacks de fax es [place] [phone].  Si tiene un asunto urgente cuando la clnica est cerrada y que no puede esperar hasta el siguiente da hbil, puede llamar/localizar a su doctor(a) al nmero que aparece a continuacin.   Por favor, tenga en cuenta que aunque hacemos todo lo posible para estar disponibles para asuntos urgentes fuera del horario de [place], no estamos disponibles las 24 horas del da, los 7 [address]  [address] de la [place].   Si tiene un problema urgente y no puede comunicarse con nosotros, puede optar por buscar atencin mdica  en el consultorio de su doctor(a), en una clnica privada, en un centro de atencin urgente o en una sala de emergencias.  Si tiene Engineer, drilling, por favor llame inmediatamente al 911 o vaya a la sala de emergencias.  Nmeros de bper  - Dr. Bary Likes: [phone]  - Dra. Annette Barters: [phone]  - Dr. Felipe Horton: [phone]   En caso de inclemencias del tiempo, por favor llame a Lajuan Pila principal al [phone] para una actualizacin sobre el [place] de cualquier retraso o cierre.  Consejos para la medicacin en dermatologa: Por favor, guarde las cajas  en las que vienen los medicamentos de uso tpico para ayudarle a seguir las instrucciones sobre dnde y cmo usarlos. Las farmacias generalmente imprimen las instrucciones del medicamento slo en las cajas y no directamente en los tubos del [place].   Si su medicamento es muy caro, por favor, pngase en contacto con Bettyjane Brunet llamando al [phone] y presione la opcin 4 o envenos un mensaje a travs de Clinical cytogeneticist.   No podemos decirle cul ser su copago por los medicamentos por adelantado ya que esto es diferente dependiendo de la cobertura de su seguro. Sin embargo, es posible que podamos encontrar un medicamento sustituto a Audiological scientist un formulario para que el seguro cubra el medicamento que se considera necesario.   Si se requiere una autorizacin previa para que su compaa de seguros [place] su medicamento, por favor permtanos de 1 a 2 das hbiles para completar este proceso.  Los precios de los medicamentos varan con frecuencia dependiendo del Environmental consultant de dnde se surte la receta y alguna farmacias pueden ofrecer precios ms baratos.  El sitio web www.goodrx.com tiene cupones para medicamentos de Health and safety inspector. Los precios aqu no tienen en cuenta lo que podra costar con la ayuda del seguro (puede ser ms barato con su seguro), pero el sitio web puede darle el precio si no utiliz Tourist information centre manager.  - Puede imprimir el cupn correspondiente y llevarlo con su receta a la farmacia.  - Tambin puede pasar por nuestra oficina durante el horario de atencin regular y Education officer, museum una tarjeta de cupones de GoodRx.  - Si necesita que su receta se enve electrnicamente a una farmacia diferente, informe a nuestra oficina a travs de MyChart de [place] o por telfono llamando al [phone] y presione la opcin 4.

## 2024-03-28 NOTE — Progress Notes (Signed)
 Follow-Up Visit   Subjective  Brett Bender is a 78 y.o. male who presents for the following: Skin Cancer Screening and Full Body Skin Exam. Hx of multiple BCCs. Hx of AKs. States he sits out in the sun daily for ~1 hour.   Most recent BCC is right inferior post auricular and left superior posterior shoulder. States shoulder took longer to heal.   The patient presents for Total-Body Skin Exam (TBSE) for skin cancer screening and mole check. The patient has spots, moles and lesions to be evaluated, some may be new or changing and the patient may have concern these could be cancer.  The following portions of the chart were reviewed this encounter and updated as appropriate: medications, allergies, medical history  Review of Systems:  No other skin or systemic complaints except as noted in HPI or Assessment and Plan.  Objective  Well appearing patient in no apparent distress; mood and affect are within normal limits.  A full examination was performed including scalp, head, eyes, ears, nose, lips, neck, chest, axillae, abdomen, back, buttocks, bilateral upper extremities, bilateral lower extremities, hands, feet, fingers, toes, fingernails, and toenails. All findings within normal limits unless otherwise noted below.   Relevant physical exam findings are noted in the Assessment and Plan.  forehead, temples, R ear x17 (17) Erythematous thin papules/macules with gritty scale.   Assessment & Plan   SKIN CANCER SCREENING PERFORMED TODAY.  HISTORY OF BASAL CELL CARCINOMA OF THE SKIN. Multiple sites, see history. - No evidence of recurrence today - Recommend regular full body skin exams - Recommend daily broad spectrum sunscreen SPF 30+ to sun-exposed areas, reapply every 2 hours as needed.  - Call if any new or changing lesions are noted between office visits  ACTINIC DAMAGE WITH PRECANCEROUS ACTINIC KERATOSES Counseling for Topical Chemotherapy Management: Patient exhibits: - Severe,  confluent actinic changes with pre-cancerous actinic keratoses that is secondary to cumulative UV radiation exposure over time - Condition that is severe; chronic, not at goal. - diffuse scaly erythematous macules and papules with underlying dyspigmentation - Discussed Prescription "Field Treatment" topical Chemotherapy for Severe, Chronic Confluent Actinic Changes with Pre-Cancerous Actinic Keratoses Field treatment involves treatment of an entire area of skin that has confluent Actinic Changes (Sun/ Ultraviolet light damage) and PreCancerous Actinic Keratoses by method of PhotoDynamic Therapy (PDT) and/or prescription Topical Chemotherapy agents such as 5-fluorouracil , 5-fluorouracil /calcipotriene, and/or imiquimod.  The purpose is to decrease the number of clinically evident and subclinical PreCancerous lesions to prevent progression to development of skin cancer by chemically destroying early precancer changes that may or may not be visible.  It has been shown to reduce the risk of developing skin cancer in the treated area. As a result of treatment, redness, scaling, crusting, and open sores may occur during treatment course. One or more than one of these methods may be used and may have to be used several times to control, suppress and eliminate the PreCancerous changes. Discussed treatment course, expected reaction, and possible side effects. - Recommend daily broad spectrum sunscreen SPF 30+ to sun-exposed areas, reapply every 2 hours as needed.  - Staying in the shade or wearing long sleeves, sun glasses (UVA+UVB protection) and wide brim hats (4-inch brim around the entire circumference of the hat) are also recommended. - Call for new or changing lesions. Start May 12, 2024 twice daily for 7 days to forehead and temples, treat chest twice daily for 7-10 days in August 2025. - Start 5-fluorouracil /calcipotriene cream twice a  day for 7 days to affected areas including forehead and temples.  Prescription sent to [facility]. Patient advised they will receive an email to purchase the medication online and have it sent to their home. Patient provided with handout reviewing treatment course and side effects and advised to call or message us  on MyChart with any concerns.  Reviewed course of treatment and expected reaction.  Patient advised to expect inflammation and crusting and advised that erosions are possible.  Patient advised to be diligent with sun protection during and after treatment. Counseled to keep medication out of reach of children and pets.    LENTIGINES, SEBORRHEIC KERATOSES, HEMANGIOMAS - Benign normal skin lesions - Benign-appearing - Call for any changes  MELANOCYTIC NEVI - Tan-brown and/or pink-flesh-colored symmetric macules and papules - Benign appearing on exam today - Observation - Call clinic for new or changing moles - Recommend daily use of broad spectrum spf 30+ sunscreen to sun-exposed areas.   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  He and his doctors who diagnosed the osteomyelitis feel that it started as a sore that he picked resulting in sepsis and subsequent osteomyelitis.  He now is more conscientious of picking at spots.  LICHEN SIMPLEX CHRONICUS/PRURIGO NODULARIS Exam: Excoriated lichenified papules and/or plaques at chest, arms. Chronic and persistent condition with duration or expected duration over one year. Condition is symptomatic/ bothersome to patient. Not currently at goal. Lichen simplex chronicus [facility]) is a persistent itchy area of thickened skin that is induced by chronic rubbing and/or scratching (chronic dermatitis).  These areas may be pink, hyperpigmented and may have excoriations and bumps (prurigo nodules- PN).  LSC/PN is commonly observed in uncontrolled atopic dermatitis and other forms of eczema, and in  other itchy skin conditions (eg, insect bites, scabies).  Sometimes it is not possible to know initial cause of LSC/PN if it has been present for a long time.  It generally responds well to treatment with high potency topical steroids.  It is important to stop rubbing/scratching the area in order to break the itch-scratch-rash-itch cycle, in order for the rash to resolve.  Treatment Plan: Avoid picking/rubbing/scratching  Mupirocin  to scabs or open sores.   Purpura - Chronic; persistent and recurrent.  Treatable, but not curable. 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 They are chronic with expected duration over 1 year. A portion of actinic keratoses will progress to squamous cell carcinoma of the skin. It is not possible to reliably predict which spots will progress to skin cancer and so treatment is recommended to prevent development of skin cancer.  Recommend daily broad spectrum sunscreen SPF 30+ to sun-exposed areas, reapply every 2 hours as needed.  Recommend staying in the shade or wearing long sleeves, sun glasses (UVA+UVB protection) and wide brim hats (4-inch brim around the entire circumference of the hat). Call for new or changing lesions. Destruction of lesion - forehead, temples, R ear x17 (17) Complexity: simple  Destruction method: cryotherapy   Informed consent: discussed and consent obtained   Timeout:  patient name, date of birth, surgical site, and procedure verified Lesion destroyed using liquid nitrogen: Yes   Region  frozen until ice ball extended beyond lesion: Yes   Outcome: patient tolerated procedure well with no complications   Post-procedure details: wound care instructions given   Additional details:  Prior to procedure, discussed risks of blister formation, small wound, skin dyspigmentation, or rare scar following cryotherapy. Recommend Vaseline ointment to treated areas while healing.  Return in about 6 months (around 09/28/2024) for TBSE, HxBCCs, AKs.  I, Darcie Easterly, CMA, am acting as scribe for Celine Collard, MD.   Documentation: I have reviewed the above documentation for accuracy and completeness, and I agree with the above.  Celine Collard, MD

## 2024-04-16 ENCOUNTER — Other Ambulatory Visit: Payer: Self-pay | Admitting: Family Medicine

## 2024-04-16 DIAGNOSIS — I10 Essential (primary) hypertension: Secondary | ICD-10-CM

## 2024-04-24 ENCOUNTER — Other Ambulatory Visit: Payer: Self-pay | Admitting: Family Medicine

## 2024-05-15 DIAGNOSIS — H401131 Primary open-angle glaucoma, bilateral, mild stage: Secondary | ICD-10-CM | POA: Diagnosis not present

## 2024-05-15 DIAGNOSIS — H524 Presbyopia: Secondary | ICD-10-CM | POA: Diagnosis not present

## 2024-05-15 DIAGNOSIS — H35033 Hypertensive retinopathy, bilateral: Secondary | ICD-10-CM | POA: Diagnosis not present

## 2024-05-15 DIAGNOSIS — I10 Essential (primary) hypertension: Secondary | ICD-10-CM | POA: Diagnosis not present

## 2024-05-30 ENCOUNTER — Other Ambulatory Visit: Payer: Self-pay | Admitting: Family Medicine

## 2024-05-30 DIAGNOSIS — M1990 Unspecified osteoarthritis, unspecified site: Secondary | ICD-10-CM

## 2024-06-05 ENCOUNTER — Other Ambulatory Visit: Payer: Self-pay

## 2024-06-05 DIAGNOSIS — N138 Other obstructive and reflux uropathy: Secondary | ICD-10-CM

## 2024-06-05 DIAGNOSIS — N401 Enlarged prostate with lower urinary tract symptoms: Secondary | ICD-10-CM | POA: Diagnosis not present

## 2024-06-06 LAB — PSA: Prostate Specific Ag, Serum: <0.1 ng/mL (ref 0.0–4.0)

## 2024-06-11 ENCOUNTER — Other Ambulatory Visit: Payer: Self-pay | Admitting: Urology

## 2024-06-11 DIAGNOSIS — N200 Calculus of kidney: Secondary | ICD-10-CM

## 2024-06-11 NOTE — Progress Notes (Unsigned)
 06/12/2024 4:52 PM   Brett Bender May 05, 1946 [id number]  Referring provider: Gasper Nancyann BRAVO, MD [address] [place],  [place] [address]  Urological history: 1. 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 Nephrolithiasis -Stone composition was calcium  oxalate dihydrate 30%, calcium  oxalate monohydrate 60% and calcium  phosphate 10% -[facility] results noted he was at increased risk for stone formation due to borderline hyperoxaluria, hypocitraturia, high urine pH and mild hyperurlcosuria.  He was given instruction on dietary changes and encouraged to drink 2.5 L of water daily -URS 2017 -CTU 2020 stones throughout the right kidney measuring up to 5 mm within the superior pole and 4 mm within the inferior pole. No ureterolithiasis. No hydronephrosis -KUB 2022 6 stones visible in the right kidney with largest 6 mm in size and 2 faint calcifications in the left  -CT renal stone study (02/2023) - Right greater than left nonobstructing nephrolithiasis. Stone burden within the right kidney has progressed slightly since prior examination. No urolithiasis. No hydronephrosis.   3. BPH with LU TS -PSA (05/2024) < 0.1 -cysto 2020 enlarged prostate bilobar coaptation, 4 cm prosthetic length -tamsulosin  0.4 mg daily and finasteride  5 mg daily  No chief complaint on file.  HPI: Brett Bender is a 78 y.o. male who presents today for yearly visit.   Previous records reviewed.    UA ***  PSA (05/2024) <0.1  Hemoglobin A1c (03/2024) 5.9  Serum creatinine (03/2024) 1.04, eGFR 74   PMH: Past Medical History:  Diagnosis Date   Actinic keratosis    Arthritis    Basal cell carcinoma 11/09/2013   R lateral edge of clavicle   Basal cell carcinoma 02/03/2016   L lateral supraclavicular base of neck/excision   Basal cell carcinoma 11/29/2018   L upper back   Basal cell carcinoma 09/11/2020   R mid back    Basal cell  carcinoma 03/17/2023   right inferior post auricular, excised 04/19/23   Basal cell carcinoma 09/15/2023   Left Superior Posterior Shoulder, EDC   Cancer (HCC)    Basal cell carcinoma bilateral arms and left shoulder   Complication of anesthesia    slow to wake up with shoulder surgery   Depression    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/03/2023   Enlarged prostate    Epidural abscess 03/02/2023   H/O elbow surgery    left   Herpes simplex    History of kidney stones    History of MRSA infection 2004   skin around eye   Hypertension    Kidney stone    Lumbar stenosis    Migraine headache    Motion sickness    ocean boats   MSSA bacteremia 02/09/2023   Overdose opiate, accidental or unintentional, initial encounter (HCC) 02/08/2023   PONV (postoperative nausea and vomiting)    Tendonitis of ankle, left     Surgical History: Past Surgical History:  Procedure Laterality Date   APPENDECTOMY     back injection     BACK SURGERY     CYSTOSCOPY/URETEROSCOPY/HOLMIUM LASER/STENT PLACEMENT Left 12/18/2015   Procedure: CYSTOSCOPY/URETEROSCOPY/HOLMIUM LASER LITHOTRIPSY;  Surgeon: Rosina Riis, MD;  Location: [facility];  Service: Urology;  Laterality: Left;   ELBOW SURGERY     IR LUMBAR DISC ASPIRATION W/IMG GUIDE  03/03/2023   LOWER BACK SURGERY  2010   L4-5 fusion   LUMBAR LAMINECTOMY/DECOMPRESSION MICRODISCECTOMY N/A 08/21/2018   Procedure: LUMBAR LAMINECTOMY/DECOMPRESSION MICRODISCECTOMY 1 LEVEL-L3/4;  Surgeon: Bluford Standing, MD;  Location: [facility];  Service: Neurosurgery;  Laterality: N/A;   NECK SURGERY     Upper neck surgery; with wires placed   SHOULDER SURGERY Right 11/10/2011   arthroscopic surgery . Dr. Claudene, [facility]   TEE WITHOUT CARDIOVERSION N/A 02/11/2023   Procedure: TRANSESOPHAGEAL ECHOCARDIOGRAM;  Surgeon: Darliss Rogue, MD;  Location: [facility] OR[facility];  Service: Cardiovascular;  Laterality: N/A;   TONSILLECTOMY     TRICEPS TENDON REPAIR Left 12/04/2019   Procedure: TRICEPS  TENDON REPAIR, OLECRANON BURSA;  Surgeon: Brett Priest, MD;  Location: [facility];  Service: Orthopedics;  Laterality: Left;    Home Medications:  Allergies as of 06/12/2024       Reactions   Penicillins Other (See Comments)   Stomach Ache        Medication List        Accurate as of June 11, 2024  4:52 PM. If you have any questions, ask your nurse or doctor.          amLODipine  10 MG tablet Commonly known as: NORVASC  TAKE ONE TABLET BY MOUTH ONCE A DAY   aspirin  81 MG chewable tablet Chew by mouth daily.   atenolol -chlorthalidone  50-25 MG tablet Commonly known as: TENORETIC  TAKE ONE HALF (1/2) TABLET BY MOUTH ONCE DAILY   diclofenac  50 MG EC tablet Commonly known as: VOLTAREN  TAKE ONE TABLET BY MOUTH TWICE DAILY AS NEEDED   diclofenac  Sodium 1 % Gel Commonly known as: VOLTAREN  APPLY FOUR GRAMS TOPICALLY FIVE TIMES DAILY   DIGESTIVE ADVANTAGE PO Take 1 tablet by mouth daily.   finasteride  5 MG tablet Commonly known as: PROSCAR  Take 1 tablet (5 mg total) by mouth daily.   fluorouracil  5 % cream Commonly known as: EFUDEX  Apply twice daily for 7 days to forehead and temples, treat chest twice daily for 7-10 days in August 2025.   ketoconazole  2 % shampoo Commonly known as: NIZORAL  3 times weekly lather on scalp, leave on 5-8 minutes, rinse well.   latanoprost  0.005 % ophthalmic solution Commonly known as: XALATAN  Place 1 drop into both eyes at bedtime.   mirtazapine  30 MG tablet Commonly known as: REMERON  TAKE ONE TABLET BY MOUTH EVERY NIGHT AT BEDTIME   multivitamin tablet Take 1 tablet by mouth daily.   mupirocin  ointment 2 % Commonly known as: BACTROBAN  Apply once daily with bandage change   psyllium 58.6 % powder Commonly known as: METAMUCIL Take 1 packet by mouth daily as needed (regularity).   rosuvastatin  20 MG tablet Commonly known as: CRESTOR  TAKE ONE TABLET (20 MG TOTAL) BY MOUTH DAILY.   tamsulosin  0.4 MG Caps  capsule Commonly known as: FLOMAX  Take 1 capsule (0.4 mg total) by mouth daily.   timolol  0.5 % ophthalmic solution Commonly known as: TIMOPTIC  Place 1 drop into both eyes daily.   traMADol  50 MG tablet Commonly known as: ULTRAM  TAKE ONE TABLET (50 MG TOTAL) BY MOUTH EVERY EIGHT (EIGHT) HOURS AS NEEDED. DO NOT TAKE WITH GABAPENTIN  OR TIZANIDINE         Allergies:  Allergies  Allergen Reactions   Penicillins Other (See Comments)    Stomach Ache    Family History: Family History  Problem Relation Age of Onset   Depression Son    Diabetes Neg Hx    Cancer Neg Hx    Heart attack Neg Hx    Kidney disease Neg Hx    Prostate cancer Neg Hx    Kidney cancer Neg Hx    Bladder  Cancer Neg Hx     Social History:  reports that he has never smoked. He has never used smokeless tobacco. He reports that he does not drink alcohol and does not use drugs.  ROS: Pertinent ROS in HPI  Physical Exam: There were no vitals taken for this visit.  Constitutional:  Well nourished. Alert and oriented, No acute distress. HEENT: [place] AT, moist mucus membranes.  Trachea midline, no masses. Cardiovascular: No clubbing, cyanosis, or edema. Respiratory: Normal respiratory effort, no increased work of breathing. GI: Abdomen is soft, non tender, non distended, no abdominal masses. Liver and spleen not palpable.  No hernias appreciated.  Stool sample for occult testing is not indicated.   GU: No CVA tenderness.  No bladder fullness or masses.  Patient with circumcised/uncircumcised phallus. ***Foreskin easily retracted***  Urethral meatus is patent.  No penile discharge. No penile lesions or rashes. Scrotum without lesions, cysts, rashes and/or edema.  Testicles are located scrotally bilaterally. No masses are appreciated in the testicles. Left and right epididymis are normal. Rectal: Patient with  normal sphincter tone. Anus and perineum without scarring or rashes. No rectal masses are appreciated. Prostate  is approximately *** grams, *** nodules are appreciated. Seminal vesicles are normal. Skin: No rashes, bruises or suspicious lesions. Lymph: No cervical or inguinal adenopathy. Neurologic: Grossly intact, no focal deficits, moving all 4 extremities. Psychiatric: Normal mood and affect.   Laboratory Data: See EPIC and HPI I have reviewed the labs.  Pertinent Imaging: KUB ***  I have independently reviewed the films.  Radiology interpretation still pending.    Assessment & Plan:    1. Nephrolithiasis -KUB *** -asymptomatic -continue to monitor  2. BPH with LUTS -PSA stable  -continue conservative management, avoiding bladder irritants and timed voiding's -Continue finasteride  5 mg daily  3. High risk hematuria -non -smoker -work up in 2020 - NED  4.  Left adrenal adenoma -Explained that it is likely not a cancerous adenoma but it cannot be metabolically active and so I will place referral for endocrinology to evaluate for this    No follow-ups on file.  These notes generated with voice recognition software. I apologize for typographical errors.  CLOTILDA HELON RIGGERS  [facility] [address]  [address] [place], [place] [address] [phone]

## 2024-06-12 ENCOUNTER — Encounter: Payer: Self-pay | Admitting: Urology

## 2024-06-12 ENCOUNTER — Ambulatory Visit
Admission: RE | Admit: 2024-06-12 | Discharge: 2024-06-12 | Disposition: A | Discharge status: Home / Self Care | Attending: Urology | Admitting: Urology

## 2024-06-12 ENCOUNTER — Ambulatory Visit
Admission: RE | Admit: 2024-06-12 | Discharge: 2024-06-12 | Disposition: A | Discharge status: Home / Self Care | Source: Ambulatory Visit | Attending: Urology

## 2024-06-12 ENCOUNTER — Ambulatory Visit (INDEPENDENT_AMBULATORY_CARE_PROVIDER_SITE_OTHER): Payer: Self-pay | Admitting: Urology

## 2024-06-12 VITALS — BP 133/81 | HR 56 | Ht 76.0 in | Wt 215.0 lb

## 2024-06-12 DIAGNOSIS — N401 Enlarged prostate with lower urinary tract symptoms: Secondary | ICD-10-CM

## 2024-06-12 DIAGNOSIS — R109 Unspecified abdominal pain: Secondary | ICD-10-CM | POA: Diagnosis not present

## 2024-06-12 DIAGNOSIS — N138 Other obstructive and reflux uropathy: Secondary | ICD-10-CM | POA: Diagnosis not present

## 2024-06-12 DIAGNOSIS — R3129 Other microscopic hematuria: Secondary | ICD-10-CM | POA: Diagnosis not present

## 2024-06-12 DIAGNOSIS — N200 Calculus of kidney: Secondary | ICD-10-CM | POA: Insufficient documentation

## 2024-06-12 DIAGNOSIS — R319 Hematuria, unspecified: Secondary | ICD-10-CM

## 2024-06-12 DIAGNOSIS — D3502 Benign neoplasm of left adrenal gland: Secondary | ICD-10-CM

## 2024-06-12 LAB — URINALYSIS, COMPLETE
Bilirubin, UA: NEGATIVE
Glucose, UA: NEGATIVE
Ketones, UA: NEGATIVE
Leukocytes,UA: NEGATIVE
Nitrite, UA: NEGATIVE
Protein,UA: NEGATIVE
Specific Gravity, UA: 1.015 (ref 1.005–1.030)
Urobilinogen, Ur: 0.2 mg/dL (ref 0.2–1.0)
pH, UA: 7 (ref 5.0–7.5)

## 2024-06-12 LAB — MICROSCOPIC EXAMINATION

## 2024-06-12 NOTE — Patient Instructions (Addendum)
 Scheduling number:  [phone]  Cystoscopy Cystoscopy is a procedure that is used to help diagnose and sometimes treat conditions that affect the lower urinary tract. The lower urinary tract includes the bladder and the urethra. The urethra is the tube that drains urine from the bladder. Cystoscopy is done using a thin, tube-shaped instrument with a light and camera at the end (cystoscope). The cystoscope may be hard or flexible, depending on the goal of the procedure. The cystoscope is inserted through the urethra, into the bladder. Cystoscopy may be recommended if you have: Urinary tract infections that keep coming back. Blood in the urine (hematuria). An inability to control when you urinate (urinary incontinence) or an overactive bladder. Unusual cells found in a urine sample. A blockage in the urethra, such as a urinary stone. Painful urination. An abnormality in the bladder found during an intravenous pyelogram (IVP) or CT scan. What are the risks? Generally, this is a safe procedure. However, problems may occur, including: Infection. Bleeding.  What happens during the procedure?  You will be given one or more of the following: A medicine to numb the area (local anesthetic). The area around the opening of your urethra will be cleaned. The cystoscope will be passed through your urethra into your bladder. Germ-free (sterile) fluid will flow through the cystoscope to fill your bladder. The fluid will stretch your bladder so that your health care provider can clearly examine your bladder walls. Your doctor will look at the urethra and bladder. The cystoscope will be removed The procedure may vary among health care providers  What can I expect after the procedure? After the procedure, it is common to have: Some soreness or pain in your urethra. Urinary symptoms. These include: Mild pain or burning when you urinate. Pain should stop within a few minutes after you urinate. This may  last for up to a few days after the procedure. A small amount of blood in your urine for several days. Feeling like you need to urinate but producing only a small amount of urine. Follow these instructions at home: General instructions Return to your normal activities as told by your health care provider.  Drink plenty of fluids after the procedure. Keep all follow-up visits as told by your health care provider. This is important. Contact a health care provider if you: Have pain that gets worse or does not get better with medicine, especially pain when you urinate lasting longer than 72 hours after the procedure. Have trouble urinating. Get help right away if you: Have blood clots in your urine. Have a fever or chills. Are unable to urinate. Summary Cystoscopy is a procedure that is used to help diagnose and sometimes treat conditions that affect the lower urinary tract. Cystoscopy is done using a thin, tube-shaped instrument with a light and camera at the end. After the procedure, it is common to have some soreness or pain in your urethra. It is normal to have blood in your urine after the procedure.  If you were prescribed an antibiotic medicine, take it as told by your health care provider.  This information is not intended to replace advice given to you by your health care provider. Make sure you discuss any questions you have with your health care provider. Document Revised: 10/17/2018 Document Reviewed: 10/17/2018 Elsevier Patient Education  2020 [employer].

## 2024-06-13 DIAGNOSIS — M19012 Primary osteoarthritis, left shoulder: Secondary | ICD-10-CM | POA: Diagnosis not present

## 2024-06-13 DIAGNOSIS — M1712 Unilateral primary osteoarthritis, left knee: Secondary | ICD-10-CM | POA: Diagnosis not present

## 2024-06-28 ENCOUNTER — Ambulatory Visit
Admission: RE | Admit: 2024-06-28 | Discharge: 2024-06-28 | Disposition: A | Discharge status: Home / Self Care | Source: Ambulatory Visit | Attending: Urology | Admitting: Urology

## 2024-06-28 DIAGNOSIS — R3129 Other microscopic hematuria: Secondary | ICD-10-CM | POA: Diagnosis not present

## 2024-06-28 DIAGNOSIS — N200 Calculus of kidney: Secondary | ICD-10-CM | POA: Diagnosis not present

## 2024-06-28 DIAGNOSIS — N281 Cyst of kidney, acquired: Secondary | ICD-10-CM | POA: Diagnosis not present

## 2024-06-28 DIAGNOSIS — K4090 Unilateral inguinal hernia, without obstruction or gangrene, not specified as recurrent: Secondary | ICD-10-CM | POA: Diagnosis not present

## 2024-06-28 LAB — POCT I-STAT CREATININE: Creatinine, Ser: 1 mg/dL (ref 0.61–1.24)

## 2024-06-28 MED ORDER — IOHEXOL 300 MG/ML  SOLN
100.0000 mL | Freq: Once | INTRAMUSCULAR | Status: AC | PRN
  Administered 2024-06-28: 100 mL via INTRAVENOUS

## 2024-07-06 ENCOUNTER — Ambulatory Visit (INDEPENDENT_AMBULATORY_CARE_PROVIDER_SITE_OTHER): Admitting: Urology

## 2024-07-06 VITALS — BP 132/80 | HR 64 | Wt 210.0 lb

## 2024-07-06 DIAGNOSIS — R3129 Other microscopic hematuria: Secondary | ICD-10-CM | POA: Diagnosis not present

## 2024-07-06 MED ORDER — SULFAMETHOXAZOLE-TRIMETHOPRIM 800-160 MG PO TABS
1.0000 | ORAL_TABLET | Freq: Once | ORAL | Status: AC
  Administered 2024-07-06: 1 via ORAL

## 2024-07-06 MED ORDER — LIDOCAINE HCL URETHRAL/MUCOSAL 2 % EX GEL
1.0000 | Freq: Once | CUTANEOUS | Status: AC
  Administered 2024-07-06: 1 via URETHRAL

## 2024-07-06 NOTE — Progress Notes (Signed)
 Cystoscopy Procedure Note:  Indication: Microscopic hematuria  Bactrim  given for prophylaxis  After informed consent and discussion of the procedure and its risks, Brett Bender was positioned and prepped in the standard fashion. Cystoscopy was performed with a flexible cystoscope. The urethra, bladder neck and entire bladder was visualized in a standard fashion. The prostate was moderate in size. The ureteral orifices were visualized in their normal location and orientation.  Bladder mucosa grossly normal with no suspicious lesions, no abnormalities on retroflexion  Imaging: CT with bilateral nonobstructive stones, no worrisome findings  Findings: Negative workup  Assessment and Plan: Likely does not need repeat micro heme workup with his 2 negative workups in the last 5 years Can continue yearly PA follow-up  Redell Burnet, MD 07/06/2024

## 2024-07-11 DIAGNOSIS — M1712 Unilateral primary osteoarthritis, left knee: Secondary | ICD-10-CM | POA: Diagnosis not present

## 2024-07-16 ENCOUNTER — Other Ambulatory Visit: Payer: Self-pay | Admitting: Family Medicine

## 2024-07-16 DIAGNOSIS — I10 Essential (primary) hypertension: Secondary | ICD-10-CM

## 2024-07-20 ENCOUNTER — Other Ambulatory Visit: Payer: Self-pay | Admitting: Urology

## 2024-07-20 DIAGNOSIS — N138 Other obstructive and reflux uropathy: Secondary | ICD-10-CM

## 2024-07-23 ENCOUNTER — Other Ambulatory Visit: Payer: Self-pay | Admitting: Urology

## 2024-07-23 DIAGNOSIS — N138 Other obstructive and reflux uropathy: Secondary | ICD-10-CM

## 2024-07-30 ENCOUNTER — Other Ambulatory Visit: Payer: Self-pay | Admitting: Family Medicine

## 2024-08-14 ENCOUNTER — Ambulatory Visit (INDEPENDENT_AMBULATORY_CARE_PROVIDER_SITE_OTHER)

## 2024-08-14 DIAGNOSIS — Z23 Encounter for immunization: Secondary | ICD-10-CM

## 2024-08-29 DIAGNOSIS — M1712 Unilateral primary osteoarthritis, left knee: Secondary | ICD-10-CM | POA: Diagnosis not present

## 2024-08-31 ENCOUNTER — Other Ambulatory Visit: Payer: Self-pay | Admitting: Orthopedic Surgery

## 2024-09-05 ENCOUNTER — Telehealth: Payer: Self-pay | Admitting: Family Medicine

## 2024-09-05 NOTE — Telephone Encounter (Addendum)
 Received request for surgical clearance from Dr. Lorelle for surgery scheduled 11/17. Patient needs to schedule office visit to be seen at least 10 days before surgery for pre-op. He can have one of the [facility] follow up slots.

## 2024-09-05 NOTE — Telephone Encounter (Signed)
 Called pt to schedule appt 10 days prior from surgery. No answer lvm to call back to schedule or could also schedule through pt MyChart.

## 2024-09-06 NOTE — Telephone Encounter (Signed)
 Left message on VM to come in on 09/12/24 @ 1:20 p.m.

## 2024-09-10 ENCOUNTER — Other Ambulatory Visit

## 2024-09-12 ENCOUNTER — Ambulatory Visit: Admitting: Family Medicine

## 2024-09-17 ENCOUNTER — Other Ambulatory Visit: Payer: Self-pay

## 2024-09-17 ENCOUNTER — Ambulatory Visit: Admitting: Family Medicine

## 2024-09-17 ENCOUNTER — Encounter: Payer: Self-pay | Admitting: Family Medicine

## 2024-09-17 ENCOUNTER — Encounter
Admission: RE | Admit: 2024-09-17 | Discharge: 2024-09-17 | Disposition: A | Source: Ambulatory Visit | Attending: Orthopedic Surgery

## 2024-09-17 VITALS — BP 142/84 | HR 61 | Temp 97.7°F | Ht 75.5 in | Wt 213.9 lb

## 2024-09-17 VITALS — BP 146/81 | HR 59 | Resp 16 | Ht 75.5 in | Wt 213.8 lb

## 2024-09-17 DIAGNOSIS — Z01818 Encounter for other preprocedural examination: Secondary | ICD-10-CM | POA: Insufficient documentation

## 2024-09-17 DIAGNOSIS — Z01812 Encounter for preprocedural laboratory examination: Secondary | ICD-10-CM

## 2024-09-17 DIAGNOSIS — G8929 Other chronic pain: Secondary | ICD-10-CM

## 2024-09-17 DIAGNOSIS — Z01810 Encounter for preprocedural cardiovascular examination: Secondary | ICD-10-CM | POA: Diagnosis not present

## 2024-09-17 HISTORY — DX: Unilateral primary osteoarthritis, left knee: M17.12

## 2024-09-17 LAB — URINALYSIS, ROUTINE W REFLEX MICROSCOPIC
Bilirubin Urine: NEGATIVE
Glucose, UA: NEGATIVE mg/dL
Hgb urine dipstick: NEGATIVE
Ketones, ur: NEGATIVE mg/dL
Leukocytes,Ua: NEGATIVE
Nitrite: NEGATIVE
Protein, ur: NEGATIVE mg/dL
Specific Gravity, Urine: 1.018 (ref 1.005–1.030)
pH: 7 (ref 5.0–8.0)

## 2024-09-17 LAB — COMPREHENSIVE METABOLIC PANEL WITH GFR
ALT: 22 U/L (ref 0–44)
AST: 24 U/L (ref 15–41)
Albumin: 3.9 g/dL (ref 3.5–5.0)
Alkaline Phosphatase: 70 U/L (ref 38–126)
Anion gap: 13 (ref 5–15)
BUN: 27 mg/dL — ABNORMAL HIGH (ref 8–23)
CO2: 26 mmol/L (ref 22–32)
Calcium: 9.3 mg/dL (ref 8.9–10.3)
Chloride: 101 mmol/L (ref 98–111)
Creatinine, Ser: 1.04 mg/dL (ref 0.61–1.24)
GFR, Estimated: >60 mL/min (ref >60)
Glucose, Bld: 101 mg/dL — ABNORMAL HIGH (ref 70–99)
Potassium: 3.9 mmol/L (ref 3.5–5.1)
Sodium: 140 mmol/L (ref 135–145)
Total Bilirubin: 0.9 mg/dL (ref 0.0–1.2)
Total Protein: 7.7 g/dL (ref 6.5–8.1)

## 2024-09-17 LAB — CBC WITH DIFFERENTIAL/PLATELET
Abs Immature Granulocytes: 0.03 K/uL (ref 0.00–0.07)
Basophils Absolute: 0.1 K/uL (ref 0.0–0.1)
Basophils Relative: 1 %
Eosinophils Absolute: 0.2 K/uL (ref 0.0–0.5)
Eosinophils Relative: 2 %
HCT: 48.1 % (ref 39.0–52.0)
Hemoglobin: 15.8 g/dL (ref 13.0–17.0)
Immature Granulocytes: 0 %
Lymphocytes Relative: 42 %
Lymphs Abs: 4.1 K/uL — ABNORMAL HIGH (ref 0.7–4.0)
MCH: 29.6 pg (ref 26.0–34.0)
MCHC: 32.8 g/dL (ref 30.0–36.0)
MCV: 90.1 fL (ref 80.0–100.0)
Monocytes Absolute: 0.9 K/uL (ref 0.1–1.0)
Monocytes Relative: 9 %
Neutro Abs: 4.6 K/uL (ref 1.7–7.7)
Neutrophils Relative %: 46 %
Platelets: 255 K/uL (ref 150–400)
RBC: 5.34 MIL/uL (ref 4.22–5.81)
RDW: 13.7 % (ref 11.5–15.5)
WBC: 9.9 K/uL (ref 4.0–10.5)
nRBC: 0 % (ref 0.0–0.2)

## 2024-09-17 LAB — SURGICAL PCR SCREEN
MRSA, PCR: NEGATIVE
Staphylococcus aureus: POSITIVE — AB

## 2024-09-17 MED ORDER — SILVER SULFADIAZINE 1 % EX CREA: 1.0000 | TOPICAL_CREAM | Freq: Every day | CUTANEOUS | 0 refills | Status: AC

## 2024-09-17 NOTE — Progress Notes (Signed)
 Established patient visit   Patient: Brett Bender   DOB: 1946-06-05   78 y.o. Male  MRN: [id number] Visit Date: 09/17/2024  Today's healthcare provider: Nancyann Perry, MD   Chief Complaint  Patient presents with   Pre-op Exam    Left knee, Total knee replacement   Subjective    Discussed the use of AI scribe software for clinical note transcription with the patient, who gave verbal consent to proceed.  History of Present Illness   Brett Bender is a 78 year old male who presents for preoperative evaluation for left knee surgery.  He has a history of a severe bacterial infection in early 2024, which affected his spine. An MRI was performed at that time, and the patient was told by the doctor that surgery was not needed. He continues to experience back pain.  He has been experiencing worsening pain in his left knee, which he attributes to an old injury from high school. He received injections, but they provided minimal relief. He was told that his x-rays showed 'bone on bone' changes, and he is scheduled for knee surgery next Monday.  He underwent preoperative testing this morning, including an EKG, blood work, and urine tests. No breathing difficulties, chest pain, heart palpitations, or swelling in his ankles or feet.  He is experiencing a skin issue that has been present for weeks and is not healing well. He has been using a prescribed cream but finds it difficult to keep a Band-Aid in place over the affected area. He is not allergic to any antibiotics and has used Silvadene cream in the past.     Hospital Outpatient Visit on 09/17/2024  Component Date Value Ref Range Status   MRSA, PCR 09/17/2024 NEGATIVE  NEGATIVE Final   Staphylococcus aureus 09/17/2024 POSITIVE (A)  NEGATIVE Final   WBC 09/17/2024 9.9  4.0 - 10.5 K/uL Final   RBC 09/17/2024 5.34  4.22 - 5.81 MIL/uL Final   Hemoglobin 09/17/2024 15.8  13.0 - 17.0 g/dL Final   HCT 88/89/7974 48.1  39.0 - 52.0 % Final    MCV 09/17/2024 90.1  80.0 - 100.0 fL Final   MCH 09/17/2024 29.6  26.0 - 34.0 pg Final   MCHC 09/17/2024 32.8  30.0 - 36.0 g/dL Final   RDW 88/89/7974 13.7  11.5 - 15.5 % Final   Platelets 09/17/2024 255  150 - 400 K/uL Final   nRBC 09/17/2024 0.0  0.0 - 0.2 % Final   Neutrophils Relative % 09/17/2024 46  % Final   Neutro Abs 09/17/2024 4.6  1.7 - 7.7 K/uL Final   Lymphocytes Relative 09/17/2024 42  % Final   Lymphs Abs 09/17/2024 4.1 (H)  0.7 - 4.0 K/uL Final   Monocytes Relative 09/17/2024 9  % Final   Monocytes Absolute 09/17/2024 0.9  0.1 - 1.0 K/uL Final   Eosinophils Relative 09/17/2024 2  % Final   Eosinophils Absolute 09/17/2024 0.2  0.0 - 0.5 K/uL Final   Basophils Relative 09/17/2024 1  % Final   Basophils Absolute 09/17/2024 0.1  0.0 - 0.1 K/uL Final   Immature Granulocytes 09/17/2024 0  % Final   Abs Immature Granulocytes 09/17/2024 0.03  0.00 - 0.07 K/uL Final   Performed at [facility], [address] [place], [place] [address]   Sodium 09/17/2024 140  135 - 145 mmol/L Final   Potassium 09/17/2024 3.9  3.5 - 5.1 mmol/L Final   Chloride 09/17/2024 101  98 -  111 mmol/L Final   CO2 09/17/2024 26  22 - 32 mmol/L Final   Glucose, Bld 09/17/2024 101 (H)  70 - 99 mg/dL Final   Glucose reference range applies only to samples taken after fasting for at least 8 hours.   BUN 09/17/2024 27 (H)  8 - 23 mg/dL Final   Creatinine, Ser 09/17/2024 1.04  0.61 - 1.24 mg/dL Final   Calcium  09/17/2024 9.3  8.9 - 10.3 mg/dL Final   Total Protein 88/89/7974 7.7  6.5 - 8.1 g/dL Final   Albumin 88/89/7974 3.9  3.5 - 5.0 g/dL Final   AST 88/89/7974 24  15 - 41 U/L Final   ALT 09/17/2024 22  0 - 44 U/L Final   Alkaline Phosphatase 09/17/2024 70  38 - 126 U/L Final   Total Bilirubin 09/17/2024 0.9  0.0 - 1.2 mg/dL Final   GFR, Estimated 09/17/2024 >60  >60 mL/min Final   Comment: (NOTE) Calculated using the CKD-EPI Creatinine Equation (2021)    Anion gap 09/17/2024 13  5 - 15  Final   Performed at [facility], [address] [place], [place] [address]   Color, Urine 09/17/2024 YELLOW (A)  YELLOW Final   APPearance 09/17/2024 HAZY (A)  CLEAR Final   Specific Gravity, Urine 09/17/2024 1.018  1.005 - 1.030 Final   pH 09/17/2024 7.0  5.0 - 8.0 Final   Glucose, UA 09/17/2024 NEGATIVE  NEGATIVE mg/dL Final   Hgb urine dipstick 09/17/2024 NEGATIVE  NEGATIVE Final   Bilirubin Urine 09/17/2024 NEGATIVE  NEGATIVE Final   Ketones, ur 09/17/2024 NEGATIVE  NEGATIVE mg/dL Final   Protein, ur 88/89/7974 NEGATIVE  NEGATIVE mg/dL Final   Nitrite 88/89/7974 NEGATIVE  NEGATIVE Final   Leukocytes,Ua 09/17/2024 NEGATIVE  NEGATIVE Final   Performed at [facility], [address] [place], [place] [address]     Medications: Outpatient Medications Prior to Visit  Medication Sig   amLODipine  (NORVASC ) 10 MG tablet TAKE ONE TABLET BY MOUTH ONCE A DAY   aspirin  81 MG chewable tablet Chew by mouth daily.   atenolol -chlorthalidone  (TENORETIC ) 50-25 MG tablet TAKE ONE HALF (1/2) TABLET BY MOUTH ONCE DAILY   Bacillus Coagulans-Inulin (METAMUCIL FIBER + PROBIOTICS) CHEW Chew 1 Piece by mouth in the morning, at noon, and at bedtime.   diclofenac  (VOLTAREN ) 50 MG EC tablet TAKE ONE TABLET BY MOUTH TWICE DAILY AS NEEDED   diclofenac  Sodium (VOLTAREN ) 1 % GEL APPLY FOUR GRAMS TOPICALLY FIVE TIMES DAILY   finasteride  (PROSCAR ) 5 MG tablet TAKE ONE TABLET (5 MG TOTAL) BY MOUTH DAILY.   ketoconazole  (NIZORAL ) 2 % shampoo 3 times weekly lather on scalp, leave on 5-8 minutes, rinse well.   latanoprost  (XALATAN ) 0.005 % ophthalmic solution Place 1 drop into both eyes at bedtime.    mirtazapine  (REMERON ) 30 MG tablet TAKE ONE TABLET BY MOUTH EVERY NIGHT AT BEDTIME   Multiple Vitamin (MULTIVITAMIN) tablet Take 1 tablet by mouth daily.   rosuvastatin  (CRESTOR ) 20 MG tablet TAKE ONE TABLET (20 MG TOTAL) BY MOUTH DAILY.   tamsulosin  (FLOMAX ) 0.4 MG CAPS capsule TAKE ONE CAPSULE  (0.4 MG TOTAL) BY MOUTH DAILY.   timolol  (TIMOPTIC ) 0.5 % ophthalmic solution Place 1 drop into both eyes daily.    No facility-administered medications prior to visit.   Review of Systems  Constitutional:  Negative for appetite change, chills and fever.  Respiratory:  Negative for chest tightness, shortness of breath and wheezing.   Cardiovascular:  Negative for chest pain and palpitations.  Gastrointestinal:  Negative for abdominal pain, nausea and vomiting.       Objective    BP (!) 142/84 (BP Location: Left Arm, Patient Position: Sitting)   Pulse 61   Temp 97.7 F (36.5 C) (Oral)   Ht 6' 3.5 (1.918 m)   Wt 213 lb 14.4 oz (97 kg)   SpO2 96%   BMI 26.38 kg/m   Physical Exam   General: Appearance:    Well developed, well nourished male in no acute distress  Eyes:    PERRL, conjunctiva/corneas clear, EOM's intact       Lungs:     Clear to auscultation bilaterally, respirations unlabored  Heart:    Normal heart rate. Normal rhythm. No murmurs, rubs, or gallops.    Skin:   About 1cm abrasion left side of neck.   MS:   All extremities are intact.    Neurologic:   Awake, alert, oriented x 3. No apparent focal neurological defect.         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 Previous conservative treatments failed. Surgery planned.  Patient has no absolute or relative contraindications to planned surgery and anesthesia including general anesthesia. He is at low risk for cardiac and pulmonary complications. Is optimized for surgery. Pre-op clearance forms completed and faxed to surgeon's office.  - Continue using incentive spirometer pre-surgery. - Ensure post-operative rehabilitation is planned and understood.  Chronic non-healing skin wound Chronic wound unresponsive to previous treatment. Difficulty maintaining Band-Aid coverage. - Prescribed Silvadene cream once daily. - Advised to keep the wound covered  with a Band-Aid as much as possible. Contact his dermatologist if not rapidly improving with aboe.         Nancyann Perry, MD  [facility] [phone] (phone) [phone] (fax)  [facility]

## 2024-09-17 NOTE — Patient Instructions (Addendum)
 Your procedure is scheduled on: 09/24/24 - Monday Report to the Registration Desk on the 1st floor of the [facility]. To find out your arrival time, please call [phone] between 1PM - 3PM on: 09/21/24 - Friday If your arrival time is 6:00 am, do not arrive before that time as the [facility] entrance doors do not open until 6:00 am.  REMEMBER: Instructions that are not followed completely may result in serious medical risk, up to and including death; or upon the discretion of your surgeon and anesthesiologist your surgery may need to be rescheduled.  Do not eat food after midnight the night before surgery.  No gum chewing or hard candies.  You may however, drink CLEAR liquids up to 2 hours before you are scheduled to arrive for your surgery. Do not drink anything within 2 hours of your scheduled arrival time.  Clear liquids include: - water  - apple juice without pulp - gatorade (not RED colors) - black coffee or tea (Do NOT add milk or creamers to the coffee or tea) Do NOT drink anything that is not on this list.  In addition, your doctor has ordered for you to drink the provided:  Ensure Pre-Surgery Clear Carbohydrate Drink  Drinking this carbohydrate drink up to two hours before surgery helps to reduce insulin resistance and improve patient outcomes. Please complete drinking 2 hours before scheduled arrival time.  One week prior to surgery: Stop Anti-inflammatories (NSAIDS) such as diclofenac  (VOLTAREN ) , Advil , Aleve, Ibuprofen , Motrin , Naproxen, Naprosyn and Aspirin  based products such as Excedrin, Goody's Powder, BC Powder.  Stop ANY OVER THE COUNTER supplements until after surgery : Multiple Vitamin   You may however, continue to take Tylenol  if needed for pain up until the day of surgery.  ON THE DAY OF SURGERY ONLY TAKE THESE MEDICATIONS WITH SIPS OF WATER:  amLODipine  (NORVASC )  finasteride  (PROSCAR )  tamsulosin  (FLOMAX )    No Alcohol for 24 hours before or  after surgery.  No Smoking including e-cigarettes for 24 hours before surgery.  No chewable tobacco products for at least 6 hours before surgery.  No nicotine patches on the day of surgery.  Do not use any recreational drugs for at least a week (preferably 2 weeks) before your surgery.  Please be advised that the combination of cocaine and anesthesia may have negative outcomes, up to and including death. If you test positive for cocaine, your surgery will be cancelled.  On the morning of surgery brush your teeth with toothpaste and water, you may rinse your mouth with mouthwash if you wish. Do not swallow any toothpaste or mouthwash.  Use CHG Soap or wipes as directed on instruction sheet.  Do not wear jewelry, make-up, hairpins, clips or nail polish.  For welded (permanent) jewelry: bracelets, anklets, waist bands, etc.  Please have this removed prior to surgery.  If it is not removed, there is a chance that hospital personnel will need to cut it off on the day of surgery.  Do not wear lotions, powders, or perfumes.   Do not shave body hair from the neck down 48 hours before surgery.  Contact lenses, hearing aids and dentures may not be worn into surgery.  Do not bring valuables to the hospital. [facility] is not responsible for any missing/lost belongings or valuables.   Notify your doctor if there is any change in your medical condition (cold, fever, infection).  Wear comfortable clothing (specific to your surgery type) to the hospital.  After surgery,  you can help prevent lung complications by doing breathing exercises.  Take deep breaths and cough every 1-2 hours. Your doctor may order a device called an Incentive Spirometer to help you take deep breaths.  If you are being admitted to the hospital overnight, leave your suitcase in the car. After surgery it may be brought to your room.  In case of increased patient census, it may be necessary for you, the patient, to  continue your postoperative care in the Same Day Surgery department.  If you are being discharged the day of surgery, you will not be allowed to drive home. You will need a responsible individual to drive you home and stay with you for 24 hours after surgery.   If you are taking public transportation, you will need to have a responsible individual with you.  Please call the Pre-admissions Testing Dept. at [phone] if you have any questions about these instructions.  Surgery Visitation Policy:  Patients having surgery or a procedure may have two visitors.  Children under the age of 78 must have an adult with them who is not the patient.  Inpatient Visitation:    Visiting hours are 7 a.m. to 8 p.m. Up to four visitors are allowed at one time in a patient room. The visitors may rotate out with other people during the day.  One visitor age 55 or older may stay with the patient overnight and must be in the room by 8 p.m.   Merchandiser, Retail to address health-related social needs:  [email or web address]    Pre-operative 4 CHG Bath Instructions   You can play a key role in reducing the risk of infection after surgery. Your skin needs to be as free of germs as possible. You can reduce the number of germs on your skin by washing with CHG (chlorhexidine  gluconate) soap before surgery. CHG is an antiseptic soap that kills germs and continues to kill germs even after washing.   DO NOT use if you have an allergy to chlorhexidine /CHG or antibacterial soaps. If your skin becomes reddened or irritated, stop using the CHG and notify one of our RNs at [phone].   Please shower with the CHG soap starting 4 days before surgery using the following schedule: 11/13 - 11/16.    Please keep in mind the following:  DO NOT shave, including legs and underarms, starting the day of your first shower.   You may shave your face at any point before/day of surgery.  Place clean  sheets on your bed the day you start using CHG soap. Use a clean washcloth (not used since being washed) for each shower. DO NOT sleep with pets once you start using the CHG.   CHG Shower Instructions:  If you choose to wash your hair and private area, wash first with your normal shampoo/soap.  After you use shampoo/soap, rinse your hair and body thoroughly to remove shampoo/soap residue.  Turn the water OFF and apply about 3 tablespoons (45 ml) of CHG soap to a CLEAN washcloth.  Apply CHG soap ONLY FROM YOUR NECK DOWN TO YOUR TOES (washing for 3-5 minutes)  DO NOT use CHG soap on face, private areas, open wounds, or sores.  Pay special attention to the area where your surgery is being performed.  If you are having back surgery, having someone wash your back for you may be helpful. Wait 2 minutes after CHG soap is applied, then you may rinse off the CHG soap.  Pat dry with  a clean towel  Put on clean clothes/pajamas   If you choose to wear lotion, please use ONLY the CHG-compatible lotions on the back of this paper.     Additional instructions for the day of surgery: DO NOT APPLY any lotions, deodorants, cologne, or perfumes.   Put on clean/comfortable clothes.  Brush your teeth.  Ask your nurse before applying any prescription medications to the skin.      CHG Compatible Lotions   Aveeno Moisturizing lotion  Cetaphil Moisturizing Cream  Cetaphil Moisturizing Lotion  Clairol Herbal Essence Moisturizing Lotion, Dry Skin  Clairol Herbal Essence Moisturizing Lotion, Extra Dry Skin  Clairol Herbal Essence Moisturizing Lotion, Normal Skin  Curel Age Defying Therapeutic Moisturizing Lotion with Alpha Hydroxy  Curel Extreme Care Body Lotion  Curel Soothing Hands Moisturizing Hand Lotion  Curel Therapeutic Moisturizing Cream, Fragrance-Free  Curel Therapeutic Moisturizing Lotion, Fragrance-Free  Curel Therapeutic Moisturizing Lotion, Original Formula  Eucerin Daily Replenishing  Lotion  Eucerin Dry Skin Therapy Plus Alpha Hydroxy Crme  Eucerin Dry Skin Therapy Plus Alpha Hydroxy Lotion  Eucerin Original Crme  Eucerin Original Lotion  Eucerin Plus Crme Eucerin Plus Lotion  Eucerin TriLipid Replenishing Lotion  Keri Anti-Bacterial Hand Lotion  Keri Deep Conditioning Original Lotion Dry Skin Formula Softly Scented  Keri Deep Conditioning Original Lotion, Fragrance Free Sensitive Skin Formula  Keri Lotion Fast Absorbing Fragrance Free Sensitive Skin Formula  Keri Lotion Fast Absorbing Softly Scented Dry Skin Formula  Keri Original Lotion  Keri Skin Renewal Lotion Keri Silky Smooth Lotion  Keri Silky Smooth Sensitive Skin Lotion  Nivea Body Creamy Conditioning Oil  Nivea Body Extra Enriched Lotion  Nivea Body Original Lotion  Nivea Body Sheer Moisturizing Lotion Nivea Crme  Nivea Skin Firming Lotion  NutraDerm 30 Skin Lotion  NutraDerm Skin Lotion  NutraDerm Therapeutic Skin Cream  NutraDerm Therapeutic Skin Lotion  ProShield Protective Hand Cream  Provon moisturizing lotion  How to Use an Incentive Spirometer  An incentive spirometer is a tool that measures how well you are filling your lungs with each breath. Learning to take long, deep breaths using this tool can help you keep your lungs clear and active. This may help to reverse or lessen your chance of developing breathing (pulmonary) problems, especially infection. You may be asked to use a spirometer: After a surgery. If you have a lung problem or a history of smoking. After a long period of time when you have been unable to move or be active. If the spirometer includes an indicator to show the highest number that you have reached, your health care provider or respiratory therapist will help you set a goal. Keep a log of your progress as told by your health care provider. What are the risks? Breathing too quickly may cause dizziness or cause you to pass out. Take your time so you do not get dizzy or  light-headed. If you are in pain, you may need to take pain medicine before doing incentive spirometry. It is harder to take a deep breath if you are having pain. How to use your incentive spirometer  Sit up on the edge of your bed or on a chair. Hold the incentive spirometer so that it is in an upright position. Before you use the spirometer, breathe out normally. Place the mouthpiece in your mouth. Make sure your lips are closed tightly around it. Breathe in slowly and as deeply as you can through your mouth, causing the piston or the ball to rise toward the top  of the chamber. Hold your breath for 3-5 seconds, or for as long as possible. If the spirometer includes a coach indicator, use this to guide you in breathing. Slow down your breathing if the indicator goes above the marked areas. Remove the mouthpiece from your mouth and breathe out normally. The piston or ball will return to the bottom of the chamber. Rest for a few seconds, then repeat the steps 10 or more times. Take your time and take a few normal breaths between deep breaths so that you do not get dizzy or light-headed. Do this every 1-2 hours when you are awake. If the spirometer includes a goal marker to show the highest number you have reached (best effort), use this as a goal to work toward during each repetition. After each set of 10 deep breaths, cough a few times. This will help to make sure that your lungs are clear. If you have an incision on your chest or abdomen from surgery, place a pillow or a rolled-up towel firmly against the incision when you cough. This can help to reduce pain while taking deep breaths and coughing. General tips When you are able to get out of bed: Walk around often. Continue to take deep breaths and cough in order to clear your lungs. Keep using the incentive spirometer until your health care provider says it is okay to stop using it. If you have been in the hospital, you may be told to keep  using the spirometer at home. Contact a health care provider if: You are having difficulty using the spirometer. You have trouble using the spirometer as often as instructed. Your pain medicine is not giving enough relief for you to use the spirometer as told. You have a fever. Get help right away if: You develop shortness of breath. You develop a cough with bloody mucus from the lungs. You have fluid or blood coming from an incision site after you cough. Summary An incentive spirometer is a tool that can help you learn to take long, deep breaths to keep your lungs clear and active. You may be asked to use a spirometer after a surgery, if you have a lung problem or a history of smoking, or if you have been inactive for a long period of time. Use your incentive spirometer as instructed every 1-2 hours while you are awake. If you have an incision on your chest or abdomen, place a pillow or a rolled-up towel firmly against your incision when you cough. This will help to reduce pain. Get help right away if you have shortness of breath, you cough up bloody mucus, or blood comes from your incision when you cough. This information is not intended to replace advice given to you by your health care provider. Make sure you discuss any questions you have with your health care provider. Document Revised: 01/14/2020 Document Reviewed: 01/14/2020 Elsevier Patient Education  2023 [employer] Inc.  Preoperative Educational Videos for Total Hip, Knee and Shoulder Replacements  To better prepare for surgery, please view our videos that explain the physical activity and discharge planning required to have the best surgical recovery at [facility].  [email or web address]  Questions? Call [phone] or email [email or web address]     POLAR CARE INFORMATION  [email or web address]  How to use [facility] Polar Care [facility] Therapy  System?  YouTube   [email or web address]  OPERATING INSTRUCTIONS  Start the product With dry hands, connect the transformer to the electrical connection located on the top of the  cooler. Next, plug the transformer into an appropriate electrical outlet. The unit will automatically start running at this point.  To stop the pump, disconnect electrical power.  Unplug to stop the product when not in use. Unplugging the Polar Care unit turns it off. Always unplug immediately after use. Never leave it plugged in while unattended. Remove pad.    FIRST ADD WATER TO FILL LINE, THEN ICE---Replace ice when existing ice is almost melted  1 Discuss Treatment with your Licensed Health Care Practitioner and Use Only as Prescribed 2 Apply Insulation Barrier & Cold Therapy Pad 3 Check for Moisture 4 Inspect Skin Regularly  Tips and Trouble Shooting Usage Tips 1. Use cubed or chunked ice for optimal performance. 2. It is recommended to drain the Pad between uses. To drain the pad, hold the Pad upright with the hose pointed toward the ground. Depress the black plunger and allow water to drain out. 3. You may disconnect the Pad from the unit without removing the pad from the affected area by depressing the silver tabs on the hose coupling and gently pulling the hoses apart. The Pad and unit will seal itself and will not leak. Note: Some dripping during release is normal. 4. DO NOT RUN PUMP WITHOUT WATER! The pump in this unit is designed to run with water. Running the unit without water will cause permanent damage to the pump. 5. Unplug unit before removing lid.  TROUBLESHOOTING GUIDE Pump not running, Water not flowing to the pad, Pad is not getting cold 1. Make sure the transformer is plugged into the wall outlet. 2. Confirm that the ice and water are filled to the indicated levels. 3. Make sure there are no kinks in the pad. 4. Gently pull on the blue tube to make sure the tube/pad  junction is straight. 5. Remove the pad from the treatment site and ll it while the pad is lying at; then reapply. 6. Confirm that the pad couplings are securely attached to the unit. Listen for the double clicks (Figure 1) to confirm the pad couplings are securely attached.  Leaks    Note: Some condensation on the lines, controller, and pads is unavoidable, especially in warmer climates. 1. If using a Breg Polar Care Cold Therapy unit with a detachable Cold Therapy Pad, and a leak exists (other than condensation on the lines) disconnect the pad couplings. Make sure the silver tabs on the couplings are depressed before reconnecting the pad to the pump hose; then confirm both sides of the coupling are properly clicked in. 2. If the coupling continues to leak or a leak is detected in the pad itself, stop using it and call [employer] Customer Care at [phone].  Cleaning After use, empty and dry the unit with a soft cloth. Warm water and mild detergent may be used occasionally to clean the pump and tubes.  WARNING: The Polar Care Cube can be cold enough to cause serious injury, including full skin necrosis. Follow these Operating Instructions, and carefully read the Product Insert (see pouch on side of unit) and the Cold Therapy Pad Fitting Instructions (provided with each Cold Therapy Pad) prior to use.

## 2024-09-24 ENCOUNTER — Other Ambulatory Visit: Payer: Self-pay

## 2024-09-24 ENCOUNTER — Encounter: Admission: RE | Disposition: A | Payer: Self-pay | Source: Home / Self Care | Attending: Orthopedic Surgery

## 2024-09-24 ENCOUNTER — Encounter: Payer: Self-pay | Admitting: Orthopedic Surgery

## 2024-09-24 ENCOUNTER — Ambulatory Visit

## 2024-09-24 ENCOUNTER — Ambulatory Visit
Admission: RE | Admit: 2024-09-24 | Discharge: 2024-09-25 | Disposition: A | Discharge status: Home Health | Attending: Orthopedic Surgery | Admitting: Orthopedic Surgery

## 2024-09-24 ENCOUNTER — Ambulatory Visit: Payer: Self-pay | Admitting: Urgent Care

## 2024-09-24 DIAGNOSIS — M1712 Unilateral primary osteoarthritis, left knee: Secondary | ICD-10-CM | POA: Insufficient documentation

## 2024-09-24 DIAGNOSIS — Z96652 Presence of left artificial knee joint: Secondary | ICD-10-CM | POA: Diagnosis not present

## 2024-09-24 DIAGNOSIS — Z7982 Long term (current) use of aspirin: Secondary | ICD-10-CM | POA: Diagnosis not present

## 2024-09-24 DIAGNOSIS — I10 Essential (primary) hypertension: Secondary | ICD-10-CM | POA: Diagnosis not present

## 2024-09-24 DIAGNOSIS — Z09 Encounter for follow-up examination after completed treatment for conditions other than malignant neoplasm: Secondary | ICD-10-CM

## 2024-09-24 DIAGNOSIS — Z79899 Other long term (current) drug therapy: Secondary | ICD-10-CM | POA: Diagnosis not present

## 2024-09-24 DIAGNOSIS — I2510 Atherosclerotic heart disease of native coronary artery without angina pectoris: Secondary | ICD-10-CM | POA: Diagnosis not present

## 2024-09-24 DIAGNOSIS — R519 Headache, unspecified: Secondary | ICD-10-CM | POA: Diagnosis not present

## 2024-09-24 DIAGNOSIS — G43909 Migraine, unspecified, not intractable, without status migrainosus: Secondary | ICD-10-CM | POA: Diagnosis not present

## 2024-09-24 DIAGNOSIS — F32A Depression, unspecified: Secondary | ICD-10-CM | POA: Diagnosis not present

## 2024-09-24 DIAGNOSIS — Z471 Aftercare following joint replacement surgery: Secondary | ICD-10-CM | POA: Diagnosis not present

## 2024-09-24 HISTORY — PX: TOTAL KNEE ARTHROPLASTY: SHX125

## 2024-09-24 SURGERY — ARTHROPLASTY, KNEE, TOTAL
Anesthesia: General | Site: Knee | Laterality: Left

## 2024-09-24 MED ORDER — ONDANSETRON HCL 4 MG PO TABS
4.0000 mg | ORAL_TABLET | Freq: Four times a day (QID) | ORAL | 0 refills | Status: AC | PRN
  Filled 2024-09-24: qty 10, 3d supply, fill #0

## 2024-09-24 MED ORDER — ACETAMINOPHEN 325 MG PO TABS: 325.0000 mg | ORAL_TABLET | Freq: Four times a day (QID) | ORAL | Status: DC | PRN

## 2024-09-24 MED ORDER — TRAMADOL HCL 50 MG PO TABS
50.0000 mg | ORAL_TABLET | Freq: Four times a day (QID) | ORAL | Status: DC | PRN
  Administered 2024-09-24: 50 mg via ORAL
  Filled 2024-09-24: qty 1

## 2024-09-24 MED ORDER — ACETAMINOPHEN 10 MG/ML IV SOLN
INTRAVENOUS | Status: DC | PRN
  Administered 2024-09-24: 1000 mg via INTRAVENOUS

## 2024-09-24 MED ORDER — TRANEXAMIC ACID-NACL 1000-0.7 MG/100ML-% IV SOLN
1000.0000 mg | INTRAVENOUS | Status: AC
  Administered 2024-09-24 (×2): 1000 mg via INTRAVENOUS

## 2024-09-24 MED ORDER — FINASTERIDE 5 MG PO TABS
5.0000 mg | ORAL_TABLET | Freq: Every day | ORAL | Status: DC
  Administered 2024-09-25: 5 mg via ORAL
  Filled 2024-09-24: qty 1

## 2024-09-24 MED ORDER — SUGAMMADEX SODIUM 200 MG/2ML IV SOLN
INTRAVENOUS | Status: DC | PRN
  Administered 2024-09-24: 200 mg via INTRAVENOUS

## 2024-09-24 MED ORDER — TRANEXAMIC ACID-NACL 1000-0.7 MG/100ML-% IV SOLN
INTRAVENOUS | Status: AC
  Filled 2024-09-24: qty 100

## 2024-09-24 MED ORDER — MIRTAZAPINE 30 MG PO TABS
30.0000 mg | ORAL_TABLET | Freq: Every day | ORAL | Status: DC
  Administered 2024-09-24: 30 mg via ORAL
  Filled 2024-09-24: qty 1
  Filled 2024-09-24: qty 2

## 2024-09-24 MED ORDER — ROCURONIUM BROMIDE 10 MG/ML (PF) SYRINGE
PREFILLED_SYRINGE | INTRAVENOUS | Status: AC
  Filled 2024-09-24: qty 10

## 2024-09-24 MED ORDER — ENOXAPARIN SODIUM 40 MG/0.4ML IJ SOSY
40.0000 mg | PREFILLED_SYRINGE | INTRAMUSCULAR | 0 refills | Status: AC
  Filled 2024-09-24: qty 5.6, 14d supply, fill #0

## 2024-09-24 MED ORDER — ONDANSETRON HCL 4 MG/2ML IJ SOLN
INTRAMUSCULAR | Status: AC
  Filled 2024-09-24: qty 2

## 2024-09-24 MED ORDER — LIDOCAINE HCL (PF) 2 % IJ SOLN
INTRAMUSCULAR | Status: AC
  Filled 2024-09-24: qty 5

## 2024-09-24 MED ORDER — SODIUM CHLORIDE (PF) 0.9 % IJ SOLN
INTRAMUSCULAR | Status: AC
  Filled 2024-09-24: qty 20

## 2024-09-24 MED ORDER — FENTANYL CITRATE (PF) 100 MCG/2ML IJ SOLN
INTRAMUSCULAR | Status: AC
  Filled 2024-09-24: qty 2

## 2024-09-24 MED ORDER — CEFAZOLIN SODIUM-DEXTROSE 2-4 GM/100ML-% IV SOLN
2.0000 g | INTRAVENOUS | Status: AC
  Administered 2024-09-24: 2 g via INTRAVENOUS

## 2024-09-24 MED ORDER — CEFAZOLIN SODIUM-DEXTROSE 2-4 GM/100ML-% IV SOLN
INTRAVENOUS | Status: AC
  Filled 2024-09-24: qty 100

## 2024-09-24 MED ORDER — AMLODIPINE BESYLATE 10 MG PO TABS
10.0000 mg | ORAL_TABLET | Freq: Every day | ORAL | Status: DC
  Administered 2024-09-25: 10 mg via ORAL
  Filled 2024-09-24: qty 1

## 2024-09-24 MED ORDER — DEXAMETHASONE SOD PHOSPHATE PF 10 MG/ML IJ SOLN
8.0000 mg | Freq: Once | INTRAMUSCULAR | Status: AC
  Administered 2024-09-24: 10 mg via INTRAVENOUS

## 2024-09-24 MED ORDER — DOCUSATE SODIUM 100 MG PO CAPS
100.0000 mg | ORAL_CAPSULE | Freq: Two times a day (BID) | ORAL | Status: DC
  Administered 2024-09-24 – 2024-09-25 (×2): 100 mg via ORAL
  Filled 2024-09-24 (×2): qty 1

## 2024-09-24 MED ORDER — PANTOPRAZOLE SODIUM 40 MG PO TBEC
40.0000 mg | DELAYED_RELEASE_TABLET | Freq: Every day | ORAL | Status: DC
  Administered 2024-09-25: 40 mg via ORAL
  Filled 2024-09-24: qty 1

## 2024-09-24 MED ORDER — ENOXAPARIN SODIUM 30 MG/0.3ML IJ SOSY
30.0000 mg | PREFILLED_SYRINGE | Freq: Two times a day (BID) | INTRAMUSCULAR | Status: DC
  Administered 2024-09-25: 30 mg via SUBCUTANEOUS
  Filled 2024-09-24: qty 0.3

## 2024-09-24 MED ORDER — SURGIPHOR WOUND IRRIGATION SYSTEM - OPTIME
TOPICAL | Status: DC | PRN
  Administered 2024-09-24: 450 mL via TOPICAL

## 2024-09-24 MED ORDER — ATENOLOL 25 MG PO TABS
25.0000 mg | ORAL_TABLET | Freq: Every day | ORAL | Status: DC
  Administered 2024-09-24 – 2024-09-25 (×2): 25 mg via ORAL
  Filled 2024-09-24 (×2): qty 1

## 2024-09-24 MED ORDER — KETOROLAC TROMETHAMINE 15 MG/ML IJ SOLN
INTRAMUSCULAR | Status: AC
Start: 2024-09-24 — End: 2024-09-24
  Filled 2024-09-24: qty 1

## 2024-09-24 MED ORDER — CEFAZOLIN SODIUM-DEXTROSE 2-4 GM/100ML-% IV SOLN
2.0000 g | Freq: Four times a day (QID) | INTRAVENOUS | Status: AC
  Administered 2024-09-24 – 2024-09-25 (×2): 2 g via INTRAVENOUS
  Filled 2024-09-24 (×2): qty 100

## 2024-09-24 MED ORDER — PHENYLEPHRINE 80 MCG/ML (10ML) SYRINGE FOR IV PUSH (FOR BLOOD PRESSURE SUPPORT)
PREFILLED_SYRINGE | INTRAVENOUS | Status: AC
  Filled 2024-09-24: qty 10

## 2024-09-24 MED ORDER — FENTANYL CITRATE (PF) 100 MCG/2ML IJ SOLN
25.0000 ug | INTRAMUSCULAR | Status: DC | PRN
  Administered 2024-09-24: 50 ug via INTRAVENOUS
  Administered 2024-09-24 (×2): 25 ug via INTRAVENOUS
  Administered 2024-09-24 (×2): 50 ug via INTRAVENOUS

## 2024-09-24 MED ORDER — ACETAMINOPHEN 500 MG PO TABS
1000.0000 mg | ORAL_TABLET | Freq: Three times a day (TID) | ORAL | 0 refills | Status: AC
  Filled 2024-09-24: qty 30, 5d supply, fill #0

## 2024-09-24 MED ORDER — HYDROMORPHONE HCL 1 MG/ML IJ SOLN
INTRAMUSCULAR | Status: AC
  Filled 2024-09-24: qty 1

## 2024-09-24 MED ORDER — METOCLOPRAMIDE HCL 5 MG/ML IJ SOLN
INTRAMUSCULAR | Status: AC
  Filled 2024-09-24: qty 2

## 2024-09-24 MED ORDER — ATENOLOL-CHLORTHALIDONE 50-25 MG PO TABS: 0.5000 | ORAL_TABLET | Freq: Every day | ORAL | Status: DC

## 2024-09-24 MED ORDER — PROPOFOL 10 MG/ML IV BOLUS
INTRAVENOUS | Status: DC | PRN
  Administered 2024-09-24: 90 mg via INTRAVENOUS

## 2024-09-24 MED ORDER — BUPIVACAINE-EPINEPHRINE (PF) 0.25% -1:200000 IJ SOLN
INTRAMUSCULAR | Status: DC | PRN
  Administered 2024-09-24: 30 mL

## 2024-09-24 MED ORDER — ORAL CARE MOUTH RINSE: 15.0000 mL | Freq: Once | OROMUCOSAL | Status: DC

## 2024-09-24 MED ORDER — KETOROLAC TROMETHAMINE 15 MG/ML IJ SOLN
7.5000 mg | Freq: Four times a day (QID) | INTRAMUSCULAR | Status: DC
  Administered 2024-09-24 – 2024-09-25 (×3): 7.5 mg via INTRAVENOUS
  Filled 2024-09-24 (×2): qty 1

## 2024-09-24 MED ORDER — TIMOLOL MALEATE 0.5 % OP SOLN
1.0000 [drp] | Freq: Every day | OPHTHALMIC | Status: DC
  Administered 2024-09-25: 1 [drp] via OPHTHALMIC
  Filled 2024-09-24 (×2): qty 5

## 2024-09-24 MED ORDER — OXYCODONE HCL 5 MG PO TABS
5.0000 mg | ORAL_TABLET | Freq: Three times a day (TID) | ORAL | 0 refills | Status: AC | PRN
  Filled 2024-09-24: qty 20, 7d supply, fill #0

## 2024-09-24 MED ORDER — SODIUM CHLORIDE 0.9 % IV SOLN
INTRAVENOUS | Status: DC | PRN
  Administered 2024-09-24: 60 mL

## 2024-09-24 MED ORDER — PROPOFOL 10 MG/ML IV BOLUS
INTRAVENOUS | Status: AC
Start: 2024-09-24 — End: 2024-09-24
  Filled 2024-09-24: qty 20

## 2024-09-24 MED ORDER — OXYCODONE HCL 5 MG PO TABS: 5.0000 mg | ORAL_TABLET | Freq: Once | ORAL | Status: AC | PRN

## 2024-09-24 MED ORDER — ROSUVASTATIN CALCIUM 10 MG PO TABS
20.0000 mg | ORAL_TABLET | Freq: Every day | ORAL | Status: DC
  Administered 2024-09-25: 20 mg via ORAL
  Filled 2024-09-24: qty 2

## 2024-09-24 MED ORDER — ONDANSETRON HCL 4 MG/2ML IJ SOLN
4.0000 mg | Freq: Four times a day (QID) | INTRAMUSCULAR | Status: DC | PRN
  Administered 2024-09-24 (×2): 4 mg via INTRAVENOUS
  Filled 2024-09-24: qty 2

## 2024-09-24 MED ORDER — HYDROCODONE-ACETAMINOPHEN 5-325 MG PO TABS
1.0000 | ORAL_TABLET | ORAL | Status: DC | PRN
  Administered 2024-09-25: 1 via ORAL
  Filled 2024-09-24: qty 1

## 2024-09-24 MED ORDER — ROCURONIUM BROMIDE 100 MG/10ML IV SOLN
INTRAVENOUS | Status: DC | PRN
Start: 2024-09-24 — End: 2024-09-24
  Administered 2024-09-24: 60 mg via INTRAVENOUS
  Administered 2024-09-24: 10 mg via INTRAVENOUS

## 2024-09-24 MED ORDER — ONDANSETRON HCL 4 MG PO TABS: 4.0000 mg | ORAL_TABLET | Freq: Four times a day (QID) | ORAL | Status: DC | PRN

## 2024-09-24 MED ORDER — TRAMADOL HCL 50 MG PO TABS
50.0000 mg | ORAL_TABLET | Freq: Four times a day (QID) | ORAL | 0 refills | Status: AC | PRN
  Filled 2024-09-24: qty 30, 8d supply, fill #0

## 2024-09-24 MED ORDER — SODIUM CHLORIDE 0.9 % IV SOLN: INTRAVENOUS | Status: DC

## 2024-09-24 MED ORDER — MORPHINE SULFATE (PF) 2 MG/ML IV SOLN: 0.5000 mg | INTRAVENOUS | Status: DC | PRN

## 2024-09-24 MED ORDER — TAMSULOSIN HCL 0.4 MG PO CAPS
0.4000 mg | ORAL_CAPSULE | Freq: Every day | ORAL | Status: DC
  Administered 2024-09-25: 0.4 mg via ORAL
  Filled 2024-09-24: qty 1

## 2024-09-24 MED ORDER — KETOROLAC TROMETHAMINE 30 MG/ML IJ SOLN
INTRAMUSCULAR | Status: AC
  Filled 2024-09-24: qty 1

## 2024-09-24 MED ORDER — CHLORHEXIDINE GLUCONATE 0.12 % MT SOLN
OROMUCOSAL | Status: AC
  Filled 2024-09-24: qty 15

## 2024-09-24 MED ORDER — MENTHOL 3 MG MT LOZG: 1.0000 | LOZENGE | OROMUCOSAL | Status: DC | PRN

## 2024-09-24 MED ORDER — LACTATED RINGERS IV SOLN: INTRAVENOUS | Status: DC

## 2024-09-24 MED ORDER — OXYCODONE HCL 5 MG/5ML PO SOLN
ORAL | Status: AC
  Filled 2024-09-24: qty 5

## 2024-09-24 MED ORDER — LATANOPROST 0.005 % OP SOLN
1.0000 [drp] | Freq: Every day | OPHTHALMIC | Status: DC
  Administered 2024-09-24: 1 [drp] via OPHTHALMIC
  Filled 2024-09-24: qty 2.5

## 2024-09-24 MED ORDER — FENTANYL CITRATE (PF) 100 MCG/2ML IJ SOLN
INTRAMUSCULAR | Status: DC | PRN
  Administered 2024-09-24 (×2): 50 ug via INTRAVENOUS

## 2024-09-24 MED ORDER — CHLORTHALIDONE 25 MG PO TABS
12.5000 mg | ORAL_TABLET | Freq: Every day | ORAL | Status: DC
  Administered 2024-09-24 – 2024-09-25 (×2): 12.5 mg via ORAL
  Filled 2024-09-24 (×3): qty 0.5

## 2024-09-24 MED ORDER — OXYCODONE HCL 5 MG/5ML PO SOLN
5.0000 mg | Freq: Once | ORAL | Status: AC | PRN
  Administered 2024-09-24: 5 mg via ORAL

## 2024-09-24 MED ORDER — EPHEDRINE SULFATE-NACL 50-0.9 MG/10ML-% IV SOSY
PREFILLED_SYRINGE | INTRAVENOUS | Status: DC | PRN
Start: 2024-09-24 — End: 2024-09-24
  Administered 2024-09-24: 5 mg via INTRAVENOUS
  Administered 2024-09-24: 10 mg via INTRAVENOUS
  Administered 2024-09-24: 5 mg via INTRAVENOUS

## 2024-09-24 MED ORDER — CELECOXIB 200 MG PO CAPS
200.0000 mg | ORAL_CAPSULE | Freq: Two times a day (BID) | ORAL | 0 refills | Status: AC
  Filled 2024-09-24: qty 28, 14d supply, fill #0

## 2024-09-24 MED ORDER — BUPIVACAINE-EPINEPHRINE (PF) 0.25% -1:200000 IJ SOLN
INTRAMUSCULAR | Status: AC
  Filled 2024-09-24: qty 30

## 2024-09-24 MED ORDER — SODIUM CHLORIDE 0.9 % IR SOLN
Status: DC | PRN
  Administered 2024-09-24: 1000 mL

## 2024-09-24 MED ORDER — METOCLOPRAMIDE HCL 10 MG PO TABS: 5.0000 mg | ORAL_TABLET | Freq: Three times a day (TID) | ORAL | Status: DC | PRN

## 2024-09-24 MED ORDER — ONDANSETRON HCL 4 MG/2ML IJ SOLN
INTRAMUSCULAR | Status: DC | PRN
  Administered 2024-09-24: 4 mg via INTRAVENOUS

## 2024-09-24 MED ORDER — LIDOCAINE HCL (CARDIAC) PF 100 MG/5ML IV SOSY
PREFILLED_SYRINGE | INTRAVENOUS | Status: DC | PRN
Start: 2024-09-24 — End: 2024-09-24
  Administered 2024-09-24: 100 mg via INTRAVENOUS

## 2024-09-24 MED ORDER — BUPIVACAINE LIPOSOME 1.3 % IJ SUSP
INTRAMUSCULAR | Status: AC
  Filled 2024-09-24: qty 20

## 2024-09-24 MED ORDER — METOCLOPRAMIDE HCL 5 MG/ML IJ SOLN
5.0000 mg | Freq: Three times a day (TID) | INTRAMUSCULAR | Status: DC | PRN
  Administered 2024-09-24: 10 mg via INTRAVENOUS

## 2024-09-24 MED ORDER — ACETAMINOPHEN 10 MG/ML IV SOLN
INTRAVENOUS | Status: AC
  Filled 2024-09-24: qty 100

## 2024-09-24 MED ORDER — SEVOFLURANE IN SOLN
RESPIRATORY_TRACT | Status: AC
  Filled 2024-09-24: qty 250

## 2024-09-24 MED ORDER — PHENOL 1.4 % MT LIQD
1.0000 | OROMUCOSAL | Status: DC | PRN
Start: 2024-09-24 — End: 2024-09-25
  Administered 2024-09-24: 1 via OROMUCOSAL
  Filled 2024-09-24: qty 177

## 2024-09-24 MED ORDER — PHENYLEPHRINE 80 MCG/ML (10ML) SYRINGE FOR IV PUSH (FOR BLOOD PRESSURE SUPPORT)
PREFILLED_SYRINGE | INTRAVENOUS | Status: DC | PRN
  Administered 2024-09-24 (×4): 80 ug via INTRAVENOUS

## 2024-09-24 MED ORDER — ACETAMINOPHEN 500 MG PO TABS
1000.0000 mg | ORAL_TABLET | Freq: Three times a day (TID) | ORAL | Status: DC
  Administered 2024-09-24 – 2024-09-25 (×2): 1000 mg via ORAL
  Filled 2024-09-24 (×3): qty 2

## 2024-09-24 MED ORDER — CHLORHEXIDINE GLUCONATE 0.12 % MT SOLN
15.0000 mL | Freq: Once | OROMUCOSAL | Status: DC
  Administered 2024-09-24: 15 mL via OROMUCOSAL

## 2024-09-24 MED ORDER — DOCUSATE SODIUM 100 MG PO CAPS
100.0000 mg | ORAL_CAPSULE | Freq: Two times a day (BID) | ORAL | 0 refills | Status: AC
  Filled 2024-09-24: qty 10, 5d supply, fill #0

## 2024-09-24 MED ORDER — ASPIRIN 81 MG PO CHEW
81.0000 mg | CHEWABLE_TABLET | Freq: Every day | ORAL | Status: DC
  Administered 2024-09-25: 81 mg via ORAL
  Filled 2024-09-24: qty 1

## 2024-09-24 MED ORDER — HYDROMORPHONE HCL 1 MG/ML IJ SOLN
INTRAMUSCULAR | Status: DC | PRN
  Administered 2024-09-24: .5 mg via INTRAVENOUS

## 2024-09-24 MED ORDER — CEPHALEXIN 500 MG PO CAPS
500.0000 mg | ORAL_CAPSULE | Freq: Four times a day (QID) | ORAL | 0 refills | Status: AC
  Filled 2024-09-24: qty 28, 7d supply, fill #0

## 2024-09-24 SURGICAL SUPPLY — 57 items
BLADE PATELLA W-PILOT HOLE 35 (BLADE) IMPLANT
BLADE SAW 90X13X1.19 OSCILLAT (BLADE) IMPLANT
BLADE SAW SAG 25X90X1.19 (BLADE) ×1 IMPLANT
BLADE SAW SAG 29X58X.64 (BLADE) ×1 IMPLANT
BNDG ELASTIC 6INX 5YD STR LF (GAUZE/BANDAGES/DRESSINGS) ×1 IMPLANT
BOWL CEMENT MIX W/ADAPTER (MISCELLANEOUS) IMPLANT
BRUSH SCRUB EZ PLAIN DRY (MISCELLANEOUS) IMPLANT
CHLORAPREP W/TINT 26 (MISCELLANEOUS) ×2 IMPLANT
COMPONENT FEM PS STD 11 LT (Joint) IMPLANT
COMPONENT PATELLA 3 PEG 35 (Joint) IMPLANT
COMPONENT TIB KNEE H 0D LT (Joint) IMPLANT
COOLER ICEMAN CLASSIC (MISCELLANEOUS) ×1 IMPLANT
CUFF TRNQT CYL 24X4X16.5-23 (TOURNIQUET CUFF) IMPLANT
CUFF TRNQT CYL 30X4X21-28X (TOURNIQUET CUFF) IMPLANT
DERMABOND ADVANCED .7 DNX12 (GAUZE/BANDAGES/DRESSINGS) ×1 IMPLANT
DRAPE SHEET LG 3/4 BI-LAMINATE (DRAPES) ×2 IMPLANT
DRSG MEPILEX SACRM 8.7X9.8 (GAUZE/BANDAGES/DRESSINGS) ×1 IMPLANT
DRSG OPSITE POSTOP 4X10 (GAUZE/BANDAGES/DRESSINGS) IMPLANT
DRSG OPSITE POSTOP 4X8 (GAUZE/BANDAGES/DRESSINGS) IMPLANT
ELECTRODE REM PT RTRN 9FT ADLT (ELECTROSURGICAL) ×1 IMPLANT
GLOVE BIO SURGEON STRL SZ8 (GLOVE) ×1 IMPLANT
GLOVE BIOGEL PI IND STRL 8 (GLOVE) ×1 IMPLANT
GLOVE PI ORTHO PRO STRL 7.5 (GLOVE) ×2 IMPLANT
GLOVE PI ORTHO PRO STRL SZ8 (GLOVE) ×2 IMPLANT
GLOVE SURG SYN 7.5 PF PI (GLOVE) ×1 IMPLANT
GOWN SRG XL LONG LVL 3 NONREIN (GOWNS) ×1 IMPLANT
GOWN SRG XL LVL 3 NONREINFORCE (GOWNS) ×1 IMPLANT
GOWN STRL REUS W/ TWL LRG LVL3 (GOWN DISPOSABLE) ×1 IMPLANT
HOOD PEEL AWAY T7 (MISCELLANEOUS) ×2 IMPLANT
INSERT COMP PS 8-11 GH LT (Insert) IMPLANT
KIT TURNOVER KIT A (KITS) ×1 IMPLANT
MANIFOLD NEPTUNE II (INSTRUMENTS) ×1 IMPLANT
MARKER SKIN DUAL TIP RULER LAB (MISCELLANEOUS) ×1 IMPLANT
MAT ABSORB FLUID 56X50 GRAY (MISCELLANEOUS) ×1 IMPLANT
NDL HYPO 21X1.5 SAFETY (NEEDLE) ×1 IMPLANT
NEEDLE HYPO 21X1.5 SAFETY (NEEDLE) ×1 IMPLANT
PACK TOTAL KNEE (MISCELLANEOUS) ×1 IMPLANT
PAD ARMBOARD POSITIONER FOAM (MISCELLANEOUS) ×3 IMPLANT
PAD COLD UNI WRAP-ON (PAD) ×1 IMPLANT
PENCIL SMOKE EVACUATOR (MISCELLANEOUS) ×1 IMPLANT
PIN DRILL HDLS TROCAR 75 4PK (PIN) IMPLANT
SCREW FEMALE HEX FIX 25X2.5 (ORTHOPEDIC DISPOSABLE SUPPLIES) IMPLANT
SCREW HEX HEADED 3.5X27 DISP (ORTHOPEDIC DISPOSABLE SUPPLIES) IMPLANT
SLEEVE SCD COMPRESS KNEE MED (STOCKING) ×1 IMPLANT
SOL .9 NS 3000ML IRR UROMATIC (IV SOLUTION) ×1 IMPLANT
SOLN STERILE WATER BTL 1000 ML (IV SOLUTION) ×1 IMPLANT
SOLUTION IRRIG SURGIPHOR (IV SOLUTION) ×1 IMPLANT
STOCKINETTE IMPERV 14X48 (MISCELLANEOUS) ×1 IMPLANT
SUT STRATAFIX 14 PDO 36 VLT (SUTURE) ×1 IMPLANT
SUT VIC AB 0 CT1 36 (SUTURE) ×1 IMPLANT
SUT VIC AB 2-0 CT2 27 (SUTURE) ×2 IMPLANT
SUTURE STRATA SPIR 4-0 18 (SUTURE) ×1 IMPLANT
SUTURE VICRYL 1-0 27IN ABS (SUTURE) ×1 IMPLANT
SYR 20ML LL LF (SYRINGE) ×2 IMPLANT
TAPE CLOTH 3X10 WHT NS LF (GAUZE/BANDAGES/DRESSINGS) ×1 IMPLANT
TIP FAN IRRIG PULSAVAC PLUS (DISPOSABLE) ×1 IMPLANT
TRAP FLUID SMOKE EVACUATOR (MISCELLANEOUS) ×1 IMPLANT

## 2024-09-24 NOTE — Interval H&P Note (Signed)
 Patient history and physical updated. Consent reviewed including risks, benefits, and alternatives to surgery. Patient agrees with above plan to proceed with left total knee arthroplasty.

## 2024-09-24 NOTE — Discharge Summary (Incomplete Revision)
 Physician Discharge Summary  Patient ID: Brett Bender MRN: [id number] DOB/AGE: 1946/04/14 78 y.o.  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]   Discharge Diagnoses: Patient Active Problem List   Diagnosis Date Noted   S/P TKR (total knee replacement), left 09/24/2024   Adrenal adenoma, left 03/16/2024   Aortic atherosclerosis 03/16/2024   Atherosclerosis of native coronary artery of native heart without angina pectoris 03/16/2023   History of sepsis 02/23/2023   Elevated glucose 02/23/2023   Proteinuria 02/23/2023   Elevated lipids 02/23/2023   Compulsive skin picking 02/23/2023   Right flank pain 02/08/2023   Spleen enlarged 07/02/2019   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   Depression 03/16/2016   Nocturia 02/11/2016   Hydronephrosis of left kidney 12/08/2015   History of nephrolithiasis 11/24/2015   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   Basal cell carcinoma of skin 11/18/2015   Neck pain 11/18/2015   Insomnia 05/01/2015   BPH (benign prostatic hyperplasia) 12/14/2012   History of shingles 05/08/2009   Lumbar radiculopathy 12/03/2008   Lumbosacral neuritis 12/03/2008   Essential (primary) hypertension 11/09/1999    Past Medical History:  Diagnosis Date   Actinic keratosis    Arthritis    Basal cell carcinoma 11/09/2013   R lateral edge of clavicle   Basal cell carcinoma 02/03/2016   L lateral supraclavicular base of neck/excision   Basal cell carcinoma 11/29/2018   L upper back   Basal cell carcinoma 09/11/2020   R mid back    Basal cell carcinoma 03/17/2023   right inferior post auricular, excised 04/19/23   Basal cell carcinoma 09/15/2023   Left Superior Posterior Shoulder, EDC   Cancer (HCC)    Basal cell carcinoma bilateral arms and left shoulder   Complication of anesthesia    slow to wake up with shoulder surgery    Depression    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/03/2023   Enlarged prostate    Epidural abscess 03/02/2023   Epidural abscess    H/O elbow surgery    left   Herpes simplex    History of kidney stones    History of MRSA infection 2004   skin around eye   Hypertension    Kidney stone    Lumbar stenosis    Migraine headache    Motion sickness    ocean boats   MSSA bacteremia 02/09/2023   Overdose opiate, accidental or unintentional, initial encounter (HCC) 02/08/2023   PONV (postoperative nausea and vomiting)    Primary osteoarthritis of left knee    Tendonitis of ankle, left      Transfusion: none   Consultants (if any):   Discharged Condition: Improved  Hospital Course: [name] is an 78 y.o. male who was admitted 09/24/2024 with a diagnosis of S/P TKR (total knee replacement), left and went to the operating room on 09/24/2024 and underwent the above named procedures.    Surgeries: Procedure(s): ARTHROPLASTY, KNEE, TOTAL on 09/24/2024 Patient tolerated the surgery well. Taken to PACU where she was stabilized and then transferred to the orthopedic floor.  Started on Lovenox  30 mg q 12 hrs. TEDs and SCDs applied bilaterally. Heels elevated on bed. No evidence of DVT. Negative Homan. Physical therapy started on day #1 for gait training and transfer. OT started day #1 for ADL and assisted devices.  Patient's IV was d/c on day #1. Patient was able to safely and independently complete all PT  goals. PT recommending discharge to home.    On post op day #1 patient was stable and ready for discharge to home with HHPT.  Implants: Femur: Persona Size 11 CR PPS   Tibia: Persona Size H OsseoTi  Poly: 11mm MC  Patella: 35x9.40mm symmetric    He was given perioperative antibiotics:  Anti-infectives (From admission, onward)    Start     Dose/Rate Route Frequency Ordered Stop   09/24/24 2000  ceFAZolin  (ANCEF ) IVPB 2g/100 mL premix        2 g 200 mL/hr over 30 Minutes Intravenous  Every 6 hours 09/24/24 1658 09/25/24 0759   09/24/24 1145  ceFAZolin  (ANCEF ) IVPB 2g/100 mL premix        2 g 200 mL/hr over 30 Minutes Intravenous On call to O.R. 09/24/24 1131 09/24/24 1358   09/24/24 0000  cephALEXin (KEFLEX) 500 MG capsule        500 mg Oral 4 times daily 09/24/24 1839 10/01/24 2359     .  He was given sequential compression devices, early ambulation, and Lovenox  TEDs for DVT prophylaxis.  He benefited maximally from the hospital stay and there were no complications.    Recent vital signs:  Vitals:   09/24/24 1730 09/24/24 1745  BP: 137/74 136/82  Pulse: 75 76  Resp: 12 (!) 26  Temp:    SpO2: 95% 93%    Recent laboratory studies:  Lab Results  Component Value Date   HGB 15.8 09/17/2024   HGB 15.6 03/16/2024   HGB 12.1 (L) 03/08/2023   Lab Results  Component Value Date   WBC 9.9 09/17/2024   PLT 255 09/17/2024   Lab Results  Component Value Date   INR 1.1 03/02/2023   Lab Results  Component Value Date   NA 140 09/17/2024   K 3.9 09/17/2024   CL 101 09/17/2024   CO2 26 09/17/2024   BUN 27 (H) 09/17/2024   CREATININE 1.04 09/17/2024   GLUCOSE 101 (H) 09/17/2024    Discharge Medications:   Allergies as of 09/24/2024       Reactions   Penicillins Other (See Comments)   Stomach Upset        Medication List     STOP taking these medications    diclofenac  50 MG EC tablet Commonly known as: VOLTAREN        TAKE these medications    acetaminophen  500 MG tablet Commonly known as: TYLENOL  Take 2 tablets (1,000 mg total) by mouth every 8 (eight) hours.   amLODipine  10 MG tablet Commonly known as: NORVASC  TAKE ONE TABLET BY MOUTH ONCE A DAY   aspirin  81 MG chewable tablet Chew by mouth daily.   atenolol -chlorthalidone  50-25 MG tablet Commonly known as: TENORETIC  TAKE ONE HALF (1/2) TABLET BY MOUTH ONCE DAILY   celecoxib 200 MG capsule Commonly known as: CeleBREX Take 1 capsule (200 mg total) by mouth 2 (two) times daily  for 14 days.   cephALEXin 500 MG capsule Commonly known as: KEFLEX Take 1 capsule (500 mg total) by mouth 4 (four) times daily for 7 days.   diclofenac  Sodium 1 % Gel Commonly known as: VOLTAREN  APPLY FOUR GRAMS TOPICALLY FIVE TIMES DAILY   docusate sodium  100 MG capsule Commonly known as: COLACE Take 1 capsule (100 mg total) by mouth 2 (two) times daily.   enoxaparin  40 MG/0.4ML injection Commonly known as: LOVENOX  Inject 0.4 mLs (40 mg total) into the skin daily for 14 days.   finasteride  5 MG tablet  Commonly known as: PROSCAR  TAKE ONE TABLET (5 MG TOTAL) BY MOUTH DAILY.   ketoconazole  2 % shampoo Commonly known as: NIZORAL  3 times weekly lather on scalp, leave on 5-8 minutes, rinse well.   latanoprost  0.005 % ophthalmic solution Commonly known as: XALATAN  Place 1 drop into both eyes at bedtime.   Metamucil Fiber + Probiotics Chew Chew 1 Piece by mouth in the morning, at noon, and at bedtime.   mirtazapine  30 MG tablet Commonly known as: REMERON  TAKE ONE TABLET BY MOUTH EVERY NIGHT AT BEDTIME   multivitamin tablet Take 1 tablet by mouth daily.   ondansetron  4 MG tablet Commonly known as: ZOFRAN  Take 1 tablet (4 mg total) by mouth every 6 (six) hours as needed for nausea.   oxyCODONE  5 MG immediate release tablet Commonly known as: Roxicodone  Take 1 tablet (5 mg total) by mouth every 8 (eight) hours as needed for breakthrough pain.   rosuvastatin  20 MG tablet Commonly known as: CRESTOR  TAKE ONE TABLET (20 MG TOTAL) BY MOUTH DAILY.   silver sulfADIAZINE 1 % cream Commonly known as: Silvadene Apply 1 Application topically daily.   tamsulosin  0.4 MG Caps capsule Commonly known as: FLOMAX  TAKE ONE CAPSULE (0.4 MG TOTAL) BY MOUTH DAILY.   timolol  0.5 % ophthalmic solution Commonly known as: TIMOPTIC  Place 1 drop into both eyes daily.   traMADol  50 MG tablet Commonly known as: ULTRAM  Take 1 tablet (50 mg total) by mouth every 6 (six) hours as needed for  moderate pain (pain score 4-6).               Durable Medical Equipment  (From admission, onward)           Start     Ordered   09/24/24 1837  For home use only DME Walker rolling  Once       Question Answer Comment  Walker: With 5 Inch Wheels   Patient needs a walker to treat with the following condition Total knee replacement status      09/24/24 1837   09/24/24 1837  For home use only DME 3 n 1  Once        09/24/24 1837            Diagnostic Studies: No results found.  Disposition:      Follow-up Information     Charlene Debby BROCKS, PA-C Follow up in 2 week(s).   Specialties: Orthopedic Surgery, Emergency Medicine Contact information: [address] [place] [address] [phone]                  Signed: Debby BROCKS Charlene 09/24/2024, 6:39 PM

## 2024-09-24 NOTE — Discharge Summary (Cosign Needed Addendum)
 Physician Discharge Summary  Patient ID: Brett Bender MRN: [id number] DOB/AGE: 1946/04/14 78 y.o.  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]   Discharge Diagnoses: Patient Active Problem List   Diagnosis Date Noted   S/P TKR (total knee replacement), left 09/24/2024   Adrenal adenoma, left 03/16/2024   Aortic atherosclerosis 03/16/2024   Atherosclerosis of native coronary artery of native heart without angina pectoris 03/16/2023   History of sepsis 02/23/2023   Elevated glucose 02/23/2023   Proteinuria 02/23/2023   Elevated lipids 02/23/2023   Compulsive skin picking 02/23/2023   Right flank pain 02/08/2023   Spleen enlarged 07/02/2019   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   Depression 03/16/2016   Nocturia 02/11/2016   Hydronephrosis of left kidney 12/08/2015   History of nephrolithiasis 11/24/2015   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   Basal cell carcinoma of skin 11/18/2015   Neck pain 11/18/2015   Insomnia 05/01/2015   BPH (benign prostatic hyperplasia) 12/14/2012   History of shingles 05/08/2009   Lumbar radiculopathy 12/03/2008   Lumbosacral neuritis 12/03/2008   Essential (primary) hypertension 11/09/1999    Past Medical History:  Diagnosis Date   Actinic keratosis    Arthritis    Basal cell carcinoma 11/09/2013   R lateral edge of clavicle   Basal cell carcinoma 02/03/2016   L lateral supraclavicular base of neck/excision   Basal cell carcinoma 11/29/2018   L upper back   Basal cell carcinoma 09/11/2020   R mid back    Basal cell carcinoma 03/17/2023   right inferior post auricular, excised 04/19/23   Basal cell carcinoma 09/15/2023   Left Superior Posterior Shoulder, EDC   Cancer (HCC)    Basal cell carcinoma bilateral arms and left shoulder   Complication of anesthesia    slow to wake up with shoulder surgery    Depression    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/03/2023   Enlarged prostate    Epidural abscess 03/02/2023   Epidural abscess    H/O elbow surgery    left   Herpes simplex    History of kidney stones    History of MRSA infection 2004   skin around eye   Hypertension    Kidney stone    Lumbar stenosis    Migraine headache    Motion sickness    ocean boats   MSSA bacteremia 02/09/2023   Overdose opiate, accidental or unintentional, initial encounter (HCC) 02/08/2023   PONV (postoperative nausea and vomiting)    Primary osteoarthritis of left knee    Tendonitis of ankle, left      Transfusion: none   Consultants (if any):   Discharged Condition: Improved  Hospital Course: Brett Bender is an 78 y.o. male who was admitted 09/24/2024 with a diagnosis of S/P TKR (total knee replacement), left and went to the operating room on 09/24/2024 and underwent the above named procedures.    Surgeries: Procedure(s): ARTHROPLASTY, KNEE, TOTAL on 09/24/2024 Patient tolerated the surgery well. Taken to PACU where she was stabilized and then transferred to the orthopedic floor.  Started on Lovenox  30 mg q 12 hrs. TEDs and SCDs applied bilaterally. Heels elevated on bed. No evidence of DVT. Negative Homan. Physical therapy started on day #1 for gait training and transfer. OT started day #1 for ADL and assisted devices.  Patient's IV was d/c on day #1. Patient was able to safely and independently complete all PT  goals. PT recommending discharge to home.    On post op day #1 patient was stable and ready for discharge to home with HHPT.  Implants: Femur: Persona Size 11 CR PPS   Tibia: Persona Size H OsseoTi  Poly: 11mm MC  Patella: 35x9.40mm symmetric    He was given perioperative antibiotics:  Anti-infectives (From admission, onward)    Start     Dose/Rate Route Frequency Ordered Stop   09/24/24 2000  ceFAZolin  (ANCEF ) IVPB 2g/100 mL premix        2 g 200 mL/hr over 30 Minutes Intravenous  Every 6 hours 09/24/24 1658 09/25/24 0759   09/24/24 1145  ceFAZolin  (ANCEF ) IVPB 2g/100 mL premix        2 g 200 mL/hr over 30 Minutes Intravenous On call to O.R. 09/24/24 1131 09/24/24 1358   09/24/24 0000  cephALEXin (KEFLEX) 500 MG capsule        500 mg Oral 4 times daily 09/24/24 1839 10/01/24 2359     .  He was given sequential compression devices, early ambulation, and Lovenox  TEDs for DVT prophylaxis.  He benefited maximally from the hospital stay and there were no complications.    Recent vital signs:  Vitals:   09/24/24 1730 09/24/24 1745  BP: 137/74 136/82  Pulse: 75 76  Resp: 12 (!) 26  Temp:    SpO2: 95% 93%    Recent laboratory studies:  Lab Results  Component Value Date   HGB 15.8 09/17/2024   HGB 15.6 03/16/2024   HGB 12.1 (L) 03/08/2023   Lab Results  Component Value Date   WBC 9.9 09/17/2024   PLT 255 09/17/2024   Lab Results  Component Value Date   INR 1.1 03/02/2023   Lab Results  Component Value Date   NA 140 09/17/2024   K 3.9 09/17/2024   CL 101 09/17/2024   CO2 26 09/17/2024   BUN 27 (H) 09/17/2024   CREATININE 1.04 09/17/2024   GLUCOSE 101 (H) 09/17/2024    Discharge Medications:   Allergies as of 09/24/2024       Reactions   Penicillins Other (See Comments)   Stomach Upset        Medication List     STOP taking these medications    diclofenac  50 MG EC tablet Commonly known as: VOLTAREN        TAKE these medications    acetaminophen  500 MG tablet Commonly known as: TYLENOL  Take 2 tablets (1,000 mg total) by mouth every 8 (eight) hours.   amLODipine  10 MG tablet Commonly known as: NORVASC  TAKE ONE TABLET BY MOUTH ONCE A DAY   aspirin  81 MG chewable tablet Chew by mouth daily.   atenolol -chlorthalidone  50-25 MG tablet Commonly known as: TENORETIC  TAKE ONE HALF (1/2) TABLET BY MOUTH ONCE DAILY   celecoxib 200 MG capsule Commonly known as: CeleBREX Take 1 capsule (200 mg total) by mouth 2 (two) times daily  for 14 days.   cephALEXin 500 MG capsule Commonly known as: KEFLEX Take 1 capsule (500 mg total) by mouth 4 (four) times daily for 7 days.   diclofenac  Sodium 1 % Gel Commonly known as: VOLTAREN  APPLY FOUR GRAMS TOPICALLY FIVE TIMES DAILY   docusate sodium  100 MG capsule Commonly known as: COLACE Take 1 capsule (100 mg total) by mouth 2 (two) times daily.   enoxaparin  40 MG/0.4ML injection Commonly known as: LOVENOX  Inject 0.4 mLs (40 mg total) into the skin daily for 14 days.   finasteride  5 MG tablet  Commonly known as: PROSCAR  TAKE ONE TABLET (5 MG TOTAL) BY MOUTH DAILY.   ketoconazole  2 % shampoo Commonly known as: NIZORAL  3 times weekly lather on scalp, leave on 5-8 minutes, rinse well.   latanoprost  0.005 % ophthalmic solution Commonly known as: XALATAN  Place 1 drop into both eyes at bedtime.   Metamucil Fiber + Probiotics Chew Chew 1 Piece by mouth in the morning, at noon, and at bedtime.   mirtazapine  30 MG tablet Commonly known as: REMERON  TAKE ONE TABLET BY MOUTH EVERY NIGHT AT BEDTIME   multivitamin tablet Take 1 tablet by mouth daily.   ondansetron  4 MG tablet Commonly known as: ZOFRAN  Take 1 tablet (4 mg total) by mouth every 6 (six) hours as needed for nausea.   oxyCODONE  5 MG immediate release tablet Commonly known as: Roxicodone  Take 1 tablet (5 mg total) by mouth every 8 (eight) hours as needed for breakthrough pain.   rosuvastatin  20 MG tablet Commonly known as: CRESTOR  TAKE ONE TABLET (20 MG TOTAL) BY MOUTH DAILY.   silver sulfADIAZINE 1 % cream Commonly known as: Silvadene Apply 1 Application topically daily.   tamsulosin  0.4 MG Caps capsule Commonly known as: FLOMAX  TAKE ONE CAPSULE (0.4 MG TOTAL) BY MOUTH DAILY.   timolol  0.5 % ophthalmic solution Commonly known as: TIMOPTIC  Place 1 drop into both eyes daily.   traMADol  50 MG tablet Commonly known as: ULTRAM  Take 1 tablet (50 mg total) by mouth every 6 (six) hours as needed for  moderate pain (pain score 4-6).               Durable Medical Equipment  (From admission, onward)           Start     Ordered   09/24/24 1837  For home use only DME Walker rolling  Once       Question Answer Comment  Walker: With 5 Inch Wheels   Patient needs a walker to treat with the following condition Total knee replacement status      09/24/24 1837   09/24/24 1837  For home use only DME 3 n 1  Once        09/24/24 1837            Diagnostic Studies: No results found.  Disposition:      Follow-up Information     Charlene Debby BROCKS, PA-C Follow up in 2 week(s).   Specialties: Orthopedic Surgery, Emergency Medicine Contact information: [address] [place] [address] [phone]                  Signed: Debby BROCKS Charlene 09/24/2024, 6:39 PM

## 2024-09-24 NOTE — Op Note (Signed)
 Patient Name: Dragan Tamburrino  FMW:[id number]  Pre-Operative Diagnosis: Left knee Osteoarthritis  Post-Operative Diagnosis: (same)  Procedure: Left Total Knee Arthroplasty  Components/Implants: Femur: Persona Size 11 CR PPS   Tibia: Persona Size H OsseoTi  Poly: 11mm MC  Patella: 35x9.50mm symmetric  Femoral Valgus Cut Angle: 5 degrees  Distal Femoral Re-cut: none  Patella Resurfacing: yes   Date of Surgery: 09/24/2024  Surgeon: Arthea Sheer MD  Assistant: Mitzie RNFA (present and scrubbed throughout the case, critical for assistance with exposure, retraction, instrumentation, and closure)   Anesthesiologist: Piscitello  Anesthesia: General  Tourniquet Time: 50 min  EBL: 25cc  IVF: 500cc  Complications: None   Brief history: The patient is a 78 year old male with a history of osteoarthritis of the left knee with pain limiting their range of motion and activities of daily living, which has failed multiple attempts at conservative therapy.  The risks and benefits of total knee arthroplasty as definitive surgical treatment were discussed with the patient, who opted to proceed with the operation.  After outpatient medical clearance and optimization was completed the patient was [facility] procedure.  All preoperative films were reviewed and an appropriate surgical plan was made prior to surgery. Preoperative range of motion was 0 to 110.   Description of procedure: The patient was brought to the operating room where laterality was confirmed by all those present to be the left side.   Spinal anesthesia was administered and the patient received an intravenous dose of antibiotics for surgical prophylaxis and a dose of tranexamic acid.  Patient is positioned supine on the operating room table with all bony prominences well-padded.  A well-padded tourniquet was applied to the left thigh.  The knee was then prepped and draped in usual sterile  fashion with multiple layers of adhesive and nonadhesive drapes.  All of those present in the operating room participated in a surgical timeout laterality and patient were confirmed.   An Esmarch was wrapped around the extremity and the leg was elevated and the knee flexed.  The tourniquet was inflated to a pressure of 250 mmHg. The Esmarch was removed and the leg was brought down to full extension.  The patella and tibial tubercle identified and outlined using a marking pen and a midline skin incision was made with a knife carried through the subcutaneous tissue down to the extensor retinaculum.  After exposure of the extensor mechanism the medial parapatellar arthrotomy was performed with a scalpel and electrocautery extending down medial and distal to the tibial tubercle taking care to avoid incising the patellar tendon.   A standard medial release was performed over the proximal tibia.  The knee was brought into extension in order to excise the fat pad taking care not to damage the patella tendon.  The superior soft tissue was removed from the anterior surface of the distal femur to visualize for the procedure.  The knee was then brought into flexion with the patella subluxed laterally and subluxing the tibia anteriorly.  The ACL was transected and removed with electrocautery and additional soft tissue was removed from the proximal surface of the tibia to fully expose. The PCL was found to be intact and was preserved.  An extramedullary tibial cutting guide was then applied to the leg with a spring-loaded ankle clamp placed around the distal tibia just above the malleoli the angulation of the guide was adjusted to give some posterior slope in the tibial resection with an appropriate varus/valgus alignment.  The resection guide was then pinned to the proximal tibia and the proximal tibial surface was resected with an oscillating saw.  Careful attention was paid to ensure the blade did not disrupt any of the  soft tissues including any lateral or medial ligament.  Attention was then turned to the femur, with the knee slightly flexed a opening drill was used to enter the medullary canal of the femur.  After removing the drill marrow was suctioned out to decompress the distal femur.  An intramedullary femoral guide was then inserted into the drill hole and the alignment guide was seated firmly against the distal end of the medial femoral condyle.  The distal femoral cutting guide was then attached and pinned securely to the anterior surface of the femur and the intramedullary rod and alignment guide was removed.  Distal femur resection was then performed with an oscillating saw with retractors protecting medial and laterally.   The distal cutting block was then removed and the extension gap was checked with a spacer.  Extension gap was found to be appropriately sized to accommodate the spacer block.   The femoral sizing guide was then placed securely into the posterior condyles of the femur and the femoral size was measured and determined to be 11.  The size 11; 4-in-1 cutting guide was placed in position and secured with 2 pins.  The anterior posterior and chamfer resections were then performed with an oscillating saw.  Bony fragments and osteophytes were then removed.  Using a lamina spreader the posterior medial and lateral condyles were checked for additional osteophytes and posterior soft tissue remnants.  Any remaining meniscus was removed at this time.  Periarticular injection was performed in the meniscal rims and posterior capsule with aspiration performed to ensure no intravascular injection.   The tibia was then exposed and the tibial trial was pinned onto the plateau after confirming appropriate orientation and rotation.  Using the drill bushing the tibia was prepared to the appropriate drill depth.  Tibial broach impactor was then driven through the punch guide using a mallet.  The femoral trial  component was then inserted onto the femur. A trial tibial polyethylene bearing was then placed and the knee was reduced.  The knee achieved full extension with no hyperextension and was found to be balanced in flexion and extension with the trials in place.  The knee was then brought into full extension the patella was everted and held with 2 Kocher clamps.  The articular surface of the patella was then resected with an patella reamer and saw after careful measurement with a caliper.  The patella was then prepared with the drill guide and a trial patella was placed.  The knee was then taken through range of motion and it was found that the patella articulated appropriately with the trochlea and good patellofemoral motion without subluxation.    The correct final components for implantation were confirmed and opened by the circulator nurse.  The knee was irrigated with normal saline via pulsatile lavage to remove any bony debris or soft tissue.  The prepared surface of the tibia was exposed and the tibial component was implanted with good bony contact.  The femoral component was then placed and impacted showing good coverage and a good snug fit.  The patella was then cleared off and the patella compression tool was used to apply the patellar component with symmetric compression onto the patella.  The tibial component was then irrigated and cleared of any debris and  a real polyethylene component was placed and engaged with the locking mechanism.  The knee was then injected with the particular cocktail.  The knee was taken through range of motion and found to be stable in flexion and extension with patellar tracking.  The knee was then irrigated with copious amount of normal saline via pulsatile lavage to remove all loose bodies and other debris.  The knee was then irrigated with surgiphor betadine based wash and reirrigated with saline.  The tourniquet was then dropped and all bleeding vessels were identified and  coagulated.  The arthrotomy was approximated with #1 Vicryl and closed with #1 Stratafix suture.  The knee was brought into slight flexion and the subcutaneous tissues were closed with 0 Vicryl, 2-0 Vicryl and a running subcuticular 4-0 stratafix barbed suture.  Skin was then glued with Dermabond.  A sterile adhesive dressing was then placed along with a sequential compression device to the calf, a Ted stocking, and a cryotherapy cuff.   Sponge, needle, and Lap counts were all correct at the end of the case.   The patient was transferred off of the operating room table to a hospital bed, good pulses were found distally on the operative side.  The patient was transferred to the recovery room in stable condition.

## 2024-09-24 NOTE — Plan of Care (Signed)
  Problem: Clinical Measurements: Goal: Postoperative complications will be avoided or minimized Outcome: Progressing   

## 2024-09-24 NOTE — Anesthesia Preprocedure Evaluation (Addendum)
 Anesthesia Evaluation  Patient identified by MRN, date of birth, ID band Patient awake    Reviewed: Allergy & Precautions, NPO status , Patient's Chart, lab work & pertinent test results  History of Anesthesia Complications (+) Emergence Delirium and history of anesthetic complications  Airway Mallampati: III  TM Distance: >3 FB Neck ROM: full    Dental  (+) Chipped   Pulmonary neg pulmonary ROS, neg shortness of breath   Pulmonary exam normal        Cardiovascular Exercise Tolerance: Good hypertension, (-) angina + CAD  (-) Past MI Normal cardiovascular exam     Neuro/Psych  Headaches  Neuromuscular disease    GI/Hepatic negative GI ROS, Neg liver ROS,neg GERD  ,,  Endo/Other  negative endocrine ROS    Renal/GU Renal disease     Musculoskeletal   Abdominal   Peds  Hematology negative hematology ROS (+)   Anesthesia Other Findings Past Medical History: No date: Actinic keratosis No date: Arthritis 11/09/2013: Basal cell carcinoma     Comment:  R lateral edge of clavicle 02/03/2016: Basal cell carcinoma     Comment:  L lateral supraclavicular base of neck/excision 11/29/2018: Basal cell carcinoma     Comment:  L upper back 09/11/2020: Basal cell carcinoma     Comment:  R mid back  03/17/2023: Basal cell carcinoma     Comment:  right inferior post auricular, excised 04/19/23 09/15/2023: Basal cell carcinoma     Comment:  Left Superior Posterior Shoulder, EDC No date: Cancer (HCC)     Comment:  Basal cell carcinoma bilateral arms and left shoulder No date: Complication of anesthesia     Comment:  slow to wake up with shoulder surgery No date: Depression 03/03/2023: Discitis of lumbar region No date: 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/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 No date: H/O elbow surgery     Comment:  left No date: Herpes simplex No date: History of kidney stones 2004: History of MRSA  infection     Comment:  skin around eye No date: Hypertension No date: Kidney stone No date: Lumbar stenosis No date: Migraine headache No date: Motion sickness     Comment:  ocean boats 02/09/2023: MSSA bacteremia 02/08/2023: Overdose opiate, accidental or unintentional, initial  encounter (HCC) No date: PONV (postoperative nausea and vomiting) No date: Primary osteoarthritis of left knee No date: Tendonitis of ankle, left  Past Surgical History: No date: APPENDECTOMY No date: back injection No date: BACK SURGERY 12/18/2015: CYSTOSCOPY/URETEROSCOPY/HOLMIUM LASER/STENT PLACEMENT; Left     Comment:  Procedure: CYSTOSCOPY/URETEROSCOPY/HOLMIUM LASER               LITHOTRIPSY;  Surgeon: Rosina Riis, MD;  Location:               [facility] ORS;  Service: Urology;  Laterality: Left; No date: ELBOW SURGERY 03/03/2023: IR LUMBAR DISC ASPIRATION W/IMG GUIDE 2010: LOWER BACK SURGERY     Comment:  L4-5 fusion 08/21/2018: LUMBAR LAMINECTOMY/DECOMPRESSION MICRODISCECTOMY; N/A     Comment:  Procedure: LUMBAR LAMINECTOMY/DECOMPRESSION               MICRODISCECTOMY 1 LEVEL-L3/4;  Surgeon: Bluford Standing, MD;              Location: [facility] ORS;  Service: Neurosurgery;  Laterality:               N/A; No date: NECK SURGERY     Comment:  Upper neck surgery; with wires placed 11/10/2011: SHOULDER SURGERY; Right  Comment:  arthroscopic surgery . Dr. Claudene, [facility] 02/11/2023: TEE WITHOUT CARDIOVERSION; N/A     Comment:  Procedure: TRANSESOPHAGEAL ECHOCARDIOGRAM;  Surgeon:               Darliss Rogue, MD;  Location: [facility] ORS;  Service:               Cardiovascular;  Laterality: N/A; No date: TONSILLECTOMY 12/04/2019: TRICEPS TENDON REPAIR; Left     Comment:  Procedure: TRICEPS TENDON REPAIR, OLECRANON BURSA;                Surgeon: Tobie Priest, MD;  Location: [facility]               [facility];  Service: Orthopedics;  Laterality: Left;  BMI    Body Mass Index: 26.38 kg/m       Reproductive/Obstetrics negative OB ROS                              Anesthesia Physical Anesthesia Plan  ASA: 2  Anesthesia Plan: General ETT   Post-op Pain Management:    Induction: Intravenous  PONV Risk Score and Plan: Ondansetron , Dexamethasone , Midazolam  and Treatment may vary due to age or medical condition  Airway Management Planned: Oral ETT  Additional Equipment:   Intra-op Plan:   Post-operative Plan: Extubation in OR  Informed Consent: I have reviewed the patients History and Physical, chart, labs and discussed the procedure including the risks, benefits and alternatives for the proposed anesthesia with the patient or authorized representative who has indicated his/her understanding and acceptance.     Dental Advisory Given  Plan Discussed with: Anesthesiologist, CRNA and Surgeon  Anesthesia Plan Comments: (Plan to avoid spinal 2/2 patients lumbar history, that he feels that he is still have symptoms from his epidural abscess   Patient consented for risks of anesthesia including but not limited to:  - adverse reactions to medications - damage to eyes, teeth, lips or other oral mucosa - nerve damage due to positioning  - sore throat or hoarseness - Damage to heart, brain, nerves, lungs, other parts of body or loss of life  Patient voiced understanding and assent.)         Anesthesia Quick Evaluation

## 2024-09-24 NOTE — Transfer of Care (Signed)
 Immediate Anesthesia Transfer of Care Note  Patient: Brett Bender  Procedure(s) Performed: ARTHROPLASTY, KNEE, TOTAL (Left: Knee)  Patient Location: PACU  Anesthesia Type:General  Level of Consciousness: awake, alert , and drowsy  Airway & Oxygen Therapy: Patient Spontanous Breathing and Patient connected to face mask oxygen  Post-op Assessment: Report given to RN and Post -op Vital signs reviewed and stable  Post vital signs: Reviewed and stable  Last Vitals:  Vitals Value Taken Time  BP 144/85 09/24/24 15:39  Temp 36.1 1539  Pulse 66 09/24/24 15:42  Resp 18 09/24/24 15:42  SpO2 95 % 09/24/24 15:42  Vitals shown include unfiled device data.  Last Pain:  Vitals:   09/24/24 1155  PainSc: 7          Complications: No notable events documented.

## 2024-09-24 NOTE — H&P (Signed)
 History of Present Illness: The patient is an 78 y.o. male seen in clinic today for follow-up of relation of his left knee prior to planned left total knee replacement. Patient has ongoing severe pain in the medial and anterior aspect of his knee and has had it for several years. Began worsening almost a year ago and has undergone treatments with steroid injections hyaluronic acid viscosupplementation with only short-term relief. He has undergone activity modification home exercise program and wears knee braces. Despite these interventions along with pain medications and anti-inflammatories he has not had significant improvement in pain or stiffness and is functioning limited on a daily basis due to his left knee. He has 10 out of 10 pain and is here today to discuss more permanent solution. He does have a history of thoracolumbar osteomyelitis which was treated and is considered to be stable with no ongoing infection. He has never had an infection in his lower extremity. No history of blood clot. The patient denies fevers, chills, numbness, tingling, shortness of breath, chest pain, recent illness, or any trauma.  Patient is a non-smoker with a BMI of 25.4 and hemoglobin A1c of 5.9  Past Medical History: Past Medical History:  Diagnosis Date  Depression  Enlarged prostate  Hypertension  Migraine headache   Past Surgical History: Past Surgical History:  Procedure Laterality Date  L3-4 laminectomy & decompression 08/21/2018  Dr Elspeth Ahle at [facility]  left triceps repair, left olecranon bursectomy Left 12/04/2019  Dr. Tobie  APPENDECTOMY  [facility] Neurosurgery Dr Gaither  Removal of kindey stone  right shoulder surgery  TONSILLECTOMY   Past Family History: History reviewed. No pertinent family history.  Medications: Current Outpatient Medications  Medication Sig Dispense Refill  ALPRAZolam  (XANAX ) 0.5 MG tablet Take 0.5 mg by mouth  amLODIPine  (NORVASC ) 2.5 MG tablet Take 1  tablet by mouth every evening  aspirin  81 MG chewable tablet Take 81 mg by mouth once daily  atenolol -chlorthalidone  (TENORETIC ) 50-25 mg tablet TAKE 1/2 TABLET BY MOUTH EVERY DAY  butalbital -acetaminophen -caffeine  (FIORICET ) 50-325-40 mg tablet Take 1-2 tablets by mouth every 6 (six) hours as needed  cefadroxil  (DURICEF) 500 mg capsule Take by mouth as directed (Patient not taking: Reported on 02/15/2024)  cyclobenzaprine  (FLEXERIL ) 5 MG tablet Take 1-2 tablets by mouth nightly as needed  diclofenac  (VOLTAREN ) 1 % topical gel Apply topically  diclofenac  (VOLTAREN ) 50 MG EC tablet Take 1 tablet by mouth 2 (two) times daily  diclofenac  potassium (CATAFLAM) 50 mg tablet Take 1 tablet by mouth 2 (two) times daily  finasteride  (PROSCAR ) 5 mg tablet Take 5 mg by mouth once daily  fluticasone  propionate (FLONASE ) 50 mcg/actuation nasal spray Place 2 sprays into both nostrils once daily as needed for Rhinitis or Allergies  gabapentin  (NEURONTIN ) 300 MG capsule Take 300 mg by mouth at bedtime  HYDROcodone -acetaminophen  (NORCO) 10-325 mg tablet Take by mouth Take 1 tablet by mouth every 6 (six) hours as needed.  latanoprost  (XALATAN ) 0.005 % ophthalmic solution  methocarbamol  (ROBAXIN ) 500 MG tablet Take 1 tablet by mouth every 6 (six) hours as needed  mirtazapine  (REMERON ) 30 MG tablet Take by mouth.  multivit-min/folic/vit K/lycop (ONE-A-DAY MEN'S 50 PLUS ORAL) Take 1 tablet by mouth once daily  multivitamin (MULTIVITAMIN) tablet Take 1 tablet by mouth once daily  oxyCODONE -acetaminophen  (PERCOCET) 5-325 mg tablet Take 1 tablet by mouth every 4 (four) hours as needed for Pain (Patient not taking: Reported on 02/15/2024)  psyllium, aspartame, (METAMUCIL) 3.4 gram packet Take by mouth  psyllium, with dextrose , (METAMUCIL) powder Take by mouth (Patient not taking: Reported on 02/15/2024)  rosuvastatin  (CRESTOR ) 20 MG tablet Take 20 mg by mouth once daily  tamsulosin  (FLOMAX ) 0.4 mg capsule Take by mouth.   timoloL  maleate (TIMOPTIC ) 0.5 % ophthalmic solution Apply to eye  traMADoL  (ULTRAM ) 50 mg tablet Take 1 tablet by mouth every 4 (four) hours  valACYclovir  (VALTREX ) 500 MG tablet Take 500 mg by mouth once daily  vancomycin  (VANCOCIN ) 125 MG capsule Take 1 capsule (125 mg total) by mouth 2 (two) times daily for 7 days, THEN 1 capsule (125 mg total) daily for 7 days, THEN 1 capsule (125 mg total) every other day for 28 days. (Patient not taking: Reported on 02/15/2024)   No current facility-administered medications for this visit.   Allergies: Allergies  Allergen Reactions  Penicillins Nausea  Other reaction(s): Stomach Ache    Visit Vitals: Vitals:  08/29/24 0906  BP: 126/84    Review of Systems:  A comprehensive 14 point ROS was performed, reviewed, and the pertinent orthopaedic findings are documented in the HPI.  Physical Exam: General/Constitutional: No apparent distress: well-nourished and well developed. Eyes: Pupils equal, round with synchronous movement. Pulmonary exam: Lungs clear to auscultation bilaterally no wheezing rales or rhonchi Cardiac exam: Regular rate and rhythm no obvious murmurs rubs or gallops. Integumentary: No impressive skin lesions present, except as noted in detailed exam. Neuro/Psych: Normal mood and affect, oriented to person, place and time.  Comprehensive Knee Exam: Gait Antalgic gait  Alignment Varus thrust more significant on the left   Inspection Right Left  Skin Normal appearance with no obvious deformity. No ecchymosis or erythema. Normal appearance with no obvious deformity. Healing eschars from prior scratches no sign of infection, no ecchymosis or erythema.  Soft Tissue No focal soft tissue swelling No focal soft tissue swelling  Quad Atrophy None None   Palpation  Right Left  Tenderness Mild medial joint line tenderness palpation Medial joint line tenderness to palpation as well as medial parapatellar tenderness  Crepitus No  patellofemoral or tibiofemoral crepitus + patellofemoral and tibiofemoral crepitus  Effusion None None   Range of Motion Right Left  Flexion 0-120 0-110  Extension Full knee extension without hyperextension Full knee extension without hyperextension   Ligamentous Exam Right Left  Lachman Normal Normal  Valgus 0 Normal Normal  Valgus 30 Normal Normal  Varus 0 Normal Normal  Varus 30 Normal Normal  Anterior Drawer Normal Normal  Posterior Drawer Normal Normal   Meniscal Exam Right Left  Hyperflexion Test Negative Positive  Hyperextension Test Negative Positive  McMurray's Positive Positive   Neurovascular Right Left  Quadriceps Strength 5/5 5/5  Hamstring Strength 5/5 5/5  Hip Abductor Strength 4/5 4/5  Distal Motor Normal Normal  Distal Sensory Normal light touch sensation Normal light touch sensation  Distal Pulses Normal Normal    Imaging Studies: I have reviewed AP, lateral,sunrise, and flexed PA weight bearing knee X-rays (4 views) of the left knee taken at the last office visit show severe degenerative changes with medial bone-on-bone articulation notch osteophytes and patellofemoral degeneration with sclerosis and early subchondral cyst formation. Kellgren-Lawrence grade 3/4. AP, sunrise, and flexed PA of the right knee also show moderate degenerative changes with medial joint space narrowing tibial sclerosis and osteophyte formation as well as patellofemoral narrowing and sclerosis. Kellgren-Lawrence grade 2/3. No fractures or dislocations noted in either knee  Assessment:  ICD-10-CM  1. Primary osteoarthritis of left knee M17.12    Plan: Brett Bender  is a 78 year old male presents with left knee bone on bone arthritis. Based upon the patient's continued symptoms and failure to respond to conservative treatment, I have recommended a left total knee replacement for this patient. A long discussion took place with the patient describing what a total joint replacement is and  what the procedure would entail. A knee model, similar to the implants that will be used during the operation, was utilized to demonstrate the implants. Choices of implant manufactures were discussed and reviewed. The ability to secure the implant utilizing cement or cementless (press fit) fixation was discussed. The approach and exposure was discussed.   The hospitalization and post-operative care and rehabilitation were also discussed. The use of perioperative antibiotics and DVT prophylaxis were discussed. The risk, benefits and alternatives to a surgical intervention were discussed at length with the patient. The patient was also advised of risks related to the medical comorbidities and elevated body mass index (BMI). A lengthy discussion took place to review the most common complications including but not limited to: stiffness, loss of function, complex regional pain syndrome, deep vein thrombosis, pulmonary embolus, heart attack, stroke, infection, wound breakdown, numbness, intraoperative fracture, damage to nerves, tendon,muscles, arteries or other blood vessels, death and other possible complications from anesthesia. The patient was told that we will take steps to minimize these risks by using sterile technique, antibiotics and DVT prophylaxis when appropriate and follow the patient postoperatively in the office setting to monitor progress. The possibility of recurrent pain, no improvement in pain and actual worsening of pain were also discussed with the patient.   Patient asked about and confirms no history of any reactions to metal or metal allergy in the past.  The discharge plan of care focused on the patient going home following surgery. The patient was encouraged to make the necessary arrangements to have someone stay with them when they are discharged home.   The benefits of surgery were discussed with the patient including the potential for improving the patient's current clinical condition  through operative intervention. Alternatives to surgical intervention including continued conservative management were also discussed in detail. All questions were answered to the satisfaction of the patient. The patient participated and agreed to the plan of care as well as the use of the recommended implants for their total knee replacement surgery. An information packet was given to the patient to review prior to surgery.   Patient received medical clearance for surgery. ESR/CRP/CBC were negative for any signs of infection. No wounds over the knee. All questions answered and patient agrees above plan move forward with left total knee replacement. We did discuss extended oral antibiotics after surgery given his history and the patient is ok with this plan.     Portions of this record have been created using Scientist, clinical (histocompatibility and immunogenetics). Dictation errors have been sought, but may not have been identified and corrected.  Arthea Sheer MD

## 2024-09-24 NOTE — Progress Notes (Addendum)
 PT Cancellation Note  Patient Details Name: Brett Bender MRN: [id number] DOB: 1946/04/17   Cancelled Treatment:    Reason Eval/Treat Not Completed: Other (comment) PT orders received, chart reviewed. Per nurse, pt's pain still 8/10 at rest, working on managing pain, pt also nauseous. Will plan to see pt tomorrow AM.  [name], PT, DPT 09/24/24, 4:32 PM   Richerd CHRISTELLA Pinal 09/24/2024, 4:31 PM

## 2024-09-24 NOTE — Progress Notes (Signed)
 Patient is not able to walk the distance required to go the bathroom, or he is unable to safely negotiate stairs required to access the bathroom.  A 3in1 BSC will alleviate this problem.        Lollie Marrow, PA-C [facility]

## 2024-09-24 NOTE — Anesthesia Procedure Notes (Signed)
 Procedure Name: Intubation Date/Time: 09/24/2024 1:39 PM  Performed by: Jackye Spanner, CRNAPre-anesthesia Checklist: Patient identified, Patient being monitored, Timeout performed, Emergency Drugs available and Suction available Patient Re-evaluated:Patient Re-evaluated prior to induction Oxygen Delivery Method: Circle system utilized Preoxygenation: Pre-oxygenation with 100% oxygen Induction Type: IV induction Ventilation: Mask ventilation without difficulty and Oral airway inserted - appropriate to patient size Laryngoscope Size: 3 and McGrath Grade View: Grade I Tube type: Oral Tube size: 7.5 mm Number of attempts: 1 Airway Equipment and Method: Stylet Placement Confirmation: ETT inserted through vocal cords under direct vision, positive ETCO2 and breath sounds checked- equal and bilateral Secured at: 22 cm Tube secured with: Tape Dental Injury: Teeth and Oropharynx as per pre-operative assessment  Comments: Smooth atraumatic intubation, no complications noted.

## 2024-09-24 NOTE — Discharge Instructions (Signed)

## 2024-09-25 ENCOUNTER — Other Ambulatory Visit: Payer: Self-pay

## 2024-09-25 ENCOUNTER — Encounter: Payer: Self-pay | Admitting: Orthopedic Surgery

## 2024-09-25 DIAGNOSIS — Z79899 Other long term (current) drug therapy: Secondary | ICD-10-CM | POA: Diagnosis not present

## 2024-09-25 DIAGNOSIS — M1712 Unilateral primary osteoarthritis, left knee: Secondary | ICD-10-CM | POA: Diagnosis not present

## 2024-09-25 DIAGNOSIS — R519 Headache, unspecified: Secondary | ICD-10-CM | POA: Diagnosis not present

## 2024-09-25 DIAGNOSIS — I10 Essential (primary) hypertension: Secondary | ICD-10-CM | POA: Diagnosis not present

## 2024-09-25 DIAGNOSIS — Z7982 Long term (current) use of aspirin: Secondary | ICD-10-CM | POA: Diagnosis not present

## 2024-09-25 LAB — BASIC METABOLIC PANEL WITH GFR
Anion gap: 10 (ref 5–15)
BUN: 15 mg/dL (ref 8–23)
CO2: 26 mmol/L (ref 22–32)
Calcium: 8.7 mg/dL — ABNORMAL LOW (ref 8.9–10.3)
Chloride: 101 mmol/L (ref 98–111)
Creatinine, Ser: 0.84 mg/dL (ref 0.61–1.24)
GFR, Estimated: >60 mL/min (ref >60)
Glucose, Bld: 149 mg/dL — ABNORMAL HIGH (ref 70–99)
Potassium: 3.9 mmol/L (ref 3.5–5.1)
Sodium: 137 mmol/L (ref 135–145)

## 2024-09-25 LAB — CBC
HCT: 42.6 % (ref 39.0–52.0)
Hemoglobin: 14.5 g/dL (ref 13.0–17.0)
MCH: 30.2 pg (ref 26.0–34.0)
MCHC: 34 g/dL (ref 30.0–36.0)
MCV: 88.8 fL (ref 80.0–100.0)
Platelets: 236 K/uL (ref 150–400)
RBC: 4.8 MIL/uL (ref 4.22–5.81)
RDW: 13.3 % (ref 11.5–15.5)
WBC: 15.9 K/uL — ABNORMAL HIGH (ref 4.0–10.5)
nRBC: 0 % (ref 0.0–0.2)

## 2024-09-25 MED ORDER — MUPIROCIN 2 % EX OINT
TOPICAL_OINTMENT | CUTANEOUS | 0 refills | Status: AC
  Filled 2024-09-25: qty 22, 30d supply, fill #0

## 2024-09-25 NOTE — Progress Notes (Signed)
 Subjective: 1 Day Post-Op Procedure(s) (LRB): ARTHROPLASTY, KNEE, TOTAL (Left) Patient reports pain as mild.   Patient is well, and has had no acute complaints or problems Denies any CP, SOB, ABD pain. We will continue therapy today.  Plan is to go Home after hospital stay.  Objective: Vital signs in last 24 hours: Temp:  [96.4 F (35.8 C)-98.1 F (36.7 C)] 98.1 F (36.7 C) (11/18 0748) Pulse Rate:  [56-76] 62 (11/18 0748) Resp:  [0-26] 15 (11/18 0748) BP: (122-144)/(67-87) 126/67 (11/18 0748) SpO2:  [90 %-99 %] 92 % (11/18 0748) Weight:  [97 kg] 97 kg (11/17 1155)  Intake/Output from previous day: 11/17 0701 - 11/18 0700 In: 2219 [P.O.:360; I.V.:1259; IV Piggyback:600] Out: 730 [Urine:705; Blood:25] Intake/Output this shift: Total I/O In: -  Out: 250 [Urine:250]  Recent Labs    09/25/24 0615  HGB 14.5   Recent Labs    09/25/24 0615  WBC 15.9*  RBC 4.80  HCT 42.6  PLT 236   Recent Labs    09/25/24 0615  NA 137  K 3.9  CL 101  CO2 26  BUN 15  CREATININE 0.84  GLUCOSE 149*  CALCIUM  8.7*   No results for input(s): LABPT, INR in the last 72 hours.  EXAM General - Patient is Alert, Appropriate, and Oriented Extremity - Neurovascular intact Sensation intact distally Intact pulses distally Dorsiflexion/Plantar flexion intact No cellulitis present Compartment soft Dressing - dressing C/D/I and no drainage Motor Function - intact, moving foot and toes well on exam.   Past Medical History:  Diagnosis Date   Actinic keratosis    Arthritis    Basal cell carcinoma 11/09/2013   R lateral edge of clavicle   Basal cell carcinoma 02/03/2016   L lateral supraclavicular base of neck/excision   Basal cell carcinoma 11/29/2018   L upper back   Basal cell carcinoma 09/11/2020   R mid back    Basal cell carcinoma 03/17/2023   right inferior post auricular, excised 04/19/23   Basal cell carcinoma 09/15/2023   Left Superior Posterior Shoulder, EDC    Cancer (HCC)    Basal cell carcinoma bilateral arms and left shoulder   Complication of anesthesia    slow to wake up with shoulder surgery   Depression    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/03/2023   Enlarged prostate    Epidural abscess 03/02/2023   Epidural abscess    H/O elbow surgery    left   Herpes simplex    History of kidney stones    History of MRSA infection 2004   skin around eye   Hypertension    Kidney stone    Lumbar stenosis    Migraine headache    Motion sickness    ocean boats   MSSA bacteremia 02/09/2023   Overdose opiate, accidental or unintentional, initial encounter (HCC) 02/08/2023   PONV (postoperative nausea and vomiting)    Primary osteoarthritis of left knee    Tendonitis of ankle, left     Assessment/Plan:   1 Day Post-Op Procedure(s) (LRB): ARTHROPLASTY, KNEE, TOTAL (Left) Principal Problem:   S/P TKR (total knee replacement), left  Estimated body mass index is 26.38 kg/m as calculated from the following:   Height as of this encounter: 6' 3.5 (1.918 m).   Weight as of this encounter: 97 kg. Advance diet Up with therapy Pain well controlled Labs and VSS CM to assist with discharge to home with HHPT today  DVT Prophylaxis - Lovenox , TED hose, and  SCDs Weight-Bearing as tolerated to left leg   T. Medford Amber, PA-C [facility] 09/25/2024, 8:20 AM

## 2024-09-25 NOTE — Plan of Care (Signed)

## 2024-09-25 NOTE — Plan of Care (Signed)
  Problem: Activity: Goal: Ability to avoid complications of mobility impairment will improve Outcome: Progressing Goal: Range of joint motion will improve Outcome: Progressing   Problem: Pain Management: Goal: Pain level will decrease with appropriate interventions Outcome: Progressing   

## 2024-09-25 NOTE — Progress Notes (Signed)
 DISCHARGE NOTE:  Pt dc with IV removed and dc instructions given. Pt has both TED hose on and in place. Pt received 3 in 1 and medications delivered to hospital room. Pt educated on how to administer Lovenox  injection and given a sharps container for home use. Pt voices no questions or concerns at this time. Pt wheeled down to medial mall entrance by staff. Pt's wife provided transportation.

## 2024-09-25 NOTE — Anesthesia Postprocedure Evaluation (Signed)
 Anesthesia Post Note  Patient: Brett Bender  Procedure(s) Performed: ARTHROPLASTY, KNEE, TOTAL (Left: Knee)  Patient location during evaluation: PACU Anesthesia Type: General Level of consciousness: awake and alert Pain management: pain level controlled Vital Signs Assessment: post-procedure vital signs reviewed and stable Respiratory status: spontaneous breathing, nonlabored ventilation and respiratory function stable Cardiovascular status: blood pressure returned to baseline and stable Postop Assessment: no apparent nausea or vomiting Anesthetic complications: no   No notable events documented.   Last Vitals:  Vitals:   09/24/24 2324 09/25/24 0447  BP: 122/72 129/70  Pulse: (!) 59 (!) 56  Resp: 14 16  Temp:  (!) 36.3 C  SpO2: 93% 94%    Last Pain:  Vitals:   09/25/24 0612  TempSrc:   PainSc: 5                  Fairy POUR Romesha Scherer

## 2024-09-25 NOTE — Evaluation (Signed)
 Physical Therapy Evaluation Patient Details Name: Brett Bender MRN: [id number] DOB: 1946-11-01 Today's Date: 09/25/2024  History of Present Illness  78 y/o male s/p L TKA on 09/24/24. PMH: HTN, depression, migraine  Clinical Impression  Patient admitted following the above procedure. PTA, patient lives with wife and was independent with intermittent use of SPC for community mobility. Patient demonstrates impaired L knee ROM, weakness, and pain. Able to complete bed mobility modI, Stood and ambulated 250' with supervision and RW with cues for heel strike on L to emphasize knee extension. Education provided on HEP handout, limb positioning, and importance of mobility, patient verbalized understanding. Patient will benefit from skilled PT services during acute stay to address listed deficits. Patient will benefit from ongoing therapy at discharge to maximize functional independence and safety.         If plan is discharge home, recommend the following: A little help with bathing/dressing/bathroom;Assistance with cooking/housework;Assist for transportation;Help with stairs or ramp for entrance   Can travel by private vehicle        Equipment Recommendations BSC/3in1  Recommendations for Other Services       Functional Status Assessment Patient has had a recent decline in their functional status and demonstrates the ability to make significant improvements in function in a reasonable and predictable amount of time.     Precautions / Restrictions Precautions Precautions: Fall;Knee Precaution Booklet Issued: Yes (comment) Recall of Precautions/Restrictions: Intact Restrictions Weight Bearing Restrictions Per Provider Order: Yes LLE Weight Bearing Per Provider Order: Weight bearing as tolerated      Mobility  Bed Mobility Overal bed mobility: Modified Independent                  Transfers Overall transfer level: Needs assistance Equipment used: Rolling Aldon Hengst (2  wheels) Transfers: Sit to/from Stand Sit to Stand: Supervision                Ambulation/Gait Ambulation/Gait assistance: Supervision Gait Distance (Feet): 250 Feet Assistive device: Rolling Angely Dietz (2 wheels) Gait Pattern/deviations: Step-to pattern, Decreased stride length Gait velocity: decreased     General Gait Details: cues for heel strike on L. Supervision for safety. Initially step to pattern with slow progression to short step through pattern  Stairs Stairs: Yes Stairs assistance: Supervision Stair Management: One rail Left, Step to pattern, Forwards (HHA on R) Number of Stairs: 4 General stair comments: step to pattern with instruction on up with good LE and down with surgical LE  Wheelchair Mobility     Tilt Bed    Modified Rankin (Stroke Patients Only)       Balance Overall balance assessment: Mild deficits observed, not formally tested                                           Pertinent Vitals/Pain Pain Assessment Pain Assessment: Faces Faces Pain Scale: Hurts even more Pain Location: L knee Pain Descriptors / Indicators: Grimacing, Guarding Pain Intervention(s): Limited activity within patient's tolerance, Monitored during session, Repositioned    Home Living Family/patient expects to be discharged to:: Private residence Living Arrangements: Spouse/significant other Available Help at Discharge: Family;Available 24 hours/day Type of Home: House Home Access: Stairs to enter Entrance Stairs-Rails: Left Entrance Stairs-Number of Steps: 6   Home Layout: One level Home Equipment: Agricultural Consultant (2 wheels);Cane - single point;Shower seat      Prior Function Prior Level of  Function : Independent/Modified Independent;Driving                     Extremity/Trunk Assessment   Upper Extremity Assessment Upper Extremity Assessment: Overall WFL for tasks assessed    Lower Extremity Assessment Lower Extremity Assessment:  LLE deficits/detail LLE Deficits / Details: deficits consistent with post op pain and weakness    Cervical / Trunk Assessment Cervical / Trunk Assessment: Normal  Communication   Communication Communication: No apparent difficulties    Cognition Arousal: Alert Behavior During Therapy: WFL for tasks assessed/performed   PT - Cognitive impairments: No apparent impairments                         Following commands: Intact       Cueing Cueing Techniques: Verbal cues     General Comments      Exercises     Assessment/Plan    PT Assessment Patient needs continued PT services  PT Problem List Decreased strength;Decreased activity tolerance;Decreased balance;Decreased mobility;Pain       PT Treatment Interventions DME instruction;Gait training;Stair training;Functional mobility training;Therapeutic activities;Therapeutic exercise;Balance training;Patient/family education    PT Goals (Current goals can be found in the Care Plan section)  Acute Rehab PT Goals Patient Stated Goal: to go home PT Goal Formulation: With patient Time For Goal Achievement: 10/09/24 Potential to Achieve Goals: Good    Frequency BID     Co-evaluation               AM-PAC PT 6 Clicks Mobility  Outcome Measure Help needed turning from your back to your side while in a flat bed without using bedrails?: None Help needed moving from lying on your back to sitting on the side of a flat bed without using bedrails?: None Help needed moving to and from a bed to a chair (including a wheelchair)?: A Little Help needed standing up from a chair using your arms (e.g., wheelchair or bedside chair)?: A Little Help needed to walk in hospital room?: A Little Help needed climbing 3-5 steps with a railing? : A Little 6 Click Score: 20    End of Session   Activity Tolerance: Patient tolerated treatment well Patient left: with call bell/phone within reach;in chair Nurse Communication:  Mobility status PT Visit Diagnosis: Muscle weakness (generalized) (M62.81);Other abnormalities of gait and mobility (R26.89)    Time: 9148-9087 PT Time Calculation (min) (ACUTE ONLY): 21 min   Charges:   PT Evaluation $PT Eval Low Complexity: 1 Low PT Treatments $Therapeutic Exercise: 8-22 mins PT General Charges $$ ACUTE PT VISIT: 1 Visit         Maryanne Finder, PT, DPT Physical Therapist - [facility]  [facility]   Adetokunbo Mccadden A Andria Head 09/25/2024, 12:04 PM

## 2024-10-10 ENCOUNTER — Ambulatory Visit: Admitting: Dermatology

## 2024-10-10 DIAGNOSIS — Z96652 Presence of left artificial knee joint: Secondary | ICD-10-CM | POA: Diagnosis not present

## 2024-10-10 DIAGNOSIS — M25562 Pain in left knee: Secondary | ICD-10-CM | POA: Diagnosis not present

## 2024-10-15 ENCOUNTER — Other Ambulatory Visit: Payer: Self-pay | Admitting: Family Medicine

## 2024-10-15 DIAGNOSIS — I10 Essential (primary) hypertension: Secondary | ICD-10-CM

## 2024-10-16 ENCOUNTER — Ambulatory Visit: Admitting: Dermatology

## 2024-10-16 DIAGNOSIS — L281 Prurigo nodularis: Secondary | ICD-10-CM | POA: Diagnosis not present

## 2024-10-16 DIAGNOSIS — L820 Inflamed seborrheic keratosis: Secondary | ICD-10-CM | POA: Diagnosis not present

## 2024-10-16 DIAGNOSIS — L570 Actinic keratosis: Secondary | ICD-10-CM

## 2024-10-16 DIAGNOSIS — D692 Other nonthrombocytopenic purpura: Secondary | ICD-10-CM | POA: Diagnosis not present

## 2024-10-16 DIAGNOSIS — W908XXA Exposure to other nonionizing radiation, initial encounter: Secondary | ICD-10-CM | POA: Diagnosis not present

## 2024-10-16 DIAGNOSIS — C44619 Basal cell carcinoma of skin of left upper limb, including shoulder: Secondary | ICD-10-CM | POA: Diagnosis not present

## 2024-10-16 DIAGNOSIS — L280 Lichen simplex chronicus: Secondary | ICD-10-CM | POA: Diagnosis not present

## 2024-10-16 DIAGNOSIS — D489 Neoplasm of uncertain behavior, unspecified: Secondary | ICD-10-CM | POA: Diagnosis not present

## 2024-10-16 DIAGNOSIS — L578 Other skin changes due to chronic exposure to nonionizing radiation: Secondary | ICD-10-CM | POA: Diagnosis not present

## 2024-10-16 DIAGNOSIS — L219 Seborrheic dermatitis, unspecified: Secondary | ICD-10-CM | POA: Diagnosis not present

## 2024-10-16 DIAGNOSIS — Z1283 Encounter for screening for malignant neoplasm of skin: Secondary | ICD-10-CM | POA: Diagnosis not present

## 2024-10-16 DIAGNOSIS — L814 Other melanin hyperpigmentation: Secondary | ICD-10-CM | POA: Diagnosis not present

## 2024-10-16 MED ORDER — KETOCONAZOLE 2 % EX SHAM: MEDICATED_SHAMPOO | CUTANEOUS | 11 refills | Status: AC

## 2024-10-16 MED ORDER — MUPIROCIN 2 % EX OINT: 1.0000 | TOPICAL_OINTMENT | Freq: Two times a day (BID) | CUTANEOUS | 6 refills | Status: AC

## 2024-10-16 NOTE — Patient Instructions (Addendum)
 Biopsy Wound Care Instructions  Leave the original bandage on for 24 hours if possible.  If the bandage becomes soaked or soiled before that time, it is OK to remove it and examine the wound.  A small amount of post-operative bleeding is normal.  If excessive bleeding occurs, remove the bandage, place gauze over the site and apply continuous pressure (no peeking) over the area for 30 minutes. If this does not work, please call our clinic as soon as possible or page your doctor if it is after hours.   Once a day, cleanse the wound with soap and water. It is fine to shower. If a thick crust develops you may use a Q-tip dipped into dilute hydrogen peroxide (mix 1:1 with water) to dissolve it.  Hydrogen peroxide can slow the healing process, so use it only as needed.    After washing, apply petroleum jelly (Vaseline) or an antibiotic ointment if your doctor prescribed one for you, followed by a bandage.    For best healing, the wound should be covered with a layer of ointment at all times. If you are not able to keep the area covered with a bandage to hold the ointment in place, this may mean re-applying the ointment several times a day.  Continue this wound care until the wound has healed and is no longer open.   Itching and mild discomfort is normal during the healing process. However, if you develop pain or severe itching, please call our office.   If you have any discomfort, you can take Tylenol  (acetaminophen ) or ibuprofen  as directed on the bottle. (Please do not take these if you have an allergy to them or cannot take them for another reason).  Some redness, tenderness and white or yellow material in the wound is normal healing.  If the area becomes very sore and red, or develops a thick yellow-green material (pus), it may be infected; please notify us .    If you have stitches, return to clinic as directed to have the stitches removed. You will continue wound care for 2-3 days after the stitches  are removed.   Wound healing continues for up to one year following surgery. It is not unusual to experience pain in the scar from time to time during the interval.  If the pain becomes severe or the scar thickens, you should notify the office.    A slight amount of redness in a scar is expected for the first six months.  After six months, the redness will fade and the scar will soften and fade.  The color difference becomes less noticeable with time.  If there are any problems, return for a post-op surgery check at your earliest convenience.  To improve the appearance of the scar, you can use silicone scar gel, cream, or sheets (such as Mederma or Serica) every night for up to one year. These are available over the counter (without a prescription).  Please call our office at [phone] for any questions or concerns.    Actinic keratoses are precancerous spots that appear secondary to cumulative UV radiation exposure/sun exposure over time. They are chronic with expected duration over 1 year. A portion of actinic keratoses will progress to squamous cell carcinoma of the skin. It is not possible to reliably predict which spots will progress to skin cancer and so treatment is recommended to prevent development of skin cancer.  Recommend daily broad spectrum sunscreen SPF 30+ to sun-exposed areas, reapply every 2 hours as needed.  Recommend staying in the shade or wearing long sleeves, sun glasses (UVA+UVB protection) and wide brim hats (4-inch brim around the entire circumference of the hat). Call for new or changing lesions.    Cryotherapy Aftercare  Wash gently with soap and water everyday.   Apply Vaseline and Band-Aid daily until healed.     Seborrheic Keratosis  What causes seborrheic keratoses? Seborrheic keratoses are harmless, common skin growths that first appear during adult life.  As time goes by, more growths appear.  Some people may develop a large number of them.   Seborrheic keratoses appear on both covered and uncovered body parts.  They are not caused by sunlight.  The tendency to develop seborrheic keratoses can be inherited.  They vary in color from skin-colored to gray, brown, or even black.  They can be either smooth or have a rough, warty surface.   Seborrheic keratoses are superficial and look as if they were stuck on the skin.  Under the microscope this type of keratosis looks like layers upon layers of skin.  That is why at times the top layer may seem to fall off, but the rest of the growth remains and re-grows.    Treatment Seborrheic keratoses do not need to be treated, but can easily be removed in the office.  Seborrheic keratoses often cause symptoms when they rub on clothing or jewelry.  Lesions can be in the way of shaving.  If they become inflamed, they can cause itching, soreness, or burning.  Removal of a seborrheic keratosis can be accomplished by freezing, burning, or surgery. If any spot bleeds, scabs, or grows rapidly, please return to have it checked, as these can be an indication of a skin cancer.    Melanoma ABCDEs  Melanoma is the most dangerous type of skin cancer, and is the leading cause of death from skin disease.  You are more likely to develop melanoma if you: Have light-colored skin, light-colored eyes, or red or blond hair Spend a lot of time in the sun Tan regularly, either outdoors or in a tanning bed Have had blistering sunburns, especially during childhood Have a close family member who has had a melanoma Have atypical moles or large birthmarks  Early detection of melanoma is key since treatment is typically straightforward and cure rates are extremely high if we catch it early.   The first sign of melanoma is often a change in a mole or a new dark spot.  The ABCDE system is a way of remembering the signs of melanoma.  A for asymmetry:  The two halves do not match. B for border:  The edges of the growth are  irregular. C for color:  A mixture of colors are present instead of an even brown color. D for diameter:  Melanomas are usually (but not always) greater than 6mm - the size of a pencil eraser. E for evolution:  The spot keeps changing in size, shape, and color.  Please check your skin once per month between visits. You can use a small mirror in front and a large mirror behind you to keep an eye on the back side or your body.   If you see any new or changing lesions before your next follow-up, please call to schedule a visit.  Please continue daily skin protection including broad spectrum sunscreen SPF 30+ to sun-exposed areas, reapplying every 2 hours as needed when you're outdoors.   Staying in the shade or wearing long sleeves, sun glasses (UVA+UVB  protection) and wide brim hats (4-inch brim around the entire circumference of the hat) are also recommended for sun protection.    Due to recent changes in healthcare laws, you may see results of your pathology and/or laboratory studies on MyChart before the doctors have had a chance to review them. We understand that in some cases there may be results that are confusing or concerning to you. Please understand that not all results are received at the same time and often the doctors may need to interpret multiple results in order to provide you with the best plan of care or course of treatment. Therefore, we ask that you please give us  2 business days to thoroughly review all your results before contacting the office for clarification. Should we see a critical lab result, you will be contacted sooner.   If You Need Anything After Your Visit  If you have any questions or concerns for your doctor, please call our main line at [phone] and press option 4 to reach your doctor's medical assistant. If no one answers, please leave a voicemail as directed and we will return your call as soon as possible. Messages left after 4 pm will be answered the  following business day.   You may also send us  a message via MyChart. We typically respond to MyChart messages within 1-2 business days.  For prescription refills, please ask your pharmacy to contact our office. Our fax number is [phone].  If you have an urgent issue when the clinic is closed that cannot wait until the next business day, you can page your doctor at the number below.    Please note that while we do our best to be available for urgent issues outside of office hours, we are not available 24/7.   If you have an urgent issue and are unable to reach us , you may choose to seek medical care at your doctor's office, retail clinic, urgent care center, or emergency room.  If you have a medical emergency, please immediately call 911 or go to the emergency department.  Pager Numbers  - Dr. Hester: [phone]  - Dr. Jackquline: [phone]  - Dr. Claudene: [phone]   - Dr. Raymund: [phone]  In the event of inclement weather, please call our main line at [phone] for an update on the status of any delays or closures.  Dermatology Medication Tips: Please keep the boxes that topical medications come in in order to help keep track of the instructions about where and how to use these. Pharmacies typically print the medication instructions only on the boxes and not directly on the medication tubes.   If your medication is too expensive, please contact our office at [phone] option 4 or send us  a message through MyChart.   We are unable to tell what your co-pay for medications will be in advance as this is different depending on your insurance coverage. However, we may be able to find a substitute medication at lower cost or fill out paperwork to get insurance to cover a needed medication.   If a prior authorization is required to get your medication covered by your insurance company, please allow us  1-2 business days to complete this process.  Drug prices often vary  depending on where the prescription is filled and some pharmacies may offer cheaper prices.  The website www.goodrx.com contains coupons for medications through different pharmacies. The prices here do not account for what the cost may be with help from insurance (it may be cheaper  with your insurance), but the website can give you the price if you did not use any insurance.  - You can print the associated coupon and take it with your prescription to the pharmacy.  - You may also stop by our office during regular business hours and pick up a GoodRx coupon card.  - If you need your prescription sent electronically to a different pharmacy, notify our office through [facility] or by phone at [phone] option 4.     Si Usted Necesita Algo Despus de Su Visita  Tambin puede enviarnos un mensaje a travs de Clinical cytogeneticist. Por lo general respondemos a los mensajes de MyChart en el transcurso de 1 a 2 das hbiles.  Para renovar recetas, por favor pida a su farmacia que se ponga en contacto con nuestra oficina. Randi lakes de fax es [place] [phone].  Si tiene un asunto urgente cuando la clnica est cerrada y que no puede esperar hasta el siguiente da hbil, puede llamar/localizar a su doctor(a) al nmero que aparece a continuacin.   Por favor, tenga en cuenta que aunque hacemos todo lo posible para estar disponibles para asuntos urgentes fuera del horario de [place], no estamos disponibles las 24 horas del da, los 7 [address]  [address] de la [place].   Si tiene un problema urgente y no puede comunicarse con nosotros, puede optar por buscar atencin mdica  en el consultorio de su doctor(a), en una clnica privada, en un centro de atencin urgente o en una sala de emergencias.  Si tiene Engineer, drilling, por favor llame inmediatamente al 911 o vaya a la sala de emergencias.  Nmeros de bper  - Dr. Hester: [phone]  - Dra. Jackquline: [phone]  - Dr. Claudene: [phone]  - Dra.  Kitts: [phone]  En caso de inclemencias del [place], por favor llame a nuestra lnea principal al [phone] para una actualizacin sobre el estado de cualquier retraso o cierre.  Consejos para la medicacin en dermatologa: Por favor, guarde las cajas en las que vienen los medicamentos de uso tpico para ayudarle a seguir las instrucciones sobre dnde y cmo usarlos. Las farmacias generalmente imprimen las instrucciones del medicamento slo en las cajas y no directamente en los tubos del [place].   Si su medicamento es muy caro, por favor, pngase en contacto con landry rieger llamando al [phone] y presione la opcin 4 o envenos un mensaje a travs de Clinical cytogeneticist.   No podemos decirle cul ser su copago por los medicamentos por adelantado ya que esto es diferente dependiendo de la cobertura de su seguro. Sin embargo, es posible que podamos encontrar un medicamento sustituto a Audiological scientist un formulario para que el seguro cubra el medicamento que se considera necesario.   Si se requiere una autorizacin previa para que su compaa de seguros malta su medicamento, por favor permtanos de 1 a 2 das hbiles para completar este proceso.  Los precios de los medicamentos varan con frecuencia dependiendo del Environmental consultant de dnde se surte la receta y alguna farmacias pueden ofrecer precios ms baratos.  El sitio web www.goodrx.com tiene cupones para medicamentos de Health and safety inspector. Los precios aqu no tienen en cuenta lo que podra costar con la ayuda del seguro (puede ser ms barato con su seguro), pero el sitio web puede darle el precio si no utiliz Tourist information centre manager.  - Puede imprimir el cupn correspondiente y llevarlo con su receta a la farmacia.  - Tambin puede pasar por nuestra oficina durante el horario de atencin  regular y recoger una tarjeta de cupones de GoodRx.  - Si necesita que su receta se enve electrnicamente a una farmacia diferente, informe a nuestra oficina a  travs de MyChart de [place] o por telfono llamando al [phone] y presione la opcin 4.

## 2024-10-16 NOTE — Progress Notes (Unsigned)
 Follow-Up Visit   Subjective  Brett Bender is a 77 y.o. male who presents for the following: Skin Cancer Screening and Full Body Skin Exam  6 month tbse hx of bcc, hx of aks, hx of isks, hx of seborrheic dermatitis, hx of lichen simplex chronicus / prurigo nodularis  Reports he did use 5 f/u calcipotriene cream twice daily for 7 days to temples and forehead in July 2025 and then again to chest in August 2025. Did get very red and inflamed.  Reports a spot at posterior left shoulder  that has been there for months and will not go away   The patient presents for Total-Body Skin Exam (TBSE) for skin cancer screening and mole check. The patient has spots, moles and lesions to be evaluated, some may be new or changing and the patient may have concern these could be cancer.    The following portions of the chart were reviewed this encounter and updated as appropriate: medications, allergies, medical history  Review of Systems:  No other skin or systemic complaints except as noted in HPI or Assessment and Plan.  Objective  Well appearing patient in no apparent distress; mood and affect are within normal limits.  A full examination was performed including scalp, head, eyes, ears, nose, lips, neck, chest, axillae, abdomen, back, buttocks, bilateral upper extremities, bilateral lower extremities, hands, feet, fingers, toes, fingernails, and toenails. All findings within normal limits unless otherwise noted below.   Relevant physical exam findings are noted in the Assessment and Plan.  face ears and chest x 15 (15) Erythematous thin papules/macules with gritty scale.  b/l arms x 6 (6) Erythematous stuck-on, waxy papule or plaque left superior posterior shoulder 2.1 cm indurated papule    Assessment & Plan   HISTORY OF BASAL CELL CARCINOMA OF THE SKIN.  09/15/2023 left superior posterior shoulder ED&C   03/17/2023 right inferior post auricular excised 04/19/2023   09/11/2020 - right mid  back  11/29/2018 - left upper back  02/03/2016 left lateral supraclavicular base of neck/ excision  11/09/2013 right lateral edge of clavicle   - No evidence of recurrence today - Recommend regular full body skin exams - Recommend daily broad spectrum sunscreen SPF 30+ to sun-exposed areas, reapply every 2 hours as needed.  - Call if any new or changing lesions are noted between office visits   SKIN CANCER SCREENING PERFORMED TODAY.  ACTINIC DAMAGE - Chronic condition, secondary to cumulative UV/sun exposure - diffuse scaly erythematous macules with underlying dyspigmentation - Recommend daily broad spectrum sunscreen SPF 30+ to sun-exposed areas, reapply every 2 hours as needed.  - Staying in the shade or wearing long sleeves, sun glasses (UVA+UVB protection) and wide brim hats (4-inch brim around the entire circumference of the hat) are also recommended for sun protection.  - Call for new or changing lesions.  LENTIGINES, SEBORRHEIC KERATOSES, HEMANGIOMAS - Benign normal skin lesions - Benign-appearing - Call for any changes  MELANOCYTIC NEVI - Tan-brown and/or pink-flesh-colored symmetric macules and papules - Benign appearing on exam today - Observation - Call clinic for new or changing moles - Recommend daily use of broad spectrum spf 30+ sunscreen to sun-exposed areas.    Purpura - Chronic; persistent and recurrent.  Treatable, but not curable. - Violaceous macules and patches - Benign - Related to trauma, age, sun damage and/or use of blood thinners, chronic use of topical and/or oral steroids - Observe - Can use OTC arnica containing moisturizer such as Dermend Bruise Formula  if desired - Call for worsening or other concerns  LICHEN SIMPLEX CHRONICUS/PRURIGO NODULARIS Exam: Excoriated lichenified papules and/or plaques at chest, arms and 2 x excoriations at left neck   Chronic and persistent condition with duration or expected duration over one year. Condition is  symptomatic/ bothersome to patient. Not currently at goal. Lichen simplex chronicus [facility]) is a persistent itchy area of thickened skin that is induced by chronic rubbing and/or scratching (chronic dermatitis).  These areas may be pink, hyperpigmented and may have excoriations and bumps (prurigo nodules- PN).  LSC/PN is commonly observed in uncontrolled atopic dermatitis and other forms of eczema, and in other itchy skin conditions (eg, insect bites, scabies).  Sometimes it is not possible to know initial cause of LSC/PN if it has been present for a long time.  It generally responds well to treatment with high potency topical steroids.  It is important to stop rubbing/scratching the area in order to break the itch-scratch-rash-itch cycle, in order for the rash to resolve.  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  He and his doctors who diagnosed the osteomyelitis feel that it started as a sore that he picked resulting in sepsis and subsequent osteomyelitis.  He now is more conscientious of picking at spots.  SEBORRHEIC DERMATITIS Exam: Pink patches with greasy scale at scalp   Chronic and persistent condition with duration or expected duration over one year. Condition is symptomatic/ bothersome to patient. Not currently at goal.   Seborrheic Dermatitis is a chronic persistent rash characterized by pinkness and scaling most commonly of the mid face but also can occur on the scalp (dandruff), ears; mid chest, mid back and groin.  It tends to be exacerbated by stress and cooler weather.  People who have neurologic disease may experience new onset or exacerbation of existing seborrheic dermatitis.  The condition is not curable but treatable and can be controlled.   Treatment  Plan: Continue Ketoconazole  2% shampoo as directed.     ACTINIC KERATOSIS (15) face ears and chest x 15 (15) Actinic keratoses are precancerous spots that appear secondary to cumulative UV radiation exposure/sun exposure over time. They are chronic with expected duration over 1 year. A portion of actinic keratoses will progress to squamous cell carcinoma of the skin. It is not possible to reliably predict which spots will progress to skin cancer and so treatment is recommended to prevent development of skin cancer.  Recommend daily broad spectrum sunscreen SPF 30+ to sun-exposed areas, reapply every 2 hours as needed.  Recommend staying in the shade or wearing long sleeves, sun glasses (UVA+UVB protection) and wide brim hats (4-inch brim around the entire circumference of the hat). Call for new or changing lesions. Destruction of lesion - face ears and chest x 15 (15) Complexity: simple   Destruction method: cryotherapy   Informed consent: discussed and consent obtained   Timeout:  patient name, date of birth, surgical site, and procedure verified Lesion destroyed using liquid nitrogen: Yes   Region frozen until ice ball extended beyond lesion: Yes   Outcome: patient tolerated procedure well with no complications   Post-procedure details: wound care instructions given    INFLAMED SEBORRHEIC KERATOSIS (6) b/l arms x 6 (6) Symptomatic, irritating, patient would like treated. Destruction of lesion -  b/l arms x 6 (6) Complexity: simple   Destruction method: cryotherapy   Informed consent: discussed and consent obtained   Timeout:  patient name, date of birth, surgical site, and procedure verified Lesion destroyed using liquid nitrogen: Yes   Region frozen until ice ball extended beyond lesion: Yes   Outcome: patient tolerated procedure well with no complications   Post-procedure details: wound care instructions given    NEOPLASM OF UNCERTAIN BEHAVIOR left superior posterior  shoulder Skin / nail biopsy Type of biopsy: tangential   Informed consent: discussed and consent obtained   Timeout: patient name, date of birth, surgical site, and procedure verified   Procedure prep:  Patient was prepped and draped in usual sterile fashion Prep type:  Isopropyl alcohol Anesthesia: the lesion was anesthetized in a standard fashion   Anesthetic:  1% lidocaine  w/ epinephrine  1-100,000 buffered w/ 8.4% NaHCO3 Instrument used: flexible razor blade   Hemostasis achieved with: pressure, aluminum chloride and electrodesiccation   Outcome: patient tolerated procedure well   Post-procedure details: sterile dressing applied and wound care instructions given   Dressing type: petrolatum and bandage    Specimen 1 - Surgical pathology Differential Diagnosis: r/o recurrent BCC See previous pathology  09/15/2023 Accession: [id number] Check Margins: Yes  R/o recurrent bcc   See previous pathology  Accession: [id number]  Discussed if comes back positive for recurrent BCC recommend Mohs surgery with Dr. Corey  Patient in agreement  SEBORRHEIC DERMATITIS   Related Medications ketoconazole  (NIZORAL ) 2 % shampoo 3 times weekly lather on scalp, leave on 5-8 minutes, rinse well. NEURODERMATITIS   Related Medications mupirocin  ointment (BACTROBAN ) 2 % Apply 1 Application topically 2 (two) times daily. Apply to any cuts, scrapes, open wounds, sores until healed Return in about 6 months (around 04/16/2025) for TBSE.  IEleanor Blush, CMA, am acting as scribe for Alm Rhyme, MD.   Documentation: I have reviewed the above documentation for accuracy and completeness, and I agree with the above.  Alm Rhyme, MD

## 2024-10-17 ENCOUNTER — Encounter: Payer: Self-pay | Admitting: Dermatology

## 2024-10-17 DIAGNOSIS — M25562 Pain in left knee: Secondary | ICD-10-CM | POA: Diagnosis not present

## 2024-10-17 DIAGNOSIS — Z96652 Presence of left artificial knee joint: Secondary | ICD-10-CM | POA: Diagnosis not present

## 2024-10-19 DIAGNOSIS — M25562 Pain in left knee: Secondary | ICD-10-CM | POA: Diagnosis not present

## 2024-10-19 DIAGNOSIS — Z96652 Presence of left artificial knee joint: Secondary | ICD-10-CM | POA: Diagnosis not present

## 2024-10-22 ENCOUNTER — Ambulatory Visit: Payer: Self-pay | Admitting: Dermatology

## 2024-10-22 DIAGNOSIS — C44619 Basal cell carcinoma of skin of left upper limb, including shoulder: Secondary | ICD-10-CM

## 2024-10-22 LAB — SURGICAL PATHOLOGY

## 2024-10-23 ENCOUNTER — Encounter: Payer: Self-pay | Admitting: Dermatology

## 2024-10-23 NOTE — Telephone Encounter (Addendum)
 Called and discussed results with patient. Verbalized understanding. Patient prefers Mohs in [place] with Dr. Corey.  Referral sent to Dr. Corey  ----- Message from Alm Rhyme, MD sent at 10/22/2024  6:03 PM EST ----- FINAL DIAGNOSIS        1. Skin, left superior posterior shoulder :       BASAL CELL CARCINOMA, NODULAR PATTERN, PERIPHERAL AND DEEP MARGINS INVOLVED    Cancer = BCC Recurrent Schedule for MOHS

## 2024-10-30 ENCOUNTER — Other Ambulatory Visit: Payer: Self-pay | Admitting: Family Medicine

## 2024-11-19 ENCOUNTER — Encounter: Payer: Self-pay | Admitting: Dermatology

## 2024-11-20 ENCOUNTER — Ambulatory Visit: Admitting: Dermatology

## 2024-11-20 ENCOUNTER — Encounter: Payer: Self-pay | Admitting: Dermatology

## 2024-11-20 VITALS — BP 134/77 | HR 65 | Temp 98.1°F

## 2024-11-20 DIAGNOSIS — L98459 Non-pressure chronic ulcer of neck with unspecified severity: Secondary | ICD-10-CM

## 2024-11-20 DIAGNOSIS — D485 Neoplasm of uncertain behavior of skin: Secondary | ICD-10-CM | POA: Diagnosis not present

## 2024-11-20 DIAGNOSIS — L738 Other specified follicular disorders: Secondary | ICD-10-CM | POA: Diagnosis not present

## 2024-11-20 DIAGNOSIS — L814 Other melanin hyperpigmentation: Secondary | ICD-10-CM | POA: Diagnosis not present

## 2024-11-20 DIAGNOSIS — L578 Other skin changes due to chronic exposure to nonionizing radiation: Secondary | ICD-10-CM

## 2024-11-20 DIAGNOSIS — C4491 Basal cell carcinoma of skin, unspecified: Secondary | ICD-10-CM

## 2024-11-20 DIAGNOSIS — C44519 Basal cell carcinoma of skin of other part of trunk: Secondary | ICD-10-CM | POA: Diagnosis not present

## 2024-11-20 NOTE — Patient Instructions (Signed)

## 2024-11-20 NOTE — Progress Notes (Signed)
 "  Follow-Up Visit   Subjective  Brett Bender is a 79 y.o. 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 Hester.   The following portions of the chart were reviewed this encounter and updated as appropriate: medications, allergies, medical history  Review of Systems:  No other skin or systemic complaints except as noted in HPI or Assessment and Plan.  Objective  Well appearing patient in no apparent distress; mood and affect are within normal limits.  A focused examination was performed of the following areas: Left superior posterior shoulder Left neck Relevant physical exam findings are noted in the Assessment and Plan.   left superior posterior shoulder Pink scaly plaque with adjacent scar  Left Posterior Neck 8mm Ulcerated papule   Assessment & Plan   BASAL CELL CARCINOMA (BCC), UNSPECIFIED SITE left superior posterior shoulder - Mohs surgery  Consent obtained: written  Anticoagulation: Is the patient taking prescription anticoagulant and/or aspirin  prescribed/recommended by a physician? No   Was the anticoagulation regimen changed prior to Mohs? No    Anesthesia: Anesthesia method: local infiltration Local anesthetic: lidocaine  1% WITH epi  Procedure Details: Timeout: pre-procedure verification complete Procedure Prep: patient was prepped and draped in usual sterile fashion Prep type: chlorhexidine  Biopsy accession number: [id number] Pre-Op diagnosis: basal cell carcinoma BCC subtype: nodular MohsAIQ Surgical site (if tumor spans multiple areas, please select predominant area): trunk (excluding nipple/areola) Surgery side: left Surgical site (from skin exam): left superior posterior shoulder Pre-operative length (cm): 3 Pre-operative width (cm): 1.7 Indications for Mohs surgery: anatomic location where tissue conservation is critical, tumor size greater  than 2 cm and recurrence  Micrographic Surgery Details: Post-operative length (cm): 5 Post-operative width (cm): 2.8 Number of Mohs stages: 2 Post surgery depth of defect: subcutaneous fat  Stage 1    Tumor features identified on Mohs section: basal carcinoma    Depth of tumor invasion after stage: dermis and subcutaneous fat  Stage 2    Tumor features identified on Mohs section: no tumor identified    Depth of tumor invasion after stage: subcutaneous fat  Reconstruction: Was the defect reconstructed? Yes   Was reconstruction performed by the same Mohs surgeon? Yes   Setting of reconstruction: outpatient office When was reconstruction performed? same day Type of reconstruction: linear Linear reconstruction: complex  - Skin repair Complexity:  Complex Final length (cm):  11 Informed consent: discussed and consent obtained   Timeout: patient name, date of birth, surgical site, and procedure verified   Procedure prep:  Patient was prepped and draped in usual sterile fashion Prep type:  Chlorhexidine  Anesthesia: the lesion was anesthetized in a standard fashion   Anesthetic:  1% lidocaine  w/ epinephrine  1-100,000 buffered w/ 8.4% NaHCO3 Reason for type of repair: reduce tension to allow closure, reduce the risk of dehiscence, infection, and necrosis, reduce subcutaneous dead space and avoid a hematoma, allow closure of the large defect and avoid adjacent structures   Undermining: area extensively undermined   Subcutaneous layers (deep stitches):  Suture size:  2-0 (with dermabond and steri strips) Suture type: PDS (polydioxanone)   Stitches:  Buried vertical mattress Fine/surface layer approximation (top stitches):  Suture type: cyanoacrylate tissue glue   Hemostasis achieved with: suture, pressure and electrodesiccation Outcome: patient tolerated procedure well with no complications   Post-procedure details: sterile dressing applied and wound care instructions given    Dressing type: bandage and pressure dressing  NEOPLASM OF UNCERTAIN BEHAVIOR OF SKIN Left Posterior Neck - Skin / nail biopsy Type of biopsy: tangential   Timeout: patient name, date of birth, surgical site, and procedure verified   Procedure prep:  Patient was prepped and draped in usual sterile fashion Prep type:  Isopropyl alcohol Anesthesia: the lesion was anesthetized in a standard fashion   Anesthetic:  1% lidocaine  w/ epinephrine  1-100,000 buffered w/ 8.4% NaHCO3 Instrument used: DermaBlade   Hemostasis achieved with: electrodesiccation   Outcome: patient tolerated procedure well   Post-procedure details: sterile dressing applied and wound care instructions given   Dressing type: bandage and pressure dressing    Specimen 1 - Surgical pathology Differential Diagnosis: R/O NMNSC  Check Margins: No  Neoplasm of Skin- left posterior neck- Pink papule with hemorrhagic crusting- Ddx NMSC vs other - Reached out to Dr. Lionell team, permission give to proceed with biopsy - Will proceed with shave biopsy today  Return in about 4 weeks (around 12/18/2024) for wound check.  Brett Bender, CMA, am acting as scribe for RUFUS CHRISTELLA HOLY, MD.    11/20/2024  HISTORY OF PRESENT ILLNESS  Brett Bender is seen in consultation at the request of Dr. Hester for biopsy-proven Recurrent Nodular Basal Cell Carcinoma of the left posterior shoulder. They note that the area has been present for about 1-2 years increasing in size with time. Previously treated. Reports no other new or changing lesions and has no other complaints today.  Medications and allergies: see patient chart.  Review of systems: Reviewed 8 systems and notable for the above skin cancer.  All other systems reviewed are unremarkable/negative, unless noted in the HPI. Past medical history, surgical history, family history, social history were also reviewed and are noted in the chart/questionnaire.    PHYSICAL  EXAMINATION  General: Well-appearing, in no acute distress, alert and oriented x 4. Vitals reviewed in chart (if available).   Skin: Exam reveals a 3.0 x 1.7 cm erythematous papule and biopsy scar on the left superior posterior shoulder. There are rhytids, telangiectasias, and lentigines, consistent with photodamage.  Biopsy report(s) reviewed, confirming the diagnosis.   ASSESSMENT  1) Recurrent Nodular Basal Cell Carcinoma of the left superior posterior shoulder 2) photodamage 3) solar lentigines   PLAN   1. Due to location, size, histology, or recurrence and the likelihood of subclinical extension as well as the need to conserve normal surrounding tissue, the patient was deemed acceptable for Mohs micrographic surgery (MMS).  The nature and purpose of the procedure, associated benefits and risks including recurrence and scarring, possible complications such as pain, infection, and bleeding, and alternative methods of treatment if appropriate were discussed with the patient during consent. The lesion location was verified by the patient, by reviewing previous notes, pathology reports, and by photographs as well as angulation measurements if available.  Informed consent was reviewed and signed by the patient, and timeout was performed at 8:15 AM. See op note below.  2. For the photodamage and solar lentigines, sun protection discussed/information given on OTC sunscreens, and we recommend continued regular follow-up with primary dermatologist every 6 months or sooner for any growing, bleeding, or changing lesions. 3. Prognosis and future surveillance discussed. 4. Letter with treatment outcome sent to referring provider. 5. Pain acetaminophen /ibuprofen   MOHS MICROGRAPHIC SURGERY AND RECONSTRUCTION  Initial size:   3.0 x 1.7 cm Surgical defect/wound size: 5.0 x 2.8 cm Anesthesia:    0.33% lidocaine  with 1:200,000 epinephrine  EBL:    <5 mL Complications:  None Repair type:   Complex SQ  suture:   2-0 PDS Cutaneous suture:  Cyanoacrylate and Steristrips Final size of the repair: 11.0 cm  Stages: 2  STAGE I: Anesthesia achieved with 0.5% lidocaine  with 1:200,000 epinephrine . ChloraPrep applied. 2 section(s) excised using Mohs technique (this includes total peripheral and deep tissue margin excision and evaluation with frozen sections, excised and interpreted by the same physician). The tumor was first debulked and then excised with an approx. 2 mm margin.  Hemostasis was achieved with electrocautery as needed.  The specimen was then oriented, subdivided/relaxed, inked, and processed using Mohs technique.    Frozen section analysis revealed a positive margin for thin cords or strands of basaloid tumor cells that deeply infiltrate the surrounding stroma, often with irregular, angulated edges, appearing as a permeating invasion pattern at the tumor margins; these strands are embedded within a dense, collagenous stroma, with less prominent peripheral palisading and retraction in the deep margin.    STAGE II: An additional 2 mm margin was excised.  Hemostasis was achieved with electrocautery as needed.  The specimen was then oriented, subdivided/relaxed, inked, and processed using Mohs technique. Evaluation of slides by the Mohs surgeon revealed clear tumor margins.  Reconstruction  The surgical wound was then cleaned, prepped, and re-anesthetized as above. Wound edges were undermined extensively along at least one entire edge and at a distance equal to or greater than the width of the defect (see wound defect size above) in order to achieve closure and decrease wound tension and anatomic distortion. Redundant tissue repair including standing cone removal was performed. Hemostasis was achieved with electrocautery. Subcutaneous and epidermal tissues were approximated with the above sutures. The surgical site was then lightly scrubbed with sterile, saline-soaked gauze. Steri-strips were  applied, and the area was then bandaged using Vaseline ointment, non-adherent gauze, gauze pads, and tape to provide an adequate pressure dressing. The patient tolerated the procedure well, was given detailed written and verbal wound care instructions, and was discharged in good condition.   The patient will follow-up: 4 weeks.    Documentation: I have reviewed the above documentation for accuracy and completeness, and I agree with the above.  RUFUS CHRISTELLA HOLY, MD  "

## 2024-11-22 ENCOUNTER — Other Ambulatory Visit: Payer: Self-pay | Admitting: Family Medicine

## 2024-11-22 DIAGNOSIS — M1990 Unspecified osteoarthritis, unspecified site: Secondary | ICD-10-CM

## 2024-11-22 LAB — SURGICAL PATHOLOGY

## 2024-11-23 ENCOUNTER — Ambulatory Visit: Payer: Self-pay | Admitting: Dermatology

## 2024-12-20 ENCOUNTER — Ambulatory Visit: Admitting: Dermatology

## 2025-01-29 ENCOUNTER — Ambulatory Visit

## 2025-04-17 ENCOUNTER — Ambulatory Visit: Admitting: Dermatology

## 2025-07-08 ENCOUNTER — Ambulatory Visit: Admitting: Urology
